# Patient Record
Sex: Female | Born: 1965 | Race: White | Hispanic: No | Marital: Married | Smoking: Never smoker
Health system: Southern US, Community
[De-identification: ages and names within clinical notes are randomized; demographics above are authoritative.]

## PROBLEM LIST (undated history)

## (undated) DIAGNOSIS — G629 Polyneuropathy, unspecified: Secondary | ICD-10-CM

## (undated) DIAGNOSIS — F319 Bipolar disorder, unspecified: Secondary | ICD-10-CM

## (undated) DIAGNOSIS — F329 Major depressive disorder, single episode, unspecified: Secondary | ICD-10-CM

## (undated) DIAGNOSIS — K311 Adult hypertrophic pyloric stenosis: Secondary | ICD-10-CM

## (undated) DIAGNOSIS — K9189 Other postprocedural complications and disorders of digestive system: Secondary | ICD-10-CM

## (undated) DIAGNOSIS — E43 Unspecified severe protein-calorie malnutrition: Secondary | ICD-10-CM

## (undated) DIAGNOSIS — R1110 Vomiting, unspecified: Secondary | ICD-10-CM

## (undated) DIAGNOSIS — E538 Deficiency of other specified B group vitamins: Secondary | ICD-10-CM

## (undated) DIAGNOSIS — E559 Vitamin D deficiency, unspecified: Secondary | ICD-10-CM

## (undated) DIAGNOSIS — R7303 Prediabetes: Secondary | ICD-10-CM

## (undated) DIAGNOSIS — Z8711 Personal history of peptic ulcer disease: Secondary | ICD-10-CM

## (undated) DIAGNOSIS — K315 Obstruction of duodenum: Secondary | ICD-10-CM

## (undated) DIAGNOSIS — M545 Low back pain, unspecified: Secondary | ICD-10-CM

## (undated) DIAGNOSIS — D75839 Thrombocytosis, unspecified: Secondary | ICD-10-CM

## (undated) DIAGNOSIS — G8929 Other chronic pain: Secondary | ICD-10-CM

## (undated) DIAGNOSIS — K257 Chronic gastric ulcer without hemorrhage or perforation: Secondary | ICD-10-CM

## (undated) DIAGNOSIS — K264 Chronic or unspecified duodenal ulcer with hemorrhage: Secondary | ICD-10-CM

## (undated) DIAGNOSIS — F4310 Post-traumatic stress disorder, unspecified: Secondary | ICD-10-CM

## (undated) DIAGNOSIS — E039 Hypothyroidism, unspecified: Secondary | ICD-10-CM

## (undated) DIAGNOSIS — F32A Depression, unspecified: Secondary | ICD-10-CM

## (undated) DIAGNOSIS — G43909 Migraine, unspecified, not intractable, without status migrainosus: Secondary | ICD-10-CM

## (undated) DIAGNOSIS — K432 Incisional hernia without obstruction or gangrene: Secondary | ICD-10-CM

## (undated) DIAGNOSIS — K219 Gastro-esophageal reflux disease without esophagitis: Secondary | ICD-10-CM

## (undated) DIAGNOSIS — Z8719 Personal history of other diseases of the digestive system: Secondary | ICD-10-CM

## (undated) DIAGNOSIS — N200 Calculus of kidney: Secondary | ICD-10-CM

## (undated) DIAGNOSIS — D509 Iron deficiency anemia, unspecified: Secondary | ICD-10-CM

## (undated) DIAGNOSIS — G894 Chronic pain syndrome: Secondary | ICD-10-CM

## (undated) HISTORY — DX: Migraine, unspecified, not intractable, without status migrainosus: G43.909

## (undated) HISTORY — DX: Chronic or unspecified duodenal ulcer with hemorrhage: K26.4

## (undated) HISTORY — DX: Polyneuropathy, unspecified: G62.9

## (undated) HISTORY — DX: Adult hypertrophic pyloric stenosis: K31.1

## (undated) HISTORY — DX: Obstruction of duodenum: K31.5

## (undated) HISTORY — DX: Depression, unspecified: F32.A

## (undated) HISTORY — PX: CHOLECYSTECTOMY: SHX55

## (undated) HISTORY — DX: Hypothyroidism, unspecified: E03.9

## (undated) HISTORY — DX: Chronic gastric ulcer without hemorrhage or perforation: K25.7

## (undated) HISTORY — PX: OTHER SURGICAL HISTORY: SHX169

## (undated) HISTORY — DX: Incisional hernia without obstruction or gangrene: K43.2

## (undated) HISTORY — DX: Vomiting, unspecified: R11.10

## (undated) HISTORY — DX: Major depressive disorder, single episode, unspecified: F32.9

## (undated) HISTORY — DX: Gastro-esophageal reflux disease without esophagitis: K21.9

## (undated) HISTORY — DX: Calculus of kidney: N20.0

## (undated) HISTORY — PX: TOOTH EXTRACTION: SUR596

---
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
## 2002-03-08 HISTORY — PX: ABDOMINAL HYSTERECTOMY: SHX81

## 2004-06-02 ENCOUNTER — Inpatient Hospital Stay: Payer: Self-pay | Admitting: Internal Medicine

## 2004-06-25 ENCOUNTER — Ambulatory Visit: Payer: Self-pay | Admitting: Urology

## 2005-09-23 ENCOUNTER — Ambulatory Visit: Payer: Self-pay | Admitting: Gastroenterology

## 2005-11-30 ENCOUNTER — Ambulatory Visit: Payer: Self-pay | Admitting: Gastroenterology

## 2006-01-17 ENCOUNTER — Ambulatory Visit: Payer: Self-pay | Admitting: Internal Medicine

## 2010-11-27 ENCOUNTER — Emergency Department: Payer: Self-pay | Admitting: Emergency Medicine

## 2013-03-22 ENCOUNTER — Encounter: Payer: Self-pay | Admitting: Family Medicine

## 2013-03-22 ENCOUNTER — Ambulatory Visit (INDEPENDENT_AMBULATORY_CARE_PROVIDER_SITE_OTHER): Payer: BC Managed Care – PPO | Admitting: Family Medicine

## 2013-03-22 ENCOUNTER — Other Ambulatory Visit (HOSPITAL_COMMUNITY)
Admission: RE | Admit: 2013-03-22 | Discharge: 2013-03-22 | Disposition: A | Discharge status: Home / Self Care | Payer: BC Managed Care – PPO | Source: Ambulatory Visit | Attending: Family Medicine | Admitting: Family Medicine

## 2013-03-22 VITALS — BP 104/68 | HR 84 | Temp 97.7°F | Ht 59.5 in | Wt 115.0 lb

## 2013-03-22 DIAGNOSIS — K264 Chronic or unspecified duodenal ulcer with hemorrhage: Secondary | ICD-10-CM | POA: Insufficient documentation

## 2013-03-22 DIAGNOSIS — G43909 Migraine, unspecified, not intractable, without status migrainosus: Secondary | ICD-10-CM | POA: Insufficient documentation

## 2013-03-22 DIAGNOSIS — E039 Hypothyroidism, unspecified: Secondary | ICD-10-CM

## 2013-03-22 DIAGNOSIS — F32A Depression, unspecified: Secondary | ICD-10-CM | POA: Insufficient documentation

## 2013-03-22 DIAGNOSIS — Z01419 Encounter for gynecological examination (general) (routine) without abnormal findings: Secondary | ICD-10-CM | POA: Insufficient documentation

## 2013-03-22 DIAGNOSIS — Z1151 Encounter for screening for human papillomavirus (HPV): Secondary | ICD-10-CM | POA: Insufficient documentation

## 2013-03-22 DIAGNOSIS — F3289 Other specified depressive episodes: Secondary | ICD-10-CM

## 2013-03-22 DIAGNOSIS — K219 Gastro-esophageal reflux disease without esophagitis: Secondary | ICD-10-CM | POA: Insufficient documentation

## 2013-03-22 DIAGNOSIS — Z136 Encounter for screening for cardiovascular disorders: Secondary | ICD-10-CM

## 2013-03-22 DIAGNOSIS — J069 Acute upper respiratory infection, unspecified: Secondary | ICD-10-CM

## 2013-03-22 DIAGNOSIS — F329 Major depressive disorder, single episode, unspecified: Secondary | ICD-10-CM | POA: Insufficient documentation

## 2013-03-22 DIAGNOSIS — Z1231 Encounter for screening mammogram for malignant neoplasm of breast: Secondary | ICD-10-CM

## 2013-03-22 DIAGNOSIS — R634 Abnormal weight loss: Secondary | ICD-10-CM

## 2013-03-22 LAB — TSH: TSH: 1.02 u[IU]/mL (ref 0.35–5.50)

## 2013-03-22 LAB — CBC WITH DIFFERENTIAL/PLATELET
Basophils Absolute: 0 10*3/uL (ref 0.0–0.1)
Basophils Relative: 0.3 % (ref 0.0–3.0)
Eosinophils Absolute: 0.1 10*3/uL (ref 0.0–0.7)
Eosinophils Relative: 1.3 % (ref 0.0–5.0)
HCT: 40.4 % (ref 36.0–46.0)
Hemoglobin: 13.5 g/dL (ref 12.0–15.0)
Lymphocytes Relative: 21.5 % (ref 12.0–46.0)
Lymphs Abs: 1.5 10*3/uL (ref 0.7–4.0)
MCHC: 33.4 g/dL (ref 30.0–36.0)
MCV: 88.7 fl (ref 78.0–100.0)
Monocytes Absolute: 0.4 10*3/uL (ref 0.1–1.0)
Monocytes Relative: 6.2 % (ref 3.0–12.0)
Neutro Abs: 5 10*3/uL (ref 1.4–7.7)
Neutrophils Relative %: 70.7 % (ref 43.0–77.0)
Platelets: 241 10*3/uL (ref 150.0–400.0)
RBC: 4.56 Mil/uL (ref 3.87–5.11)
RDW: 13.1 % (ref 11.5–14.6)
WBC: 7 10*3/uL (ref 4.5–10.5)

## 2013-03-22 LAB — COMPREHENSIVE METABOLIC PANEL
ALK PHOS: 67 U/L (ref 39–117)
ALT: 15 U/L (ref 0–35)
AST: 28 U/L (ref 0–37)
Albumin: 4 g/dL (ref 3.5–5.2)
BUN: 16 mg/dL (ref 6–23)
CALCIUM: 9.1 mg/dL (ref 8.4–10.5)
CHLORIDE: 102 meq/L (ref 96–112)
CO2: 33 mEq/L — ABNORMAL HIGH (ref 19–32)
Creatinine, Ser: 0.8 mg/dL (ref 0.4–1.2)
GFR: 77.82 mL/min (ref >60.00)
Glucose, Bld: 93 mg/dL (ref 70–99)
Potassium: 4.3 mEq/L (ref 3.5–5.1)
Sodium: 140 mEq/L (ref 135–145)
Total Bilirubin: 0.3 mg/dL (ref 0.3–1.2)
Total Protein: 7 g/dL (ref 6.0–8.3)

## 2013-03-22 LAB — LIPID PANEL
CHOL/HDL RATIO: 3
Cholesterol: 198 mg/dL (ref 0–200)
HDL: 71.9 mg/dL (ref >39.00)
LDL CALC: 107 mg/dL — AB (ref 0–99)
TRIGLYCERIDES: 96 mg/dL (ref 0.0–149.0)
VLDL: 19.2 mg/dL (ref 0.0–40.0)

## 2013-03-22 LAB — T4, FREE: FREE T4: 0.82 ng/dL (ref 0.60–1.60)

## 2013-03-22 MED ORDER — CITALOPRAM HYDROBROMIDE 40 MG PO TABS: 40.0000 mg | ORAL_TABLET | Freq: Every day | ORAL | Status: DC

## 2013-03-22 MED ORDER — VENLAFAXINE HCL 75 MG PO TABS: 75.0000 mg | ORAL_TABLET | Freq: Two times a day (BID) | ORAL | Status: DC

## 2013-03-22 NOTE — Assessment & Plan Note (Signed)
Prn tramadol.

## 2013-03-22 NOTE — Assessment & Plan Note (Signed)
Check labs 

## 2013-03-22 NOTE — Assessment & Plan Note (Signed)
Symptoms started this morning.  Advised supportive care.  She will call office if symptoms persist beyond expected viral illness.

## 2013-03-22 NOTE — Patient Instructions (Addendum)
We are increasing your Effexor to 75 mg twice daily.  Please call [facility] to set up your mammogram.  We will call you with your lab results.  Please call me in a few weeks with an update of your symptoms.

## 2013-03-22 NOTE — Assessment & Plan Note (Signed)
Deteriorated. Increase effexor to 75 mg twice daily.  Discussed risk of serotonin syndrome. She will update me with symptoms.

## 2013-03-22 NOTE — Assessment & Plan Note (Signed)
?  thyroid not regulated.  Depression likely playing a roll as well. Will check labs today.
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
## 2013-03-22 NOTE — Progress Notes (Signed)
Pre-visit discussion using our clinic review tool. No additional management support is needed unless otherwise documented below in the visit note.  

## 2013-03-22 NOTE — Assessment & Plan Note (Signed)
Reviewed preventive care protocols, scheduled due services, and updated immunizations Discussed nutrition, exercise, diet, and healthy lifestyle.  

## 2013-03-23 ENCOUNTER — Telehealth: Payer: Self-pay

## 2013-03-23 NOTE — Telephone Encounter (Signed)
[name] at [facility] left v/m to verify prescription for effexor. Tanya Nguyen had rx transferred from [place] for effexor immediate release; [name] said pt used to take effexor XR. [name] wants to verify if pt should be taking effexor XR or effexor immediate release.Please advise.

## 2013-03-26 ENCOUNTER — Encounter: Payer: Self-pay | Admitting: *Deleted

## 2013-03-26 NOTE — Telephone Encounter (Signed)
See prev note

## 2013-03-26 NOTE — Telephone Encounter (Signed)
Her chart says immed release... Also she is Dr Hulen Shouts pt -may want to check with her

## 2013-03-26 NOTE — Telephone Encounter (Signed)
Chart says immediate release but I would call pt to clarify with her.

## 2013-03-27 NOTE — Telephone Encounter (Signed)
Lm on pharmacy's vm. When entering medications, pt was unaware of what type effexor she was taking. We entered it based on what she thought it was. During her appt, i contacted [name] to confirm what other medications pt was taking because she was unable to remember all of them. The original Rx that [name] has on file, which is effexor XR, is what the pt should be taking and should be refilled as such. I informed them if they had any additional questions, they could contact me directly for verification

## 2013-03-29 ENCOUNTER — Encounter: Payer: Self-pay | Admitting: Family Medicine

## 2013-03-29 ENCOUNTER — Ambulatory Visit (INDEPENDENT_AMBULATORY_CARE_PROVIDER_SITE_OTHER): Payer: BC Managed Care – PPO | Admitting: Family Medicine

## 2013-03-29 VITALS — BP 102/60 | HR 86 | Temp 97.5°F | Ht 59.5 in | Wt 117.0 lb

## 2013-03-29 DIAGNOSIS — R42 Dizziness and giddiness: Secondary | ICD-10-CM | POA: Insufficient documentation

## 2013-03-29 DIAGNOSIS — J069 Acute upper respiratory infection, unspecified: Secondary | ICD-10-CM

## 2013-03-29 NOTE — Assessment & Plan Note (Signed)
Viral and improving.  No signs of worsening infection. Exam benign and reassuring.  No further tx other than to d/c cheratussin or only take at night prn.  The patient indicates understanding of these issues and agrees with the plan.

## 2013-03-29 NOTE — Progress Notes (Signed)
Pre-visit discussion using our clinic review tool. No additional management support is needed unless otherwise documented below in the visit note.  

## 2013-03-29 NOTE — Assessment & Plan Note (Signed)
Likely due to sedation from cheratussin. Advised to d/c or to only take at night as needed. The patient indicates understanding of these issues and agrees with the plan.

## 2013-03-29 NOTE — Patient Instructions (Signed)
Please only take Cheratussin at night time. Your lungs sound very good today. You don't need an antibiotic at this point.   If your symptoms worsen, please let me know.

## 2013-03-29 NOTE — Progress Notes (Signed)
   Subjective:    Patient ID: Tanya Nguyen, female    DOB: March 23, 1965, 48 y.o.   MRN: [id number]  HPI  48 yo here for follow up URI.  Started to develop runny nose, cough, sore throat last week. Went to UC on Sunday.  Diagnosed with viral infection and placed on flonase and cheratussin three times daily.  Cough is improving but feels very fatigued and sleepy.  No fevers or chills.  Patient Active Problem List   Diagnosis Date Noted  . Dizziness and giddiness 03/29/2013  . Acute upper respiratory infections of unspecified site 03/22/2013  . Loss of weight 03/22/2013  . GERD (gastroesophageal reflux disease)   . Migraine headache   . Hypothyroidism   . Depression   . Bleeding duodenal ulcer    Past Medical History  Diagnosis Date  . GERD (gastroesophageal reflux disease)   . Migraine headache   . Hypothyroidism   . Depression   . Bleeding duodenal ulcer   . Kidney stones    Past Surgical History  Procedure Laterality Date  . Abdominal hysterectomy  2004    partial  . Cholecystectomy    . Tooth extraction     History  Substance Use Topics  . Smoking status: Never Smoker   . Smokeless tobacco: Never Used  . Alcohol Use: Yes   Family History  Problem Relation Age of Onset  . Cancer Mother   . Cancer Father   . Alcohol abuse Sister   . Alcohol abuse Maternal Grandmother   . Stroke Maternal Grandmother   . Stroke Paternal Grandmother    Allergies  Allergen Reactions  . Sulfa Antibiotics Nausea And Vomiting   Current Outpatient Prescriptions on File Prior to Visit  Medication Sig Dispense Refill  . cetirizine (ZYRTEC) 10 MG tablet Take 10 mg by mouth daily.      . citalopram (CELEXA) 40 MG tablet Take 1 tablet (40 mg total) by mouth daily.  30 tablet  3  . levothyroxine (SYNTHROID, LEVOTHROID) 75 MCG tablet Take 75 mcg by mouth daily before breakfast.      . omeprazole (PRILOSEC) 40 MG capsule Take 40 mg by mouth daily.      . traMADol (ULTRAM) 50 MG tablet  Take 50 mg by mouth every 6 (six) hours as needed (as needed for headaches).      . traZODone (DESYREL) 50 MG tablet Take 50 mg by mouth at bedtime.      Marland Kitchen venlafaxine (EFFEXOR) 75 MG tablet Take 1 tablet (75 mg total) by mouth 2 (two) times daily.  60 tablet  3   No current facility-administered medications on file prior to visit.   The PMH, PSH, Social History, Family History, Medications, and allergies have been reviewed in [facility], and have been updated if relevant.   Review of Systems    See HPI No wheezing No SOB Objective:   Physical Exam BP 102/60  Pulse 86  Temp(Src) 97.5 F (36.4 C) (Oral)  Ht 4' 11.5" (1.511 m)  Wt 117 lb (53.071 kg)  BMI 23.24 kg/m2  SpO2 99%  Gen:  Alert, pleasant, NAD HEENT:  +pharyngeal erythema, no exudate Resp:  CTA bilaterally CVS:  RRR Ext:  No edema    Assessment & Plan:

## 2013-04-12 ENCOUNTER — Other Ambulatory Visit: Payer: Self-pay | Admitting: *Deleted

## 2013-04-12 MED ORDER — TRAMADOL HCL 50 MG PO TABS: 50.0000 mg | ORAL_TABLET | Freq: Four times a day (QID) | ORAL | Status: DC | PRN

## 2013-04-12 NOTE — Telephone Encounter (Signed)
Pt requesting medication refill. Last ov 03/29/13 with f/u appt 06/20/13. pls advise

## 2013-04-12 NOTE — Telephone Encounter (Signed)
Lm on pts vm informing her Rx has been sent to requested pharmacy 

## 2013-05-02 ENCOUNTER — Telehealth: Payer: Self-pay

## 2013-05-02 NOTE — Telephone Encounter (Signed)
Pt wanted to let office know has changed pharmacy to [facility]. Advised pt when need refill have pharmacy contact office. Pt voiced understanding.

## 2013-05-04 ENCOUNTER — Other Ambulatory Visit: Payer: Self-pay

## 2013-05-04 MED ORDER — LEVOTHYROXINE SODIUM 75 MCG PO TABS: 75.0000 ug | ORAL_TABLET | Freq: Every day | ORAL | Status: DC

## 2013-05-04 NOTE — Telephone Encounter (Signed)
Pt left v/m requesting refill levothyroxine to Tanya Nguyen; cannot see if Dr Deborra Medina has filled before.Please advise. Pt is out of med.

## 2013-05-18 ENCOUNTER — Other Ambulatory Visit: Payer: Self-pay | Admitting: Family Medicine

## 2013-05-18 NOTE — Telephone Encounter (Signed)
Pt requesting medication refill. Last ov 03/2013 with future appt sched for 06/2013. pls advise

## 2013-05-21 NOTE — Telephone Encounter (Signed)
Ok to refill one time only but if she needed to take this regularly, I need to see her to discuss her headaches.

## 2013-05-21 NOTE — Telephone Encounter (Signed)
Spoke to pt and informed her Rx has been called in to requested pharmacy. F/u appt sched for 05/30/13.

## 2013-05-23 ENCOUNTER — Ambulatory Visit: Payer: Self-pay | Admitting: Family Medicine

## 2013-05-24 ENCOUNTER — Encounter: Payer: Self-pay | Admitting: Family Medicine

## 2013-05-30 ENCOUNTER — Ambulatory Visit: Payer: BC Managed Care – PPO | Admitting: Family Medicine

## 2013-06-06 ENCOUNTER — Ambulatory Visit: Payer: Self-pay | Admitting: Family Medicine

## 2013-06-06 ENCOUNTER — Encounter: Payer: Self-pay | Admitting: Family Medicine

## 2013-06-20 ENCOUNTER — Ambulatory Visit: Payer: BC Managed Care – PPO | Admitting: Family Medicine

## 2013-07-06 ENCOUNTER — Other Ambulatory Visit: Payer: Self-pay | Admitting: Family Medicine

## 2013-07-06 NOTE — Telephone Encounter (Signed)
Last filled 05/18/13

## 2013-07-07 MED ORDER — TRAMADOL HCL 50 MG PO TABS: ORAL_TABLET | ORAL | Status: DC

## 2013-07-09 ENCOUNTER — Other Ambulatory Visit: Payer: Self-pay

## 2013-07-09 MED ORDER — TRAZODONE HCL 50 MG PO TABS: 50.0000 mg | ORAL_TABLET | Freq: Every day | ORAL | Status: DC

## 2013-07-09 NOTE — Telephone Encounter (Signed)
Pt said on 07/06/13 pt requested refill on trazodone but tramadol was sent to pharmacy. Pt has picked up the tramadol and now request refill on Trazodone to [facility]. Pt request cb when refilled.

## 2013-07-09 NOTE — Telephone Encounter (Signed)
Left v/m on pts phone that refill had been sent to [facility].

## 2013-07-09 NOTE — Telephone Encounter (Signed)
Called to Tanya Nguyen. 

## 2013-08-14 ENCOUNTER — Other Ambulatory Visit: Payer: Self-pay | Admitting: Family Medicine

## 2013-08-14 NOTE — Telephone Encounter (Signed)
Pt left v/m requesting refill tramadol and trazodone to [facility]. Pt is out of both meds. Pt has appt scheduled 08/30/13.Please advise.Marland Kitchen

## 2013-08-14 NOTE — Telephone Encounter (Signed)
Pt requesting medication refill. Last f/u appt 03/2013 with 08/2013 appt sched. pls advise

## 2013-08-14 NOTE — Telephone Encounter (Signed)
Spoke to pt and informed her Rx has been called in to requested pharmacy 

## 2013-08-26 ENCOUNTER — Emergency Department: Payer: Self-pay | Admitting: Emergency Medicine

## 2013-08-26 LAB — URINALYSIS, COMPLETE
BACTERIA: NONE SEEN
BILIRUBIN, UR: NEGATIVE
BLOOD: NEGATIVE
Glucose,UR: NEGATIVE mg/dL (ref 0–75)
Ketone: NEGATIVE
Leukocyte Esterase: NEGATIVE
NITRITE: NEGATIVE
Ph: 7 (ref 4.5–8.0)
Protein: NEGATIVE
Specific Gravity: 1.014 (ref 1.003–1.030)

## 2013-08-27 ENCOUNTER — Telehealth: Payer: Self-pay | Admitting: Family Medicine

## 2013-08-27 NOTE — Telephone Encounter (Signed)
Patient Information:  [name] Name: Majesty  Phone: [phone]  Patient: Tanya Nguyen, Tanya Nguyen  Gender: Female  DOB: [birth year]  Age: 48 Years  PCP: Arnette Norris [facility])  Pregnant: No  Office Follow Up:  Does the office need to follow up with this patient?: Yes  Instructions For The Office: Ongoing new migraines "8"/10, has been found on floor by husband multiple times and she states the Dr. is unaware of this. Please call to schedule appointment. RN/CAN advised ER, she refused due to cost.  RN Note:  Afebrile. Onset of Migraine headaches and is not managed with Hospital  visit yesterday, 08/26/2013. Head is throbbing, husband has found on the floor a few times she has crawled there. She states this is the worst headache, "8"/10 and has not taken the medication yet the Dr. advised. RN/CAN gave ER advice based on triage questions, she refused due to cost and Shannon at office aware and states no appointments possible and ok to use ER. Jeanae is going to try medication first. She needs an appointment since Dr. is not aware of her ongoing, new migraines.   Symptoms  Reason For Call & Symptoms: Migraine - was seen in the Hospital who gave Ketorolac 10mg   Reviewed Health History In EMR: Yes  Reviewed Medications In EMR: Yes  Reviewed Allergies In EMR: Yes  Reviewed Surgeries / Procedures: Yes  Date of Onset of Symptoms: 08/07/2013  Treatments Tried: medication trazadone  Treatments Tried Worked: No OB / GYN:  LMP: Unknown  Guideline(s) Used:  Headache  Disposition Per Guideline:   Go to ED Now (or to Office with PCP Approval)  Reason For Disposition Reached:   Severe headache, states "worst headache" of life  Advice Given:  N/A  RN Overrode Recommendation:  Follow Up With Office Later  RN/CAN called the office, "Larene Beach" states she cannot be worked in the office is 100%  and aware [facility] refused the ER disposition due to cost.  She will have her husband drive to pharmacy and  pick up medication that was advised by hospital and see if it is effective. She is aware not to be alone due to fall precautions and states her Mom is with her.

## 2013-08-27 NOTE — Telephone Encounter (Signed)
Spoke to pt and advised per Dr Aron. Pt verbally expressed understanding.  

## 2013-08-27 NOTE — Telephone Encounter (Signed)
Yes she absolutely needs to go to ER or at least an urgent care where they can transfer her to ER if necessary.

## 2013-08-30 ENCOUNTER — Ambulatory Visit: Payer: BC Managed Care – PPO | Admitting: Family Medicine

## 2013-08-30 ENCOUNTER — Encounter: Payer: Self-pay | Admitting: Family Medicine

## 2013-08-30 ENCOUNTER — Ambulatory Visit (INDEPENDENT_AMBULATORY_CARE_PROVIDER_SITE_OTHER): Payer: BC Managed Care – PPO | Admitting: Family Medicine

## 2013-08-30 VITALS — BP 104/68 | HR 99 | Temp 98.0°F | Wt 121.0 lb

## 2013-08-30 DIAGNOSIS — G4700 Insomnia, unspecified: Secondary | ICD-10-CM | POA: Insufficient documentation

## 2013-08-30 DIAGNOSIS — N390 Urinary tract infection, site not specified: Secondary | ICD-10-CM | POA: Insufficient documentation

## 2013-08-30 DIAGNOSIS — N3001 Acute cystitis with hematuria: Secondary | ICD-10-CM

## 2013-08-30 DIAGNOSIS — N3000 Acute cystitis without hematuria: Secondary | ICD-10-CM

## 2013-08-30 DIAGNOSIS — R300 Dysuria: Secondary | ICD-10-CM

## 2013-08-30 LAB — POCT URINALYSIS DIPSTICK
Bilirubin, UA: NEGATIVE
GLUCOSE UA: NEGATIVE
Ketones, UA: NEGATIVE
Nitrite, UA: NEGATIVE
PROTEIN UA: NEGATIVE
SPEC GRAV UA: 1.01
Urobilinogen, UA: 0.2
pH, UA: 7

## 2013-08-30 MED ORDER — CITALOPRAM HYDROBROMIDE 40 MG PO TABS: 40.0000 mg | ORAL_TABLET | Freq: Every day | ORAL | Status: DC

## 2013-08-30 MED ORDER — ZOLPIDEM TARTRATE 5 MG PO TABS: 5.0000 mg | ORAL_TABLET | Freq: Every evening | ORAL | Status: DC | PRN

## 2013-08-30 MED ORDER — VENLAFAXINE HCL 75 MG PO TABS: 75.0000 mg | ORAL_TABLET | Freq: Two times a day (BID) | ORAL | Status: DC

## 2013-08-30 MED ORDER — CIPROFLOXACIN HCL 500 MG PO TABS: 500.0000 mg | ORAL_TABLET | Freq: Two times a day (BID) | ORAL | Status: DC
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
## 2013-08-30 NOTE — Assessment & Plan Note (Signed)
UA pos for LE and RBC. Send urine for cx. Treat with cipro 500 mg twice daily x 3 days.

## 2013-08-30 NOTE — Progress Notes (Signed)
Pre visit review using our clinic review tool, if applicable. No additional management support is needed unless otherwise documented below in the visit note. 

## 2013-08-30 NOTE — Assessment & Plan Note (Signed)
>  25 min spent with patient, at least half of which was spent on counseling insomnia.  The problem of recurrent insomnia is discussed. Avoidance of caffeine sources is strongly encouraged. Sleep hygiene issues are reviewed. The use of sedative hypnotics for temporary relief is appropriate; we discussed the addictive nature of these drugs, and a one-time only prescription for prn use of a hypnotic is given, to use no more than 3 times per week for 2-3 weeks.   D/c trazadone (and tramadol).

## 2013-08-30 NOTE — Patient Instructions (Addendum)
Please stop taking Tramadol and Trazodone. Take cipro as directed- 1 tablet twice daily for 3 days.  Ambien as needed (not every night) for sleep.  Try Tylenol PM or Benadryl for sleep first.  Try zyrtec, claritin, or allegra for your sinuses.

## 2013-08-31 LAB — URINE CULTURE
Colony Count: NO GROWTH
ORGANISM ID, BACTERIA: NO GROWTH

## 2013-09-10 ENCOUNTER — Other Ambulatory Visit: Payer: Self-pay

## 2013-09-10 MED ORDER — TRAZODONE HCL 50 MG PO TABS: ORAL_TABLET | ORAL | Status: DC

## 2013-09-10 NOTE — Addendum Note (Signed)
Addended by: Modena Nunnery on: 09/10/2013 04:27 PM   Modules accepted: Orders

## 2013-09-10 NOTE — Telephone Encounter (Signed)
Spoke to pt and informed her Rx has been sent to requested pharmacy 

## 2013-09-10 NOTE — Telephone Encounter (Signed)
Pt left v/m requesting refill trazodone to [facility];  Pt does not think trazodone was causing h/as; pt thinks allergies and sinus problem caused h/a. pt is not resting at night;pt has problems getting to sleep, pt goes to sleep around 3-4AM pt said [place] does not help pt sleep as well as trazodone. Pt request cb when trazodone called to pharmacy.

## 2013-10-12 ENCOUNTER — Other Ambulatory Visit: Payer: Self-pay | Admitting: Family Medicine

## 2013-11-09 ENCOUNTER — Other Ambulatory Visit: Payer: Self-pay

## 2013-11-09 MED ORDER — TRAZODONE HCL 50 MG PO TABS
ORAL_TABLET | ORAL | Status: DC
Start: 2013-11-09 — End: 2013-12-10

## 2013-11-09 NOTE — Telephone Encounter (Signed)
Pt left v/m requesting refill trazodone to [facility]; pt will be out of med on Mon 11/12/13.Please advise.pt request cb.

## 2013-11-09 NOTE — Telephone Encounter (Signed)
Rx called in to requested pharmacy 

## 2013-11-13 ENCOUNTER — Other Ambulatory Visit: Payer: Self-pay | Admitting: Family Medicine

## 2013-11-13 MED ORDER — OMEPRAZOLE 40 MG PO CPDR: 40.0000 mg | DELAYED_RELEASE_CAPSULE | Freq: Every day | ORAL | Status: DC

## 2013-12-07 ENCOUNTER — Other Ambulatory Visit: Payer: Self-pay

## 2013-12-07 MED ORDER — LEVOTHYROXINE SODIUM 75 MCG PO TABS: 75.0000 ug | ORAL_TABLET | Freq: Every day | ORAL | Status: DC

## 2013-12-07 NOTE — Telephone Encounter (Signed)
Pt request refill levothyroxine to [facility] [address]. Pt already has appt to see Dr Deborra Medina next week due to feeling tired. Advised pt refill done.

## 2013-12-10 ENCOUNTER — Other Ambulatory Visit: Payer: Self-pay | Admitting: Family Medicine

## 2013-12-13 ENCOUNTER — Ambulatory Visit (INDEPENDENT_AMBULATORY_CARE_PROVIDER_SITE_OTHER): Payer: BC Managed Care – PPO | Admitting: Family Medicine

## 2013-12-13 ENCOUNTER — Encounter: Payer: Self-pay | Admitting: Family Medicine

## 2013-12-13 VITALS — BP 130/78 | HR 70 | Temp 97.7°F | Wt 118.0 lb

## 2013-12-13 DIAGNOSIS — R5383 Other fatigue: Secondary | ICD-10-CM

## 2013-12-13 DIAGNOSIS — Z23 Encounter for immunization: Secondary | ICD-10-CM

## 2013-12-13 DIAGNOSIS — R531 Weakness: Secondary | ICD-10-CM | POA: Insufficient documentation

## 2013-12-13 LAB — CBC WITH DIFFERENTIAL/PLATELET
BASOS ABS: 0 10*3/uL (ref 0.0–0.1)
BASOS PCT: 0.5 % (ref 0.0–3.0)
EOS ABS: 0.1 10*3/uL (ref 0.0–0.7)
Eosinophils Relative: 2 % (ref 0.0–5.0)
HCT: 43.6 % (ref 36.0–46.0)
Hemoglobin: 14.2 g/dL (ref 12.0–15.0)
LYMPHS PCT: 31.9 % (ref 12.0–46.0)
Lymphs Abs: 1.4 10*3/uL (ref 0.7–4.0)
MCHC: 32.5 g/dL (ref 30.0–36.0)
MCV: 91.8 fl (ref 78.0–100.0)
MONOS PCT: 6.3 % (ref 3.0–12.0)
Monocytes Absolute: 0.3 10*3/uL (ref 0.1–1.0)
NEUTROS PCT: 59.3 % (ref 43.0–77.0)
Neutro Abs: 2.5 10*3/uL (ref 1.4–7.7)
Platelets: 270 10*3/uL (ref 150.0–400.0)
RBC: 4.75 Mil/uL (ref 3.87–5.11)
RDW: 13.1 % (ref 11.5–15.5)
WBC: 4.3 10*3/uL (ref 4.0–10.5)

## 2013-12-13 LAB — TSH: TSH: 0.25 u[IU]/mL — AB (ref 0.35–4.50)

## 2013-12-13 LAB — VITAMIN D 25 HYDROXY (VIT D DEFICIENCY, FRACTURES): VITD: 36.1 ng/mL (ref 30.00–100.00)

## 2013-12-13 LAB — T4, FREE: Free T4: 1.17 ng/dL (ref 0.60–1.60)

## 2013-12-13 LAB — VITAMIN B12: Vitamin B-12: 270 pg/mL (ref 211–911)

## 2013-12-13 MED ORDER — LEVOTHYROXINE SODIUM 50 MCG PO TABS: 50.0000 ug | ORAL_TABLET | Freq: Every day | ORAL | Status: DC

## 2013-12-13 NOTE — Patient Instructions (Signed)
Great to see you. I will call you with your lab results.   

## 2013-12-13 NOTE — Assessment & Plan Note (Signed)
New- with h/o depression and hypothyroidism. >25 minutes spent in face to face time with patient, >50% spent in counselling or coordination of care. She wants to adjust some her medications but advised checking lab work first. The patient indicates understanding of these issues and agrees with the plan. Orders Placed This Encounter  Procedures  . Flu Vaccine QUAD 36+ mos PF IM (Fluarix Quad PF)  . TSH  . T4, Free  . CBC with Differential  . Vitamin B12  . Vitamin D, 25-hydroxy

## 2013-12-13 NOTE — Progress Notes (Signed)
Pre visit review using our clinic review tool, if applicable. No additional management support is needed unless otherwise documented below in the visit note. 

## 2013-12-13 NOTE — Addendum Note (Signed)
Addended by: Lucille Passy on: 12/13/2013 06:04 PM   Modules accepted: Orders

## 2013-12-13 NOTE — Progress Notes (Signed)
Subjective:    Patient ID: Tanya Nguyen, female    DOB: 08-28-1965, 48 y.o.   MRN: [id number]  HPI  Here for progressive fatigue.  Ongoing for months. Sleeps ok with trazodone. Does not feel more depressed unless she is at home for long periods of time because she worries about her mom.  Takes synthroid 75 mcg daily.  Lab Results  Component Value Date   TSH 1.02 03/22/2013   Has had more constipation. Denies cold intolerance.  Lab Results  Component Value Date   WBC 7.0 03/22/2013   HGB 13.5 03/22/2013   HCT 40.4 03/22/2013   MCV 88.7 03/22/2013   PLT 241.0 03/22/2013   Current Outpatient Prescriptions on File Prior to Visit  Medication Sig Dispense Refill  . cetirizine (ZYRTEC) 10 MG tablet Take 10 mg by mouth daily.      . citalopram (CELEXA) 40 MG tablet Take 1 tablet (40 mg total) by mouth daily.  30 tablet  3  . flavoxATE (URISPAS) 100 MG tablet Take 100 mg by mouth 3 (three) times daily as needed for bladder spasms.      Marland Kitchen ketorolac (TORADOL) 10 MG tablet Take 10 mg by mouth every 6 (six) hours as needed.      Marland Kitchen levothyroxine (SYNTHROID, LEVOTHROID) 75 MCG tablet Take 1 tablet (75 mcg total) by mouth daily before breakfast.  30 tablet  0  . omeprazole (PRILOSEC) 40 MG capsule Take 1 capsule (40 mg total) by mouth daily.  30 capsule  5  . traZODone (DESYREL) 50 MG tablet TAKE ONE TABLET AT BEDTIME  30 tablet  1  . venlafaxine (EFFEXOR) 75 MG tablet Take 1 tablet (75 mg total) by mouth 2 (two) times daily.  60 tablet  3   No current facility-administered medications on file prior to visit.    Allergies  Allergen Reactions  . Sulfa Antibiotics Nausea And Vomiting    Past Medical History  Diagnosis Date  . GERD (gastroesophageal reflux disease)   . Migraine headache   . Hypothyroidism   . Depression   . Bleeding duodenal ulcer   . Kidney stones     Past Surgical History  Procedure Laterality Date  . Abdominal hysterectomy  2004    partial  . Cholecystectomy     . Tooth extraction      Family History  Problem Relation Age of Onset  . Cancer Mother   . Cancer Father   . Alcohol abuse Sister   . Alcohol abuse Maternal Grandmother   . Stroke Maternal Grandmother   . Stroke Paternal Grandmother     History   Social History  . Marital Status: Married    Spouse Name: N/A    Number of Children: N/A  . Years of Education: N/A   Occupational History  . Not on file.   Social History Main Topics  . Smoking status: Never Smoker   . Smokeless tobacco: Never Used  . Alcohol Use: Yes  . Drug Use: No  . Sexual Activity: Yes   Other Topics Concern  . Not on file   Social History Narrative  . No narrative on file   The PMH, PSH, Social History, Family History, Medications, and allergies have been reviewed in [facility], and have been updated if relevant.   Review of Systems  Constitutional: Positive for activity change and fatigue. Negative for fever, chills, diaphoresis and appetite change.  HENT: Negative.   Eyes: Negative.   Respiratory: Negative.   Cardiovascular: Negative.  Gastrointestinal: Negative.   Endocrine: Negative.   Genitourinary: Negative.   Psychiatric/Behavioral: The patient is nervous/anxious.   All other systems reviewed and are negative.      Objective:   Physical Exam  Nursing note and vitals reviewed. Constitutional: She is oriented to person, place, and time. She appears well-developed and well-nourished. No distress.  HENT:  Head: Normocephalic and atraumatic.  Eyes: Pupils are equal, round, and reactive to light.  Neck: Normal range of motion. Neck supple. No thyromegaly present.  Cardiovascular: Normal rate, regular rhythm and normal heart sounds.   Pulmonary/Chest: Effort normal and breath sounds normal.  Abdominal: Soft. Bowel sounds are normal.  Neurological: She is alert and oriented to person, place, and time.  Skin: Skin is warm and dry.  Psychiatric: She has a normal mood and affect. Her behavior  is normal. Judgment and thought content normal.   BP 130/78  Pulse 70  Temp(Src) 97.7 F (36.5 C) (Oral)  Wt 118 lb (53.524 kg)  SpO2 98%       Assessment & Plan:

## 2014-01-03 ENCOUNTER — Encounter: Payer: Self-pay | Admitting: Family Medicine

## 2014-01-03 ENCOUNTER — Ambulatory Visit (INDEPENDENT_AMBULATORY_CARE_PROVIDER_SITE_OTHER): Payer: BC Managed Care – PPO | Admitting: Family Medicine

## 2014-01-03 VITALS — Ht 59.5 in | Wt 115.5 lb

## 2014-01-03 DIAGNOSIS — R1030 Lower abdominal pain, unspecified: Secondary | ICD-10-CM

## 2014-01-03 DIAGNOSIS — R300 Dysuria: Secondary | ICD-10-CM

## 2014-01-03 DIAGNOSIS — R319 Hematuria, unspecified: Secondary | ICD-10-CM

## 2014-01-03 DIAGNOSIS — M549 Dorsalgia, unspecified: Secondary | ICD-10-CM

## 2014-01-03 LAB — POCT URINALYSIS DIPSTICK
BILIRUBIN UA: NEGATIVE
Glucose, UA: NEGATIVE
Leukocytes, UA: NEGATIVE
Nitrite, UA: NEGATIVE
PH UA: 5.5
Urobilinogen, UA: 0.2

## 2014-01-03 LAB — POCT UA - MICROSCOPIC ONLY
CASTS, UR, LPF, POC: NEGATIVE
Crystals, Ur, HPF, POC: NEGATIVE
Yeast, UA: NEGATIVE

## 2014-01-03 MED ORDER — HYDROCODONE-ACETAMINOPHEN 5-325 MG PO TABS: 1.0000 | ORAL_TABLET | Freq: Four times a day (QID) | ORAL | Status: DC | PRN

## 2014-01-03 MED ORDER — TAMSULOSIN HCL 0.4 MG PO CAPS: 0.4000 mg | ORAL_CAPSULE | Freq: Every day | ORAL | Status: DC

## 2014-01-03 MED ORDER — CIPROFLOXACIN HCL 500 MG PO TABS: 500.0000 mg | ORAL_TABLET | Freq: Two times a day (BID) | ORAL | Status: DC

## 2014-01-03 NOTE — Progress Notes (Signed)
Dr. Frederico Hamman T. Nathalya Wolanski, MD, [facility] [place], [address] Phone: [phone] Fax: [phone]  01/03/2014  Patient: Tanya Nguyen, MRN: [id number], DOB: [birth year], 48 y.o.  Primary Physician:  Arnette Norris, MD  Chief Complaint: Abdominal Pain and Back Pain  Subjective:   Tanya Nguyen is a 47 y.o. very pleasant female patient who presents with the following:  The patient presents with multiple  Problems today. She has abdominal pain, dysuria,  Some pain in her back.  She also has urgency. Abdominal pain, very uncomfortable. Some pain in the lower back. Some pain in the bladder region, where it is hurting more.   S/p hysterectomy. S/p cholecystectomy.   Has not eaten since yesterday at 5 PM. BM, this morning. She is able to drink. Having some urination, also some urgency. Dribble. Smell bad in urine.   Probable stone - urine micro-with blood.  Past Medical History, Surgical History, Social History, Family History, Problem List, Medications, and Allergies have been reviewed and updated if relevant.   GEN: as above GI: as above Pulm: No SOB Interactive and getting along well at home.  Otherwise, ROS is as per the HPI.  Objective:   Ht 4' 11.5" (1.511 m)  Wt 115 lb 8 oz (52.39 kg)  BMI 22.95 kg/m2  GEN: WDWN, NAD, Non-toxic, A & O x 3 HEENT: Atraumatic, Normocephalic. Neck supple. No masses, No LAD. Ears and Nose: No external deformity. CV: RRR, No M/G/R. No JVD. No thrill. No extra heart sounds. PULM: CTA B, no wheezes, crackles, rhonchi. No retractions. No resp. distress. No accessory muscle use. ABD: S, mild-mod hypogastric abdominal pain. No CVAT., ND, + BS, No rebound, No HSM  EXTR: No c/c/e NEURO Normal gait.  PSYCH: Normally interactive. Conversant. Not depressed or anxious appearing.  Calm demeanor.   Laboratory and Imaging Data: Results for orders placed in visit on 01/03/14  POCT UA -  MICROSCOPIC ONLY      Result Value Ref Range   WBC, Ur, HPF, POC 0-3     RBC, urine, microscopic 10-20     Bacteria, U Microscopic trace     Mucus, UA 1+     Epithelial cells, urine per micros 0-3     Crystals, Ur, HPF, POC negative     Casts, Ur, LPF, POC negative     Yeast, UA negative    POCT URINALYSIS DIPSTICK      Result Value Ref Range   Color, UA yellow     Clarity, UA clear     Glucose, UA negative     Bilirubin, UA negative     Ketones, UA 2+     Spec Grav, UA >=1.030     Blood, UA large     pH, UA 5.5     Protein, UA trace     Urobilinogen, UA 0.2     Nitrite, UA negative     Leukocytes, UA Negative       Assessment and Plan:   Lower abdominal pain - Plan: CBC with Differential, Hepatic function panel, Lipase  Back pain, unspecified location - Plan: POCT UA - Microscopic Only, POCT urinalysis dipstick  Hematuria - Plan: Urine culture  Dysuria - Plan: CBC with Differential, Hepatic function panel, Lipase  Most likely, nephrolithiasis.  Treat as such.  Significant dysuria and frequency.  Also place the patient on antibiot.  Pain medication and Flomax. With abdominal pain, also check additional labs.  Strict  precautions reviewed.  Follow-up: No Follow-up on file.  New Prescriptions   CIPROFLOXACIN (CIPRO) 500 MG TABLET    Take 1 tablet (500 mg total) by mouth 2 (two) times daily.   HYDROCODONE-ACETAMINOPHEN (NORCO/VICODIN) 5-325 MG PER TABLET    Take 1 tablet by mouth every 6 (six) hours as needed for moderate pain.   TAMSULOSIN (FLOMAX) 0.4 MG CAPS CAPSULE    Take 1 capsule (0.4 mg total) by mouth daily.   Orders Placed This Encounter  Procedures  . Urine culture  . CBC with Differential  . Hepatic function panel  . Lipase  . POCT UA - Microscopic Only  . POCT urinalysis dipstick    Signed,  Frederico Hamman T. Kaitlyne Friedhoff, MD   Patient's Medications  New Prescriptions   CIPROFLOXACIN (CIPRO) 500 MG TABLET    Take 1 tablet (500 mg total) by mouth 2 (two)  times daily.   HYDROCODONE-ACETAMINOPHEN (NORCO/VICODIN) 5-325 MG PER TABLET    Take 1 tablet by mouth every 6 (six) hours as needed for moderate pain.   TAMSULOSIN (FLOMAX) 0.4 MG CAPS CAPSULE    Take 1 capsule (0.4 mg total) by mouth daily.  Previous Medications   CETIRIZINE (ZYRTEC) 10 MG TABLET    Take 10 mg by mouth daily.   CITALOPRAM (CELEXA) 40 MG TABLET    Take 1 tablet (40 mg total) by mouth daily.   LEVOTHYROXINE (SYNTHROID, LEVOTHROID) 50 MCG TABLET    Take 1 tablet (50 mcg total) by mouth daily before breakfast.   OMEPRAZOLE (PRILOSEC) 40 MG CAPSULE    Take 1 capsule (40 mg total) by mouth daily.   TRAZODONE (DESYREL) 50 MG TABLET    TAKE ONE TABLET AT BEDTIME   VENLAFAXINE (EFFEXOR) 75 MG TABLET    Take 1 tablet (75 mg total) by mouth 2 (two) times daily.  Modified Medications   No medications on file  Discontinued Medications   FLAVOXATE (URISPAS) 100 MG TABLET    Take 100 mg by mouth 3 (three) times daily as needed for bladder spasms.   KETOROLAC (TORADOL) 10 MG TABLET    Take 10 mg by mouth every 6 (six) hours as needed.

## 2014-01-03 NOTE — Progress Notes (Signed)
Pre visit review using our clinic review tool, if applicable. No additional management support is needed unless otherwise documented below in the visit note. 

## 2014-01-04 LAB — HEPATIC FUNCTION PANEL
ALBUMIN: 3.9 g/dL (ref 3.5–5.2)
ALT: 14 U/L (ref 0–35)
AST: 21 U/L (ref 0–37)
Alkaline Phosphatase: 54 U/L (ref 39–117)
Bilirubin, Direct: 0.1 mg/dL (ref 0.0–0.3)
TOTAL PROTEIN: 7.5 g/dL (ref 6.0–8.3)
Total Bilirubin: 0.8 mg/dL (ref 0.2–1.2)

## 2014-01-04 LAB — CBC WITH DIFFERENTIAL/PLATELET
BASOS PCT: 0.4 % (ref 0.0–3.0)
Basophils Absolute: 0 10*3/uL (ref 0.0–0.1)
EOS PCT: 0.1 % (ref 0.0–5.0)
Eosinophils Absolute: 0 10*3/uL (ref 0.0–0.7)
HEMATOCRIT: 43.3 % (ref 36.0–46.0)
HEMOGLOBIN: 14.3 g/dL (ref 12.0–15.0)
Lymphocytes Relative: 12.5 % (ref 12.0–46.0)
Lymphs Abs: 1.7 10*3/uL (ref 0.7–4.0)
MCHC: 32.9 g/dL (ref 30.0–36.0)
MCV: 90.3 fl (ref 78.0–100.0)
MONO ABS: 0.9 10*3/uL (ref 0.1–1.0)
MONOS PCT: 6.3 % (ref 3.0–12.0)
NEUTROS ABS: 11.1 10*3/uL — AB (ref 1.4–7.7)
Neutrophils Relative %: 80.7 % — ABNORMAL HIGH (ref 43.0–77.0)
PLATELETS: 261 10*3/uL (ref 150.0–400.0)
RBC: 4.8 Mil/uL (ref 3.87–5.11)
RDW: 12.6 % (ref 11.5–15.5)
WBC: 13.7 10*3/uL — AB (ref 4.0–10.5)

## 2014-01-04 LAB — LIPASE: LIPASE: 24 U/L (ref 11.0–59.0)

## 2014-01-05 LAB — URINE CULTURE: Colony Count: 6000

## 2014-01-10 ENCOUNTER — Telehealth: Payer: Self-pay

## 2014-01-10 ENCOUNTER — Other Ambulatory Visit (INDEPENDENT_AMBULATORY_CARE_PROVIDER_SITE_OTHER): Payer: BC Managed Care – PPO

## 2014-01-10 DIAGNOSIS — R5383 Other fatigue: Secondary | ICD-10-CM

## 2014-01-10 NOTE — Telephone Encounter (Signed)
Pt request thyroid lab but 12/13/13 result note Dr Deborra Medina decreased thyroid med and wanted pt to have labs in 4 weeks. Pt will come today for lab test 01/10/14 and request cb on 01/11/14 for lab result and thyroid med refill. Pt only has one more thyroid med left.

## 2014-01-10 NOTE — Telephone Encounter (Signed)
Noted  

## 2014-01-11 ENCOUNTER — Encounter: Payer: Self-pay | Admitting: *Deleted

## 2014-01-11 ENCOUNTER — Other Ambulatory Visit: Payer: BC Managed Care – PPO

## 2014-01-11 LAB — TSH: TSH: 1.41 u[IU]/mL (ref 0.35–4.50)

## 2014-01-11 LAB — T4, FREE: Free T4: 1.14 ng/dL (ref 0.60–1.60)

## 2014-02-13 ENCOUNTER — Other Ambulatory Visit: Payer: Self-pay | Admitting: Family Medicine

## 2014-02-19 ENCOUNTER — Ambulatory Visit (INDEPENDENT_AMBULATORY_CARE_PROVIDER_SITE_OTHER): Payer: BC Managed Care – PPO | Admitting: Family Medicine

## 2014-02-19 ENCOUNTER — Encounter: Payer: Self-pay | Admitting: Family Medicine

## 2014-02-19 VITALS — BP 120/70 | HR 94 | Temp 97.9°F | Wt 114.5 lb

## 2014-02-19 DIAGNOSIS — F329 Major depressive disorder, single episode, unspecified: Secondary | ICD-10-CM

## 2014-02-19 DIAGNOSIS — R34 Anuria and oliguria: Secondary | ICD-10-CM | POA: Insufficient documentation

## 2014-02-19 DIAGNOSIS — R300 Dysuria: Secondary | ICD-10-CM

## 2014-02-19 DIAGNOSIS — F32A Depression, unspecified: Secondary | ICD-10-CM

## 2014-02-19 LAB — POCT URINALYSIS DIPSTICK
Bilirubin, UA: NEGATIVE
Glucose, UA: NEGATIVE
KETONES UA: NEGATIVE
Leukocytes, UA: NEGATIVE
Nitrite, UA: NEGATIVE
PH UA: 5.5
Protein, UA: POSITIVE
RBC UA: NEGATIVE
UROBILINOGEN UA: 2

## 2014-02-19 NOTE — Patient Instructions (Signed)
Good to see you. Hang in there.  Please drink more water. If symptoms do not improve, please come back to see me.  Consider hospice for your mother and update me.

## 2014-02-19 NOTE — Assessment & Plan Note (Signed)
Deteriorated- situational. >25 minutes spent in face to face time with patient, >50% spent in counselling or coordination of care Since I also care for her mother, we discussed hospice care as an alternative for her.  She understandably had many questions which we addressed. Declined rx change which is appropriate since situational.

## 2014-02-19 NOTE — Assessment & Plan Note (Signed)
New- UA neg except for protein. Likely due to decreased water intake with increased stressors at home. Push fluids. Call or return to clinic prn if these symptoms worsen or fail to improve as anticipated. The patient indicates understanding of these issues and agrees with the plan.

## 2014-02-19 NOTE — Progress Notes (Signed)
Subjective:   Patient ID: Tanya Nguyen, female    DOB: 22-May-1965, 48 y.o.   MRN: [id number]  Tanya Nguyen is a pleasant 48 y.o. year old female who presents to clinic today with decreased urinary frequency  on 02/19/2014  HPI:  ?UTI- decreased urinary frequency over past couple of days. No dysuria, hematuria, nausea, vomiting or back pain.  No fevers.  Has been under more stress lately- mom's health is really deteriorating. She can no longer care for her on her own- home health is helping with the burden a little bit but her condition is getting worse. She looked into SNF/AL- would cost 40,000- 70,000 per year.  Pam has been understandably more anxious and tearful.  She is taking Celexa 40 mg daily as directed. Denies any SI or HI.  Current Outpatient Prescriptions on File Prior to Visit  Medication Sig Dispense Refill  . cetirizine (ZYRTEC) 10 MG tablet Take 10 mg by mouth daily.    . citalopram (CELEXA) 40 MG tablet Take 1 tablet (40 mg total) by mouth daily. 30 tablet 3  . HYDROcodone-acetaminophen (NORCO/VICODIN) 5-325 MG per tablet Take 1 tablet by mouth every 6 (six) hours as needed for moderate pain. 30 tablet 0  . levothyroxine (SYNTHROID, LEVOTHROID) 50 MCG tablet TAKE ONE TABLET EVERY MORNING BEFORE BREAKFAST 30 tablet 4  . omeprazole (PRILOSEC) 40 MG capsule Take 1 capsule (40 mg total) by mouth daily. 30 capsule 5  . tamsulosin (FLOMAX) 0.4 MG CAPS capsule Take 1 capsule (0.4 mg total) by mouth daily. 30 capsule 3  . traZODone (DESYREL) 50 MG tablet TAKE ONE (1) TABLET AT BEDTIME 30 tablet 1  . venlafaxine (EFFEXOR) 75 MG tablet Take 1 tablet (75 mg total) by mouth 2 (two) times daily. 60 tablet 3   No current facility-administered medications on file prior to visit.    Allergies  Allergen Reactions  . Nsaids     Other reaction(s): OTHER  . Sulfa Antibiotics Nausea And Vomiting    Past Medical History  Diagnosis Date  . GERD (gastroesophageal reflux disease)     . Migraine headache   . Hypothyroidism   . Depression   . Bleeding duodenal ulcer   . Kidney stones     Past Surgical History  Procedure Laterality Date  . Abdominal hysterectomy  2004    partial  . Cholecystectomy    . Tooth extraction      Family History  Problem Relation Age of Onset  . Cancer Mother   . Cancer Father   . Alcohol abuse Sister   . Alcohol abuse Maternal Grandmother   . Stroke Maternal Grandmother   . Stroke Paternal Grandmother     History   Social History  . Marital Status: Married    Spouse Name: N/A    Number of Children: N/A  . Years of Education: N/A   Occupational History  . Not on file.   Social History Main Topics  . Smoking status: Never Smoker   . Smokeless tobacco: Never Used  . Alcohol Use: Yes  . Drug Use: No  . Sexual Activity: Yes   Other Topics Concern  . Not on file   Social History Narrative   The PMH, PSH, Social History, Family History, Medications, and allergies have been reviewed in [facility], and have been updated if relevant.    Review of Systems  Constitutional: Negative.   Cardiovascular: Negative.   Gastrointestinal: Negative.   Genitourinary: Positive for decreased urine volume. Negative for  dysuria, urgency, frequency, hematuria, flank pain, vaginal bleeding, vaginal discharge, enuresis, difficulty urinating, genital sores, vaginal pain, pelvic pain and dyspareunia.  Neurological: Negative.   Psychiatric/Behavioral: Positive for sleep disturbance. Negative for suicidal ideas and self-injury. The patient is nervous/anxious.   All other systems reviewed and are negative.      Objective:    BP 120/70 mmHg  Pulse 94  Temp(Src) 97.9 F (36.6 C) (Oral)  Wt 114 lb 8 oz (51.937 kg)  SpO2 98%   Physical Exam  Constitutional: She is oriented to person, place, and time. She appears well-developed and well-nourished. No distress.  HENT:  Head: Normocephalic.  Cardiovascular: Normal rate.   Pulmonary/Chest:  Effort normal.  Neurological: She is alert and oriented to person, place, and time.  Skin: Skin is warm and dry.  Psychiatric:  Tearful but appropriate. Good eye contact  Nursing note and vitals reviewed.         Assessment & Plan:   Dysuria - Plan: Urinalysis Dipstick, Urine culture  Decreased urine volume  Depression No Follow-up on file.

## 2014-02-20 ENCOUNTER — Telehealth: Payer: Self-pay

## 2014-02-20 MED ORDER — ALPRAZOLAM 0.25 MG PO TABS: 0.2500 mg | ORAL_TABLET | Freq: Two times a day (BID) | ORAL | Status: DC | PRN

## 2014-02-20 NOTE — Telephone Encounter (Signed)
Rx called in to requested pharmacy 

## 2014-02-20 NOTE — Telephone Encounter (Signed)
Pt requesting med for nerves; pt shaking and crying and not sleeping. Pt said was seen 02/19/14 and was going to try to cope with situation but pt is not able to cope; No SI/HI. Please advise. Pt said hospice is supposed to come today. Pt request cb.[name].

## 2014-02-20 NOTE — Telephone Encounter (Signed)
I know this is hard. She is already taking celexa (?and effexor- please verify with her as I typically try not to prescribe both)-  Will send in short course of low dose xanax prn anxiety/panic attacks/insomnia. This will make you sleepy and can be habit forming.  Please call in rx as entered.

## 2014-02-21 LAB — URINE CULTURE
COLONY COUNT: NO GROWTH
Organism ID, Bacteria: NO GROWTH

## 2014-03-22 ENCOUNTER — Other Ambulatory Visit: Payer: Self-pay | Admitting: Family Medicine

## 2014-04-02 ENCOUNTER — Ambulatory Visit (INDEPENDENT_AMBULATORY_CARE_PROVIDER_SITE_OTHER): Payer: 59 | Admitting: Family Medicine

## 2014-04-02 ENCOUNTER — Encounter: Payer: Self-pay | Admitting: Family Medicine

## 2014-04-02 VITALS — BP 128/70 | HR 82 | Temp 97.5°F | Wt 117.2 lb

## 2014-04-02 DIAGNOSIS — K5900 Constipation, unspecified: Secondary | ICD-10-CM | POA: Insufficient documentation

## 2014-04-02 DIAGNOSIS — E038 Other specified hypothyroidism: Secondary | ICD-10-CM

## 2014-04-02 DIAGNOSIS — R5383 Other fatigue: Secondary | ICD-10-CM

## 2014-04-02 HISTORY — DX: Constipation, unspecified: K59.00

## 2014-04-02 LAB — TSH: TSH: 22.69 u[IU]/mL — ABNORMAL HIGH (ref 0.35–4.50)

## 2014-04-02 LAB — T3, FREE: T3, Free: 3.2 pg/mL (ref 2.3–4.2)

## 2014-04-02 LAB — T4, FREE: Free T4: 0.7 ng/dL (ref 0.60–1.60)

## 2014-04-02 LAB — VITAMIN B12: Vitamin B-12: 1310 pg/mL — ABNORMAL HIGH (ref 211–911)

## 2014-04-02 MED ORDER — ALPRAZOLAM 0.25 MG PO TABS: 0.2500 mg | ORAL_TABLET | Freq: Two times a day (BID) | ORAL | Status: DC | PRN

## 2014-04-02 NOTE — Progress Notes (Signed)
Subjective:    Patient ID: Tanya Nguyen, female    DOB: 08-08-1965, 49 y.o.   MRN: [id number]  HPI  Here for progressive fatigue.  Ongoing for months. Sleeps ok with trazodone. Does not feel more depressed unless she is at home for long periods of time because she worries about her mom.  Takes synthroid 50 mcg daily.  Lab Results  Component Value Date   TSH 1.41 01/10/2014   Has had more constipation.  Stools softeners not helping.  When she does have BM it is "a hard ball." Denies cold intolerance.  Very tired.  Denies feeling CP or SOB.  Does have a h/o B12 deficiency- she is taking daily OTC B12. Lab Results  Component Value Date   [id number] 854 12/13/2013    Lab Results  Component Value Date   WBC 13.7* 01/03/2014   HGB 14.3 01/03/2014   HCT 43.3 01/03/2014   MCV 90.3 01/03/2014   PLT 261.0 01/03/2014   Current Outpatient Prescriptions on File Prior to Visit  Medication Sig Dispense Refill  . ALPRAZolam (XANAX) 0.25 MG tablet Take 1 tablet (0.25 mg total) by mouth 2 (two) times daily as needed for anxiety. 20 tablet 0  . cetirizine (ZYRTEC) 10 MG tablet Take 10 mg by mouth daily.    . citalopram (CELEXA) 40 MG tablet Take 1 tablet (40 mg total) by mouth daily. 30 tablet 3  . HYDROcodone-acetaminophen (NORCO/VICODIN) 5-325 MG per tablet Take 1 tablet by mouth every 6 (six) hours as needed for moderate pain. 30 tablet 0  . levothyroxine (SYNTHROID, LEVOTHROID) 50 MCG tablet TAKE ONE TABLET EVERY MORNING BEFORE BREAKFAST 30 tablet 4  . omeprazole (PRILOSEC) 40 MG capsule Take 1 capsule (40 mg total) by mouth daily. 30 capsule 5  . traZODone (DESYREL) 50 MG tablet TAKE ONE TABLET AT BEDTIME 30 tablet 1  . venlafaxine (EFFEXOR) 75 MG tablet Take 1 tablet (75 mg total) by mouth 2 (two) times daily. 60 tablet 3  . tamsulosin (FLOMAX) 0.4 MG CAPS capsule Take 1 capsule (0.4 mg total) by mouth daily. (Patient not taking: Reported on 04/02/2014) 30 capsule 3   No current  facility-administered medications on file prior to visit.    Allergies  Allergen Reactions  . Nsaids     Other reaction(s): OTHER  . Sulfa Antibiotics Nausea And Vomiting    Past Medical History  Diagnosis Date  . GERD (gastroesophageal reflux disease)   . Migraine headache   . Hypothyroidism   . Depression   . Bleeding duodenal ulcer   . Kidney stones     Past Surgical History  Procedure Laterality Date  . Abdominal hysterectomy  2004    partial  . Cholecystectomy    . Tooth extraction      Family History  Problem Relation Age of Onset  . Cancer Mother   . Cancer Father   . Alcohol abuse Sister   . Alcohol abuse Maternal Grandmother   . Stroke Maternal Grandmother   . Stroke Paternal Grandmother     History   Social History  . Marital Status: Married    Spouse Name: N/A    Number of Children: N/A  . Years of Education: N/A   Occupational History  . Not on file.   Social History Main Topics  . Smoking status: Never Smoker   . Smokeless tobacco: Never Used  . Alcohol Use: 0.0 oz/week    0 Not specified per week  . Drug Use: No  .  Sexual Activity: Yes   Other Topics Concern  . Not on file   Social History Narrative   The PMH, PSH, Social History, Family History, Medications, and allergies have been reviewed in [facility], and have been updated if relevant.   Review of Systems  Constitutional: Positive for activity change and fatigue. Negative for chills, diaphoresis and appetite change.  HENT: Negative.   Eyes: Negative.   Respiratory: Negative.   Cardiovascular: Negative.   Endocrine: Negative.   Genitourinary: Negative.   Psychiatric/Behavioral: The patient is nervous/anxious.   All other systems reviewed and are negative.      Objective:   Physical Exam  Constitutional: She is oriented to person, place, and time. She appears well-developed and well-nourished. No distress.  HENT:  Head: Normocephalic and atraumatic.  Eyes: Pupils are equal,  round, and reactive to light.  Neck: Normal range of motion. Neck supple. No thyromegaly present.  Cardiovascular: Normal rate, regular rhythm and normal heart sounds.   Pulmonary/Chest: Effort normal and breath sounds normal.  Abdominal: Soft. Bowel sounds are normal.  Neurological: She is alert and oriented to person, place, and time.  Skin: Skin is warm and dry.  Psychiatric: She has a normal mood and affect. Her behavior is normal. Judgment and thought content normal.  Nursing note and vitals reviewed.  BP 128/70 mmHg  Pulse 105  Temp(Src) 97.5 F (36.4 C) (Oral)  Wt 117 lb 4 oz (53.184 kg)  Wt Readings from Last 3 Encounters:  04/02/14 117 lb 4 oz (53.184 kg)  02/19/14 114 lb 8 oz (51.937 kg)  01/03/14 115 lb 8 oz (52.39 kg)         Assessment & Plan:

## 2014-04-02 NOTE — Assessment & Plan Note (Signed)
Deteriorated. Likely related to her thyroid. Will advise miralax 17 gram daily-  Call or return to clinic prn if these symptoms worsen or fail to improve as anticipated. The patient indicates understanding of these issues and agrees with the plan.

## 2014-04-02 NOTE — Progress Notes (Signed)
Pre visit review using our clinic review tool, if applicable. No additional management support is needed unless otherwise documented below in the visit note. 

## 2014-04-02 NOTE — Patient Instructions (Signed)
Please start Miralax over the counter- 17 grams daily. We are checking blood work and will call you.

## 2014-04-02 NOTE — Assessment & Plan Note (Signed)
Deteriorated- ? Thyroid over or under replacement. Clinically euthyroid. Will check labs today.  Orders Placed This Encounter  Procedures  . TSH  . T4, Free  . T3, Free  . Vitamin B12  . CBC with Differential/Platelet

## 2014-04-03 ENCOUNTER — Other Ambulatory Visit: Payer: Self-pay | Admitting: Family Medicine

## 2014-04-03 MED ORDER — LEVOTHYROXINE SODIUM 75 MCG PO TABS: 75.0000 ug | ORAL_TABLET | Freq: Every day | ORAL | Status: DC

## 2014-04-05 ENCOUNTER — Other Ambulatory Visit: Payer: Self-pay | Admitting: Family Medicine

## 2014-04-08 ENCOUNTER — Emergency Department: Payer: Self-pay | Admitting: Emergency Medicine

## 2014-04-08 ENCOUNTER — Telehealth: Payer: Self-pay | Admitting: Family Medicine

## 2014-04-08 LAB — URINALYSIS, COMPLETE
BACTERIA: NONE SEEN
Bilirubin,UR: NEGATIVE
Blood: NEGATIVE
Glucose,UR: NEGATIVE mg/dL (ref 0–75)
Ketone: NEGATIVE
Leukocyte Esterase: NEGATIVE
Nitrite: NEGATIVE
Ph: 5 (ref 4.5–8.0)
Protein: NEGATIVE
RBC,UR: NONE SEEN /HPF (ref 0–5)
Specific Gravity: 1.013 (ref 1.003–1.030)
WBC UR: <1 /HPF (ref 0–5)

## 2014-04-08 LAB — COMPREHENSIVE METABOLIC PANEL
ANION GAP: 4 — AB (ref 7–16)
AST: 28 U/L (ref 15–37)
Albumin: 4 g/dL (ref 3.4–5.0)
Alkaline Phosphatase: 69 U/L (ref 46–116)
BUN: 19 mg/dL — AB (ref 7–18)
Bilirubin,Total: 0.2 mg/dL (ref 0.2–1.0)
CALCIUM: 9.2 mg/dL (ref 8.5–10.1)
CHLORIDE: 103 mmol/L (ref 98–107)
Co2: 33 mmol/L — ABNORMAL HIGH (ref 21–32)
Creatinine: 0.98 mg/dL (ref 0.60–1.30)
EGFR (African American): >60
EGFR (Non-African Amer.): >60
GLUCOSE: 92 mg/dL (ref 65–99)
Osmolality: 281 (ref 275–301)
Potassium: 3.8 mmol/L (ref 3.5–5.1)
SGPT (ALT): 16 U/L (ref 14–63)
Sodium: 140 mmol/L (ref 136–145)
Total Protein: 7.1 g/dL (ref 6.4–8.2)

## 2014-04-08 LAB — CBC WITH DIFFERENTIAL/PLATELET
Basophil #: 0 10*3/uL (ref 0.0–0.1)
Basophil %: 0.5 %
EOS ABS: 0.1 10*3/uL (ref 0.0–0.7)
EOS PCT: 1.6 %
HCT: 40.9 % (ref 35.0–47.0)
HGB: 13.6 g/dL (ref 12.0–16.0)
LYMPHS PCT: 25.3 %
Lymphocyte #: 2 10*3/uL (ref 1.0–3.6)
MCH: 30.1 pg (ref 26.0–34.0)
MCHC: 33.2 g/dL (ref 32.0–36.0)
MCV: 91 fL (ref 80–100)
Monocyte #: 0.4 x10 3/mm (ref 0.2–0.9)
Monocyte %: 5.2 %
NEUTROS PCT: 67.4 %
Neutrophil #: 5.3 10*3/uL (ref 1.4–6.5)
PLATELETS: 233 10*3/uL (ref 150–440)
RBC: 4.51 10*6/uL (ref 3.80–5.20)
RDW: 13.6 % (ref 11.5–14.5)
WBC: 7.9 10*3/uL (ref 3.6–11.0)

## 2014-04-08 LAB — LIPASE, BLOOD: Lipase: 167 U/L (ref 73–393)

## 2014-04-08 NOTE — Telephone Encounter (Signed)
Patient Name: Tanya Nguyen  DOB: May 17, 1965    Initial Comment [name] states having trouble having bowel movements, stool very hard, taking stool softeners. Stomach swollem and hard   Nurse Assessment  Nurse: Wynetta Emery, RN, Baker Janus Date/Time Eilene Ghazi Time): 04/08/2014 4:44:01 PM  Confirm and document reason for call. If symptomatic, describe symptoms. ---Carely has been having trouble having bowel movements (trouble all her life) on Citracil ( hard on her stomach burns it) stool softners taken enema given yesterday no results but had rectal bleeding Abd swollen not able to eat  Has the patient traveled out of the country within the last 30 days? ---No  Does the patient require triage? ---Yes  Related visit to physician within the last 2 weeks? ---No  Does the PT have any chronic conditions? (i.e. diabetes, asthma, etc.) ---Yes  List chronic conditions. ---Chronic constipation thyroid disease  Did the patient indicate they were pregnant? ---No     Guidelines    Guideline Title Affirmed Question Affirmed Notes  Constipation [1] Constant abdominal pain AND [2] present > 2 hours    Final Disposition User   See Physician within 4 Hours (or PCP triage) Wynetta Emery, RN, Baker Janus

## 2014-04-09 ENCOUNTER — Encounter: Payer: Self-pay | Admitting: Family Medicine

## 2014-04-09 ENCOUNTER — Telehealth: Payer: Self-pay

## 2014-04-09 DIAGNOSIS — K5900 Constipation, unspecified: Secondary | ICD-10-CM

## 2014-04-09 NOTE — Telephone Encounter (Signed)
PLEASE NOTE: All timestamps contained within this report are represented as [place] Standard Time. CONFIDENTIALTY NOTICE: This fax transmission is intended only for the addressee. It contains information that is legally privileged, confidential or otherwise protected from use or disclosure. If you are not the intended recipient, you are strictly prohibited from reviewing, disclosing, copying using or disseminating any of this information or taking any action in reliance on or regarding this information. If you have received this fax in error, please notify us immediately by telephone so that we can arrange for its return to [place]. Phone: [phone], Toll-Free: [phone], Fax: [phone] Page: 1 of 2 Call Id: [id number] [place] Patient Name: Tanya Nguyen Gender: Female DOB: 09/08/1965 Age: 49 Y 17 M 12 D Return Phone Number: [phone] (Primary) Address: City/State/Zip: [place] [facility] [place] [facility] - Client Client Site [facility] - Day Physician Arnette Norris Contact Type Call Call Type Triage / Clinical Relationship To Patient Self Appointment Disposition EMR Appointment Attempted - Not Scheduled Return Phone Number [phone] (Primary) Chief Complaint Constipation Initial Comment [name] states having trouble having bowel movements, stool very hard, taking stool softeners. Stomach swollem and hard PreDisposition Go to Urgent Care/Walk-In Clinic Info pasted into Epic Yes Nurse Assessment Nurse: Wynetta Emery, RN, Baker Janus Date/Time Eilene Ghazi Time): 04/08/2014 4:44:01 PM Confirm and document reason for call. If symptomatic, describe symptoms. ---Tanya Nguyen has been having trouble having bowel movements (trouble all her life) on Citracil ( hard on her stomach burns it) stool softners taken enema given yesterday no results but had rectal bleeding Abd swollen not  able to eat Has the patient traveled out of the country within the last 30 days? ---No Does the patient require triage? ---Yes Related visit to physician within the last 2 weeks? ---No Does the PT have any chronic conditions? (i.e. diabetes, asthma, etc.) ---Yes List chronic conditions. ---Chronic constipation thyroid disease Did the patient indicate they were pregnant? ---No Guidelines Guideline Title Affirmed Question Affirmed Notes Nurse Date/Time (Eastern Time) Constipation [1] Constant abdominal pain AND [2] present > 2 hours Tanya Nguyen 04/08/2014 4:47:36 PM Disp. Time Eilene Ghazi Time) Disposition Final User 04/08/2014 4:48:45 PM See Physician within 4 Hours (or PCP triage) Yes Wynetta Emery, RN, Baker Janus PLEASE NOTE: All timestamps contained within this report are represented as [place] Standard Time. CONFIDENTIALTY NOTICE: This fax transmission is intended only for the addressee. It contains information that is legally privileged, confidential or otherwise protected from use or disclosure. If you are not the intended recipient, you are strictly prohibited from reviewing, disclosing, copying using or disseminating any of this information or taking any action in reliance on or regarding this information. If you have received this fax in error, please notify us immediately by telephone so that we can arrange for its return to [place]. Phone: [phone], Toll-Free: [phone], Fax: [phone] Page: 2 of 2 Call Id: [id number] [name] Understands: Yes Disagree/Comply: Comply Care Advice Given Per Guideline SEE PHYSICIAN WITHIN 4 HOURS (or PCP triage): NOTHING BY MOUTH: Do not eat or drink anything for now. CALL BACK IF: * You become worse. CARE ADVICE given per Constipation (Adult) guideline. After Care Instructions Given Call Event Type User Date / Time Description Referrals [facility] - ED

## 2014-04-09 NOTE — Telephone Encounter (Signed)
I placed the referral but I cannot say how quickly she can see GI.  Has she tried miralax?

## 2014-04-09 NOTE — Telephone Encounter (Signed)
Pt left v/m requesting cb about constipation. Left /vm requesting pt to cb.

## 2014-04-09 NOTE — Telephone Encounter (Signed)
Spoke to pt who states that she was advised to take Metamucil, a stool softener, and a "bottle of stuff to drink." She states that she is wanting referral to GI and is requesting that she been seen ASAP. Pt reports that she goes 2-3 months without having a BM. Pts last BM was 92mo ago, other than "balls" she has released today. She reports that she can "hardly make it" feeling the way she is now. Pt states that she has used a suppository and an enema and enema only yielded blood.

## 2014-04-09 NOTE — Telephone Encounter (Signed)
Notes reviewed.  What did they advise she take for constipation?  We can certainly refer her to GI as well along with rxs that will help with her constipation.

## 2014-04-09 NOTE — Telephone Encounter (Signed)
Pt left v/m requesting cb; spoke with pt;pt was seen at [facility] ED on 04/08/14; pt said on xray both sides of ribs and abdomen is layered with balls of stool. pts abdomen is distended and hurting and pt said there are times when pt goes for weeks without BM. Pt is not passing gas. Pt said she was told her constipation has been going on for years and some of the stool in her stomach has been there for a very long time. Pt wants to know if needs referral to stomach specialist or should pt f/u with Dr Deborra Medina. [facility] ED records requested by fax to go to Fax # [phone]. Pt said she is not sleeping due to stomach hurting and pt wants to know if trazodone could be increased. Pt last colonoscopy was 12-14 years ago. Tanya Nguyen. Pt request cb. Pt said if condition changes or worsens prior to cb pt will call [facility] or go to ED.

## 2014-04-10 NOTE — Telephone Encounter (Signed)
That's good.  Thanks for the update.

## 2014-04-10 NOTE — Telephone Encounter (Signed)
Spoke to pt and informed her of referral placement. She states that she is did take some miralax on yesterday and did have a BM after i spoke with her.

## 2014-04-11 ENCOUNTER — Other Ambulatory Visit: Payer: Self-pay | Admitting: Family Medicine

## 2014-04-15 ENCOUNTER — Ambulatory Visit: Payer: Self-pay | Admitting: Gastroenterology

## 2014-04-15 LAB — HM COLONOSCOPY: HM Colonoscopy: NORMAL

## 2014-04-24 ENCOUNTER — Ambulatory Visit (INDEPENDENT_AMBULATORY_CARE_PROVIDER_SITE_OTHER): Payer: 59 | Admitting: Internal Medicine

## 2014-04-24 ENCOUNTER — Encounter: Payer: Self-pay | Admitting: Internal Medicine

## 2014-04-24 ENCOUNTER — Other Ambulatory Visit: Payer: Self-pay | Admitting: Internal Medicine

## 2014-04-24 VITALS — BP 112/76 | HR 87 | Temp 98.1°F | Wt 115.0 lb

## 2014-04-24 DIAGNOSIS — Z0000 Encounter for general adult medical examination without abnormal findings: Secondary | ICD-10-CM

## 2014-04-24 DIAGNOSIS — F32A Depression, unspecified: Secondary | ICD-10-CM

## 2014-04-24 DIAGNOSIS — R519 Headache, unspecified: Secondary | ICD-10-CM

## 2014-04-24 DIAGNOSIS — F329 Major depressive disorder, single episode, unspecified: Secondary | ICD-10-CM

## 2014-04-24 DIAGNOSIS — R251 Tremor, unspecified: Secondary | ICD-10-CM

## 2014-04-24 DIAGNOSIS — F411 Generalized anxiety disorder: Secondary | ICD-10-CM

## 2014-04-24 DIAGNOSIS — R51 Headache: Secondary | ICD-10-CM

## 2014-04-24 MED ORDER — TRAMADOL HCL 50 MG PO TABS: 50.0000 mg | ORAL_TABLET | Freq: Three times a day (TID) | ORAL | Status: DC | PRN

## 2014-04-24 NOTE — Telephone Encounter (Signed)
Pt has appt with Webb Silversmith NP today at [facility].

## 2014-04-24 NOTE — Patient Instructions (Signed)
Delirium Tremens Delirium tremens is the worst form of alcohol withdrawal. It usually happens 2 to 4 days after the last drink, but it may occur up to 7 to 10 days after the last drink. It involves sudden and severe changes in your mental and nervous system. Many people keep drinking to get rid of the discomfort felt during delirium tremens.Symptoms may last up to 7 days.Delirium tremens is a serious condition and can be life-threatening. Most people need treatment in a hospital or a detox unit for intravenous (IV) fluids, medicines to help with symptoms, and close monitoring by a trained staff.  CAUSES  Delirium tremens can occur when you suddenly quit or greatly reduce the amount of alcohol you normally drink.  SYMPTOMS   Fast heart rate.  Elevated temperature.  High blood pressure.  Seizures.  Tremors.  Sweating.  Fast breathing.  Anxiety.  Lack of coordination.  Trouble thinking clearly.  Nausea.  Seeing, hearing, or feeling things that are not really there (hallucinations). DIAGNOSIS   The diagnosis is usually made from the history of alcohol consumption and the presence of symptoms.  Medical tests may be done to rule out other conditions that can act like alcohol withdrawal. TREATMENT   Medicines are usually prescribed to prevent withdrawal symptoms. Some people need a calming drug (sedative) for a week or more until they are done with their withdrawal.  Going to the hospital is necessary if you are seriously ill. You may be given:  Thiamine to prevent a serious brain disorder.  Multivitamins and folate.  IV fluids if you are dehydrated.  Minerals.  Sedatives if you are anxious, agitated, or cannot sleep. HOME CARE INSTRUCTIONS  After treatment, you must:  Follow all instructions from your caregiver very carefully.  Take all medicines as prescribed. If you cannot, call your caregiver right away.  Keep all appointments for further evaluation and  counseling.  Not use drugs, alcohol, or any other mind-altering or mood-altering drugs.  Not operate a motor vehicle or hazardous machinery until your caregiver says it is okay. SEEK IMMEDIATE MEDICAL CARE IF:   You have a seizure (convulsions).  You become very shaky or agitated.  You have severe nausea and vomiting.  You throw up blood. This may be bright red or look like black coffee grounds.  You have blood in your stool. This may be bright red, bad smelling, or appear black and tarry.  You become lightheaded or faint. If you have any of the above symptoms, do not drive. Have someone else drive you, or call your local emergency services (911 in [place] FOR MORE INFORMATION  Treatment centers are listed in telephone book under: Alcoholism and Addiction Treatment, Substance Abuse Treatment or Cocaine, Narcotics, and Alcoholics Anonymous. Most hospitals and clinics can refer you to a specialized care center.  The [place] government maintains a toll-free number for getting treatment referrals: (540)536-3423 or 813-671-2868 (TDD). They also maintain a website: http://findtreatment.SamedayNews.com.cy. Other websites for more information are: www.mentalhealth.SamedayNews.com.cy and VoiceTranslations.de.  In [place], treatment centers are listed in the telephone book under each [place]. Listings are available under: [employer] [employer] or similar titles. Document Released: 02/22/2005 Document Revised: 05/17/2011 Document Reviewed: 05/20/2010 [facility] Patient Information 2015 [place], [place]. This information is not intended to replace advice given to you by your health care provider. Make sure you discuss any questions you have with your health care provider.

## 2014-04-24 NOTE — Progress Notes (Signed)
Pre visit review using our clinic review tool, if applicable. No additional management support is needed unless otherwise documented below in the visit note. 

## 2014-04-24 NOTE — Telephone Encounter (Signed)
[facility] Patient Name: Tanya Nguyen DOB: 1966-01-17 Initial Comment [name] states c/o withdrawals from [facility] powders - is shaky and has a headache Nurse Assessment Nurse: Donalynn Furlong, RN, Myna Hidalgo Date/Time Eilene Ghazi Time): 04/24/2014 10:06:27 AM Confirm and document reason for call. If symptomatic, describe symptoms. ---[name] states c/o withdrawals from [facility] powders - is shaky and has a headache. "Feels like a migraine", have taken BC for many, many years Has the patient traveled out of the country within the last 30 days? ---No Does the patient require triage? ---Yes Related visit to physician within the last 2 weeks? ---No Does the PT have any chronic conditions? (i.e. diabetes, asthma, etc.) ---No Did the patient indicate they were pregnant? ---No Guidelines Guideline Title Affirmed Question Affirmed Notes Headache [1] SEVERE headache (e.g., excruciating) AND [2] "worst headache" of life Final Disposition User Go to ED Now (or PCP triage) Donalynn Furlong, RN, Myna Hidalgo Comments Although s/s described recommended ED outcome, pt revealed she had an established apt w Dr Jackie Plum today at 4 pm. Recommended she have someone (husband) drive her to the office. Also recommended no NSAIDS, as well as [employer], as pt states she found out she also has 3 ulcers. Recommended Tylenol only today for HA, until she sees her PCP for evaluation at 4 pm
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
## 2014-04-29 ENCOUNTER — Encounter: Payer: Self-pay | Admitting: Family Medicine

## 2014-05-01 ENCOUNTER — Ambulatory Visit: Payer: 59 | Admitting: Family Medicine

## 2014-05-01 ENCOUNTER — Other Ambulatory Visit: Payer: 59

## 2014-05-01 ENCOUNTER — Encounter: Payer: Self-pay | Admitting: Family Medicine

## 2014-05-09 ENCOUNTER — Other Ambulatory Visit (HOSPITAL_COMMUNITY)
Admission: RE | Admit: 2014-05-09 | Discharge: 2014-05-09 | Disposition: A | Discharge status: Home / Self Care | Payer: 59 | Source: Ambulatory Visit | Attending: Family Medicine | Admitting: Family Medicine

## 2014-05-09 ENCOUNTER — Ambulatory Visit (INDEPENDENT_AMBULATORY_CARE_PROVIDER_SITE_OTHER): Payer: 59 | Admitting: Family Medicine

## 2014-05-09 ENCOUNTER — Encounter: Payer: Self-pay | Admitting: Family Medicine

## 2014-05-09 VITALS — BP 136/80 | HR 84 | Temp 97.4°F | Ht 59.75 in | Wt 114.2 lb

## 2014-05-09 DIAGNOSIS — Z9114 Patient's other noncompliance with medication regimen: Secondary | ICD-10-CM

## 2014-05-09 DIAGNOSIS — Z91148 Patient's other noncompliance with medication regimen for other reason: Secondary | ICD-10-CM | POA: Insufficient documentation

## 2014-05-09 DIAGNOSIS — Z01419 Encounter for gynecological examination (general) (routine) without abnormal findings: Secondary | ICD-10-CM | POA: Insufficient documentation

## 2014-05-09 DIAGNOSIS — Z0000 Encounter for general adult medical examination without abnormal findings: Secondary | ICD-10-CM

## 2014-05-09 DIAGNOSIS — Z1151 Encounter for screening for human papillomavirus (HPV): Secondary | ICD-10-CM | POA: Diagnosis present

## 2014-05-09 DIAGNOSIS — K219 Gastro-esophageal reflux disease without esophagitis: Secondary | ICD-10-CM

## 2014-05-09 DIAGNOSIS — F329 Major depressive disorder, single episode, unspecified: Secondary | ICD-10-CM

## 2014-05-09 DIAGNOSIS — E038 Other specified hypothyroidism: Secondary | ICD-10-CM

## 2014-05-09 DIAGNOSIS — F32A Depression, unspecified: Secondary | ICD-10-CM

## 2014-05-09 LAB — LIPID PANEL
Cholesterol: 221 mg/dL — ABNORMAL HIGH (ref 0–200)
HDL: 72.6 mg/dL (ref >39.00)
LDL Cholesterol: 138 mg/dL — ABNORMAL HIGH (ref 0–99)
NonHDL: 148.4
Total CHOL/HDL Ratio: 3
Triglycerides: 53 mg/dL (ref 0.0–149.0)
VLDL: 10.6 mg/dL (ref 0.0–40.0)

## 2014-05-09 LAB — CBC WITH DIFFERENTIAL/PLATELET
Basophils Absolute: 0 10*3/uL (ref 0.0–0.1)
Basophils Relative: 0.7 % (ref 0.0–3.0)
EOS PCT: 2.1 % (ref 0.0–5.0)
Eosinophils Absolute: 0.1 10*3/uL (ref 0.0–0.7)
HCT: 41.2 % (ref 36.0–46.0)
Hemoglobin: 14 g/dL (ref 12.0–15.0)
LYMPHS PCT: 35.6 % (ref 12.0–46.0)
Lymphs Abs: 1.7 10*3/uL (ref 0.7–4.0)
MCHC: 33.9 g/dL (ref 30.0–36.0)
MCV: 87.9 fl (ref 78.0–100.0)
Monocytes Absolute: 0.4 10*3/uL (ref 0.1–1.0)
Monocytes Relative: 7.8 % (ref 3.0–12.0)
NEUTROS ABS: 2.6 10*3/uL (ref 1.4–7.7)
NEUTROS PCT: 53.8 % (ref 43.0–77.0)
Platelets: 245 10*3/uL (ref 150.0–400.0)
RBC: 4.68 Mil/uL (ref 3.87–5.11)
RDW: 13.3 % (ref 11.5–15.5)
WBC: 4.9 10*3/uL (ref 4.0–10.5)

## 2014-05-09 LAB — COMPREHENSIVE METABOLIC PANEL
ALT: 13 U/L (ref 0–35)
AST: 21 U/L (ref 0–37)
Albumin: 4.4 g/dL (ref 3.5–5.2)
Alkaline Phosphatase: 64 U/L (ref 39–117)
BUN: 18 mg/dL (ref 6–23)
CO2: 35 meq/L — AB (ref 19–32)
CREATININE: 0.87 mg/dL (ref 0.40–1.20)
Calcium: 9.4 mg/dL (ref 8.4–10.5)
Chloride: 100 mEq/L (ref 96–112)
GFR: 73.37 mL/min (ref >60.00)
GLUCOSE: 81 mg/dL (ref 70–99)
Potassium: 3.8 mEq/L (ref 3.5–5.1)
SODIUM: 137 meq/L (ref 135–145)
Total Bilirubin: 0.4 mg/dL (ref 0.2–1.2)
Total Protein: 7.3 g/dL (ref 6.0–8.3)

## 2014-05-09 LAB — T4, FREE: Free T4: 0.95 ng/dL (ref 0.60–1.60)

## 2014-05-09 LAB — TSH: TSH: 11.39 u[IU]/mL — AB (ref 0.35–4.50)

## 2014-05-09 MED ORDER — TRAZODONE HCL 50 MG PO TABS: 50.0000 mg | ORAL_TABLET | Freq: Every day | ORAL | Status: DC

## 2014-05-09 NOTE — Assessment & Plan Note (Signed)
Overdue for labs today.  May still be under corrected. Orders entered.

## 2014-05-09 NOTE — Assessment & Plan Note (Signed)
Has not restarted BC powders. Advised her to start taking Tylenol as needed for pain and Tramadol as needed for severe pain/headaches. The patient indicates understanding of these issues and agrees with the plan.

## 2014-05-09 NOTE — Progress Notes (Signed)
Pre visit review using our clinic review tool, if applicable. No additional management support is needed unless otherwise documented below in the visit note. 

## 2014-05-09 NOTE — Assessment & Plan Note (Signed)
Reviewed preventive care protocols, scheduled due services, and updated immunizations Discussed nutrition, exercise, diet, and healthy lifestyle.  Discussed USPSTF recommendations of cervical cancer screening.  She is aware that interval of 3 years is recommended but pt would prefer to have pap smear done today.  Strongly urged her to schedule mammogram follow up. The patient indicates understanding of these issues and agrees with the plan.

## 2014-05-09 NOTE — Assessment & Plan Note (Signed)
Controlled on current rx. Declined psychiatry referral.

## 2014-05-09 NOTE — Progress Notes (Signed)
Subjective:   Patient ID: Tanya Nguyen, female    DOB: [birth year], 49 y.o.   MRN: [id number]  Tanya Nguyen is a pleasant 49 y.o. year old female who presents to clinic today with Annual Exam and Drug Problem  on 05/09/2014  HPI:  G0.  No h/o abnormal pap smears.  She had a partial hysterectomy but still has a cervix. Last pap smear was done by me on 03/22/13.  Mammogram was 06/06/13- advised 6 month follow up but she cancelled her appointment- she felt she did not need it.  Mom had breast CA in her 13s.  Medication overuse- saw Webb Silversmith on 04/24/14 after she stopped taking BC powders and developed a headache.  Apparently she has been taking them every 3-4 hours whether she needs them or not but did not tell me this.  Had colonoscopy/endoscopy by Dr. Allen Norris on 04/15/14- reviewed today.  Colonoscopy unremarkable but had gastric ulcers on endoscopy and was advised to stop taking BC Powder immediately. She tells me today that several years ago she had an upper GIB due to [facility] use.  Depression- Her symptoms are improved on Effexor, Celexa, Xanax and Trazadone but she wants to make sure there is not something more than depression going on. She denies SI/HI.  She was concerned that she may have manic depression or another psychiatric illness and and asked Rollene Fare to screen her for this.  Regina referred her to psychiatry for evaluation but she did not want to go.  Hypothyroidism- TSH very high on 04/02/14 so we increased her synthroid to 75 mcg daily.  She was due for labs but did not come in when scheduled.  Lab Results  Component Value Date   TSH 22.69* 04/02/2014   Current Outpatient Prescriptions on File Prior to Visit  Medication Sig Dispense Refill  . ALPRAZolam (XANAX) 0.25 MG tablet Take 1 tablet (0.25 mg total) by mouth 2 (two) times daily as needed for anxiety. 20 tablet 0  . cetirizine (ZYRTEC) 10 MG tablet Take 10 mg by mouth daily.    . citalopram (CELEXA) 40 MG tablet Take 1 tablet  (40 mg total) by mouth daily. 30 tablet 3  . dexlansoprazole (DEXILANT) 60 MG capsule Take 60 mg by mouth daily.    Marland Kitchen levothyroxine (SYNTHROID, LEVOTHROID) 75 MCG tablet Take 1 tablet (75 mcg total) by mouth daily. 90 tablet 3  . omeprazole (PRILOSEC) 40 MG capsule Take 1 capsule (40 mg total) by mouth daily. 30 capsule 5  . tamsulosin (FLOMAX) 0.4 MG CAPS capsule Take 1 capsule (0.4 mg total) by mouth daily. 30 capsule 3  . traMADol (ULTRAM) 50 MG tablet Take 1 tablet (50 mg total) by mouth every 8 (eight) hours as needed. 30 tablet 0  . traZODone (DESYREL) 50 MG tablet TAKE ONE TABLET AT BEDTIME 30 tablet 1  . venlafaxine (EFFEXOR) 75 MG tablet Take 1 tablet (75 mg total) by mouth 2 (two) times daily. 60 tablet 3   No current facility-administered medications on file prior to visit.    Allergies  Allergen Reactions  . Nsaids     Other reaction(s): OTHER  . Sulfa Antibiotics Nausea And Vomiting    Past Medical History  Diagnosis Date  . GERD (gastroesophageal reflux disease)   . Migraine headache   . Hypothyroidism   . Depression   . Bleeding duodenal ulcer   . Kidney stones     Past Surgical History  Procedure Laterality Date  . Abdominal hysterectomy  2004  partial  . Cholecystectomy    . Tooth extraction      Family History  Problem Relation Age of Onset  . Cancer Mother   . Cancer Father   . Alcohol abuse Sister   . Alcohol abuse Maternal Grandmother   . Stroke Maternal Grandmother   . Stroke Paternal Grandmother     History   Social History  . Marital Status: Married    Spouse Name: N/A  . Number of Children: N/A  . Years of Education: N/A   Occupational History  . Not on file.   Social History Main Topics  . Smoking status: Never Smoker   . Smokeless tobacco: Never Used  . Alcohol Use: 0.0 oz/week    0 Standard drinks or equivalent per week  . Drug Use: No  . Sexual Activity: Yes   Other Topics Concern  . Not on file   Social History  Narrative   The PMH, PSH, Social History, Family History, Medications, and allergies have been reviewed in [facility], and have been updated if relevant.    Review of Systems  Constitutional: Positive for fatigue. Negative for fever and unexpected weight change.  HENT: Negative.   Eyes: Negative.   Respiratory: Negative.   Cardiovascular: Negative.   Gastrointestinal: Negative.   Endocrine: Negative.   Genitourinary: Negative.   Musculoskeletal: Positive for arthralgias.  Skin: Negative.   Allergic/Immunologic: Negative.   Neurological: Negative.   Hematological: Negative.   Psychiatric/Behavioral: Negative.        Objective:    BP 136/80 mmHg  Pulse 84  Temp(Src) 97.4 F (36.3 C) (Oral)  Ht 4' 11.75" (1.518 m)  Wt 114 lb 4 oz (51.823 kg)  BMI 22.49 kg/m2  SpO2 97%   Physical Exam   General:  Well-developed,well-nourished,in no acute distress; alert,appropriate and cooperative throughout examination Head:  normocephalic and atraumatic.   Eyes:  vision grossly intact, pupils equal, pupils round, and pupils reactive to light.   Ears:  R ear normal and L ear normal.   Nose:  no external deformity.   Mouth:  good dentition.   Neck:  No deformities, masses, or tenderness noted. Breasts:  No mass, nodules, thickening, tenderness, bulging, retraction, inflamation, nipple discharge or skin changes noted.   Lungs:  Normal respiratory effort, chest expands symmetrically. Lungs are clear to auscultation, no crackles or wheezes. Heart:  Normal rate and regular rhythm. S1 and S2 normal without gallop, murmur, click, rub or other extra sounds. Abdomen:  Bowel sounds positive,abdomen soft and non-tender without masses, organomegaly or hernias noted. Rectal:  no external abnormalities.   Genitalia:  Pelvic Exam:        External: normal female genitalia without lesions or masses        Vagina: normal without lesions or masses        Cervix: normal without lesions or masses          Uterus: absent        Pap smear: performed Msk:  No deformity or scoliosis noted of thoracic or lumbar spine.   Extremities:  No clubbing, cyanosis, edema, or deformity noted with normal full range of motion of all joints.   Neurologic:  alert & oriented X3 and gait normal.   Skin:  Intact without suspicious lesions or rashes Cervical Nodes:  No lymphadenopathy noted Axillary Nodes:  No palpable lymphadenopathy Psych:  Cognition and judgment appear intact. Alert and cooperative with normal attention span and concentration. No apparent delusions, illusions, hallucinations  Assessment & Plan:   Well woman exam  Overuse of medication  Gastroesophageal reflux disease without esophagitis  Depression  Other specified hypothyroidism No Follow-up on file.

## 2014-05-09 NOTE — Addendum Note (Signed)
Addended by: Modena Nunnery on: 05/09/2014 12:36 PM   Modules accepted: Orders

## 2014-05-09 NOTE — Patient Instructions (Signed)
Good to see you. Try adding Tylenol twice daily.  Please schedule your mammogram as soon as possible.

## 2014-05-10 ENCOUNTER — Other Ambulatory Visit: Payer: Self-pay | Admitting: Family Medicine

## 2014-05-10 LAB — CYTOLOGY - PAP

## 2014-05-10 MED ORDER — LEVOTHYROXINE SODIUM 100 MCG PO TABS: 100.0000 ug | ORAL_TABLET | Freq: Every day | ORAL | Status: DC

## 2014-05-10 NOTE — Telephone Encounter (Signed)
Rx called in to requested pharmacy 

## 2014-05-13 ENCOUNTER — Telehealth: Payer: Self-pay | Admitting: Family Medicine

## 2014-05-13 ENCOUNTER — Encounter: Payer: Self-pay | Admitting: *Deleted

## 2014-05-13 NOTE — Telephone Encounter (Signed)
Patient returned your call.

## 2014-05-13 NOTE — Telephone Encounter (Signed)
See lab results.  

## 2014-05-27 ENCOUNTER — Other Ambulatory Visit: Payer: Self-pay | Admitting: Family Medicine

## 2014-05-27 ENCOUNTER — Telehealth: Payer: Self-pay | Admitting: Family Medicine

## 2014-05-27 DIAGNOSIS — R928 Other abnormal and inconclusive findings on diagnostic imaging of breast: Secondary | ICD-10-CM | POA: Insufficient documentation

## 2014-05-27 NOTE — Telephone Encounter (Signed)
Order placed for bilateral diag mammogram since she is due for screening mammogram as well.

## 2014-05-27 NOTE — Telephone Encounter (Signed)
Pt stated it is time for her diagnostic  mammogram [facility] Needs order

## 2014-05-28 NOTE — Telephone Encounter (Signed)
Last f/u 05/2014-CPE 

## 2014-05-28 NOTE — Telephone Encounter (Signed)
Rx called in to requested pharmacy 

## 2014-05-29 ENCOUNTER — Telehealth: Payer: Self-pay | Admitting: Family Medicine

## 2014-05-29 NOTE — Telephone Encounter (Signed)
Yes agree with recommendation to go to ER.

## 2014-05-29 NOTE — Telephone Encounter (Signed)
Pt has appt with Dr Deborra Medina on 05/30/14 at 10:30 AM.

## 2014-05-29 NOTE — Telephone Encounter (Signed)
Spoke to patient, she has had the headache for 4 days. She did not want to have to wait in ER for a long period of time, She has a scheduled appointment with you tomorrow at 10:30am. Her headache has gotten a little better now, 5/10 now, she has taken Tylenol 500mg  (2pills every 4 hours)

## 2014-05-29 NOTE — Telephone Encounter (Signed)
Patient Name: Tanya Nguyen DOB: June 19, 1965 Initial Comment [name] states she is having headaches. Nurse Assessment Nurse: Vallery Sa, RN, Cathy Date/Time (Eastern Time): 05/29/2014 1:21:04 PM Confirm and document reason for call. If symptomatic, describe symptoms. ---[name] states she has had a headache for the past 5 days. No injury in the past 3 days. No fever. Has the patient traveled out of the country within the last 30 days? ---No Does the patient require triage? ---Yes Related visit to physician within the last 2 weeks? ---No Does the PT have any chronic conditions? (i.e. diabetes, asthma, etc.) ---Yes List chronic conditions. ---Thyroid problems, Ulcers Did the patient indicate they were pregnant? ---No Guidelines Guideline Title Affirmed Question Affirmed Notes Headache [1] SEVERE headache (e.g., excruciating) AND [2] "worst headache" of life Final Disposition User Go to ED Now (or PCP triage) Vallery Sa, RN, Cathy Comments [name] declined the Go to ER disposition. Reinforced the Go to ER disposition. She shares and I confirmed that she has a 10:30am appointment scheduled with Dr. Deborra Medina tomorrow. Encouraged [name] to call back as needed. Called the office backline and notified [name].

## 2014-05-29 NOTE — Telephone Encounter (Signed)
[facility] called she spoke with ms Bozza for headache Rated a 10 on 1-10 scale Advise pt to go to er. Pt declined Pt has appointment tomorrow  Juliann Pulse will sent notes

## 2014-05-30 ENCOUNTER — Ambulatory Visit (INDEPENDENT_AMBULATORY_CARE_PROVIDER_SITE_OTHER): Payer: 59 | Admitting: Family Medicine

## 2014-05-30 ENCOUNTER — Encounter: Payer: Self-pay | Admitting: Family Medicine

## 2014-05-30 VITALS — BP 126/88 | HR 96 | Temp 98.2°F | Wt 117.2 lb

## 2014-05-30 DIAGNOSIS — G43809 Other migraine, not intractable, without status migrainosus: Secondary | ICD-10-CM | POA: Diagnosis not present

## 2014-05-30 MED ORDER — SUMATRIPTAN SUCCINATE 25 MG PO TABS: 25.0000 mg | ORAL_TABLET | ORAL | Status: DC | PRN

## 2014-05-30 MED ORDER — KETOROLAC TROMETHAMINE 60 MG/2ML IM SOLN
60.0000 mg | Freq: Once | INTRAMUSCULAR | Status: AC
  Administered 2014-05-30: 60 mg via INTRAMUSCULAR

## 2014-05-30 MED ORDER — ONDANSETRON HCL 4 MG PO TABS: 4.0000 mg | ORAL_TABLET | Freq: Three times a day (TID) | ORAL | Status: DC | PRN

## 2014-05-30 NOTE — Progress Notes (Signed)
Subjective:   Patient ID: Tanya Nguyen, female    DOB: June 15, 1965, 49 y.o.   MRN: [id number]  Tanya Nguyen is a pleasant 49 y.o. year old female who presents to clinic today with Headache  on 05/30/2014  HPI: Headache- different than her other headaches that she has had for years- has had tension headaches and medication over use headaches- see previous notes- was overusing BC powders. No longer taking BC powders or other NAIDS- only tylenol and as needed Tramadol.  5 or 6 days ago- 10/10 frontal headache associated with nausea (no vomiting) and photophobia. Right now it is a 7/7.  Nothing seems to make it better. No other focal neurological symptoms.  Current Outpatient Prescriptions on File Prior to Visit  Medication Sig Dispense Refill  . ALPRAZolam (XANAX) 0.25 MG tablet Take 1 tablet (0.25 mg total) by mouth 2 (two) times daily as needed for anxiety. 20 tablet 0  . cetirizine (ZYRTEC) 10 MG tablet Take 10 mg by mouth daily.    . citalopram (CELEXA) 40 MG tablet Take 1 tablet (40 mg total) by mouth daily. 30 tablet 3  . dexlansoprazole (DEXILANT) 60 MG capsule Take 60 mg by mouth daily.    Marland Kitchen levothyroxine (SYNTHROID, LEVOTHROID) 100 MCG tablet Take 1 tablet (100 mcg total) by mouth daily. 90 tablet 3  . omeprazole (PRILOSEC) 40 MG capsule Take 1 capsule (40 mg total) by mouth daily. 30 capsule 5  . tamsulosin (FLOMAX) 0.4 MG CAPS capsule Take 1 capsule (0.4 mg total) by mouth daily. 30 capsule 3  . traMADol (ULTRAM) 50 MG tablet TAKE ONE TABLET EVERY EIGHT HOURS AS NEEDED 30 tablet 0  . traZODone (DESYREL) 50 MG tablet Take 1 tablet (50 mg total) by mouth at bedtime. 30 tablet 6  . venlafaxine (EFFEXOR) 75 MG tablet TAKE ONE TABLET TWICE DAILY 60 tablet 6   No current facility-administered medications on file prior to visit.    Allergies  Allergen Reactions  . Nsaids     Other reaction(s): OTHER  . Sulfa Antibiotics Nausea And Vomiting    Past Medical History  Diagnosis  Date  . GERD (gastroesophageal reflux disease)   . Migraine headache   . Hypothyroidism   . Depression   . Bleeding duodenal ulcer   . Kidney stones     Past Surgical History  Procedure Laterality Date  . Abdominal hysterectomy  2004    partial  . Cholecystectomy    . Tooth extraction      Family History  Problem Relation Age of Onset  . Cancer Mother   . Cancer Father   . Alcohol abuse Sister   . Alcohol abuse Maternal Grandmother   . Stroke Maternal Grandmother   . Stroke Paternal Grandmother     History   Social History  . Marital Status: Married    Spouse Name: N/A  . Number of Children: N/A  . Years of Education: N/A   Occupational History  . Not on file.   Social History Main Topics  . Smoking status: Never Smoker   . Smokeless tobacco: Never Used  . Alcohol Use: 0.0 oz/week    0 Standard drinks or equivalent per week  . Drug Use: No  . Sexual Activity: Yes   Other Topics Concern  . Not on file   Social History Narrative   The PMH, PSH, Social History, Family History, Medications, and allergies have been reviewed in [facility], and have been updated if relevant.   Review of  Systems  Constitutional: Positive for appetite change.  Eyes: Positive for photophobia. Negative for visual disturbance.  Gastrointestinal: Positive for nausea. Negative for vomiting.  Endocrine: Negative.   Musculoskeletal: Negative.   Skin: Negative.   Neurological: Positive for headaches. Negative for dizziness, tremors, seizures, syncope, facial asymmetry, speech difficulty, weakness, light-headedness and numbness.  Hematological: Negative.   All other systems reviewed and are negative.      Objective:    BP 126/88 mmHg  Pulse 96  Temp(Src) 98.2 F (36.8 C) (Oral)  Wt 117 lb 4 oz (53.184 kg)  SpO2 97%   Physical Exam  Constitutional: She is oriented to person, place, and time. She appears well-developed and well-nourished. No distress.  HENT:  Head: Normocephalic.    Eyes: Conjunctivae are normal.  + photophobia  Neck: Neck supple.  Cardiovascular: Normal rate.   Pulmonary/Chest: Effort normal.  Musculoskeletal: Normal range of motion.  Neurological: She is alert and oriented to person, place, and time. She displays normal reflexes. No cranial nerve deficit. She exhibits normal muscle tone. Coordination normal.  Skin: Skin is warm and dry.  Psychiatric: She has a normal mood and affect. Her behavior is normal. Judgment and thought content normal.  Nursing note and vitals reviewed.         Assessment & Plan:   Other type of nonintractable migraine No Follow-up on file.

## 2014-05-30 NOTE — Patient Instructions (Signed)
Good to see you. Please take Imitrex as needed for severe headache- the ones with sensitivity to light, nausea, etc. Please call us on Monday with an update. If your symptoms get worse over the weekend, go to the ER.  Zofran as needed for nausea.

## 2014-05-30 NOTE — Progress Notes (Signed)
Pre visit review using our clinic review tool, if applicable. No additional management support is needed unless otherwise documented below in the visit note. 

## 2014-05-30 NOTE — Assessment & Plan Note (Signed)
New- >25 minutes spent in face to face time with patient, >50% spent in counselling or coordination of care Discussed migraines- advised to keep a headache journal. IM toradol 60 mg given in office today. eRx sent for imitrex prn (discussed how to use this and avoid overuse), along with zofran prn nausea. Call or return to clinic prn if these symptoms worsen or fail to improve as anticipated. The patient indicates understanding of these issues and agrees with the plan.

## 2014-06-05 ENCOUNTER — Other Ambulatory Visit: Payer: Self-pay | Admitting: Family Medicine

## 2014-06-05 DIAGNOSIS — E038 Other specified hypothyroidism: Secondary | ICD-10-CM

## 2014-06-06 ENCOUNTER — Telehealth: Payer: Self-pay | Admitting: Family Medicine

## 2014-06-06 DIAGNOSIS — R928 Other abnormal and inconclusive findings on diagnostic imaging of breast: Secondary | ICD-10-CM

## 2014-06-06 NOTE — Telephone Encounter (Signed)
Order placed

## 2014-06-06 NOTE — Telephone Encounter (Signed)
[facility] needs order for Right Breast US. Please order. Thanks!

## 2014-06-10 ENCOUNTER — Other Ambulatory Visit: Payer: 59

## 2014-06-11 ENCOUNTER — Other Ambulatory Visit: Payer: Self-pay | Admitting: Family Medicine

## 2014-06-11 NOTE — Telephone Encounter (Signed)
Last office visit 05/30/2014.  Last refilled 05/30/2014 for #10 with no refills.  Ok to refill?

## 2014-06-12 ENCOUNTER — Other Ambulatory Visit (INDEPENDENT_AMBULATORY_CARE_PROVIDER_SITE_OTHER): Payer: 59

## 2014-06-12 DIAGNOSIS — E038 Other specified hypothyroidism: Secondary | ICD-10-CM

## 2014-06-12 LAB — TSH: TSH: 0.43 u[IU]/mL (ref 0.35–4.50)

## 2014-06-12 LAB — T4, FREE: Free T4: 0.93 ng/dL (ref 0.60–1.60)

## 2014-06-13 ENCOUNTER — Ambulatory Visit
Admit: 2014-06-13 | Disposition: A | Discharge status: Home / Self Care | Payer: Self-pay | Attending: Family Medicine | Admitting: Family Medicine

## 2014-06-13 ENCOUNTER — Encounter: Payer: Self-pay | Admitting: *Deleted

## 2014-06-14 ENCOUNTER — Encounter: Payer: Self-pay | Admitting: Family Medicine

## 2014-06-26 ENCOUNTER — Other Ambulatory Visit: Payer: Self-pay | Admitting: Family Medicine

## 2014-06-26 NOTE — Telephone Encounter (Signed)
Last f/u appt 05/2014-CPE 

## 2014-06-27 NOTE — Telephone Encounter (Signed)
Rx called in to requested pharmacy 

## 2014-07-02 ENCOUNTER — Other Ambulatory Visit: Payer: Self-pay | Admitting: Family Medicine

## 2014-07-16 ENCOUNTER — Other Ambulatory Visit: Payer: Self-pay | Admitting: Family Medicine

## 2014-07-29 ENCOUNTER — Other Ambulatory Visit: Payer: Self-pay | Admitting: Family Medicine

## 2014-07-29 NOTE — Telephone Encounter (Signed)
Rx called in to requested pharmacy 

## 2014-07-29 NOTE — Telephone Encounter (Signed)
Last f/u appt 05/2014-CPE 

## 2014-08-13 ENCOUNTER — Ambulatory Visit (INDEPENDENT_AMBULATORY_CARE_PROVIDER_SITE_OTHER): Payer: 59 | Admitting: Family Medicine

## 2014-08-13 ENCOUNTER — Encounter: Payer: Self-pay | Admitting: Family Medicine

## 2014-08-13 ENCOUNTER — Other Ambulatory Visit: Payer: Self-pay | Admitting: Family Medicine

## 2014-08-13 VITALS — BP 122/80 | HR 89 | Temp 97.5°F | Wt 120.0 lb

## 2014-08-13 DIAGNOSIS — G43809 Other migraine, not intractable, without status migrainosus: Secondary | ICD-10-CM

## 2014-08-13 MED ORDER — TOPIRAMATE 25 MG PO TABS: 25.0000 mg | ORAL_TABLET | Freq: Every day | ORAL | Status: DC

## 2014-08-13 MED ORDER — KETOROLAC TROMETHAMINE 60 MG/2ML IM SOLN
60.0000 mg | Freq: Once | INTRAMUSCULAR | Status: AC
  Administered 2014-08-13: 60 mg via INTRAMUSCULAR

## 2014-08-13 NOTE — Addendum Note (Signed)
Addended by: Modena Nunnery on: 08/13/2014 09:32 AM   Modules accepted: Orders

## 2014-08-13 NOTE — Progress Notes (Signed)
Pre visit review using our clinic review tool, if applicable. No additional management support is needed unless otherwise documented below in the visit note. 

## 2014-08-13 NOTE — Assessment & Plan Note (Signed)
Deteriorated and I suspect she is still having tension headaches as well. >25 minutes spent in face to face time with patient, >50% spent in counselling or coordination of care Encouraged her to keep a journal. eRx sent for Topamax 25 mg nightly for prophylaxis, continue imitrex as needed for abortive therapy- counseled on medication overuse/rebound headaches. IM toradol 60 mg given in office for acute headache. Follow up in 2 weeks. The patient indicates understanding of these issues and agrees with the plan.

## 2014-08-13 NOTE — Progress Notes (Signed)
Subjective:   Patient ID: Tanya Nguyen, female    DOB: [birth year], 49 y.o.   MRN: [id number]  Tanya Nguyen is a pleasant 48 y.o. year old female who presents to clinic today with Migraine  on 08/13/2014  HPI: Headaches-  Saw her on 05/30/14 for headaches.  Note reviewed- at that time, she was developing new types of headaches- has had tension headaches and medication over use headaches- see previous notes- was overusing BC powders. No longer taking BC powders or other NAIDS- only tylenol and as needed Tramadol.  Was starting to develop migraines-  10/10 frontal headache associated with nausea (no vomiting) and photophobia.  No other focal neurological symptoms.  eRx given for imitrex and advised keeping a HA journal.  She did not keep a HA journal but she feels incidence of migraines are worsening now that her mom's health is failing.  Imitrex does help but now taking 4 or 5 a month.  Still associated with nausea and photophobia.  Has a headache now.  Current Outpatient Prescriptions on File Prior to Visit  Medication Sig Dispense Refill  . ALPRAZolam (XANAX) 0.25 MG tablet Take 1 tablet (0.25 mg total) by mouth 2 (two) times daily as needed for anxiety. 20 tablet 0  . cetirizine (ZYRTEC) 10 MG tablet Take 10 mg by mouth daily.    . citalopram (CELEXA) 40 MG tablet Take 1 tablet (40 mg total) by mouth daily. 30 tablet 3  . dexlansoprazole (DEXILANT) 60 MG capsule Take 60 mg by mouth daily.    Marland Kitchen levothyroxine (SYNTHROID, LEVOTHROID) 100 MCG tablet TAKE ONE (1) TABLET EACH DAY 90 tablet 1  . omeprazole (PRILOSEC) 40 MG capsule Take 1 capsule (40 mg total) by mouth daily. 30 capsule 5  . ondansetron (ZOFRAN) 4 MG tablet TAKE ONE TABLET BY MOUTH EVERY 8 HOURS AS NEEDED FOR OR VOMITING 20 tablet 3  . SUMAtriptan (IMITREX) 25 MG tablet TAKE 1 TABLET BY MOUTH EVERY 2 HOURS AS NEEDED FOR MIGRAINE.  MAY REPEAT IN 2 HOURS IF HEADACHE PERSISTS OR RECURS 10 tablet 3  . tamsulosin (FLOMAX) 0.4 MG  CAPS capsule Take 1 capsule (0.4 mg total) by mouth daily. 30 capsule 3  . traMADol (ULTRAM) 50 MG tablet TAKE ONE TABLET BY MOUTH EVERY EIGHT HOURS AS NEEDED 30 tablet 0  . traZODone (DESYREL) 50 MG tablet Take 1 tablet (50 mg total) by mouth at bedtime. 30 tablet 6  . venlafaxine (EFFEXOR) 75 MG tablet TAKE ONE TABLET TWICE DAILY 60 tablet 6   No current facility-administered medications on file prior to visit.    Allergies  Allergen Reactions  . Nsaids     Other reaction(s): OTHER  . Sulfa Antibiotics Nausea And Vomiting    Past Medical History  Diagnosis Date  . GERD (gastroesophageal reflux disease)   . Migraine headache   . Hypothyroidism   . Depression   . Bleeding duodenal ulcer   . Kidney stones     Past Surgical History  Procedure Laterality Date  . Abdominal hysterectomy  2004    partial  . Cholecystectomy    . Tooth extraction      Family History  Problem Relation Age of Onset  . Cancer Mother   . Cancer Father   . Alcohol abuse Sister   . Alcohol abuse Maternal Grandmother   . Stroke Maternal Grandmother   . Stroke Paternal Grandmother     History   Social History  . Marital Status: Married  Spouse Name: N/A  . Number of Children: N/A  . Years of Education: N/A   Occupational History  . Not on file.   Social History Main Topics  . Smoking status: Never Smoker   . Smokeless tobacco: Never Used  . Alcohol Use: 0.0 oz/week    0 Standard drinks or equivalent per week  . Drug Use: No  . Sexual Activity: Yes   Other Topics Concern  . Not on file   Social History Narrative   The PMH, PSH, Social History, Family History, Medications, and allergies have been reviewed in [facility], and have been updated if relevant.   Review of Systems  Constitutional: Positive for appetite change.  Eyes: Positive for photophobia. Negative for visual disturbance.  Gastrointestinal: Positive for nausea. Negative for vomiting.  Endocrine: Negative.     Musculoskeletal: Negative.   Skin: Negative.   Neurological: Positive for headaches. Negative for dizziness, tremors, seizures, syncope, facial asymmetry, speech difficulty, weakness, light-headedness and numbness.  Hematological: Negative.   All other systems reviewed and are negative.      Objective:    BP 122/80 mmHg  Pulse 89  Temp(Src) 97.5 F (36.4 C) (Oral)  Wt 120 lb (54.432 kg)  SpO2 98%   Physical Exam  Constitutional: She is oriented to person, place, and time. She appears well-developed and well-nourished. No distress.  HENT:  Head: Normocephalic.  Eyes: Conjunctivae are normal.  + photophobia  Neck: Neck supple.  Cardiovascular: Normal rate.   Pulmonary/Chest: Effort normal.  Musculoskeletal: Normal range of motion.  Neurological: She is alert and oriented to person, place, and time. She displays normal reflexes. No cranial nerve deficit. She exhibits normal muscle tone. Coordination normal.  Skin: Skin is warm and dry.  Psychiatric: She has a normal mood and affect. Her behavior is normal. Judgment and thought content normal.  Nursing note and vitals reviewed.         Assessment & Plan:   Other type of nonintractable migraine No Follow-up on file.

## 2014-08-13 NOTE — Patient Instructions (Signed)
Good to see you. We are starting topamax 25 mg nightly. Make an appointment on your way out for 30 minutes in 2 weeks.

## 2014-08-22 ENCOUNTER — Other Ambulatory Visit: Payer: Self-pay | Admitting: Family Medicine

## 2014-08-23 NOTE — Telephone Encounter (Signed)
Rx called in to requested pharmacy 

## 2014-08-23 NOTE — Telephone Encounter (Signed)
Is pt needing ov to restart? Only received one Rx 03/2014

## 2014-08-26 ENCOUNTER — Ambulatory Visit (INDEPENDENT_AMBULATORY_CARE_PROVIDER_SITE_OTHER): Payer: 59 | Admitting: Family Medicine

## 2014-08-26 ENCOUNTER — Encounter: Payer: Self-pay | Admitting: Family Medicine

## 2014-08-26 VITALS — BP 124/74 | HR 99 | Temp 97.8°F | Wt 123.5 lb

## 2014-08-26 DIAGNOSIS — K5900 Constipation, unspecified: Secondary | ICD-10-CM

## 2014-08-26 DIAGNOSIS — R300 Dysuria: Secondary | ICD-10-CM | POA: Diagnosis not present

## 2014-08-26 DIAGNOSIS — G43809 Other migraine, not intractable, without status migrainosus: Secondary | ICD-10-CM

## 2014-08-26 LAB — POCT URINALYSIS DIPSTICK
BILIRUBIN UA: NEGATIVE
Blood, UA: NEGATIVE
Glucose, UA: NEGATIVE
KETONES UA: NEGATIVE
Leukocytes, UA: NEGATIVE
Nitrite, UA: NEGATIVE
PH UA: 6
Protein, UA: NEGATIVE
Spec Grav, UA: >=1.03
Urobilinogen, UA: 0.2

## 2014-08-26 MED ORDER — LINACLOTIDE 145 MCG PO CAPS: 145.0000 ug | ORAL_CAPSULE | Freq: Every day | ORAL | Status: DC

## 2014-08-26 NOTE — Progress Notes (Signed)
Subjective:   Patient ID: Tanya Nguyen, female    DOB: 1965/10/13, 49 y.o.   MRN: [id number]  Tanya Nguyen is a pleasant 49 y.o. year old female who presents to clinic today with Follow-up and Dysuria  on 08/26/2014  HPI: Headaches-  Saw her on 05/30/14 for headaches.  Note reviewed- at that time, she was developing new types of headaches- has had tension headaches and medication over use headaches- see previous notes- was overusing BC powders. No longer taking BC powders or other NAIDS- only tylenol and as needed Tramadol.  Was starting to develop migraines-  10/10 frontal headache associated with nausea (no vomiting) and photophobia.  No other focal neurological symptoms.  eRx given for imitrex and advised keeping a HA journal.  She did not keep a HA journal but she feels incidence of migraines are worsening now that her mom's health is failing.  Imitrex does help but now taking 4 or 5 a month.  Still associated with nausea and photophobia.  Has a headache now.  Started imitrex last month.  She feels this has helped tremendously.  Dysuria- woke up with morning with increased urinary frequency and dysuria.  No back pain.  No nausea, vomiting or fevers.  Constipation- feels "nothing is working"  Happens more when she is anxious.  Has tried dulcolax, miralax and fiber.  Feels bloated.  Current Outpatient Prescriptions on File Prior to Visit  Medication Sig Dispense Refill  . ALPRAZolam (XANAX) 0.25 MG tablet TAKE ONE TABLET TWICE DAILY AS NEEDED FOR ANXIETY. ONLY FOR SHORT TERM USE 60 tablet 0  . cetirizine (ZYRTEC) 10 MG tablet Take 10 mg by mouth daily.    . citalopram (CELEXA) 40 MG tablet TAKE ONE (1) TABLET EACH DAY 30 tablet 5  . dexlansoprazole (DEXILANT) 60 MG capsule Take 60 mg by mouth daily.    Marland Kitchen levothyroxine (SYNTHROID, LEVOTHROID) 100 MCG tablet TAKE ONE (1) TABLET EACH DAY 90 tablet 1  . omeprazole (PRILOSEC) 40 MG capsule Take 1 capsule (40 mg total) by mouth daily.  30 capsule 5  . ondansetron (ZOFRAN) 4 MG tablet TAKE ONE TABLET BY MOUTH EVERY 8 HOURS AS NEEDED FOR OR VOMITING 20 tablet 3  . SUMAtriptan (IMITREX) 25 MG tablet TAKE 1 TABLET BY MOUTH EVERY 2 HOURS AS NEEDED FOR MIGRAINE.  MAY REPEAT IN 2 HOURS IF HEADACHE PERSISTS OR RECURS 10 tablet 3  . tamsulosin (FLOMAX) 0.4 MG CAPS capsule Take 1 capsule (0.4 mg total) by mouth daily. 30 capsule 3  . topiramate (TOPAMAX) 25 MG tablet Take 1 tablet (25 mg total) by mouth at bedtime. 30 tablet 3  . traMADol (ULTRAM) 50 MG tablet TAKE ONE TABLET BY MOUTH EVERY EIGHT HOURS AS NEEDED 30 tablet 0  . traZODone (DESYREL) 50 MG tablet Take 1 tablet (50 mg total) by mouth at bedtime. 30 tablet 6  . venlafaxine (EFFEXOR) 75 MG tablet TAKE ONE TABLET TWICE DAILY 60 tablet 6   No current facility-administered medications on file prior to visit.    Allergies  Allergen Reactions  . Nsaids     Other reaction(s): OTHER  . Sulfa Antibiotics Nausea And Vomiting    Past Medical History  Diagnosis Date  . GERD (gastroesophageal reflux disease)   . Migraine headache   . Hypothyroidism   . Depression   . Bleeding duodenal ulcer   . Kidney stones     Past Surgical History  Procedure Laterality Date  . Abdominal hysterectomy  2004  partial  . Cholecystectomy    . Tooth extraction      Family History  Problem Relation Age of Onset  . Cancer Mother   . Cancer Father   . Alcohol abuse Sister   . Alcohol abuse Maternal Grandmother   . Stroke Maternal Grandmother   . Stroke Paternal Grandmother     History   Social History  . Marital Status: Married    Spouse Name: N/A  . Number of Children: N/A  . Years of Education: N/A   Occupational History  . Not on file.   Social History Main Topics  . Smoking status: Never Smoker   . Smokeless tobacco: Never Used  . Alcohol Use: 0.0 oz/week    0 Standard drinks or equivalent per week  . Drug Use: No  . Sexual Activity: Yes   Other Topics Concern    . Not on file   Social History Narrative   The PMH, PSH, Social History, Family History, Medications, and allergies have been reviewed in [facility], and have been updated if relevant.   Review of Systems  Constitutional: Positive for appetite change.  Eyes: Positive for photophobia. Negative for visual disturbance.  Gastrointestinal: Positive for constipation and abdominal distention. Negative for nausea, vomiting, abdominal pain and diarrhea.  Endocrine: Negative.   Musculoskeletal: Negative.   Skin: Negative.   Neurological: Positive for headaches. Negative for dizziness, tremors, seizures, syncope, facial asymmetry, speech difficulty, weakness, light-headedness and numbness.  Hematological: Negative.   All other systems reviewed and are negative.      Objective:    BP 124/74 mmHg  Pulse 99  Temp(Src) 97.8 F (36.6 C) (Oral)  Wt 123 lb 8 oz (56.019 kg)  SpO2 99%   Physical Exam  Constitutional: She is oriented to person, place, and time. She appears well-developed and well-nourished. No distress.  HENT:  Head: Normocephalic.  Eyes: Conjunctivae are normal.  + photophobia  Neck: Neck supple.  Cardiovascular: Normal rate.   Pulmonary/Chest: Effort normal.  Abdominal: Soft. Bowel sounds are normal. She exhibits no distension. There is no tenderness.  Musculoskeletal: Normal range of motion.  Neurological: She is alert and oriented to person, place, and time. She displays normal reflexes. No cranial nerve deficit. She exhibits normal muscle tone. Coordination normal.  Skin: Skin is warm and dry.  Psychiatric: She has a normal mood and affect. Her behavior is normal. Judgment and thought content normal.  Nursing note and vitals reviewed.         Assessment & Plan:   Dysuria - Plan: Urinalysis Dipstick  Other type of nonintractable migraine  Constipation, unspecified constipation type No Follow-up on file.

## 2014-08-26 NOTE — Progress Notes (Signed)
Pre visit review using our clinic review tool, if applicable. No additional management support is needed unless otherwise documented below in the visit note. 

## 2014-08-26 NOTE — Assessment & Plan Note (Signed)
Improved. Continue current rxs.

## 2014-08-26 NOTE — Assessment & Plan Note (Signed)
Deteriorated. Trial of Linzess- advised to push fluids. Call or return to clinic prn if these symptoms worsen or fail to improve as anticipated. The patient indicates understanding of these issues and agrees with the plan.

## 2014-08-26 NOTE — Assessment & Plan Note (Signed)
New- Neg UA. Reassurance provided. Frequency may be due to bladder irritants... Drink water, avoid alcohol, caffeine, soda, citris, tomato, spicy foods.

## 2014-08-29 ENCOUNTER — Telehealth: Payer: Self-pay | Admitting: Family Medicine

## 2014-08-29 MED ORDER — TOPIRAMATE 50 MG PO TABS: 50.0000 mg | ORAL_TABLET | Freq: Two times a day (BID) | ORAL | Status: DC

## 2014-08-29 NOTE — Telephone Encounter (Signed)
Let's increase dose of Topamax to 50 mg qhs- ok to take two of her 25 mg tablets until she runs out.  I will go ahead and send new rx to her [name].  Please call pt.

## 2014-08-29 NOTE — Telephone Encounter (Signed)
[facility] Patient Name: Tanya Nguyen DOB: January 13, 1966 Initial Comment [name] states having bad migraines, wants to know if she can increase her med. Nurse Assessment Nurse: Erlene Quan, RN, Manuela Schwartz Date/Time Eilene Ghazi Time): 08/29/2014 11:31:17 AM Confirm and document reason for call. If symptomatic, describe symptoms. ---[name] states having bad migraines, wants to know if she can increase her medication - states she saw the doctor on Tuesday and thought she was feeling better but she got up the next day and it was hurting again - states she is taking topiramate 25 mg at bedtime started June 7 - she has Imitrex also but she took her last one this morning - she has been taking those after she was in severe pain thinking it would stop the pain - she just wants to see what Dr Deborra Medina wants her to do she has already been in 2 times - she told her she may have to increase the bedtime medicine - caller also states the medicine she wanted her to take for constipation cost 350.00 and she can not afford that - Dr Deborra Medina was going to call her insurance company but not sure if she has or not - can she please get someone to just give her a call back about all this ? Has the patient traveled out of the country within the last 30 days? ---Not Applicable Does the patient require triage? ---Declined Triage Please document clinical information provided and list any resource used. ---Advised caller I will message the office and someone will give her a call back - telephone note will be made and sent over - advised caller if her symptoms worsen before she gets a call back then call us back

## 2014-08-29 NOTE — Telephone Encounter (Signed)
There is no cheaper alternative that I am aware of other than continuing with miralax.  We can certainly refer her to GI for further evaluation and treatment.

## 2014-08-29 NOTE — Telephone Encounter (Signed)
Pt is still having horrible headaches,, she says they are worse than a 10, medication not helping.  Pt request cb at cell number

## 2014-08-29 NOTE — Telephone Encounter (Signed)
Tanya Nguyen, please call pt to get more information. Thanks!

## 2014-08-29 NOTE — Telephone Encounter (Signed)
Pt also needs prior auth for "bathroom pills", pt can not afford them otherwise.

## 2014-08-29 NOTE — Telephone Encounter (Signed)
Spoke to pt who did not know the name of the medication in reference. She states that she received the Rx when she was in office 06/20. Only med prescribed was Linzess. Pt states that even with insurance medication is still  $300+. Pt is requesting alternate medication sent to pharmacy

## 2014-08-30 NOTE — Telephone Encounter (Signed)
Spoke to pt and advised per Dr Deborra Medina. Pt is questioning if she may have a prescription strength suppository

## 2014-08-30 NOTE — Telephone Encounter (Signed)
They are over the counter, like dulcolax suppositories.  Does she want a GI referral?

## 2014-08-30 NOTE — Telephone Encounter (Signed)
Lm on pts vm and advised of OTC suppositories. Pt is not wanting referral

## 2014-09-04 ENCOUNTER — Other Ambulatory Visit: Payer: Self-pay | Admitting: Family Medicine

## 2014-09-04 NOTE — Telephone Encounter (Signed)
Rx called in to requested pharmacy 

## 2014-09-04 NOTE — Telephone Encounter (Signed)
Last f/u appt 08/2014 

## 2014-09-04 NOTE — Telephone Encounter (Signed)
Pt called back and another doctor years ago gave pt Glycerin suppositories; these suppositories helped a lot.  Pt request cb.

## 2014-09-04 NOTE — Telephone Encounter (Signed)
Pt left v/m; pt requesting prescription suppository being sent to [facility]. The OTC meds do not help pt. Unable to reach pt by phone for name of a suppository. Pt in v/m requested cb.

## 2014-09-04 NOTE — Telephone Encounter (Signed)
Those are OTC as well.

## 2014-09-04 NOTE — Telephone Encounter (Signed)
Spoke to pt and advised per Dr Deborra Medina; pt verbally expressed understanding. Advised pt to ask pharmacist for help if unable to locate within pharmacy

## 2014-10-16 ENCOUNTER — Other Ambulatory Visit: Payer: Self-pay | Admitting: Family Medicine

## 2014-12-02 ENCOUNTER — Other Ambulatory Visit: Payer: Self-pay | Admitting: Family Medicine

## 2014-12-03 NOTE — Telephone Encounter (Signed)
Rx called in to requested pharmacy 

## 2014-12-03 NOTE — Telephone Encounter (Signed)
Last f/u appt 08/2014 

## 2014-12-05 ENCOUNTER — Ambulatory Visit (INDEPENDENT_AMBULATORY_CARE_PROVIDER_SITE_OTHER): Payer: 59

## 2014-12-05 DIAGNOSIS — Z23 Encounter for immunization: Secondary | ICD-10-CM

## 2015-01-20 ENCOUNTER — Encounter: Payer: Self-pay | Admitting: Family Medicine

## 2015-01-20 ENCOUNTER — Ambulatory Visit (INDEPENDENT_AMBULATORY_CARE_PROVIDER_SITE_OTHER): Payer: 59 | Admitting: Family Medicine

## 2015-01-20 VITALS — BP 116/66 | HR 93 | Temp 97.4°F | Wt 127.5 lb

## 2015-01-20 DIAGNOSIS — E038 Other specified hypothyroidism: Secondary | ICD-10-CM

## 2015-01-20 DIAGNOSIS — F32A Depression, unspecified: Secondary | ICD-10-CM

## 2015-01-20 DIAGNOSIS — R5383 Other fatigue: Secondary | ICD-10-CM | POA: Diagnosis not present

## 2015-01-20 DIAGNOSIS — F329 Major depressive disorder, single episode, unspecified: Secondary | ICD-10-CM

## 2015-01-20 LAB — H. PYLORI ANTIBODY, IGG: H Pylori IgG: NEGATIVE

## 2015-01-20 NOTE — Progress Notes (Signed)
Subjective:    Patient ID: Tanya Nguyen, female    DOB: December 15, 1965, 49 y.o.   MRN: [id number]  HPI  Chronic complaint for years.  Sleeps ok with trazodone. Does not feel more depressed unless she is at home for long periods of time because she worries about her mom. Past four months, increased fatigue, muscle aches.  Denies feeling depressed.  Takes synthroid 75 mcg daily.  Lab Results  Component Value Date   TSH 0.43 06/12/2014   Has had more constipation.  Taking Miralax. Denies cold intolerance.  Lab Results  Component Value Date   WBC 4.9 05/09/2014   HGB 14.0 05/09/2014   HCT 41.2 05/09/2014   MCV 87.9 05/09/2014   PLT 245.0 05/09/2014   Lab Results  Component Value Date   VITAMINB12 1310* 04/02/2014    Current Outpatient Prescriptions on File Prior to Visit  Medication Sig Dispense Refill  . ALPRAZolam (XANAX) 0.25 MG tablet TAKE ONE TABLET TWICE DAILY AS NEEDED FOR ANXIETY. ONLY FOR SHORT TERM USE 60 tablet 0  . cetirizine (ZYRTEC) 10 MG tablet Take 10 mg by mouth daily.    . citalopram (CELEXA) 40 MG tablet TAKE ONE (1) TABLET EACH DAY 30 tablet 5  . dexlansoprazole (DEXILANT) 60 MG capsule Take 60 mg by mouth daily.    Marland Kitchen levothyroxine (SYNTHROID, LEVOTHROID) 100 MCG tablet TAKE ONE (1) TABLET EACH DAY 90 tablet 1  . Linaclotide (LINZESS) 145 MCG CAPS capsule Take 1 capsule (145 mcg total) by mouth daily. 30 capsule 3  . omeprazole (PRILOSEC) 40 MG capsule TAKE ONE (1) CAPSULE EACH DAY 30 capsule 11  . ondansetron (ZOFRAN) 4 MG tablet TAKE ONE TABLET BY MOUTH EVERY 8 HOURS AS NEEDED FOR OR VOMITING 20 tablet 3  . SUMAtriptan (IMITREX) 25 MG tablet TAKE ONE TABLET BY MOUTH EVERY 2 HOURS AS NEEDED FOR MIGRAINE. MAY REPEAT IN 2 HOURS IF HEADACHE PERSISTS OR RECURS 10 tablet 5  . tamsulosin (FLOMAX) 0.4 MG CAPS capsule Take 1 capsule (0.4 mg total) by mouth daily. 30 capsule 3  . topiramate (TOPAMAX) 50 MG tablet Take 1 tablet (50 mg total) by mouth 2 (two) times  daily. 30 tablet 3  . traMADol (ULTRAM) 50 MG tablet TAKE ONE TABLET BY MOUTH EVERY 8 HOURS 30 tablet 0  . traZODone (DESYREL) 50 MG tablet Take 1 tablet (50 mg total) by mouth at bedtime. 30 tablet 6  . venlafaxine (EFFEXOR) 75 MG tablet TAKE ONE TABLET TWICE DAILY 60 tablet 6   No current facility-administered medications on file prior to visit.    Allergies  Allergen Reactions  . Nsaids     Other reaction(s): OTHER  . Sulfa Antibiotics Nausea And Vomiting    Past Medical History  Diagnosis Date  . GERD (gastroesophageal reflux disease)   . Migraine headache   . Hypothyroidism   . Depression   . Bleeding duodenal ulcer   . Kidney stones     Past Surgical History  Procedure Laterality Date  . Abdominal hysterectomy  2004    partial  . Cholecystectomy    . Tooth extraction      Family History  Problem Relation Age of Onset  . Cancer Mother   . Cancer Father   . Alcohol abuse Sister   . Alcohol abuse Maternal Grandmother   . Stroke Maternal Grandmother   . Stroke Paternal Grandmother     Social History   Social History  . Marital Status: Married    Spouse  Name: N/A  . Number of Children: N/A  . Years of Education: N/A   Occupational History  . Not on file.   Social History Main Topics  . Smoking status: Never Smoker   . Smokeless tobacco: Never Used  . Alcohol Use: 0.0 oz/week    0 Standard drinks or equivalent per week  . Drug Use: No  . Sexual Activity: Yes   Other Topics Concern  . Not on file   Social History Narrative   The PMH, PSH, Social History, Family History, Medications, and allergies have been reviewed in [facility], and have been updated if relevant.   Review of Systems  Constitutional: Positive for activity change and fatigue. Negative for fever, chills, diaphoresis and appetite change.  HENT: Negative.   Eyes: Negative.   Respiratory: Negative.   Cardiovascular: Negative.   Gastrointestinal: Negative.   Endocrine: Negative.     Genitourinary: Negative.   Musculoskeletal: Positive for myalgias and arthralgias.  Psychiatric/Behavioral: The patient is not nervous/anxious.   All other systems reviewed and are negative.      Objective:   Physical Exam  Constitutional: She is oriented to person, place, and time. She appears well-developed and well-nourished. No distress.  HENT:  Head: Normocephalic and atraumatic.  Eyes: Pupils are equal, round, and reactive to light.  Neck: Normal range of motion. Neck supple. No thyromegaly present.  Cardiovascular: Normal rate, regular rhythm and normal heart sounds.   Pulmonary/Chest: Effort normal and breath sounds normal.  Abdominal: Soft. Bowel sounds are normal.  Neurological: She is alert and oriented to person, place, and time.  Skin: Skin is warm and dry.  Psychiatric: She has a normal mood and affect. Her behavior is normal. Judgment and thought content normal.  Nursing note and vitals reviewed.  BP 116/66 mmHg  Pulse 93  Temp(Src) 97.4 F (36.3 C) (Oral)  Wt 127 lb 8 oz (57.834 kg)  SpO2 98%       Assessment & Plan:

## 2015-01-20 NOTE — Progress Notes (Signed)
Pre visit review using our clinic review tool, if applicable. No additional management support is needed unless otherwise documented below in the visit note. 

## 2015-01-20 NOTE — Assessment & Plan Note (Signed)
>  25 minutes spent in face to face time with patient, >50% spent in counselling or coordination of care Recently deteriorated. Start with lab work today-  Orders Placed This Encounter  Procedures  . H. pylori antibody, IgG  . Vitamin B12  . VITAMIN D 25 Hydroxy (Vit-D Deficiency, Fractures)  . TSH  . T4, free

## 2015-01-21 LAB — VITAMIN D 25 HYDROXY (VIT D DEFICIENCY, FRACTURES): VITD: 29.83 ng/mL — AB (ref 30.00–100.00)

## 2015-01-21 LAB — T4, FREE: FREE T4: 0.76 ng/dL (ref 0.60–1.60)

## 2015-01-21 LAB — TSH: TSH: 2.58 u[IU]/mL (ref 0.35–4.50)

## 2015-01-21 LAB — VITAMIN B12: Vitamin B-12: 1455 pg/mL — ABNORMAL HIGH (ref 211–911)

## 2015-01-22 ENCOUNTER — Telehealth: Payer: Self-pay | Admitting: Family Medicine

## 2015-01-22 MED ORDER — VITAMIN D (ERGOCALCIFEROL) 1.25 MG (50000 UNIT) PO CAPS: 50000.0000 [IU] | ORAL_CAPSULE | ORAL | Status: DC

## 2015-01-22 NOTE — Telephone Encounter (Signed)
Patient returned Waynetta's call. °

## 2015-01-22 NOTE — Addendum Note (Signed)
Addended by: Modena Nunnery on: 01/22/2015 04:18 PM   Modules accepted: Orders

## 2015-01-22 NOTE — Telephone Encounter (Signed)
See results note. 

## 2015-01-28 ENCOUNTER — Ambulatory Visit (INDEPENDENT_AMBULATORY_CARE_PROVIDER_SITE_OTHER): Payer: 59 | Admitting: Family Medicine

## 2015-01-28 ENCOUNTER — Encounter: Payer: Self-pay | Admitting: Family Medicine

## 2015-01-28 ENCOUNTER — Encounter (INDEPENDENT_AMBULATORY_CARE_PROVIDER_SITE_OTHER): Payer: Self-pay

## 2015-01-28 ENCOUNTER — Telehealth: Payer: Self-pay | Admitting: Family Medicine

## 2015-01-28 VITALS — BP 118/78 | HR 97 | Temp 98.3°F | Wt 126.0 lb

## 2015-01-28 DIAGNOSIS — F32A Depression, unspecified: Secondary | ICD-10-CM

## 2015-01-28 DIAGNOSIS — F329 Major depressive disorder, single episode, unspecified: Secondary | ICD-10-CM

## 2015-01-28 MED ORDER — VENLAFAXINE HCL 75 MG PO TABS: 75.0000 mg | ORAL_TABLET | Freq: Three times a day (TID) | ORAL | Status: DC

## 2015-01-28 NOTE — Patient Instructions (Signed)
Take venlafaxine 3 times a day, not just 2.  Update Dr. Deborra Medina next week.  Take care.  Glad to see you.  Thank you for your effort.  If you have any plan of hurting yourself, then please dial 911.

## 2015-01-28 NOTE — Telephone Encounter (Signed)
Pt has appt 01/28/15 at 2:30 with Dr Damita Dunnings. Pt has been taking her med and has felt for "a long time that she does not care if she lives or dies". Pt does not know why she keeps living. Pt said her mother is there with her and she does not want to go to ED and will wait and talk with Dr Damita Dunnings this afternoon. Pt did agree if her condition worsens prior to appt she will go to ED.
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
## 2015-01-28 NOTE — Telephone Encounter (Signed)
error 
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
## 2015-01-29 ENCOUNTER — Telehealth: Payer: Self-pay | Admitting: Family Medicine

## 2015-01-29 NOTE — Telephone Encounter (Signed)
See OV note.  

## 2015-01-29 NOTE — Assessment & Plan Note (Signed)
Clearly worse, needs to inc venlafaxine in meantime to TID.  D/w pt.  She agrees.  New rx given to patient.  Still okay for outpatient f/u given the contract for safety and no SI/HI currently.  She'll update [place].  Will route to PCP as FYI and for follow up.  She may end up needing psych referral if not clearly improved with this med change.  >25 minutes spent in face to face time with patient, >50% spent in counselling or coordination of care.

## 2015-01-29 NOTE — Telephone Encounter (Signed)
See OV note, please call her Monday and get update.  Thanks.

## 2015-02-05 ENCOUNTER — Telehealth: Payer: Self-pay | Admitting: Family Medicine

## 2015-02-05 NOTE — Telephone Encounter (Signed)
Noted, thanks.  Routed to PCP for input re: f/u and potential scheduling.  Thanks.

## 2015-02-05 NOTE — Telephone Encounter (Signed)
Lm on pts vm and advised to contact office to schedule f/u

## 2015-02-05 NOTE — Telephone Encounter (Signed)
Please call pt to schedule a follow up with me in the next few weeks.

## 2015-02-05 NOTE — Telephone Encounter (Signed)
Pt called to let you know that she is feeling better since you changed her meds She wanted to thank you for helping her last week.

## 2015-02-24 ENCOUNTER — Encounter: Payer: Self-pay | Admitting: Internal Medicine

## 2015-02-24 ENCOUNTER — Ambulatory Visit (INDEPENDENT_AMBULATORY_CARE_PROVIDER_SITE_OTHER): Payer: 59 | Admitting: Internal Medicine

## 2015-02-24 VITALS — BP 110/78 | HR 74 | Temp 97.7°F | Wt 126.0 lb

## 2015-02-24 DIAGNOSIS — R05 Cough: Secondary | ICD-10-CM

## 2015-02-24 DIAGNOSIS — R0989 Other specified symptoms and signs involving the circulatory and respiratory systems: Secondary | ICD-10-CM

## 2015-02-24 DIAGNOSIS — R059 Cough, unspecified: Secondary | ICD-10-CM

## 2015-02-24 DIAGNOSIS — J029 Acute pharyngitis, unspecified: Secondary | ICD-10-CM | POA: Diagnosis not present

## 2015-02-24 DIAGNOSIS — R509 Fever, unspecified: Secondary | ICD-10-CM

## 2015-02-24 MED ORDER — HYDROCODONE-HOMATROPINE 5-1.5 MG/5ML PO SYRP: 5.0000 mL | ORAL_SOLUTION | Freq: Three times a day (TID) | ORAL | Status: DC | PRN

## 2015-02-24 NOTE — Progress Notes (Signed)
Pre visit review using our clinic review tool, if applicable. No additional management support is needed unless otherwise documented below in the visit note. 

## 2015-02-24 NOTE — Progress Notes (Signed)
HPI  Pt presents to the clinic today with c/o sore throat, cough and chest congestion. This started 3-4 days ago. The cough is non productive. She has had some slight post nasal drip but denies runny nose or shortness of breath. She reports she has run fevers up to 100.3. She has taken Zyrtec and Mucinex with minimal relief. She does have a history of allergies but denies breathing problems. She has had sick contacts. She did get her flu shot.  Review of Systems      Past Medical History  Diagnosis Date  . GERD (gastroesophageal reflux disease)   . Migraine headache   . Hypothyroidism   . Depression   . Bleeding duodenal ulcer   . Kidney stones     Family History  Problem Relation Age of Onset  . Cancer Mother   . Cancer Father   . Alcohol abuse Sister   . Alcohol abuse Maternal Grandmother   . Stroke Maternal Grandmother   . Stroke Paternal Grandmother     Social History   Social History  . Marital Status: Married    Spouse Name: N/A  . Number of Children: N/A  . Years of Education: N/A   Occupational History  . Not on file.   Social History Main Topics  . Smoking status: Never Smoker   . Smokeless tobacco: Never Used  . Alcohol Use: 0.0 oz/week    0 Standard drinks or equivalent per week  . Drug Use: No  . Sexual Activity: Yes   Other Topics Concern  . Not on file   Social History Narrative    Allergies  Allergen Reactions  . Nsaids     Other reaction(s): OTHER  . Sulfa Antibiotics Nausea And Vomiting     Constitutional: Positive fatigue and fever. Denies headache,  abrupt weight changes.  HEENT:  Positive nasal congestion, sore throat. Denies eye redness, eye pain, pressure behind the eyes, facial pain, ear pain, ringing in the ears, wax buildup, runny nose or bloody nose. Respiratory: Positive cough. Denies difficulty breathing or shortness of breath.  Cardiovascular: Denies chest pain, chest tightness, palpitations or swelling in the hands or feet.    No other specific complaints in a complete review of systems (except as listed in HPI above).  Objective:   BP 110/78 mmHg  Pulse 74  Temp(Src) 97.7 F (36.5 C) (Oral)  Wt 126 lb (57.153 kg) Wt Readings from Last 3 Encounters:  02/24/15 126 lb (57.153 kg)  01/28/15 126 lb (57.153 kg)  01/20/15 127 lb 8 oz (57.834 kg)     General: Appears her stated age,  in NAD. HEENT: Head: normal shape and size, no sinus tenderness; Eyes: sclera white, no icterus, conjunctiva pink; Ears: Tm's gray and intact, normal light reflex; Nose: mucosa pink and moist, septum midline; Throat/Mouth: Teeth present, mucosa pink and moist, no exudate noted, no lesions or ulcerations noted.  Neck: Cervical lymphadenopathy noted bilaterally.  Cardiovascular: Normal rate and rhythm. S1,S2 noted.  Pulmonary/Chest: Normal effort and positive vesicular breath sounds. No respiratory distress. No wheezes, rales or ronchi noted.      Assessment & Plan:   Sore throat, cough, chest congestion and fever:  Viral illness Rapid Flu: negative Get some rest and drink plenty of water Do salt water gargles for the sore throat Rx for Hycodan cough syrup  RTC as needed or if symptoms persist.

## 2015-02-24 NOTE — Patient Instructions (Signed)

## 2015-02-26 ENCOUNTER — Emergency Department (HOSPITAL_COMMUNITY)
Admission: EM | Admit: 2015-02-26 | Discharge: 2015-02-26 | Disposition: A | Discharge status: Other Acute Inpatient Hospital | Payer: 59 | Attending: Emergency Medicine | Admitting: Emergency Medicine

## 2015-02-26 ENCOUNTER — Inpatient Hospital Stay (HOSPITAL_COMMUNITY)
Admission: AD | Admit: 2015-02-26 | Discharge: 2015-03-04 | DRG: 885 | Disposition: A | Discharge status: Home / Self Care | Payer: 59 | Source: Intra-hospital | Attending: Psychiatry | Admitting: Psychiatry

## 2015-02-26 ENCOUNTER — Telehealth: Payer: Self-pay | Admitting: Family Medicine

## 2015-02-26 ENCOUNTER — Ambulatory Visit (INDEPENDENT_AMBULATORY_CARE_PROVIDER_SITE_OTHER): Payer: 59 | Admitting: Family Medicine

## 2015-02-26 ENCOUNTER — Encounter (HOSPITAL_COMMUNITY): Payer: Self-pay | Admitting: *Deleted

## 2015-02-26 ENCOUNTER — Encounter (HOSPITAL_COMMUNITY): Payer: Self-pay

## 2015-02-26 VITALS — BP 108/62 | HR 96 | Temp 97.8°F | Wt 128.0 lb

## 2015-02-26 DIAGNOSIS — Z880 Allergy status to penicillin: Secondary | ICD-10-CM | POA: Insufficient documentation

## 2015-02-26 DIAGNOSIS — Z818 Family history of other mental and behavioral disorders: Secondary | ICD-10-CM

## 2015-02-26 DIAGNOSIS — Z87442 Personal history of urinary calculi: Secondary | ICD-10-CM | POA: Diagnosis not present

## 2015-02-26 DIAGNOSIS — F332 Major depressive disorder, recurrent severe without psychotic features: Secondary | ICD-10-CM | POA: Diagnosis not present

## 2015-02-26 DIAGNOSIS — Z79899 Other long term (current) drug therapy: Secondary | ICD-10-CM | POA: Insufficient documentation

## 2015-02-26 DIAGNOSIS — F319 Bipolar disorder, unspecified: Principal | ICD-10-CM | POA: Diagnosis present

## 2015-02-26 DIAGNOSIS — G43909 Migraine, unspecified, not intractable, without status migrainosus: Secondary | ICD-10-CM | POA: Insufficient documentation

## 2015-02-26 DIAGNOSIS — E039 Hypothyroidism, unspecified: Secondary | ICD-10-CM | POA: Insufficient documentation

## 2015-02-26 DIAGNOSIS — Z90710 Acquired absence of both cervix and uterus: Secondary | ICD-10-CM

## 2015-02-26 DIAGNOSIS — F1310 Sedative, hypnotic or anxiolytic abuse, uncomplicated: Secondary | ICD-10-CM | POA: Diagnosis not present

## 2015-02-26 DIAGNOSIS — F329 Major depressive disorder, single episode, unspecified: Secondary | ICD-10-CM | POA: Diagnosis not present

## 2015-02-26 DIAGNOSIS — K219 Gastro-esophageal reflux disease without esophagitis: Secondary | ICD-10-CM | POA: Diagnosis present

## 2015-02-26 DIAGNOSIS — F419 Anxiety disorder, unspecified: Secondary | ICD-10-CM | POA: Diagnosis present

## 2015-02-26 DIAGNOSIS — Z23 Encounter for immunization: Secondary | ICD-10-CM | POA: Diagnosis not present

## 2015-02-26 DIAGNOSIS — F1110 Opioid abuse, uncomplicated: Secondary | ICD-10-CM | POA: Insufficient documentation

## 2015-02-26 DIAGNOSIS — F32A Depression, unspecified: Secondary | ICD-10-CM

## 2015-02-26 DIAGNOSIS — F4310 Post-traumatic stress disorder, unspecified: Secondary | ICD-10-CM | POA: Diagnosis present

## 2015-02-26 DIAGNOSIS — F3130 Bipolar disorder, current episode depressed, mild or moderate severity, unspecified: Secondary | ICD-10-CM | POA: Diagnosis not present

## 2015-02-26 DIAGNOSIS — R45851 Suicidal ideations: Secondary | ICD-10-CM

## 2015-02-26 DIAGNOSIS — R51 Headache: Secondary | ICD-10-CM | POA: Diagnosis not present

## 2015-02-26 DIAGNOSIS — Z62810 Personal history of physical and sexual abuse in childhood: Secondary | ICD-10-CM | POA: Diagnosis present

## 2015-02-26 DIAGNOSIS — G4700 Insomnia, unspecified: Secondary | ICD-10-CM | POA: Diagnosis present

## 2015-02-26 LAB — COMPREHENSIVE METABOLIC PANEL
ALK PHOS: 57 U/L (ref 38–126)
ALT: 10 U/L — ABNORMAL LOW (ref 14–54)
ANION GAP: 9 (ref 5–15)
AST: 20 U/L (ref 15–41)
Albumin: 4.2 g/dL (ref 3.5–5.0)
BILIRUBIN TOTAL: 0.6 mg/dL (ref 0.3–1.2)
BUN: 20 mg/dL (ref 6–20)
CALCIUM: 8.8 mg/dL — AB (ref 8.9–10.3)
CO2: 23 mmol/L (ref 22–32)
Chloride: 107 mmol/L (ref 101–111)
Creatinine, Ser: 0.92 mg/dL (ref 0.44–1.00)
GFR calc non Af Amer: >60 mL/min (ref >60)
Glucose, Bld: 85 mg/dL (ref 65–99)
Potassium: 3.9 mmol/L (ref 3.5–5.1)
SODIUM: 139 mmol/L (ref 135–145)
TOTAL PROTEIN: 7.3 g/dL (ref 6.5–8.1)

## 2015-02-26 LAB — RAPID URINE DRUG SCREEN, HOSP PERFORMED
Amphetamines: NOT DETECTED
BARBITURATES: NOT DETECTED
Benzodiazepines: POSITIVE — AB
COCAINE: NOT DETECTED
OPIATES: POSITIVE — AB
TETRAHYDROCANNABINOL: NOT DETECTED

## 2015-02-26 LAB — CBC
HCT: 41.9 % (ref 36.0–46.0)
Hemoglobin: 13.7 g/dL (ref 12.0–15.0)
MCH: 29.8 pg (ref 26.0–34.0)
MCHC: 32.7 g/dL (ref 30.0–36.0)
MCV: 91.3 fL (ref 78.0–100.0)
Platelets: 323 10*3/uL (ref 150–400)
RBC: 4.59 MIL/uL (ref 3.87–5.11)
RDW: 13 % (ref 11.5–15.5)
WBC: 5.1 10*3/uL (ref 4.0–10.5)

## 2015-02-26 LAB — ETHANOL: Alcohol, Ethyl (B): <5 mg/dL (ref <5)

## 2015-02-26 LAB — SALICYLATE LEVEL

## 2015-02-26 LAB — ACETAMINOPHEN LEVEL

## 2015-02-26 MED ORDER — LINACLOTIDE 145 MCG PO CAPS
145.0000 ug | ORAL_CAPSULE | Freq: Every day | ORAL | Status: DC | PRN
  Filled 2015-02-26: qty 1

## 2015-02-26 MED ORDER — VITAMIN D (ERGOCALCIFEROL) 1.25 MG (50000 UNIT) PO CAPS
50000.0000 [IU] | ORAL_CAPSULE | ORAL | Status: DC
  Administered 2015-02-27: 50000 [IU] via ORAL
  Filled 2015-02-26 (×2): qty 1

## 2015-02-26 MED ORDER — PANTOPRAZOLE SODIUM 40 MG PO TBEC
40.0000 mg | DELAYED_RELEASE_TABLET | Freq: Every day | ORAL | Status: DC
  Administered 2015-02-26: 40 mg via ORAL
  Filled 2015-02-26: qty 1

## 2015-02-26 MED ORDER — VENLAFAXINE HCL 75 MG PO TABS
75.0000 mg | ORAL_TABLET | Freq: Three times a day (TID) | ORAL | Status: DC
  Filled 2015-02-26: qty 1

## 2015-02-26 MED ORDER — SUMATRIPTAN SUCCINATE 25 MG PO TABS
25.0000 mg | ORAL_TABLET | ORAL | Status: DC | PRN
  Filled 2015-02-26: qty 1

## 2015-02-26 MED ORDER — LORAZEPAM 1 MG PO TABS: 1.0000 mg | ORAL_TABLET | Freq: Three times a day (TID) | ORAL | Status: DC | PRN

## 2015-02-26 MED ORDER — CITALOPRAM HYDROBROMIDE 40 MG PO TABS
40.0000 mg | ORAL_TABLET | Freq: Every day | ORAL | Status: DC
  Filled 2015-02-26 (×3): qty 1

## 2015-02-26 MED ORDER — ALPRAZOLAM 0.25 MG PO TABS: 0.2500 mg | ORAL_TABLET | Freq: Two times a day (BID) | ORAL | Status: DC | PRN

## 2015-02-26 MED ORDER — SUMATRIPTAN SUCCINATE 25 MG PO TABS
25.0000 mg | ORAL_TABLET | ORAL | Status: DC | PRN
  Administered 2015-02-27 – 2015-03-02 (×4): 25 mg via ORAL
  Filled 2015-02-26 (×4): qty 1

## 2015-02-26 MED ORDER — CITALOPRAM HYDROBROMIDE 40 MG PO TABS
40.0000 mg | ORAL_TABLET | Freq: Every day | ORAL | Status: DC
  Filled 2015-02-26: qty 1

## 2015-02-26 MED ORDER — LORATADINE 10 MG PO TABS
10.0000 mg | ORAL_TABLET | Freq: Every day | ORAL | Status: DC
  Filled 2015-02-26: qty 1

## 2015-02-26 MED ORDER — ACETAMINOPHEN 325 MG PO TABS: 650.0000 mg | ORAL_TABLET | ORAL | Status: DC | PRN

## 2015-02-26 MED ORDER — LEVOTHYROXINE SODIUM 100 MCG PO TABS
100.0000 ug | ORAL_TABLET | Freq: Every day | ORAL | Status: DC
  Filled 2015-02-26 (×2): qty 1

## 2015-02-26 MED ORDER — LEVOTHYROXINE SODIUM 100 MCG PO TABS
100.0000 ug | ORAL_TABLET | Freq: Every day | ORAL | Status: DC
  Administered 2015-02-27 – 2015-03-04 (×6): 100 ug via ORAL
  Filled 2015-02-26 (×10): qty 1

## 2015-02-26 MED ORDER — TRAZODONE 25 MG HALF TABLET
25.0000 mg | ORAL_TABLET | Freq: Every day | ORAL | Status: DC
  Administered 2015-02-26 – 2015-02-27 (×2): 25 mg via ORAL
  Filled 2015-02-26 (×4): qty 1

## 2015-02-26 MED ORDER — VENLAFAXINE HCL 75 MG PO TABS
75.0000 mg | ORAL_TABLET | Freq: Three times a day (TID) | ORAL | Status: DC
  Filled 2015-02-26 (×7): qty 1

## 2015-02-26 MED ORDER — ALPRAZOLAM 0.25 MG PO TABS
0.2500 mg | ORAL_TABLET | Freq: Two times a day (BID) | ORAL | Status: DC | PRN
  Administered 2015-02-26: 0.25 mg via ORAL
  Filled 2015-02-26: qty 1

## 2015-02-26 MED ORDER — ONDANSETRON HCL 4 MG PO TABS: 4.0000 mg | ORAL_TABLET | Freq: Three times a day (TID) | ORAL | Status: DC | PRN

## 2015-02-26 MED ORDER — PANTOPRAZOLE SODIUM 40 MG PO TBEC
40.0000 mg | DELAYED_RELEASE_TABLET | Freq: Every day | ORAL | Status: DC
  Administered 2015-02-28 – 2015-03-04 (×5): 40 mg via ORAL
  Filled 2015-02-26 (×9): qty 1

## 2015-02-26 MED ORDER — TRAZODONE HCL 50 MG PO TABS: 25.0000 mg | ORAL_TABLET | Freq: Every day | ORAL | Status: DC

## 2015-02-26 MED ORDER — ALUM & MAG HYDROXIDE-SIMETH 200-200-20 MG/5ML PO SUSP: 30.0000 mL | ORAL | Status: DC | PRN

## 2015-02-26 MED ORDER — VITAMIN D (ERGOCALCIFEROL) 1.25 MG (50000 UNIT) PO CAPS: 50000.0000 [IU] | ORAL_CAPSULE | ORAL | Status: DC

## 2015-02-26 MED ORDER — LORATADINE 10 MG PO TABS
10.0000 mg | ORAL_TABLET | Freq: Every day | ORAL | Status: DC
  Administered 2015-02-27 – 2015-03-04 (×6): 10 mg via ORAL
  Filled 2015-02-26 (×9): qty 1

## 2015-02-26 NOTE — ED Notes (Signed)
Husband took all of pts belongings to the car

## 2015-02-26 NOTE — Progress Notes (Signed)
Pre visit review using our clinic review tool, if applicable. No additional management support is needed unless otherwise documented below in the visit note. 

## 2015-02-26 NOTE — BH Assessment (Signed)
Patient accepted to [facility] adult unit by Reginold Agent, NP. The attending provider at [facility] is Dr. Parke Poisson. Room assignment is 407-2. Nursing report # is [phone]. Patient has completed support paperwork. Patient is voluntary. She is ready for transfer to [facility] via [place].
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
## 2015-02-26 NOTE — Assessment & Plan Note (Signed)
Deteriorated. >25 minutes spent in face to face time with patient, >50% spent in counselling or coordination of care Finally agreed to go to behavorial health to seek out immediate treatment- husband will take her to [facility] ER now. The patient indicates understanding of these issues and agrees with the plan.

## 2015-02-26 NOTE — ED Notes (Addendum)
Pt was sent over by pcp. Pt reports SI thoughts x1 year. Keeps stating "I just don't want to be alive". When asked if pt has a SI plan, pt says "there are lots of plans. I could wreck my car, I could fall through the attic where the soft stuff is". Pt denies HI, AH/VH. Denies pain. When asked if pt could contract for safety pt said, "how would I be able to hurt myself, people are always watching."  Pt reports SI attempt 1.5 weeks ago, took pills and drank ETOH. Did not come to hospital.

## 2015-02-26 NOTE — ED Notes (Signed)
Patient discharged from [facility].  Patient transported via [place] transportation to [facility] [address] hall.  Patient did not have any personal belongings.  Husband took all belongings home with him.
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
## 2015-02-26 NOTE — ED Notes (Signed)
Called [place] transportation for transport to [facility] [address].

## 2015-02-26 NOTE — Progress Notes (Signed)
Subjective:   Patient ID: Tanya Nguyen, female    DOB: 14-Jun-1965, 49 y.o.   MRN: [id number]  Kimyra Pressnall is a pleasant 49 y.o. year old female who presents to clinic today with her husband Follow-up and Depression  on 02/26/2015  HPI:  Saw my partner, Dr. Damita Dunnings, on 01/28/15 for worsening depression. Note reviewed.  Increased venlafaxine to three times daily and advised she follow up with me.  Currently taking Celexa 40 mg daily, Effexor 75 mg three times daily and Trazodone at bedtime.  Also has as needed xanax.  Tearful, "doesnt want to live anymore," but would never actually kill herself because she does not want to leave her mom alone. She feels she has been caring for everyone else her entire life.  Not sleeping well. Eating ok.  Sister is schizophrenic and bipolar. She denies symptoms of mania. Wt Readings from Last 3 Encounters:  02/26/15 128 lb (58.06 kg)  02/24/15 126 lb (57.153 kg)  01/28/15 126 lb (57.153 kg)   Current Outpatient Prescriptions on File Prior to Visit  Medication Sig Dispense Refill  . ALPRAZolam (XANAX) 0.25 MG tablet TAKE ONE TABLET TWICE DAILY AS NEEDED FOR ANXIETY. ONLY FOR SHORT TERM USE 60 tablet 0  . cetirizine (ZYRTEC) 10 MG tablet Take 10 mg by mouth daily.    . citalopram (CELEXA) 40 MG tablet TAKE ONE (1) TABLET EACH DAY 30 tablet 5  . HYDROcodone-homatropine (HYCODAN) 5-1.5 MG/5ML syrup Take 5 mLs by mouth every 8 (eight) hours as needed for cough. 120 mL 0  . levothyroxine (SYNTHROID, LEVOTHROID) 100 MCG tablet TAKE ONE (1) TABLET EACH DAY 90 tablet 1  . Linaclotide (LINZESS) 145 MCG CAPS capsule Take 1 capsule (145 mcg total) by mouth daily. 30 capsule 3  . omeprazole (PRILOSEC) 40 MG capsule TAKE ONE (1) CAPSULE EACH DAY 30 capsule 11  . ondansetron (ZOFRAN) 4 MG tablet TAKE ONE TABLET BY MOUTH EVERY 8 HOURS AS NEEDED FOR OR VOMITING 20 tablet 3  . SUMAtriptan (IMITREX) 25 MG tablet TAKE ONE TABLET BY MOUTH EVERY 2 HOURS AS NEEDED FOR  MIGRAINE. MAY REPEAT IN 2 HOURS IF HEADACHE PERSISTS OR RECURS 10 tablet 5  . topiramate (TOPAMAX) 50 MG tablet Take 1 tablet (50 mg total) by mouth 2 (two) times daily. 30 tablet 3  . traMADol (ULTRAM) 50 MG tablet TAKE ONE TABLET BY MOUTH EVERY 8 HOURS 30 tablet 0  . traZODone (DESYREL) 50 MG tablet Take 1 tablet (50 mg total) by mouth at bedtime. 30 tablet 6  . venlafaxine (EFFEXOR) 75 MG tablet Take 1 tablet (75 mg total) by mouth 3 (three) times daily with meals. 90 tablet 0  . Vitamin D, Ergocalciferol, (DRISDOL) 50000 UNITS CAPS capsule Take 1 capsule (50,000 Units total) by mouth every 7 (seven) days. 6 capsule 0   No current facility-administered medications on file prior to visit.    Allergies  Allergen Reactions  . Nsaids     Other reaction(s): OTHER  . Sulfa Antibiotics Nausea And Vomiting    Past Medical History  Diagnosis Date  . GERD (gastroesophageal reflux disease)   . Migraine headache   . Hypothyroidism   . Depression   . Bleeding duodenal ulcer   . Kidney stones     Past Surgical History  Procedure Laterality Date  . Abdominal hysterectomy  2004    partial  . Cholecystectomy    . Tooth extraction      Family History  Problem Relation Age of  Onset  . Cancer Mother   . Cancer Father   . Alcohol abuse Sister   . Alcohol abuse Maternal Grandmother   . Stroke Maternal Grandmother   . Stroke Paternal Grandmother     Social History   Social History  . Marital Status: Married    Spouse Name: N/A  . Number of Children: N/A  . Years of Education: N/A   Occupational History  . Not on file.   Social History Main Topics  . Smoking status: Never Smoker   . Smokeless tobacco: Never Used  . Alcohol Use: 0.0 oz/week    0 Standard drinks or equivalent per week  . Drug Use: No  . Sexual Activity: Yes   Other Topics Concern  . Not on file   Social History Narrative   The PMH, PSH, Social History, Family History, Medications, and allergies have been  reviewed in [facility], and have been updated if relevant.   Review of Systems  Constitutional: Negative.   Psychiatric/Behavioral: Positive for suicidal ideas, sleep disturbance, dysphoric mood and decreased concentration. Negative for behavioral problems, confusion, self-injury and agitation. The patient is nervous/anxious.   All other systems reviewed and are negative.      Objective:    BP 108/62 mmHg  Pulse 96  Temp(Src) 97.8 F (36.6 C) (Oral)  Wt 128 lb (58.06 kg)  SpO2 98%   Physical Exam  Constitutional: She is oriented to person, place, and time. She appears well-developed and well-nourished. No distress.  HENT:  Head: Normocephalic.  Eyes: Conjunctivae are normal.  Cardiovascular: Normal rate.   Pulmonary/Chest: Effort normal.  Neurological: She is alert and oriented to person, place, and time. No cranial nerve deficit.  Skin: Skin is warm. She is not diaphoretic.  Psychiatric:  Tearful, depressed appearing  Nursing note and vitals reviewed.         Assessment & Plan:   Depression No Follow-up on file.

## 2015-02-26 NOTE — Progress Notes (Signed)
Adult Psychoeducational Group Note  Date:  02/26/2015 Time:  10:02 PM  Group Topic/Focus:  Wrap-Up Group:   The focus of this group is to help patients review their daily goal of treatment and discuss progress on daily workbooks.  Participation Level:  Active  Participation Quality:  Appropriate  Affect:  Appropriate  Cognitive:  Alert  Insight: Appropriate  Engagement in Group:  Engaged  Modes of Intervention:  Discussion  Additional Comments:  Pt is new to the unit. Her hope is to start feeling better.   Wynelle Fanny R 02/26/2015, 10:02 PM

## 2015-02-27 ENCOUNTER — Encounter (HOSPITAL_COMMUNITY): Payer: Self-pay | Admitting: Psychiatry

## 2015-02-27 DIAGNOSIS — R45851 Suicidal ideations: Secondary | ICD-10-CM

## 2015-02-27 DIAGNOSIS — F332 Major depressive disorder, recurrent severe without psychotic features: Secondary | ICD-10-CM

## 2015-02-27 DIAGNOSIS — F4310 Post-traumatic stress disorder, unspecified: Secondary | ICD-10-CM

## 2015-02-27 MED ORDER — CLONAZEPAM 0.5 MG PO TABS
0.5000 mg | ORAL_TABLET | Freq: Two times a day (BID) | ORAL | Status: DC | PRN
  Administered 2015-02-27 – 2015-03-03 (×5): 0.5 mg via ORAL
  Filled 2015-02-27 (×6): qty 1

## 2015-02-27 MED ORDER — LITHIUM CARBONATE 300 MG PO CAPS
300.0000 mg | ORAL_CAPSULE | Freq: Two times a day (BID) | ORAL | Status: DC
  Administered 2015-02-27 – 2015-03-04 (×10): 300 mg via ORAL
  Filled 2015-02-27 (×14): qty 1

## 2015-02-27 MED ORDER — SERTRALINE HCL 25 MG PO TABS
25.0000 mg | ORAL_TABLET | Freq: Every day | ORAL | Status: DC
  Administered 2015-02-27 – 2015-03-01 (×3): 25 mg via ORAL
  Filled 2015-02-27 (×4): qty 1

## 2015-02-27 NOTE — BHH Suicide Risk Assessment (Signed)
[facility] INPATIENT:  Family/Significant Other Suicide Prevention Education  Suicide Prevention Education:  Education Completed; Husband Paysleigh Frymier [phone],  (name of family member/significant other) has been identified by the patient as the family member/significant other with whom the patient will be residing, and identified as the person(s) who will aid the patient in the event of a mental health crisis (suicidal ideations/suicide attempt).  With written consent from the patient, the family member/significant other has been provided the following suicide prevention education, prior to the and/or following the discharge of the patient.  The suicide prevention education provided includes the following:  Suicide risk factors  Suicide prevention and interventions  National Suicide Hotline telephone number  [facility] assessment telephone number  [facility] Emergency Assistance [place] and/or Residential Mobile Crisis Unit telephone number  Request made of family/significant other to:  Remove weapons (e.g., guns, rifles, knives), all items previously/currently identified as safety concern.    Remove drugs/medications (over-the-counter, prescriptions, illicit drugs), all items previously/currently identified as a safety concern.  The family member/significant other verbalizes understanding of the suicide prevention education information provided.  The family member/significant other agrees to remove the items of safety concern listed above.  Laurey Salser, Casimiro Needle 02/27/2015, 4:33 PM

## 2015-02-27 NOTE — Progress Notes (Signed)
DAR Note: Tanya Nguyen has been up and visible on the unit.  She has attended groups.  Interacting with peers.  She is very reluctant on taking medications.  "I don't want to take the celexa and effexor.  I want to take something else."  Encouraged her to talk with the doctor today to let them know how she is feeling.  She denies SI/HI or A/V hallucinations.  She completed her self inventory and reports that her depression, hopelessness and anxiety are 10/10.  She states that her goal for today is "wanting to learn how to love myself" and she will accomplish this goal by "going to groups and talking with staff."  She denies any pain or discomfort and she appears to be in no physical distress.  Encouraged participation in group and unit activities.  Q 15 minute checks maintained for safety.  We will continue to monitor the progress towards her goals.

## 2015-02-27 NOTE — BHH Group Notes (Signed)
[facility] Group Notes:  (Nursing/MHT/Case Management/Adjunct)  Date:  02/27/2015  Time:  11:27 AM  Type of Therapy:  Nurse Education  Participation Level:  Minimal  Participation Quality:  Attentive  Affect:  Blunted  Cognitive:  Appropriate  Insight:  Lacking  Engagement in Group:  Lacking  Modes of Intervention:  Discussion and Education  Summary of Progress/Problems:  Group topic today is Lifestyle and Leisure changes. Talked about coping skills. [place] was attentive but quiet during group.  She didn't interact.  Barbette Or Deana Krock 02/27/2015, 11:27 AM

## 2015-02-27 NOTE — Progress Notes (Signed)
Pt participated in a game for wrap-up group.

## 2015-02-27 NOTE — Progress Notes (Signed)
Patient ID: Tanya Nguyen, female   DOB: 1965-08-22, 49 y.o.   MRN: [id number]  Admission Note:  49 yr female who presents [facility] in no acute distress for the treatment of SI and Depression. Pt appears flat and depressed. Pt was calm and cooperative with admission process. Pt presents with passive SI and contracts for safety upon admission. Pt denies AVH . Pt endorsing increasing depression x 1 yr. " I don't think I need to be on this earth, don't thing depression medicine working, do you think I'm bi-polar". Pt stated she has been taking care of her mother x 5 yrs. Pt believes that her medications are not working and she doesn't want to take the Effexor . Pt stated she left [facility] inpatient  x 1 yr.    A: Skin was assessed by prior shift. PT searched by prior shift and no contraband reported   R:Pt had no additional questions or concerns.

## 2015-02-27 NOTE — BHH Group Notes (Signed)
[facility] LCSW Group Therapy 02/27/2015 1:15pm  Type of Therapy: Group Therapy- Balance in Life  Participation Level: Minimal  Description of the Group:  The topic for group was balance in life. Today's group focused on defining balance in one's own words, identifying things that can knock one off balance, and exploring healthy ways to maintain balance in life. Group members were asked to provide an example of a time when they felt off balance, describe how they handled that situation,and process healthier ways to regain balance in the future. Group members were asked to share the most important tool for maintaining balance that they learned while at [facility] and how they plan to apply this method after discharge.  Summary of Patient Progress Pt did not participate in group discussion; observed to be withdrawn but attentive.   Therapeutic Modalities:   Cognitive Behavioral Therapy Solution-Focused Therapy Assertiveness Training   Peri Maris, Latanya Presser 02/27/2015 6:11 PM\

## 2015-02-27 NOTE — BHH Suicide Risk Assessment (Signed)
[facility] Admission Suicide Risk Assessment   Nursing information obtained from:    Demographic factors:    Current Mental Status:    Loss Factors:    Historical Factors:    Risk Reduction Factors:    Total Time spent with patient: 30 minutes Principal Problem: MDD (major depressive disorder), recurrent episode, severe ([place]) Diagnosis:   Patient Active Problem List   Diagnosis Date Noted  . PTSD (post-traumatic stress disorder) [F43.10] 02/27/2015  . MDD (major depressive disorder), recurrent episode, severe ([place]) [F33.2] 02/26/2015  . Abnormal mammogram [R92.8] 05/27/2014  . Overuse of medication [Z91.14] 05/09/2014  . Constipation [K59.00] 04/02/2014  . Fatigue [R53.83] 12/13/2013  . Insomnia [G47.00] 08/30/2013  . GERD (gastroesophageal reflux disease) [K21.9]   . Migraine headache [G43.909]   . Hypothyroidism [E03.9]   . Bleeding duodenal ulcer [K26.4]      Continued Clinical Symptoms:  Alcohol Use Disorder Identification Test Final Score (AUDIT): 1 The "Alcohol Use Disorders Identification Test", Guidelines for Use in Primary Care, Second Edition.  World Pharmacologist [facility]). Score between 0-7:  no or low risk or alcohol related problems. Score between 8-15:  moderate risk of alcohol related problems. Score between 16-19:  high risk of alcohol related problems. Score 20 or above:  warrants further diagnostic evaluation for alcohol dependence and treatment.   CLINICAL FACTORS:   Depression:   Anhedonia Hopelessness Impulsivity Severe Previous Psychiatric Diagnoses and Treatments   Musculoskeletal: Strength & Muscle Tone: within normal limits Gait & Station: normal Patient leans: N/A  Psychiatric Specialty Exam: Physical Exam  Review of Systems  Psychiatric/Behavioral: Positive for depression and suicidal ideas. The patient is nervous/anxious and has insomnia.   All other systems reviewed and are negative.   Blood pressure 100/77, pulse 102, temperature 97.8  F (36.6 C), temperature source Oral, resp. rate 18, height 5' (1.524 m), weight 56.7 kg (125 lb), SpO2 100 %.Body mass index is 24.41 kg/(m^2).  General Appearance: Fairly Groomed  Engineer, water::  Good  Speech:  Clear and Coherent  Volume:  Normal  Mood:  Anxious and Depressed  Affect:  Appropriate  Thought Process:  Logical  Orientation:  Full (Time, Place, and Person)  Thought Content:  Rumination  Suicidal Thoughts:  Yes.  without intent/plan  Homicidal Thoughts:  No  Memory:  Immediate;   Fair Recent;   Fair Remote;   Fair  Judgement:  Fair  Insight:  Fair  Psychomotor Activity:  Normal  Concentration:  Fair  Recall:  [employer] [facility] [place]  Language: Fair  Akathisia:  No  Handed:  Right  AIMS (if indicated):     Assets:  Desire for Improvement  Sleep:     Cognition: WNL  ADL's:  Intact     COGNITIVE FEATURES THAT CONTRIBUTE TO RISK:  Closed-mindedness, Polarized thinking and Thought constriction (tunnel vision)    SUICIDE RISK:   Moderate:  Frequent suicidal ideation with limited intensity, and duration, some specificity in terms of plans, no associated intent, good self-control, limited dysphoria/symptomatology, some risk factors present, and identifiable protective factors, including available and accessible social support.  PLAN OF CARE: Patient will benefit from inpatient treatment and stabilization.  Estimated length of stay is 5-7 days.  Reviewed past medical records,treatment plan.  Will add Zoloft 25 mg po daily for affective sx. Will discontinue Celexa for lack of efficacy and she had been on it for 8 years. Will discontinue Effexor for side effects - pt reports increased crying spells and reports she does  not want to be on it anymore. Pt reports she did not take it since the past two days since she felt worse. Will add Lithium 300 mg po tonight and increase to 300 mg po bid tomorrow for augmenting the effect of Zoloft as well as for chronic  suicidality.. Will change Xanax to Klonopin 0.5 mg po bid prn for breakthrough anxiety sx. Will continue Trazodone 25 mg po qhs for sleep. Will continue to monitor vitals ,medication compliance and treatment side effects while patient is here.  Will monitor for medical issues as well as call consult as needed.  Reviewed labs cbc- wnl, cmp - wnl, uds- opioids and BZD- pt reports she is on xanax and pain medications prescribed by her PCP. CSW will start working on disposition. Patient to be referred for Trauma focussed therapy/CBT . Patient to participate in therapeutic milieu .       Medical Decision Making:  Review of Psycho-Social Stressors (1), Decision to obtain old records (1), Review and summation of old records (2), New Problem, with no additional work-up planned (3), Review or order medicine tests (1) and Review of New Medication or Change in Dosage (2)  I certify that inpatient services furnished can reasonably be expected to improve the patient's condition.   Graycie Halley MD 02/27/2015, 3:15 PM
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
## 2015-02-27 NOTE — Tx Team (Signed)
Initial Interdisciplinary Treatment Plan   PATIENT STRESSORS: Marital or family conflict Medication change or noncompliance   PATIENT STRENGTHS: Ability for insight General fund of knowledge Motivation for treatment/growth Supportive family/friends   PROBLEM LIST: Problem List/Patient Goals Date to be addressed Date deferred Reason deferred Estimated date of resolution  depression 12/22     Risk for suicide 12/22     "meds changed and learn ways to make Pam happy" 12/22                                          DISCHARGE CRITERIA:  Improved stabilization in mood, thinking, and/or behavior Verbal commitment to aftercare and medication compliance  PRELIMINARY DISCHARGE PLAN: Attend aftercare/continuing care group Outpatient therapy  PATIENT/FAMIILY INVOLVEMENT: This treatment plan has been presented to and reviewed with the patient, Tanya Nguyen.  The patient and family have been given the opportunity to ask questions and make suggestions.  Vonzella Nipple A 02/27/2015, 2:24 AM

## 2015-02-27 NOTE — Tx Team (Signed)
Interdisciplinary Treatment Plan Update (Adult) Date: 02/27/2015   Date: 02/27/2015 12:04 PM  Progress in Treatment:  Attending groups: Pt is new to milieu, continuing to assess  Participating in groups: Pt is new to milieu, continuing to assess  Taking medication as prescribed: Yes  Tolerating medication: Yes  Family/Significant othe contact made: No, CSW attempting to make contact with husband Patient understands diagnosis: Yes Discussing patient identified problems/goals with staff: Yes  Medical problems stabilized or resolved: Yes  Denies suicidal/homicidal ideation: No,Pt recently admitted with SI Patient has not harmed self or Others: Yes   New problem(s) identified: None identified at this time.   Discharge Plan or Barriers: Pt will return home and follow-up with her PCP; declines therapy referral.  Additional comments:  Patient and CSW reviewed pt's identified goals and treatment plan. Patient verbalized understanding and agreed to treatment plan. CSW reviewed [facility] "Discharge Process and Patient Involvement" Form. Pt verbalized understanding of information provided and signed form.   Reason for Continuation of Hospitalization:  Anxiety Depression Medication stabilization Suicidal ideation  Estimated length of stay: 3-5 days  Review of initial/current patient goals per problem list:   1.  Goal(s): Patient will participate in aftercare plan  Met:  Yes  Target date: 3-5 days from date of admission   As evidenced by: Patient will participate within aftercare plan AEB aftercare provider and housing plan at discharge being identified.   02/27/15: Pt will follow-up with her PCP; return home with husband.  2.  Goal (s): Patient will exhibit decreased depressive symptoms and suicidal ideations.  Met:  No  Target date: 3-5 days from date of admission   As evidenced by: Patient will utilize self rating of depression at 3 or below and demonstrate decreased signs of  depression or be deemed stable for discharge by MD. 02/27/15: Pt was admitted with symptoms of depression, rating 10/10. Pt continues to present with flat affect and depressive symptoms.  Pt will demonstrate decreased symptoms of depression and rate depression at 3/10 or lower prior to discharge.  3.  Goal(s): Patient will demonstrate decreased signs and symptoms of anxiety.  Met:  No  Target date: 3-5 days from date of admission   As evidenced by: Patient will utilize self rating of anxiety at 3 or below and demonstrated decreased signs of anxiety, or be deemed stable for discharge by MD 02/27/15: Pt was admitted with increased levels of anxiety and is currently rating those symptoms highly. Pt will demonstrated decreased symptoms of anxiety and rate it at 3/10 prior to d/c.  Attendees:  Patient:    Family:    Physician: Dr. Shea Evans  02/27/2015 12:04 PM  Nursing: Lars Pinks, RN Case manager  02/27/2015 12:04 PM  Clinical Social Worker Peri Maris, [place] 02/27/2015 12:04 PM  Other: Tilden Fossa, [place] 02/27/2015 12:04 PM  Clinical: Manuella Ghazi RN 02/27/2015 12:04 PM  Other: , RN Charge Nurse 02/27/2015 12:04 PM  Other:     Peri Maris, [place] Social Work [phone]
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
## 2015-02-28 MED ORDER — TRAZODONE HCL 50 MG PO TABS
50.0000 mg | ORAL_TABLET | Freq: Every day | ORAL | Status: DC
  Administered 2015-02-28: 50 mg via ORAL
  Filled 2015-02-28 (×3): qty 1

## 2015-02-28 NOTE — Progress Notes (Signed)
Adult Psychoeducational Group Note  Date:  02/28/2015 Time:  2015  Group Topic/Focus:  Wrap-Up Group:   The focus of this group is to help patients review their daily goal of treatment and discuss progress on daily workbooks.  Participation Level:  Active  Participation Quality:  Appropriate  Affect:  Appropriate  Cognitive:  Appropriate  Insight: Appropriate  Engagement in Group:  Engaged  Modes of Intervention:  Discussion  Additional Comments:  Pt was active during wrap up group. Pt stated that she was happy she was at [facility] to get help and that she was thankful for [place] helping her. Pt stated that she is shy and doesn't like to talk in groups. Pt stated that she is that care giver for her mother and being here is hard because her husband has to take care of her mother too.   Yemaya Barnier Chanel 02/28/2015, 11:50 PM

## 2015-02-28 NOTE — BHH Group Notes (Signed)
[facility] LCSW Aftercare Discharge Planning Group Note  02/28/2015 8:45 AM  Pt did not attend, declined invitation.   Peri Maris, [place] 02/28/2015 10:07 AM

## 2015-02-28 NOTE — Progress Notes (Signed)
[facility] MD Progress Note  02/28/2015 4:27 PM Fredie Gudmundson  MRN:  [id number] Subjective:  "I feel pretty good today. My medications are really helping. My sleep isn't great, but that's the only issue right now."  Objective: Pt seen and chart reviewed. Pt is alert/oriented x4, calm, cooperative, and appropriate to situation. Pt denies suicidal/homicidal ideation and psychosis and does not appear to be responding to internal stimuli. Pt reports that she has noticed improvement based on participating in group therapy thus far. Pt notes a decrease in both anxiety and depression. Good appetite, yet poor sleep.   Principal Problem: MDD (major depressive disorder), recurrent severe, without psychosis ([place]) Diagnosis:   Patient Active Problem List   Diagnosis Date Noted  . MDD (major depressive disorder), recurrent severe, without psychosis ([place]) [F33.2] 02/27/2015    Priority: High  . PTSD (post-traumatic stress disorder) [F43.10] 02/27/2015    Priority: Medium  . Abnormal mammogram [R92.8] 05/27/2014  . Overuse of medication [Z91.14] 05/09/2014  . Constipation [K59.00] 04/02/2014  . Fatigue [R53.83] 12/13/2013  . Insomnia [G47.00] 08/30/2013  . GERD (gastroesophageal reflux disease) [K21.9]   . Migraine headache [G43.909]   . Hypothyroidism [E03.9]   . Bleeding duodenal ulcer [K26.4]    Total Time spent with patient: 15 minutes  Past Psychiatric History: See H&P  Past Medical History:  Past Medical History  Diagnosis Date  . GERD (gastroesophageal reflux disease)   . Migraine headache   . Hypothyroidism   . Depression   . Bleeding duodenal ulcer   . Kidney stones     Past Surgical History  Procedure Laterality Date  . Abdominal hysterectomy  2004    partial  . Cholecystectomy    . Tooth extraction     Family History:  Family History  Problem Relation Age of Onset  . Cancer Mother   . Cancer Father   . Alcohol abuse Sister   . Bipolar disorder Sister   . Alcohol abuse  Maternal Grandmother   . Stroke Maternal Grandmother   . Stroke Paternal Grandmother    Family Psychiatric  History: See H&P Social History:  History  Alcohol Use  . 0.0 oz/week  . 0 Standard drinks or equivalent per week     History  Drug Use No    Social History   Social History  . Marital Status: Married    Spouse Name: N/A  . Number of Children: N/A  . Years of Education: N/A   Social History Main Topics  . Smoking status: Never Smoker   . Smokeless tobacco: Never Used  . Alcohol Use: 0.0 oz/week    0 Standard drinks or equivalent per week  . Drug Use: No  . Sexual Activity: Yes    Birth Control/ Protection: None   Other Topics Concern  . None   Social History Narrative   Additional Social History:                         Sleep: Poor  Appetite:  Good  Current Medications: Current Facility-Administered Medications  Medication Dose Route Frequency Provider Last Rate Last Dose  . clonazePAM (KLONOPIN) tablet 0.5 mg  0.5 mg Oral BID PRN Ursula Alert, MD   0.5 mg at 02/27/15 2155  . levothyroxine (SYNTHROID, LEVOTHROID) tablet 100 mcg  100 mcg Oral QAC breakfast Delfin Gant, NP   100 mcg at 02/28/15 [id number]  . Linaclotide (LINZESS) capsule 145 mcg  145 mcg Oral Daily PRN Wynona Meals  [place], NP      . lithium carbonate capsule 300 mg  300 mg Oral BID WC Ursula Alert, MD   300 mg at 02/28/15 0831  . loratadine (CLARITIN) tablet 10 mg  10 mg Oral Daily Delfin Gant, NP   10 mg at 02/28/15 0831  . pantoprazole (PROTONIX) EC tablet 40 mg  40 mg Oral Daily Delfin Gant, NP   40 mg at 02/28/15 [id number]  . sertraline (ZOLOFT) tablet 25 mg  25 mg Oral Daily Ursula Alert, MD   25 mg at 02/28/15 0831  . SUMAtriptan (IMITREX) tablet 25 mg  25 mg Oral Q2H PRN Delfin Gant, NP   25 mg at 02/28/15 0953  . traZODone (DESYREL) tablet 25 mg  25 mg Oral QHS Delfin Gant, NP   25 mg at 02/27/15 2105  . Vitamin D (Ergocalciferol) (DRISDOL)  capsule 50,000 Units  50,000 Units Oral Q7 days Delfin Gant, NP   50,000 Units at 02/27/15 1204    Lab Results: No results found for this or any previous visit (from the past 48 hour(s)).  Physical Findings: AIMS: Facial and Oral Movements Muscles of Facial Expression: None, normal Lips and Perioral Area: None, normal Jaw: None, normal Tongue: None, normal,Extremity Movements Upper (arms, wrists, hands, fingers): None, normal Lower (legs, knees, ankles, toes): None, normal, Trunk Movements Neck, shoulders, hips: None, normal, Overall Severity Severity of abnormal movements (highest score from questions above): None, normal Incapacitation due to abnormal movements: None, normal Patient's awareness of abnormal movements (rate only patient's report): No Awareness, Dental Status Current problems with teeth and/or dentures?: No Does patient usually wear dentures?: No  CIWA:    COWS:     Musculoskeletal: Strength & Muscle Tone: within normal limits Gait & Station: normal Patient leans: N/A  Psychiatric Specialty Exam: Review of Systems  Psychiatric/Behavioral: Positive for depression and substance abuse. Negative for suicidal ideas and hallucinations. The patient is nervous/anxious and has insomnia.   All other systems reviewed and are negative.   Blood pressure 125/74, pulse 91, temperature 97.6 F (36.4 C), temperature source Oral, resp. rate 16, height 5' (1.524 m), weight 56.7 kg (125 lb), SpO2 100 %.Body mass index is 24.41 kg/(m^2).  General Appearance: Casual and Fairly Groomed  Eye Contact::  Good  Speech:  Clear and Coherent and Normal Rate  Volume:  Normal  Mood:  Anxious and Depressed  Affect:  Appropriate, Congruent, Depressed and anxious  Thought Process:  Coherent, Goal Directed and Logical  Orientation:  Full (Time, Place, and Person)  Thought Content:  WDL  Suicidal Thoughts:  No  Homicidal Thoughts:  No  Memory:  Immediate;   Fair Recent;    Fair Remote;   Fair  Judgement:  Fair  Insight:  Fair  Psychomotor Activity:  Normal  Concentration:  Fair  Recall:  [employer] of Knowledge:Fair  Language: Fair  Akathisia:  No  Handed:    AIMS (if indicated):     Assets:  Communication Skills Desire for Improvement Resilience Social Support  ADL's:  Intact  Cognition: WNL  Sleep:  Number of Hours: 6   Treatment Plan Summary: Daily contact with patient to assess and evaluate symptoms and progress in treatment and Medication management  PLAN OF CARE:  -Patient will benefit from inpatient treatment and stabilization.  -Estimated length of stay is 5-7 days.  -Reviewed past medical records,treatment plan.  -Zoloft 25 mg po daily for affective sx. -Lithium 300 mg po tonight and  increase to 300 mg po bid today -Klonopin 0.5 mg po bid prn for breakthrough anxiety sx. -Increase Trazodone 50mg  po qhs for sleep. -Continue to monitor vitals ,medication compliance and treatment side effects while patient is here.  -Will monitor for medical issues as well as call consult as needed.  -CSW will start working on disposition. Patient to be referred for Trauma focussed therapy/CBT . Patient to participate in therapeutic milieu .   Benjamine Mola, FNP-BC 02/28/2015, 1:45PM

## 2015-02-28 NOTE — BHH Group Notes (Signed)
[facility] LCSW Group Therapy 02/28/2015 1:15pm  Type of Therapy: Group Therapy- Feelings Around Relapse and Recovery  Participation Level: Minimal  Participation Quality:  Attentive  Affect:  Flat; Withdrawn  Cognitive: Alert and Oriented   Insight:  Developing   Engagement in Therapy: Minimal   Modes of Intervention: Clarification, Confrontation, Discussion, Education, Exploration, Limit-setting, Orientation, Problem-solving, Rapport Building, Art therapist, Socialization and Support  Summary of Progress/Problems: The topic for today was feelings about relapse. The group discussed what relapse prevention is to them and identified triggers that they are on the path to relapse. Members also processed their feeling towards relapse and were able to relate to common experiences. Group also discussed coping skills that can be used for relapse prevention.  Pt did not participate in group discussion but appeared to be attentive to conversation.   Therapeutic Modalities:   Cognitive Behavioral Therapy Solution-Focused Therapy Assertiveness Training Relapse Prevention Therapy    Norman Clay [phone] 02/28/2015 2:29 PM

## 2015-02-28 NOTE — Progress Notes (Signed)
D: Patient observed on unit interacting with peers and staff. Patient is attending group. Patient educated on medication.  A: Encouragement and support offered. Medications administered as ordered. Q 15 minute checks maintained for safety.  R: Patient remains safe on unit. Monitoring continues.
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
## 2015-03-01 DIAGNOSIS — F3130 Bipolar disorder, current episode depressed, mild or moderate severity, unspecified: Secondary | ICD-10-CM | POA: Diagnosis present

## 2015-03-01 MED ORDER — SERTRALINE HCL 50 MG PO TABS
50.0000 mg | ORAL_TABLET | Freq: Every day | ORAL | Status: DC
  Administered 2015-03-02 – 2015-03-04 (×3): 50 mg via ORAL
  Filled 2015-03-01 (×6): qty 1

## 2015-03-01 MED ORDER — TRAZODONE HCL 100 MG PO TABS
100.0000 mg | ORAL_TABLET | Freq: Every day | ORAL | Status: DC
  Administered 2015-03-01: 100 mg via ORAL
  Filled 2015-03-01 (×2): qty 1

## 2015-03-01 NOTE — Progress Notes (Signed)
D: Pt is alert and oriented x4. Pt complained of mild depression of 3 on a 0-10 depression scale. Pt is isolative and withdrawn to room. She states, "I can't just stand all that noise and cursing that goes on in that dayroom; I leave at home with just my husband and my mom." Pt denies anxiety, SI/HI, pain and AVH. Pt is was very flat; however, remained calm and cooperative through the shift assessment. A: Medications offered as prescribed.  Support, encouragement, and safe environment provided.  15-minute safety checks continue. R: Pt was med compliant.  Pt attended group. Safety checks continue.

## 2015-03-01 NOTE — Progress Notes (Signed)
Nursing DAR Note:  Patient denies any SI/HI/AVH, is very adamant that "I need to get out of this place; when am i going to be discharged? Patient also c/o'g about "my nerves" and "i feel one of my migraines coming on". Nurse encouraging patient to share all of her concerns with the NP or Physician who will be talking to her today. Nurse administering patient's scheduled meds and also Imitrex for her headache and ensuring q15 minute checks are done continuously and documented. Patient stating "I don't want to see a Nurse Practioner, I want to see a Doctor"! Patient remains safe on Unit.
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
## 2015-03-01 NOTE — Progress Notes (Signed)
[facility] MD Progress Note  03/01/2015  Tanya Nguyen  MRN:  [id number] Subjective:  "I feel awful today. I have cried all day. I just don't want to disappoint my husband. Also, I feel like everyone is cursing too much."  Objective: Pt seen and chart reviewed. Pt is alert/oriented x4, calm, cooperative, and appropriate to situation. Pt denies suicidal/homicidal ideation and psychosis and does not appear to be responding to internal stimuli. Pt reports that she is upset about profanity on the unit. She expresses concerns about disappointing her husband and reports that she has been spending time in her room, not attending groups, and crying a lot. Pt also talks about many occasions when she was awake 4-5 days at a time, cleaning and organizing the house excessively, pointing to mania and shifting the diagnosis toward bipolar 1.   Principal Problem: Bipolar I disorder, most recent episode depressed ([place]) Diagnosis:   Patient Active Problem List   Diagnosis Date Noted  . Bipolar I disorder, most recent episode depressed ([place]) [F31.30] 03/01/2015    Priority: High  . PTSD (post-traumatic stress disorder) [F43.10] 02/27/2015    Priority: Medium  . Abnormal mammogram [R92.8] 05/27/2014  . Overuse of medication [Z91.14] 05/09/2014  . Constipation [K59.00] 04/02/2014  . Fatigue [R53.83] 12/13/2013  . Insomnia [G47.00] 08/30/2013  . GERD (gastroesophageal reflux disease) [K21.9]   . Migraine headache [G43.909]   . Hypothyroidism [E03.9]   . Bleeding duodenal ulcer [K26.4]    Total Time spent with patient: 15 minutes  Past Psychiatric History: See H&P  Past Medical History:  Past Medical History  Diagnosis Date  . GERD (gastroesophageal reflux disease)   . Migraine headache   . Hypothyroidism   . Depression   . Bleeding duodenal ulcer   . Kidney stones     Past Surgical History  Procedure Laterality Date  . Abdominal hysterectomy  2004    partial  . Cholecystectomy    . Tooth extraction      Family History:  Family History  Problem Relation Age of Onset  . Cancer Mother   . Cancer Father   . Alcohol abuse Sister   . Bipolar disorder Sister   . Alcohol abuse Maternal Grandmother   . Stroke Maternal Grandmother   . Stroke Paternal Grandmother    Family Psychiatric  History: See H&P Social History:  History  Alcohol Use  . 0.0 oz/week  . 0 Standard drinks or equivalent per week     History  Drug Use No    Social History   Social History  . Marital Status: Married    Spouse Name: N/A  . Number of Children: N/A  . Years of Education: N/A   Social History Main Topics  . Smoking status: Never Smoker   . Smokeless tobacco: Never Used  . Alcohol Use: 0.0 oz/week    0 Standard drinks or equivalent per week  . Drug Use: No  . Sexual Activity: Yes    Birth Control/ Protection: None   Other Topics Concern  . None   Social History Narrative   Additional Social History:                         Sleep: Poor  Appetite:  Good  Current Medications: Current Facility-Administered Medications  Medication Dose Route Frequency Provider Last Rate Last Dose  . clonazePAM (KLONOPIN) tablet 0.5 mg  0.5 mg Oral BID PRN Ursula Alert, MD   0.5 mg at 02/28/15 2127  .  levothyroxine (SYNTHROID, LEVOTHROID) tablet 100 mcg  100 mcg Oral QAC breakfast Delfin Gant, NP   100 mcg at 03/01/15 0640  . Linaclotide (LINZESS) capsule 145 mcg  145 mcg Oral Daily PRN Delfin Gant, NP      . lithium carbonate capsule 300 mg  300 mg Oral BID WC Ursula Alert, MD   300 mg at 03/01/15 0827  . loratadine (CLARITIN) tablet 10 mg  10 mg Oral Daily Delfin Gant, NP   10 mg at 03/01/15 0827  . pantoprazole (PROTONIX) EC tablet 40 mg  40 mg Oral Daily Delfin Gant, NP   40 mg at 03/01/15 [id number]  . sertraline (ZOLOFT) tablet 25 mg  25 mg Oral Daily Ursula Alert, MD   25 mg at 03/01/15 0827  . SUMAtriptan (IMITREX) tablet 25 mg  25 mg Oral Q2H PRN Delfin Gant, NP   25 mg at 03/01/15 [id number]  . traZODone (DESYREL) tablet 50 mg  50 mg Oral QHS Benjamine Mola, FNP   50 mg at 02/28/15 2123  . Vitamin D (Ergocalciferol) (DRISDOL) capsule 50,000 Units  50,000 Units Oral Q7 days Delfin Gant, NP   50,000 Units at 02/27/15 1204    Lab Results: No results found for this or any previous visit (from the past 48 hour(s)).  Physical Findings: AIMS: Facial and Oral Movements Muscles of Facial Expression: None, normal Lips and Perioral Area: None, normal Jaw: None, normal Tongue: None, normal,Extremity Movements Upper (arms, wrists, hands, fingers): None, normal Lower (legs, knees, ankles, toes): None, normal, Trunk Movements Neck, shoulders, hips: None, normal, Overall Severity Severity of abnormal movements (highest score from questions above): None, normal Incapacitation due to abnormal movements: None, normal Patient's awareness of abnormal movements (rate only patient's report): No Awareness, Dental Status Current problems with teeth and/or dentures?: No Does patient usually wear dentures?: No  CIWA:    COWS:     Musculoskeletal: Strength & Muscle Tone: within normal limits Gait & Station: normal Patient leans: N/A  Psychiatric Specialty Exam: Review of Systems  Psychiatric/Behavioral: Positive for depression and substance abuse. Negative for suicidal ideas and hallucinations. The patient is nervous/anxious and has insomnia.   All other systems reviewed and are negative.   Blood pressure 99/83, pulse 94, temperature 97.9 F (36.6 C), temperature source Oral, resp. rate 16, height 5' (1.524 m), weight 56.7 kg (125 lb), SpO2 100 %.Body mass index is 24.41 kg/(m^2).  General Appearance: Casual and Fairly Groomed  Eye Contact::  Good  Speech:  Clear and Coherent and Normal Rate  Volume:  Normal  Mood:  Anxious and Depressed with worsening crying spells  Affect:  Appropriate, Congruent, Depressed and anxious and tearful  Thought  Process:  Coherent, Goal Directed and Logical  Orientation:  Full (Time, Place, and Person)  Thought Content:  WDL  Suicidal Thoughts:  No  Homicidal Thoughts:  No  Memory:  Immediate;   Fair Recent;   Fair Remote;   Fair  Judgement:  Fair  Insight:  Fair  Psychomotor Activity:  Normal  Concentration:  Fair  Recall:  [employer] of Knowledge:Fair  Language: Fair  Akathisia:  No  Handed:    AIMS (if indicated):     Assets:  Communication Skills Desire for Improvement Resilience Social Support  ADL's:  Intact  Cognition: WNL  Sleep:  Number of Hours: 7   Treatment Plan Summary: Daily contact with patient to assess and evaluate symptoms and progress in treatment and  Medication management  PLAN OF CARE:  -Patient will benefit from inpatient treatment and stabilization.  -Estimated length of stay is 5-7 days.  -Reviewed past medical records,treatment plan.  -Increase Zoloft to 50mg  po daily for affective sx. -Lithium 300 mg po tonight and increase to 300 mg po bid today -Klonopin 0.5 mg po bid prn for breakthrough anxiety sx. -Increase Trazodone to 100mg  po qhs for sleep. -Continue to monitor vitals ,medication compliance and treatment side effects while patient is here.  -Will monitor for medical issues as well as call consult as needed.  -CSW will start working on disposition. Patient to be referred for Trauma focussed therapy/CBT . Patient to participate in therapeutic milieu .   Benjamine Mola, FNP-BC 03/01/2015, 11:57 AM

## 2015-03-01 NOTE — [facility] Group Notes (Signed)
[place] Group Notes: (Clinical Social Work)   03/01/2015      Type of Therapy:  Group Therapy   Participation Level:  Did Not Attend despite MHT prompting   Selmer Dominion, LCSW 03/01/2015, 12:34 PM

## 2015-03-02 MED ORDER — TRAZODONE HCL 150 MG PO TABS
150.0000 mg | ORAL_TABLET | Freq: Every day | ORAL | Status: DC
  Administered 2015-03-02 – 2015-03-03 (×2): 150 mg via ORAL
  Filled 2015-03-02 (×5): qty 1

## 2015-03-02 MED ORDER — ACETAMINOPHEN 325 MG PO TABS: 650.0000 mg | ORAL_TABLET | Freq: Four times a day (QID) | ORAL | Status: DC | PRN

## 2015-03-02 NOTE — Progress Notes (Signed)
[facility] MD Progress Note  03/02/2015  Tanya Nguyen  MRN:  [id number] Subjective:  "I feel pretty bad today. I feel like I have been crying all day."  Objective: Pt seen and chart reviewed. Pt is alert/oriented x4, calm, cooperative, and appropriate to situation. Pt denies suicidal/homicidal ideation and psychosis and does not appear to be responding to internal stimuli. Pt reports that she feels much worse today in terms of tearfulness and depression and that she slept terribly. Pt reports a severe headache in addition to crying and sleeping all day.   Principal Problem: Bipolar I disorder, most recent episode depressed ([place]) Diagnosis:   Patient Active Problem List   Diagnosis Date Noted  . Bipolar I disorder, most recent episode depressed ([place]) [F31.30] 03/01/2015    Priority: High  . PTSD (post-traumatic stress disorder) [F43.10] 02/27/2015    Priority: Medium  . Abnormal mammogram [R92.8] 05/27/2014  . Overuse of medication [Z91.14] 05/09/2014  . Constipation [K59.00] 04/02/2014  . Fatigue [R53.83] 12/13/2013  . Insomnia [G47.00] 08/30/2013  . GERD (gastroesophageal reflux disease) [K21.9]   . Migraine headache [G43.909]   . Hypothyroidism [E03.9]   . Bleeding duodenal ulcer [K26.4]    Total Time spent with patient: 15 minutes  Past Psychiatric History: See H&P  Past Medical History:  Past Medical History  Diagnosis Date  . GERD (gastroesophageal reflux disease)   . Migraine headache   . Hypothyroidism   . Depression   . Bleeding duodenal ulcer   . Kidney stones     Past Surgical History  Procedure Laterality Date  . Abdominal hysterectomy  2004    partial  . Cholecystectomy    . Tooth extraction     Family History:  Family History  Problem Relation Age of Onset  . Cancer Mother   . Cancer Father   . Alcohol abuse Sister   . Bipolar disorder Sister   . Alcohol abuse Maternal Grandmother   . Stroke Maternal Grandmother   . Stroke Paternal Grandmother    Family  Psychiatric  History: See H&P Social History:  History  Alcohol Use  . 0.0 oz/week  . 0 Standard drinks or equivalent per week     History  Drug Use No    Social History   Social History  . Marital Status: Married    Spouse Name: N/A  . Number of Children: N/A  . Years of Education: N/A   Social History Main Topics  . Smoking status: Never Smoker   . Smokeless tobacco: Never Used  . Alcohol Use: 0.0 oz/week    0 Standard drinks or equivalent per week  . Drug Use: No  . Sexual Activity: Yes    Birth Control/ Protection: None   Other Topics Concern  . None   Social History Narrative   Additional Social History:                         Sleep: Poor  Appetite:  Good  Current Medications: Current Facility-Administered Medications  Medication Dose Route Frequency Provider Last Rate Last Dose  . acetaminophen (TYLENOL) tablet 650 mg  650 mg Oral Q6H PRN Benjamine Mola, FNP      . clonazePAM (KLONOPIN) tablet 0.5 mg  0.5 mg Oral BID PRN Ursula Alert, MD   0.5 mg at 03/01/15 2301  . levothyroxine (SYNTHROID, LEVOTHROID) tablet 100 mcg  100 mcg Oral QAC breakfast Delfin Gant, NP   100 mcg at 03/02/15 0901  .  Linaclotide (LINZESS) capsule 145 mcg  145 mcg Oral Daily PRN Delfin Gant, NP      . lithium carbonate capsule 300 mg  300 mg Oral BID WC Saramma Eappen, MD   300 mg at 03/02/15 0900  . loratadine (CLARITIN) tablet 10 mg  10 mg Oral Daily Delfin Gant, NP   10 mg at 03/02/15 0901  . pantoprazole (PROTONIX) EC tablet 40 mg  40 mg Oral Daily Delfin Gant, NP   40 mg at 03/02/15 0901  . sertraline (ZOLOFT) tablet 50 mg  50 mg Oral Daily Benjamine Mola, FNP   50 mg at 03/02/15 0900  . SUMAtriptan (IMITREX) tablet 25 mg  25 mg Oral Q2H PRN Delfin Gant, NP   25 mg at 03/02/15 1443  . traZODone (DESYREL) tablet 150 mg  150 mg Oral QHS Benjamine Mola, FNP      . Vitamin D (Ergocalciferol) (DRISDOL) capsule 50,000 Units  50,000 Units  Oral Q7 days Delfin Gant, NP   50,000 Units at 02/27/15 1204    Lab Results: No results found for this or any previous visit (from the past 48 hour(s)).  Physical Findings: AIMS: Facial and Oral Movements Muscles of Facial Expression: None, normal Lips and Perioral Area: None, normal Jaw: None, normal Tongue: None, normal,Extremity Movements Upper (arms, wrists, hands, fingers): None, normal Lower (legs, knees, ankles, toes): None, normal, Trunk Movements Neck, shoulders, hips: None, normal, Overall Severity Severity of abnormal movements (highest score from questions above): None, normal Incapacitation due to abnormal movements: None, normal Patient's awareness of abnormal movements (rate only patient's report): No Awareness, Dental Status Current problems with teeth and/or dentures?: No Does patient usually wear dentures?: No  CIWA:    COWS:     Musculoskeletal: Strength & Muscle Tone: within normal limits Gait & Station: normal Patient leans: N/A  Psychiatric Specialty Exam: Review of Systems  Psychiatric/Behavioral: Positive for depression and substance abuse. Negative for suicidal ideas and hallucinations. The patient is nervous/anxious and has insomnia.   All other systems reviewed and are negative.   Blood pressure 104/72, pulse 94, temperature 97.4 F (36.3 C), temperature source Oral, resp. rate 18, height 5' (1.524 m), weight 56.7 kg (125 lb), SpO2 100 %.Body mass index is 24.41 kg/(m^2).  General Appearance: Casual and Fairly Groomed  Eye Contact::  Good  Speech:  Clear and Coherent and Normal Rate  Volume:  Normal  Mood:  Anxious and Depressed with worsening crying spells  Affect:  Appropriate, Congruent, Depressed and anxious and tearful  Thought Process:  Coherent, Goal Directed and Logical  Orientation:  Full (Time, Place, and Person)  Thought Content:  WDL  Suicidal Thoughts:  No  Homicidal Thoughts:  No  Memory:  Immediate;   Fair Recent;    Fair Remote;   Fair  Judgement:  Fair  Insight:  Fair  Psychomotor Activity:  Normal  Concentration:  Fair  Recall:  [employer] of Knowledge:Fair  Language: Fair  Akathisia:  No  Handed:    AIMS (if indicated):     Assets:  Communication Skills Desire for Improvement Resilience Social Support  ADL's:  Intact  Cognition: WNL  Sleep:  Number of Hours: 7   Treatment Plan Summary: Daily contact with patient to assess and evaluate symptoms and progress in treatment and Medication management  PLAN OF CARE:  -Patient will benefit from inpatient treatment and stabilization.  -Estimated length of stay is 5-7 days.  -Reviewed past medical  records,treatment plan.  -Continue Zoloft to 50mg  po daily for affective sx. -Lithium 300 mg po tonight and increase to 300 mg po bid today -Klonopin 0.5 mg po bid prn for breakthrough anxiety sx; pt has been very anxious/upset but has been using this once daily, may not realize it's twice daily; will advise nursing to encourage pt to use when she is extremely upset -Increase Trazodone to 150mg  po qhs for sleep. -Continue to monitor vitals ,medication compliance and treatment side effects while patient is here.  -Will monitor for medical issues as well as call consult as needed.  -CSW will start working on disposition. Patient to be referred for Trauma focussed therapy/CBT . Patient to participate in therapeutic milieu .   Benjamine Mola, FNP-BC 03/02/2015, 2:05 PM

## 2015-03-03 LAB — LITHIUM LEVEL: Lithium Lvl: 0.84 mmol/L (ref 0.60–1.20)

## 2015-03-03 NOTE — Plan of Care (Signed)
Problem: Alteration in mood Goal: LTG-Patient reports reduction in suicidal thoughts (Patient reports reduction in suicidal thoughts and is able to verbalize a safety plan for whenever patient is feeling suicidal)  Outcome: Progressing Pt denies SI at this time     

## 2015-03-03 NOTE — Progress Notes (Signed)
D: Pt denies SI/HI/AVH. Pt is pleasant and cooperative. Pt obsessed about going home. Pt did say she was doing a little better.   A: Pt was offered support and encouragement. Pt was given scheduled medications. Pt was encourage to attend groups. Q 15 minute checks were done for safety.   R:Pt attends groups and interacts well with peers and staff. Pt is taking medication. Pt has no complaints at this time .Pt receptive to treatment and safety maintained on unit.

## 2015-03-03 NOTE — Progress Notes (Signed)
Pt currently presents with a bright affect and jovial behavior. Per self inventory, pt rates depression at a 0, hopelessness 0 and anxiety 0. Pt's daily goal is to "I would love to go home, I am feeling so much better, I feel like a new person and I am happy." Pt reports good sleep, a good appetite, normal energy and  Good concentration. Pt's safety ensured with 15 minute and environmental checks. Pt currently denies SI/HI and A/V hallucinations. Pt verbally agrees to seek staff if SI/HI or A/VH occurs and to consult with staff before acting on these thoughts. Will continue POC.

## 2015-03-03 NOTE — Plan of Care (Signed)
Problem: Diagnosis: Increased Risk For Suicide Attempt Goal: LTG-Patient Will Show Positive Response to Medication LTG (by discharge) : Patient will show positive response to medication and will participate in the development of the discharge plan.  Outcome: Progressing Pt stated that her medications are working and feels ready to go home.

## 2015-03-03 NOTE — Progress Notes (Signed)
Recreation Therapy Notes  Date: 12.26.2016 Time: 9:30am  Location: [address] Day[address]   Group Topic: Stress Management  Goal Area(s) Addresses:  Patient will actively participate in stress management techniques presented during session.   Behavioral Response: Did not attend.   Laureen Ochs Vint Pola, LRT/CTRS         Zonnie Landen L 03/03/2015 12:10 PM

## 2015-03-03 NOTE — Progress Notes (Signed)
Adult Psychoeducational Group Note  Date:  03/03/2015 Time:  11:30 PM  Group Topic/Focus:  Wrap-Up Group:   The focus of this group is to help patients review their daily goal of treatment and discuss progress on daily workbooks.  Participation Level:  Active  Participation Quality:  Appropriate and Attentive  Affect:  Appropriate  Cognitive:  Alert, Appropriate and Oriented  Insight: Appropriate  Engagement in Group:  Engaged  Modes of Intervention:  Discussion and Education  Additional Comments:  Pt attended and participated in group.  Pt reported having a really good day and states that she is prepared for discharge tomorrow.   Milus Glazier 03/03/2015, 11:30 PM
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
## 2015-03-03 NOTE — BHH Group Notes (Signed)
   [facility] LCSW Aftercare Discharge Planning Group Note  03/03/2015  8:45 AM   Participation Quality: Alert, Appropriate and Oriented  Mood/Affect: Appropriate  Depression Rating: Reports low levels of depression  Anxiety Rating: Reports feeling anxious to return home  Thoughts of Suicide: Pt denies SI/HI  Will you contract for safety? Yes  Current AVH: Pt denies  Plan for Discharge/Comments: Pt attended discharge planning group and actively participated in group. CSW provided pt with today's workbook. Patient plans to return home with her family in [place] to follow up with outpatient services.  Transportation Means: Pt reports access to transportation  Supports: No supports mentioned at this time  Tilden Fossa, MSW, [place] Social Worker [employer] [phone]

## 2015-03-03 NOTE — Progress Notes (Signed)
Pt is in the dayroom smiling and interacting with peers. Introduced self to pt. Offered support and 15 minute checks. Pt denies any needs presently. Environment is secured and safety maintained on the unit.

## 2015-03-04 MED ORDER — LITHIUM CARBONATE 300 MG PO CAPS: 300.0000 mg | ORAL_CAPSULE | Freq: Two times a day (BID) | ORAL | Status: DC

## 2015-03-04 MED ORDER — LEVOTHYROXINE SODIUM 100 MCG PO TABS: 100.0000 ug | ORAL_TABLET | Freq: Every day | ORAL | Status: DC

## 2015-03-04 MED ORDER — CLONAZEPAM 0.5 MG PO TABS: 0.5000 mg | ORAL_TABLET | Freq: Two times a day (BID) | ORAL | Status: DC | PRN

## 2015-03-04 MED ORDER — TRAZODONE HCL 150 MG PO TABS: 150.0000 mg | ORAL_TABLET | Freq: Every day | ORAL | Status: DC

## 2015-03-04 MED ORDER — SERTRALINE HCL 50 MG PO TABS: 50.0000 mg | ORAL_TABLET | Freq: Every day | ORAL | Status: DC
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
## 2015-03-04 NOTE — Tx Team (Signed)
Interdisciplinary Treatment Plan Update (Adult) Date: 03/04/2015   Date: 03/04/2015 1:25 PM  Progress in Treatment:  Attending groups: Yes  Participating in groups: No  Taking medication as prescribed: Yes  Tolerating medication: Yes  Family/Significant othe contact made: Yes with husband Patient understands diagnosis: Yes Discussing patient identified problems/goals with staff: Yes  Medical problems stabilized or resolved: Yes  Denies suicidal/homicidal ideation: Yes Patient has not harmed self or Others: Yes   New problem(s) identified: None identified at this time.   Discharge Plan or Barriers: Pt will return home and follow-up with her PCP; declines therapy referral.  Additional comments:  Patient and CSW reviewed pt's identified goals and treatment plan. Patient verbalized understanding and agreed to treatment plan. CSW reviewed [facility] "Discharge Process and Patient Involvement" Form. Pt verbalized understanding of information provided and signed form.   Reason for Continuation of Hospitalization:  Anxiety Depression Medication stabilization Suicidal ideation  Estimated length of stay: 3-5 days  Review of initial/current patient goals per problem list:   1.  Goal(s): Patient will participate in aftercare plan  Met:  Yes  Target date: 3-5 days from date of admission   As evidenced by: Patient will participate within aftercare plan AEB aftercare provider and housing plan at discharge being identified.   02/27/15: Pt will follow-up with her PCP; return home with husband.  2.  Goal (s): Patient will exhibit decreased depressive symptoms and suicidal ideations.  Met:  Yes  Target date: 3-5 days from date of admission   As evidenced by: Patient will utilize self rating of depression at 3 or below and demonstrate decreased signs of depression or be deemed stable for discharge by MD. 02/27/15: Pt was admitted with symptoms of depression, rating 10/10. Pt continues to  present with flat affect and depressive symptoms.  Pt will demonstrate decreased symptoms of depression and rate depression at 3/10 or lower prior to discharge. 03/04/15: Pt rates depression at 2/10; denies SI  3.  Goal(s): Patient will demonstrate decreased signs and symptoms of anxiety.  Met:  Yes  Target date: 3-5 days from date of admission   As evidenced by: Patient will utilize self rating of anxiety at 3 or below and demonstrated decreased signs of anxiety, or be deemed stable for discharge by MD 02/27/15: Pt was admitted with increased levels of anxiety and is currently rating those symptoms highly. Pt will demonstrated decreased symptoms of anxiety and rate it at 3/10 prior to d/c. 03/04/15: Pt rates anxiety at 3/10  Attendees:  Patient:    Family:    Physician: Dr. Parke Poisson  03/04/2015 1:25 PM  Nursing: Lars Pinks, RN Case manager  03/04/2015 1:25 PM  Clinical Social Worker Peri Maris, [place] 03/04/2015 1:25 PM  Other: Erasmo Downer Drinkard, LCSWA 03/04/2015 1:25 PM  Clinical: Manuella Ghazi RN 03/04/2015 1:25 PM  Other: , RN Charge Nurse 03/04/2015 1:25 PM  Other:     Peri Maris, [place] Work [phone]

## 2015-03-04 NOTE — BHH Group Notes (Signed)
[place] Group Notes:  (Nursing/MHT/Case Management/Adjunct)  Date:  03/04/2015  Time:  12:47 PM  Type of Therapy:  Nurse Education  Participation Level:  Active  Participation Quality:  Appropriate and Attentive  Affect:  Appropriate  Cognitive:  Alert and Appropriate  Insight:  Appropriate and Good  Engagement in Group:  Engaged and Supportive  Modes of Intervention:  Discussion and Education  Summary of Progress/Problems:  Group topic was Recovery and what does recovery mean to each of [place].  Talked about taking care of ourselves to be able to help take care of other.  Maydelin shared that she didn't realize how much she missed taking time for herself.  "I spent so much time taking care of other that I didn't do anything for myself."  She is very excited about leaving today and was supportive of other members of the group.  [place] 03/04/2015, 12:47 PM

## 2015-03-04 NOTE — Progress Notes (Signed)
DAR Note: Tanya Nguyen has been up and visible on the unit.  Attending groups and interacting with peers.  She denies any SI/HI or A/V hallucinations.  She reports that she feels the best she has in a long time and she is ready to leave.  She completed her self inventory and reports that her depression, anxiety and hopelessness are 0/10.  She states that her goal for today is "to go home and use what I have learned here" and she will accomplish this goal by "learning to work on my goal everyday."  Pt. D/C from the unit accompanied by husband.  She was pleasant and cooperative. D/C follow up paperwork reviewed with pt and copy sent as well as prescriptions.  She has no belongings in a locker. Q 15 min checks maintained until discharge.

## 2015-03-04 NOTE — Progress Notes (Signed)
  [facility] Adult Case Management Discharge Plan :  Will you be returning to the same living situation after discharge:  Yes,  Pt returningh home At discharge, do you have transportation home?: Yes,  Pt husband to provide transportation Do you have the ability to pay for your medications: Yes,  Pt provided with prescriptions  Release of information consent forms completed and in the chart;  Patient's signature needed at discharge.  Patient to Follow up at: Follow-up Information    Follow up with Virgel Manifold On 03/19/2015.   Why:  Hospital follow up appointment with Dr. Deborra Medina on [place]. Jan. 11th at 11am. Call office if you need to reschedule.   Contact information:   [address] [place], [place] [phone] . Fax [phone]       Next level of care provider has access to [place] and Suicide Prevention discussed: Yes,  with husband; see SPE note for further detail  Have you used any form of tobacco in the last 30 days? (Cigarettes, Smokeless Tobacco, Cigars, and/or Pipes): No  Has patient been referred to the Quitline?: N/A patient is not a smoker  Patient has been referred for addiction treatment: N/A  Bo Mcclintock 03/04/2015, 1:27 PM

## 2015-03-04 NOTE — Psychosocial Assessment (Signed)
D: Pt denies SI/HI/AVH. Pt is pleasant and cooperative. Pt stated " she never felt happier". Pt appears very happy, and pleased about her stay. Pt plans to get mental health help when she goes home and hopes to get set up with a support group. Pt believes her medications are adjusted right at this time. Pt very ready to go home.   A: Pt was offered support and encouragement. Pt was given scheduled medications. Pt was encourage to attend groups. Q 15 minute checks were done for safety.   R:Pt attends groups and interacts well with peers and staff. Pt is taking medication. Pt has no complaints at this time .Pt receptive to treatment and safety maintained on unit.
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
## 2015-03-19 ENCOUNTER — Encounter: Payer: Self-pay | Admitting: Family Medicine

## 2015-03-19 ENCOUNTER — Ambulatory Visit (INDEPENDENT_AMBULATORY_CARE_PROVIDER_SITE_OTHER): Payer: BLUE CROSS/BLUE SHIELD | Admitting: Family Medicine

## 2015-03-19 VITALS — BP 132/82 | HR 88 | Temp 98.0°F | Wt 132.0 lb

## 2015-03-19 DIAGNOSIS — F3130 Bipolar disorder, current episode depressed, mild or moderate severity, unspecified: Secondary | ICD-10-CM

## 2015-03-19 DIAGNOSIS — F4310 Post-traumatic stress disorder, unspecified: Secondary | ICD-10-CM | POA: Diagnosis not present

## 2015-03-19 DIAGNOSIS — R45851 Suicidal ideations: Secondary | ICD-10-CM | POA: Diagnosis not present

## 2015-03-19 NOTE — Assessment & Plan Note (Signed)
Resolved

## 2015-03-19 NOTE — Progress Notes (Signed)
Subjective:   Patient ID: Tanya Nguyen, female    DOB: 11-28-1965, 50 y.o.   MRN: [id number]  Tanya Nguyen is a pleasant 50 y.o. year old female who presents to clinic today with Hospitalization Follow-up  on 03/19/2015  HPI:  Admitted to behavioral health on 02/26/15 after I sent her to ED for suicidal ideation from our office.  Notes reviewed.  Was evaluated and treated by psychiatry and per notes, responded well to zoloft 50 mg daily, Trazodone 150 mg daily and Lithium 300 mg daily. Improvement in her symptoms was noted and discharged home once it felt she was no longer suicidal and was stable for discharge. Diagnosed with PTSD, Bipolar I.  She feels "so much better."  Denies any current SI or HI.  No longer feels depressed.  Husband has been supportive. Recent Results (from the past 2160 hour(s))  H. pylori antibody, IgG     Status: None   Collection Time: 01/20/15 11:48 AM  Result Value Ref Range   H Pylori IgG Negative Negative  Vitamin B12     Status: Abnormal   Collection Time: 01/20/15 11:48 AM  Result Value Ref Range   Vitamin B-12 1455 (H) 211 - 911 pg/mL  VITAMIN D 25 Hydroxy (Vit-D Deficiency, Fractures)     Status: Abnormal   Collection Time: 01/20/15 11:48 AM  Result Value Ref Range   VITD 29.83 (L) 30.00 - 100.00 ng/mL  TSH     Status: None   Collection Time: 01/20/15 11:48 AM  Result Value Ref Range   TSH 2.58 0.35 - 4.50 uIU/mL  T4, free     Status: None   Collection Time: 01/20/15 11:48 AM  Result Value Ref Range   Free T4 0.76 0.60 - 1.60 ng/dL  Ethanol (ETOH)     Status: None   Collection Time: 02/26/15  1:18 PM  Result Value Ref Range   Alcohol, Ethyl (B) <5 <5 mg/dL    Comment:        LOWEST DETECTABLE LIMIT FOR SERUM ALCOHOL IS 5 mg/dL FOR MEDICAL PURPOSES ONLY   Salicylate level     Status: None   Collection Time: 02/26/15  1:18 PM  Result Value Ref Range   Salicylate Lvl <5.8 2.8 - 30.0 mg/dL  Acetaminophen level     Status: Abnormal   Collection Time: 02/26/15  1:18 PM  Result Value Ref Range   Acetaminophen (Tylenol), Serum <10 (L) 10 - 30 ug/mL    Comment:        THERAPEUTIC CONCENTRATIONS VARY SIGNIFICANTLY. A RANGE OF 10-30 ug/mL MAY BE AN EFFECTIVE CONCENTRATION FOR MANY PATIENTS. HOWEVER, SOME ARE BEST TREATED AT CONCENTRATIONS OUTSIDE THIS RANGE. ACETAMINOPHEN CONCENTRATIONS >150 ug/mL AT 4 HOURS AFTER INGESTION AND >50 ug/mL AT 12 HOURS AFTER INGESTION ARE OFTEN ASSOCIATED WITH TOXIC REACTIONS.   Comprehensive metabolic panel     Status: Abnormal   Collection Time: 02/26/15  1:27 PM  Result Value Ref Range   Sodium 139 135 - 145 mmol/L   Potassium 3.9 3.5 - 5.1 mmol/L   Chloride 107 101 - 111 mmol/L   CO2 23 22 - 32 mmol/L   Glucose, Bld 85 65 - 99 mg/dL   BUN 20 6 - 20 mg/dL   Creatinine, Ser 0.92 0.44 - 1.00 mg/dL   Calcium 8.8 (L) 8.9 - 10.3 mg/dL   Total Protein 7.3 6.5 - 8.1 g/dL   Albumin 4.2 3.5 - 5.0 g/dL   AST 20 15 - 41 U/L  ALT 10 (L) 14 - 54 U/L   Alkaline Phosphatase 57 38 - 126 U/L   Total Bilirubin 0.6 0.3 - 1.2 mg/dL   GFR calc non Af Amer >60 >60 mL/min   GFR calc Af Amer >60 >60 mL/min    Comment: (NOTE) The eGFR has been calculated using the CKD EPI equation. This calculation has not been validated in all clinical situations. eGFR's persistently <60 mL/min signify possible Chronic Kidney Disease.    Anion gap 9 5 - 15  CBC     Status: None   Collection Time: 02/26/15  1:27 PM  Result Value Ref Range   WBC 5.1 4.0 - 10.5 K/uL   RBC 4.59 3.87 - 5.11 MIL/uL   Hemoglobin 13.7 12.0 - 15.0 g/dL   HCT 41.9 36.0 - 46.0 %   MCV 91.3 78.0 - 100.0 fL   MCH 29.8 26.0 - 34.0 pg   MCHC 32.7 30.0 - 36.0 g/dL   RDW 13.0 11.5 - 15.5 %   Platelets 323 150 - 400 K/uL  Urine rapid drug screen (hosp performed) (Not at [facility])     Status: Abnormal   Collection Time: 02/26/15  1:30 PM  Result Value Ref Range   Opiates POSITIVE (A) NONE DETECTED   Cocaine NONE DETECTED NONE DETECTED    Benzodiazepines POSITIVE (A) NONE DETECTED   Amphetamines NONE DETECTED NONE DETECTED   Tetrahydrocannabinol NONE DETECTED NONE DETECTED   Barbiturates NONE DETECTED NONE DETECTED    Comment:        DRUG SCREEN FOR MEDICAL PURPOSES ONLY.  IF CONFIRMATION IS NEEDED FOR ANY PURPOSE, NOTIFY LAB WITHIN 5 DAYS.        LOWEST DETECTABLE LIMITS FOR URINE DRUG SCREEN Drug Class       Cutoff (ng/mL) Amphetamine      1000 Barbiturate      200 Benzodiazepine   177 Tricyclics       939 Opiates          300 Cocaine          300 THC              50   Lithium level     Status: None   Collection Time: 03/03/15  6:20 AM  Result Value Ref Range   Lithium Lvl 0.84 0.60 - 1.20 mmol/L    Comment: Performed at [facility]    Current Outpatient Prescriptions on File Prior to Visit  Medication Sig Dispense Refill  . cetirizine (ZYRTEC) 10 MG tablet Take 10 mg by mouth daily as needed for allergies.     . clonazePAM (KLONOPIN) 0.5 MG tablet Take 1 tablet (0.5 mg total) by mouth 2 (two) times daily as needed (anxiety). 10 tablet 0  . levothyroxine (SYNTHROID, LEVOTHROID) 100 MCG tablet Take 1 tablet (100 mcg total) by mouth daily before breakfast. 30 tablet 0  . Linaclotide (LINZESS) 145 MCG CAPS capsule Take 1 capsule (145 mcg total) by mouth daily. (Patient taking differently: Take 145 mcg by mouth daily as needed (constipation). ) 30 capsule 3  . lithium carbonate 300 MG capsule Take 1 capsule (300 mg total) by mouth 2 (two) times daily with a meal. 30 capsule 0  . omeprazole (PRILOSEC) 40 MG capsule TAKE ONE (1) CAPSULE EACH DAY (Patient taking differently: TAKE 40 mg by mouth daily if needed for acid reflux/heartburn) 30 capsule 11  . sertraline (ZOLOFT) 50 MG tablet Take 1 tablet (50 mg total) by mouth daily. 30 tablet  0  . SUMAtriptan (IMITREX) 25 MG tablet TAKE ONE TABLET BY MOUTH EVERY 2 HOURS AS NEEDED FOR MIGRAINE. MAY REPEAT IN 2 HOURS IF HEADACHE PERSISTS OR RECURS 10  tablet 5  . traZODone (DESYREL) 150 MG tablet Take 1 tablet (150 mg total) by mouth at bedtime. 30 tablet 0   No current facility-administered medications on file prior to visit.    Allergies  Allergen Reactions  . Penicillins Shortness Of Breath, Diarrhea and Nausea And Vomiting    Has patient had a PCN reaction causing immediate rash, facial/tongue/throat swelling, SOB or lightheadedness with hypotension: yes Has patient had a PCN reaction causing severe rash involving mucus membranes or skin necrosis: no Has patient had a PCN reaction that required hospitalization no Has patient had a PCN reaction occurring within the last 10 years: about 10 years If all of the above answers are "NO", then may proceed with Cephalosporin use.   . Nsaids     Pt states she is not allergic   . Sulfa Antibiotics Nausea And Vomiting    Past Medical History  Diagnosis Date  . GERD (gastroesophageal reflux disease)   . Migraine headache   . Hypothyroidism   . Depression   . Bleeding duodenal ulcer   . Kidney stones     Past Surgical History  Procedure Laterality Date  . Abdominal hysterectomy  2004    partial  . Cholecystectomy    . Tooth extraction      Family History  Problem Relation Age of Onset  . Cancer Mother   . Cancer Father   . Alcohol abuse Sister   . Bipolar disorder Sister   . Alcohol abuse Maternal Grandmother   . Stroke Maternal Grandmother   . Stroke Paternal Grandmother     Social History   Social History  . Marital Status: Married    Spouse Name: N/A  . Number of Children: N/A  . Years of Education: N/A   Occupational History  . Not on file.   Social History Main Topics  . Smoking status: Never Smoker   . Smokeless tobacco: Never Used  . Alcohol Use: 0.0 oz/week    0 Standard drinks or equivalent per week  . Drug Use: No  . Sexual Activity: Yes    Birth Control/ Protection: None   Other Topics Concern  . Not on file   Social History Narrative    The PMH, PSH, Social History, Family History, Medications, and allergies have been reviewed in [facility], and have been updated if relevant.   Review of Systems  Psychiatric/Behavioral: Negative for suicidal ideas, hallucinations, behavioral problems, confusion, sleep disturbance, self-injury, dysphoric mood, decreased concentration and agitation. The patient is not nervous/anxious and is not hyperactive.        Objective:    BP 132/82 mmHg  Pulse 88  Temp(Src) 98 F (36.7 C) (Oral)  Wt 132 lb (59.875 kg)  SpO2 98%   Physical Exam  Constitutional: She is oriented to person, place, and time. She appears well-developed and well-nourished. No distress.  HENT:  Head: Normocephalic.  Eyes: Conjunctivae are normal.  Cardiovascular: Normal rate.   Pulmonary/Chest: Effort normal.  Musculoskeletal: Normal range of motion. She exhibits no edema.  Neurological: She is alert and oriented to person, place, and time. No cranial nerve deficit.  Skin: Skin is warm and dry. She is not diaphoretic.  Psychiatric: She has a normal mood and affect. Her behavior is normal. Judgment and thought content normal.  Nursing note and  vitals reviewed.         Assessment & Plan:   Suicidal ideation  Bipolar I disorder, most recent episode depressed ([place])  PTSD (post-traumatic stress disorder) No Follow-up on file.

## 2015-03-19 NOTE — Patient Instructions (Signed)
It was great to see you. Please make an appointment with your therapist like we discussed.

## 2015-03-19 NOTE — Assessment & Plan Note (Signed)
Symptoms currently controlled on current rxs. She is in process of making an appointment with new therapist/psychiatrist as an outpatient.

## 2015-03-24 ENCOUNTER — Telehealth: Payer: Self-pay | Admitting: *Deleted

## 2015-03-24 DIAGNOSIS — F3130 Bipolar disorder, current episode depressed, mild or moderate severity, unspecified: Secondary | ICD-10-CM

## 2015-03-24 DIAGNOSIS — F4310 Post-traumatic stress disorder, unspecified: Secondary | ICD-10-CM

## 2015-03-24 NOTE — Telephone Encounter (Signed)
Needs to be refilled by her psychiatrist.

## 2015-03-24 NOTE — Telephone Encounter (Signed)
Pt has recent Dx of bipolar. pls advise

## 2015-03-25 MED ORDER — LITHIUM CARBONATE 300 MG PO CAPS: 300.0000 mg | ORAL_CAPSULE | Freq: Two times a day (BID) | ORAL | Status: DC

## 2015-03-25 NOTE — Addendum Note (Signed)
Addended by: Lucille Passy on: 03/25/2015 11:04 AM   Modules accepted: Orders

## 2015-03-25 NOTE — Telephone Encounter (Signed)
Ok to refill one time only and I will place referral to psychiatry.

## 2015-03-25 NOTE — Telephone Encounter (Signed)
Pt advised. [place] and also put on their WQ. Pt states she will call to get appt.

## 2015-03-25 NOTE — Addendum Note (Signed)
Addended by: Modena Nunnery on: 03/25/2015 11:42 AM   Modules accepted: Orders

## 2015-03-25 NOTE — Telephone Encounter (Signed)
Rx sent to pharmacy as requested.

## 2015-03-25 NOTE — Telephone Encounter (Signed)
Ok to refill until her appointment.  I have no issues with that at all.

## 2015-03-25 NOTE — Telephone Encounter (Signed)
Pt called about getting lithium refilled. Pt notified as instructed. Pt was seen on 03/19/15 and pt states she does not have psychiatrist; at the 03/19/15 pt said she was talking about going to therapy meetings but pt does not have psychiatrist and does not know who to go to. Pt request referral to lady psychiatrist in [place]. Pt request cb.

## 2015-03-25 NOTE — Telephone Encounter (Signed)
Spoke with [facility], re: referral.  They are currently scheduling into Mid to Late March for new patient's appointments.  They will get pt scheduled, however pt is very concerned that she will run out of her lithium before she can be seen. Best number to call pt is [phone]

## 2015-03-26 MED ORDER — LITHIUM CARBONATE 300 MG PO CAPS: 300.0000 mg | ORAL_CAPSULE | Freq: Two times a day (BID) | ORAL | Status: DC

## 2015-03-26 NOTE — Addendum Note (Signed)
Addended by: Modena Nunnery on: 03/26/2015 08:48 AM   Modules accepted: Orders

## 2015-03-26 NOTE — Telephone Encounter (Signed)
Additional Rx sent to pharmacy, for additional month, to last until pts OV

## 2015-03-28 ENCOUNTER — Telehealth: Payer: Self-pay | Admitting: Family Medicine

## 2015-03-28 MED ORDER — SERTRALINE HCL 50 MG PO TABS: 50.0000 mg | ORAL_TABLET | Freq: Every day | ORAL | Status: DC

## 2015-03-28 NOTE — Telephone Encounter (Signed)
Pt called wanting to talk to [place]

## 2015-03-28 NOTE — Telephone Encounter (Signed)
Pt called, seemed upset that she had to go to a psychologist and a psychiatrist.  States she doesn't like going says it makes her feel worse.  She only has 9 Zoloft left and says that they were given to her in the [facility] hospital.  Encouraged her to keep her appointments with Psychologist on 04/14/15, Dr. Nils Pyle and Dr. Claretta Fraise psychiatrist mid march.  She is going to call her insurance company to see how much they will cover on her behavioral health visits.  She will call back to speak to Vaughan Basta if she has any questions.    Pt wants to know if PCP will consider giving    sertraline (ZOLOFT) 50 MG tablet refill.

## 2015-03-28 NOTE — Telephone Encounter (Signed)
She was diagnosed with bipolar disorder recently and it is in her best interest to follow with psychiatry for a while.  Yes, we can refill her zoloft.

## 2015-03-28 NOTE — Addendum Note (Signed)
Addended by: Modena Nunnery on: 03/28/2015 01:21 PM   Modules accepted: Orders

## 2015-03-28 NOTE — Addendum Note (Signed)
Addended by: Modena Nunnery on: 03/28/2015 04:02 PM   Modules accepted: Orders

## 2015-03-31 NOTE — Telephone Encounter (Signed)
Noted! Thank you

## 2015-03-31 NOTE — Telephone Encounter (Signed)
Pt walked in, met with her re: costs of her Behavioral Health referrals and the costs of her medications.  Pt given Financial aid pkg and several phone numbers to call re: orange card and [employer] [place].  She will call back if she has any questions.   / lt

## 2015-04-08 ENCOUNTER — Other Ambulatory Visit: Payer: Self-pay

## 2015-04-08 MED ORDER — TRAZODONE HCL 150 MG PO TABS: 150.0000 mg | ORAL_TABLET | Freq: Every day | ORAL | Status: DC

## 2015-04-08 MED ORDER — LITHIUM CARBONATE 300 MG PO CAPS: 300.0000 mg | ORAL_CAPSULE | Freq: Two times a day (BID) | ORAL | Status: DC

## 2015-04-08 NOTE — Addendum Note (Signed)
Addended by: Modena Nunnery on: 04/08/2015 01:05 PM   Modules accepted: Orders

## 2015-04-08 NOTE — Telephone Encounter (Signed)
Pt request refill lithium(last refilled # 30 on 03/26/15; pt takes med bid) to [facility]. Call when refill done. Pt has counselor appt on 04/15/15 and has psychiatric appt end of march. Pt also request refill tranzodone(last refilled # 30 on 03/04/15 by Ricky Ala NP) to [facility]. Pt last seen 03/26/15.

## 2015-04-14 ENCOUNTER — Ambulatory Visit (INDEPENDENT_AMBULATORY_CARE_PROVIDER_SITE_OTHER): Payer: BLUE CROSS/BLUE SHIELD | Admitting: Licensed Clinical Social Worker

## 2015-04-14 ENCOUNTER — Telehealth: Payer: Self-pay | Admitting: Family Medicine

## 2015-04-14 DIAGNOSIS — F3162 Bipolar disorder, current episode mixed, moderate: Secondary | ICD-10-CM

## 2015-04-14 DIAGNOSIS — F4310 Post-traumatic stress disorder, unspecified: Secondary | ICD-10-CM

## 2015-04-14 DIAGNOSIS — F3130 Bipolar disorder, current episode depressed, mild or moderate severity, unspecified: Secondary | ICD-10-CM
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
## 2015-04-14 NOTE — Telephone Encounter (Signed)
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  She would like to go to someone else Can you make her another referral to see someone else  She prefers a female

## 2015-04-14 NOTE — Telephone Encounter (Signed)
Im sorry to hear that.  Referral placed. 

## 2015-04-15 ENCOUNTER — Telehealth: Payer: Self-pay | Admitting: Family Medicine

## 2015-04-15 NOTE — Telephone Encounter (Signed)
error 

## 2015-04-28 ENCOUNTER — Other Ambulatory Visit: Payer: Self-pay | Admitting: Family Medicine

## 2015-04-28 MED ORDER — LITHIUM CARBONATE 300 MG PO CAPS: 300.0000 mg | ORAL_CAPSULE | Freq: Two times a day (BID) | ORAL | Status: DC

## 2015-04-28 MED ORDER — SERTRALINE HCL 50 MG PO TABS: 50.0000 mg | ORAL_TABLET | Freq: Every day | ORAL | Status: DC

## 2015-04-28 NOTE — Telephone Encounter (Signed)
Ok to refill rxs as requested. 

## 2015-04-28 NOTE — Telephone Encounter (Signed)
Expand All Collapse All   Pt request refill lithium(last refilled # 30 on 04/08/15; pt takes med bid) to [facility]. Call when refill done. Pt is seeing counselor regularly and has psychiatric appt 05/14/2015. Pt also request refill Zoloft 50 mg (last refilled # 30 on 1/20/17b) [facility]. Pt last seen 03/26/15. / lt

## 2015-04-28 NOTE — Telephone Encounter (Signed)
Please advise how many refills

## 2015-05-16 ENCOUNTER — Telehealth: Payer: Self-pay

## 2015-05-16 NOTE — Telephone Encounter (Signed)
[facility] dispo was call Monday for appt - would recommend pt call psych. Will defer to PCP.

## 2015-05-16 NOTE — Telephone Encounter (Signed)
PLEASE NOTE: All timestamps contained within this report are represented as [place] Standard Time. CONFIDENTIALTY NOTICE: This fax transmission is intended only for the addressee. It contains information that is legally privileged, confidential or otherwise protected from use or disclosure. If you are not the intended recipient, you are strictly prohibited from reviewing, disclosing, copying using or disseminating any of this information or taking any action in reliance on or regarding this information. If you have received this fax in error, please notify us immediately by telephone so that we can arrange for its return to [place]. Phone: [phone], Toll-Free: [phone], Fax: [phone] Page: 1 of 2 Call Id: [id number] [place] Patient Name: Tanya Nguyen Gender: Female DOB: Mar 22, 1965 Age: 50 Y 41 M 18 D Return Phone Number: [phone] (Primary), [phone] (Secondary) Address: [address] City/State/Zip: [place] [address] Client [facility] - Client Client Site [facility] - Day Physician Arnette Norris Contact Type Call Who Is Calling Patient / Member / Family / Caregiver Call Type Triage / Clinical Relationship To Patient Self Return Phone Number [phone] (Primary) Chief Complaint Medication reaction Reason for Call Symptomatic / Request for [place] states she feels "movements in her body", like flutters. Also states she is having trouble going to sleep. Started about a week and a half ago. Also is on a medication called Litrium? 600mg /day and Zoloft. Also more saliva production. Appointment Disposition EMR Appointment Not Necessary Info pasted into Epic No PreDisposition Call Doctor Translation No Nurse Assessment Nurse: Denyse Amass, RN, Benjamine Mola Date/Time (Eastern Time): 05/16/2015 3:13:44  PM Confirm and document reason for call. If symptomatic, describe symptoms. You must click the next button to save text entered. ---Patient states she is taking Lithium and Zoloft. States she has a lot of "slobber" in her mouth. States she feels "flickering" inside her body. This has been going on for a week and a half. Denies pain, Denies difficulty breathing. States she is Bipolar. States these medications were prescribed by Dr. Coral Else at [facility] at [address] [place], [place] [address] [phone]. Patient states she saw Dr. Coral Else 2 days ago and she told him that she was having [place] in her mouth and flickering in her body. Has the patient traveled out of the country within the last 30 days? ---Not Applicable Does the patient have any new or worsening symptoms? ---Yes Will a triage be completed? ---Yes Related visit to physician within the last 2 weeks? ---No Does the PT have any chronic conditions? (i.e. diabetes, asthma, etc.) ---Yes List chronic conditions. ---Bipolar Is the patient pregnant or possibly pregnant? (Ask all females between the ages of 24-55) ---No Is this a behavioral health or substance abuse call? ---No PLEASE NOTE: All timestamps contained within this report are represented as [place] Standard Time. CONFIDENTIALTY NOTICE: This fax transmission is intended only for the addressee. It contains information that is legally privileged, confidential or otherwise protected from use or disclosure. If you are not the intended recipient, you are strictly prohibited from reviewing, disclosing, copying using or disseminating any of this information or taking any action in reliance on or regarding this information. If you have received this fax in error, please notify us immediately by telephone so that we can arrange for its return to [place]. Phone: [phone], Toll-Free: [phone], Fax: [phone] Page: 2 of 2 Call Id:  [id number] Guidelines Guideline Title Affirmed  Question Affirmed Notes Nurse Date/Time Eilene Ghazi Time) Anxiety and Panic Attack [1] Symptoms of anxiety or panic attack AND [2] is a chronic symptom (recurrent or ongoing AND present > 4 weeks) [place], RN, Benjamine Mola 05/16/2015 3:26:20 PM Disp. Time Eilene Ghazi Time) Disposition Final User 05/16/2015 3:28:47 PM See PCP within 2 Weeks Yes [place], RN, Lorin Glass Understands: Yes Disagree/Comply: Comply Care Advice Given Per Guideline SEE PCP WITHIN 2 WEEKS: You need an evaluation for this ongoing problem within the next 2 weeks. Call your doctor during regular office hours and make an appointment. REASSURANCE - ANXIETY: * Anxiety is a normal reaction to a stressful situation. * Anxiety and panic attacks can be treated successfully. * Support of family friends, clergy (etc.) is important. * Allow the patient to talk about their feelings. PANIC ATTACK TREATMENT: * Reassure the patient that panic attacks are not harmful. Acknowledge how scary this must feel. * Ask them what they have done in the past that has helped. * Speak calmly, encourage the patient to take slow deep breaths of air using diaphragmatic breathing (abdominal breathing). Take 1 breath every 5 seconds (12 breaths a minute). By focusing the patient on controlling his/her breathing, the patient is taking control of the panic attack. * Suggest a calming activity (e.g., going for a walk, listening to music, taking a warm bath/shower) HEALTHY LIFESTYLE BASICS: * Sleep - Try to get sufficient amount of sleep. Most people need 7-8 hours of sleep each night. * Regular exercise will improve your overall health, improve your mood, and is a simple method to reduce stress. * Eat a balanced healthy diet. * Drink adequate liquids - 6-8 glasses of water daily. AVOID CAFFEINE: Avoid caffeine-containing beverages. (Reason: it is a stimulant and can aggravate anxiety) Examples include coffee,  tea, colas. AVOID TRIGGERS OF ANXIETY: * Limit your alcohol consumption to no more than 2 drinks a day. After dinner drinking causes insomnia 3-4 hours after falling asleep. * For smokers - Stop or reduce your smoking (Reason: nicotine is a stimulant) * Avoid diet pills (Reason: they act as stimulants.) * Talk to your doctor before taking any herbal supplements (Reason: some have side effects) CALL BACK IF: * You feel like harming yourself * You become worse. Comments User: Lin Givens, RN Date/Time Eilene Ghazi Time): 05/16/2015 3:29:30 PM Patient will call on Monday to make an appointment. Referrals REFERRED TO PCP OFFICE

## 2015-05-16 NOTE — Telephone Encounter (Signed)
Dr Deborra Medina is out of office; sending not to Dr Deborra Medina and Dr Danise Mina.

## 2015-05-20 ENCOUNTER — Ambulatory Visit: Payer: Self-pay | Admitting: Family Medicine

## 2015-05-29 ENCOUNTER — Ambulatory Visit: Payer: BLUE CROSS/BLUE SHIELD | Admitting: Psychiatry

## 2015-09-01 ENCOUNTER — Other Ambulatory Visit: Payer: Self-pay | Admitting: *Deleted

## 2015-09-01 MED ORDER — LEVOTHYROXINE SODIUM 100 MCG PO TABS: 100.0000 ug | ORAL_TABLET | Freq: Every day | ORAL | Status: DC

## 2015-10-02 ENCOUNTER — Other Ambulatory Visit: Payer: Self-pay | Admitting: Family Medicine

## 2016-01-22 NOTE — Telephone Encounter (Signed)
Opened in error

## 2016-02-18 ENCOUNTER — Ambulatory Visit (INDEPENDENT_AMBULATORY_CARE_PROVIDER_SITE_OTHER): Payer: BLUE CROSS/BLUE SHIELD | Admitting: Family Medicine

## 2016-02-18 ENCOUNTER — Encounter: Payer: Self-pay | Admitting: Family Medicine

## 2016-02-18 VITALS — BP 118/70 | HR 71 | Temp 97.9°F | Wt 123.5 lb

## 2016-02-18 DIAGNOSIS — J069 Acute upper respiratory infection, unspecified: Secondary | ICD-10-CM

## 2016-02-18 DIAGNOSIS — Z23 Encounter for immunization: Secondary | ICD-10-CM

## 2016-02-18 MED ORDER — BENZONATATE 200 MG PO CAPS: 200.0000 mg | ORAL_CAPSULE | Freq: Two times a day (BID) | ORAL | 0 refills | Status: DC | PRN

## 2016-02-18 MED ORDER — AZITHROMYCIN 250 MG PO TABS: ORAL_TABLET | ORAL | 0 refills | Status: DC

## 2016-02-18 NOTE — Patient Instructions (Signed)
Great to see you.  Happy Holidays.  Take antibiotic as directed.  Drink lots of fluids.    Treat sympotmatically with Mucinex, nasal saline irrigation, and Tylenol/Ibuprofen.  You can use warm compresses.  Cough suppressant as needed.   Call if not improving as expected in 5-7 days.

## 2016-02-18 NOTE — Progress Notes (Signed)
SUBJECTIVE:  Tanya Nguyen is a 50 y.o. female who complains of coryza, congestion, sneezing, sore throat and bilateral sinus pain for 14 days. She denies a history of anorexia and chest pain and denies a history of asthma. Patient denies smoke cigarettes.   Current Outpatient Prescriptions on File Prior to Visit  Medication Sig Dispense Refill  . cetirizine (ZYRTEC) 10 MG tablet Take 10 mg by mouth daily as needed for allergies.     . clonazePAM (KLONOPIN) 0.5 MG tablet Take 1 tablet (0.5 mg total) by mouth 2 (two) times daily as needed (anxiety). 10 tablet 0  . levothyroxine (SYNTHROID, LEVOTHROID) 100 MCG tablet Take 1 tablet (100 mcg total) by mouth daily before breakfast. 30 tablet 3  . Linaclotide (LINZESS) 145 MCG CAPS capsule Take 1 capsule (145 mcg total) by mouth daily. (Patient taking differently: Take 145 mcg by mouth daily as needed (constipation). ) 30 capsule 3  . lithium carbonate 300 MG capsule Take 1 capsule (300 mg total) by mouth 2 (two) times daily with a meal. 30 capsule 0  . omeprazole (PRILOSEC) 40 MG capsule TAKE ONE (1) CAPSULE EACH DAY (Patient taking differently: TAKE 40 mg by mouth daily if needed for acid reflux/heartburn) 30 capsule 11  . sertraline (ZOLOFT) 50 MG tablet Take 1 tablet (50 mg total) by mouth daily. 30 tablet 3  . SUMAtriptan (IMITREX) 25 MG tablet TAKE ONE TABLET BY MOUTH EVERY 2 HOURS AS NEEDED FOR MIGRAINE. MAY REPEAT IN 2 HOURS IF HEADACHE PERISITS OR RECURS. 10 tablet 2  . traZODone (DESYREL) 150 MG tablet Take 1 tablet (150 mg total) by mouth at bedtime. 30 tablet 0   No current facility-administered medications on file prior to visit.     Allergies  Allergen Reactions  . Penicillins Shortness Of Breath, Diarrhea and Nausea And Vomiting    Has patient had a PCN reaction causing immediate rash, facial/tongue/throat swelling, SOB or lightheadedness with hypotension: yes Has patient had a PCN reaction causing severe rash involving mucus membranes  or skin necrosis: no Has patient had a PCN reaction that required hospitalization no Has patient had a PCN reaction occurring within the last 10 years: about 10 years If all of the above answers are "NO", then may proceed with Cephalosporin use.   . Nsaids     Pt states she is not allergic   . Sulfa Antibiotics Nausea And Vomiting    Past Medical History:  Diagnosis Date  . Bleeding duodenal ulcer   . Depression   . GERD (gastroesophageal reflux disease)   . Hypothyroidism   . Kidney stones   . Migraine headache     Past Surgical History:  Procedure Laterality Date  . ABDOMINAL HYSTERECTOMY  2004   partial  . CHOLECYSTECTOMY    . TOOTH EXTRACTION      Family History  Problem Relation Age of Onset  . Cancer Mother   . Cancer Father   . Alcohol abuse Sister   . Bipolar disorder Sister   . Alcohol abuse Maternal Grandmother   . Stroke Maternal Grandmother   . Stroke Paternal Grandmother     Social History   Social History  . Marital status: Married    Spouse name: N/A  . Number of children: N/A  . Years of education: N/A   Occupational History  . Not on file.   Social History Main Topics  . Smoking status: Never Smoker  . Smokeless tobacco: Never Used  . Alcohol use 0.0 oz/week  .  Drug use: No  . Sexual activity: Yes    Birth control/ protection: None   Other Topics Concern  . Not on file   Social History Narrative  . No narrative on file   The PMH, PSH, Social History, Family History, Medications, and allergies have been reviewed in [facility], and have been updated if relevant.  OBJECTIVE: BP 118/70   Pulse 71   Temp 97.9 F (36.6 C) (Oral)   Wt 123 lb 8 oz (56 kg)   SpO2 96%   BMI 24.12 kg/m   She appears well, vital signs are as noted. Ears normal.  Throat and pharynx normal.  Neck supple. No adenopathy in the neck. Nose is congested. Sinuses tender. The chest is clear, without wheezes or rales.  ASSESSMENT:  sinusitis  PLAN: Given duration  and progression of symptoms, will treat for bacterial sinusitis.  Symptomatic therapy suggested: push fluids, rest and return office visit prn if symptoms persist or worsen.  Call or return to clinic prn if these symptoms worsen or fail to improve as anticipated.

## 2016-02-18 NOTE — Progress Notes (Signed)
Pre visit review using our clinic review tool, if applicable. No additional management support is needed unless otherwise documented below in the visit note. 

## 2016-02-18 NOTE — Addendum Note (Signed)
Addended by: Modena Nunnery on: 02/18/2016 09:38 AM   Modules accepted: Orders

## 2016-02-19 ENCOUNTER — Telehealth: Payer: Self-pay | Admitting: Family Medicine

## 2016-02-19 NOTE — Telephone Encounter (Signed)
[facility] Patient Name: Tanya Nguyen DOB: 1965/05/30 Initial Comment [name] states she was seen yesterday, rx zpack for sinus infection. Feels worse today, throat burns and feels like it swells. Also weak and coughing some. Nurse Assessment Nurse: Markus Daft, RN, Sherre Poot Date/Time (Eastern Time): 02/19/2016 11:10:55 AM Confirm and document reason for call. If symptomatic, describe symptoms. ---[name] states she was seen yesterday, given Z pack for sinus infection. Feels worse today, sore throat burns, and feels like it swells. Also, weak and mild dry cough. Severe chest pain. Got flu shot yesterday. Does the patient have any new or worsening symptoms? ---Yes Will a triage be completed? ---Yes Related visit to physician within the last 2 weeks? ---Yes Does the PT have any chronic conditions? (i.e. diabetes, asthma, etc.) ---Yes List chronic conditions. ---Depression, Hypothyroid, Is the patient pregnant or possibly pregnant? (Ask all females between the ages of 36-55) ---No Is this a behavioral health or substance abuse call? ---No Guidelines Guideline Title Affirmed Question Affirmed Notes Sinus Infection on Antibiotic Follow-up Call Patient sounds very sick or weak to the triager Sinus Infection on Antibiotic Follow-up Call [1] Taking antibiotic < 72 hours (3 days) AND [2] sinus pain not improved (all triage questions negative) Final Disposition User [place], RN, Sherre Poot Comments Then caller states that she does not have severe chest pain now after taking some meds, and now rates it at 2/10, off/on at night time most of the time. Referrals GO TO FACILITY REFUSED Disagree/Comply: Comply Disagree/Comply: Comply

## 2016-02-19 NOTE — Telephone Encounter (Signed)
PLEASE NOTE: All timestamps contained within this report are represented as [place] Standard Time. CONFIDENTIALTY NOTICE: This fax transmission is intended only for the addressee. It contains information that is legally privileged, confidential or otherwise protected from use or disclosure. If you are not the intended recipient, you are strictly prohibited from reviewing, disclosing, copying using or disseminating any of this information or taking any action in reliance on or regarding this information. If you have received this fax in error, please notify us immediately by telephone so that we can arrange for its return to [place]. Phone: [phone], Toll-Free: [phone], Fax: [phone] Page: 1 of 2 Call Id: [id number] [place] Patient Name: Tanya Nguyen Gender: Female DOB: [birth year] Age: 50 Y 48 M 24 D Return Phone Number: [phone] (Primary), [phone] (Secondary) Address: [address] City/State/Zip: [place] [address] Client [facility] - Client Client Site [facility] - Day Physician Arnette Norris - MD Contact Type Call Who Is Calling Patient / Member / Family / Caregiver Call Type Triage / Clinical Relationship To Patient Self Return Phone Number [phone] (Primary) Chief Complaint Weakness, Generalized Reason for Call Symptomatic / Request for [place] states she was seen yesterday, rx zpack for sinus infection. Feels worse today, throat burns and feels like it swells. Also weak and coughing some. Appointment Disposition EMR Appointment Not Necessary Info pasted into Epic Yes PreDisposition Did not know what to do Translation No Nurse Assessment Nurse: Markus Daft, RN, Sherre Poot Date/Time (Eastern Time): 02/19/2016 11:10:55 AM Confirm and document reason for call. If symptomatic, describe  symptoms. ---[name] states she was seen yesterday, given Z pack for sinus infection. Feels worse today, sore throat burns, and feels like it swells. Also, weak and mild dry cough. Severe chest pain. Got flu shot yesterday. Does the patient have any new or worsening symptoms? ---Yes Will a triage be completed? ---Yes Related visit to physician within the last 2 weeks? ---Yes Does the PT have any chronic conditions? (i.e. diabetes, asthma, etc.) ---Yes List chronic conditions. ---Depression, Hypothyroid, Is the patient pregnant or possibly pregnant? (Ask all females between the ages of 85-55) ---No Is this a behavioral health or substance abuse call? ---No Guidelines Guideline Title Affirmed Question Affirmed Notes Nurse Date/Time (Eastern Time) Sinus Infection on Antibiotic Follow-up Call Patient sounds very sick or weak to the triager Markus Daft, RN, [facility] 02/19/2016 11:15:46 AM Sinus Infection on Antibiotic Follow-up Call [1] Taking antibiotic < 72 hours (3 days) AND [2] Vivianne Master, Windy 02/19/2016 11:18:59 AM PLEASE NOTE: All timestamps contained within this report are represented as [place] Standard Time. CONFIDENTIALTY NOTICE: This fax transmission is intended only for the addressee. It contains information that is legally privileged, confidential or otherwise protected from use or disclosure. If you are not the intended recipient, you are strictly prohibited from reviewing, disclosing, copying using or disseminating any of this information or taking any action in reliance on or regarding this information. If you have received this fax in error, please notify us immediately by telephone so that we can arrange for its return to [place]. Phone: [phone], Toll-Free: [phone], Fax: [phone] Page: 2 of 2 Call Id: [id number] Guidelines Guideline Title Affirmed Question Affirmed Notes Nurse Date/Time Eilene Ghazi Time) sinus pain not improved (all triage  questions negative) Disp. Time Eilene Ghazi Time) Disposition Final User 02/19/2016 11:17:09 AM Go to ED Now (or PCP triage) Markus Daft, [place], [facility] 02/19/2016  11:20:53 Kennedy, RN, Sherre Poot [name] Understands: Yes Disagree/Comply: Personal assistant Understands: Yes Disagree/Comply: Comply Care Advice Given Per Guideline GO TO ED NOW (OR PCP TRIAGE): * IF NO PCP TRIAGE: You need to be seen. Go to the [facility] at [facility] within the next hour. Leave as soon as you can. DRIVING: Another adult should drive. BRING MEDICINES: * Please bring a list of your current medicines when you go to see the doctor. CARE ADVICE given per Sinus Infection on Antibiotic Follow-Up Call (Adult) guideline. HOME CARE: You should be able to treat this at home. REASSURANCE: * Most bacterial infections do not respond to the first dose of an antibiotic. * Often there is no improvement the first day. * People gradually get better over 2-3 days. ANTIBIOTIC: Continue taking the prescribed antibiotic. PAIN MEDICINES: * For pain relief, take acetaminophen, ibuprofen, or naproxen. * Before taking any medicine, read all the instructions on the package. FOR A STUFFY NOSE - USE NASAL WASHES: * Introduction: Saline (salt water) nasal irrigation (nasal wash) is an effective and simple home remedy for treating stuffy nose and sinus congestion. The nose can be irrigated by pouring, spraying, or squirting salt water into the nose and then letting it run back out. * How it Helps: The salt water rinses out excess mucus, washes out any irritants (dust, allergens) that might be present, and moistens the nasal cavity. * Methods: There are several ways to perform nasal irrigation. You can use a saline nasal spray bottle (available over-the-counter), a rubber ear syringe, a medical syringe without the needle, or a NETI POT. CALL BACK IF: * Difficulty breathing (and not relieved by cleaning out nose) * Fever lasts over 2 days on  antibiotics * Symptoms don't improve by day 4 on antibiotics * You become worse. CARE ADVICE given per Sinus Infection on Antibiotic Follow-Up Call (Adult) guideline. Comments User: Mayford Knife, RN Date/Time Eilene Ghazi Time): 02/19/2016 11:18:40 AM Then caller states that she does not have severe chest pain now after taking some meds, and now rates it at 2/10, off/on at night time most of the time. Referrals GO TO FACILITY REFUSED

## 2016-02-23 ENCOUNTER — Encounter: Payer: Self-pay | Admitting: Family Medicine

## 2016-02-23 ENCOUNTER — Ambulatory Visit (INDEPENDENT_AMBULATORY_CARE_PROVIDER_SITE_OTHER): Payer: BLUE CROSS/BLUE SHIELD | Admitting: Family Medicine

## 2016-02-23 DIAGNOSIS — R05 Cough: Secondary | ICD-10-CM

## 2016-02-23 DIAGNOSIS — R053 Chronic cough: Secondary | ICD-10-CM

## 2016-02-23 MED ORDER — HYDROCOD POLST-CPM POLST ER 10-8 MG/5ML PO SUER: 5.0000 mL | Freq: Every evening | ORAL | 0 refills | Status: DC | PRN

## 2016-02-23 NOTE — Assessment & Plan Note (Signed)
Lung exam reassuring.  Reassured pt.  Given rx for tussionex to take at bedtime for severe cough. Sedation precautions discussed.

## 2016-02-23 NOTE — Progress Notes (Signed)
Subjective:   Patient ID: Tanya Nguyen, female    DOB: 20-Oct-1965, 50 y.o.   MRN: [id number]  Abreana Nguyen is a pleasant 50 y.o. year old female who presents to clinic today with Follow-up (cough and congestion)  on 02/23/2016  HPI:  I saw her on 02/18/16 for URI symptoms.  Note reviewed.  Given duration and progression of symptoms, treated her with a zpack and tessalon perles.  She is PCN allergic.  Overall feeling better but still weak and cough is keeping her up at night.   Took the last dose of Zpack yesterday.  No fevers. Current Outpatient Prescriptions on File Prior to Visit  Medication Sig Dispense Refill  . cetirizine (ZYRTEC) 10 MG tablet Take 10 mg by mouth daily as needed for allergies.     . clonazePAM (KLONOPIN) 0.5 MG tablet Take 1 tablet (0.5 mg total) by mouth 2 (two) times daily as needed (anxiety). 10 tablet 0  . levothyroxine (SYNTHROID, LEVOTHROID) 100 MCG tablet Take 1 tablet (100 mcg total) by mouth daily before breakfast. 30 tablet 3  . Linaclotide (LINZESS) 145 MCG CAPS capsule Take 1 capsule (145 mcg total) by mouth daily. (Patient taking differently: Take 145 mcg by mouth daily as needed (constipation). ) 30 capsule 3  . lithium carbonate 300 MG capsule Take 1 capsule (300 mg total) by mouth 2 (two) times daily with a meal. 30 capsule 0  . omeprazole (PRILOSEC) 40 MG capsule TAKE ONE (1) CAPSULE EACH DAY (Patient taking differently: TAKE 40 mg by mouth daily if needed for acid reflux/heartburn) 30 capsule 11  . sertraline (ZOLOFT) 50 MG tablet Take 1 tablet (50 mg total) by mouth daily. 30 tablet 3  . SUMAtriptan (IMITREX) 25 MG tablet TAKE ONE TABLET BY MOUTH EVERY 2 HOURS AS NEEDED FOR MIGRAINE. MAY REPEAT IN 2 HOURS IF HEADACHE PERISITS OR RECURS. 10 tablet 2  . traZODone (DESYREL) 150 MG tablet Take 1 tablet (150 mg total) by mouth at bedtime. 30 tablet 0   No current facility-administered medications on file prior to visit.     Allergies  Allergen  Reactions  . Penicillins Shortness Of Breath, Diarrhea and Nausea And Vomiting    Has patient had a PCN reaction causing immediate rash, facial/tongue/throat swelling, SOB or lightheadedness with hypotension: yes Has patient had a PCN reaction causing severe rash involving mucus membranes or skin necrosis: no Has patient had a PCN reaction that required hospitalization no Has patient had a PCN reaction occurring within the last 10 years: about 10 years If all of the above answers are "NO", then may proceed with Cephalosporin use.   . Nsaids     Pt states she is not allergic   . Sulfa Antibiotics Nausea And Vomiting    Past Medical History:  Diagnosis Date  . Bleeding duodenal ulcer   . Depression   . GERD (gastroesophageal reflux disease)   . Hypothyroidism   . Kidney stones   . Migraine headache     Past Surgical History:  Procedure Laterality Date  . ABDOMINAL HYSTERECTOMY  2004   partial  . CHOLECYSTECTOMY    . TOOTH EXTRACTION      Family History  Problem Relation Age of Onset  . Cancer Mother   . Cancer Father   . Alcohol abuse Sister   . Bipolar disorder Sister   . Alcohol abuse Maternal Grandmother   . Stroke Maternal Grandmother   . Stroke Paternal Grandmother     Social History  Social History  . Marital status: Married    Spouse name: N/A  . Number of children: N/A  . Years of education: N/A   Occupational History  . Not on file.   Social History Main Topics  . Smoking status: Never Smoker  . Smokeless tobacco: Never Used  . Alcohol use 0.0 oz/week  . Drug use: No  . Sexual activity: Yes    Birth control/ protection: None   Other Topics Concern  . Not on file   Social History Narrative  . No narrative on file   The PMH, PSH, Social History, Family History, Medications, and allergies have been reviewed in [facility], and have been updated if relevant.   Review of Systems  Constitutional: Positive for fatigue. Negative for fever.  HENT:  Positive for congestion.   Respiratory: Positive for cough. Negative for chest tightness.   Cardiovascular: Negative.   Musculoskeletal: Negative.   Neurological: Negative.   Psychiatric/Behavioral: Negative.   All other systems reviewed and are negative.      Objective:    BP 108/60   Pulse 86   Temp 97.7 F (36.5 C) (Oral)   Wt 124 lb 8 oz (56.5 kg)   SpO2 99%   BMI 24.31 kg/m    Physical Exam  Constitutional: She is oriented to person, place, and time. She appears well-developed and well-nourished. No distress.  HENT:  Head: Normocephalic and atraumatic.  Eyes: Conjunctivae are normal.  Cardiovascular: Normal rate and regular rhythm.   Pulmonary/Chest: Effort normal and breath sounds normal. No respiratory distress. She has no wheezes. She has no rales.  Musculoskeletal: Normal range of motion.  Neurological: She is alert and oriented to person, place, and time. No cranial nerve deficit.  Skin: Skin is warm and dry. She is not diaphoretic.  Psychiatric: She has a normal mood and affect. Her behavior is normal. Judgment and thought content normal.  Nursing note and vitals reviewed.         Assessment & Plan:   Persistent cough No Follow-up on file.

## 2016-03-31 ENCOUNTER — Telehealth: Payer: Self-pay | Admitting: Family Medicine

## 2016-03-31 DIAGNOSIS — R928 Other abnormal and inconclusive findings on diagnostic imaging of breast: Secondary | ICD-10-CM

## 2016-03-31 NOTE — Telephone Encounter (Signed)
Pr needs order for diagnostic mammogram.  Pt did not go back 6 months after her original mammogram.   Pt goes to [facility]  Pt prefers lunchtime or a little before.  cb number for pt [phone]

## 2016-03-31 NOTE — Telephone Encounter (Signed)
Orders entered

## 2016-04-01 NOTE — Telephone Encounter (Signed)
Patient said she spoke to her insurance and they cover 70% of a diagnostic mammogram.  Patient said she's not working and she can't afford to have it done. Her insurance will only pay 100% for a screening mammogram.

## 2016-04-01 NOTE — Telephone Encounter (Signed)
Tanya Nguyen, please see below and advise on what I need to do at this point. Thanks!

## 2016-04-02 NOTE — Telephone Encounter (Signed)
See Tanya Nguyen's note.  I think this is what I ordered, right?

## 2016-04-02 NOTE — Telephone Encounter (Signed)
[facility] Radiologist needs a Bilateral diagnostic tomo mammogram with Left breast US and Right breast US order put in epic. Please route this to Orion Crook as I no longer schedule Mammograms. Patient never went back for the [place] so she is now overdue for her yearly and they will need to do it all  as Diagnostic.

## 2016-04-13 NOTE — Telephone Encounter (Signed)
Please order ultra sound left and right  img [id number] and img [phone] thanks

## 2016-04-16 NOTE — Telephone Encounter (Signed)
Dr Deborra Medina I spoke with to pt to schedule her diag mammogram and ultra sounds.  Pt stated she could not afford to go her insurance will only pay for screen.  Pt stated she is not working and taking care of her mom.  She stated she has insurance through her husband.  Pt refused to let me make appointment due to financial situation. I gave her [facility] financial assistance number [phone].  I also called [facility] to see if they have programs to help pt with mamograms. Lenna Sciara stated they have programs and gave me [phone].  I called Thanna back and gave her this number and told her to speak to sheena or christy.  She may have to leave a message and they would call her back.  I told Kerina I would call back next week to see if she is ready to schedule appointment.  I also stress how important it was for her to get her mammograms

## 2016-04-16 NOTE — Addendum Note (Signed)
Addended by: Lucille Passy on: 04/16/2016 11:08 AM   Modules accepted: Orders

## 2016-04-16 NOTE — Telephone Encounter (Signed)
Thank you so much, [name], for taking such great care of our patients as always.

## 2016-04-19 NOTE — Telephone Encounter (Signed)
Spoke with pt she stated she has not called the number I gave her on Friday [phone].  Pt stated she could not afford to have this done.  Again I explained to pt if she will call the number [phone] they have programs to help people that cannot afford to get there mammogram done.  I stress she needed to call to see if she qualifies for these programs.

## 2016-04-22 NOTE — Telephone Encounter (Signed)
Dr Deborra Medina I was getting ready to call pt to see if she called to get help paying for her mammogram.  It looks like she made her an appointment 05/12/16 with [facility]

## 2016-05-06 ENCOUNTER — Ambulatory Visit: Payer: Self-pay | Admitting: Family Medicine

## 2016-05-10 ENCOUNTER — Ambulatory Visit (INDEPENDENT_AMBULATORY_CARE_PROVIDER_SITE_OTHER): Payer: BLUE CROSS/BLUE SHIELD | Admitting: Family Medicine

## 2016-05-10 ENCOUNTER — Encounter (INDEPENDENT_AMBULATORY_CARE_PROVIDER_SITE_OTHER): Payer: Self-pay

## 2016-05-10 ENCOUNTER — Encounter: Payer: Self-pay | Admitting: Family Medicine

## 2016-05-10 VITALS — BP 108/62 | HR 92 | Wt 130.0 lb

## 2016-05-10 DIAGNOSIS — S39012A Strain of muscle, fascia and tendon of lower back, initial encounter: Secondary | ICD-10-CM

## 2016-05-10 MED ORDER — CYCLOBENZAPRINE HCL 5 MG PO TABS: 5.0000 mg | ORAL_TABLET | Freq: Three times a day (TID) | ORAL | 1 refills | Status: DC | PRN

## 2016-05-10 NOTE — Patient Instructions (Signed)
Great to see you.  Take flexeril 5 mg up to 3 times daily for muscle spasms- this can make you sleepy.  Try a heating pad or massage.

## 2016-05-10 NOTE — Progress Notes (Signed)
Pre visit review using our clinic review tool, if applicable. No additional management support is needed unless otherwise documented below in the visit note. 

## 2016-05-10 NOTE — Progress Notes (Signed)
SUBJECTIVE:  Tanya Nguyen is a 51 y.o. female who complains of an injury causing low back pain 1 week(s) ago. The pain is positional with bending or lifting, without radiation down the legs. Mechanism of injury: lifting her mom from the commode. Symptoms have been constant since that time. Prior history of back problems: previous herniated disc. There is no numbness in the legs.  Current Outpatient Prescriptions on File Prior to Visit  Medication Sig Dispense Refill  . cetirizine (ZYRTEC) 10 MG tablet Take 10 mg by mouth daily as needed for allergies.     . clonazePAM (KLONOPIN) 0.5 MG tablet Take 1 tablet (0.5 mg total) by mouth 2 (two) times daily as needed (anxiety). 10 tablet 0  . levothyroxine (SYNTHROID, LEVOTHROID) 100 MCG tablet Take 1 tablet (100 mcg total) by mouth daily before breakfast. 30 tablet 3  . Linaclotide (LINZESS) 145 MCG CAPS capsule Take 1 capsule (145 mcg total) by mouth daily. (Patient taking differently: Take 145 mcg by mouth daily as needed (constipation). ) 30 capsule 3  . lithium carbonate 300 MG capsule Take 1 capsule (300 mg total) by mouth 2 (two) times daily with a meal. 30 capsule 0  . omeprazole (PRILOSEC) 40 MG capsule TAKE ONE (1) CAPSULE EACH DAY (Patient taking differently: TAKE 40 mg by mouth daily if needed for acid reflux/heartburn) 30 capsule 11  . sertraline (ZOLOFT) 50 MG tablet Take 1 tablet (50 mg total) by mouth daily. 30 tablet 3  . SUMAtriptan (IMITREX) 25 MG tablet TAKE ONE TABLET BY MOUTH EVERY 2 HOURS AS NEEDED FOR MIGRAINE. MAY REPEAT IN 2 HOURS IF HEADACHE PERISITS OR RECURS. 10 tablet 2  . traZODone (DESYREL) 150 MG tablet Take 1 tablet (150 mg total) by mouth at bedtime. 30 tablet 0   No current facility-administered medications on file prior to visit.     Allergies  Allergen Reactions  . Penicillins Shortness Of Breath, Diarrhea and Nausea And Vomiting    Has patient had a PCN reaction causing immediate rash, facial/tongue/throat  swelling, SOB or lightheadedness with hypotension: yes Has patient had a PCN reaction causing severe rash involving mucus membranes or skin necrosis: no Has patient had a PCN reaction that required hospitalization no Has patient had a PCN reaction occurring within the last 10 years: about 10 years If all of the above answers are "NO", then may proceed with Cephalosporin use.   . Nsaids     Pt states she is not allergic   . Sulfa Antibiotics Nausea And Vomiting    Past Medical History:  Diagnosis Date  . Bleeding duodenal ulcer   . Depression   . GERD (gastroesophageal reflux disease)   . Hypothyroidism   . Kidney stones   . Migraine headache     Past Surgical History:  Procedure Laterality Date  . ABDOMINAL HYSTERECTOMY  2004   partial  . CHOLECYSTECTOMY    . TOOTH EXTRACTION      Family History  Problem Relation Age of Onset  . Cancer Mother   . Cancer Father   . Alcohol abuse Sister   . Bipolar disorder Sister   . Alcohol abuse Maternal Grandmother   . Stroke Maternal Grandmother   . Stroke Paternal Grandmother     Social History   Social History  . Marital status: Married    Spouse name: N/A  . Number of children: N/A  . Years of education: N/A   Occupational History  . Not on file.   Social History  Main Topics  . Smoking status: Never Smoker  . Smokeless tobacco: Never Used  . Alcohol use 0.0 oz/week  . Drug use: No  . Sexual activity: Yes    Birth control/ protection: None   Other Topics Concern  . Not on file   Social History Narrative  . No narrative on file   The PMH, PSH, Social History, Family History, Medications, and allergies have been reviewed in [facility], and have been updated if relevant.  OBJECTIVE: BP 108/62 (BP Location: Left Arm, Patient Position: Sitting, Cuff Size: Normal)   Pulse 92   Wt 130 lb (59 kg)   SpO2 98%   BMI 25.39 kg/m   Vital signs as noted above. Patient appears to be in mild to moderate pain, antalgic gait  noted. Lumbosacral spine area reveals no local tenderness or mass. Painful and reduced LS ROM noted. Straight leg raise is negative at 90 degrees on bilateral. DTR's, motor strength and sensation normal, including heel and toe gait.  Peripheral pulses are palpable. Lumbar spine X-Ray: not indicated.   ASSESSMENT:  lumbar strain  PLAN: For acute pain, rest, intermittent application of cold packs (later, may switch to heat, but do not sleep on heating pad), analgesics and muscle relaxants are recommended. Discussed longer term treatment plan of prn NSAID's and discussed a home back care exercise program with flexion exercise routine. Proper lifting with avoidance of heavy lifting discussed. Consider Physical Therapy and XRay studies if not improving. Call or return to clinic prn if these symptoms worsen or fail to improve as anticipated.

## 2016-05-12 ENCOUNTER — Ambulatory Visit
Admission: RE | Admit: 2016-05-12 | Discharge: 2016-05-12 | Disposition: A | Discharge status: Home / Self Care | Payer: BLUE CROSS/BLUE SHIELD | Source: Ambulatory Visit | Attending: Family Medicine | Admitting: Family Medicine

## 2016-05-12 DIAGNOSIS — R928 Other abnormal and inconclusive findings on diagnostic imaging of breast: Secondary | ICD-10-CM

## 2016-07-15 ENCOUNTER — Other Ambulatory Visit: Payer: Self-pay | Admitting: Family Medicine

## 2016-08-09 ENCOUNTER — Other Ambulatory Visit: Payer: Self-pay | Admitting: Family Medicine

## 2016-08-09 NOTE — Telephone Encounter (Signed)
Last refill 07/15/16 #10, last OV 05/10/16

## 2016-09-16 ENCOUNTER — Other Ambulatory Visit: Payer: Self-pay | Admitting: Family Medicine

## 2016-09-16 DIAGNOSIS — Z791 Long term (current) use of non-steroidal anti-inflammatories (NSAID): Secondary | ICD-10-CM

## 2016-09-16 DIAGNOSIS — Z0000 Encounter for general adult medical examination without abnormal findings: Secondary | ICD-10-CM | POA: Insufficient documentation

## 2016-09-16 DIAGNOSIS — Z01419 Encounter for gynecological examination (general) (routine) without abnormal findings: Secondary | ICD-10-CM

## 2016-09-16 HISTORY — DX: Long term (current) use of non-steroidal anti-inflammatories (nsaid): Z79.1

## 2016-09-20 ENCOUNTER — Ambulatory Visit (INDEPENDENT_AMBULATORY_CARE_PROVIDER_SITE_OTHER): Payer: BLUE CROSS/BLUE SHIELD | Admitting: Podiatry

## 2016-09-20 ENCOUNTER — Encounter: Payer: Self-pay | Admitting: Podiatry

## 2016-09-20 ENCOUNTER — Ambulatory Visit (INDEPENDENT_AMBULATORY_CARE_PROVIDER_SITE_OTHER): Payer: BLUE CROSS/BLUE SHIELD

## 2016-09-20 VITALS — BP 110/68 | HR 70 | Resp 16

## 2016-09-20 DIAGNOSIS — M79672 Pain in left foot: Secondary | ICD-10-CM

## 2016-09-20 DIAGNOSIS — M722 Plantar fascial fibromatosis: Secondary | ICD-10-CM | POA: Diagnosis not present

## 2016-09-20 DIAGNOSIS — M79671 Pain in right foot: Secondary | ICD-10-CM | POA: Diagnosis not present

## 2016-09-20 MED ORDER — MELOXICAM 15 MG PO TABS: 15.0000 mg | ORAL_TABLET | Freq: Every day | ORAL | 3 refills | Status: DC

## 2016-09-20 MED ORDER — METHYLPREDNISOLONE 4 MG PO TBPK: ORAL_TABLET | ORAL | 0 refills | Status: DC

## 2016-09-20 NOTE — Patient Instructions (Signed)

## 2016-09-20 NOTE — Progress Notes (Signed)
   Subjective:    Patient ID: Tanya Nguyen, female    DOB: Nov 12, 1965, 51 y.o.   MRN: [id number]  HPI: She presents today with a chief complaint of pain to the plantar bilateral heels. States that she's been having throbbing pain for the past several months mornings are particularly bad in the feet to rule read on the bottoms. She states that she has seen Dr. Caryl Comes years ago and was treated for following arches with orthotics that she can no longer wear stating that they're too uncomfortable. She states that she's also had injections.    Review of Systems  All other systems reviewed and are negative.      Objective:   Physical Exam: Vital signs are stable alert and oriented 3. Pulses are palpable. Neurologic sensorium is intact. Due to reflexes are intact. Muscle strength was 5 over 5 dorsiflexion plantar flexion and inversion everters all into the musculature is intact. Orthopedic evaluation was resulted was distal to the ankle full range of motion with crepitation. She has pain on palpation medial calcaneal tubercle of the bilateral heel. No open lesions or wounds are noted. Radiographs taken today demonstrate normal osseous architecture for this age of patient she does have soft tissue increase in density is plantar fascia insertion site on bilateral heels. This is consistent with plantar fasciitis.        Assessment & Plan:  Plantar fasciitis bilateral.  Plan: Discussed etiology pathology concerned over surgical therapies. At this point I injected her bilateral heels today start her on a Medrol Dosepak followed by meloxicam. Discussed appropriate shoe gear stretching exercises ice therapy she modifications dispensed a plantar fascia brace and the night splint. Follow up with her in 1 month.

## 2016-09-22 ENCOUNTER — Other Ambulatory Visit: Payer: BLUE CROSS/BLUE SHIELD

## 2016-09-24 ENCOUNTER — Other Ambulatory Visit (INDEPENDENT_AMBULATORY_CARE_PROVIDER_SITE_OTHER): Payer: BLUE CROSS/BLUE SHIELD

## 2016-09-24 DIAGNOSIS — Z01419 Encounter for gynecological examination (general) (routine) without abnormal findings: Secondary | ICD-10-CM

## 2016-09-24 LAB — LIPID PANEL
CHOLESTEROL: 216 mg/dL — AB (ref 0–200)
HDL: 73.4 mg/dL (ref >39.00)
LDL Cholesterol: 132 mg/dL — ABNORMAL HIGH (ref 0–99)
NonHDL: 142.81
TRIGLYCERIDES: 53 mg/dL (ref 0.0–149.0)
Total CHOL/HDL Ratio: 3
VLDL: 10.6 mg/dL (ref 0.0–40.0)

## 2016-09-24 LAB — COMPREHENSIVE METABOLIC PANEL WITH GFR
ALT: 7 U/L (ref 0–35)
AST: 17 U/L (ref 0–37)
Albumin: 4.4 g/dL (ref 3.5–5.2)
Alkaline Phosphatase: 58 U/L (ref 39–117)
BUN: 15 mg/dL (ref 6–23)
CO2: 29 meq/L (ref 19–32)
Calcium: 9.5 mg/dL (ref 8.4–10.5)
Chloride: 102 meq/L (ref 96–112)
Creatinine, Ser: 0.98 mg/dL (ref 0.40–1.20)
GFR: 63.34 mL/min (ref >60.00)
Glucose, Bld: 93 mg/dL (ref 70–99)
Potassium: 4.1 meq/L (ref 3.5–5.1)
Sodium: 138 meq/L (ref 135–145)
Total Bilirubin: 0.2 mg/dL (ref 0.2–1.2)
Total Protein: 7 g/dL (ref 6.0–8.3)

## 2016-09-24 LAB — CBC WITH DIFFERENTIAL/PLATELET
Basophils Absolute: 0 K/uL (ref 0.0–0.1)
Basophils Relative: 0.6 % (ref 0.0–3.0)
Eosinophils Absolute: 0.1 K/uL (ref 0.0–0.7)
Eosinophils Relative: 1.9 % (ref 0.0–5.0)
HCT: 40 % (ref 36.0–46.0)
Hemoglobin: 13 g/dL (ref 12.0–15.0)
Lymphocytes Relative: 17.6 % (ref 12.0–46.0)
Lymphs Abs: 1.3 K/uL (ref 0.7–4.0)
MCHC: 32.6 g/dL (ref 30.0–36.0)
MCV: 90.9 fl (ref 78.0–100.0)
Monocytes Absolute: 0.4 K/uL (ref 0.1–1.0)
Monocytes Relative: 4.9 % (ref 3.0–12.0)
Neutro Abs: 5.6 K/uL (ref 1.4–7.7)
Neutrophils Relative %: 75 % (ref 43.0–77.0)
Platelets: 304 K/uL (ref 150.0–400.0)
RBC: 4.4 Mil/uL (ref 3.87–5.11)
RDW: 13.9 % (ref 11.5–15.5)
WBC: 7.4 K/uL (ref 4.0–10.5)

## 2016-09-24 LAB — TSH: TSH: 42.09 u[IU]/mL — ABNORMAL HIGH (ref 0.35–4.50)

## 2016-09-27 ENCOUNTER — Other Ambulatory Visit (HOSPITAL_COMMUNITY)
Admission: RE | Admit: 2016-09-27 | Discharge: 2016-09-27 | Disposition: A | Discharge status: Home / Self Care | Payer: BLUE CROSS/BLUE SHIELD | Source: Ambulatory Visit | Attending: Family Medicine | Admitting: Family Medicine

## 2016-09-27 ENCOUNTER — Encounter: Payer: Self-pay | Admitting: Family Medicine

## 2016-09-27 ENCOUNTER — Ambulatory Visit (INDEPENDENT_AMBULATORY_CARE_PROVIDER_SITE_OTHER): Payer: BLUE CROSS/BLUE SHIELD | Admitting: Family Medicine

## 2016-09-27 VITALS — BP 100/80 | HR 78 | Ht 60.0 in | Wt 128.0 lb

## 2016-09-27 DIAGNOSIS — E038 Other specified hypothyroidism: Secondary | ICD-10-CM | POA: Diagnosis not present

## 2016-09-27 DIAGNOSIS — F3130 Bipolar disorder, current episode depressed, mild or moderate severity, unspecified: Secondary | ICD-10-CM | POA: Diagnosis not present

## 2016-09-27 DIAGNOSIS — Z124 Encounter for screening for malignant neoplasm of cervix: Secondary | ICD-10-CM | POA: Diagnosis not present

## 2016-09-27 DIAGNOSIS — Z01419 Encounter for gynecological examination (general) (routine) without abnormal findings: Secondary | ICD-10-CM | POA: Diagnosis not present

## 2016-09-27 NOTE — Assessment & Plan Note (Signed)
Reviewed preventive care protocols, scheduled due services, and updated immunizations Discussed nutrition, exercise, diet, and healthy lifestyle.  Pap smear done today since pt was not sure if she still had a cervix.

## 2016-09-27 NOTE — Assessment & Plan Note (Signed)
Deteriorated.  Has not been taking rx. Explained importance of taking it and how to take it properly. Will recheck labs in 4 weeks.

## 2016-09-27 NOTE — Assessment & Plan Note (Signed)
Well controlled on current rx. No changes made today. 

## 2016-09-27 NOTE — Progress Notes (Addendum)
Subjective:   Patient ID: Tanya Nguyen, female    DOB: 04/30/1965, 51 y.o.   MRN: [id number]  Tanya Nguyen is a pleasant 51 y.o. year old female who presents to clinic today with Annual Exam  on 09/27/2016  HPI: Remote h/o hysterectomy Mammogram 05/12/16  Colonoscopy 04/15/14  Hypothyroidism-had been euthyroid on synthroid 100 mcg daily but thyroid function is off this month. Admits to not taking synthroid in a while - "i just keep forgetting."  She has been very fatigued.  Lab Results  Component Value Date   TSH 42.09 (H) 09/24/2016    Depression/anxiety,  history of bipoloar disorder- feels improved controlled with current rxs- taking zoloft 50 mg daily, lithium and as needed klonipin. Current Outpatient Prescriptions on File Prior to Visit  Medication Sig Dispense Refill  . cetirizine (ZYRTEC) 10 MG tablet Take 10 mg by mouth daily as needed for allergies.     . clonazePAM (KLONOPIN) 0.5 MG tablet Take 1 tablet (0.5 mg total) by mouth 2 (two) times daily as needed (anxiety). 10 tablet 0  . cyclobenzaprine (FLEXERIL) 5 MG tablet Take 1 tablet (5 mg total) by mouth 3 (three) times daily as needed for muscle spasms. 30 tablet 1  . levothyroxine (SYNTHROID, LEVOTHROID) 100 MCG tablet Take 1 tablet (100 mcg total) by mouth daily before breakfast. NEEDS OFFICE VISIT 30 tablet 0  . Linaclotide (LINZESS) 145 MCG CAPS capsule Take 1 capsule (145 mcg total) by mouth daily. (Patient taking differently: Take 145 mcg by mouth daily as needed (constipation). ) 30 capsule 3  . lithium carbonate 300 MG capsule Take 1 capsule (300 mg total) by mouth 2 (two) times daily with a meal. 30 capsule 0  . meloxicam (MOBIC) 15 MG tablet Take 1 tablet (15 mg total) by mouth daily. 30 tablet 3  . methylPREDNISolone (MEDROL DOSEPAK) 4 MG TBPK tablet 6 day dose pack - take as directed 21 tablet 0  . omeprazole (PRILOSEC) 40 MG capsule TAKE ONE (1) CAPSULE EACH DAY (Patient taking differently: TAKE 40 mg by mouth  daily if needed for acid reflux/heartburn) 30 capsule 11  . sertraline (ZOLOFT) 50 MG tablet Take 1 tablet (50 mg total) by mouth daily. 30 tablet 3  . SUMAtriptan (IMITREX) 25 MG tablet TAKE 1 TABLET EVERY 2 HOURS AS NEEDED FOR MIGRAINE, MAY REPEAT IN 2 HOURS IF HEADACHE PERSISTS OR RECURS 10 tablet 0  . traZODone (DESYREL) 150 MG tablet Take 1 tablet (150 mg total) by mouth at bedtime. 30 tablet 0   No current facility-administered medications on file prior to visit.     Allergies  Allergen Reactions  . Penicillins Shortness Of Breath, Diarrhea and Nausea And Vomiting    Has patient had a PCN reaction causing immediate rash, facial/tongue/throat swelling, SOB or lightheadedness with hypotension: yes Has patient had a PCN reaction causing severe rash involving mucus membranes or skin necrosis: no Has patient had a PCN reaction that required hospitalization no Has patient had a PCN reaction occurring within the last 10 years: about 10 years If all of the above answers are "NO", then may proceed with Cephalosporin use.   . Nsaids     Pt states she is not allergic   . Sulfa Antibiotics Nausea And Vomiting    Past Medical History:  Diagnosis Date  . Bleeding duodenal ulcer   . Depression   . GERD (gastroesophageal reflux disease)   . Hypothyroidism   . Kidney stones   . Migraine headache  Past Surgical History:  Procedure Laterality Date  . ABDOMINAL HYSTERECTOMY  2004   partial  . CHOLECYSTECTOMY    . TOOTH EXTRACTION      Family History  Problem Relation Age of Onset  . Cancer Mother   . Breast cancer Mother 73  . Cancer Father   . Alcohol abuse Sister   . Bipolar disorder Sister   . Alcohol abuse Maternal Grandmother   . Stroke Maternal Grandmother   . Stroke Paternal Grandmother     Social History   Social History  . Marital status: Married    Spouse name: N/A  . Number of children: N/A  . Years of education: N/A   Occupational History  . Not on file.     Social History Main Topics  . Smoking status: Never Smoker  . Smokeless tobacco: Never Used  . Alcohol use 0.0 oz/week  . Drug use: No  . Sexual activity: Yes    Birth control/ protection: None   Other Topics Concern  . Not on file   Social History Narrative  . No narrative on file   The PMH, PSH, Social History, Family History, Medications, and allergies have been reviewed in [facility], and have been updated if relevant.   Review of Systems  Constitutional: Positive for fatigue.  HENT: Negative.   Eyes: Negative.   Respiratory: Negative.   Cardiovascular: Negative.   Gastrointestinal: Negative.   Endocrine: Negative.   Genitourinary: Negative.   Musculoskeletal: Negative.   Skin: Negative.   Allergic/Immunologic: Negative.   Neurological: Negative.   Hematological: Negative.   Psychiatric/Behavioral: Negative.   All other systems reviewed and are negative.      Objective:    BP 100/80   Pulse 78   Ht 5' (1.524 m)   Wt 128 lb (58.1 kg)   SpO2 98%   BMI 25.00 kg/m    Physical Exam  Wt Readings from Last 3 Encounters:  09/27/16 128 lb (58.1 kg)  05/10/16 130 lb (59 kg)  02/23/16 124 lb 8 oz (56.5 kg)    General:  Well-developed,well-nourished,in no acute distress; alert,appropriate and cooperative throughout examination Head:  normocephalic and atraumatic.   Eyes:  vision grossly intact, PERRL Ears:  R ear normal and L ear normal externally, TMs clear bilaterally Nose:  no external deformity.   Mouth:  good dentition.   Neck:  No deformities, masses, or tenderness noted. Breasts:  No mass, nodules, thickening, tenderness, bulging, retraction, inflamation, nipple discharge or skin changes noted.   Lungs:  Normal respiratory effort, chest expands symmetrically. Lungs are clear to auscultation, no crackles or wheezes. Heart:  Normal rate and regular rhythm. S1 and S2 normal without gallop, murmur, click, rub or other extra sounds. Abdomen:  Bowel sounds  positive,abdomen soft and non-tender without masses, organomegaly or hernias noted. Rectal:  no external abnormalities.   Genitalia:  Pelvic Exam:        External: normal female genitalia without lesions or masses        Vagina: normal without lesions or masses        Cervix: normal without lesions or masses        Adnexa: normal bimanual exam without masses or fullness        Uterus: normal by palpation        Pap smear: performed Msk:  No deformity or scoliosis noted of thoracic or lumbar spine.   Extremities:  No clubbing, cyanosis, edema, or deformity noted with normal full range of motion  of all joints.   Neurologic:  alert & oriented X3 and gait normal.   Skin:  Intact without suspicious lesions or rashes Cervical Nodes:  No lymphadenopathy noted Axillary Nodes:  No palpable lymphadenopathy Psych:  Cognition and judgment appear intact. Alert and cooperative with normal attention span and concentration. No apparent delusions, illusions, hallucinations     Assessment & Plan:   Well woman exam  Bipolar I disorder, most recent episode depressed ([place])  Other specified hypothyroidism - Plan: TSH, T4, Free No Follow-up on file.

## 2016-09-27 NOTE — Patient Instructions (Signed)
Great to see you. Please restart taking your synthroid every morning 30 minutes before food or other medications.  Please return in 4 weeks for labs.

## 2016-09-27 NOTE — Addendum Note (Signed)
Addended by: Mady Haagensen on: 09/27/2016 11:10 AM   Modules accepted: Orders

## 2016-09-29 ENCOUNTER — Other Ambulatory Visit: Payer: Self-pay | Admitting: Family Medicine

## 2016-09-29 LAB — CYTOLOGY - PAP
Diagnosis: NEGATIVE
HPV: NOT DETECTED

## 2016-10-21 ENCOUNTER — Encounter: Payer: Self-pay | Admitting: Emergency Medicine

## 2016-10-21 ENCOUNTER — Emergency Department
Admission: EM | Admit: 2016-10-21 | Discharge: 2016-10-21 | Disposition: A | Discharge status: Home / Self Care | Payer: BLUE CROSS/BLUE SHIELD | Attending: Emergency Medicine | Admitting: Emergency Medicine

## 2016-10-21 ENCOUNTER — Telehealth: Payer: Self-pay | Admitting: Family Medicine

## 2016-10-21 DIAGNOSIS — R1010 Upper abdominal pain, unspecified: Secondary | ICD-10-CM | POA: Diagnosis present

## 2016-10-21 DIAGNOSIS — E039 Hypothyroidism, unspecified: Secondary | ICD-10-CM | POA: Diagnosis not present

## 2016-10-21 DIAGNOSIS — R1013 Epigastric pain: Secondary | ICD-10-CM | POA: Diagnosis not present

## 2016-10-21 DIAGNOSIS — K2970 Gastritis, unspecified, without bleeding: Secondary | ICD-10-CM | POA: Diagnosis not present

## 2016-10-21 LAB — URINALYSIS, COMPLETE (UACMP) WITH MICROSCOPIC
BACTERIA UA: NONE SEEN
Bilirubin Urine: NEGATIVE
GLUCOSE, UA: NEGATIVE mg/dL
HGB URINE DIPSTICK: NEGATIVE
Ketones, ur: NEGATIVE mg/dL
LEUKOCYTES UA: NEGATIVE
NITRITE: NEGATIVE
PROTEIN: NEGATIVE mg/dL
SPECIFIC GRAVITY, URINE: 1.011 (ref 1.005–1.030)
pH: 5 (ref 5.0–8.0)

## 2016-10-21 LAB — COMPREHENSIVE METABOLIC PANEL
ALBUMIN: 3.9 g/dL (ref 3.5–5.0)
ALT: 9 U/L — ABNORMAL LOW (ref 14–54)
ANION GAP: 5 (ref 5–15)
AST: 20 U/L (ref 15–41)
Alkaline Phosphatase: 49 U/L (ref 38–126)
BILIRUBIN TOTAL: 0.4 mg/dL (ref 0.3–1.2)
BUN: 11 mg/dL (ref 6–20)
CO2: 27 mmol/L (ref 22–32)
Calcium: 9.1 mg/dL (ref 8.9–10.3)
Chloride: 108 mmol/L (ref 101–111)
Creatinine, Ser: 0.82 mg/dL (ref 0.44–1.00)
Glucose, Bld: 108 mg/dL — ABNORMAL HIGH (ref 65–99)
POTASSIUM: 4.1 mmol/L (ref 3.5–5.1)
Sodium: 140 mmol/L (ref 135–145)
TOTAL PROTEIN: 6.7 g/dL (ref 6.5–8.1)

## 2016-10-21 LAB — CBC
HEMATOCRIT: 39.7 % (ref 35.0–47.0)
Hemoglobin: 13.4 g/dL (ref 12.0–16.0)
MCH: 30.3 pg (ref 26.0–34.0)
MCHC: 33.7 g/dL (ref 32.0–36.0)
MCV: 89.9 fL (ref 80.0–100.0)
Platelets: 238 10*3/uL (ref 150–440)
RBC: 4.42 MIL/uL (ref 3.80–5.20)
RDW: 14.1 % (ref 11.5–14.5)
WBC: 5.8 10*3/uL (ref 3.6–11.0)

## 2016-10-21 LAB — LIPASE, BLOOD: Lipase: 29 U/L (ref 11–51)

## 2016-10-21 MED ORDER — ALUM & MAG HYDROXIDE-SIMETH 200-200-20 MG/5ML PO SUSP: 15.0000 mL | Freq: Four times a day (QID) | ORAL | 0 refills | Status: DC | PRN

## 2016-10-21 MED ORDER — GI COCKTAIL ~~LOC~~
30.0000 mL | Freq: Once | ORAL | Status: AC
  Administered 2016-10-21: 30 mL via ORAL
  Filled 2016-10-21: qty 30

## 2016-10-21 MED ORDER — ONDANSETRON 4 MG PO TBDP
4.0000 mg | ORAL_TABLET | Freq: Once | ORAL | Status: AC
  Administered 2016-10-21: 4 mg via ORAL
  Filled 2016-10-21: qty 1

## 2016-10-21 MED ORDER — PANTOPRAZOLE SODIUM 40 MG PO TBEC: 40.0000 mg | DELAYED_RELEASE_TABLET | Freq: Every day | ORAL | 1 refills | Status: DC

## 2016-10-21 NOTE — ED Notes (Signed)
First Nurse: pt sent over by pcp for further eval of abd pain.

## 2016-10-21 NOTE — Telephone Encounter (Signed)
[facility] Patient Name: LAKESHIA DOHNER DOB: Oct 21, 1965 Initial Comment [name] states she is having pain right under the breast, gets hard like a rock and hurts real bad on the inside, said she is miserable. She vomits a lot Nurse Assessment Nurse: Markus Daft, RN, Sherre Poot Date/Time (Eastern Time): 10/21/2016 9:23:32 AM Confirm and document reason for call. If symptomatic, describe symptoms. ---[name] states she is having severe pain right under both breast/ upper abdominal pain. Stomach and under breast gets hard like a rock and hurts real bad on the inside. She vomits a lot. Once last night. Twice the night before. Brownish color to it. - She's had these s/s for 6-7 months. She eats healthy. Does the patient have any new or worsening symptoms? ---Yes Will a triage be completed? ---Yes Related visit to physician within the last 2 weeks? ---No Does the PT have any chronic conditions? (i.e. diabetes, asthma, etc.) ---Yes List chronic conditions. ---BC (ASA) addict - h/o GI bleed in the past, bleeding ulcers Is the patient pregnant or possibly pregnant? (Ask all females between the ages of 2-55) ---No Is this a behavioral health or substance abuse call? ---No Guidelines Guideline Title Affirmed Question Affirmed Notes Chest Pain [1] Chest pain lasts > 5 minutes AND [2] described as crushing, pressure-like, or heavy Final Disposition User Call EMS 911 Now [place], RN, Sherre Poot Comments Like a pressure.  She understands the advice, but plans to have her husband take her to ED d/t cost of ambulance is too much. Disagree/Comply: Disagree Disagree/Comply Reason: Disagree with instructions

## 2016-10-21 NOTE — ED Triage Notes (Signed)
Pt reports epigastric pain and vomiting for six months. Pt reports last night pain was severe. Pt ambulatory to triage, no apparent distress noted.

## 2016-10-21 NOTE — ED Notes (Signed)
AAOx3.  Skin warm and dry.  NAD 

## 2016-10-21 NOTE — ED Provider Notes (Signed)
[facility] Emergency Department Provider Note  Time seen: 11:06 AM  I have reviewed the triage vital signs and the nursing notes.   HISTORY  Chief Complaint Abdominal Pain and Emesis    HPI Tanya Nguyen is a 51 y.o. female with a past medical history of gastric ulcers, gastric reflux, presents to the emergency department for upper abdominal discomfort. According to the patient for the past 6 months she has been experiencing near daily up her abdominal discomfort. States the discomfort is much worse if she eats spicy foods such as [place] food. States she avoids eating fatty foods. States her gallbladder was removed approximately 25 years ago. Denies any fever, states nausea with occasional vomiting especially after she eats by [employer]. Denies diarrhea. Denies any chest pain or shortness breath. Denies any dysuria or hematuria.  Past Medical History:  Diagnosis Date  . Bleeding duodenal ulcer   . Depression   . GERD (gastroesophageal reflux disease)   . Hypothyroidism   . Kidney stones   . Migraine headache     Patient Active Problem List   Diagnosis Date Noted  . Well woman exam 09/16/2016  . Persistent cough 02/23/2016  . Bipolar I disorder, most recent episode depressed ([place]) 03/01/2015  . PTSD (post-traumatic stress disorder) 02/27/2015  . Overuse of medication 05/09/2014  . Constipation 04/02/2014  . Fatigue 12/13/2013  . Insomnia 08/30/2013  . GERD (gastroesophageal reflux disease)   . Migraine headache   . Hypothyroidism   . Bleeding duodenal ulcer     Past Surgical History:  Procedure Laterality Date  . ABDOMINAL HYSTERECTOMY  2004   partial  . CHOLECYSTECTOMY    . TOOTH EXTRACTION        Allergies  Allergen Reactions  . Penicillins Shortness Of Breath, Diarrhea and Nausea And Vomiting    Has patient had a PCN reaction causing immediate rash, facial/tongue/throat swelling, SOB or lightheadedness with hypotension: yes Has patient  had a PCN reaction causing severe rash involving mucus membranes or skin necrosis: no Has patient had a PCN reaction that required hospitalization no Has patient had a PCN reaction occurring within the last 10 years: about 10 years If all of the above answers are "NO", then may proceed with Cephalosporin use.   . Nsaids     Pt states she is not allergic   . Sulfa Antibiotics Nausea And Vomiting    Family History  Problem Relation Age of Onset  . Cancer Mother   . Breast cancer Mother 14  . Cancer Father   . Alcohol abuse Sister   . Bipolar disorder Sister   . Alcohol abuse Maternal Grandmother   . Stroke Maternal Grandmother   . Stroke Paternal Grandmother     Social History Social History  Substance Use Topics  . Smoking status: Never Smoker  . Smokeless tobacco: Never Used  . Alcohol use 0.0 oz/week    Review of Systems Constitutional: Negative for fever. Cardiovascular: Negative for chest pain. Respiratory: Negative for shortness of breath. Gastrointestinal: Positive for epigastric discomfort. Positive for nausea with occasional vomiting. Negative for diarrhea. Genitourinary: Negative for dysuria. Musculoskeletal: Negative for back pain. Neurological: Negative for headache All other ROS negative  ____________________________________________   PHYSICAL EXAM:  VITAL SIGNS: ED Triage Vitals  Enc Vitals Group     BP 10/21/16 1035 131/79     Pulse Rate 10/21/16 1035 74     Resp 10/21/16 1035 18     Temp 10/21/16 1035 98.3 F (36.8 C)  Temp Source 10/21/16 1035 Oral     SpO2 10/21/16 1035 100 %     Weight 10/21/16 1035 126 lb (57.2 kg)     Height 10/21/16 1035 5' (1.524 m)     Head Circumference --      Peak Flow --      Pain Score 10/21/16 1034 5     Pain Loc --      Pain Edu? --      Excl. in [place]? --     Constitutional: Alert and oriented. Well appearing and in no distress. Eyes: Normal exam ENT   Head: Normocephalic and atraumatic.Marland Kitchen    Mouth/Throat: Mucous membranes are moist. Cardiovascular: Normal rate, regular rhythm. No murmur Respiratory: Normal respiratory effort without tachypnea nor retractions. Breath sounds are clear  Gastrointestinal: Soft and nontender. No distention.   Musculoskeletal: Nontender with normal range of motion in all extremities.  Neurologic:  Normal speech and language. No gross focal neurologic deficits  Skin:  Skin is warm, dry and intact.  Psychiatric: Mood and affect are normal.   ____________________________________________  EKG reviewed and interpreted by myself shows normal sinus rhythm at 76 bpm, narrow QRS, normal axis, normal intervals, no concerning ST changes.  INITIAL IMPRESSION / ASSESSMENT AND PLAN / ED COURSE  Pertinent labs & imaging results that were available during my care of the patient were reviewed by me and considered in my medical decision making (see chart for details).  Patient presents to the emergency department for upper abdominal discomfort 6 months, worse with a [employer]. Patient has a history of gastric ulcers to which this feels very similar. States it is been multiple years since her last endoscopy. Patient takes a PPI at home although states she often forgets to take his medication. Patient has a completely nontender exam currently. We'll check labs including LFTs and lipase. We will dose a GI cocktail and monitor for symptom improvement.  Patient's labs are largely within normal limits. LFTs are normal. Lipase is normal. Highly suspect gastritis/gastric ulcer disease. Patient's last documented heart rate is 40 however patient has maintained a heart rate in the 50s to 60s throughout her evaluation. Has no chest pain or shortness of breath. We will discharge the patient home with Protonix 40 mg daily for the next 2 months. I also discussed using liquid Maalox 3 times daily with meals for the next 7 days. Patient will follow-up with Dr. Vira Agar for further  workup.  ____________________________________________   FINAL CLINICAL IMPRESSION(S) / ED DIAGNOSES  Epigastric pain    Harvest Dark, MD 10/21/16 1315

## 2016-10-25 ENCOUNTER — Ambulatory Visit: Payer: BLUE CROSS/BLUE SHIELD | Admitting: Podiatry

## 2016-10-27 ENCOUNTER — Other Ambulatory Visit (INDEPENDENT_AMBULATORY_CARE_PROVIDER_SITE_OTHER): Payer: BLUE CROSS/BLUE SHIELD

## 2016-10-27 DIAGNOSIS — E038 Other specified hypothyroidism: Secondary | ICD-10-CM | POA: Diagnosis not present

## 2016-10-27 LAB — TSH: TSH: 1.43 u[IU]/mL (ref 0.35–4.50)

## 2016-10-27 LAB — T4, FREE: FREE T4: 1.02 ng/dL (ref 0.60–1.60)

## 2016-10-28 ENCOUNTER — Other Ambulatory Visit: Payer: BLUE CROSS/BLUE SHIELD

## 2016-11-03 ENCOUNTER — Ambulatory Visit (INDEPENDENT_AMBULATORY_CARE_PROVIDER_SITE_OTHER): Payer: BLUE CROSS/BLUE SHIELD | Admitting: Podiatry

## 2016-11-03 ENCOUNTER — Encounter: Payer: Self-pay | Admitting: Podiatry

## 2016-11-03 DIAGNOSIS — M722 Plantar fascial fibromatosis: Secondary | ICD-10-CM | POA: Diagnosis not present

## 2016-11-03 NOTE — Progress Notes (Signed)
She presents today for follow-up of her plantar fasciitis. She states that the injection seemed to help a little bit but they still really her in the burn. She states that they seem to hurt worse at night and return read on the bottoms.  Objective: Vital signs are stable she is alert and oriented 3 still has significant pain on palpation medial calcaneal tubercles bilateral. Radiographs taken were reviewed from last visit. Pulses are palpable no change in neurologic sensation.  Assessment: Plantar fasciitis and I do believe that she has a component of peripheral neuropathy as well.  Plan: At this point I reinjected her bilateral heels and she will be scheduled to follow-up with read for orthotics. She would like something flexible and less hard and the graphite ones that she had from a different clinic. Once the plantar fasciitis is resolved we will then consider treating neuropathy.

## 2016-11-10 ENCOUNTER — Encounter: Payer: Self-pay | Admitting: Family Medicine

## 2016-11-10 ENCOUNTER — Ambulatory Visit (INDEPENDENT_AMBULATORY_CARE_PROVIDER_SITE_OTHER): Payer: BLUE CROSS/BLUE SHIELD | Admitting: Family Medicine

## 2016-11-10 DIAGNOSIS — M722 Plantar fascial fibromatosis: Secondary | ICD-10-CM | POA: Diagnosis not present

## 2016-11-10 MED ORDER — LEVOTHYROXINE SODIUM 100 MCG PO TABS: ORAL_TABLET | ORAL | 11 refills | Status: DC

## 2016-11-10 NOTE — Patient Instructions (Signed)
Great to see you.  Please make an appointment with Dr. Lorelei Pont on your way out.

## 2016-11-10 NOTE — Assessment & Plan Note (Signed)
Persistent issue- does not want to see podiatry for this anymore. >15 minutes spent in face to face time with patient, >50% spent in counselling or coordination of care- showed Pam exercises/stretches from sports med advisor.  Aslo advised her to come here to see Dr. Lorelei Pont. The patient indicates understanding of these issues and agrees with the plan.

## 2016-11-10 NOTE — Progress Notes (Signed)
Subjective:   Patient ID: Tanya Nguyen, female    DOB: 1965-10-16, 51 y.o.   MRN: [id number]  Tanya Nguyen is a pleasant 51 y.o. year old female who presents to clinic today with Foot Problem  on 11/10/2016  HPI:  Has been seeing Dr. Milinda Pointer for plantar fascitis.  Last seen on 11/03/16.  Note reviewed.  Received bilateral heel injections and advised to follow up for orthotics.  Here today because she feels the injections only help for 24 hours or less and she cannot afford the orthotics he wants her to have.  Also expensive to see the podiatrist- has a higher copay for each visit.  Current Outpatient Prescriptions on File Prior to Visit  Medication Sig Dispense Refill  . alum & mag hydroxide-simeth (MAALOX REGULAR STRENGTH) 200-200-20 MG/5ML suspension Take 15 mLs by mouth every 6 (six) hours as needed for indigestion or heartburn. 355 mL 0  . cetirizine (ZYRTEC) 10 MG tablet Take 10 mg by mouth daily as needed for allergies.     . clonazePAM (KLONOPIN) 0.5 MG tablet Take 1 tablet (0.5 mg total) by mouth 2 (two) times daily as needed (anxiety). 10 tablet 0  . cyclobenzaprine (FLEXERIL) 5 MG tablet Take 1 tablet (5 mg total) by mouth 3 (three) times daily as needed for muscle spasms. 30 tablet 1  . Linaclotide (LINZESS) 145 MCG CAPS capsule Take 1 capsule (145 mcg total) by mouth daily. (Patient taking differently: Take 145 mcg by mouth daily as needed (constipation). ) 30 capsule 3  . lithium carbonate 300 MG capsule Take 1 capsule (300 mg total) by mouth 2 (two) times daily with a meal. 30 capsule 0  . meloxicam (MOBIC) 15 MG tablet Take 1 tablet (15 mg total) by mouth daily. 30 tablet 3  . omeprazole (PRILOSEC) 40 MG capsule TAKE ONE (1) CAPSULE EACH DAY (Patient taking differently: TAKE 40 mg by mouth daily if needed for acid reflux/heartburn) 30 capsule 11  . pantoprazole (PROTONIX) 40 MG tablet Take 1 tablet (40 mg total) by mouth daily. 30 tablet 1  . sertraline (ZOLOFT) 50 MG  tablet Take 1 tablet (50 mg total) by mouth daily. 30 tablet 3  . SUMAtriptan (IMITREX) 25 MG tablet TAKE 1 TABLET EVERY 2 HOURS AS NEEDED FOR MIGRAINE, MAY REPEAT IN 2 HOURS IF HEADACHE PERSISTS OR RECURS 10 tablet 0  . traZODone (DESYREL) 150 MG tablet Take 1 tablet (150 mg total) by mouth at bedtime. 30 tablet 0   No current facility-administered medications on file prior to visit.     Allergies  Allergen Reactions  . Penicillins Shortness Of Breath, Diarrhea and Nausea And Vomiting    Has patient had a PCN reaction causing immediate rash, facial/tongue/throat swelling, SOB or lightheadedness with hypotension: yes Has patient had a PCN reaction causing severe rash involving mucus membranes or skin necrosis: no Has patient had a PCN reaction that required hospitalization no Has patient had a PCN reaction occurring within the last 10 years: about 10 years If all of the above answers are "NO", then may proceed with Cephalosporin use.   . Nsaids     Pt states she is not allergic   . Sulfa Antibiotics Nausea And Vomiting    Past Medical History:  Diagnosis Date  . Bleeding duodenal ulcer   . Depression   . GERD (gastroesophageal reflux disease)   . Hypothyroidism   . Kidney stones   . Migraine headache     Past Surgical History:  Procedure  Laterality Date  . ABDOMINAL HYSTERECTOMY  2004   partial  . CHOLECYSTECTOMY    . TOOTH EXTRACTION      Family History  Problem Relation Age of Onset  . Cancer Mother   . Breast cancer Mother 41  . Cancer Father   . Alcohol abuse Sister   . Bipolar disorder Sister   . Alcohol abuse Maternal Grandmother   . Stroke Maternal Grandmother   . Stroke Paternal Grandmother     Social History   Social History  . Marital status: Married    Spouse name: N/A  . Number of children: N/A  . Years of education: N/A   Occupational History  . Not on file.   Social History Main Topics  . Smoking status: Never Smoker  . Smokeless tobacco:  Never Used  . Alcohol use 0.0 oz/week  . Drug use: No  . Sexual activity: Yes    Birth control/ protection: None   Other Topics Concern  . Not on file   Social History Narrative  . No narrative on file   The PMH, PSH, Social History, Family History, Medications, and allergies have been reviewed in [facility], and have been updated if relevant.   Review of Systems  Musculoskeletal:       +foot pain  Skin: Negative.   Neurological: Negative.   All other systems reviewed and are negative.      Objective:    BP 92/60   Pulse 91   Resp 16   Ht 5' (1.524 m)   Wt 122 lb 6.4 oz (55.5 kg)   SpO2 98%   BMI 23.90 kg/m   Wt Readings from Last 3 Encounters:  11/10/16 122 lb 6.4 oz (55.5 kg)  10/21/16 126 lb (57.2 kg)  09/27/16 128 lb (58.1 kg)     Physical Exam  Constitutional: She is oriented to person, place, and time. She appears well-developed. No distress.  HENT:  Head: Normocephalic and atraumatic.  Eyes: Conjunctivae are normal.  Cardiovascular: Normal rate.   Pulmonary/Chest: Effort normal.  Musculoskeletal: Normal range of motion.  Neurological: She is alert and oriented to person, place, and time. No cranial nerve deficit.  Skin: She is not diaphoretic.  Psychiatric: She has a normal mood and affect. Her behavior is normal. Judgment and thought content normal.  Nursing note reviewed.         Assessment & Plan:   Plantar fascial fibromatosis of both feet No Follow-up on file.

## 2016-11-17 ENCOUNTER — Other Ambulatory Visit: Payer: BLUE CROSS/BLUE SHIELD | Admitting: Orthotics

## 2016-11-17 ENCOUNTER — Ambulatory Visit: Payer: BLUE CROSS/BLUE SHIELD | Admitting: Podiatry

## 2016-11-30 ENCOUNTER — Ambulatory Visit (INDEPENDENT_AMBULATORY_CARE_PROVIDER_SITE_OTHER): Payer: BLUE CROSS/BLUE SHIELD | Admitting: Family Medicine

## 2016-11-30 ENCOUNTER — Encounter: Payer: Self-pay | Admitting: Family Medicine

## 2016-11-30 VITALS — BP 118/64 | HR 95 | Temp 97.6°F | Resp 16 | Wt 123.1 lb

## 2016-11-30 DIAGNOSIS — G43B0 Ophthalmoplegic migraine, not intractable: Secondary | ICD-10-CM

## 2016-11-30 MED ORDER — KETOROLAC TROMETHAMINE 60 MG/2ML IM SOLN
60.0000 mg | Freq: Once | INTRAMUSCULAR | Status: AC
  Administered 2016-11-30: 60 mg via INTRAMUSCULAR

## 2016-11-30 NOTE — Progress Notes (Signed)
Subjective:   Patient ID: Tanya Nguyen, female    DOB: May 07, 1965, 51 y.o.   MRN: [id number]  Tanya Nguyen is a pleasant 51 y.o. year old female who presents to clinic today with Headache  on 11/30/2016  HPI:  Imitrex has been working for her migraines but has one that started a few days ago that she "just can't break."  Associated with nausea, vomiting and photophobia.  No other focal neurological deficits.  Current Outpatient Prescriptions on File Prior to Visit  Medication Sig Dispense Refill  . alum & mag hydroxide-simeth (MAALOX REGULAR STRENGTH) 200-200-20 MG/5ML suspension Take 15 mLs by mouth every 6 (six) hours as needed for indigestion or heartburn. 355 mL 0  . cetirizine (ZYRTEC) 10 MG tablet Take 10 mg by mouth daily as needed for allergies.     . clonazePAM (KLONOPIN) 0.5 MG tablet Take 1 tablet (0.5 mg total) by mouth 2 (two) times daily as needed (anxiety). 10 tablet 0  . cyclobenzaprine (FLEXERIL) 5 MG tablet Take 1 tablet (5 mg total) by mouth 3 (three) times daily as needed for muscle spasms. 30 tablet 1  . levothyroxine (SYNTHROID, LEVOTHROID) 100 MCG tablet TAKE ONE TABLET DAILY BEFORE BREAKFAST. 30 tablet 11  . Linaclotide (LINZESS) 145 MCG CAPS capsule Take 1 capsule (145 mcg total) by mouth daily. (Patient taking differently: Take 145 mcg by mouth daily as needed (constipation). ) 30 capsule 3  . lithium carbonate 300 MG capsule Take 1 capsule (300 mg total) by mouth 2 (two) times daily with a meal. 30 capsule 0  . meloxicam (MOBIC) 15 MG tablet Take 1 tablet (15 mg total) by mouth daily. 30 tablet 3  . omeprazole (PRILOSEC) 40 MG capsule TAKE ONE (1) CAPSULE EACH DAY (Patient taking differently: TAKE 40 mg by mouth daily if needed for acid reflux/heartburn) 30 capsule 11  . pantoprazole (PROTONIX) 40 MG tablet Take 1 tablet (40 mg total) by mouth daily. 30 tablet 1  . sertraline (ZOLOFT) 50 MG tablet Take 1 tablet (50 mg total) by mouth daily. 30 tablet 3  .  SUMAtriptan (IMITREX) 25 MG tablet TAKE 1 TABLET EVERY 2 HOURS AS NEEDED FOR MIGRAINE, MAY REPEAT IN 2 HOURS IF HEADACHE PERSISTS OR RECURS 10 tablet 0  . traZODone (DESYREL) 150 MG tablet Take 1 tablet (150 mg total) by mouth at bedtime. 30 tablet 0   No current facility-administered medications on file prior to visit.     Allergies  Allergen Reactions  . Penicillins Shortness Of Breath, Diarrhea and Nausea And Vomiting    Has patient had a PCN reaction causing immediate rash, facial/tongue/throat swelling, SOB or lightheadedness with hypotension: yes Has patient had a PCN reaction causing severe rash involving mucus membranes or skin necrosis: no Has patient had a PCN reaction that required hospitalization no Has patient had a PCN reaction occurring within the last 10 years: about 10 years If all of the above answers are "NO", then may proceed with Cephalosporin use.   . Nsaids     Pt states she is not allergic   . Sulfa Antibiotics Nausea And Vomiting    Past Medical History:  Diagnosis Date  . Bleeding duodenal ulcer   . Depression   . GERD (gastroesophageal reflux disease)   . Hypothyroidism   . Kidney stones   . Migraine headache     Past Surgical History:  Procedure Laterality Date  . ABDOMINAL HYSTERECTOMY  2004   partial  . CHOLECYSTECTOMY    .  TOOTH EXTRACTION      Family History  Problem Relation Age of Onset  . Cancer Mother   . Breast cancer Mother 56  . Cancer Father   . Alcohol abuse Sister   . Bipolar disorder Sister   . Alcohol abuse Maternal Grandmother   . Stroke Maternal Grandmother   . Stroke Paternal Grandmother     Social History   Social History  . Marital status: Married    Spouse name: N/A  . Number of children: N/A  . Years of education: N/A   Occupational History  . Not on file.   Social History Main Topics  . Smoking status: Never Smoker  . Smokeless tobacco: Never Used  . Alcohol use 0.0 oz/week  . Drug use: No  . Sexual  activity: Yes    Birth control/ protection: None   Other Topics Concern  . Not on file   Social History Narrative  . No narrative on file   The PMH, PSH, Social History, Family History, Medications, and allergies have been reviewed in [facility], and have been updated if relevant.   Review of Systems  Eyes: Positive for photophobia.  Gastrointestinal: Positive for nausea and vomiting.  Neurological: Positive for headaches. Negative for dizziness, tremors, seizures, syncope, facial asymmetry, speech difficulty, weakness, light-headedness and numbness.  All other systems reviewed and are negative.      Objective:    BP 118/64 (BP Location: Left Arm, Patient Position: Sitting, Cuff Size: Normal)   Pulse 95   Temp 97.6 F (36.4 C) (Oral)   Resp 16   Wt 123 lb 2 oz (55.8 kg)   SpO2 98%   BMI 24.05 kg/m    Physical Exam   General:  Well-developed,well-nourished,in no acute distress; alert,appropriate and cooperative throughout examination Head:  normocephalic and atraumatic.   Eyes:  vision grossly intact, PERRL Msk:  No deformity or scoliosis noted of thoracic or lumbar spine.   Extremities:  No clubbing, cyanosis, edema, or deformity noted with normal full range of motion of all joints.   Neurologic:  alert & oriented X3 and gait normal.   Skin:  Intact without suspicious lesions or rashes Psych:  Cognition and judgment appear intact. Alert and cooperative with normal attention span and concentration. No apparent delusions, illusions, hallucinations       Assessment & Plan:   Ophthalmoplegic migraine, not intractable No Follow-up on file.

## 2016-11-30 NOTE — Assessment & Plan Note (Signed)
Intractable. IM toradol given in office today. Continue as needed imitrex. The patient indicates understanding of these issues and agrees with the plan.

## 2016-11-30 NOTE — Patient Instructions (Signed)
Great to see you. Thank you so much for my gifts. I love you and your mom so much and will miss you.

## 2016-11-30 NOTE — Addendum Note (Signed)
Addended by: Johna Sheriff on: 11/30/2016 12:17 PM   Modules accepted: Orders

## 2016-12-01 ENCOUNTER — Telehealth: Payer: Self-pay

## 2016-12-01 ENCOUNTER — Other Ambulatory Visit: Payer: Self-pay | Admitting: Family Medicine

## 2016-12-01 NOTE — Telephone Encounter (Signed)
Yes okay to try this.

## 2016-12-01 NOTE — Telephone Encounter (Signed)
All sleep aids have the potential to make you groggy the next day and the others are habit forming.  Has she tried OTC melatonin or benadryl?

## 2016-12-01 NOTE — Telephone Encounter (Signed)
Last refill 08/09/16  Last OV 11/30/16 Ok to refill?

## 2016-12-01 NOTE — Telephone Encounter (Signed)
Pt left v/m; pt seen 11/30/16. Pt thinks the shot she got on 11/30/16 might be causing diarrhea. Pt wants to know if there is a different sleep aid pt could take other than the trazodone which makes pt feel "druggy the next day."[facility] [address]. Pt request cb.

## 2016-12-01 NOTE — Telephone Encounter (Signed)
Pt called back and left v/m; pt is going to try taking Trazodone 150 mg tab and cutting pill in half because the Trazodone does help pt sleep and maybe cutting in half will still help pt sleep and not make pt groggy. Pt wants to make sure OK with Dr Deborra Medina. Pt request cb.

## 2016-12-02 NOTE — Telephone Encounter (Signed)
Pt was notified.  

## 2016-12-08 ENCOUNTER — Ambulatory Visit: Payer: Self-pay | Admitting: Family Medicine

## 2016-12-24 ENCOUNTER — Encounter: Payer: Self-pay | Admitting: Primary Care

## 2016-12-24 ENCOUNTER — Ambulatory Visit (INDEPENDENT_AMBULATORY_CARE_PROVIDER_SITE_OTHER): Payer: BLUE CROSS/BLUE SHIELD | Admitting: Primary Care

## 2016-12-24 VITALS — BP 118/64 | HR 78 | Temp 98.3°F

## 2016-12-24 DIAGNOSIS — G4700 Insomnia, unspecified: Secondary | ICD-10-CM

## 2016-12-24 DIAGNOSIS — F4310 Post-traumatic stress disorder, unspecified: Secondary | ICD-10-CM | POA: Diagnosis not present

## 2016-12-24 DIAGNOSIS — M722 Plantar fascial fibromatosis: Secondary | ICD-10-CM | POA: Diagnosis not present

## 2016-12-24 DIAGNOSIS — K219 Gastro-esophageal reflux disease without esophagitis: Secondary | ICD-10-CM | POA: Diagnosis not present

## 2016-12-24 DIAGNOSIS — Z23 Encounter for immunization: Secondary | ICD-10-CM | POA: Diagnosis not present

## 2016-12-24 DIAGNOSIS — R8290 Unspecified abnormal findings in urine: Secondary | ICD-10-CM

## 2016-12-24 DIAGNOSIS — G43B0 Ophthalmoplegic migraine, not intractable: Secondary | ICD-10-CM

## 2016-12-24 DIAGNOSIS — K5900 Constipation, unspecified: Secondary | ICD-10-CM | POA: Diagnosis not present

## 2016-12-24 DIAGNOSIS — F3130 Bipolar disorder, current episode depressed, mild or moderate severity, unspecified: Secondary | ICD-10-CM | POA: Diagnosis not present

## 2016-12-24 DIAGNOSIS — E038 Other specified hypothyroidism: Secondary | ICD-10-CM

## 2016-12-24 LAB — POC URINALSYSI DIPSTICK (AUTOMATED)
BILIRUBIN UA: NEGATIVE
Blood, UA: NEGATIVE
Glucose, UA: NEGATIVE
KETONES UA: NEGATIVE
LEUKOCYTES UA: NEGATIVE
Nitrite, UA: NEGATIVE
PH UA: 6 (ref 5.0–8.0)
Protein, UA: NEGATIVE
Spec Grav, UA: 1.025 (ref 1.010–1.025)
Urobilinogen, UA: 0.2 E.U./dL

## 2016-12-24 MED ORDER — KETOROLAC TROMETHAMINE 60 MG/2ML IM SOLN
60.0000 mg | Freq: Once | INTRAMUSCULAR | Status: AC
  Administered 2016-12-24: 60 mg via INTRAMUSCULAR

## 2016-12-24 NOTE — Assessment & Plan Note (Signed)
Improved. She is considering seeing her podiatrist for insoles.

## 2016-12-24 NOTE — Assessment & Plan Note (Signed)
Doing well on pantoprazole 40 mg, continue same.

## 2016-12-24 NOTE — Progress Notes (Signed)
Subjective:    Patient ID: Tanya Nguyen, female    DOB: 10/24/1965, 51 y.o.   MRN: [id number]  HPI  Tanya Nguyen is a 51 year old female who presents today to transfer care from Dr. Deborra Medina.  1) Bipolar Disorder: Diagnosed one year ago. Currently managed on clonazepam 0.5 mg, lithium 300 mg, sertraline 50 mg, and trazodone 150 mg. Currently following at [facility] for psychiatry and therapy treatment. Her last appointment was in early October.  2) GERD: Currently managed on pantoprazole 40 mg. She will experience symptoms of esophageal burning, epigastric pain, and gastric ulcers. She feels well managed on her current regimen.   3) Migraines: Diagnosed years ago. Currently managed on Sumatriptan 25 mg PRN (takes 1-3 times monthly on average). She does take BC tablets and Excedrin Migraine PRN for headaches. She endorses a headache that began yesterday and is located to the right frontal and parietal lobe. She took one BC powder today without much improvement. She think the headache is turning into a migraine. She is under a lot of stress as she's caring for her elderly mother.  4) Chronic Constipation: Currently managed on Linzess 145 mg for which she takes once to twice weekly on average. She feels well managed on her current dose.   5) Hypothyroidism: Currently managed on levothyroxine 100 mcg. Her last TSH was 1.43 in August 2018 which was a decrease from 20 in July 2018. She's feeling better overall and endorses compliance to her levothyroxine.  6) Chronic Back Pain: Intermittent. Uses cyclobenzaprine sparingly for intermittent muscle spasms with improvement.   Review of Systems  Respiratory: Negative for shortness of breath.   Cardiovascular: Negative for chest pain.  Gastrointestinal:       GERD Chronic constipation  Musculoskeletal:       Intermittent chronic back pain, no recent pain  Neurological: Positive for headaches.  Psychiatric/Behavioral:       See HPI       Past  Medical History:  Diagnosis Date  . Bleeding duodenal ulcer   . Depression   . GERD (gastroesophageal reflux disease)   . Hypothyroidism   . Kidney stones   . Migraine headache      Social History   Social History  . Marital status: Married    Spouse name: N/A  . Number of children: N/A  . Years of education: N/A   Occupational History  . Not on file.   Social History Main Topics  . Smoking status: Never Smoker  . Smokeless tobacco: Never Used  . Alcohol use 0.0 oz/week  . Drug use: No  . Sexual activity: Yes    Birth control/ protection: None   Other Topics Concern  . Not on file   Social History Narrative  . No narrative on file    Past Surgical History:  Procedure Laterality Date  . ABDOMINAL HYSTERECTOMY  2004   partial  . CHOLECYSTECTOMY    . TOOTH EXTRACTION      Family History  Problem Relation Age of Onset  . Cancer Mother   . Breast cancer Mother 55  . Cancer Father   . Alcohol abuse Sister   . Bipolar disorder Sister   . Alcohol abuse Maternal Grandmother   . Stroke Maternal Grandmother   . Stroke Paternal Grandmother     Allergies  Allergen Reactions  . Penicillins Shortness Of Breath, Diarrhea and Nausea And Vomiting    Has patient had a PCN reaction causing immediate rash, facial/tongue/throat swelling, SOB  or lightheadedness with hypotension: yes Has patient had a PCN reaction causing severe rash involving mucus membranes or skin necrosis: no Has patient had a PCN reaction that required hospitalization no Has patient had a PCN reaction occurring within the last 10 years: about 10 years If all of the above answers are "NO", then may proceed with Cephalosporin use.   . Nsaids     Pt states she is not allergic   . Sulfa Antibiotics Nausea And Vomiting    Current Outpatient Prescriptions on File Prior to Visit  Medication Sig Dispense Refill  . alum & mag hydroxide-simeth (MAALOX REGULAR STRENGTH) 200-200-20 MG/5ML suspension Take 15  mLs by mouth every 6 (six) hours as needed for indigestion or heartburn. 355 mL 0  . cetirizine (ZYRTEC) 10 MG tablet Take 10 mg by mouth daily as needed for allergies.     . clonazePAM (KLONOPIN) 0.5 MG tablet Take 1 tablet (0.5 mg total) by mouth 2 (two) times daily as needed (anxiety). 10 tablet 0  . cyclobenzaprine (FLEXERIL) 5 MG tablet Take 1 tablet (5 mg total) by mouth 3 (three) times daily as needed for muscle spasms. 30 tablet 1  . levothyroxine (SYNTHROID, LEVOTHROID) 100 MCG tablet TAKE ONE TABLET DAILY BEFORE BREAKFAST. 30 tablet 11  . lithium carbonate 300 MG capsule Take 1 capsule (300 mg total) by mouth 2 (two) times daily with a meal. 30 capsule 0  . pantoprazole (PROTONIX) 40 MG tablet Take 1 tablet (40 mg total) by mouth daily. 30 tablet 1  . sertraline (ZOLOFT) 50 MG tablet Take 1 tablet (50 mg total) by mouth daily. 30 tablet 3  . SUMAtriptan (IMITREX) 25 MG tablet TAKE 1 TABLET EVERY 2 HOURS AS NEEDED FOR MIGRAINE, MAY REPEAT IN 2 HOURS IF HEADACHE PERSISTS OR RECURS 10 tablet 3  . traZODone (DESYREL) 150 MG tablet Take 1 tablet (150 mg total) by mouth at bedtime. 30 tablet 0  . Linaclotide (LINZESS) 145 MCG CAPS capsule Take 1 capsule (145 mcg total) by mouth daily. (Patient not taking: Reported on 12/24/2016) 30 capsule 3   No current facility-administered medications on file prior to visit.     BP 118/64   Pulse 78   Temp 98.3 F (36.8 C) (Oral)   SpO2 98%    Objective:   Physical Exam  Constitutional: She appears well-nourished.  Neck: Neck supple.  Cardiovascular: Normal rate and regular rhythm.   Pulmonary/Chest: Effort normal and breath sounds normal.  Skin: Skin is warm and dry.  Psychiatric: She has a normal mood and affect.          Assessment & Plan:

## 2016-12-24 NOTE — Assessment & Plan Note (Signed)
Managed on Linzess PRN, takes once to twice weekly on average. Feels well managed, continue same.

## 2016-12-24 NOTE — Patient Instructions (Signed)
Continue following with the psychiatrist.   Follow up in July 2019 for your annual physical exam.  It was a pleasure meeting you!

## 2016-12-24 NOTE — Assessment & Plan Note (Signed)
Using Imitrex 1-3 times monthly on average.  Migraine present today. IM toradol provided. Discussed stress reduction techniques, avoid BC powder given history of gastric ulcers. Continue PPI.

## 2016-12-24 NOTE — Assessment & Plan Note (Signed)
Following with psychiatry, feels well managed. 

## 2016-12-24 NOTE — Assessment & Plan Note (Signed)
Recent repeat TSH stable. Continue levothyroxine 100 mcg.

## 2016-12-24 NOTE — Assessment & Plan Note (Signed)
Stable on current regimen, following with psychiatry and therapy. Continue current regimen.

## 2016-12-24 NOTE — Assessment & Plan Note (Signed)
Doing well on trazodone, following with psychiatry.

## 2016-12-28 ENCOUNTER — Ambulatory Visit (INDEPENDENT_AMBULATORY_CARE_PROVIDER_SITE_OTHER): Payer: BLUE CROSS/BLUE SHIELD | Admitting: Primary Care

## 2016-12-28 ENCOUNTER — Encounter: Payer: Self-pay | Admitting: Primary Care

## 2016-12-28 DIAGNOSIS — R8290 Unspecified abnormal findings in urine: Secondary | ICD-10-CM

## 2016-12-28 LAB — POC URINALSYSI DIPSTICK (AUTOMATED)
Bilirubin, UA: NEGATIVE
Glucose, UA: NEGATIVE
KETONES UA: NEGATIVE
Leukocytes, UA: NEGATIVE
Nitrite, UA: NEGATIVE
PH UA: 6 (ref 5.0–8.0)
PROTEIN UA: NEGATIVE
RBC UA: NEGATIVE
Urobilinogen, UA: NEGATIVE E.U./dL — AB

## 2016-12-28 NOTE — Progress Notes (Signed)
Subjective:    Patient ID: Tanya Nguyen, female    DOB: [birth year], 51 y.o.   MRN: [id number]  HPI  [name] is a 51 year old female who presents today with a chief complaint of foul smelling urine. She denies hematuria, dysuria, pelvic pain, vaginal itching, vaginal discharge. Her foul smelling urine has been constant for the past one year. She wears cotton panties. She uses mild unscented Dove body wash. She drinks little water daily.  She was in our office one week ago with a complaint of foul smelling urine. UA was negative for leuks, nitrites, blood, glucose.   Review of Systems  Constitutional: Negative for fever.  Gastrointestinal: Negative for abdominal pain and nausea.  Genitourinary: Negative for dysuria, frequency, pelvic pain and vaginal discharge.       Past Medical History:  Diagnosis Date  . Bleeding duodenal ulcer   . Depression   . GERD (gastroesophageal reflux disease)   . Hypothyroidism   . Kidney stones   . Migraine headache      Social History   Social History  . Marital status: Married    Spouse name: N/A  . Number of children: N/A  . Years of education: N/A   Occupational History  . Not on file.   Social History Main Topics  . Smoking status: Never Smoker  . Smokeless tobacco: Never Used  . Alcohol use 0.0 oz/week  . Drug use: No  . Sexual activity: Yes    Birth control/ protection: None   Other Topics Concern  . Not on file   Social History Narrative  . No narrative on file    Past Surgical History:  Procedure Laterality Date  . ABDOMINAL HYSTERECTOMY  2004   partial  . CHOLECYSTECTOMY    . TOOTH EXTRACTION      Family History  Problem Relation Age of Onset  . Cancer Mother   . Breast cancer Mother 88  . Cancer Father   . Alcohol abuse Sister   . Bipolar disorder Sister   . Alcohol abuse Maternal Grandmother   . Stroke Maternal Grandmother   . Stroke Paternal Grandmother     Allergies  Allergen Reactions  .  Penicillins Shortness Of Breath, Diarrhea and Nausea And Vomiting    Has patient had a PCN reaction causing immediate rash, facial/tongue/throat swelling, SOB or lightheadedness with hypotension: yes Has patient had a PCN reaction causing severe rash involving mucus membranes or skin necrosis: no Has patient had a PCN reaction that required hospitalization no Has patient had a PCN reaction occurring within the last 10 years: about 10 years If all of the above answers are "NO", then may proceed with Cephalosporin use.   . Nsaids     Pt states she is not allergic   . Sulfa Antibiotics Nausea And Vomiting    Current Outpatient Prescriptions on File Prior to Visit  Medication Sig Dispense Refill  . alum & mag hydroxide-simeth (MAALOX REGULAR STRENGTH) 200-200-20 MG/5ML suspension Take 15 mLs by mouth every 6 (six) hours as needed for indigestion or heartburn. 355 mL 0  . cetirizine (ZYRTEC) 10 MG tablet Take 10 mg by mouth daily as needed for allergies.     . clonazePAM (KLONOPIN) 0.5 MG tablet Take 1 tablet (0.5 mg total) by mouth 2 (two) times daily as needed (anxiety). 10 tablet 0  . cyclobenzaprine (FLEXERIL) 5 MG tablet Take 1 tablet (5 mg total) by mouth 3 (three) times daily as needed for muscle spasms.  30 tablet 1  . levothyroxine (SYNTHROID, LEVOTHROID) 100 MCG tablet TAKE ONE TABLET DAILY BEFORE BREAKFAST. 30 tablet 11  . lithium carbonate 300 MG capsule Take 1 capsule (300 mg total) by mouth 2 (two) times daily with a meal. 30 capsule 0  . pantoprazole (PROTONIX) 40 MG tablet Take 1 tablet (40 mg total) by mouth daily. 30 tablet 1  . sertraline (ZOLOFT) 50 MG tablet Take 1 tablet (50 mg total) by mouth daily. 30 tablet 3  . SUMAtriptan (IMITREX) 25 MG tablet TAKE 1 TABLET EVERY 2 HOURS AS NEEDED FOR MIGRAINE, MAY REPEAT IN 2 HOURS IF HEADACHE PERSISTS OR RECURS 10 tablet 3  . traZODone (DESYREL) 150 MG tablet Take 1 tablet (150 mg total) by mouth at bedtime. 30 tablet 0  . Linaclotide  (LINZESS) 145 MCG CAPS capsule Take 1 capsule (145 mcg total) by mouth daily. (Patient not taking: Reported on 12/24/2016) 30 capsule 3   No current facility-administered medications on file prior to visit.     BP 116/64   Pulse 68   Temp 98.4 F (36.9 C) (Oral)   Ht 5' (1.524 m)   Wt 126 lb (57.2 kg)   SpO2 98%   BMI 24.61 kg/m    Objective:   Physical Exam  Constitutional: She appears well-nourished.  Neck: Neck supple.  Cardiovascular: Normal rate and regular rhythm.   Pulmonary/Chest: Effort normal and breath sounds normal.  Abdominal: Soft. Bowel sounds are normal. There is no tenderness. There is no CVA tenderness.  Skin: Skin is warm and dry.          Assessment & Plan:

## 2016-12-28 NOTE — Patient Instructions (Signed)
Ensure you are consuming 64 ounces of water daily.  Avoid tight underwear, panty hose, tights.  Please notify us if you develop any vaginal symptoms, burning with urination, blood in your urine.  It was a pleasure to see you today!

## 2016-12-28 NOTE — Assessment & Plan Note (Signed)
Present for the past 1 year. UA today unremarkable. No vaginal symptoms.  Will have her increase water, continue to mild soaps, limit tight underwear.  Consider GYN vs urology evaluation.

## 2016-12-28 NOTE — Addendum Note (Signed)
Addended by: Jacqualin Combes on: 12/28/2016 01:04 PM   Modules accepted: Orders

## 2017-02-24 ENCOUNTER — Ambulatory Visit: Payer: Self-pay | Admitting: Internal Medicine

## 2017-02-24 ENCOUNTER — Ambulatory Visit: Payer: Self-pay | Admitting: *Deleted

## 2017-02-24 NOTE — Telephone Encounter (Signed)
Valli Glance RN also noted; Patient going to UC for headache/stomach concerns

## 2017-02-24 NOTE — Telephone Encounter (Signed)
Patient has had a migraine since last night. She tried to take her headache medication- but she vomited and she doesn't think that it stayed down long enough.  Reason for Disposition . [1] SEVERE headache (e.g., excruciating) AND [2] not improved after 2 hours of pain medicine . [1] MILD pain (e.g., does not interfere with normal activities) AND [2] comes and goes (cramps) AND [3] present > 72 hours  Answer Assessment - Initial Assessment Questions 1. LOCATION: "Where does it hurt?"      Right side of head 2. ONSET: "When did the headache start?" (Minutes, hours or days)      Last night 3. PATTERN: "Does the pain come and go, or has it been constant since it started?"     constant 4. SEVERITY: "How bad is the pain?" and "What does it keep you from doing?"  (e.g., Scale 1-10; mild, moderate, or severe)   - MILD (1-3): doesn't interfere with normal activities    - MODERATE (4-7): interferes with normal activities or awakens from sleep    - SEVERE (8-10): excruciating pain, unable to do any normal activities        Severe- patient took headache pill and it didn't help 5. RECURRENT SYMPTOM: "Have you ever had headaches before?" If so, ask: "When was the last time?" and "What happened that time?"      Hx of migraine-  At least 1 month- patient got injection 6. CAUSE: "What do you think is causing the headache?"     Migraine history  7. MIGRAINE: "Have you been diagnosed with migraine headaches?" If so, ask: "Is this headache similar?"      Yes- yes 8. HEAD INJURY: "Has there been any recent injury to the head?"      no 9. OTHER SYMPTOMS: "Do you have any other symptoms?" (fever, stiff neck, eye pain, sore throat, cold symptoms)     Weakness with headache 10. PREGNANCY: "Is there any chance you are pregnant?" "When was your last menstrual period?"       n/a  Answer Assessment - Initial Assessment Questions 1. LOCATION: "Where does it hurt?"      Pain is right under the breast in the  middle 2. RADIATION: "Does the pain shoot anywhere else?" (e.g., chest, back)     Last night she had pain in her back- but she was throwing up 3. ONSET: "When did the pain begin?" (e.g., minutes, hours or days ago)      Last night after she ate 4. SUDDEN: "Gradual or sudden onset?"     Gradual- that got worse 5. PATTERN "Does the pain come and go, or is it constant?"    - If constant: "Is it getting better, staying the same, or worsening?"      (Note: Constant means the pain never goes away completely; most serious pain is constant and it progresses)     - If intermittent: "How long does it last?" "Do you have pain now?"     (Note: Intermittent means the pain goes away completely between bouts)     Some better- patient has been taking her acid reflux medication  6. SEVERITY: "How bad is the pain?"  (e.g., Scale 1-10; mild, moderate, or severe)    - MILD (1-3): doesn't interfere with normal activities, abdomen soft and not tender to touch     - MODERATE (4-7): interferes with normal activities or awakens from sleep, tender to touch     - SEVERE (8-10): excruciating pain,  doubled over, unable to do any normal activities       5 7. RECURRENT SYMPTOM: "Have you ever had this type of abdominal pain before?" If so, ask: "When was the last time?" and "What happened that time?"      Yes- patient has had the ulcer 8. AGGRAVATING FACTORS: "Does anything seem to cause this pain?" (e.g., foods, stress, alcohol)     Patient thinks she ate the wrong food 9. CARDIAC SYMPTOMS: "Do you have any of the following symptoms: chest pain, difficulty breathing, sweating, nausea?"     no 10. OTHER SYMPTOMS: "Do you have any other symptoms?" (e.g., fever, vomiting, diarrhea)       no 11. PREGNANCY: "Is there any chance you are pregnant?" "When was your last menstrual period?"       n/a    Patient states she cooked and ate the wrong food last night that caused her to have an upset stomach with pain and vomiting.  She is somewhat better today- she has been taking her reflux medication. She is concerned that she flared her chronic ulcers.  Protocols used: HEADACHE-A-AH, ABDOMINAL PAIN - UPPER-A-AH

## 2017-02-24 NOTE — Telephone Encounter (Signed)
Patient did have an appointment with [facility] this afternoon, for some reason this was canceled.   Please have her update me when she is seen by urgent care.  I am happy to see her Monday next week if need be.

## 2017-02-25 NOTE — Telephone Encounter (Signed)
Lm on pts requesting a call back 

## 2017-03-03 NOTE — Telephone Encounter (Signed)
Message left for patient to return my call on 03/01/2017 and 03/03/2017

## 2017-03-04 ENCOUNTER — Telehealth: Payer: Self-pay | Admitting: Primary Care

## 2017-03-04 NOTE — Telephone Encounter (Signed)
Reviewed pts chart but unable to determine the reason for call

## 2017-03-04 NOTE — Telephone Encounter (Signed)
Copied from [place]. Topic: Quick Communication - Office Called Patient >> Mar 04, 2017  8:26 AM Robina Ade, Helene Kelp D wrote: Reason for CRM: Patient called and was returning office call back.

## 2017-03-07 NOTE — Telephone Encounter (Signed)
Noted  

## 2017-03-07 NOTE — Telephone Encounter (Signed)
Spoken to patient and she stated that she is much better now. Because of work, patient was unable to return call sooner.

## 2017-03-07 NOTE — Telephone Encounter (Signed)
Message left for patient to return my call. There was a Nurse Triage encounter on 02/24/17 regarding the return call.

## 2017-03-15 ENCOUNTER — Telehealth: Payer: Self-pay | Admitting: *Deleted

## 2017-03-15 ENCOUNTER — Ambulatory Visit: Payer: Self-pay | Admitting: Primary Care

## 2017-03-15 NOTE — Telephone Encounter (Signed)
No need to reschedule.  No fee.

## 2017-03-15 NOTE — Telephone Encounter (Signed)
Copied from [place] [id number]. Topic: Quick Communication - Appointment Cancellation >> Mar 15, 2017  8:48 AM Tanya Nguyen, NT wrote: Patient called to cancel appointment scheduled for  9;45 TODAY Patient HAS NOT: rescheduled their appointment.  Route to department's PEC pool.  Do you want late cancellation fee charged?  Should appointment be rescheduled?

## 2017-03-29 ENCOUNTER — Ambulatory Visit: Payer: BLUE CROSS/BLUE SHIELD | Admitting: Primary Care

## 2017-03-29 ENCOUNTER — Other Ambulatory Visit: Payer: Self-pay | Admitting: Primary Care

## 2017-03-29 ENCOUNTER — Encounter: Payer: Self-pay | Admitting: Primary Care

## 2017-03-29 VITALS — BP 118/68 | HR 74 | Temp 98.2°F | Ht 60.0 in | Wt 122.8 lb

## 2017-03-29 DIAGNOSIS — R5383 Other fatigue: Secondary | ICD-10-CM

## 2017-03-29 DIAGNOSIS — E559 Vitamin D deficiency, unspecified: Secondary | ICD-10-CM

## 2017-03-29 DIAGNOSIS — E038 Other specified hypothyroidism: Secondary | ICD-10-CM

## 2017-03-29 LAB — CBC
HEMATOCRIT: 40.3 % (ref 36.0–46.0)
Hemoglobin: 13.2 g/dL (ref 12.0–15.0)
MCHC: 32.9 g/dL (ref 30.0–36.0)
MCV: 91.3 fl (ref 78.0–100.0)
Platelets: 282 10*3/uL (ref 150.0–400.0)
RBC: 4.41 Mil/uL (ref 3.87–5.11)
RDW: 13.8 % (ref 11.5–15.5)
WBC: 6 10*3/uL (ref 4.0–10.5)

## 2017-03-29 LAB — BASIC METABOLIC PANEL
BUN: 17 mg/dL (ref 6–23)
CHLORIDE: 104 meq/L (ref 96–112)
CO2: 29 mEq/L (ref 19–32)
CREATININE: 0.87 mg/dL (ref 0.40–1.20)
Calcium: 8.8 mg/dL (ref 8.4–10.5)
GFR: 72.53 mL/min (ref >60.00)
Glucose, Bld: 91 mg/dL (ref 70–99)
POTASSIUM: 3.9 meq/L (ref 3.5–5.1)
SODIUM: 141 meq/L (ref 135–145)

## 2017-03-29 LAB — TSH: TSH: 0.86 u[IU]/mL (ref 0.35–4.50)

## 2017-03-29 LAB — VITAMIN D 25 HYDROXY (VIT D DEFICIENCY, FRACTURES): VITD: 15.66 ng/mL — AB (ref 30.00–100.00)

## 2017-03-29 LAB — VITAMIN B12: VITAMIN B 12: 167 pg/mL — AB (ref 211–911)

## 2017-03-29 LAB — T4, FREE: FREE T4: 1.03 ng/dL (ref 0.60–1.60)

## 2017-03-29 MED ORDER — VITAMIN D (ERGOCALCIFEROL) 1.25 MG (50000 UNIT) PO CAPS: ORAL_CAPSULE | ORAL | 0 refills | Status: DC

## 2017-03-29 NOTE — Patient Instructions (Signed)
Stop by the lab prior to leaving today. I will notify you of your results once received.   Start exercising. You should be getting 150 minutes of moderate intensity exercise weekly.  Work on stress reduction levels as this will increase fatigue.  It was a pleasure to see you today!   Fatigue Fatigue is feeling tired all of the time, a lack of energy, or a lack of motivation. Occasional or mild fatigue is often a normal response to activity or life in general. However, long-lasting (chronic) or extreme fatigue may indicate an underlying medical condition. Follow these instructions at home: Watch your fatigue for any changes. The following actions may help to lessen any discomfort you are feeling:  Talk to your health care provider about how much sleep you need each night. Try to get the required amount every night.  Take medicines only as directed by your health care provider.  Eat a healthy and nutritious diet. Ask your health care provider if you need help changing your diet.  Drink enough fluid to keep your urine clear or pale yellow.  Practice ways of relaxing, such as yoga, meditation, massage therapy, or acupuncture.  Exercise regularly.  Change situations that cause you stress. Try to keep your work and personal routine reasonable.  Do not abuse illegal drugs.  Limit alcohol intake to no more than 1 drink per day for nonpregnant women and 2 drinks per day for men. One drink equals 12 ounces of beer, 5 ounces of wine, or 1 ounces of hard liquor.  Take a multivitamin, if directed by your health care provider.  Contact a health care provider if:  Your fatigue does not get better.  You have a fever.  You have unintentional weight loss or gain.  You have headaches.  You have difficulty: ? Falling asleep. ? Sleeping throughout the night.  You feel angry, guilty, anxious, or sad.  You are unable to have a bowel movement (constipation).  You skin is dry.  Your  legs or another part of your body is swollen. Get help right away if:  You feel confused.  Your vision is blurry.  You feel faint or pass out.  You have a severe headache.  You have severe abdominal, pelvic, or back pain.  You have chest pain, shortness of breath, or an irregular or fast heartbeat.  You are unable to urinate or you urinate less than normal.  You develop abnormal bleeding, such as bleeding from the rectum, vagina, nose, lungs, or nipples.  You vomit blood.  You have thoughts about harming yourself or committing suicide.  You are worried that you might harm someone else. This information is not intended to replace advice given to you by your health care provider. Make sure you discuss any questions you have with your health care provider. Document Released: 12/20/2006 Document Revised: 07/31/2015 Document Reviewed: 06/26/2013 Elsevier Interactive Patient Education  [employer].

## 2017-03-29 NOTE — Assessment & Plan Note (Signed)
Chronic, worse recently.  Could be secondary to ongoing stress, lack of physical activity, medications. Check TSH, CBC, B12, Vitamin D, BMP today. Exam unremarkable. Recommended regular exercise, reduction in stress levels. Will await lab results to ensure no metabolic cause.

## 2017-03-29 NOTE — Progress Notes (Signed)
Subjective:    Patient ID: Tanya Nguyen, female    DOB: 1965/11/19, 52 y.o.   MRN: [id number]  HPI  Tanya Nguyen is a 52 year old female with a history of hypothyroidism, bipolar I disorder, insomnia, PTSD, migraines who presents today with a chief complaint of fatigue.   She also feels "sluggish" and wants to sleep all the time. She is under constant stress with her mother as she's the main caregiver. She denies dizziness, chest pain, palpitations, syncope, dysuria, urinary frequency, fevers, cough, unexplained weight loss.  Her last TSH was checked in August 2018 with a level of 1.43, T4 with level of 1.02. She is requesting a recheck today.   Review of Systems  Constitutional: Positive for fatigue. Negative for chills and fever.  Cardiovascular: Negative for chest pain and palpitations.  Neurological: Negative for dizziness and headaches.  Psychiatric/Behavioral: The patient is nervous/anxious.        Past Medical History:  Diagnosis Date  . Bleeding duodenal ulcer   . Depression   . GERD (gastroesophageal reflux disease)   . Hypothyroidism   . Kidney stones   . Migraine headache      Social History   Socioeconomic History  . Marital status: Married    Spouse name: Not on file  . Number of children: Not on file  . Years of education: Not on file  . Highest education level: Not on file  Social Needs  . Financial resource strain: Not on file  . Food insecurity - worry: Not on file  . Food insecurity - inability: Not on file  . Transportation needs - medical: Not on file  . Transportation needs - non-medical: Not on file  Occupational History  . Not on file  Tobacco Use  . Smoking status: Never Smoker  . Smokeless tobacco: Never Used  Substance and Sexual Activity  . Alcohol use: Yes    Alcohol/week: 0.0 oz  . Drug use: No  . Sexual activity: Yes    Birth control/protection: None  Other Topics Concern  . Not on file  Social History Narrative  . Not on file     Past Surgical History:  Procedure Laterality Date  . ABDOMINAL HYSTERECTOMY  2004   partial  . CHOLECYSTECTOMY    . TOOTH EXTRACTION      Family History  Problem Relation Age of Onset  . Cancer Mother   . Breast cancer Mother 93  . Cancer Father   . Alcohol abuse Sister   . Bipolar disorder Sister   . Alcohol abuse Maternal Grandmother   . Stroke Maternal Grandmother   . Stroke Paternal Grandmother     Allergies  Allergen Reactions  . Penicillins Shortness Of Breath, Diarrhea and Nausea And Vomiting    Has patient had a PCN reaction causing immediate rash, facial/tongue/throat swelling, SOB or lightheadedness with hypotension: yes Has patient had a PCN reaction causing severe rash involving mucus membranes or skin necrosis: no Has patient had a PCN reaction that required hospitalization no Has patient had a PCN reaction occurring within the last 10 years: about 10 years If all of the above answers are "NO", then may proceed with Cephalosporin use.   . Nsaids     Pt states she is not allergic   . Sulfa Antibiotics Nausea And Vomiting    Current Outpatient Medications on File Prior to Visit  Medication Sig Dispense Refill  . alum & mag hydroxide-simeth (MAALOX REGULAR STRENGTH) 200-200-20 MG/5ML suspension Take 15 mLs  by mouth every 6 (six) hours as needed for indigestion or heartburn. 355 mL 0  . cetirizine (ZYRTEC) 10 MG tablet Take 10 mg by mouth daily as needed for allergies.     . clonazePAM (KLONOPIN) 0.5 MG tablet Take 1 tablet (0.5 mg total) by mouth 2 (two) times daily as needed (anxiety). 10 tablet 0  . cyclobenzaprine (FLEXERIL) 5 MG tablet Take 1 tablet (5 mg total) by mouth 3 (three) times daily as needed for muscle spasms. 30 tablet 1  . levothyroxine (SYNTHROID, LEVOTHROID) 100 MCG tablet TAKE ONE TABLET DAILY BEFORE BREAKFAST. 30 tablet 11  . Linaclotide (LINZESS) 145 MCG CAPS capsule Take 1 capsule (145 mcg total) by mouth daily. 30 capsule 3  . lithium  carbonate 300 MG capsule Take 1 capsule (300 mg total) by mouth 2 (two) times daily with a meal. 30 capsule 0  . pantoprazole (PROTONIX) 40 MG tablet Take 1 tablet (40 mg total) by mouth daily. 30 tablet 1  . sertraline (ZOLOFT) 50 MG tablet Take 1 tablet (50 mg total) by mouth daily. 30 tablet 3  . SUMAtriptan (IMITREX) 25 MG tablet TAKE 1 TABLET EVERY 2 HOURS AS NEEDED FOR MIGRAINE, MAY REPEAT IN 2 HOURS IF HEADACHE PERSISTS OR RECURS 10 tablet 3  . traZODone (DESYREL) 150 MG tablet Take 1 tablet (150 mg total) by mouth at bedtime. 30 tablet 0   No current facility-administered medications on file prior to visit.     BP 118/68   Pulse 74   Temp 98.2 F (36.8 C) (Oral)   Ht 5' (1.524 m)   Wt 122 lb 12.8 oz (55.7 kg)   SpO2 98%   BMI 23.98 kg/m    Objective:   Physical Exam  Constitutional: She appears well-nourished.  HENT:  Right Ear: Tympanic membrane and ear canal normal.  Left Ear: Tympanic membrane and ear canal normal.  Nose: Right sinus exhibits no maxillary sinus tenderness and no frontal sinus tenderness. Left sinus exhibits no maxillary sinus tenderness and no frontal sinus tenderness.  Mouth/Throat: Oropharynx is clear and moist.  Eyes: Conjunctivae are normal.  Neck: Neck supple. No thyromegaly present.  Cardiovascular: Normal rate and regular rhythm.  Pulmonary/Chest: Effort normal and breath sounds normal. She has no wheezes. She has no rales.  Lymphadenopathy:    She has no cervical adenopathy.  Skin: Skin is warm and dry.          Assessment & Plan:

## 2017-03-30 ENCOUNTER — Encounter: Payer: Self-pay | Admitting: *Deleted

## 2017-03-31 ENCOUNTER — Telehealth: Payer: Self-pay | Admitting: Primary Care

## 2017-03-31 DIAGNOSIS — E538 Deficiency of other specified B group vitamins: Secondary | ICD-10-CM

## 2017-03-31 DIAGNOSIS — F3130 Bipolar disorder, current episode depressed, mild or moderate severity, unspecified: Secondary | ICD-10-CM

## 2017-03-31 MED ORDER — "SYRINGE/NEEDLE (DISP) 25G X 1"" 3 ML MISC": 0 refills | Status: DC

## 2017-03-31 MED ORDER — CYANOCOBALAMIN 1000 MCG/ML IJ SOLN: INTRAMUSCULAR | 1 refills | Status: DC

## 2017-03-31 NOTE — Telephone Encounter (Signed)
Copied from [place] [id number]. Topic: Quick Communication - Rx Refill/Question >> Mar 31, 2017 10:37 AM Scherrie Gerlach wrote: Medication:  Vitamin D, Ergocalciferol, (DRISDOL) 50000 units CAPS capsule  Rx was sent to the wrong pharmacy. Pt only uses  [facility] - [place], [place] - [phone] (Phone) [phone] (Fax)  Please resend, thank you!

## 2017-03-31 NOTE — Telephone Encounter (Signed)
Message left for patient to return my call.  

## 2017-03-31 NOTE — Addendum Note (Signed)
Addended by: Pleas Koch on: 03/31/2017 12:25 PM   Modules accepted: Orders

## 2017-03-31 NOTE — Telephone Encounter (Signed)
Okay to phone in as prescribed on the chart. #3 vials, 1 refill. Please set her up for repeat B12 lab test in 3 months. Also please send in syringes.

## 2017-03-31 NOTE — Telephone Encounter (Signed)
Pt called and message left on voicemail to contact [facility] to retreive the medication that wast sent [place].

## 2017-03-31 NOTE — Addendum Note (Signed)
Addended by: Jacqualin Combes on: 03/31/2017 01:03 PM   Modules accepted: Orders

## 2017-03-31 NOTE — Telephone Encounter (Signed)
Pt returned call, was not able to get [place].

## 2017-03-31 NOTE — Telephone Encounter (Signed)
Copied from [place] [id number]. Topic: Quick Communication - Rx Refill/Question >> Mar 31, 2017 10:51 AM Scherrie Gerlach wrote: Medication:  B12 injections and the needles Pt would like a rx called into the pharmacy.  The pharmacist advised pt it would be less expensive for her to give them to herself (or her husband) So pt wuld need needles called in as well per her pharmacist Because pt states she cannot afford to come into the office to get these injections. Also would like to clarify the frequency of the b12 injections.

## 2017-04-01 NOTE — Telephone Encounter (Signed)
Returning call again, requesting call back [phone]

## 2017-04-01 NOTE — Addendum Note (Signed)
Addended by: Pleas Koch on: 04/01/2017 01:13 PM   Modules accepted: Orders

## 2017-04-01 NOTE — Telephone Encounter (Signed)
Spoken and notified patient of Rx for the b12 and to inject once a month. I have patient to stop to show her how to do the injection.   Also patient like to know if Anda Kraft can send a referral to psychiatry. Patient currently goes to [facility] but they are now out of network under of new insurance.

## 2017-04-01 NOTE — Telephone Encounter (Signed)
Noted and referral placed

## 2017-04-14 ENCOUNTER — Ambulatory Visit: Payer: Self-pay | Admitting: *Deleted

## 2017-04-14 NOTE — Telephone Encounter (Signed)
Patient is calling to report that she has a history of bleeding ulcer and last night she feels she ate the wrong thing and she had a major attack. She had midline upper gastric pain that lasted all night. She had to take a double dose of her medication last night and this morning and she has finally gotten her symptoms to settle down. Pain was accompanied with burping and sour taste in mouth. She did have radiating pain to her back- but no other cardiac symptoms. She has an appointment tomorrow and she was encouraged to eat a small snack this morning to see how she responds and call back if she has pain. She reports no blood in stool and no vomiting. Reason for Disposition . [1] Abdominal pain is intermittent AND [2] shoots into chest, with sour taste in mouth  Answer Assessment - Initial Assessment Questions 1. LOCATION: "Where does it hurt?"      Pain is midline under breast and radiates around to back. Patient took her reflux medication and it did not touch the pain- finally eased off this morning. 2. RADIATION: "Does the pain shoot anywhere else?" (e.g., chest, back)     Radiates to back 3. ONSET: "When did the pain begin?" (e.g., minutes, hours or days ago)      Yesterday- 3pm 4. SUDDEN: "Gradual or sudden onset?"     Sudden onset 5. PATTERN "Does the pain come and go, or is it constant?"    - If constant: "Is it getting better, staying the same, or worsening?"      (Note: Constant means the pain never goes away completely; most serious pain is constant and it progresses)     - If intermittent: "How long does it last?" "Do you have pain now?"     (Note: Intermittent means the pain goes away completely between bouts)     Pain is gone now- patient has not eaten because she is afraid she will have more pain  6. SEVERITY: "How bad is the pain?"  (e.g., Scale 1-10; mild, moderate, or severe)    - MILD (1-3): doesn't interfere with normal activities, abdomen soft and not tender to touch     -  MODERATE (4-7): interferes with normal activities or awakens from sleep, tender to touch     - SEVERE (8-10): excruciating pain, doubled over, unable to do any normal activities       Pain was severe last night- gone now- patient took double dose of medication 7. RECURRENT SYMPTOM: "Have you ever had this type of abdominal pain before?" If so, ask: "When was the last time?" and "What happened that time?"      Yes- hx of bleeding ulcer 8. AGGRAVATING FACTORS: "Does anything seem to cause this pain?" (e.g., foods, stress, alcohol)     Salmon patty - food- fried 9. CARDIAC SYMPTOMS: "Do you have any of the following symptoms: chest pain, difficulty breathing, sweating, nausea?"     Patient feels tender pain middle breastbone- severe heartburn radiating last night 10. OTHER SYMPTOMS: "Do you have any other symptoms?" (e.g., fever, vomiting, diarrhea)       Diarrhea on Tuesday , feels hot at times 11. PREGNANCY: "Is there any chance you are pregnant?" "When was your last menstrual period?"       n/a  Protocols used: ABDOMINAL PAIN - UPPER-A-AH

## 2017-04-14 NOTE — Telephone Encounter (Signed)
Noted and agree with Ms. Sammuel Hines recommendations.

## 2017-04-15 ENCOUNTER — Ambulatory Visit: Payer: BLUE CROSS/BLUE SHIELD | Admitting: Primary Care

## 2017-04-15 ENCOUNTER — Encounter: Payer: Self-pay | Admitting: Primary Care

## 2017-04-15 DIAGNOSIS — K219 Gastro-esophageal reflux disease without esophagitis: Secondary | ICD-10-CM

## 2017-04-15 MED ORDER — RANITIDINE HCL 150 MG PO TABS: ORAL_TABLET | ORAL | 0 refills | Status: DC

## 2017-04-15 MED ORDER — PANTOPRAZOLE SODIUM 40 MG PO TBEC: DELAYED_RELEASE_TABLET | ORAL | 1 refills | Status: DC

## 2017-04-15 NOTE — Patient Instructions (Signed)
Take your pantoprazole 40 mg everyday in the morning.  Take the ranitidine (Zantac) 150 mg in the afternoon/evening as needed for heartburn.  Please call me if your heartburn persists.   Stop by the front desk and speak with either Marior or Ebony Hail regarding your referral to psychiatry.  It was a pleasure to see you today!   Food Choices for Gastroesophageal Reflux Disease, Adult When you have gastroesophageal reflux disease (GERD), the foods you eat and your eating habits are very important. Choosing the right foods can help ease your discomfort. What guidelines do I need to follow?  Choose fruits, vegetables, whole grains, and low-fat dairy products.  Choose low-fat meat, fish, and poultry.  Limit fats such as oils, salad dressings, butter, nuts, and avocado.  Keep a food diary. This helps you identify foods that cause symptoms.  Avoid foods that cause symptoms. These may be different for everyone.  Eat small meals often instead of 3 large meals a day.  Eat your meals slowly, in a place where you are relaxed.  Limit fried foods.  Cook foods using methods other than frying.  Avoid drinking alcohol.  Avoid drinking large amounts of liquids with your meals.  Avoid bending over or lying down until 2-3 hours after eating. What foods are not recommended? These are some foods and drinks that may make your symptoms worse: Vegetables Tomatoes. Tomato juice. Tomato and spaghetti sauce. Chili peppers. Onion and garlic. Horseradish. Fruits Oranges, grapefruit, and lemon (fruit and juice). Meats High-fat meats, fish, and poultry. This includes hot dogs, ribs, ham, sausage, salami, and bacon. Dairy Whole milk and chocolate milk. Sour cream. Cream. Butter. Ice cream. Cream cheese. Drinks Coffee and tea. Bubbly (carbonated) drinks or energy drinks. Condiments Hot sauce. Barbecue sauce. Sweets/Desserts Chocolate and cocoa. Donuts. Peppermint and spearmint. Fats and  Oils High-fat foods. This includes [place] fries and potato chips. Other Vinegar. Strong spices. This includes black pepper, white pepper, red pepper, cayenne, curry powder, cloves, ginger, and chili powder. The items listed above may not be a complete list of foods and drinks to avoid. Contact your dietitian for more information. This information is not intended to replace advice given to you by your health care provider. Make sure you discuss any questions you have with your health care provider. Document Released: 08/24/2011 Document Revised: 07/31/2015 Document Reviewed: 12/27/2012 Elsevier Interactive Patient Education  2017 [employer].

## 2017-04-15 NOTE — Progress Notes (Signed)
Subjective:    Patient ID: Tanya Nguyen, female    DOB: 03/30/1965, 52 y.o.   MRN: [id number]  HPI  Tanya Nguyen is a 52 year old female with a history of anxiety, bleeding gastric ulcer who presents today with a chief complaint of abdominal pain.  She called yesterday with reports of mid epigastric pain that began after she "ate the wrong thing". Pain improved with several doses of her medication. She also endorsed belching with a sour taste in the mouth.   She presents today reporting that she ate a fried salmon patty, squash casserole, broccoli casserole, and tea. She's been taking 80 mg of her pantoprazole for the last two days with improvement. She doesn't take this daily but endorses daily symptoms of esophageal burning. Overall her pain has improved. She denies rectal bleeding, bloody emesis, fevers.   Review of Systems  Gastrointestinal: Positive for abdominal pain and nausea. Negative for constipation, diarrhea and vomiting.       GERD       Past Medical History:  Diagnosis Date  . Bleeding duodenal ulcer   . Depression   . GERD (gastroesophageal reflux disease)   . Hypothyroidism   . Kidney stones   . Migraine headache      Social History   Socioeconomic History  . Marital status: Married    Spouse name: Not on file  . Number of children: Not on file  . Years of education: Not on file  . Highest education level: Not on file  Social Needs  . Financial resource strain: Not on file  . Food insecurity - worry: Not on file  . Food insecurity - inability: Not on file  . Transportation needs - medical: Not on file  . Transportation needs - non-medical: Not on file  Occupational History  . Not on file  Tobacco Use  . Smoking status: Never Smoker  . Smokeless tobacco: Never Used  Substance and Sexual Activity  . Alcohol use: Yes    Alcohol/week: 0.0 oz  . Drug use: No  . Sexual activity: Yes    Birth control/protection: None  Other Topics Concern  . Not on file    Social History Narrative  . Not on file    Past Surgical History:  Procedure Laterality Date  . ABDOMINAL HYSTERECTOMY  2004   partial  . CHOLECYSTECTOMY    . TOOTH EXTRACTION      Family History  Problem Relation Age of Onset  . Cancer Mother   . Breast cancer Mother 43  . Cancer Father   . Alcohol abuse Sister   . Bipolar disorder Sister   . Alcohol abuse Maternal Grandmother   . Stroke Maternal Grandmother   . Stroke Paternal Grandmother     Allergies  Allergen Reactions  . Penicillins Shortness Of Breath, Diarrhea and Nausea And Vomiting    Has patient had a PCN reaction causing immediate rash, facial/tongue/throat swelling, SOB or lightheadedness with hypotension: yes Has patient had a PCN reaction causing severe rash involving mucus membranes or skin necrosis: no Has patient had a PCN reaction that required hospitalization no Has patient had a PCN reaction occurring within the last 10 years: about 10 years If all of the above answers are "NO", then may proceed with Cephalosporin use.   . Nsaids     Pt states she is not allergic   . Sulfa Antibiotics Nausea And Vomiting    Current Outpatient Medications on File Prior to Visit  Medication Sig  Dispense Refill  . alum & mag hydroxide-simeth (MAALOX REGULAR STRENGTH) 200-200-20 MG/5ML suspension Take 15 mLs by mouth every 6 (six) hours as needed for indigestion or heartburn. 355 mL 0  . cetirizine (ZYRTEC) 10 MG tablet Take 10 mg by mouth daily as needed for allergies.     . clonazePAM (KLONOPIN) 0.5 MG tablet Take 1 tablet (0.5 mg total) by mouth 2 (two) times daily as needed (anxiety). 10 tablet 0  . cyanocobalamin (,VITAMIN B-12,) 1000 MCG/ML injection Inject into the skin once monthly for 6 months for low vitamin B 12. 3 mL 1  . cyclobenzaprine (FLEXERIL) 5 MG tablet Take 1 tablet (5 mg total) by mouth 3 (three) times daily as needed for muscle spasms. 30 tablet 1  . levothyroxine (SYNTHROID, LEVOTHROID) 100 MCG  tablet TAKE ONE TABLET DAILY BEFORE BREAKFAST. 30 tablet 11  . Linaclotide (LINZESS) 145 MCG CAPS capsule Take 1 capsule (145 mcg total) by mouth daily. 30 capsule 3  . lithium carbonate 300 MG capsule Take 1 capsule (300 mg total) by mouth 2 (two) times daily with a meal. 30 capsule 0  . sertraline (ZOLOFT) 50 MG tablet Take 1 tablet (50 mg total) by mouth daily. 30 tablet 3  . SUMAtriptan (IMITREX) 25 MG tablet TAKE 1 TABLET EVERY 2 HOURS AS NEEDED FOR MIGRAINE, MAY REPEAT IN 2 HOURS IF HEADACHE PERSISTS OR RECURS 10 tablet 3  . SYRINGE-NEEDLE, DISP, 3 ML (B-D 3CC LUER-LOK SYR 25GX1") 25G X 1" 3 ML MISC Use as instructed to injection B12 every 30 days 10 each 0  . traZODone (DESYREL) 150 MG tablet Take 1 tablet (150 mg total) by mouth at bedtime. 30 tablet 0  . Vitamin D, Ergocalciferol, (DRISDOL) 50000 units CAPS capsule Take 1 capsule by mouth once weekly for 12 weeks. 12 capsule 0   No current facility-administered medications on file prior to visit.     BP 116/68   Pulse 78   Temp 98.1 F (36.7 C) (Oral)   Ht 5' (1.524 m)   Wt 120 lb 12.8 oz (54.8 kg)   SpO2 98%   BMI 23.59 kg/m    Objective:   Physical Exam  Cardiovascular: Normal rate and regular rhythm.  Pulmonary/Chest: Effort normal and breath sounds normal.  Abdominal: Soft. Bowel sounds are normal. There is no tenderness.  Skin: Skin is warm and dry.          Assessment & Plan:

## 2017-04-15 NOTE — Assessment & Plan Note (Addendum)
Suspect recent episode related to GERD. Restart  pantoprazole 40 mg every morning, add in Zantac 150 mg HS PRN. She will update if symptoms persist. Reduce pantoprazole to 20 mg in 3-6 months.

## 2017-04-18 ENCOUNTER — Telehealth: Payer: Self-pay | Admitting: Internal Medicine

## 2017-04-18 NOTE — Telephone Encounter (Signed)
Copied from [place]. Topic: Quick Communication - See Telephone Encounter >> Apr 18, 2017 10:02 AM Burnis Medin, NT wrote: CRM for notification. See Telephone encounter for: Patient called and wanted to see if the nurse or doctor could give her a call back regarding her lithium carbonate 300 MG capsule, sertraline (ZOLOFT) 50 MG tablet and traZODone (DESYREL) 150 MG tablet.  04/18/17.

## 2017-04-18 NOTE — Telephone Encounter (Signed)
Please have patient notify psychiatry of her inability to afford follow-up visits and that primary care cannot manage lithium.  Please have her also look into [facility] or [facility] as both of these places are more affordable for people who cannot afford regular psychiatry follow-up.  I am happy to refill Zoloft and trazodone, but she should look into this other places in order to maintain her lithium.  She does not need a referral for our [facility] or [facility], she can just call and schedule an appointment or walk-in at any time.

## 2017-04-18 NOTE — Telephone Encounter (Signed)
Spoken to patient. She stated that she did schedule appointment with psychiatry and therapy but she won't be able to keep this up. They wanted her to follow up every month and she could not afford it. Patient stated with her deductible and co-pay are to too much and she would have not enough to pay out of pocket even if they are in network. Patient stated that she only work part time.  Patient wanted to know if Tanya Nguyen is willing to prescribed her Zoloft and Trazodone for her. Patient stated that she may have to stop taking the lithium carbonate because she won't be able to afford seeing psychiatry.  Please advise.

## 2017-04-19 NOTE — Telephone Encounter (Signed)
Spoken and notified patient of Kate's comments. Patient verbalized understanding. 

## 2017-05-16 ENCOUNTER — Ambulatory Visit: Payer: BLUE CROSS/BLUE SHIELD | Admitting: Primary Care

## 2017-05-16 ENCOUNTER — Encounter: Payer: Self-pay | Admitting: Primary Care

## 2017-05-16 DIAGNOSIS — G43701 Chronic migraine without aura, not intractable, with status migrainosus: Secondary | ICD-10-CM

## 2017-05-16 DIAGNOSIS — G43B0 Ophthalmoplegic migraine, not intractable: Secondary | ICD-10-CM

## 2017-05-16 MED ORDER — KETOROLAC TROMETHAMINE 60 MG/2ML IM SOLN
60.0000 mg | Freq: Once | INTRAMUSCULAR | Status: AC
  Administered 2017-05-16: 60 mg via INTRAMUSCULAR

## 2017-05-16 NOTE — Assessment & Plan Note (Signed)
Located to right parietal and temporal region x 3 days. Some improvement with sumatriptan, no resolve. IM Toradol 60 mg provided in the office today. She will update if no improvement.

## 2017-05-16 NOTE — Patient Instructions (Addendum)
Your symptoms are representative of a viral illness which will resolve on its own over time. Our goal is to treat your symptoms in order to aid your body in the healing process and to make you more comfortable.   You can try Delsym or Robitussin for cough.   Drink plenty of fluids and get rest.   Please notify me if you develop persistent fevers of 101, start coughing up green mucous, or feel worse after 1 week of onset of symptoms.   Increase consumption of water intake and rest.  It was a pleasure to see you today!

## 2017-05-16 NOTE — Progress Notes (Signed)
Subjective:    Patient ID: Tanya Nguyen, female    DOB: May 28, 1965, 52 y.o.   MRN: [id number]  HPI  Tanya Nguyen is a 52 year old female who presents today with a chief complaint of fatigue. She also reports body aches, headaches, nasal congestion, cough, sore throat, dry cough.   Her symptoms began three days ago. She doesn't think she's noticed fevers, doesn't think she's been exposed to influenza. She's been taking cough drops and with some improvement. She had her flu shot this season.   Her migraine has been present for the last three days and is located to the right parietal and temporal region of her head. She's been taking her sumatriptan with some improvement but no resolve. She endorses photophobia and phonophobia.   Review of Systems  Constitutional: Positive for fatigue. Negative for fever.  HENT: Positive for congestion and sore throat. Negative for ear pain.   Eyes: Positive for photophobia.  Respiratory: Positive for cough.   Cardiovascular: Negative for chest pain.  Neurological: Positive for headaches.       Past Medical History:  Diagnosis Date  . Bleeding duodenal ulcer   . Depression   . GERD (gastroesophageal reflux disease)   . Hypothyroidism   . Kidney stones   . Migraine headache      Social History   Socioeconomic History  . Marital status: Married    Spouse name: Not on file  . Number of children: Not on file  . Years of education: Not on file  . Highest education level: Not on file  Social Needs  . Financial resource strain: Not on file  . Food insecurity - worry: Not on file  . Food insecurity - inability: Not on file  . Transportation needs - medical: Not on file  . Transportation needs - non-medical: Not on file  Occupational History  . Not on file  Tobacco Use  . Smoking status: Never Smoker  . Smokeless tobacco: Never Used  Substance and Sexual Activity  . Alcohol use: Yes    Alcohol/week: 0.0 oz  . Drug use: No  . Sexual activity:  Yes    Birth control/protection: None  Other Topics Concern  . Not on file  Social History Narrative  . Not on file    Past Surgical History:  Procedure Laterality Date  . ABDOMINAL HYSTERECTOMY  2004   partial  . CHOLECYSTECTOMY    . TOOTH EXTRACTION      Family History  Problem Relation Age of Onset  . Cancer Mother   . Breast cancer Mother 78  . Cancer Father   . Alcohol abuse Sister   . Bipolar disorder Sister   . Alcohol abuse Maternal Grandmother   . Stroke Maternal Grandmother   . Stroke Paternal Grandmother     Allergies  Allergen Reactions  . Penicillins Shortness Of Breath, Diarrhea and Nausea And Vomiting    Has patient had a PCN reaction causing immediate rash, facial/tongue/throat swelling, SOB or lightheadedness with hypotension: yes Has patient had a PCN reaction causing severe rash involving mucus membranes or skin necrosis: no Has patient had a PCN reaction that required hospitalization no Has patient had a PCN reaction occurring within the last 10 years: about 10 years If all of the above answers are "NO", then may proceed with Cephalosporin use.   . Nsaids     Pt states she is not allergic  Other reaction(s): OTHER  . Sulfa Antibiotics Nausea And Vomiting  Other reaction(s): VOMITING    Current Outpatient Medications on File Prior to Visit  Medication Sig Dispense Refill  . alum & mag hydroxide-simeth (MAALOX REGULAR STRENGTH) 200-200-20 MG/5ML suspension Take 15 mLs by mouth every 6 (six) hours as needed for indigestion or heartburn. 355 mL 0  . cetirizine (ZYRTEC) 10 MG tablet Take 10 mg by mouth daily as needed for allergies.     . cyanocobalamin (,VITAMIN B-12,) 1000 MCG/ML injection Inject into the skin once monthly for 6 months for low vitamin B 12. 3 mL 1  . cyclobenzaprine (FLEXERIL) 5 MG tablet Take 1 tablet (5 mg total) by mouth 3 (three) times daily as needed for muscle spasms. 30 tablet 1  . levothyroxine (SYNTHROID, LEVOTHROID) 100  MCG tablet TAKE ONE TABLET DAILY BEFORE BREAKFAST. 30 tablet 11  . Linaclotide (LINZESS) 145 MCG CAPS capsule Take 1 capsule (145 mcg total) by mouth daily. 30 capsule 3  . pantoprazole (PROTONIX) 40 MG tablet Take 1 tablet by mouth every morning for heartburn. 90 tablet 1  . ranitidine (ZANTAC) 150 MG tablet Take 1 tablet by mouth at bedtime as needed for heartburn. 90 tablet 0  . sertraline (ZOLOFT) 50 MG tablet Take 1 tablet (50 mg total) by mouth daily. 30 tablet 3  . SUMAtriptan (IMITREX) 25 MG tablet TAKE 1 TABLET EVERY 2 HOURS AS NEEDED FOR MIGRAINE, MAY REPEAT IN 2 HOURS IF HEADACHE PERSISTS OR RECURS 10 tablet 3  . SYRINGE-NEEDLE, DISP, 3 ML (B-D 3CC LUER-LOK SYR 25GX1") 25G X 1" 3 ML MISC Use as instructed to injection B12 every 30 days 10 each 0  . traZODone (DESYREL) 150 MG tablet Take 1 tablet (150 mg total) by mouth at bedtime. 30 tablet 0  . Vitamin D, Ergocalciferol, (DRISDOL) 50000 units CAPS capsule Take 1 capsule by mouth once weekly for 12 weeks. 12 capsule 0  . lithium carbonate 300 MG capsule Take 1 capsule (300 mg total) by mouth 2 (two) times daily with a meal. (Patient not taking: Reported on 05/16/2017) 30 capsule 0   No current facility-administered medications on file prior to visit.     BP 120/70   Pulse 86   Temp 98.1 F (36.7 C) (Oral)   Ht 5' (1.524 m)   Wt 118 lb (53.5 kg)   SpO2 98%   BMI 23.05 kg/m     Objective:   Physical Exam  Constitutional: She appears well-nourished.  HENT:  Right Ear: Tympanic membrane and ear canal normal.  Left Ear: Tympanic membrane and ear canal normal.  Nose: Right sinus exhibits no maxillary sinus tenderness and no frontal sinus tenderness. Left sinus exhibits no maxillary sinus tenderness and no frontal sinus tenderness.  Mouth/Throat: Oropharynx is clear and moist.  Eyes: Conjunctivae are normal.  Neck: Neck supple.  Cardiovascular: Normal rate and regular rhythm.  Pulmonary/Chest: Effort normal and breath sounds  normal. She has no wheezes. She has no rales.  Lymphadenopathy:    She has no cervical adenopathy.  Skin: Skin is warm and dry.          Assessment & Plan:  Flu-Like Symptoms:  Present for the past 3 days. Exam today overall unremarkable, does appear uncomfortable from migraine. Rapid Flu: Negative. Suspect viral involvement and will treat with conservative measures. Discussed use of Delsym/Robitussin, Tylenol/Ibuprofen, fluids, rest. Toradol 60 mg IM provided today for migraine. She will update if no improvement.  Pleas Koch, NP

## 2017-06-06 ENCOUNTER — Ambulatory Visit: Payer: Self-pay | Admitting: Podiatry

## 2017-06-10 ENCOUNTER — Ambulatory Visit: Payer: BLUE CROSS/BLUE SHIELD | Admitting: Primary Care

## 2017-06-10 ENCOUNTER — Encounter: Payer: Self-pay | Admitting: Primary Care

## 2017-06-10 VITALS — BP 120/72 | HR 88 | Temp 98.2°F | Ht 60.0 in | Wt 121.8 lb

## 2017-06-10 DIAGNOSIS — G43809 Other migraine, not intractable, without status migrainosus: Secondary | ICD-10-CM | POA: Diagnosis not present

## 2017-06-10 DIAGNOSIS — R8290 Unspecified abnormal findings in urine: Secondary | ICD-10-CM | POA: Diagnosis not present

## 2017-06-10 LAB — POC URINALSYSI DIPSTICK (AUTOMATED)
Bilirubin, UA: NEGATIVE
Blood, UA: NEGATIVE
Glucose, UA: NEGATIVE
Ketones, UA: NEGATIVE
Leukocytes, UA: NEGATIVE
Nitrite, UA: NEGATIVE
PH UA: 5.5 (ref 5.0–8.0)
PROTEIN UA: NEGATIVE
UROBILINOGEN UA: 0.2 U/dL

## 2017-06-10 MED ORDER — KETOROLAC TROMETHAMINE 60 MG/2ML IM SOLN
60.0000 mg | Freq: Once | INTRAMUSCULAR | Status: AC
  Administered 2017-06-10: 60 mg via INTRAMUSCULAR

## 2017-06-10 MED ORDER — PROPRANOLOL HCL ER 80 MG PO CP24: ORAL_CAPSULE | ORAL | 0 refills | Status: DC

## 2017-06-10 NOTE — Progress Notes (Signed)
Subjective:    Patient ID: Tanya Nguyen, female    DOB: [birth year], 51 y.o.   MRN: [id number]  HPI  Tanya Nguyen is a 52 year old female who presents today with multiple complaints.  1) Vaginal Odor: Foul smell from the vagina that has been present for the past 1 month. She also reports clear vaginal discharge, dysuria that is intermittent. She denies hematuria, urinary frequency.  2) Migraine: History of migraines and is not currently managed on preventative treatment. She was last treated for a migraine in mid March 2019. Her migraine began this morning when waking and is located to the right parietal region. She describes her pain as throbbing which has been constant. She also reports photophobia and nausea. She's not taken anything today for her symptoms as she was driving to her appointment today.   Migraines occur three times monthly on average. She's never been on migraine preventative treatment in the past. She is under a lot of stress as she's the sole caregiver of her mother.   Review of Systems  Eyes: Positive for photophobia.  Gastrointestinal: Positive for nausea.  Neurological: Positive for headaches.       Past Medical History:  Diagnosis Date  . Bleeding duodenal ulcer   . Depression   . GERD (gastroesophageal reflux disease)   . Hypothyroidism   . Kidney stones   . Migraine headache      Social History   Socioeconomic History  . Marital status: Married    Spouse name: Not on file  . Number of children: Not on file  . Years of education: Not on file  . Highest education level: Not on file  Occupational History  . Not on file  Social Needs  . Financial resource strain: Not on file  . Food insecurity:    Worry: Not on file    Inability: Not on file  . Transportation needs:    Medical: Not on file    Non-medical: Not on file  Tobacco Use  . Smoking status: Never Smoker  . Smokeless tobacco: Never Used  Substance and Sexual Activity  . Alcohol use: Yes      Alcohol/week: 0.0 oz  . Drug use: No  . Sexual activity: Yes    Birth control/protection: None  Lifestyle  . Physical activity:    Days per week: Not on file    Minutes per session: Not on file  . Stress: Not on file  Relationships  . Social connections:    Talks on phone: Not on file    Gets together: Not on file    Attends religious service: Not on file    Active member of club or organization: Not on file    Attends meetings of clubs or organizations: Not on file    Relationship status: Not on file  . Intimate partner violence:    Fear of current or ex partner: Not on file    Emotionally abused: Not on file    Physically abused: Not on file    Forced sexual activity: Not on file  Other Topics Concern  . Not on file  Social History Narrative  . Not on file    Past Surgical History:  Procedure Laterality Date  . ABDOMINAL HYSTERECTOMY  2004   partial  . CHOLECYSTECTOMY    . TOOTH EXTRACTION      Family History  Problem Relation Age of Onset  . Cancer Mother   . Breast cancer Mother 88  . Cancer  Father   . Alcohol abuse Sister   . Bipolar disorder Sister   . Alcohol abuse Maternal Grandmother   . Stroke Maternal Grandmother   . Stroke Paternal Grandmother     Allergies  Allergen Reactions  . Penicillins Shortness Of Breath, Diarrhea and Nausea And Vomiting    Has patient had a PCN reaction causing immediate rash, facial/tongue/throat swelling, SOB or lightheadedness with hypotension: yes Has patient had a PCN reaction causing severe rash involving mucus membranes or skin necrosis: no Has patient had a PCN reaction that required hospitalization no Has patient had a PCN reaction occurring within the last 10 years: about 10 years If all of the above answers are "NO", then may proceed with Cephalosporin use.   . Nsaids     Pt states she is not allergic  Other reaction(s): OTHER  . Sulfa Antibiotics Nausea And Vomiting    Other reaction(s): VOMITING     Current Outpatient Medications on File Prior to Visit  Medication Sig Dispense Refill  . alum & mag hydroxide-simeth (MAALOX REGULAR STRENGTH) 200-200-20 MG/5ML suspension Take 15 mLs by mouth every 6 (six) hours as needed for indigestion or heartburn. 355 mL 0  . cetirizine (ZYRTEC) 10 MG tablet Take 10 mg by mouth daily as needed for allergies.     . cyanocobalamin (,VITAMIN B-12,) 1000 MCG/ML injection Inject into the skin once monthly for 6 months for low vitamin B 12. 3 mL 1  . cyclobenzaprine (FLEXERIL) 5 MG tablet Take 1 tablet (5 mg total) by mouth 3 (three) times daily as needed for muscle spasms. 30 tablet 1  . levothyroxine (SYNTHROID, LEVOTHROID) 100 MCG tablet TAKE ONE TABLET DAILY BEFORE BREAKFAST. 30 tablet 11  . Linaclotide (LINZESS) 145 MCG CAPS capsule Take 1 capsule (145 mcg total) by mouth daily. 30 capsule 3  . pantoprazole (PROTONIX) 40 MG tablet Take 1 tablet by mouth every morning for heartburn. 90 tablet 1  . ranitidine (ZANTAC) 150 MG tablet Take 1 tablet by mouth at bedtime as needed for heartburn. 90 tablet 0  . sertraline (ZOLOFT) 50 MG tablet Take 1 tablet (50 mg total) by mouth daily. 30 tablet 3  . SYRINGE-NEEDLE, DISP, 3 ML (B-D 3CC LUER-LOK SYR 25GX1") 25G X 1" 3 ML MISC Use as instructed to injection B12 every 30 days 10 each 0  . traZODone (DESYREL) 150 MG tablet Take 1 tablet (150 mg total) by mouth at bedtime. 30 tablet 0  . Vitamin D, Ergocalciferol, (DRISDOL) 50000 units CAPS capsule Take 1 capsule by mouth once weekly for 12 weeks. 12 capsule 0  . SUMAtriptan (IMITREX) 25 MG tablet TAKE 1 TABLET EVERY 2 HOURS AS NEEDED FOR MIGRAINE, MAY REPEAT IN 2 HOURS IF HEADACHE PERSISTS OR RECURS (Patient not taking: Reported on 06/10/2017) 10 tablet 3   No current facility-administered medications on file prior to visit.     BP 120/72   Pulse 88   Temp 98.2 F (36.8 C) (Oral)   Ht 5' (1.524 m)   Wt 121 lb 12 oz (55.2 kg)   SpO2 99%   BMI 23.78 kg/m     Objective:   Physical Exam  Constitutional: She is oriented to person, place, and time. She appears well-nourished.  Eyes: EOM are normal.  Cardiovascular: Normal rate.  Pulmonary/Chest: Effort normal.  Genitourinary: There is no lesion on the right labia. There is no lesion on the left labia. Cervix exhibits no motion tenderness and no discharge. No vaginal discharge found.  Neurological:  She is alert and oriented to person, place, and time. No cranial nerve deficit.          Assessment & Plan:  Vaginal Odor:  Present for the past one month. Exam today unremarkable. UA today negative for infection. Wet prep completed and set off for testing. Await results.  Pleas Koch, NP

## 2017-06-10 NOTE — Patient Instructions (Signed)
Start propranolol 80 mg for migraine prevention. Take 1 capsule by mouth once every morning.  We will be in touch once we receive your vaginal specimen.   Schedule a 30 minute office visit in 2 weeks for re-evaluation of your migraines.   It was a pleasure to see you today!

## 2017-06-10 NOTE — Assessment & Plan Note (Signed)
Occurring 3-4 times monthly which is very frequent. Treat today with IM Toradol 60 mg. Discussed recurrent migraines and she agrees to migraine preventative treatment.   Rx for propranolol sent to pharmacy. HR and BP can handle low dose. Discussed to monitor BP and HR if possible.  Follow up in 2 weeks for re-evaluation of migraines, BP and HR.

## 2017-06-11 LAB — WET PREP BY MOLECULAR PROBE
CANDIDA SPECIES: NOT DETECTED
Gardnerella vaginalis: NOT DETECTED
MICRO NUMBER:: 90423178
SPECIMEN QUALITY: ADEQUATE
Trichomonas vaginosis: NOT DETECTED

## 2017-06-14 ENCOUNTER — Telehealth: Payer: Self-pay | Admitting: Primary Care

## 2017-06-14 NOTE — Telephone Encounter (Signed)
Pt given results and documented in result note 

## 2017-06-14 NOTE — Telephone Encounter (Signed)
Copied from [id number]. Topic: Quick Communication - Lab Results >> Jun 14, 2017 10:22 AM Jacqualin Combes, CMA wrote: Called patient and left message regarding lab results. When patient returns call, triage nurse may disclose results.  [phone]

## 2017-06-20 ENCOUNTER — Telehealth: Payer: Self-pay | Admitting: Primary Care

## 2017-06-20 ENCOUNTER — Other Ambulatory Visit: Payer: Self-pay | Admitting: Primary Care

## 2017-06-20 DIAGNOSIS — G4709 Other insomnia: Secondary | ICD-10-CM

## 2017-06-20 DIAGNOSIS — F3130 Bipolar disorder, current episode depressed, mild or moderate severity, unspecified: Secondary | ICD-10-CM

## 2017-06-20 NOTE — Telephone Encounter (Signed)
Copied from [place] [id number]. Topic: Quick Communication - Rx Refill/Question >> Jun 20, 2017  1:07 PM Cleaster Corin, [place] wrote: Medication: sertraline (ZOLOFT) 50 MG tablet [[id number]]  Has the patient contacted their pharmacy? yes (Agent: If no, request that the patient contact the pharmacy for the refill.) Preferred Pharmacy (with phone number or street name): [place], [place] - [address] [place] [address] Phone: [phone] Fax: [phone]   Agent: Please be advised that RX refills may take up to 3 business days. We ask that you follow-up with your pharmacy.

## 2017-06-20 NOTE — Telephone Encounter (Signed)
Pt requesting refill of Sertraline(Zoloft) 50mg  tablet. Medication previously filled by another provider.   LOV addressing this medication was on 12/24/16  PCP: Tera Helper  [facility]     [place]

## 2017-06-21 ENCOUNTER — Other Ambulatory Visit: Payer: Self-pay | Admitting: Primary Care

## 2017-06-21 DIAGNOSIS — Z1231 Encounter for screening mammogram for malignant neoplasm of breast: Secondary | ICD-10-CM

## 2017-06-21 MED ORDER — TRAZODONE HCL 150 MG PO TABS: 150.0000 mg | ORAL_TABLET | Freq: Every day | ORAL | 2 refills | Status: DC

## 2017-06-21 NOTE — Telephone Encounter (Signed)
Noted, refills sent to pharmacy. 

## 2017-06-21 NOTE — Telephone Encounter (Signed)
See refill request from 06/20/17; now pt requesting refill trazodone. Last refilled # 30 on 04/08/15 and pt last seen for med mgt on 04/15/17.Please advise.

## 2017-06-21 NOTE — Telephone Encounter (Signed)
Noted, refill sent to pharmacy. 

## 2017-06-21 NOTE — Telephone Encounter (Signed)
Pt checking on status of refill and also requesting refill on traZODone (DESYREL) 150 MG tablet. Pt is out of both medications .

## 2017-06-22 ENCOUNTER — Ambulatory Visit: Payer: BLUE CROSS/BLUE SHIELD | Admitting: Primary Care

## 2017-06-22 ENCOUNTER — Encounter: Payer: Self-pay | Admitting: Primary Care

## 2017-06-22 VITALS — BP 116/66 | HR 65 | Temp 97.8°F | Ht 60.0 in | Wt 121.0 lb

## 2017-06-22 DIAGNOSIS — M722 Plantar fascial fibromatosis: Secondary | ICD-10-CM | POA: Diagnosis not present

## 2017-06-22 DIAGNOSIS — G43701 Chronic migraine without aura, not intractable, with status migrainosus: Secondary | ICD-10-CM

## 2017-06-22 MED ORDER — MELOXICAM 15 MG PO TABS: 15.0000 mg | ORAL_TABLET | Freq: Every day | ORAL | 0 refills | Status: DC

## 2017-06-22 NOTE — Progress Notes (Signed)
Subjective:    Patient ID: Tanya Nguyen, female    DOB: 05-02-1965, 52 y.o.   MRN: [id number]  HPI Tanya Nguyen is a 52 y.o. female who presents today for a follow-up on her headaches. She was recently started on propanolol for migraine prevention. No headaches since starting medicine. Denies any N/V, lightheadness, dizziness, or chest pain.   Swelling is both hands with numbness and tingling that is all the time. Started about 2 weeks ago when starting propanolol. Swelling does not go away throughout night and sometimes have aching throughout day. Has not taken anything for symptom relief  She also is complaining of Bilateral foot pain that is constant burning feeling. She works at [employer] that   Review of Systems  Constitutional: Negative for activity change, chills and fever.  Respiratory: Negative for chest tightness and shortness of breath.   Cardiovascular: Negative for palpitations and leg swelling.  Musculoskeletal: Negative for arthralgias, gait problem and myalgias.      Past Medical History:  Diagnosis Date  . Bleeding duodenal ulcer   . Depression   . GERD (gastroesophageal reflux disease)   . Hypothyroidism   . Kidney stones   . Migraine headache    Past Surgical History:  Procedure Laterality Date  . ABDOMINAL HYSTERECTOMY  2004   partial  . CHOLECYSTECTOMY    . TOOTH EXTRACTION     Social History   Socioeconomic History  . Marital status: Married    Spouse name: Not on file  . Number of children: Not on file  . Years of education: Not on file  . Highest education level: Not on file  Occupational History  . Not on file  Social Needs  . Financial resource strain: Not on file  . Food insecurity:    Worry: Not on file    Inability: Not on file  . Transportation needs:    Medical: Not on file    Non-medical: Not on file  Tobacco Use  . Smoking status: Never Smoker  . Smokeless tobacco: Never Used  Substance and Sexual Activity  . Alcohol use: Yes      Alcohol/week: 0.0 oz  . Drug use: No  . Sexual activity: Yes    Birth control/protection: None  Lifestyle  . Physical activity:    Days per week: Not on file    Minutes per session: Not on file  . Stress: Not on file  Relationships  . Social connections:    Talks on phone: Not on file    Gets together: Not on file    Attends religious service: Not on file    Active member of club or organization: Not on file    Attends meetings of clubs or organizations: Not on file    Relationship status: Not on file  . Intimate partner violence:    Fear of current or ex partner: Not on file    Emotionally abused: Not on file    Physically abused: Not on file    Forced sexual activity: Not on file  Other Topics Concern  . Not on file  Social History Narrative  . Not on file   Family History  Problem Relation Age of Onset  . Cancer Mother   . Breast cancer Mother 43  . Cancer Father   . Alcohol abuse Sister   . Bipolar disorder Sister   . Alcohol abuse Maternal Grandmother   . Stroke Maternal Grandmother   . Stroke Paternal Grandmother    Current  Outpatient Medications on File Prior to Visit  Medication Sig Dispense Refill  . alum & mag hydroxide-simeth (MAALOX REGULAR STRENGTH) 200-200-20 MG/5ML suspension Take 15 mLs by mouth every 6 (six) hours as needed for indigestion or heartburn. 355 mL 0  . cetirizine (ZYRTEC) 10 MG tablet Take 10 mg by mouth daily as needed for allergies.     . cyanocobalamin (,VITAMIN B-12,) 1000 MCG/ML injection Inject into the skin once monthly for 6 months for low vitamin B 12. 3 mL 1  . levothyroxine (SYNTHROID, LEVOTHROID) 100 MCG tablet TAKE ONE TABLET DAILY BEFORE BREAKFAST. 30 tablet 11  . Linaclotide (LINZESS) 145 MCG CAPS capsule Take 1 capsule (145 mcg total) by mouth daily. 30 capsule 3  . pantoprazole (PROTONIX) 40 MG tablet Take 1 tablet by mouth every morning for heartburn. 90 tablet 1  . propranolol ER (INDERAL LA) 80 MG 24 hr capsule Take 1  capsule by mouth once daily for migraine prevention. 90 capsule 0  . ranitidine (ZANTAC) 150 MG tablet Take 1 tablet by mouth at bedtime as needed for heartburn. 90 tablet 0  . sertraline (ZOLOFT) 50 MG tablet TAKE 1 TABLET BY MOUTH ONCE A DAY 90 tablet 2  . SUMAtriptan (IMITREX) 25 MG tablet TAKE 1 TABLET EVERY 2 HOURS AS NEEDED FOR MIGRAINE, MAY REPEAT IN 2 HOURS IF HEADACHE PERSISTS OR RECURS 10 tablet 3  . SYRINGE-NEEDLE, DISP, 3 ML (B-D 3CC LUER-LOK SYR 25GX1") 25G X 1" 3 ML MISC Use as instructed to injection B12 every 30 days 10 each 0  . traZODone (DESYREL) 150 MG tablet Take 1 tablet (150 mg total) by mouth at bedtime. 90 tablet 2  . Vitamin D, Ergocalciferol, (DRISDOL) 50000 units CAPS capsule Take 1 capsule by mouth once weekly for 12 weeks. 12 capsule 0  . cyclobenzaprine (FLEXERIL) 5 MG tablet Take 1 tablet (5 mg total) by mouth 3 (three) times daily as needed for muscle spasms. (Patient not taking: Reported on 06/22/2017) 30 tablet 1   No current facility-administered medications on file prior to visit.     Objective:   Physical Exam  Constitutional: She appears well-nourished. No distress.  Cardiovascular: Normal rate, regular rhythm, intact distal pulses and normal pulses. Exam reveals no gallop and no friction rub.  No murmur heard. Negative Modified Allen's test  Pulmonary/Chest: Effort normal and breath sounds normal. No respiratory distress.  Musculoskeletal:  Negative for Tinel and Phalen  Neurological: She is alert. She has normal strength. No sensory deficit.   BP 116/66   Pulse 65   Temp 97.8 F (36.6 C) (Oral)   Ht 5' (1.524 m)   Wt 121 lb (54.9 kg)   SpO2 99%   BMI 23.63 kg/m      Assessment & Plan:  1. Chronic migraine without aura with status migrainosus, not intractable - Continue to take propanolol and if symptoms do not subside in next 2 weeks then will switch  2. Plantar fascial fibromatosis of both feet - meloxicam (MOBIC) 15 MG tablet; Take 1  tablet (15 mg total) by mouth daily. As needed for pain  Dispense: 30 tablet; Refill: 0  Denita Lung, RN, Adult-Geriatric Nurse Practitioner Student

## 2017-06-22 NOTE — Progress Notes (Signed)
Subjective:    Patient ID: Tanya Nguyen, female    DOB: 10-Jun-1965, 52 y.o.   MRN: [id number]  HPI  Mr. Chesterfield is a 52 year old female who presents today for follow up of headaches, blood pressure and a chief complaint of foot pain.  1) Migraines/Recurrent Headaches: Recurrent over the last several months with numerous office visits for acute treatment. During her last visit she was initiated on propranolol ER 80 mg.   BP Readings from Last 3 Encounters:  06/22/17 116/66  06/10/17 120/72  05/16/17 120/70   Since her last visit she's not had any migraines or headaches but she has noticed hand swelling and tingling. She noticed the swelling to her hands as she cannot put on her rings. The tingling is intermittent. She had neither of these symptoms prior to propranolol. She denies repetitive typing.  2) Foot Pain: Located to bilateral plantar feet that have been present for years. She's taken Ibuprofen, completed foot exercises, had professional orthotics made, evaluated by podiatry and underwent injections without improvement. She's never taken other medications.    Review of Systems  Cardiovascular:       Hand swelling  Musculoskeletal:       Bilateral plantar foot pain  Skin: Negative for color change.  Neurological:       Paraesthesias of hands        Past Medical History:  Diagnosis Date  . Bleeding duodenal ulcer   . Depression   . GERD (gastroesophageal reflux disease)   . Hypothyroidism   . Kidney stones   . Migraine headache      Social History   Socioeconomic History  . Marital status: Married    Spouse name: Not on file  . Number of children: Not on file  . Years of education: Not on file  . Highest education level: Not on file  Occupational History  . Not on file  Social Needs  . Financial resource strain: Not on file  . Food insecurity:    Worry: Not on file    Inability: Not on file  . Transportation needs:    Medical: Not on file    Non-medical:  Not on file  Tobacco Use  . Smoking status: Never Smoker  . Smokeless tobacco: Never Used  Substance and Sexual Activity  . Alcohol use: Yes    Alcohol/week: 0.0 oz  . Drug use: No  . Sexual activity: Yes    Birth control/protection: None  Lifestyle  . Physical activity:    Days per week: Not on file    Minutes per session: Not on file  . Stress: Not on file  Relationships  . Social connections:    Talks on phone: Not on file    Gets together: Not on file    Attends religious service: Not on file    Active member of club or organization: Not on file    Attends meetings of clubs or organizations: Not on file    Relationship status: Not on file  . Intimate partner violence:    Fear of current or ex partner: Not on file    Emotionally abused: Not on file    Physically abused: Not on file    Forced sexual activity: Not on file  Other Topics Concern  . Not on file  Social History Narrative  . Not on file    Past Surgical History:  Procedure Laterality Date  . ABDOMINAL HYSTERECTOMY  2004   partial  . CHOLECYSTECTOMY    .  TOOTH EXTRACTION      Family History  Problem Relation Age of Onset  . Cancer Mother   . Breast cancer Mother 82  . Cancer Father   . Alcohol abuse Sister   . Bipolar disorder Sister   . Alcohol abuse Maternal Grandmother   . Stroke Maternal Grandmother   . Stroke Paternal Grandmother     Allergies  Allergen Reactions  . Penicillins Shortness Of Breath, Diarrhea and Nausea And Vomiting    Has patient had a PCN reaction causing immediate rash, facial/tongue/throat swelling, SOB or lightheadedness with hypotension: yes Has patient had a PCN reaction causing severe rash involving mucus membranes or skin necrosis: no Has patient had a PCN reaction that required hospitalization no Has patient had a PCN reaction occurring within the last 10 years: about 10 years If all of the above answers are "NO", then may proceed with Cephalosporin use.   .  Sulfa Antibiotics Nausea And Vomiting    Other reaction(s): VOMITING    Current Outpatient Medications on File Prior to Visit  Medication Sig Dispense Refill  . alum & mag hydroxide-simeth (MAALOX REGULAR STRENGTH) 200-200-20 MG/5ML suspension Take 15 mLs by mouth every 6 (six) hours as needed for indigestion or heartburn. 355 mL 0  . cetirizine (ZYRTEC) 10 MG tablet Take 10 mg by mouth daily as needed for allergies.     . cyanocobalamin (,VITAMIN B-12,) 1000 MCG/ML injection Inject into the skin once monthly for 6 months for low vitamin B 12. 3 mL 1  . levothyroxine (SYNTHROID, LEVOTHROID) 100 MCG tablet TAKE ONE TABLET DAILY BEFORE BREAKFAST. 30 tablet 11  . Linaclotide (LINZESS) 145 MCG CAPS capsule Take 1 capsule (145 mcg total) by mouth daily. 30 capsule 3  . pantoprazole (PROTONIX) 40 MG tablet Take 1 tablet by mouth every morning for heartburn. 90 tablet 1  . propranolol ER (INDERAL LA) 80 MG 24 hr capsule Take 1 capsule by mouth once daily for migraine prevention. 90 capsule 0  . ranitidine (ZANTAC) 150 MG tablet Take 1 tablet by mouth at bedtime as needed for heartburn. 90 tablet 0  . sertraline (ZOLOFT) 50 MG tablet TAKE 1 TABLET BY MOUTH ONCE A DAY 90 tablet 2  . SUMAtriptan (IMITREX) 25 MG tablet TAKE 1 TABLET EVERY 2 HOURS AS NEEDED FOR MIGRAINE, MAY REPEAT IN 2 HOURS IF HEADACHE PERSISTS OR RECURS 10 tablet 3  . SYRINGE-NEEDLE, DISP, 3 ML (B-D 3CC LUER-LOK SYR 25GX1") 25G X 1" 3 ML MISC Use as instructed to injection B12 every 30 days 10 each 0  . traZODone (DESYREL) 150 MG tablet Take 1 tablet (150 mg total) by mouth at bedtime. 90 tablet 2  . Vitamin D, Ergocalciferol, (DRISDOL) 50000 units CAPS capsule Take 1 capsule by mouth once weekly for 12 weeks. 12 capsule 0  . cyclobenzaprine (FLEXERIL) 5 MG tablet Take 1 tablet (5 mg total) by mouth 3 (three) times daily as needed for muscle spasms. (Patient not taking: Reported on 06/22/2017) 30 tablet 1   No current  facility-administered medications on file prior to visit.     BP 116/66   Pulse 65   Temp 97.8 F (36.6 C) (Oral)   Ht 5' (1.524 m)   Wt 121 lb (54.9 kg)   SpO2 99%   BMI 23.63 kg/m    Objective:   Physical Exam  Constitutional: She appears well-nourished.  Neck: Neck supple.  Cardiovascular: Normal rate and regular rhythm.  Pulses:      Radial pulses  are 2+ on the right side, and 2+ on the left side.  Pulmonary/Chest: Effort normal and breath sounds normal.  Neurological:  Negative Tinel and Phalen's sign.  Skin: Skin is warm and dry.          Assessment & Plan:

## 2017-06-22 NOTE — Assessment & Plan Note (Signed)
No migraines or headaches since last visit. Suspect hand swelling and tingling to be secondary to side effects of propranolol.   Discussed options of continuing propranolol vs trying another medication, she'd like to stick with propranolol for another several weeks to see if side effects dissipate. This is very reasonable, she will update in a few weeks.

## 2017-06-22 NOTE — Patient Instructions (Addendum)
Lets continue the Propanolol for migraine and see of the numbness and tingling subsides. Please call me in 2 weeks if these symptoms persist.  We have sent in a prescription for Meloxicam (Mobic) for your foot pain. Take 1 tablet daily as needed for pain. Do not take any Ibuprofen, naproxen, Aleve, Motrin, Advil, Aleve with this medication.   It has been a pleasure seeing you today. Denita Lung, RN, Adult-Geriatric Nurse Practitioner Student and Allie Bossier, AGNP  Meloxicam tablets What is this medicine? MELOXICAM (mel OX i cam) is a non-steroidal anti-inflammatory drug (NSAID). It is used to reduce swelling and to treat pain. It may be used for osteoarthritis, rheumatoid arthritis, or juvenile rheumatoid arthritis. This medicine may be used for other purposes; ask your health care provider or pharmacist if you have questions. COMMON BRAND NAME(S): Mobic What should I tell my health care provider before I take this medicine? They need to know if you have any of these conditions: -bleeding disorders -cigarette smoker -coronary artery bypass graft (CABG) surgery within the past 2 weeks -drink more than 3 alcohol-containing drinks per day -heart disease -high blood pressure -history of stomach bleeding -kidney disease -liver disease -lung or breathing disease, like asthma -stomach or intestine problems -an unusual or allergic reaction to meloxicam, aspirin, other NSAIDs, other medicines, foods, dyes, or preservatives -pregnant or trying to get pregnant -breast-feeding How should I use this medicine? Take this medicine by mouth with a full glass of water. Follow the directions on the prescription label. You can take it with or without food. If it upsets your stomach, take it with food. Take your medicine at regular intervals. Do not take it more often than directed. Do not stop taking except on your doctor's advice. A special MedGuide will be given to you by the pharmacist with each  prescription and refill. Be sure to read this information carefully each time. Talk to your pediatrician regarding the use of this medicine in children. While this drug may be prescribed for selected conditions, precautions do apply. Patients over 60 years old may have a stronger reaction and need a smaller dose. Overdosage: If you think you have taken too much of this medicine contact a poison control center or emergency room at once. NOTE: This medicine is only for you. Do not share this medicine with others. What if I miss a dose? If you miss a dose, take it as soon as you can. If it is almost time for your next dose, take only that dose. Do not take double or extra doses. What may interact with this medicine? Do not take this medicine with any of the following medications: -cidofovir -ketorolac This medicine may also interact with the following medications: -aspirin and aspirin-like medicines -certain medicines for blood pressure, heart disease, irregular heart beat -certain medicines for depression, anxiety, or psychotic disturbances -certain medicines that treat or prevent blood clots like warfarin, enoxaparin, dalteparin, apixaban, dabigatran, rivaroxaban -cyclosporine -digoxin -diuretics -methotrexate -other NSAIDs, medicines for pain and inflammation, like ibuprofen and naproxen -pemetrexed This list may not describe all possible interactions. Give your health care provider a list of all the medicines, herbs, non-prescription drugs, or dietary supplements you use. Also tell them if you smoke, drink alcohol, or use illegal drugs. Some items may interact with your medicine. What should I watch for while using this medicine? Tell your doctor or healthcare professional if your symptoms do not start to get better or if they get worse. Do not take other  medicines that contain aspirin, ibuprofen, or naproxen with this medicine. Side effects such as stomach upset, nausea, or ulcers may be  more likely to occur. Many medicines available without a prescription should not be taken with this medicine. This medicine can cause ulcers and bleeding in the stomach and intestines at any time during treatment. This can happen with no warning and may cause death. There is increased risk with taking this medicine for a long time. Smoking, drinking alcohol, older age, and poor health can also increase risks. Call your doctor right away if you have stomach pain or blood in your vomit or stool. This medicine does not prevent heart attack or stroke. In fact, this medicine may increase the chance of a heart attack or stroke. The chance may increase with longer use of this medicine and in people who have heart disease. If you take aspirin to prevent heart attack or stroke, talk with your doctor or health care professional. What side effects may I notice from receiving this medicine? Side effects that you should report to your doctor or health care professional as soon as possible: -allergic reactions like skin rash, itching or hives, swelling of the face, lips, or tongue -nausea, vomiting -signs and symptoms of a blood clot such as breathing problems; changes in vision; chest pain; severe, sudden headache; pain, swelling, warmth in the leg; trouble speaking; sudden numbness or weakness of the face, arm, or leg -signs and symptoms of bleeding such as bloody or black, tarry stools; red or dark-brown urine; spitting up blood or brown material that looks like coffee grounds; red spots on the skin; unusual bruising or bleeding from the eye, gums, or nose -signs and symptoms of liver injury like dark yellow or brown urine; general ill feeling or flu-like symptoms; light-colored stools; loss of appetite; nausea; right upper belly pain; unusually weak or tired; yellowing of the eyes or skin -signs and symptoms of stroke like changes in vision; confusion; trouble speaking or understanding; severe headaches; sudden  numbness or weakness of the face, arm, or leg; trouble walking; dizziness; loss of balance or coordination Side effects that usually do not require medical attention (report to your doctor or health care professional if they continue or are bothersome): -constipation -diarrhea -gas This list may not describe all possible side effects. Call your doctor for medical advice about side effects. You may report side effects to FDA at 1-800-FDA-1088. Where should I keep my medicine? Keep out of the reach of children. Store at room temperature between 15 and 30 degrees C (59 and 86 degrees F). Throw away any unused medicine after the expiration date. NOTE: This sheet is a summary. It may not cover all possible information. If you have questions about this medicine, talk to your doctor, pharmacist, or health care provider.  2018 Elsevier/Gold Standard (2015-03-26 19:28:16)

## 2017-06-22 NOTE — Assessment & Plan Note (Signed)
No improvement with orthotics, injections, other conservative treatment. Rx for Meloxicam sent to pharmacy, she will update.

## 2017-07-28 ENCOUNTER — Encounter

## 2017-07-28 ENCOUNTER — Encounter: Payer: Self-pay | Admitting: Primary Care

## 2017-07-28 ENCOUNTER — Ambulatory Visit: Payer: BLUE CROSS/BLUE SHIELD | Admitting: Primary Care

## 2017-07-28 VITALS — BP 120/68 | HR 86 | Temp 97.8°F | Ht 60.0 in | Wt 122.5 lb

## 2017-07-28 DIAGNOSIS — M545 Low back pain, unspecified: Secondary | ICD-10-CM | POA: Insufficient documentation

## 2017-07-28 DIAGNOSIS — M722 Plantar fascial fibromatosis: Secondary | ICD-10-CM

## 2017-07-28 DIAGNOSIS — E538 Deficiency of other specified B group vitamins: Secondary | ICD-10-CM

## 2017-07-28 DIAGNOSIS — E559 Vitamin D deficiency, unspecified: Secondary | ICD-10-CM | POA: Diagnosis not present

## 2017-07-28 DIAGNOSIS — G8929 Other chronic pain: Secondary | ICD-10-CM | POA: Diagnosis not present

## 2017-07-28 DIAGNOSIS — G43701 Chronic migraine without aura, not intractable, with status migrainosus: Secondary | ICD-10-CM | POA: Diagnosis not present

## 2017-07-28 LAB — VITAMIN D 25 HYDROXY (VIT D DEFICIENCY, FRACTURES): VITD: 34.39 ng/mL (ref 30.00–100.00)

## 2017-07-28 NOTE — Assessment & Plan Note (Signed)
Stopped propranolol two weeks ago, no migraine since early April 2019. Will continue to hold off propranolol given hand swelling.   Consider low dose Topamax if migraines return. She will update if hand swelling does not improve.

## 2017-07-28 NOTE — Progress Notes (Signed)
Subjective:    Patient ID: Tanya Nguyen, female    DOB: 04-10-1965, 52 y.o.   MRN: [id number]  HPI  Tanya Nguyen is a 52 year old female who presents today for follow up.  1) Migraines: Currently managed on Propranolol LA 80 mg and sumatriptan 25 mg. She was placed on Propranolol in early April 2019 for recurrent migraines. During her last visit one month ago she endorsed improvement in migraines and headaches but endorsed hand swelling. She decided to continue Propranolol given improvement in headaches.  Since her last visit her migraines are better, she's had no headaches since prior to her last visit. She stopped taking her propranolol two weeks ago due to continued hand swelling. She cannot get her rings on her fingers. She does work with her hands daily as she works at [employer].   2) Plantar Fasciitis: Long term history, once following with Podiatry and received injections and numerous other treatments without improvement. Last visit we initiated Meloxicam for her to use sparingly as needed and also referred to Podiatry for re-evaluation. She's noticed improvement with Meloxicam. She has an appointment with Podiatry next week.    3) Vitamin B 12 and D Deficiency: Currently managed on Vitamin B 12 injections which were initiated in January 2019. She's not had an injection since March 2019 as she forgot to pick it up from the pharmacy. She continues to feel fatigued and sluggish.   Vitamin D level of 15 in January 2019 so she was placed on 50,000 units once weekly for 12 weeks, she completed this regimen. She is not taking Vitamin D OTC.   4) Chronic Low Back Pain: Chronic for 15+ years. 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 She is currently taking cyclobenzaprine 5 mg sparingly and is typically cutting tablets in half. She is mostly taking Tylenol PRN.   Review of Systems  Constitutional: Positive for fatigue.  Respiratory: Negative for shortness of  breath.   Cardiovascular: Negative for chest pain.  Musculoskeletal:       Chronic lower back pain and plantar fasciitis   Neurological: Negative for headaches.       Past Medical History:  Diagnosis Date  . Bleeding duodenal ulcer   . Depression   . GERD (gastroesophageal reflux disease)   . Hypothyroidism   . Kidney stones   . Migraine headache      Social History   Socioeconomic History  . Marital status: Married    Spouse name: Not on file  . Number of children: Not on file  . Years of education: Not on file  . Highest education level: Not on file  Occupational History  . Not on file  Social Needs  . Financial resource strain: Not on file  . Food insecurity:    Worry: Not on file    Inability: Not on file  . Transportation needs:    Medical: Not on file    Non-medical: Not on file  Tobacco Use  . Smoking status: Never Smoker  . Smokeless tobacco: Never Used  Substance and Sexual Activity  . Alcohol use: Yes    Alcohol/week: 0.0 oz  . Drug use: No  . Sexual activity: Yes    Birth control/protection: None  Lifestyle  . Physical activity:    Days per week: Not on file    Minutes per session: Not on file  . Stress: Not on file  Relationships  . Social connections:    Talks  on phone: Not on file    Gets together: Not on file    Attends religious service: Not on file    Active member of club or organization: Not on file    Attends meetings of clubs or organizations: Not on file    Relationship status: Not on file  . Intimate partner violence:    Fear of current or ex partner: Not on file    Emotionally abused: Not on file    Physically abused: Not on file    Forced sexual activity: Not on file  Other Topics Concern  . Not on file  Social History Narrative  . Not on file    Past Surgical History:  Procedure Laterality Date  . ABDOMINAL HYSTERECTOMY  2004   partial  . CHOLECYSTECTOMY    . TOOTH EXTRACTION      Family History  Problem Relation  Age of Onset  . Cancer Mother   . Breast cancer Mother 39  . Cancer Father   . Alcohol abuse Sister   . Bipolar disorder Sister   . Alcohol abuse Maternal Grandmother   . Stroke Maternal Grandmother   . Stroke Paternal Grandmother     Allergies  Allergen Reactions  . Penicillins Shortness Of Breath, Diarrhea and Nausea And Vomiting    Has patient had a PCN reaction causing immediate rash, facial/tongue/throat swelling, SOB or lightheadedness with hypotension: yes Has patient had a PCN reaction causing severe rash involving mucus membranes or skin necrosis: no Has patient had a PCN reaction that required hospitalization no Has patient had a PCN reaction occurring within the last 10 years: about 10 years If all of the above answers are "NO", then may proceed with Cephalosporin use.   . Sulfa Antibiotics Nausea And Vomiting    Other reaction(s): VOMITING    Current Outpatient Medications on File Prior to Visit  Medication Sig Dispense Refill  . cetirizine (ZYRTEC) 10 MG tablet Take 10 mg by mouth daily as needed for allergies.     . cyanocobalamin (,VITAMIN B-12,) 1000 MCG/ML injection Inject into the skin once monthly for 6 months for low vitamin B 12. 3 mL 1  . cyclobenzaprine (FLEXERIL) 5 MG tablet Take 1 tablet (5 mg total) by mouth 3 (three) times daily as needed for muscle spasms. 30 tablet 1  . levothyroxine (SYNTHROID, LEVOTHROID) 100 MCG tablet TAKE ONE TABLET DAILY BEFORE BREAKFAST. 30 tablet 11  . meloxicam (MOBIC) 15 MG tablet Take 1 tablet (15 mg total) by mouth daily. As needed for pain 30 tablet 0  . pantoprazole (PROTONIX) 40 MG tablet Take 1 tablet by mouth every morning for heartburn. 90 tablet 1  . ranitidine (ZANTAC) 150 MG tablet Take 1 tablet by mouth at bedtime as needed for heartburn. 90 tablet 0  . sertraline (ZOLOFT) 50 MG tablet TAKE 1 TABLET BY MOUTH ONCE A DAY 90 tablet 2  . SUMAtriptan (IMITREX) 25 MG tablet TAKE 1 TABLET EVERY 2 HOURS AS NEEDED FOR  MIGRAINE, MAY REPEAT IN 2 HOURS IF HEADACHE PERSISTS OR RECURS 10 tablet 3  . SYRINGE-NEEDLE, DISP, 3 ML (B-D 3CC LUER-LOK SYR 25GX1") 25G X 1" 3 ML MISC Use as instructed to injection B12 every 30 days 10 each 0  . traZODone (DESYREL) 150 MG tablet Take 1 tablet (150 mg total) by mouth at bedtime. 90 tablet 2   No current facility-administered medications on file prior to visit.     BP 120/68   Pulse 86   Temp  97.8 F (36.6 C) (Oral)   Ht 5' (1.524 m)   Wt 122 lb 8 oz (55.6 kg)   SpO2 97%   BMI 23.92 kg/m    Objective:   Physical Exam  Constitutional: She appears well-nourished.  Cardiovascular: Normal rate and regular rhythm.  Respiratory: Effort normal and breath sounds normal.  Musculoskeletal:  Swelling noted to generalized fingers bilaterally  Skin: Skin is warm and dry.  Psychiatric: She has a normal mood and affect.           Assessment & Plan:

## 2017-07-28 NOTE — Assessment & Plan Note (Signed)
Doing well on PRN Flexeril, continue same. 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

## 2017-07-28 NOTE — Assessment & Plan Note (Signed)
No recent B 12 injection as she's forgotten. Discussed to inject monthly as directed. Will repeat B12 labs in 3 months.

## 2017-07-28 NOTE — Assessment & Plan Note (Signed)
Completed course of Vitamin D as directed. Repeat Vitamin D labs pending.

## 2017-07-28 NOTE — Patient Instructions (Addendum)
Do not take propranolol due to hand swelling, this should improve within the next two weeks.  Stop by the lab prior to leaving today. I will notify you of your results once received.   Make sure to inject B 12 monthly, every month as discussed.   Use the meloxicam sparingly for foot pain, especially given your history of stomach ulcers. Follow up with the foot doctor as scheduled.  Please schedule a physical with me in 3 months. You may also schedule a lab only appointment 3-4 days prior. We will discuss your lab results in detail during your physical.  It was a pleasure to see you today!

## 2017-07-28 NOTE — Assessment & Plan Note (Signed)
Improved on PRN meloxicam, continue same. She will see podiatry next week. Continue pantoprazole given history of gastric ulcers.

## 2017-08-02 ENCOUNTER — Encounter: Payer: Self-pay | Admitting: Podiatry

## 2017-08-02 ENCOUNTER — Ambulatory Visit: Payer: BLUE CROSS/BLUE SHIELD | Admitting: Podiatry

## 2017-08-02 DIAGNOSIS — G609 Hereditary and idiopathic neuropathy, unspecified: Secondary | ICD-10-CM

## 2017-08-02 DIAGNOSIS — M722 Plantar fascial fibromatosis: Secondary | ICD-10-CM | POA: Diagnosis not present

## 2017-08-02 MED ORDER — MELOXICAM 15 MG PO TABS: 15.0000 mg | ORAL_TABLET | Freq: Every day | ORAL | 1 refills | Status: AC

## 2017-08-02 MED ORDER — GABAPENTIN 100 MG PO CAPS: 100.0000 mg | ORAL_CAPSULE | Freq: Three times a day (TID) | ORAL | 3 refills | Status: DC

## 2017-08-03 NOTE — Progress Notes (Signed)
   Subjective: 52 year old female presenting today with a chief complaint of burning pain to the bilateral heels that began three months ago. She reports associated redness of the areas. She states she believes she is having a plantar fasciitis flare up. She also reports intermittent numbness and tingling in the feet and toes and is concerned for neuropathy. Standing and walking increase all of her symptoms. She has been stretching and taking her mother's gabapentin for treatment. Patient is here for further evaluation and treatment.   Past Medical History:  Diagnosis Date  . Bleeding duodenal ulcer   . Depression   . GERD (gastroesophageal reflux disease)   . Hypothyroidism   . Kidney stones   . Migraine headache      Objective: Physical Exam General: The patient is alert and oriented x3 in no acute distress.  Dermatology: Skin is warm, dry and supple bilateral lower extremities. Negative for open lesions or macerations bilateral.   Vascular: Dorsalis Pedis and Posterior Tibial pulses palpable bilateral.  Capillary fill time is immediate to all digits.  Neurological: Epicritic and protective threshold intact bilateral.   Musculoskeletal: Tenderness to palpation to the plantar aspect of the bilateral heels along the plantar fascia. All other joints range of motion within normal limits bilateral. Strength 5/5 in all groups bilateral.    Assessment: 1. plantar fasciitis bilateral feet 2. Idiopathic peripheral neuropathy bilateral   Plan of Care:  1. Patient evaluated.  2. Injection of 0.5cc Celestone soluspan injected into the bilateral heels.  3. Prescription for Meloxicam provided to patient.  4. Prescription for Gabapentin 100 mg three times daily provided to patient.  5. Plantar fascial band(s) dispensed for bilateral plantar fasciitis. 6. Instructed patient regarding therapies and modalities at home to alleviate symptoms.  7. Return to clinic in 4 weeks.    Works on her  feet all day at [employer].   Edrick Kins, DPM [facility]  Dr. Edrick Kins, DPM    [address], [place] [address]                Office [phone]  Fax [phone]

## 2017-08-04 ENCOUNTER — Telehealth: Payer: Self-pay | Admitting: Podiatry

## 2017-08-04 MED ORDER — NONFORMULARY OR COMPOUNDED ITEM: 2 refills | Status: DC

## 2017-08-04 NOTE — Telephone Encounter (Signed)
I spoke with patient, she had questions regarding her plantar fasciitis and neuropathy.  All questions were answered and she verbalized understanding.

## 2017-08-04 NOTE — Telephone Encounter (Signed)
I was seen on Tuesday by Dr. Amalia Hailey. I would like to speak to the nurse about some foot pain I'm having. Also, an appointment about the burning? My number is [phone]. Thank you. Bye bye.

## 2017-08-25 ENCOUNTER — Ambulatory Visit: Payer: BLUE CROSS/BLUE SHIELD | Admitting: Family Medicine

## 2017-08-26 ENCOUNTER — Ambulatory Visit: Payer: BLUE CROSS/BLUE SHIELD | Admitting: Family Medicine

## 2017-08-26 ENCOUNTER — Encounter: Payer: Self-pay | Admitting: *Deleted

## 2017-08-26 ENCOUNTER — Encounter: Payer: Self-pay | Admitting: Family Medicine

## 2017-08-26 VITALS — BP 112/78 | HR 74 | Temp 97.9°F | Ht 60.0 in | Wt 116.0 lb

## 2017-08-26 DIAGNOSIS — K219 Gastro-esophageal reflux disease without esophagitis: Secondary | ICD-10-CM | POA: Diagnosis not present

## 2017-08-26 DIAGNOSIS — K264 Chronic or unspecified duodenal ulcer with hemorrhage: Secondary | ICD-10-CM | POA: Diagnosis not present

## 2017-08-26 MED ORDER — OMEPRAZOLE 40 MG PO CPDR: 40.0000 mg | DELAYED_RELEASE_CAPSULE | ORAL | 1 refills | Status: DC

## 2017-08-26 MED ORDER — RANITIDINE HCL 150 MG PO TABS: ORAL_TABLET | ORAL | 1 refills | Status: DC

## 2017-08-26 NOTE — Progress Notes (Signed)
Dr. Frederico Hamman T. Sheniece Ruggles, MD, [facility] [place], [address] Phone: [phone] Fax: [phone]  08/26/2017  Patient: Tanya Nguyen, MRN: [id number], DOB: Jul 16, 1965, 52 y.o.  Primary Physician:  Pleas Koch, NP   Chief Complaint  Patient presents with  . Abdominal Pain  . Emesis   Subjective:   Tanya Nguyen is a 52 y.o. very pleasant female patient who presents with the following:  Patient presents with some ongoing abdominal pain and has a history of duodenal ulcer. She also has a history of GERD.   4 years ago had some ulcers and then transferred to [facility], in ICU for about a week and had some get 2 PRBC. Greasy foods and citric acid makes it worse and throws up a fair amount. Was on an omeprazole before.  Wanted to go back on Omeprazole.   Past Medical History, Surgical History, Social History, Family History, Problem List, Medications, and Allergies have been reviewed and updated if relevant.  Patient Active Problem List   Diagnosis Date Noted  . Vitamin D deficiency 07/28/2017  . Vitamin B 12 deficiency 07/28/2017  . Chronic low back pain 07/28/2017  . Foul smelling urine 12/28/2016  . Plantar fascial fibromatosis of both feet 11/10/2016  . Bipolar I disorder, most recent episode depressed ([place]) 03/01/2015  . PTSD (post-traumatic stress disorder) 02/27/2015  . Constipation 04/02/2014  . Fatigue 12/13/2013  . Insomnia 08/30/2013  . GERD (gastroesophageal reflux disease)   . Migraines   . Hypothyroidism   . Bleeding duodenal ulcer     Past Medical History:  Diagnosis Date  . Bleeding duodenal ulcer   . Depression   . GERD (gastroesophageal reflux disease)   . Hypothyroidism   . Kidney stones   . Migraine headache     Past Surgical History:  Procedure Laterality Date  . ABDOMINAL HYSTERECTOMY  2004   partial  . CHOLECYSTECTOMY    . TOOTH EXTRACTION      Social History   Socioeconomic  History  . Marital status: Married    Spouse name: Not on file  . Number of children: Not on file  . Years of education: Not on file  . Highest education level: Not on file  Occupational History  . Not on file  Social Needs  . Financial resource strain: Not on file  . Food insecurity:    Worry: Not on file    Inability: Not on file  . Transportation needs:    Medical: Not on file    Non-medical: Not on file  Tobacco Use  . Smoking status: Never Smoker  . Smokeless tobacco: Never Used  Substance and Sexual Activity  . Alcohol use: Yes    Alcohol/week: 0.0 oz  . Drug use: No  . Sexual activity: Yes    Birth control/protection: None  Lifestyle  . Physical activity:    Days per week: Not on file    Minutes per session: Not on file  . Stress: Not on file  Relationships  . Social connections:    Talks on phone: Not on file    Gets together: Not on file    Attends religious service: Not on file    Active member of club or organization: Not on file    Attends meetings of clubs or organizations: Not on file    Relationship status: Not on file  . Intimate partner violence:    Fear of current or ex partner:  Not on file    Emotionally abused: Not on file    Physically abused: Not on file    Forced sexual activity: Not on file  Other Topics Concern  . Not on file  Social History Narrative  . Not on file    Family History  Problem Relation Age of Onset  . Cancer Mother   . Breast cancer Mother 62  . Cancer Father   . Alcohol abuse Sister   . Bipolar disorder Sister   . Alcohol abuse Maternal Grandmother   . Stroke Maternal Grandmother   . Stroke Paternal Grandmother     Allergies  Allergen Reactions  . Penicillins Shortness Of Breath, Diarrhea and Nausea And Vomiting    Has patient had a PCN reaction causing immediate rash, facial/tongue/throat swelling, SOB or lightheadedness with hypotension: yes Has patient had a PCN reaction causing severe rash involving mucus  membranes or skin necrosis: no Has patient had a PCN reaction that required hospitalization no Has patient had a PCN reaction occurring within the last 10 years: about 10 years If all of the above answers are "NO", then may proceed with Cephalosporin use.   . Sulfa Antibiotics Nausea And Vomiting    Other reaction(s): VOMITING    Medication list reviewed and updated in full in [facility].   GEN: No acute illnesses, no fevers, chills. GI: No n/v/d, eating normally Pulm: No SOB Interactive and getting along well at home.  Otherwise, ROS is as per the HPI.  Objective:   Ht 5' (1.524 m)   Wt 116 lb (52.6 kg)   BMI 22.65 kg/m   GEN: WDWN, NAD, Non-toxic, A & O x 3 HEENT: Atraumatic, Normocephalic. Neck supple. No masses, No LAD. Ears and Nose: No external deformity. CV: RRR, No M/G/R. No JVD. No thrill. No extra heart sounds. PULM: CTA B, no wheezes, crackles, rhonchi. No retractions. No resp. distress. No accessory muscle use. ABD: S, NT, ND, + BS, No rebound, No HSM  EXTR: No c/c/e NEURO Normal gait.  PSYCH: Normally interactive. Conversant. Not depressed or anxious appearing.  Calm demeanor.   Laboratory and Imaging Data:  Assessment and Plan:   Gastroesophageal reflux disease without esophagitis - Plan: ranitidine (ZANTAC) 150 MG tablet  Bleeding duodenal ulcer   Patient's exam is completely benign.  She is at high risk for gastritis and potential ulcer.  I am going to change her to Prilosec 40 mg in the morning.  Also going to have her do Zantac 150 mg at nighttime for now.  She is going to let us know if her symptoms persist or worsen at all.  Follow-up: No follow-ups on file.  Meds ordered this encounter  Medications  . omeprazole (PRILOSEC) 40 MG capsule    Sig: Take 1 capsule (40 mg total) by mouth every morning.    Dispense:  90 capsule    Refill:  1  . ranitidine (ZANTAC) 150 MG tablet    Sig: Take 1 tablet by mouth at bedtime    Dispense:  90  tablet    Refill:  1   Signed,  Avarae Zwart T. Klara Stjames, MD   Allergies as of 08/26/2017      Reactions   Penicillins Shortness Of Breath, Diarrhea, Nausea And Vomiting   Has patient had a PCN reaction causing immediate rash, facial/tongue/throat swelling, SOB or lightheadedness with hypotension: yes Has patient had a PCN reaction causing severe rash involving mucus membranes or skin necrosis: no Has patient had a  PCN reaction that required hospitalization no Has patient had a PCN reaction occurring within the last 10 years: about 10 years If all of the above answers are "NO", then may proceed with Cephalosporin use.   Sulfa Antibiotics Nausea And Vomiting   Other reaction(s): VOMITING      Medication List        Accurate as of 08/26/17 11:59 PM. Always use your most recent med list.          cetirizine 10 MG tablet Commonly known as:  ZYRTEC Take 10 mg by mouth daily as needed for allergies.   cyanocobalamin 1000 MCG/ML injection Commonly known as:  (VITAMIN B-12) Inject into the skin once monthly for 6 months for low vitamin B 12.   cyclobenzaprine 5 MG tablet Commonly known as:  FLEXERIL Take 1 tablet (5 mg total) by mouth 3 (three) times daily as needed for muscle spasms.   gabapentin 100 MG capsule Commonly known as:  NEURONTIN Take 1 capsule (100 mg total) by mouth 3 (three) times daily.   levothyroxine 100 MCG tablet Commonly known as:  SYNTHROID, LEVOTHROID TAKE ONE TABLET DAILY BEFORE BREAKFAST.   meloxicam 15 MG tablet Commonly known as:  MOBIC Take 1 tablet (15 mg total) by mouth daily.   NONFORMULARY OR COMPOUNDED ITEM See pharmacy note   omeprazole 40 MG capsule Commonly known as:  PRILOSEC Take 1 capsule (40 mg total) by mouth every morning.   ranitidine 150 MG tablet Commonly known as:  ZANTAC Take 1 tablet by mouth at bedtime   sertraline 50 MG tablet Commonly known as:  ZOLOFT TAKE 1 TABLET BY MOUTH ONCE A DAY   SUMAtriptan 25 MG  tablet Commonly known as:  IMITREX TAKE 1 TABLET EVERY 2 HOURS AS NEEDED FOR MIGRAINE, MAY REPEAT IN 2 HOURS IF HEADACHE PERSISTS OR RECURS   SYRINGE-NEEDLE (DISP) 3 ML 25G X 1" 3 ML Misc Commonly known as:  B-D 3CC LUER-LOK SYR 25GX1" Use as instructed to injection B12 every 30 days   traZODone 150 MG tablet Commonly known as:  DESYREL Take 1 tablet (150 mg total) by mouth at bedtime.   Venlafaxine HCl 75 MG Tb24 Take by mouth.

## 2017-09-02 ENCOUNTER — Encounter: Payer: Self-pay | Admitting: Podiatry

## 2017-09-02 ENCOUNTER — Ambulatory Visit: Payer: BLUE CROSS/BLUE SHIELD | Admitting: Podiatry

## 2017-09-02 DIAGNOSIS — M722 Plantar fascial fibromatosis: Secondary | ICD-10-CM | POA: Diagnosis not present

## 2017-09-02 DIAGNOSIS — G609 Hereditary and idiopathic neuropathy, unspecified: Secondary | ICD-10-CM | POA: Diagnosis not present

## 2017-09-05 NOTE — Progress Notes (Signed)
   Subjective: 52 year old female presenting today for follow up evaluation of bilateral foot pain. She reports continue pain that has not improved since her previous visit. She reports associated redness of the plantar aspects of the feet. Taking gabapentin and meloxicam as well as the injections are helping to improve her symptoms. Patient is here for further evaluation and treatment.   Past Medical History:  Diagnosis Date  . Bleeding duodenal ulcer   . Depression   . GERD (gastroesophageal reflux disease)   . Hypothyroidism   . Kidney stones   . Migraine headache      Objective: Physical Exam General: The patient is alert and oriented x3 in no acute distress.  Dermatology: Skin is warm, dry and supple bilateral lower extremities. Negative for open lesions or macerations bilateral.   Vascular: Dorsalis Pedis and Posterior Tibial pulses palpable bilateral.  Capillary fill time is immediate to all digits.  Neurological: Epicritic and protective threshold intact bilateral.   Musculoskeletal: Tenderness to palpation to the plantar aspect of the bilateral heels along the plantar fascia. All other joints range of motion within normal limits bilateral. Strength 5/5 in all groups bilateral.    Assessment: 1. plantar fasciitis bilateral feet 2. Idiopathic peripheral neuropathy bilateral   Plan of Care:  1. Patient evaluated.  2. Injection of 0.5cc Celestone soluspan injected into the bilateral heels.  3. Continue taking Meloxicam.  4. Continue taking gabapentin.  5. Discontinue using plantar fascial braces due to irritation.  6. Continue wearing Brooks shoes. Do not go barefoot.  7. Return to clinic in 4 weeks.    Works on her feet all day at [employer].   Edrick Kins, DPM [facility]  Dr. Edrick Kins, DPM    [address], [place] [address]                Office [phone]  Fax [phone]

## 2017-09-20 ENCOUNTER — Encounter: Payer: Self-pay | Admitting: Internal Medicine

## 2017-09-20 ENCOUNTER — Ambulatory Visit: Payer: BLUE CROSS/BLUE SHIELD | Admitting: Internal Medicine

## 2017-09-20 ENCOUNTER — Telehealth: Payer: Self-pay | Admitting: Podiatry

## 2017-09-20 VITALS — BP 116/70 | HR 87 | Temp 97.9°F | Wt 113.0 lb

## 2017-09-20 DIAGNOSIS — N898 Other specified noninflammatory disorders of vagina: Secondary | ICD-10-CM | POA: Diagnosis not present

## 2017-09-20 DIAGNOSIS — G43009 Migraine without aura, not intractable, without status migrainosus: Secondary | ICD-10-CM

## 2017-09-20 MED ORDER — KETOROLAC TROMETHAMINE 30 MG/ML IJ SOLN
30.0000 mg | Freq: Once | INTRAMUSCULAR | Status: AC
  Administered 2017-09-20: 30 mg via INTRAMUSCULAR

## 2017-09-20 NOTE — Telephone Encounter (Signed)
I need to speak to the nurse about my feet and something else. Please call me back at [phone]. Bye bye.

## 2017-09-20 NOTE — Addendum Note (Signed)
Addended by: Lindalou Hose Y on: 09/20/2017 01:00 PM   Modules accepted: Orders

## 2017-09-20 NOTE — Patient Instructions (Signed)

## 2017-09-20 NOTE — Progress Notes (Signed)
Subjective:    Patient ID: Tanya Nguyen, female    DOB: 22-Sep-1965, 52 y.o.   MRN: [id number]  HPI  Pt presents to the clinic today with c/o vaginal concerns. She reports persistent discharge (cream/yellow discharge), itching. She denies odor or abnormal bleeding. She has had a hysterectomy.  She also reports she woke up today with a migraine. The migraine is located on the right side. She describes the pain as sharp, stabbing. She reports associate lightheaded, sensitivity to light. She denies visual changes, sensitivity to sound, nausea or vomiting. She takes Imitrex as needed but did not take it because it makes her drowsy.  Review of Systems      Past Medical History:  Diagnosis Date  . Bleeding duodenal ulcer   . Depression   . GERD (gastroesophageal reflux disease)   . Hypothyroidism   . Kidney stones   . Migraine headache     Current Outpatient Medications  Medication Sig Dispense Refill  . cetirizine (ZYRTEC) 10 MG tablet Take 10 mg by mouth daily as needed for allergies.     . cyanocobalamin (,VITAMIN B-12,) 1000 MCG/ML injection Inject into the skin once monthly for 6 months for low vitamin B 12. 3 mL 1  . cyclobenzaprine (FLEXERIL) 5 MG tablet Take 1 tablet (5 mg total) by mouth 3 (three) times daily as needed for muscle spasms. 30 tablet 1  . gabapentin (NEURONTIN) 100 MG capsule Take 1 capsule (100 mg total) by mouth 3 (three) times daily. 90 capsule 3  . levothyroxine (SYNTHROID, LEVOTHROID) 100 MCG tablet TAKE ONE TABLET DAILY BEFORE BREAKFAST. 30 tablet 11  . NONFORMULARY OR COMPOUNDED ITEM See pharmacy note 120 each 2  . omeprazole (PRILOSEC) 40 MG capsule Take 1 capsule (40 mg total) by mouth every morning. 90 capsule 1  . ranitidine (ZANTAC) 150 MG tablet Take 1 tablet by mouth at bedtime 90 tablet 1  . sertraline (ZOLOFT) 50 MG tablet TAKE 1 TABLET BY MOUTH ONCE A DAY 90 tablet 2  . SUMAtriptan (IMITREX) 25 MG tablet TAKE 1 TABLET EVERY 2 HOURS AS NEEDED FOR  MIGRAINE, MAY REPEAT IN 2 HOURS IF HEADACHE PERSISTS OR RECURS 10 tablet 3  . SYRINGE-NEEDLE, DISP, 3 ML (B-D 3CC LUER-LOK SYR 25GX1") 25G X 1" 3 ML MISC Use as instructed to injection B12 every 30 days 10 each 0  . traZODone (DESYREL) 150 MG tablet Take 1 tablet (150 mg total) by mouth at bedtime. 90 tablet 2  . Venlafaxine HCl 75 MG TB24 Take by mouth.     No current facility-administered medications for this visit.     Allergies  Allergen Reactions  . Penicillins Shortness Of Breath, Diarrhea and Nausea And Vomiting    Has patient had a PCN reaction causing immediate rash, facial/tongue/throat swelling, SOB or lightheadedness with hypotension: yes Has patient had a PCN reaction causing severe rash involving mucus membranes or skin necrosis: no Has patient had a PCN reaction that required hospitalization no Has patient had a PCN reaction occurring within the last 10 years: about 10 years If all of the above answers are "NO", then may proceed with Cephalosporin use.   . Sulfa Antibiotics Nausea And Vomiting    Other reaction(s): VOMITING    Family History  Problem Relation Age of Onset  . Cancer Mother   . Breast cancer Mother 48  . Cancer Father   . Alcohol abuse Sister   . Bipolar disorder Sister   . Alcohol abuse Maternal Grandmother   .  Stroke Maternal Grandmother   . Stroke Paternal Grandmother     Social History   Socioeconomic History  . Marital status: Married    Spouse name: Not on file  . Number of children: Not on file  . Years of education: Not on file  . Highest education level: Not on file  Occupational History  . Not on file  Social Needs  . Financial resource strain: Not on file  . Food insecurity:    Worry: Not on file    Inability: Not on file  . Transportation needs:    Medical: Not on file    Non-medical: Not on file  Tobacco Use  . Smoking status: Never Smoker  . Smokeless tobacco: Never Used  Substance and Sexual Activity  . Alcohol use:  Yes    Alcohol/week: 0.0 oz  . Drug use: No  . Sexual activity: Yes    Birth control/protection: None  Lifestyle  . Physical activity:    Days per week: Not on file    Minutes per session: Not on file  . Stress: Not on file  Relationships  . Social connections:    Talks on phone: Not on file    Gets together: Not on file    Attends religious service: Not on file    Active member of club or organization: Not on file    Attends meetings of clubs or organizations: Not on file    Relationship status: Not on file  . Intimate partner violence:    Fear of current or ex partner: Not on file    Emotionally abused: Not on file    Physically abused: Not on file    Forced sexual activity: Not on file  Other Topics Concern  . Not on file  Social History Narrative  . Not on file     Constitutional: Pt reports headache. Denies fever, malaise, fatigue, or abrupt weight changes.  Respiratory: Denies difficulty breathing, shortness of breath, cough or sputum production.   Cardiovascular: Denies chest pain, chest tightness, palpitations or swelling in the hands or feet.  Gastrointestinal: Denies abdominal pain, bloating, constipation, diarrhea or blood in the stool.  GU: Pt reports vaginal discharge, itching. Denies urgency, frequency, pain with urination, burning sensation, blood in urine, odor or discharge. Neurological: Denies dizziness, difficulty with memory, difficulty with speech or problems with balance and coordination.    No other specific complaints in a complete review of systems (except as listed in HPI above).  Objective:   Physical Exam   Pulse 87   Temp 97.9 F (36.6 C) (Oral)   Wt 113 lb (51.3 kg)   SpO2 98%   BMI 22.07 kg/m  Wt Readings from Last 3 Encounters:  09/20/17 113 lb (51.3 kg)  08/26/17 116 lb (52.6 kg)  07/28/17 122 lb 8 oz (55.6 kg)    General: Appears her stated age, well developed, well nourished in NAD. HEENT: Head: normal shape and size; Eyes:  PERRLA and EOMs intact, no nystagmus;  Abdomen: Soft and nontender. Normal bowel sounds. No distention or masses noted.  Pelvic: Self swabbed. Neurological: Alert and oriented.  Coordination normal.    BMET    Component Value Date/Time   NA 141 03/29/2017 1101   NA 140 04/08/2014 1913   K 3.9 03/29/2017 1101   K 3.8 04/08/2014 1913   CL 104 03/29/2017 1101   CL 103 04/08/2014 1913   CO2 29 03/29/2017 1101   CO2 33 (H) 04/08/2014 1913   GLUCOSE  91 03/29/2017 1101   GLUCOSE 92 04/08/2014 1913   BUN 17 03/29/2017 1101   BUN 19 (H) 04/08/2014 1913   CREATININE 0.87 03/29/2017 1101   CREATININE 0.98 04/08/2014 1913   CALCIUM 8.8 03/29/2017 1101   CALCIUM 9.2 04/08/2014 1913   GFRNONAA >60 10/21/2016 1040   GFRNONAA >60 04/08/2014 1913   GFRAA >60 10/21/2016 1040   GFRAA >60 04/08/2014 1913    Lipid Panel     Component Value Date/Time   CHOL 216 (H) 09/24/2016 1050   TRIG 53.0 09/24/2016 1050   HDL 73.40 09/24/2016 1050   CHOLHDL 3 09/24/2016 1050   VLDL 10.6 09/24/2016 1050   LDLCALC 132 (H) 09/24/2016 1050    CBC    Component Value Date/Time   WBC 6.0 03/29/2017 1101   RBC 4.41 03/29/2017 1101   HGB 13.2 03/29/2017 1101   HGB 13.6 04/08/2014 1913   HCT 40.3 03/29/2017 1101   HCT 40.9 04/08/2014 1913   PLT 282.0 03/29/2017 1101   PLT 233 04/08/2014 1913   MCV 91.3 03/29/2017 1101   MCV 91 04/08/2014 1913   MCH 30.3 10/21/2016 1040   MCHC 32.9 03/29/2017 1101   RDW 13.8 03/29/2017 1101   RDW 13.6 04/08/2014 1913   LYMPHSABS 1.3 09/24/2016 1050   LYMPHSABS 2.0 04/08/2014 1913   MONOABS 0.4 09/24/2016 1050   MONOABS 0.4 04/08/2014 1913   EOSABS 0.1 09/24/2016 1050   EOSABS 0.1 04/08/2014 1913   BASOSABS 0.0 09/24/2016 1050   BASOSABS 0.0 04/08/2014 1913    Hgb A1C No results found for: HGBA1C         Assessment & Plan:   Migraine:  30 mg Toradol IM today Take Imitrex if needed  Vaginal Discharge, Itching:  Will send off wet prep Avoid  douching, bubble baths  Return precautions discussed, will follow up after labs. Webb Silversmith, NP

## 2017-09-21 ENCOUNTER — Telehealth: Payer: Self-pay | Admitting: Podiatry

## 2017-09-21 ENCOUNTER — Telehealth: Payer: Self-pay | Admitting: *Deleted

## 2017-09-21 LAB — WET PREP BY MOLECULAR PROBE
CANDIDA SPECIES: NOT DETECTED
Gardnerella vaginalis: NOT DETECTED
MICRO NUMBER: 90841007
SPECIMEN QUALITY:: ADEQUATE
Trichomonas vaginosis: NOT DETECTED

## 2017-09-21 MED ORDER — GABAPENTIN 100 MG PO CAPS: 100.0000 mg | ORAL_CAPSULE | Freq: Three times a day (TID) | ORAL | 0 refills | Status: DC

## 2017-09-21 NOTE — Telephone Encounter (Signed)
This is Tanya Nguyen. I called yesterday morning and I don't think anyone called me back. I called back this morning around 8 am and talked to an automated machine. I need you to call me back, I'm having a lot of pain and numbness in my feet. I need to talk to y'all about it. Please call me back at [phone]. Okay. Bye bye.

## 2017-09-21 NOTE — Telephone Encounter (Addendum)
Pt states the Gabapentin 100mg  3 times a day makes her too groggy, the shots are not helping and she is having a lot of numbness.  I told pt I would need to speak with the doctor-on-call and get back with her.

## 2017-09-21 NOTE — Telephone Encounter (Signed)
Pt called to see if she could take a half of pill instead of whole. It makes her real sleepy

## 2017-09-21 NOTE — Telephone Encounter (Signed)
Dr. Paulla Dolly ordered Tanya Nguyen peripheral neuropathy cream and get an appt with Dr. Amalia Hailey. I informed pt and she states she had a prescription for the cream before, and her insurance will not cover and she can not afford, but she is using a OTC cream that is helping her nerve pain. I reviewed pt's clinicals and she was prescribed Meloxicam. I asked pt if she was taking the meloxicam and she states she thought the gabapentin replace the meloxicam. I instructed pt to take the gabapentin one at her bedtime and the meloxicam when she wakes and use the OTC cream through the day as pack directs. Pt states understanding and would like to call back to schedule her appt.

## 2017-09-22 LAB — POC URINALSYSI DIPSTICK (AUTOMATED)
BILIRUBIN UA: NEGATIVE
Glucose, UA: NEGATIVE
KETONES UA: NEGATIVE
LEUKOCYTES UA: NEGATIVE
Nitrite, UA: POSITIVE
PH UA: 6 (ref 5.0–8.0)
Protein, UA: POSITIVE — AB
Spec Grav, UA: >=1.03 — AB (ref 1.010–1.025)
Urobilinogen, UA: 0.2 E.U./dL

## 2017-09-22 NOTE — Addendum Note (Signed)
Addended by: Jacqualin Combes on: 09/22/2017 09:25 AM   Modules accepted: Orders

## 2017-09-25 ENCOUNTER — Other Ambulatory Visit: Payer: Self-pay

## 2017-09-25 ENCOUNTER — Emergency Department
Admission: EM | Admit: 2017-09-25 | Discharge: 2017-09-25 | Disposition: A | Discharge status: Home / Self Care | Payer: BLUE CROSS/BLUE SHIELD | Attending: Emergency Medicine | Admitting: Emergency Medicine

## 2017-09-25 DIAGNOSIS — Z79899 Other long term (current) drug therapy: Secondary | ICD-10-CM | POA: Insufficient documentation

## 2017-09-25 DIAGNOSIS — K625 Hemorrhage of anus and rectum: Secondary | ICD-10-CM | POA: Diagnosis present

## 2017-09-25 DIAGNOSIS — K922 Gastrointestinal hemorrhage, unspecified: Secondary | ICD-10-CM

## 2017-09-25 DIAGNOSIS — E039 Hypothyroidism, unspecified: Secondary | ICD-10-CM | POA: Insufficient documentation

## 2017-09-25 LAB — TYPE AND SCREEN
ABO/RH(D): O POS
ANTIBODY SCREEN: NEGATIVE

## 2017-09-25 LAB — COMPREHENSIVE METABOLIC PANEL
ALT: 12 U/L (ref 0–44)
AST: 26 U/L (ref 15–41)
Albumin: 4.1 g/dL (ref 3.5–5.0)
Alkaline Phosphatase: 48 U/L (ref 38–126)
Anion gap: 13 (ref 5–15)
BILIRUBIN TOTAL: 0.6 mg/dL (ref 0.3–1.2)
BUN: 31 mg/dL — ABNORMAL HIGH (ref 6–20)
CO2: 21 mmol/L — AB (ref 22–32)
CREATININE: 1.17 mg/dL — AB (ref 0.44–1.00)
Calcium: 9 mg/dL (ref 8.9–10.3)
Chloride: 105 mmol/L (ref 98–111)
GFR calc non Af Amer: 52 mL/min — ABNORMAL LOW (ref >60)
Glucose, Bld: 117 mg/dL — ABNORMAL HIGH (ref 70–99)
Potassium: 4.1 mmol/L (ref 3.5–5.1)
Sodium: 139 mmol/L (ref 135–145)
TOTAL PROTEIN: 6.9 g/dL (ref 6.5–8.1)

## 2017-09-25 LAB — CBC
HCT: 48.7 % — ABNORMAL HIGH (ref 35.0–47.0)
Hemoglobin: 16.4 g/dL — ABNORMAL HIGH (ref 12.0–16.0)
MCH: 30.3 pg (ref 26.0–34.0)
MCHC: 33.7 g/dL (ref 32.0–36.0)
MCV: 89.9 fL (ref 80.0–100.0)
PLATELETS: 383 10*3/uL (ref 150–440)
RBC: 5.42 MIL/uL — AB (ref 3.80–5.20)
RDW: 13.7 % (ref 11.5–14.5)
WBC: 12.1 10*3/uL — ABNORMAL HIGH (ref 3.6–11.0)

## 2017-09-25 LAB — CBC WITH DIFFERENTIAL/PLATELET
BASOS PCT: 0 %
Basophils Absolute: 0 10*3/uL (ref 0–0.1)
EOS ABS: 0 10*3/uL (ref 0–0.7)
Eosinophils Relative: 0 %
HEMATOCRIT: 44.2 % (ref 35.0–47.0)
Hemoglobin: 14.7 g/dL (ref 12.0–16.0)
Lymphocytes Relative: 11 %
Lymphs Abs: 1.3 10*3/uL (ref 1.0–3.6)
MCH: 30.3 pg (ref 26.0–34.0)
MCHC: 33.3 g/dL (ref 32.0–36.0)
MCV: 90.9 fL (ref 80.0–100.0)
MONO ABS: 0.6 10*3/uL (ref 0.2–0.9)
MONOS PCT: 5 %
Neutro Abs: 10.3 10*3/uL — ABNORMAL HIGH (ref 1.4–6.5)
Neutrophils Relative %: 84 %
Platelets: 295 10*3/uL (ref 150–440)
RBC: 4.86 MIL/uL (ref 3.80–5.20)
RDW: 13.7 % (ref 11.5–14.5)
WBC: 12.3 10*3/uL — ABNORMAL HIGH (ref 3.6–11.0)

## 2017-09-25 MED ORDER — METOCLOPRAMIDE HCL 5 MG/ML IJ SOLN
INTRAMUSCULAR | Status: AC
  Filled 2017-09-25: qty 2

## 2017-09-25 MED ORDER — FAMOTIDINE IN NACL 20-0.9 MG/50ML-% IV SOLN
20.0000 mg | Freq: Once | INTRAVENOUS | Status: AC
  Administered 2017-09-25: 20 mg via INTRAVENOUS

## 2017-09-25 MED ORDER — CIPROFLOXACIN HCL 500 MG PO TABS: 500.0000 mg | ORAL_TABLET | Freq: Two times a day (BID) | ORAL | 0 refills | Status: AC

## 2017-09-25 MED ORDER — DICYCLOMINE HCL 10 MG/ML IM SOLN
20.0000 mg | Freq: Once | INTRAMUSCULAR | Status: AC
  Administered 2017-09-25: 20 mg via INTRAMUSCULAR
  Filled 2017-09-25 (×2): qty 2

## 2017-09-25 MED ORDER — ONDANSETRON 4 MG PO TBDP
8.0000 mg | ORAL_TABLET | Freq: Once | ORAL | Status: AC
  Administered 2017-09-25: 8 mg via ORAL
  Filled 2017-09-25: qty 2

## 2017-09-25 MED ORDER — CIPROFLOXACIN HCL 500 MG PO TABS
500.0000 mg | ORAL_TABLET | Freq: Once | ORAL | Status: AC
  Administered 2017-09-25: 500 mg via ORAL
  Filled 2017-09-25: qty 1

## 2017-09-25 MED ORDER — METOCLOPRAMIDE HCL 5 MG/ML IJ SOLN
10.0000 mg | Freq: Once | INTRAMUSCULAR | Status: AC
  Administered 2017-09-25: 10 mg via INTRAVENOUS

## 2017-09-25 MED ORDER — DICYCLOMINE HCL 10 MG PO CAPS: 10.0000 mg | ORAL_CAPSULE | Freq: Three times a day (TID) | ORAL | 0 refills | Status: DC | PRN

## 2017-09-25 MED ORDER — FAMOTIDINE IN NACL 20-0.9 MG/50ML-% IV SOLN
INTRAVENOUS | Status: AC
  Filled 2017-09-25: qty 50

## 2017-09-25 MED ORDER — ONDANSETRON 8 MG PO TBDP
8.0000 mg | ORAL_TABLET | Freq: Three times a day (TID) | ORAL | 0 refills | Status: AC | PRN
Start: 2017-09-25 — End: 2017-09-30

## 2017-09-25 MED ORDER — SODIUM CHLORIDE 0.9 % IV BOLUS
1000.0000 mL | Freq: Once | INTRAVENOUS | Status: AC
  Administered 2017-09-25: 1000 mL via INTRAVENOUS

## 2017-09-25 NOTE — ED Notes (Signed)
Pharmacy emailed to send Bentyl

## 2017-09-25 NOTE — ED Notes (Signed)
Type and screen redrawn per lab request

## 2017-09-25 NOTE — ED Notes (Signed)
Pt requested pain medication - Dr Cherylann Banas notified  Dr Cherylann Banas also notified that on several occassions this nurse checked on the pt and she was asleep

## 2017-09-25 NOTE — Discharge Instructions (Addendum)
Take the antibiotic (Cipro) as prescribed starting tomorrow and finish the full 3-day course.  You can take the Bentyl as needed for pain or cramping, and the Zofran as needed for nausea.  Dr. Michele Mcalpine office will call you tomorrow to arrange for follow-up.  If you do not hear from them tomorrow you should call them.  The information has been provided here.  Is very important that you follow-up with the gastroenterologist.  Return to the ER for new, worsening, persistent bleeding, pain, vomiting or inability to take the medication, fevers, weakness or lightheadedness, or any other new or worsening symptoms that concern you.

## 2017-09-25 NOTE — ED Notes (Signed)
Spouse name and number Snigdha Howser [phone]

## 2017-09-25 NOTE — ED Triage Notes (Signed)
Pt to ER via POV c/o rectal and vaginal bleeding that started yesterday. Pt pale. Pt alert and oriented X4, active, cooperative, pt in NAD. RR even and unlabored.

## 2017-09-25 NOTE — ED Notes (Signed)
Pt reports that yesterday she was having rectal and vaginal bleeding from 2p until 10pm - she took imodium and it stopped the diarrhea - pt reports decreased energy today - she reports hx of ulcers - states that today she has had 2 episodes of diarrhea that are "blood" - pt reports upper abd pain - pt reports nausea and denies vomiting - pt reports that she is having difficulty breathing but at this time O2 sat 98% and respirations are even and unlabored

## 2017-09-25 NOTE — ED Provider Notes (Signed)
[facility] Emergency Department Provider Note ____________________________________________   First MD Initiated Contact with Patient 09/25/17 1515     (approximate)  I have reviewed the triage vital signs and the nursing notes.   HISTORY  Chief Complaint Rectal Bleeding    HPI Tanya Nguyen is a 52 y.o. female with PMH as noted below including history of duodenal ulcer who presents with blood in her stool since yesterday, described as both bright red blood and as some darker material.  The patient states that she started having diarrhea about 24 hours ago with multiple episodes over the course of the afternoon and evening yesterday.  She  reports that the stool was very watery and had some bright red blood especially after the first few episodes, and then some dark material also.  The patient also reports some clumps of blood that came from her vagina although she denies discharge or other acute vaginal symptoms.  She also denies urinary symptoms or any other abnormal bruising or bleeding.  She states that she did have to have a blood transfusion once before related to internal bleeding from her ulcer.  Patient reports some nausea and epigastric discomfort.  She denies fever or vomiting currently.   Past Medical History:  Diagnosis Date  . Bleeding duodenal ulcer   . Depression   . GERD (gastroesophageal reflux disease)   . Hypothyroidism   . Kidney stones   . Migraine headache     Patient Active Problem List   Diagnosis Date Noted  . Vitamin D deficiency 07/28/2017  . Vitamin B 12 deficiency 07/28/2017  . Chronic low back pain 07/28/2017  . Plantar fascial fibromatosis of both feet 11/10/2016  . Bipolar I disorder, most recent episode depressed ([place]) 03/01/2015  . PTSD (post-traumatic stress disorder) 02/27/2015  . Constipation 04/02/2014  . Fatigue 12/13/2013  . Insomnia 08/30/2013  . GERD (gastroesophageal reflux disease)   . Migraines   .  Hypothyroidism   . Bleeding duodenal ulcer     Past Surgical History:  Procedure Laterality Date  . ABDOMINAL HYSTERECTOMY  2004   partial  . CHOLECYSTECTOMY    . TOOTH EXTRACTION      Prior to Admission medications   Medication Sig Start Date End Date Taking? Authorizing Provider  cetirizine (ZYRTEC) 10 MG tablet Take 10 mg by mouth daily as needed for allergies.     [provider]  ciprofloxacin (CIPRO) 500 MG tablet Take 1 tablet (500 mg total) by mouth 2 (two) times daily for 3 days. 09/26/17 09/29/17  Arta Silence, MD  cyanocobalamin (,VITAMIN B-12,) 1000 MCG/ML injection Inject into the skin once monthly for 6 months for low vitamin B 12. 03/31/17   Pleas Koch, NP  cyclobenzaprine (FLEXERIL) 5 MG tablet Take 1 tablet (5 mg total) by mouth 3 (three) times daily as needed for muscle spasms. 05/10/16   Lucille Passy, MD  dicyclomine (BENTYL) 10 MG capsule Take 1 capsule (10 mg total) by mouth 3 (three) times daily as needed for up to 5 days for spasms (Pain). 09/25/17 09/30/17  Arta Silence, MD  gabapentin (NEURONTIN) 100 MG capsule Take 1 capsule (100 mg total) by mouth 3 (three) times daily. 09/21/17   Edrick Kins, DPM  levothyroxine (SYNTHROID, LEVOTHROID) 100 MCG tablet TAKE ONE TABLET DAILY BEFORE BREAKFAST. 11/10/16   Lucille Passy, MD  NONFORMULARY OR COMPOUNDED ITEM See pharmacy note 08/04/17   Edrick Kins, DPM  omeprazole (PRILOSEC) 40 MG capsule Take  1 capsule (40 mg total) by mouth every morning. 08/26/17   Copland, Frederico Hamman, MD  ondansetron (ZOFRAN ODT) 8 MG disintegrating tablet Take 1 tablet (8 mg total) by mouth every 8 (eight) hours as needed for up to 5 days for nausea or vomiting. 09/25/17 09/30/17  Arta Silence, MD  ranitidine (ZANTAC) 150 MG tablet Take 1 tablet by mouth at bedtime 08/26/17   Copland, Frederico Hamman, MD  sertraline (ZOLOFT) 50 MG tablet TAKE 1 TABLET BY MOUTH ONCE A DAY 06/21/17   Pleas Koch, NP  SUMAtriptan (IMITREX) 25  MG tablet TAKE 1 TABLET EVERY 2 HOURS AS NEEDED FOR MIGRAINE, MAY REPEAT IN 2 HOURS IF HEADACHE PERSISTS OR RECURS 12/01/16   Lucille Passy, MD  SYRINGE-NEEDLE, DISP, 3 ML (B-D 3CC LUER-LOK SYR 25GX1") 25G X 1" 3 ML MISC Use as instructed to injection B12 every 30 days 03/31/17   Pleas Koch, NP  traZODone (DESYREL) 150 MG tablet Take 1 tablet (150 mg total) by mouth at bedtime. 06/21/17   Pleas Koch, NP  Venlafaxine HCl 75 MG TB24 Take by mouth. 01/22/13   [provider]    Allergies Penicillins and Sulfa antibiotics  Family History  Problem Relation Age of Onset  . Cancer Mother   . Breast cancer Mother 39  . Cancer Father   . Alcohol abuse Sister   . Bipolar disorder Sister   . Alcohol abuse Maternal Grandmother   . Stroke Maternal Grandmother   . Stroke Paternal Grandmother     Social History Social History   Tobacco Use  . Smoking status: Never Smoker  . Smokeless tobacco: Never Used  Substance Use Topics  . Alcohol use: Yes    Alcohol/week: 0.0 oz  . Drug use: No    Review of Systems  Constitutional: No fever. Eyes: No redness. ENT: No sore throat. Cardiovascular: Denies chest pain. Respiratory: Denies shortness of breath. Gastrointestinal: Positive for nausea and diarrhea. Genitourinary: Negative for dysuria.  Musculoskeletal: Negative for back pain. Skin: Negative for rash. Neurological: Negative for headache.   ____________________________________________   PHYSICAL EXAM:  VITAL SIGNS: ED Triage Vitals [09/25/17 1413]  Enc Vitals Group     BP (!) 147/77     Pulse Rate (!) 129     Resp 20     Temp 97.7 F (36.5 C)     Temp Source Axillary     SpO2 99 %     Weight 113 lb (51.3 kg)     Height 5' (1.524 m)     Head Circumference      Peak Flow      Pain Score      Pain Loc      Pain Edu?      Excl. in [place]?     Constitutional: Alert and oriented.  Relatively well appearing and in no acute distress. Eyes: Conjunctivae  are normal.  No pallor. Head: Atraumatic. Nose: No congestion/rhinnorhea. Mouth/Throat: Mucous membranes are slightly dry.   Neck: Normal range of motion.  Cardiovascular: Normal rate, regular rhythm. Grossly normal heart sounds.  Good peripheral circulation. Respiratory: Normal respiratory effort.  No retractions. Lungs CTAB. Gastrointestinal: Soft and nontender.  Mild epigastric discomfort.  No distention.  On DRE, brown stool weakly guaiac positive. Genitourinary: No flank tenderness.  Normal external genitalia.  No vaginal bleeding or blood in the vaginal vault. Musculoskeletal: No lower extremity edema.  Extremities warm and well perfused.  Neurologic:  Normal speech and language. No gross focal neurologic  deficits are appreciated.  Skin:  Skin is warm and dry. No rash noted. Psychiatric: Mood and affect are normal. Speech and behavior are normal.  ____________________________________________   LABS (all labs ordered are listed, but only abnormal results are displayed)  Labs Reviewed  COMPREHENSIVE METABOLIC PANEL - Abnormal; Notable for the following components:      Result Value   CO2 21 (*)    Glucose, Bld 117 (*)    BUN 31 (*)    Creatinine, Ser 1.17 (*)    GFR calc non Af Amer 52 (*)    All other components within normal limits  CBC - Abnormal; Notable for the following components:   WBC 12.1 (*)    RBC 5.42 (*)    Hemoglobin 16.4 (*)    HCT 48.7 (*)    All other components within normal limits  CBC WITH DIFFERENTIAL/PLATELET - Abnormal; Notable for the following components:   WBC 12.3 (*)    Neutro Abs 10.3 (*)    All other components within normal limits  TYPE AND SCREEN  TYPE AND SCREEN  TYPE AND SCREEN   ____________________________________________  EKG  ED ECG REPORT I, Arta Silence, the attending physician, personally viewed and interpreted this ECG.  Date: 09/25/2017 EKG Time: 1439 Rate: 101 Rhythm: normal sinus rhythm QRS Axis:  normal Intervals: normal ST/T Wave abnormalities: normal Narrative Interpretation: no evidence of acute ischemia  ____________________________________________  RADIOLOGY    ____________________________________________   PROCEDURES  Procedure(s) performed: No  Procedures  Critical Care performed: No ____________________________________________   INITIAL IMPRESSION / ASSESSMENT AND PLAN / ED COURSE  Pertinent labs & imaging results that were available during my care of the patient were reviewed by me and considered in my medical decision making (see chart for details).  52 year old female with PMH as noted above including past history of duodenal ulcers presents with some bright red blood per rectum after a prolonged period of diarrhea yesterday, as well as some darker stool, epigastric discomfort, and nausea.  The patient also reports that she had some blood, from her vagina although this is no longer has stopped.  I reviewed the past medical records in Epic and confirmed the patient's past history.  She most recently had endoscopy here in 2007 which showed nonbleeding gastric ulcer.  On exam, the patient is relatively well-appearing.  She has borderline tachycardia but her other vital signs are normal.  The abdomen is soft with no focal tenderness.  Rectal exam shows no obvious source of bleeding in the stool is currently brown and weakly guaiac positive.  Vaginal exam is normal and the patient is status post hysterectomy.  Overall the presentation is most consistent with bleeding related to infectious diarrhea.  I suspect lower GI source given that the bleeding did not start until she was having several episodes of diarrhea.  However given the patient's history of PUD there is some concern for upper GI bleeding as well.  Initial labs are reassuring.  Will give fluids and symptomatic treatment and observe the patient for 4 hours.  I will repeat a CBC at that time.  If it is stable,  I will arrange for outpatient GI follow-up.  If her hemoglobin drops we will plan for admission.  ----------------------------------------- 9:08 PM on 09/25/2017 -----------------------------------------  The patient has remained stable in the emergency department.  Repeat hemoglobin dropped by about 1.5, however this is consistent with getting 1 L fluid and does not represent an actual drop.  Patient's vital signs  have stabilized.  She had one additional episode of diarrhea in the ED which had a small amount of bright red blood mixed in.  Overall, I suspect most likely infectious diarrhea which is causing the acute bleeding.  This appears to be primarily from a lower GI source.  Given the stable vital signs and stable CBC over the 4 hours, there is no indication for admission at this time.  I will treat the patient for infectious diarrhea.  I consulted Dr. Bonna Gains from GI primarily to arrange for close follow-up.  She agreed with the plan of care, and advised that the patient can follow-up with her office this week.  She took down the patient's information and I placed a referral in the chart.  I had an extensive discussion with the patient and her husband about the results of the work-up and the plan of care.  She feels well to go home.  She understands she will be getting an antibiotic and symptomatic treatment for pain and nausea.  She also understands the importance of following up with the gastroenterologist this week, and agrees to do so.  Return precautions given, and she expresses understanding.   ____________________________________________   FINAL CLINICAL IMPRESSION(S) / ED DIAGNOSES  Final diagnoses:  Lower GI bleed      NEW MEDICATIONS STARTED DURING THIS VISIT:  New Prescriptions   CIPROFLOXACIN (CIPRO) 500 MG TABLET    Take 1 tablet (500 mg total) by mouth 2 (two) times daily for 3 days.   DICYCLOMINE (BENTYL) 10 MG CAPSULE    Take 1 capsule (10 mg total) by mouth 3  (three) times daily as needed for up to 5 days for spasms (Pain).   ONDANSETRON (ZOFRAN ODT) 8 MG DISINTEGRATING TABLET    Take 1 tablet (8 mg total) by mouth every 8 (eight) hours as needed for up to 5 days for nausea or vomiting.     Note:  This document was prepared using Dragon voice recognition software and may include unintentional dictation errors.    Arta Silence, MD 09/25/17 2112

## 2017-09-25 NOTE — ED Notes (Signed)
Pt requesting pain medication - Dr Cherylann Banas is aware

## 2017-09-26 ENCOUNTER — Ambulatory Visit: Payer: BLUE CROSS/BLUE SHIELD | Admitting: Primary Care

## 2017-09-26 ENCOUNTER — Encounter: Payer: Self-pay | Admitting: Primary Care

## 2017-09-26 VITALS — BP 116/72 | HR 88 | Temp 98.2°F | Ht 60.0 in | Wt 110.2 lb

## 2017-09-26 DIAGNOSIS — G43701 Chronic migraine without aura, not intractable, with status migrainosus: Secondary | ICD-10-CM | POA: Diagnosis not present

## 2017-09-26 MED ORDER — TOPIRAMATE 25 MG PO TABS: 25.0000 mg | ORAL_TABLET | Freq: Every day | ORAL | 1 refills | Status: DC

## 2017-09-26 NOTE — Patient Instructions (Signed)
Start the topiramate 25 mg tablets for headache/migraine prevention. Take 1 tablet by mouth at bedtime.  Avoid use of the gabapentin frequently if possible.   Use the sumatriptan as needed for severe migraines.   Schedule a follow up visit in 4 weeks for migraine re-evaluation.   Please call me sooner if you have any problems with the new medication.  It was a pleasure to see you today!

## 2017-09-26 NOTE — Assessment & Plan Note (Signed)
Occurring twice monthly on average. Intolerant to propranolol.  Given numerous potentially sedating medications and the risk for interaction, treatment is difficult. Will trial low dose Topamax at 25 mg nightly. She will update in a few weeks. Continue sumatriptan PRN. Neuro exam today unremarkable.

## 2017-09-26 NOTE — Progress Notes (Signed)
Subjective:    Patient ID: Tanya Nguyen, female    DOB: [birth year], 52 y.o.   MRN: [id number]  HPI  Tanya Nguyen is a 52 year old female with a history of migraines who presents today to discuss migraines.   She is currently not on preventative treatment. She was on preventative treatment several months ago with Propranolol which helped to improve migraines and headaches but caused extremity swelling which was intolerable.   Her migraines/headaches are located to the right parietal/temporal region with movement to bilateral occipital and frontal lobes. Her migraines/headaches will last all day without improvement with OTC treatment. She will experience photophobia, phonophobia, nausea. She is taking Sumatriptan two times monthly on average. Headaches occur once weekly.   She was provided with a prescription for gabapentin by podiatry for which she takes as needed. She does have a migraine now which has improved with a dose of sumatriptan she took this morning.   Wt Readings from Last 3 Encounters:  09/26/17 110 lb 4 oz (50 kg)  09/25/17 113 lb (51.3 kg)  09/20/17 113 lb (51.3 kg)     Review of Systems  Eyes: Positive for photophobia.  Respiratory: Negative for shortness of breath.   Cardiovascular: Negative for chest pain.  Gastrointestinal: Negative for nausea.  Neurological: Positive for headaches. Negative for dizziness.       Past Medical History:  Diagnosis Date  . Bleeding duodenal ulcer   . Depression   . GERD (gastroesophageal reflux disease)   . Hypothyroidism   . Kidney stones   . Migraine headache      Social History   Socioeconomic History  . Marital status: Married    Spouse name: Not on file  . Number of children: Not on file  . Years of education: Not on file  . Highest education level: Not on file  Occupational History  . Not on file  Social Needs  . Financial resource strain: Not on file  . Food insecurity:    Worry: Not on file    Inability: Not  on file  . Transportation needs:    Medical: Not on file    Non-medical: Not on file  Tobacco Use  . Smoking status: Never Smoker  . Smokeless tobacco: Never Used  Substance and Sexual Activity  . Alcohol use: Yes    Alcohol/week: 0.0 oz  . Drug use: No  . Sexual activity: Yes    Birth control/protection: None  Lifestyle  . Physical activity:    Days per week: Not on file    Minutes per session: Not on file  . Stress: Not on file  Relationships  . Social connections:    Talks on phone: Not on file    Gets together: Not on file    Attends religious service: Not on file    Active member of club or organization: Not on file    Attends meetings of clubs or organizations: Not on file    Relationship status: Not on file  . Intimate partner violence:    Fear of current or ex partner: Not on file    Emotionally abused: Not on file    Physically abused: Not on file    Forced sexual activity: Not on file  Other Topics Concern  . Not on file  Social History Narrative  . Not on file    Past Surgical History:  Procedure Laterality Date  . ABDOMINAL HYSTERECTOMY  2004   partial  . CHOLECYSTECTOMY    .  TOOTH EXTRACTION      Family History  Problem Relation Age of Onset  . Cancer Mother   . Breast cancer Mother 15  . Cancer Father   . Alcohol abuse Sister   . Bipolar disorder Sister   . Alcohol abuse Maternal Grandmother   . Stroke Maternal Grandmother   . Stroke Paternal Grandmother     Allergies  Allergen Reactions  . Penicillins Shortness Of Breath, Diarrhea and Nausea And Vomiting    Has patient had a PCN reaction causing immediate rash, facial/tongue/throat swelling, SOB or lightheadedness with hypotension: yes Has patient had a PCN reaction causing severe rash involving mucus membranes or skin necrosis: no Has patient had a PCN reaction that required hospitalization no Has patient had a PCN reaction occurring within the last 10 years: about 10 years If all of the  above answers are "NO", then may proceed with Cephalosporin use.   . Sulfa Antibiotics Nausea And Vomiting    Other reaction(s): VOMITING    Current Outpatient Medications on File Prior to Visit  Medication Sig Dispense Refill  . cetirizine (ZYRTEC) 10 MG tablet Take 10 mg by mouth daily as needed for allergies.     . ciprofloxacin (CIPRO) 500 MG tablet Take 1 tablet (500 mg total) by mouth 2 (two) times daily for 3 days. 6 tablet 0  . cyanocobalamin (,VITAMIN B-12,) 1000 MCG/ML injection Inject into the skin once monthly for 6 months for low vitamin B 12. 3 mL 1  . gabapentin (NEURONTIN) 100 MG capsule Take 1 capsule (100 mg total) by mouth 3 (three) times daily. 90 capsule 0  . levothyroxine (SYNTHROID, LEVOTHROID) 100 MCG tablet TAKE ONE TABLET DAILY BEFORE BREAKFAST. 30 tablet 11  . NONFORMULARY OR COMPOUNDED ITEM See pharmacy note 120 each 2  . omeprazole (PRILOSEC) 40 MG capsule Take 1 capsule (40 mg total) by mouth every morning. 90 capsule 1  . ondansetron (ZOFRAN ODT) 8 MG disintegrating tablet Take 1 tablet (8 mg total) by mouth every 8 (eight) hours as needed for up to 5 days for nausea or vomiting. 10 tablet 0  . ranitidine (ZANTAC) 150 MG tablet Take 1 tablet by mouth at bedtime 90 tablet 1  . sertraline (ZOLOFT) 50 MG tablet TAKE 1 TABLET BY MOUTH ONCE A DAY 90 tablet 2  . SUMAtriptan (IMITREX) 25 MG tablet TAKE 1 TABLET EVERY 2 HOURS AS NEEDED FOR MIGRAINE, MAY REPEAT IN 2 HOURS IF HEADACHE PERSISTS OR RECURS 10 tablet 3  . SYRINGE-NEEDLE, DISP, 3 ML (B-D 3CC LUER-LOK SYR 25GX1") 25G X 1" 3 ML MISC Use as instructed to injection B12 every 30 days 10 each 0  . traZODone (DESYREL) 150 MG tablet Take 1 tablet (150 mg total) by mouth at bedtime. 90 tablet 2   No current facility-administered medications on file prior to visit.     BP 116/72   Pulse 88   Temp 98.2 F (36.8 C) (Oral)   Ht 5' (1.524 m)   Wt 110 lb 4 oz (50 kg)   SpO2 98%   BMI 21.53 kg/m    Objective:    Physical Exam  Constitutional: She is oriented to person, place, and time. She appears well-nourished.  Eyes: EOM are normal.  Neck: Neck supple.  Cardiovascular: Normal rate and regular rhythm.  Respiratory: Effort normal and breath sounds normal.  Neurological: She is alert and oriented to person, place, and time. No cranial nerve deficit.  Skin: Skin is warm and dry.  Psychiatric:  She has a normal mood and affect.           Assessment & Plan:

## 2017-10-07 ENCOUNTER — Ambulatory Visit: Payer: BLUE CROSS/BLUE SHIELD | Admitting: Podiatry

## 2017-10-11 ENCOUNTER — Ambulatory Visit: Payer: BLUE CROSS/BLUE SHIELD | Admitting: Gastroenterology

## 2017-10-11 ENCOUNTER — Telehealth: Payer: Self-pay | Admitting: Family Medicine

## 2017-10-11 DIAGNOSIS — G43701 Chronic migraine without aura, not intractable, with status migrainosus: Secondary | ICD-10-CM

## 2017-10-12 ENCOUNTER — Ambulatory Visit: Payer: BLUE CROSS/BLUE SHIELD | Admitting: Family Medicine

## 2017-10-12 ENCOUNTER — Encounter: Payer: Self-pay | Admitting: Family Medicine

## 2017-10-12 VITALS — BP 90/60 | HR 93 | Temp 97.5°F | Ht 60.0 in | Wt 111.5 lb

## 2017-10-12 DIAGNOSIS — S61012A Laceration without foreign body of left thumb without damage to nail, initial encounter: Secondary | ICD-10-CM | POA: Diagnosis not present

## 2017-10-12 NOTE — Telephone Encounter (Signed)
KC-Plz see refill req/thx dmf 

## 2017-10-12 NOTE — Progress Notes (Signed)
Dr. Frederico Hamman T. Yashas Camilli, MD, [facility] [place], [address] Phone: [phone] Fax: [phone]  10/12/2017  Patient: Tanya Nguyen, MRN: [id number], DOB: Jan 07, 1966, 52 y.o.  Primary Physician:  Pleas Koch, NP   Chief Complaint  Patient presents with  . Laceration    Left thumb-cut peeling potatoes on Monday   Subjective:   Tanya Nguyen is a 52 y.o. very pleasant female patient who presents with the following:  DOI: 10/10/2017  L thumb: Patient was cutting up some potatoes on Monday, and she cut the tip of her finger on the left at the first digit.  It bled quite profusely.  At that time she could not seek any medical attention so she stopped the bleeding, and she has been cleaning this routinely and using some Neosporin as well as some gauze bandages.  Past Medical History, Surgical History, Social History, Family History, Problem List, Medications, and Allergies have been reviewed and updated if relevant.  Patient Active Problem List   Diagnosis Date Noted  . Vitamin D deficiency 07/28/2017  . Vitamin B 12 deficiency 07/28/2017  . Chronic low back pain 07/28/2017  . Plantar fascial fibromatosis of both feet 11/10/2016  . Bipolar I disorder, most recent episode depressed ([place]) 03/01/2015  . PTSD (post-traumatic stress disorder) 02/27/2015  . Constipation 04/02/2014  . Fatigue 12/13/2013  . Insomnia 08/30/2013  . GERD (gastroesophageal reflux disease)   . Migraines   . Hypothyroidism   . Bleeding duodenal ulcer     Past Medical History:  Diagnosis Date  . Bleeding duodenal ulcer   . Depression   . GERD (gastroesophageal reflux disease)   . Hypothyroidism   . Kidney stones   . Migraine headache     Past Surgical History:  Procedure Laterality Date  . ABDOMINAL HYSTERECTOMY  2004   partial  . CHOLECYSTECTOMY    . TOOTH EXTRACTION      Social History   Socioeconomic History  . Marital  status: Married    Spouse name: Not on file  . Number of children: Not on file  . Years of education: Not on file  . Highest education level: Not on file  Occupational History  . Not on file  Social Needs  . Financial resource strain: Not on file  . Food insecurity:    Worry: Not on file    Inability: Not on file  . Transportation needs:    Medical: Not on file    Non-medical: Not on file  Tobacco Use  . Smoking status: Never Smoker  . Smokeless tobacco: Never Used  Substance and Sexual Activity  . Alcohol use: Yes    Alcohol/week: 0.0 oz  . Drug use: No  . Sexual activity: Yes    Birth control/protection: None  Lifestyle  . Physical activity:    Days per week: Not on file    Minutes per session: Not on file  . Stress: Not on file  Relationships  . Social connections:    Talks on phone: Not on file    Gets together: Not on file    Attends religious service: Not on file    Active member of club or organization: Not on file    Attends meetings of clubs or organizations: Not on file    Relationship status: Not on file  . Intimate partner violence:    Fear of current or ex partner: Not on file    Emotionally abused:  Not on file    Physically abused: Not on file    Forced sexual activity: Not on file  Other Topics Concern  . Not on file  Social History Narrative  . Not on file    Family History  Problem Relation Age of Onset  . Cancer Mother   . Breast cancer Mother 8  . Cancer Father   . Alcohol abuse Sister   . Bipolar disorder Sister   . Alcohol abuse Maternal Grandmother   . Stroke Maternal Grandmother   . Stroke Paternal Grandmother     Allergies  Allergen Reactions  . Penicillins Shortness Of Breath, Diarrhea and Nausea And Vomiting    Has patient had a PCN reaction causing immediate rash, facial/tongue/throat swelling, SOB or lightheadedness with hypotension: yes Has patient had a PCN reaction causing severe rash involving mucus membranes or skin  necrosis: no Has patient had a PCN reaction that required hospitalization no Has patient had a PCN reaction occurring within the last 10 years: about 10 years If all of the above answers are "NO", then may proceed with Cephalosporin use.   . Sulfa Antibiotics Nausea And Vomiting    Other reaction(s): VOMITING    Medication list reviewed and updated in full in [facility].   GEN: No acute illnesses, no fevers, chills. GI: No n/v/d, eating normally Pulm: No SOB Interactive and getting along well at home.  Otherwise, ROS is as per the HPI.  Objective:   BP 90/60   Pulse 93   Temp (!) 97.5 F (36.4 C) (Oral)   Ht 5' (1.524 m)   Wt 111 lb 8 oz (50.6 kg)   BMI 21.78 kg/m   GEN: WDWN, NAD, Non-toxic, A & O x 3 HEENT: Atraumatic, Normocephalic. Neck supple. No masses, No LAD. Ears and Nose: No external deformity. CV: RRR, No M/G/R. No JVD. No thrill. No extra heart sounds. PULM: CTA B, no wheezes, crackles, rhonchi. No retractions. No resp. distress. No accessory muscle use. EXTR: No c/c/e NEURO Normal gait.  PSYCH: Normally interactive. Conversant. Not depressed or anxious appearing.  Calm demeanor.   Left first digit does have an incision that is about 8 mm deep, and this is at the tip.  It is clean dry and intact.  No signs of infection, the patient is neurovascularly intact, she moves her digit without any difficulty.  Laboratory and Imaging Data:  Assessment and Plan:   Thumb laceration, left, initial encounter  She has been doing a very good job caring for her wound, and I would continue doing it in the exact manner.  Her tetanus is up-to-date, and there is no infection.  We bandaged her wound in the office and Tanya Nguyen showed her how to do so.  Follow-up: No follow-ups on file.   Signed,  Maud Deed. Rhylen Shaheen, MD   Allergies as of 10/12/2017      Reactions   Penicillins Shortness Of Breath, Diarrhea, Nausea And Vomiting   Has patient had a PCN reaction  causing immediate rash, facial/tongue/throat swelling, SOB or lightheadedness with hypotension: yes Has patient had a PCN reaction causing severe rash involving mucus membranes or skin necrosis: no Has patient had a PCN reaction that required hospitalization no Has patient had a PCN reaction occurring within the last 10 years: about 10 years If all of the above answers are "NO", then may proceed with Cephalosporin use.   Sulfa Antibiotics Nausea And Vomiting   Other reaction(s): VOMITING  Medication List        Accurate as of 10/12/17 11:59 PM. Always use your most recent med list.          cetirizine 10 MG tablet Commonly known as:  ZYRTEC Take 10 mg by mouth daily as needed for allergies.   cyanocobalamin 1000 MCG/ML injection Commonly known as:  (VITAMIN B-12) Inject into the skin once monthly for 6 months for low vitamin B 12.   gabapentin 100 MG capsule Commonly known as:  NEURONTIN Take 1 capsule (100 mg total) by mouth 3 (three) times daily.   levothyroxine 100 MCG tablet Commonly known as:  SYNTHROID, LEVOTHROID TAKE ONE TABLET DAILY BEFORE BREAKFAST.   NONFORMULARY OR COMPOUNDED ITEM See pharmacy note   omeprazole 40 MG capsule Commonly known as:  PRILOSEC Take 1 capsule (40 mg total) by mouth every morning.   ranitidine 150 MG tablet Commonly known as:  ZANTAC Take 1 tablet by mouth at bedtime   sertraline 50 MG tablet Commonly known as:  ZOLOFT TAKE 1 TABLET BY MOUTH ONCE A DAY   SUMAtriptan 25 MG tablet Commonly known as:  IMITREX TAKE 1 TABLET EVERY 2 HOURS AS NEEDED FOR MIGRAINE, MAY REPEAT IN 2 HOURS IF HEADACHE PERSISTS OR RECURS   SYRINGE-NEEDLE (DISP) 3 ML 25G X 1" 3 ML Misc Use as instructed to injection B12 every 30 days   topiramate 25 MG tablet Commonly known as:  TOPAMAX Take 1 tablet (25 mg total) by mouth at bedtime.   traZODone 150 MG tablet Commonly known as:  DESYREL Take 1 tablet (150 mg total) by mouth at bedtime.

## 2017-10-12 NOTE — Telephone Encounter (Signed)
Noted, refill sent to pharmacy. 

## 2017-10-13 MED ORDER — TRAMADOL HCL 50 MG PO TABS: 50.0000 mg | ORAL_TABLET | Freq: Four times a day (QID) | ORAL | 0 refills | Status: DC | PRN

## 2017-10-13 NOTE — Telephone Encounter (Signed)
Tanya Nguyen notified as instructed by telephone.  She would like to try the Tramadol.  Tramadol called into [name] as instructed by Dr. Lorelei Pont.

## 2017-10-13 NOTE — Telephone Encounter (Signed)
No pain creams exist for this type of injury  I would be comfortable giving her a small number of tramadol only. She can always take 2 over the counter tylenol tablets up to three times a day, too  Tramadol 50 mg, 1 po q 6 hours prn pain, #10, 0 refills

## 2017-10-13 NOTE — Telephone Encounter (Signed)
Copied from [place] [id number]. Topic: Inquiry >> Oct 13, 2017 12:05 PM Pricilla Handler wrote: Reason for CRM: Patient called stating that her left thumb injury is in a lot of pain. Patient has requested if Dr. Lorelei Pont or Belenda Cruise would prescribe to her a pain cream and/or medication to relive patient's pain.       Thank You!!!

## 2017-10-21 ENCOUNTER — Ambulatory Visit: Payer: Self-pay | Admitting: Primary Care

## 2017-10-27 ENCOUNTER — Ambulatory Visit: Payer: BLUE CROSS/BLUE SHIELD | Admitting: Gastroenterology

## 2017-10-27 ENCOUNTER — Encounter

## 2017-11-02 ENCOUNTER — Ambulatory Visit: Payer: BLUE CROSS/BLUE SHIELD | Admitting: Primary Care

## 2017-11-10 ENCOUNTER — Ambulatory Visit
Admission: RE | Admit: 2017-11-10 | Discharge: 2017-11-10 | Disposition: A | Discharge status: Home / Self Care | Payer: BLUE CROSS/BLUE SHIELD | Source: Ambulatory Visit | Attending: Primary Care | Admitting: Primary Care

## 2017-11-10 DIAGNOSIS — Z1231 Encounter for screening mammogram for malignant neoplasm of breast: Secondary | ICD-10-CM | POA: Insufficient documentation

## 2017-11-11 ENCOUNTER — Other Ambulatory Visit: Payer: Self-pay | Admitting: Primary Care

## 2017-11-11 DIAGNOSIS — G43701 Chronic migraine without aura, not intractable, with status migrainosus: Secondary | ICD-10-CM

## 2017-11-11 NOTE — Telephone Encounter (Signed)
This is likely an autofill as she's taking her Imitrex 1-2 times monthly on average.  Refill too soon.

## 2017-11-11 NOTE — Telephone Encounter (Signed)
Last prescribed on 10/12/2017 Last office visit on 10/12/2017 with Dr Lorelei Pont for acute

## 2017-11-16 ENCOUNTER — Other Ambulatory Visit: Payer: Self-pay | Admitting: Primary Care

## 2017-11-16 ENCOUNTER — Telehealth: Payer: Self-pay | Admitting: Primary Care

## 2017-11-16 DIAGNOSIS — G43701 Chronic migraine without aura, not intractable, with status migrainosus: Secondary | ICD-10-CM

## 2017-11-16 MED ORDER — SUMATRIPTAN SUCCINATE 25 MG PO TABS: ORAL_TABLET | ORAL | 0 refills | Status: DC

## 2017-11-16 NOTE — Telephone Encounter (Signed)
Per protocol BP on 10/12/17:  90/60.  BP on 09/26/17:  116/72. Refilled Imitrex

## 2017-11-16 NOTE — Telephone Encounter (Signed)
Copied from [place] [phone] [phone]. Topic: Quick Communication - Rx Refill/Question >> Nov 16, 2017 12:15 PM Cecelia Byars, NT wrote: Medication:SUMAtriptan (IMITREX) 25 MG tablet  Has the patient contacted their pharmacy? yes  (Agent: If no, request that the patient contact the pharmacy for the refill (Agent: If yes, when and what did the pharmacy advise?  Preferred Pharmacy (with phone number or street name [place], [place] - [address] [phone] (Phone) [phone] (Fax    Agent: Please be advised that RX refills may take up to 3 business days. We ask that you follow-up with your pharmacy.

## 2017-11-16 NOTE — Telephone Encounter (Signed)
Imitrex refill Last Refill:10/22/17  # 10 tabs  0 refills Last OV: 10/12/17 PCP: Gentry Fitz Pharmacy:[facility] [place]

## 2017-12-05 ENCOUNTER — Other Ambulatory Visit: Payer: Self-pay | Admitting: Primary Care

## 2017-12-05 DIAGNOSIS — G43701 Chronic migraine without aura, not intractable, with status migrainosus: Secondary | ICD-10-CM

## 2017-12-05 NOTE — Telephone Encounter (Signed)
How's she doing on topiramate (Topamax) daily for migraine prevention? Is it helping with headaches/migraines?

## 2017-12-05 NOTE — Telephone Encounter (Signed)
Last prescribed on 09/26/2017  Last office visit on 10/12/2017 with Dr Lorelei Pont for acute.

## 2017-12-06 NOTE — Telephone Encounter (Signed)
Noted, refill sent to pharmacy. 

## 2017-12-06 NOTE — Telephone Encounter (Signed)
Spoke to patient by telephone and was advised that the medication is working pretty good for her. Patient stated that she has a headache every now and then, but not like before.

## 2017-12-19 ENCOUNTER — Encounter: Payer: Self-pay | Admitting: Family Medicine

## 2017-12-19 ENCOUNTER — Other Ambulatory Visit: Payer: Self-pay | Admitting: Family Medicine

## 2017-12-19 ENCOUNTER — Ambulatory Visit: Payer: BLUE CROSS/BLUE SHIELD | Admitting: Family Medicine

## 2017-12-19 ENCOUNTER — Encounter (INDEPENDENT_AMBULATORY_CARE_PROVIDER_SITE_OTHER): Payer: Self-pay

## 2017-12-19 VITALS — BP 112/60 | HR 80 | Temp 97.9°F | Ht 60.0 in | Wt 104.5 lb

## 2017-12-19 DIAGNOSIS — E038 Other specified hypothyroidism: Secondary | ICD-10-CM | POA: Diagnosis not present

## 2017-12-19 DIAGNOSIS — K219 Gastro-esophageal reflux disease without esophagitis: Secondary | ICD-10-CM | POA: Diagnosis not present

## 2017-12-19 DIAGNOSIS — R1013 Epigastric pain: Secondary | ICD-10-CM

## 2017-12-19 LAB — CBC WITH DIFFERENTIAL/PLATELET
BASOS PCT: 0.7 % (ref 0.0–3.0)
Basophils Absolute: 0 10*3/uL (ref 0.0–0.1)
EOS PCT: 1.2 % (ref 0.0–5.0)
Eosinophils Absolute: 0.1 10*3/uL (ref 0.0–0.7)
HCT: 43.1 % (ref 36.0–46.0)
HEMOGLOBIN: 14.3 g/dL (ref 12.0–15.0)
Lymphocytes Relative: 28.3 % (ref 12.0–46.0)
Lymphs Abs: 1.3 10*3/uL (ref 0.7–4.0)
MCHC: 33.3 g/dL (ref 30.0–36.0)
MCV: 90.2 fl (ref 78.0–100.0)
MONOS PCT: 8.2 % (ref 3.0–12.0)
Monocytes Absolute: 0.4 10*3/uL (ref 0.1–1.0)
Neutro Abs: 2.9 10*3/uL (ref 1.4–7.7)
Neutrophils Relative %: 61.6 % (ref 43.0–77.0)
Platelets: 252 10*3/uL (ref 150.0–400.0)
RBC: 4.78 Mil/uL (ref 3.87–5.11)
RDW: 13.7 % (ref 11.5–15.5)
WBC: 4.7 10*3/uL (ref 4.0–10.5)

## 2017-12-19 LAB — COMPREHENSIVE METABOLIC PANEL
ALBUMIN: 4.4 g/dL (ref 3.5–5.2)
ALT: 5 U/L (ref 0–35)
AST: 11 U/L (ref 0–37)
Alkaline Phosphatase: 49 U/L (ref 39–117)
BUN: 18 mg/dL (ref 6–23)
CHLORIDE: 104 meq/L (ref 96–112)
CO2: 30 mEq/L (ref 19–32)
Calcium: 9.5 mg/dL (ref 8.4–10.5)
Creatinine, Ser: 1 mg/dL (ref 0.40–1.20)
GFR: 61.59 mL/min (ref >60.00)
Glucose, Bld: 102 mg/dL — ABNORMAL HIGH (ref 70–99)
POTASSIUM: 3.5 meq/L (ref 3.5–5.1)
SODIUM: 140 meq/L (ref 135–145)
Total Bilirubin: 0.3 mg/dL (ref 0.2–1.2)
Total Protein: 7 g/dL (ref 6.0–8.3)

## 2017-12-19 LAB — TSH: TSH: 2.51 u[IU]/mL (ref 0.35–4.50)

## 2017-12-19 LAB — LIPASE: Lipase: 13 U/L (ref 11.0–59.0)

## 2017-12-19 MED ORDER — SUCRALFATE 1 G PO TABS: 1.0000 g | ORAL_TABLET | Freq: Three times a day (TID) | ORAL | 1 refills | Status: DC

## 2017-12-19 MED ORDER — ONDANSETRON HCL 4 MG PO TABS: 4.0000 mg | ORAL_TABLET | Freq: Three times a day (TID) | ORAL | 0 refills | Status: DC | PRN

## 2017-12-19 NOTE — Patient Instructions (Addendum)
I think you have another ulcer. Labs today.  We will refer you to GI for further evaluation. Continue omeprazole and zantac twice daily.  Add carafate tablet 3 times daily as needed with meals.  zofran (ondansetron) nausea medicine refilled.  No more BC powders - just take tylenol as needed.  Bland diet until you start feeling better.   Peptic Ulcer A peptic ulcer is a sore in the lining of the esophagus (esophageal ulcer), the stomach (gastric ulcer), or the first part of the small intestine (duodenal ulcer). The ulcer causes gradual wearing away (erosion) into the deeper tissue. What are the causes? Normally, the lining of the stomach and the small intestine protects itself from the acid that digests food. The protective lining can be damaged by:  An infection caused by a germ (bacterium) called Helicobacter pylori or H. pylori.  Regular use of NSAIDs, such as ibuprofen or aspirin.  Rare tumors in the stomach, small intestine, or pancreas (Zollinger-Ellison syndrome).  What increases the risk? The following factors may make you more likely to develop this condition:  Smoking.  Having a family history of ulcer disease.  What are the signs or symptoms? Symptoms of this condition include:  Burning pain or gnawing in the area between the chest and the belly button. The pain may be worse on an empty stomach and at night.  Heartburn.  Nausea and vomiting.  Bloating.  If the ulcer results in bleeding, it can cause:  Black, tarry stools.  Vomiting of bright red blood.  Vomiting of material that looks like coffee grounds.  How is this diagnosed? This condition may be diagnosed based on:  Medical history and physical exam.  Various tests or procedures, such as: ? Blood tests, stool tests, or breath tests to check for the H. pylori bacterium. ? An X-ray exam (upper gastrointestinal series) of the esophagus, stomach, and small intestine. ? Upper endoscopy. The health care  provider examines the esophagus, stomach, and small intestine using a small flexible tube that has a video camera at the end. ? Biopsy. A tissue sample is removed to be examined under a microscope.  How is this treated? Treatment for this condition may include:  Eliminating the cause of the ulcer, such as smoking or the use of NSAIDs or alcohol.  Medicines to reduce the amount of acid in your digestive tract.  Antibiotic medicines, if the ulcer is caused by the H. pylori bacterium.  An upper endoscopy to treat a bleeding ulcer.  Surgery, if the bleeding is severe or if the ulcer created a hole somewhere in the digestive system.  Follow these instructions at home:  Avoid alcohol and caffeine.  Do not use any tobacco products, such as cigarettes, chewing tobacco, and e-cigarettes. If you need help quitting, ask your health care provider.  Take over-the-counter and prescription medicines only as told by your health care provider. Do not use over-the-counter medicines in place of prescription medicines unless your health care provider approves.  Keep all follow-up visits as told by your health care provider. This is important. Contact a health care provider if:  Your symptoms do not improve within 7 days of starting treatment.  You have ongoing indigestion or heartburn. Get help right away if:  You have sudden, sharp, or persistent pain in your abdomen.  You have bloody or dark black, tarry stools.  You vomit blood or material that looks like coffee grounds.  You become light-headed or you feel faint.  You become weak.  You become sweaty or clammy. This information is not intended to replace advice given to you by your health care provider. Make sure you discuss any questions you have with your health care provider. Document Released: 02/20/2000 Document Revised: 07/28/2015 Document Reviewed: 11/23/2014 Elsevier Interactive Patient Education  2018 [employer] for Peptic Ulcer Disease When you have peptic ulcer disease, the foods you eat and your eating habits are very important. Choosing the right foods can help ease the discomfort of peptic ulcer disease. What general guidelines do I need to follow?  Choose fruits, vegetables, whole grains, and low-fat meat, fish, and poultry.  Keep a food diary to identify foods that cause symptoms.  Avoid foods that cause irritation or pain. These may be different for different people.  Eat frequent small meals instead of three large meals each day. The pain may be worse when your stomach is empty.  Avoid eating close to bedtime. What foods are not recommended? The following are some foods and drinks that may worsen your symptoms:  Black, white, and red pepper.  Hot sauce.  Chili peppers.  Chili powder.  Chocolate and cocoa.  Alcohol.  Tea, coffee, and cola (regular and decaffeinated).  The items listed above may not be a complete list of foods and beverages to avoid. Contact your dietitian for more information. This information is not intended to replace advice given to you by your health care provider. Make sure you discuss any questions you have with your health care provider. Document Released: 05/17/2011 Document Revised: 07/31/2015 Document Reviewed: 12/27/2012 Elsevier Interactive Patient Education  2017 [employer].

## 2017-12-19 NOTE — Progress Notes (Signed)
BP 112/60 (BP Location: Left Arm, Patient Position: Sitting, Cuff Size: Normal)   Pulse 80   Temp 97.9 F (36.6 C) (Oral)   Ht 5' (1.524 m)   Wt 104 lb 8 oz (47.4 kg)   SpO2 98%   BMI 20.41 kg/m    CC: epigastric pain with vomiting Subjective:    Patient ID: Tanya Nguyen, female    DOB: 1966-02-24, 52 y.o.   MRN: [id number]  HPI: Tanya Nguyen is a 52 y.o. female presenting on 12/19/2017 for Abdominal Pain (C/o abd pain in epigastric area. Has had for 9 yrs due to ulcers. Has vomited yesterday 4x. Had diarreha on 12/17/17. States she has a lot of stress due to caring for her mother. )   1d h/o severe sharp epigastric pain with radiation down to bilateral lower quadrants and to back, associated with nausea/vomiting x4 yesterday. NBNB. Endorses 10/10 pain. Hesitant to go to ER due to cost. No fevers/chills, no jaundice, bowel changes, blood in stool or urine. Weight loss endorsed (30 lbs in last 4 months) - reviewing chart, she has had 15 lb weight loss in the past year.   Took 2 omeprazole and 2 zantac to treat pain. Also took zofran with benefit.  Avoids greasy foods, avoids fruits, avoids spicy foods.  No alcohol.  She does take BC powders intermittently.   Has not recently seen GI.  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 Mother currently in the hospital.  Just quit her job because she had to care for her mother.  High stress caring for mother so she doesn't go to nursing home.   H/o bleeding duodenal ulcer, GERD, chronic issue for the past 9 yrs.  EGD/colonoscopy 2016 reviewed (gastritis, duodenal ulcers).   Relevant past medical, surgical, family and social history reviewed and updated as indicated. Interim medical history since our last visit reviewed. Allergies and medications reviewed and updated. Outpatient Medications Prior to Visit  Medication Sig Dispense Refill  . cetirizine (ZYRTEC) 10 MG tablet Take 10 mg by mouth daily as needed for allergies.      . cyanocobalamin (,VITAMIN B-12,) 1000 MCG/ML injection Inject into the skin once monthly for 6 months for low vitamin B 12. 3 mL 1  . gabapentin (NEURONTIN) 100 MG capsule Take 1 capsule (100 mg total) by mouth 3 (three) times daily. 90 capsule 0  . levothyroxine (SYNTHROID, LEVOTHROID) 100 MCG tablet TAKE ONE TABLET DAILY BEFORE BREAKFAST. 30 tablet 11  . NONFORMULARY OR COMPOUNDED ITEM See pharmacy note 120 each 2  . omeprazole (PRILOSEC) 40 MG capsule Take 1 capsule (40 mg total) by mouth every morning. 90 capsule 1  . ranitidine (ZANTAC) 150 MG tablet Take 1 tablet by mouth at bedtime 90 tablet 1  . sertraline (ZOLOFT) 50 MG tablet TAKE 1 TABLET BY MOUTH ONCE A DAY 90 tablet 2  . SUMAtriptan (IMITREX) 25 MG tablet TAKE 1 TABLET EVERY 2 HOURS AS NEEDED FOR MIGRAINE, MAY REPEAT IN 2 HOURS IF HEADACHE PERSISTS OR RECURS 10 tablet 0  . SYRINGE-NEEDLE, DISP, 3 ML (B-D 3CC LUER-LOK SYR 25GX1") 25G X 1" 3 ML MISC Use as instructed to injection B12 every 30 days 10 each 0  . topiramate (TOPAMAX) 25 MG tablet Take 1 tablet by mouth at bedtime for migraine prevention. 90 tablet 1  . traMADol (ULTRAM) 50 MG tablet Take 1 tablet (50 mg total) by mouth every 6 (six) hours as needed for moderate pain. 10 tablet 0  . traZODone (  DESYREL) 150 MG tablet Take 1 tablet (150 mg total) by mouth at bedtime. 90 tablet 2   No facility-administered medications prior to visit.      Per HPI unless specifically indicated in ROS section below Review of Systems     Objective:    BP 112/60 (BP Location: Left Arm, Patient Position: Sitting, Cuff Size: Normal)   Pulse 80   Temp 97.9 F (36.6 C) (Oral)   Ht 5' (1.524 m)   Wt 104 lb 8 oz (47.4 kg)   SpO2 98%   BMI 20.41 kg/m   Wt Readings from Last 3 Encounters:  12/19/17 104 lb 8 oz (47.4 kg)  10/12/17 111 lb 8 oz (50.6 kg)  09/26/17 110 lb 4 oz (50 kg)    Physical Exam  Constitutional: She appears well-developed and well-nourished. No distress.  HENT:    Mouth/Throat: Oropharynx is clear and moist. No oropharyngeal exudate.  Cardiovascular: Normal rate, regular rhythm and normal heart sounds.  No murmur heard. Pulmonary/Chest: Effort normal and breath sounds normal. No respiratory distress. She has no wheezes. She has no rales.  Abdominal: Soft. Bowel sounds are normal. She exhibits no distension and no mass. There is no hepatosplenomegaly. There is tenderness (moderate) in the epigastric area. There is no rebound, no guarding, no CVA tenderness and negative Murphy's sign. No hernia.  Musculoskeletal: She exhibits no edema.  Psychiatric: She has a normal mood and affect.  Nursing note and vitals reviewed.  Results for orders placed or performed during the hospital encounter of 09/25/17  Comprehensive metabolic panel  Result Value Ref Range   Sodium 139 135 - 145 mmol/L   Potassium 4.1 3.5 - 5.1 mmol/L   Chloride 105 98 - 111 mmol/L   CO2 21 (L) 22 - 32 mmol/L   Glucose, Bld 117 (H) 70 - 99 mg/dL   BUN 31 (H) 6 - 20 mg/dL   Creatinine, Ser 1.17 (H) 0.44 - 1.00 mg/dL   Calcium 9.0 8.9 - 10.3 mg/dL   Total Protein 6.9 6.5 - 8.1 g/dL   Albumin 4.1 3.5 - 5.0 g/dL   AST 26 15 - 41 U/L   ALT 12 0 - 44 U/L   Alkaline Phosphatase 48 38 - 126 U/L   Total Bilirubin 0.6 0.3 - 1.2 mg/dL   GFR calc non Af Amer 52 (L) >60 mL/min   GFR calc Af Amer >60 >60 mL/min   Anion gap 13 5 - 15  CBC  Result Value Ref Range   WBC 12.1 (H) 3.6 - 11.0 K/uL   RBC 5.42 (H) 3.80 - 5.20 MIL/uL   Hemoglobin 16.4 (H) 12.0 - 16.0 g/dL   HCT 48.7 (H) 35.0 - 47.0 %   MCV 89.9 80.0 - 100.0 fL   MCH 30.3 26.0 - 34.0 pg   MCHC 33.7 32.0 - 36.0 g/dL   RDW 13.7 11.5 - 14.5 %   Platelets 383 150 - 440 K/uL  CBC with Differential  Result Value Ref Range   WBC 12.3 (H) 3.6 - 11.0 K/uL   RBC 4.86 3.80 - 5.20 MIL/uL   Hemoglobin 14.7 12.0 - 16.0 g/dL   HCT 44.2 35.0 - 47.0 %   MCV 90.9 80.0 - 100.0 fL   MCH 30.3 26.0 - 34.0 pg   MCHC 33.3 32.0 - 36.0 g/dL   RDW  13.7 11.5 - 14.5 %   Platelets 295 150 - 440 K/uL   Neutrophils Relative % 84 %   Neutro Abs  10.3 (H) 1.4 - 6.5 K/uL   Lymphocytes Relative 11 %   Lymphs Abs 1.3 1.0 - 3.6 K/uL   Monocytes Relative 5 %   Monocytes Absolute 0.6 0.2 - 0.9 K/uL   Eosinophils Relative 0 %   Eosinophils Absolute 0.0 0 - 0.7 K/uL   Basophils Relative 0 %   Basophils Absolute 0.0 0 - 0.1 K/uL  Type and screen Ordered by PROVIDER DEFAULT  Result Value Ref Range   ABO/RH(D) O POS    Antibody Screen NEG    Sample Expiration      09/28/2017 Performed at [facility], [address], [place], [place] [address]       Assessment & Plan:   Problem List Items Addressed This Visit    Hypothyroidism   Relevant Orders   TSH   GERD (gastroesophageal reflux disease)   Relevant Medications   sucralfate (CARAFATE) 1 g tablet   ondansetron (ZOFRAN) 4 MG tablet   Epigastric pain - Primary    Anticipate recurrent duodenal ulcer in h/o same. Treat with omeprazole and zantac BID, start carafate TID AC. Refilled ondansetron 4mg  PRN nausea. Discussed diet recommendations, handouts provided. Check labs including lipase to eval for pancreatitis. Given severity and history of recurrence, will refer to GI for further eval.  Last EGD 2016      Relevant Orders   Comprehensive metabolic panel   CBC with Differential/Platelet   Lipase   Ambulatory referral to Gastroenterology       Meds ordered this encounter  Medications  . sucralfate (CARAFATE) 1 g tablet    Sig: Take 1 tablet (1 g total) by mouth 3 (three) times daily before meals.    Dispense:  60 tablet    Refill:  1  . ondansetron (ZOFRAN) 4 MG tablet    Sig: Take 1 tablet (4 mg total) by mouth every 8 (eight) hours as needed for nausea or vomiting.    Dispense:  20 tablet    Refill:  0   Orders Placed This Encounter  Procedures  . Comprehensive metabolic panel  . CBC with Differential/Platelet  . Lipase  . TSH  . Ambulatory referral to  Gastroenterology    Referral Priority:   Routine    Referral Type:   Consultation    Referral Reason:   Specialty Services Required    Number of Visits Requested:   1    Follow up plan: Return if symptoms worsen or fail to improve.  Ria Bush, MD

## 2017-12-19 NOTE — Assessment & Plan Note (Signed)
Anticipate recurrent duodenal ulcer in h/o same. Treat with omeprazole and zantac BID, start carafate TID AC. Refilled ondansetron 4mg  PRN nausea. Discussed diet recommendations, handouts provided. Check labs including lipase to eval for pancreatitis. Given severity and history of recurrence, will refer to GI for further eval.  Last EGD 2016

## 2017-12-20 ENCOUNTER — Telehealth: Payer: Self-pay | Admitting: Primary Care

## 2017-12-20 NOTE — Telephone Encounter (Signed)
plz call - has she started carafate? I want her to take TID with meals and may take QHS if needed as well for the next week. Continue PPI and zantac BID.  This should help with her pain. If severe pain, probably needs to seek urgent evaluation.  Also see lab results note.

## 2017-12-20 NOTE — Telephone Encounter (Signed)
Spoke with pt relaying Dr. Synthia Innocent instructions and message. Pt verbalizes understanding and states she has started the carafate. Fyi to Dr. Darnell Level.

## 2017-12-20 NOTE — Telephone Encounter (Signed)
Copied from [place] [id number]. Topic: General - Other >> Dec 20, 2017 12:30 PM Keene Breath wrote: Reason for CRM: Patient called to request some pain medication because she stated that she is in a lot of pain and would like something called into the pharmacy.  Patient saw Dr. Danise Mina yesterday because Dr. Carlis Abbott was not available.  Please advise.  CB# [phone]

## 2017-12-21 ENCOUNTER — Ambulatory Visit: Payer: Self-pay | Admitting: *Deleted

## 2017-12-21 NOTE — Telephone Encounter (Signed)
Pt was seen on 12/19/17 and had TSH done. Do you want levothyroxine to stay at same dosage?Please advise.

## 2017-12-21 NOTE — Telephone Encounter (Signed)
Noted, refill sent to pharmacy. 

## 2017-12-21 NOTE — Telephone Encounter (Signed)
Left message for pt to return call to office to discuss symptoms of abdominal and back pain.

## 2017-12-21 NOTE — Telephone Encounter (Signed)
Copied from Rafael Hernandez [phone]. Topic: Quick Communication - Rx Refill/Question >> Dec 21, 2017  9:43 AM Oliver Pila B wrote: Medication: levothyroxine (SYNTHROID, LEVOTHROID) 100 MCG tablet [[id number]]    Has the patient contacted their pharmacy? Yes.   (Agent: If no, request that the patient contact the pharmacy for the refill.) (Agent: If yes, when and what did the pharmacy advise?)  Preferred Pharmacy (with phone number or street name): [name]   Agent: Please be advised that RX refills may take up to 3 business days. We ask that you follow-up with your pharmacy.

## 2017-12-22 NOTE — Telephone Encounter (Signed)
Message left for patient to return my call.  

## 2017-12-26 NOTE — Telephone Encounter (Signed)
Message left for patient to return my call.  

## 2018-01-09 ENCOUNTER — Other Ambulatory Visit: Payer: Self-pay

## 2018-01-09 ENCOUNTER — Encounter: Payer: Self-pay | Admitting: Gastroenterology

## 2018-01-09 ENCOUNTER — Encounter

## 2018-01-09 ENCOUNTER — Ambulatory Visit: Payer: BLUE CROSS/BLUE SHIELD | Admitting: Gastroenterology

## 2018-01-09 VITALS — BP 106/68 | HR 70 | Ht 60.0 in | Wt 104.0 lb

## 2018-01-09 DIAGNOSIS — R634 Abnormal weight loss: Secondary | ICD-10-CM | POA: Diagnosis not present

## 2018-01-09 DIAGNOSIS — G8929 Other chronic pain: Secondary | ICD-10-CM | POA: Diagnosis not present

## 2018-01-09 DIAGNOSIS — K5900 Constipation, unspecified: Secondary | ICD-10-CM | POA: Diagnosis not present

## 2018-01-09 DIAGNOSIS — R1013 Epigastric pain: Secondary | ICD-10-CM | POA: Diagnosis not present

## 2018-01-09 MED ORDER — NA SULFATE-K SULFATE-MG SULF 17.5-3.13-1.6 GM/177ML PO SOLN: 1.0000 | ORAL | 0 refills | Status: DC

## 2018-01-09 NOTE — Patient Instructions (Signed)
You scheduled for a CT abdomen/pelvis at [facility] on Thursday, Nov 14th at 8:00am. Please arrive at the medical mall registration desk at 7:45am. You cannot have anything to eat or drink 4 hours prior to scan. You must pick up contrast media today.   If you need to reschedule this appointment for any reason, please contact central scheduling at [phone].

## 2018-01-09 NOTE — Progress Notes (Signed)
Gastroenterology Consultation  Referring Provider:     Ria Bush, MD Primary Care Physician:  Pleas Koch, NP Primary Gastroenterologist:  Dr. Allen Norris     Reason for Consultation:     Epigastric pain        HPI:   Tanya Nguyen is a 52 y.o. y/o female referred for consultation & management of epigastric pain by Dr. Carlis Abbott, Leticia Penna, NP.  This patient comes in today after seeing me back in 2016 for nausea.  At that time the patient underwent an EGD and was found to have some reactive gastritis and duodenal ulcers.  The patient's H. pylori at that time was reported to be negative.  The patient recently started to have some abdominal pain and back pain and has been treated with a PPI, Zantac and Carafate was recently added.  The patient had labs sent off including lipase that were absolutely normal.  The patient had the abdominal pain start on October 13 and she was seen the next day by her PCP.  She reported at that time that she had vomited 4 times which was associated with abdominal pain.  She had reported at that time that she had lost 30 pounds in the first 4 months.  Now reports that she has lost another 10 pounds this month for a total of 40 pounds of weight loss.  She states that she has been eating well and is hungry but continues to lose weight.  There is no report of any black stools or bloody stools.  She does report that she has chronic constipation and only defecates once a week to once every 2 weeks.  Past Medical History:  Diagnosis Date  . Bleeding duodenal ulcer   . Depression   . GERD (gastroesophageal reflux disease)   . Hypothyroidism   . Kidney stones   . Migraine headache     Past Surgical History:  Procedure Laterality Date  . ABDOMINAL HYSTERECTOMY  2004   partial  . CHOLECYSTECTOMY    . TOOTH EXTRACTION      Prior to Admission medications   Medication Sig Start Date End Date Taking? Authorizing Provider  cetirizine (ZYRTEC) 10 MG tablet Take 10  mg by mouth daily as needed for allergies.     [provider]  cyanocobalamin (,VITAMIN B-12,) 1000 MCG/ML injection Inject into the skin once monthly for 6 months for low vitamin B 12. 03/31/17   Pleas Koch, NP  gabapentin (NEURONTIN) 100 MG capsule Take 1 capsule (100 mg total) by mouth 3 (three) times daily. 09/21/17   Edrick Kins, DPM  levothyroxine (SYNTHROID, LEVOTHROID) 100 MCG tablet Take 1 tablet by mouth every morning on an empty stomach with water. No food or other medications for 30 minutes. 12/21/17   Pleas Koch, NP  NONFORMULARY OR COMPOUNDED ITEM See pharmacy note 08/04/17   Edrick Kins, DPM  omeprazole (PRILOSEC) 40 MG capsule Take 1 capsule (40 mg total) by mouth every morning. 08/26/17   Copland, Frederico Hamman, MD  ondansetron (ZOFRAN) 4 MG tablet Take 1 tablet (4 mg total) by mouth every 8 (eight) hours as needed for nausea or vomiting. 12/19/17   Ria Bush, MD  ranitidine (ZANTAC) 150 MG tablet Take 1 tablet by mouth at bedtime 08/26/17   Copland, Frederico Hamman, MD  sertraline (ZOLOFT) 50 MG tablet TAKE 1 TABLET BY MOUTH ONCE A DAY 06/21/17   Pleas Koch, NP  sucralfate (CARAFATE) 1 g tablet Take 1 tablet (1  g total) by mouth 3 (three) times daily before meals. 12/19/17   Ria Bush, MD  SUMAtriptan (IMITREX) 25 MG tablet TAKE 1 TABLET EVERY 2 HOURS AS NEEDED FOR MIGRAINE, MAY REPEAT IN 2 HOURS IF HEADACHE PERSISTS OR RECURS 11/16/17   Pleas Koch, NP  SYRINGE-NEEDLE, DISP, 3 ML (B-D 3CC LUER-LOK SYR 25GX1") 25G X 1" 3 ML MISC Use as instructed to injection B12 every 30 days 03/31/17   Pleas Koch, NP  topiramate (TOPAMAX) 25 MG tablet Take 1 tablet by mouth at bedtime for migraine prevention. 12/06/17   Pleas Koch, NP  traMADol (ULTRAM) 50 MG tablet Take 1 tablet (50 mg total) by mouth every 6 (six) hours as needed for moderate pain. 10/13/17   Copland, Frederico Hamman, MD  traZODone (DESYREL) 150 MG tablet Take 1 tablet (150 mg total)  by mouth at bedtime. 06/21/17   Pleas Koch, NP    Family History  Problem Relation Age of Onset  . Cancer Mother   . Breast cancer Mother 95  . Cancer Father   . Alcohol abuse Sister   . Bipolar disorder Sister   . Alcohol abuse Maternal Grandmother   . Stroke Maternal Grandmother   . Stroke Paternal Grandmother      Social History   Tobacco Use  . Smoking status: Never Smoker  . Smokeless tobacco: Never Used  Substance Use Topics  . Alcohol use: Yes    Alcohol/week: 0.0 standard drinks  . Drug use: No    Allergies as of 01/09/2018 - Review Complete 12/19/2017  Allergen Reaction Noted  . Penicillins Shortness Of Breath, Diarrhea, and Nausea And Vomiting 02/26/2015  . Sulfa antibiotics Nausea And Vomiting 06/26/2012    Review of Systems:    All systems reviewed and negative except where noted in HPI.   Physical Exam:  There were no vitals taken for this visit. No LMP recorded. Patient has had a hysterectomy. General:   Alert,  Well-developed, well-nourished, pleasant and cooperative in NAD Head:  Normocephalic and atraumatic. Eyes:  Sclera clear, no icterus.   Conjunctiva pink. Ears:  Normal auditory acuity. Nose:  No deformity, discharge, or lesions. Mouth:  No deformity or lesions,oropharynx pink & moist. Neck:  Supple; no masses or thyromegaly. Lungs:  Respirations even and unlabored.  Clear throughout to auscultation.   No wheezes, crackles, or rhonchi. No acute distress. Heart:  Regular rate and rhythm; no murmurs, clicks, rubs, or gallops. Abdomen:  Normal bowel sounds.  No bruits.  Soft, non-tender and non-distended without masses, hepatosplenomegaly or hernias noted.  No guarding or rebound tenderness.  Negative Carnett sign.   Rectal:  Deferred.  Msk:  Symmetrical without gross deformities.  Good, equal movement & strength bilaterally. Pulses:  Normal pulses noted. Extremities:  No clubbing or edema.  No cyanosis. Neurologic:  Alert and oriented x3;   grossly normal neurologically. Skin:  Intact without significant lesions or rashes.  No jaundice. Lymph Nodes:  No significant cervical adenopathy. Psych:  Alert and cooperative. Normal mood and affect.  Imaging Studies: No results found.  Assessment and Plan:   Tanya Nguyen is a 52 y.o. y/o female who has chronic constipation and a history of peptic ulcer disease with abdominal pain throughout the abdominal area both in the epigastric area and on the flanks.  The patient's symptoms are not consistent with musculoskeletal pain.  The patient has had a significant weight loss of 40 pounds in the last 5 months.  The patient will be  set up for a EGD and colonoscopy.  The patient also had a CT scan of the abdomen back in 2014 at [facility] which showed dilation of the common bile duct.  The patient will have a repeat CT scan of the abdomen due to her abdominal pain and weight loss.  The patient has also been told to take MiraLAX every day to every other day so that she moves her bowels more frequently. The patient has been explained the plan and agrees with it.  [name], MD. [name]    Note: This dictation was prepared with Dragon dictation along with smaller phrase technology. Any transcriptional errors that result from this process are unintentional.

## 2018-01-10 ENCOUNTER — Encounter: Payer: Self-pay | Admitting: *Deleted

## 2018-01-10 ENCOUNTER — Other Ambulatory Visit: Payer: Self-pay

## 2018-01-10 DIAGNOSIS — G8929 Other chronic pain: Secondary | ICD-10-CM

## 2018-01-10 DIAGNOSIS — R634 Abnormal weight loss: Secondary | ICD-10-CM

## 2018-01-10 DIAGNOSIS — R1013 Epigastric pain: Secondary | ICD-10-CM

## 2018-01-11 ENCOUNTER — Telehealth: Payer: Self-pay | Admitting: Gastroenterology

## 2018-01-11 NOTE — Discharge Instructions (Signed)
General Anesthesia, Adult, Care After °These instructions provide you with information about caring for yourself after your procedure. Your health care provider may also give you more specific instructions. Your treatment has been planned according to current medical practices, but problems sometimes occur. Call your health care provider if you have any problems or questions after your procedure. °What can I expect after the procedure? °After the procedure, it is common to have: °· Vomiting. °· A sore throat. °· Mental slowness. ° °It is common to feel: °· Nauseous. °· Cold or shivery. °· Sleepy. °· Tired. °· Sore or achy, even in parts of your body where you did not have surgery. ° °Follow these instructions at home: °For at least 24 hours after the procedure: °· Do not: °? Participate in activities where you could fall or become injured. °? Drive. °? Use heavy machinery. °? Drink alcohol. °? Take sleeping pills or medicines that cause drowsiness. °? Make important decisions or sign legal documents. °? Take care of children on your own. °· Rest. °Eating and drinking °· If you vomit, drink water, juice, or soup when you can drink without vomiting. °· Drink enough fluid to keep your urine clear or pale yellow. °· Make sure you have little or no nausea before eating solid foods. °· Follow the diet recommended by your health care provider. °General instructions °· Have a responsible adult stay with you until you are awake and alert. °· Return to your normal activities as told by your health care provider. Ask your health care provider what activities are safe for you. °· Take over-the-counter and prescription medicines only as told by your health care provider. °· If you smoke, do not smoke without supervision. °· Keep all follow-up visits as told by your health care provider. This is important. °Contact a health care provider if: °· You continue to have nausea or vomiting at home, and medicines are not helpful. °· You  cannot drink fluids or start eating again. °· You cannot urinate after 8-12 hours. °· You develop a skin rash. °· You have fever. °· You have increasing redness at the site of your procedure. °Get help right away if: °· You have difficulty breathing. °· You have chest pain. °· You have unexpected bleeding. °· You feel that you are having a life-threatening or urgent problem. °This information is not intended to replace advice given to you by your health care provider. Make sure you discuss any questions you have with your health care provider. °Document Released: 05/31/2000 Document Revised: 07/28/2015 Document Reviewed: 02/06/2015 °Elsevier Interactive Patient Education © 2018 [employer] ° °

## 2018-01-11 NOTE — Telephone Encounter (Signed)
Patient called & states she is having procedures tomorrow & want to make Dr Allen Norris aware she doe not normally have a bowel movement but every 3-4 months. She was embarrassed to saw anything at the office visit.

## 2018-01-12 ENCOUNTER — Ambulatory Visit: Payer: BLUE CROSS/BLUE SHIELD | Admitting: Anesthesiology

## 2018-01-12 ENCOUNTER — Ambulatory Visit
Admission: RE | Admit: 2018-01-12 | Discharge: 2018-01-12 | Disposition: A | Discharge status: Home / Self Care | Payer: BLUE CROSS/BLUE SHIELD | Source: Ambulatory Visit | Attending: Gastroenterology | Admitting: Gastroenterology

## 2018-01-12 ENCOUNTER — Encounter
Admission: RE | Disposition: A | Discharge status: Home / Self Care | Payer: Self-pay | Source: Ambulatory Visit | Attending: Gastroenterology

## 2018-01-12 DIAGNOSIS — K219 Gastro-esophageal reflux disease without esophagitis: Secondary | ICD-10-CM | POA: Insufficient documentation

## 2018-01-12 DIAGNOSIS — K2950 Unspecified chronic gastritis without bleeding: Secondary | ICD-10-CM | POA: Insufficient documentation

## 2018-01-12 DIAGNOSIS — Z811 Family history of alcohol abuse and dependence: Secondary | ICD-10-CM | POA: Insufficient documentation

## 2018-01-12 DIAGNOSIS — K315 Obstruction of duodenum: Secondary | ICD-10-CM | POA: Diagnosis not present

## 2018-01-12 DIAGNOSIS — G43909 Migraine, unspecified, not intractable, without status migrainosus: Secondary | ICD-10-CM | POA: Diagnosis not present

## 2018-01-12 DIAGNOSIS — R634 Abnormal weight loss: Secondary | ICD-10-CM

## 2018-01-12 DIAGNOSIS — Z681 Body mass index (BMI) 19 or less, adult: Secondary | ICD-10-CM | POA: Diagnosis not present

## 2018-01-12 DIAGNOSIS — Z90710 Acquired absence of both cervix and uterus: Secondary | ICD-10-CM | POA: Diagnosis not present

## 2018-01-12 DIAGNOSIS — Z823 Family history of stroke: Secondary | ICD-10-CM | POA: Diagnosis not present

## 2018-01-12 DIAGNOSIS — Z882 Allergy status to sulfonamides status: Secondary | ICD-10-CM | POA: Insufficient documentation

## 2018-01-12 DIAGNOSIS — E039 Hypothyroidism, unspecified: Secondary | ICD-10-CM | POA: Insufficient documentation

## 2018-01-12 DIAGNOSIS — F319 Bipolar disorder, unspecified: Secondary | ICD-10-CM | POA: Diagnosis not present

## 2018-01-12 DIAGNOSIS — K259 Gastric ulcer, unspecified as acute or chronic, without hemorrhage or perforation: Secondary | ICD-10-CM | POA: Diagnosis not present

## 2018-01-12 DIAGNOSIS — Z9049 Acquired absence of other specified parts of digestive tract: Secondary | ICD-10-CM | POA: Insufficient documentation

## 2018-01-12 DIAGNOSIS — Z87442 Personal history of urinary calculi: Secondary | ICD-10-CM | POA: Diagnosis not present

## 2018-01-12 DIAGNOSIS — Z818 Family history of other mental and behavioral disorders: Secondary | ICD-10-CM | POA: Diagnosis not present

## 2018-01-12 DIAGNOSIS — R1084 Generalized abdominal pain: Secondary | ICD-10-CM | POA: Diagnosis not present

## 2018-01-12 DIAGNOSIS — Z808 Family history of malignant neoplasm of other organs or systems: Secondary | ICD-10-CM | POA: Diagnosis not present

## 2018-01-12 DIAGNOSIS — Z79899 Other long term (current) drug therapy: Secondary | ICD-10-CM | POA: Insufficient documentation

## 2018-01-12 DIAGNOSIS — K257 Chronic gastric ulcer without hemorrhage or perforation: Secondary | ICD-10-CM | POA: Diagnosis not present

## 2018-01-12 DIAGNOSIS — Z880 Allergy status to penicillin: Secondary | ICD-10-CM | POA: Insufficient documentation

## 2018-01-12 DIAGNOSIS — K5900 Constipation, unspecified: Secondary | ICD-10-CM | POA: Diagnosis not present

## 2018-01-12 DIAGNOSIS — Z803 Family history of malignant neoplasm of breast: Secondary | ICD-10-CM | POA: Diagnosis not present

## 2018-01-12 DIAGNOSIS — F419 Anxiety disorder, unspecified: Secondary | ICD-10-CM | POA: Diagnosis not present

## 2018-01-12 HISTORY — PX: ESOPHAGOGASTRODUODENOSCOPY (EGD) WITH PROPOFOL: SHX5813

## 2018-01-12 SURGERY — ESOPHAGOGASTRODUODENOSCOPY (EGD) WITH PROPOFOL
Anesthesia: General | Site: Throat

## 2018-01-12 MED ORDER — PROPOFOL 10 MG/ML IV BOLUS
INTRAVENOUS | Status: DC | PRN
  Administered 2018-01-12: 80 mg via INTRAVENOUS
  Administered 2018-01-12: 10 mg via INTRAVENOUS
  Administered 2018-01-12: 20 mg via INTRAVENOUS
  Administered 2018-01-12: 10 mg via INTRAVENOUS
  Administered 2018-01-12: 20 mg via INTRAVENOUS
  Administered 2018-01-12: 10 mg via INTRAVENOUS
  Administered 2018-01-12: 20 mg via INTRAVENOUS

## 2018-01-12 MED ORDER — LIDOCAINE HCL (CARDIAC) PF 100 MG/5ML IV SOSY
PREFILLED_SYRINGE | INTRAVENOUS | Status: DC | PRN
  Administered 2018-01-12: 40 mg via INTRAVENOUS

## 2018-01-12 MED ORDER — GLYCOPYRROLATE 0.2 MG/ML IJ SOLN
INTRAMUSCULAR | Status: DC | PRN
  Administered 2018-01-12: 0.2 mg via INTRAVENOUS

## 2018-01-12 MED ORDER — LACTATED RINGERS IV SOLN
INTRAVENOUS | Status: DC
  Administered 2018-01-12: 08:00:00 via INTRAVENOUS

## 2018-01-12 MED ORDER — DEXMEDETOMIDINE HCL 200 MCG/2ML IV SOLN
INTRAVENOUS | Status: DC | PRN
  Administered 2018-01-12: 8 ug via INTRAVENOUS

## 2018-01-12 SURGICAL SUPPLY — 7 items
BLOCK BITE 60FR ADLT L/F GRN (MISCELLANEOUS) ×3 IMPLANT
CANISTER SUCT 1200ML W/VALVE (MISCELLANEOUS) ×3 IMPLANT
FORCEPS BIOP RAD 4 LRG CAP 4 (CUTTING FORCEPS) ×3 IMPLANT
GOWN CVR UNV OPN BCK APRN NK (MISCELLANEOUS) ×4 IMPLANT
GOWN ISOL THUMB LOOP REG UNIV (MISCELLANEOUS) ×2
KIT ENDO PROCEDURE OLY (KITS) ×3 IMPLANT
WATER STERILE IRR 250ML POUR (IV SOLUTION) ×3 IMPLANT

## 2018-01-12 NOTE — Anesthesia Preprocedure Evaluation (Signed)
Anesthesia Evaluation  Patient identified by MRN, date of birth, ID band Patient awake    Reviewed: Allergy & Precautions, H&P , NPO status , Patient's Chart, lab work & pertinent test results  Airway Mallampati: II  TM Distance: >3 FB Neck ROM: full    Dental no notable dental hx.    Pulmonary    Pulmonary exam normal breath sounds clear to auscultation       Cardiovascular Normal cardiovascular exam Rhythm:regular Rate:Normal     Neuro/Psych PSYCHIATRIC DISORDERS Anxiety Depression Bipolar Disorder    GI/Hepatic PUD, GERD  ,  Endo/Other  Hypothyroidism   Renal/GU      Musculoskeletal   Abdominal   Peds  Hematology   Anesthesia Other Findings   Reproductive/Obstetrics                             Anesthesia Physical Anesthesia Plan  ASA: II  Anesthesia Plan: General   Post-op Pain Management:    Induction: Intravenous  PONV Risk Score and Plan: 3 and Propofol infusion and Treatment may vary due to age or medical condition  Airway Management Planned: Natural Airway  Additional Equipment:   Intra-op Plan:   Post-operative Plan:   Informed Consent: I have reviewed the patients History and Physical, chart, labs and discussed the procedure including the risks, benefits and alternatives for the proposed anesthesia with the patient or authorized representative who has indicated his/her understanding and acceptance.     Plan Discussed with: CRNA  Anesthesia Plan Comments:         Anesthesia Quick Evaluation

## 2018-01-12 NOTE — Anesthesia Procedure Notes (Signed)
Date/Time: 01/12/2018 8:22 AM Performed by: Janna Arch, CRNA Pre-anesthesia Checklist: Patient identified, Emergency Drugs available, Suction available, Timeout performed and Patient being monitored Patient Re-evaluated:Patient Re-evaluated prior to induction Oxygen Delivery Method: Nasal cannula Placement Confirmation: positive ETCO2

## 2018-01-12 NOTE — Op Note (Signed)
[facility] Gastroenterology Patient Name: Tanya Nguyen Procedure Date: 01/12/2018 8:20 AM MRN: [id number] Account #: [id number] Date of Birth: 03/04/1966 Admit Type: Outpatient Age: 52 Room: [facility] OR ROOM 01 Gender: Female Note Status: Finalized Procedure:            Upper GI endoscopy Indications:          Generalized abdominal pain, Weight loss Providers:            [name] MD, MD Referring MD:         Pleas Koch (Referring MD) Medicines:            Propofol per Anesthesia Complications:        No immediate complications. Procedure:            Pre-Anesthesia Assessment:                       - Prior to the procedure, a History and Physical was                        performed, and patient medications and allergies were                        reviewed. The patient's tolerance of previous                        anesthesia was also reviewed. The risks and benefits of                        the procedure and the sedation options and risks were                        discussed with the patient. All questions were                        answered, and informed consent was obtained. Prior                        Anticoagulants: The patient has taken no previous                        anticoagulant or antiplatelet agents. ASA Grade                        Assessment: II - A patient with mild systemic disease.                        After reviewing the risks and benefits, the patient was                        deemed in satisfactory condition to undergo the                        procedure.                       After obtaining informed consent, the endoscope was                        passed under direct vision. Throughout the procedure,  the patient's blood pressure, pulse, and oxygen                        saturations were monitored continuously. The was                        introduced through the mouth, and advanced to the               duodenal bulb. The upper GI endoscopy was accomplished                        without difficulty. The patient tolerated the procedure                        well. Findings:      The examined esophagus was normal.      A large amount of food (residue) was found in the entire examined       stomach.      One non-bleeding cratered gastric ulcer was found at the pylorus.       Biopsies were taken with a cold forceps for histology.      An acquired severe stenosis was found in the duodenal bulb and was       non-traversed. Impression:           - Normal esophagus.                       - A large amount of food (residue) in the stomach.                       - Non-bleeding gastric ulcer. Biopsied.                       - Acquired duodenal stenosis.                       .- Colonoscopy not done due to poor prep. Recommendation:       - Discharge patient to home.                       - Resume previous diet.                       - Continue present medications.                       - Await pathology results.                       - Do an upper GI series.                       - Perform CT scan (computed tomography) of the abdomen                        with contrast.                       - Perform CT scan (computed tomography) of the pelvis                        with contrast. Procedure Code(s):    --- Professional ---  63016, Esophagogastroduodenoscopy, flexible, transoral;                        with biopsy, single or multiple Diagnosis Code(s):    --- Professional ---                       R10.84, Generalized abdominal pain                       R63.4, Abnormal weight loss                       K31.5, Obstruction of duodenum                       K25.9, Gastric ulcer, unspecified as acute or chronic,                        without hemorrhage or perforation CPT copyright 2018 American Medical Association. All rights reserved. The codes documented in  this report are preliminary and upon coder review may  be revised to meet current compliance requirements. [name] MD, MD 01/12/2018 8:44:43 AM This report has been signed electronically. Number of Addenda: 0 Note Initiated On: 01/12/2018 8:20 AM Total Procedure Duration: 0 hours 1 minute 3 seconds       [facility]

## 2018-01-12 NOTE — OR Nursing (Signed)
Unable to perform colonoscopy due to colon full of stool.

## 2018-01-12 NOTE — Anesthesia Postprocedure Evaluation (Signed)
Anesthesia Post Note  Patient: Kerissa Coia  Procedure(s) Performed: ESOPHAGOGASTRODUODENOSCOPY (EGD) WITH BIOPSIES (N/A Throat)  Patient location during evaluation: PACU Anesthesia Type: General Level of consciousness: awake and alert and oriented Pain management: satisfactory to patient Vital Signs Assessment: post-procedure vital signs reviewed and stable Respiratory status: spontaneous breathing, nonlabored ventilation and respiratory function stable Cardiovascular status: blood pressure returned to baseline and stable Postop Assessment: Adequate PO intake and No signs of nausea or vomiting Anesthetic complications: no    Raliegh Ip

## 2018-01-12 NOTE — Transfer of Care (Signed)
Immediate Anesthesia Transfer of Care Note  Patient: Tanya Nguyen  Procedure(s) Performed: ESOPHAGOGASTRODUODENOSCOPY (EGD) WITH BIOPSIES (N/A Throat)  Patient Location: PACU  Anesthesia Type: General  Level of Consciousness: awake, alert  and patient cooperative  Airway and Oxygen Therapy: Patient Spontanous Breathing and Patient connected to supplemental oxygen  Post-op Assessment: Post-op Vital signs reviewed, Patient's Cardiovascular Status Stable, Respiratory Function Stable, Patent Airway and No signs of Nausea or vomiting  Post-op Vital Signs: Reviewed and stable  Complications: No apparent anesthesia complications

## 2018-01-12 NOTE — H&P (Signed)
[name], MD [facility] [address], [place] [address], [place] [address] Phone:[phone] Fax : [phone]  Primary Care Physician:  Pleas Koch, NP Primary Gastroenterologist:  Dr. Allen Norris  Pre-Procedure History & Physical: HPI:  Ashna Dorough is a 52 y.o. female is here for an endoscopy and colonoscopy.   Past Medical History:  Diagnosis Date  . Bleeding duodenal ulcer   . Depression   . GERD (gastroesophageal reflux disease)   . Hypothyroidism   . Kidney stones   . Migraine headache    only1-2x/month since starting topamax    Past Surgical History:  Procedure Laterality Date  . ABDOMINAL HYSTERECTOMY  2004   partial  . CHOLECYSTECTOMY    . TOOTH EXTRACTION      Prior to Admission medications   Medication Sig Start Date End Date Taking? Authorizing Provider  cetirizine (ZYRTEC) 10 MG tablet Take 10 mg by mouth daily as needed for allergies.    Yes [provider]  levothyroxine (SYNTHROID, LEVOTHROID) 100 MCG tablet Take 1 tablet by mouth every morning on an empty stomach with water. No food or other medications for 30 minutes. 12/21/17  Yes Pleas Koch, NP  Na Sulfate-K Sulfate-Mg Sulf (SUPREP BOWEL PREP KIT) 17.5-3.13-1.6 GM/177ML SOLN Take 1 kit by mouth as directed. 01/09/18  Yes [name], MD  omeprazole (PRILOSEC) 40 MG capsule Take 1 capsule (40 mg total) by mouth every morning. 08/26/17  Yes Copland, Frederico Hamman, MD  ranitidine (ZANTAC) 150 MG tablet Take 1 tablet by mouth at bedtime 08/26/17  Yes Copland, Frederico Hamman, MD  sertraline (ZOLOFT) 50 MG tablet TAKE 1 TABLET BY MOUTH ONCE A DAY 06/21/17  Yes Pleas Koch, NP  sucralfate (CARAFATE) 1 g tablet Take 1 tablet (1 g total) by mouth 3 (three) times daily before meals. 12/19/17  Yes Ria Bush, MD  SUMAtriptan (IMITREX) 25 MG tablet TAKE 1 TABLET EVERY 2 HOURS AS NEEDED FOR MIGRAINE, MAY REPEAT IN 2 HOURS IF HEADACHE PERSISTS OR RECURS 11/16/17  Yes Pleas Koch, NP  topiramate  (TOPAMAX) 25 MG tablet Take 1 tablet by mouth at bedtime for migraine prevention. 12/06/17  Yes Pleas Koch, NP  traMADol (ULTRAM) 50 MG tablet Take 1 tablet (50 mg total) by mouth every 6 (six) hours as needed for moderate pain. 10/13/17  Yes Copland, Frederico Hamman, MD  traZODone (DESYREL) 150 MG tablet Take 1 tablet (150 mg total) by mouth at bedtime. 06/21/17  Yes Pleas Koch, NP  cyanocobalamin (,VITAMIN B-12,) 1000 MCG/ML injection Inject into the skin once monthly for 6 months for low vitamin B 12. Patient not taking: Reported on 01/09/2018 03/31/17   Pleas Koch, NP  gabapentin (NEURONTIN) 100 MG capsule Take 1 capsule (100 mg total) by mouth 3 (three) times daily. Patient not taking: Reported on 01/10/2018 09/21/17   Edrick Kins, DPM  NONFORMULARY OR COMPOUNDED ITEM See pharmacy note Patient not taking: Reported on 01/10/2018 08/04/17   Edrick Kins, DPM  ondansetron (ZOFRAN) 4 MG tablet Take 1 tablet (4 mg total) by mouth every 8 (eight) hours as needed for nausea or vomiting. Patient not taking: Reported on 01/10/2018 12/19/17   Ria Bush, MD  SYRINGE-NEEDLE, DISP, 3 ML (B-D 3CC LUER-LOK SYR 25GX1") 25G X 1" 3 ML MISC Use as instructed to injection B12 every 30 days Patient not taking: Reported on 01/12/2018 03/31/17   Pleas Koch, NP    Allergies as of 01/10/2018 - Review Complete 01/10/2018  Allergen Reaction Noted  .  Penicillins Shortness Of Breath, Diarrhea, and Nausea And Vomiting 02/26/2015  . Sulfa antibiotics Nausea And Vomiting 06/26/2012    Family History  Problem Relation Age of Onset  . Cancer Mother   . Breast cancer Mother 73  . Cancer Father   . Alcohol abuse Sister   . Bipolar disorder Sister   . Alcohol abuse Maternal Grandmother   . Stroke Maternal Grandmother   . Stroke Paternal Grandmother     Social History   Socioeconomic History  . Marital status: Married    Spouse name: Not on file  . Number of children: Not on file  .  Years of education: Not on file  . Highest education level: Not on file  Occupational History  . Not on file  Social Needs  . Financial resource strain: Not on file  . Food insecurity:    Worry: Not on file    Inability: Not on file  . Transportation needs:    Medical: Not on file    Non-medical: Not on file  Tobacco Use  . Smoking status: Never Smoker  . Smokeless tobacco: Never Used  Substance and Sexual Activity  . Alcohol use: Not Currently    Alcohol/week: 0.0 standard drinks  . Drug use: No  . Sexual activity: Yes    Birth control/protection: None  Lifestyle  . Physical activity:    Days per week: Not on file    Minutes per session: Not on file  . Stress: Not on file  Relationships  . Social connections:    Talks on phone: Not on file    Gets together: Not on file    Attends religious service: Not on file    Active member of club or organization: Not on file    Attends meetings of clubs or organizations: Not on file    Relationship status: Not on file  . Intimate partner violence:    Fear of current or ex partner: Not on file    Emotionally abused: Not on file    Physically abused: Not on file    Forced sexual activity: Not on file  Other Topics Concern  . Not on file  Social History Narrative  . Not on file    Review of Systems: See HPI, otherwise negative ROS  Physical Exam: BP 95/63   Pulse 75   Temp (!) 97.5 F (36.4 C) (Temporal)   Resp 15   Ht 5' (1.524 m)   Wt 46.3 kg   SpO2 100%   BMI 19.92 kg/m  General:   Alert,  pleasant and cooperative in NAD Head:  Normocephalic and atraumatic. Neck:  Supple; no masses or thyromegaly. Lungs:  Clear throughout to auscultation.    Heart:  Regular rate and rhythm. Abdomen:  Soft, nontender and nondistended. Normal bowel sounds, without guarding, and without rebound.   Neurologic:  Alert and  oriented x4;  grossly normal neurologically.  Impression/Plan: Starlene Consuegra is here for an endoscopy and  colonoscopy to be performed for constipation, weight loss and abd. Pan.  Risks, benefits, limitations, and alternatives regarding  endoscopy and colonoscopy have been reviewed with the patient.  Questions have been answered.  All parties agreeable.   [name], MD  01/12/2018, 7:52 AM

## 2018-01-13 ENCOUNTER — Encounter: Payer: Self-pay | Admitting: Gastroenterology

## 2018-01-13 ENCOUNTER — Other Ambulatory Visit: Payer: Self-pay

## 2018-01-13 MED ORDER — PANTOPRAZOLE SODIUM 40 MG PO TBEC: 40.0000 mg | DELAYED_RELEASE_TABLET | Freq: Two times a day (BID) | ORAL | 11 refills | Status: DC

## 2018-01-17 LAB — SURGICAL PATHOLOGY

## 2018-01-18 ENCOUNTER — Telehealth: Payer: Self-pay | Admitting: Gastroenterology

## 2018-01-18 ENCOUNTER — Encounter: Payer: Self-pay | Admitting: Gastroenterology

## 2018-01-18 NOTE — Telephone Encounter (Signed)
Pt is calling to check if ct scan has been approved by her insurance please call pt

## 2018-01-19 ENCOUNTER — Encounter: Payer: Self-pay | Admitting: Emergency Medicine

## 2018-01-19 ENCOUNTER — Inpatient Hospital Stay
Admission: EM | Admit: 2018-01-19 | Discharge: 2018-01-30 | DRG: 326 | Disposition: A | Discharge status: Home Health | Payer: BLUE CROSS/BLUE SHIELD | Attending: Surgery | Admitting: Surgery

## 2018-01-19 ENCOUNTER — Emergency Department: Payer: Self-pay

## 2018-01-19 ENCOUNTER — Ambulatory Visit
Admission: RE | Admit: 2018-01-19 | Discharge: 2018-01-19 | Disposition: A | Discharge status: Home / Self Care | Payer: BLUE CROSS/BLUE SHIELD | Source: Ambulatory Visit | Attending: Gastroenterology | Admitting: Gastroenterology

## 2018-01-19 ENCOUNTER — Inpatient Hospital Stay: Payer: BLUE CROSS/BLUE SHIELD

## 2018-01-19 ENCOUNTER — Other Ambulatory Visit: Payer: Self-pay

## 2018-01-19 DIAGNOSIS — N200 Calculus of kidney: Secondary | ICD-10-CM

## 2018-01-19 DIAGNOSIS — Z6823 Body mass index (BMI) 23.0-23.9, adult: Secondary | ICD-10-CM

## 2018-01-19 DIAGNOSIS — F3130 Bipolar disorder, current episode depressed, mild or moderate severity, unspecified: Secondary | ICD-10-CM | POA: Diagnosis present

## 2018-01-19 DIAGNOSIS — E876 Hypokalemia: Secondary | ICD-10-CM | POA: Diagnosis present

## 2018-01-19 DIAGNOSIS — K274 Chronic or unspecified peptic ulcer, site unspecified, with hemorrhage: Secondary | ICD-10-CM | POA: Diagnosis not present

## 2018-01-19 DIAGNOSIS — K9189 Other postprocedural complications and disorders of digestive system: Secondary | ICD-10-CM

## 2018-01-19 DIAGNOSIS — Z880 Allergy status to penicillin: Secondary | ICD-10-CM

## 2018-01-19 DIAGNOSIS — F4310 Post-traumatic stress disorder, unspecified: Secondary | ICD-10-CM | POA: Diagnosis present

## 2018-01-19 DIAGNOSIS — K311 Adult hypertrophic pyloric stenosis: Principal | ICD-10-CM | POA: Diagnosis present

## 2018-01-19 DIAGNOSIS — Z803 Family history of malignant neoplasm of breast: Secondary | ICD-10-CM | POA: Diagnosis not present

## 2018-01-19 DIAGNOSIS — E43 Unspecified severe protein-calorie malnutrition: Secondary | ICD-10-CM

## 2018-01-19 DIAGNOSIS — I959 Hypotension, unspecified: Secondary | ICD-10-CM | POA: Diagnosis not present

## 2018-01-19 DIAGNOSIS — Z978 Presence of other specified devices: Secondary | ICD-10-CM

## 2018-01-19 DIAGNOSIS — D62 Acute posthemorrhagic anemia: Secondary | ICD-10-CM | POA: Diagnosis not present

## 2018-01-19 DIAGNOSIS — E039 Hypothyroidism, unspecified: Secondary | ICD-10-CM | POA: Diagnosis present

## 2018-01-19 DIAGNOSIS — G43909 Migraine, unspecified, not intractable, without status migrainosus: Secondary | ICD-10-CM | POA: Diagnosis present

## 2018-01-19 DIAGNOSIS — M5137 Other intervertebral disc degeneration, lumbosacral region: Secondary | ICD-10-CM

## 2018-01-19 DIAGNOSIS — K920 Hematemesis: Secondary | ICD-10-CM | POA: Diagnosis not present

## 2018-01-19 DIAGNOSIS — Z882 Allergy status to sulfonamides status: Secondary | ICD-10-CM

## 2018-01-19 DIAGNOSIS — Z823 Family history of stroke: Secondary | ICD-10-CM | POA: Diagnosis not present

## 2018-01-19 DIAGNOSIS — R634 Abnormal weight loss: Secondary | ICD-10-CM | POA: Insufficient documentation

## 2018-01-19 DIAGNOSIS — G8929 Other chronic pain: Secondary | ICD-10-CM | POA: Diagnosis present

## 2018-01-19 DIAGNOSIS — K922 Gastrointestinal hemorrhage, unspecified: Secondary | ICD-10-CM | POA: Diagnosis not present

## 2018-01-19 HISTORY — DX: Adult hypertrophic pyloric stenosis: K31.1

## 2018-01-19 LAB — URINALYSIS, COMPLETE (UACMP) WITH MICROSCOPIC
Bacteria, UA: NONE SEEN
Bilirubin Urine: NEGATIVE
GLUCOSE, UA: NEGATIVE mg/dL
Hgb urine dipstick: NEGATIVE
Ketones, ur: NEGATIVE mg/dL
Leukocytes, UA: NEGATIVE
Nitrite: NEGATIVE
PH: 8 (ref 5.0–8.0)
Protein, ur: NEGATIVE mg/dL
SPECIFIC GRAVITY, URINE: 1.026 (ref 1.005–1.030)
SQUAMOUS EPITHELIAL / LPF: NONE SEEN (ref 0–5)

## 2018-01-19 LAB — CBC WITH DIFFERENTIAL/PLATELET
Abs Immature Granulocytes: 0.01 10*3/uL (ref 0.00–0.07)
BASOS ABS: 0 10*3/uL (ref 0.0–0.1)
Basophils Relative: 1 %
EOS PCT: 1 %
Eosinophils Absolute: 0.1 10*3/uL (ref 0.0–0.5)
HCT: 39.1 % (ref 36.0–46.0)
Hemoglobin: 13.5 g/dL (ref 12.0–15.0)
IMMATURE GRANULOCYTES: 0 %
LYMPHS PCT: 28 %
Lymphs Abs: 1.2 10*3/uL (ref 0.7–4.0)
MCH: 30.2 pg (ref 26.0–34.0)
MCHC: 34.5 g/dL (ref 30.0–36.0)
MCV: 87.5 fL (ref 80.0–100.0)
Monocytes Absolute: 0.4 10*3/uL (ref 0.1–1.0)
Monocytes Relative: 9 %
NEUTROS ABS: 2.7 10*3/uL (ref 1.7–7.7)
NRBC: 0 % (ref 0.0–0.2)
Neutrophils Relative %: 61 %
Platelets: 239 10*3/uL (ref 150–400)
RBC: 4.47 MIL/uL (ref 3.87–5.11)
RDW: 12.4 % (ref 11.5–15.5)
WBC: 4.4 10*3/uL (ref 4.0–10.5)

## 2018-01-19 LAB — HEPATIC FUNCTION PANEL
ALBUMIN: 4.1 g/dL (ref 3.5–5.0)
ALT: 12 U/L (ref 0–44)
AST: 18 U/L (ref 15–41)
Alkaline Phosphatase: 52 U/L (ref 38–126)
Bilirubin, Direct: <0.1 mg/dL (ref 0.0–0.2)
TOTAL PROTEIN: 6.8 g/dL (ref 6.5–8.1)
Total Bilirubin: 0.4 mg/dL (ref 0.3–1.2)

## 2018-01-19 LAB — PROTIME-INR
INR: 1.06
PROTHROMBIN TIME: 13.7 s (ref 11.4–15.2)

## 2018-01-19 LAB — BASIC METABOLIC PANEL
Anion gap: 10 (ref 5–15)
BUN: 14 mg/dL (ref 6–20)
CHLORIDE: 103 mmol/L (ref 98–111)
CO2: 28 mmol/L (ref 22–32)
Calcium: 9 mg/dL (ref 8.9–10.3)
Creatinine, Ser: 0.78 mg/dL (ref 0.44–1.00)
GFR calc Af Amer: >60 mL/min (ref >60)
GFR calc non Af Amer: >60 mL/min (ref >60)
Glucose, Bld: 106 mg/dL — ABNORMAL HIGH (ref 70–99)
POTASSIUM: 3.1 mmol/L — AB (ref 3.5–5.1)
SODIUM: 141 mmol/L (ref 135–145)

## 2018-01-19 LAB — TYPE AND SCREEN
ABO/RH(D): O POS
ANTIBODY SCREEN: NEGATIVE

## 2018-01-19 LAB — LIPASE, BLOOD: LIPASE: 40 U/L (ref 11–51)

## 2018-01-19 MED ORDER — ONDANSETRON HCL 4 MG/2ML IJ SOLN
4.0000 mg | Freq: Four times a day (QID) | INTRAMUSCULAR | Status: DC | PRN
  Administered 2018-01-22 – 2018-01-30 (×12): 4 mg via INTRAVENOUS
  Filled 2018-01-19 (×12): qty 2

## 2018-01-19 MED ORDER — LEVOTHYROXINE SODIUM 100 MCG IV SOLR
50.0000 ug | Freq: Every day | INTRAVENOUS | Status: DC
  Administered 2018-01-19 – 2018-01-26 (×8): 50 ug via INTRAVENOUS
  Filled 2018-01-19 (×9): qty 5

## 2018-01-19 MED ORDER — ACETAMINOPHEN 325 MG PO TABS: 325.0000 mg | ORAL_TABLET | Freq: Once | ORAL | Status: DC

## 2018-01-19 MED ORDER — HYDROMORPHONE HCL 1 MG/ML IJ SOLN
0.5000 mg | INTRAMUSCULAR | Status: DC | PRN
  Administered 2018-01-19 – 2018-01-21 (×6): 0.5 mg via INTRAVENOUS
  Filled 2018-01-19 (×6): qty 0.5

## 2018-01-19 MED ORDER — ONDANSETRON HCL 4 MG/2ML IJ SOLN: 4.0000 mg | Freq: Four times a day (QID) | INTRAMUSCULAR | Status: DC | PRN

## 2018-01-19 MED ORDER — KETOROLAC TROMETHAMINE 30 MG/ML IJ SOLN: 30.0000 mg | Freq: Four times a day (QID) | INTRAMUSCULAR | Status: DC | PRN

## 2018-01-19 MED ORDER — SODIUM CHLORIDE 0.9% FLUSH
10.0000 mL | Freq: Two times a day (BID) | INTRAVENOUS | Status: DC
  Administered 2018-01-20 – 2018-01-28 (×12): 10 mL
  Administered 2018-01-28: 20 mL
  Administered 2018-01-29 – 2018-01-30 (×2): 10 mL

## 2018-01-19 MED ORDER — KCL-LACTATED RINGERS-D5W 20 MEQ/L IV SOLN
INTRAVENOUS | Status: DC
  Filled 2018-01-19 (×3): qty 1000

## 2018-01-19 MED ORDER — ENOXAPARIN SODIUM 40 MG/0.4ML ~~LOC~~ SOLN: 40.0000 mg | SUBCUTANEOUS | Status: DC

## 2018-01-19 MED ORDER — MORPHINE SULFATE (PF) 4 MG/ML IV SOLN
4.0000 mg | Freq: Once | INTRAVENOUS | Status: AC
  Administered 2018-01-19: 4 mg via INTRAVENOUS
  Filled 2018-01-19: qty 1

## 2018-01-19 MED ORDER — KETOROLAC TROMETHAMINE 30 MG/ML IJ SOLN: 30.0000 mg | Freq: Four times a day (QID) | INTRAMUSCULAR | Status: DC

## 2018-01-19 MED ORDER — ONDANSETRON 4 MG PO TBDP
4.0000 mg | ORAL_TABLET | Freq: Four times a day (QID) | ORAL | Status: DC | PRN
  Administered 2018-01-26 – 2018-01-30 (×4): 4 mg via ORAL
  Filled 2018-01-19 (×4): qty 1

## 2018-01-19 MED ORDER — ACETAMINOPHEN 160 MG/5ML PO SOLN
325.0000 mg | Freq: Once | ORAL | Status: DC
  Filled 2018-01-19: qty 20.3

## 2018-01-19 MED ORDER — PANTOPRAZOLE SODIUM 40 MG IV SOLR: 40.0000 mg | Freq: Every day | INTRAVENOUS | Status: DC

## 2018-01-19 MED ORDER — ENOXAPARIN SODIUM 40 MG/0.4ML ~~LOC~~ SOLN
40.0000 mg | SUBCUTANEOUS | Status: DC
  Administered 2018-01-19 – 2018-01-22 (×4): 40 mg via SUBCUTANEOUS
  Filled 2018-01-19 (×4): qty 0.4

## 2018-01-19 MED ORDER — FENTANYL CITRATE (PF) 100 MCG/2ML IJ SOLN
50.0000 ug | Freq: Once | INTRAMUSCULAR | Status: AC
  Administered 2018-01-19: 50 ug via INTRAVENOUS
  Filled 2018-01-19: qty 2

## 2018-01-19 MED ORDER — METOPROLOL TARTRATE 5 MG/5ML IV SOLN: 5.0000 mg | Freq: Four times a day (QID) | INTRAVENOUS | Status: DC | PRN

## 2018-01-19 MED ORDER — HYDRALAZINE HCL 20 MG/ML IJ SOLN: 10.0000 mg | INTRAMUSCULAR | Status: DC | PRN

## 2018-01-19 MED ORDER — SODIUM CHLORIDE 0.9 % IV BOLUS
500.0000 mL | Freq: Once | INTRAVENOUS | Status: AC
  Administered 2018-01-19: 500 mL via INTRAVENOUS

## 2018-01-19 MED ORDER — MORPHINE SULFATE (PF) 2 MG/ML IV SOLN
2.0000 mg | INTRAVENOUS | Status: DC | PRN
  Administered 2018-01-19: 2 mg via INTRAVENOUS
  Filled 2018-01-19: qty 1

## 2018-01-19 MED ORDER — PANTOPRAZOLE SODIUM 40 MG IV SOLR
40.0000 mg | Freq: Every day | INTRAVENOUS | Status: DC
  Administered 2018-01-19 – 2018-01-22 (×4): 40 mg via INTRAVENOUS
  Filled 2018-01-19 (×5): qty 40

## 2018-01-19 MED ORDER — SODIUM CHLORIDE 0.9% FLUSH
10.0000 mL | INTRAVENOUS | Status: DC | PRN
  Administered 2018-01-21: 10 mL
  Filled 2018-01-19: qty 40

## 2018-01-19 MED ORDER — LACTATED RINGERS IV SOLN
125.0000 mL/h | INTRAVENOUS | Status: DC
  Administered 2018-01-19 – 2018-01-20 (×3): 125 mL/h via INTRAVENOUS

## 2018-01-19 MED ORDER — KETOROLAC TROMETHAMINE 30 MG/ML IJ SOLN
30.0000 mg | Freq: Four times a day (QID) | INTRAMUSCULAR | Status: DC
  Administered 2018-01-19 – 2018-01-20 (×4): 30 mg via INTRAVENOUS
  Filled 2018-01-19 (×3): qty 1

## 2018-01-19 MED ORDER — IOHEXOL 300 MG/ML  SOLN
75.0000 mL | Freq: Once | INTRAMUSCULAR | Status: AC | PRN
  Administered 2018-01-19: 75 mL via INTRAVENOUS

## 2018-01-19 MED ORDER — ONDANSETRON 4 MG PO TBDP: 4.0000 mg | ORAL_TABLET | Freq: Four times a day (QID) | ORAL | Status: DC | PRN

## 2018-01-19 MED ORDER — MUPIROCIN 2 % EX OINT
1.0000 "application " | TOPICAL_OINTMENT | Freq: Two times a day (BID) | CUTANEOUS | Status: AC
  Administered 2018-01-19 – 2018-01-21 (×5): 1 via NASAL
  Filled 2018-01-19: qty 22

## 2018-01-19 NOTE — Progress Notes (Signed)
TPN will be started tomorrow per pharmacist. PICC line in placed.

## 2018-01-19 NOTE — H&P (Signed)
[facility] Admission Note  Tanya Nguyen [birth year]  [id number].    Requesting MD: Dr. Charlotte Crumb, MD Chief Complaint/Reason for Consult: Gastric Outlet Obstruction  HPI:  Tanya Nguyen is a 52 y.o. female who presents to the ED this afternoon for abdominal pain. She notes that she has had about a 12 year history of abdominal pain however this pain is acutely worse. She notes the pain is diffuse throughout her abdomen and cramping in nature. Nothing makes the pain better. Additionally, she reports early satiety, nausea, and emesis most recently this morning. Of note, she has been following with Dr Allen Norris who preformed endoscopy on 11/07. 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 The patient does endorse significant NSAID use. Surgical pathology was obtained which was not concerning for cancerous process. She denied any fever, chills, CP, SOB, urinary changes. She does note she has had irregular bowel movements her entire life and goes "once every few months." Work up in the ED was concerning for gastric outlet obstruction.   General surgery is consulted by emergency medicine physician Dr Charlotte Crumb, MD for evaluation and management of gastric outlet obstruction   ROS: Review of Systems  Constitutional: Positive for weight loss. Negative for chills and fever.  Respiratory: Negative for cough and shortness of breath.   Cardiovascular: Negative for chest pain and leg swelling.  Gastrointestinal: Positive for abdominal pain, constipation, nausea and vomiting. Negative for blood in stool, diarrhea and heartburn.  Genitourinary: Negative for dysuria and urgency.  All other systems reviewed and are negative.   Family History  Problem Relation Age of Onset  . Cancer Mother   . Breast cancer Mother 60  . Cancer Father   . Alcohol abuse Sister   . Bipolar disorder Sister   . Alcohol abuse Maternal  Grandmother   . Stroke Maternal Grandmother   . Stroke Paternal Grandmother     Past Medical History:  Diagnosis Date  . Bleeding duodenal ulcer   . Depression   . GERD (gastroesophageal reflux disease)   . Hypothyroidism   . Kidney stones   . Migraine headache    only1-2x/month since starting topamax    Past Surgical History:  Procedure Laterality Date  . ABDOMINAL HYSTERECTOMY  2004   partial  . CHOLECYSTECTOMY    . ESOPHAGOGASTRODUODENOSCOPY (EGD) WITH PROPOFOL N/A 01/12/2018   Procedure: ESOPHAGOGASTRODUODENOSCOPY (EGD) WITH BIOPSIES;  Surgeon: [name], MD;  Location: [facility];  Service: Endoscopy;  Laterality: N/A;  . TOOTH EXTRACTION      Social History:  reports that she has never smoked. She has never used smokeless tobacco. She reports that she drank alcohol. She reports that she does not use drugs.  Allergies:  Allergies  Allergen Reactions  . Penicillins Shortness Of Breath, Diarrhea and Nausea And Vomiting    Has patient had a PCN reaction causing immediate rash, facial/tongue/throat swelling, SOB or lightheadedness with hypotension: yes Has patient had a PCN reaction causing severe rash involving mucus membranes or skin necrosis: no Has patient had a PCN reaction that required hospitalization no Has patient had a PCN reaction occurring within the last 10 years: about 10 years If all of the above answers are "NO", then may proceed with Cephalosporin use.   . Sulfa Antibiotics Nausea And Vomiting    Other reaction(s): VOMITING     (Not in a hospital admission)  Blood pressure (!) 115/95, pulse 94, temperature 97.7 F (36.5 C),  temperature source Oral, resp. rate 18, height 4' 7"  (1.397 m), weight 46.3 kg, SpO2 100 %. Physical Exam: Physical Exam  Constitutional: She is oriented to person, place, and time. She appears well-developed and well-nourished. She appears distressed.  Patient is very tearful Patient states multiple times her pain  "is above a 10"  HENT:  Head: Normocephalic and atraumatic.  Eyes: Conjunctivae are normal. No scleral icterus.  Neck: Normal range of motion.  Cardiovascular: Normal rate and regular rhythm. Exam reveals no friction rub.  No murmur heard. Pulmonary/Chest: Effort normal. No respiratory distress. She has no wheezes.  Abdominal: Soft. There is generalized tenderness. There is no rigidity and no guarding.  Patient becomes tearful to light palpation of the abdomen however do not appreciate rigidity or rebound  Genitourinary:  Genitourinary Comments: Deferred  Musculoskeletal: She exhibits no edema.  Neurological: She is alert and oriented to person, place, and time.  Skin: Skin is warm and dry.    Results for orders placed or performed during the hospital encounter of 01/19/18 (from the past 48 hour(s))  Basic metabolic panel     Status: Abnormal   Collection Time: 01/19/18  2:24 PM  Result Value Ref Range   Sodium 141 135 - 145 mmol/L   Potassium 3.1 (L) 3.5 - 5.1 mmol/L   Chloride 103 98 - 111 mmol/L   CO2 28 22 - 32 mmol/L   Glucose, Bld 106 (H) 70 - 99 mg/dL   BUN 14 6 - 20 mg/dL   Creatinine, Ser 0.78 0.44 - 1.00 mg/dL   Calcium 9.0 8.9 - 10.3 mg/dL   GFR calc non Af Amer >60 >60 mL/min   GFR calc Af Amer >60 >60 mL/min    Comment: (NOTE) The eGFR has been calculated using the CKD EPI equation. This calculation has not been validated in all clinical situations. eGFR's persistently <60 mL/min signify possible Chronic Kidney Disease.    Anion gap 10 5 - 15    Comment: Performed at [facility], [address] [place], [place] [address]  CBC with Differential     Status: None   Collection Time: 01/19/18  2:24 PM  Result Value Ref Range   WBC 4.4 4.0 - 10.5 K/uL   RBC 4.47 3.87 - 5.11 MIL/uL   Hemoglobin 13.5 12.0 - 15.0 g/dL   HCT 39.1 36.0 - 46.0 %   MCV 87.5 80.0 - 100.0 fL   MCH 30.2 26.0 - 34.0 pg   MCHC 34.5 30.0 - 36.0 g/dL   RDW 12.4 11.5 - 15.5 %    Platelets 239 150 - 400 K/uL   nRBC 0.0 0.0 - 0.2 %   Neutrophils Relative % 61 %   Neutro Abs 2.7 1.7 - 7.7 K/uL   Lymphocytes Relative 28 %   Lymphs Abs 1.2 0.7 - 4.0 K/uL   Monocytes Relative 9 %   Monocytes Absolute 0.4 0.1 - 1.0 K/uL   Eosinophils Relative 1 %   Eosinophils Absolute 0.1 0.0 - 0.5 K/uL   Basophils Relative 1 %   Basophils Absolute 0.0 0.0 - 0.1 K/uL   Immature Granulocytes 0 %   Abs Immature Granulocytes 0.01 0.00 - 0.07 K/uL    Comment: Performed at [facility], [address] [place], [place] [address]  Hepatic function panel     Status: None   Collection Time: 01/19/18  2:24 PM  Result Value Ref Range   Total Protein 6.8 6.5 - 8.1 g/dL   Albumin  4.1 3.5 - 5.0 g/dL   AST 18 15 - 41 U/L   ALT 12 0 - 44 U/L   Alkaline Phosphatase 52 38 - 126 U/L   Total Bilirubin 0.4 0.3 - 1.2 mg/dL   Bilirubin, Direct <0.1 0.0 - 0.2 mg/dL   Indirect Bilirubin NOT CALCULATED 0.3 - 0.9 mg/dL    Comment: Performed at [facility], [address] [place], [place] [address]  Lipase, blood     Status: None   Collection Time: 01/19/18  2:24 PM  Result Value Ref Range   Lipase 40 11 - 51 U/L    Comment: Performed at [facility], [address] [place], [place] [address]  Protime-INR     Status: None   Collection Time: 01/19/18  2:24 PM  Result Value Ref Range   Prothrombin Time 13.7 11.4 - 15.2 seconds   INR 1.06     Comment: Performed at [facility], [address] [place], [place] [address]  Type and screen [place]     Status: None   Collection Time: 01/19/18  2:24 PM  Result Value Ref Range   ABO/RH(D) O POS    Antibody Screen NEG    Sample Expiration      01/22/2018 Performed at [facility], [address] [place], [place] [address]    Ct Abdomen Pelvis W Contrast  Result Date: 01/19/2018 CLINICAL DATA:  Unintended weight loss of 50 pounds in 6 months, constipation, some nausea and  vomiting EXAM: CT ABDOMEN AND PELVIS WITH CONTRAST TECHNIQUE: Multidetector CT imaging of the abdomen and pelvis was performed using the standard protocol following bolus administration of intravenous contrast. CONTRAST:  33m OMNIPAQUE IOHEXOL 300 MG/ML  SOLN COMPARISON:  KUB of 04/08/2014 FINDINGS: Lower chest: The lung bases are clear. The heart is within normal limits in size. No pericardial effusion is seen. Hepatobiliary: The liver enhances and there are 2 low-attenuation structures 1 medially in the right lobe and 1 anteriorly in the left lobe both of which are most typical of a benign process but cannot be well assessed on this CT. On delayed images, both of these lesions remain low attenuation probably representing small hepatic cysts of doubtful clinical significance. If further assessment is warranted, ultrasound would be helpful. No ductal dilatation is seen. The gallbladder has previously been resected with surgical clips noted. Pancreas: The pancreas is normal in size and the pancreatic duct is not dilated. Spleen: The spleen is unremarkable. Adrenals/Urinary Tract: The adrenal glands appear normal. The kidneys enhance and there do appear to be small bilateral renal calculi more numerous in the lower pole of the right kidney without obstruction. None of these calculi is larger than 4-5 mm in diameter. No hydronephrosis is seen. The ureters appear normal in caliber. The urinary bladder is not well distended but no abnormality is seen. Stomach/Bowel: The stomach is massively dilated with contrast and considerable food debris. Is there any suspicion of gastric outlet obstruction? No definite mass is seen. The small bowel is not dilated. There is a moderate amount of feces throughout the entire colon. The terminal ileum is difficult to adequately visualized but no edema or mass is seen. The appendix may extend posteriorly, but it is not optimally visualized. No inflammatory process is noted however within  the right lower quadrant. Vascular/Lymphatic: The abdominal aorta is normal in caliber. No adenopathy is seen. Reproductive: The uterus has previously been resected. No adnexal lesion is seen. There is a  small amount of fluid within the pelvis. Other: No abdominal wall hernia is noted. Musculoskeletal: The lumbar vertebrae are in normal alignment. There is degenerative disc disease at L5-S1 where there is some loss of disc space, sclerosis, spurring, and vacuum disc phenomenon present. The SI joints appear corticated. IMPRESSION: 1. Massive distention of the stomach with considerable retained fluid and food debris. Is there any clinical suspicion of gastric outlet obstruction? No definite mass is seen. 2. Bilateral renal calculi of no more than 4-5 mm in diameter. No present obstruction. 3. Degenerative disc disease at L5-S1. 4. Probable small cysts within the liver. Consider ultrasound if further assessment is warranted. Electronically Signed   By: Ivar Drape M.D.   On: 01/19/2018 10:15      Assessment/Plan  Gastric Outlet Obstruction  Beckett Hickmon is a 52 y.o. female with gastric outlet obstruction with significant gastric distension, likely bezoar, and pyloric edema, stenosis, and ulceration with mild hypokalemia and nausea/emesis which is complicated by pertinent co-morbidities including bipolar 1 disorder, depression, GERD, hypothyroidism, and a history bleeding duodenal ulcers.   - Admit to general surgery  - NPO, IVF  - NGT Decompression  - Pain control, Anti-emetics  - Discussed case with Dr. Hampton Abbot, patient will require exploratory laparotomy with diverting  Gastrojejunostomy given gastric outlet obstruction. Tentative plan is for tomorrow afternoon. Dr. Hampton Abbot will see the patient to discuss details and timing.   - Will consult for PICC placement as patient will likely require TPN following surgery  - Pre-op labs to include albumin and pre-albumin.   - Hypokalemia, replete and  monitor  - Mobilize as tolerates  - DVT Prophylaxis   -- Edison Simon, PA-C [facility] 01/19/2018, 3:43 PM [phone] M-F: 7am - 4pm

## 2018-01-19 NOTE — Telephone Encounter (Signed)
CT scan for this pt has been approved. Pt made aware.

## 2018-01-19 NOTE — Progress Notes (Signed)
PHARMACY - ADULT TOTAL PARENTERAL NUTRITION CONSULT NOTE   Pharmacy Consult for TPN Indication: Bowel Obstruction  Received consult for TPN late afternoon past cut off time. Will plan to start tomorrow after dietician has time to assess. PICC line has been placed.  Will follow up in the morning.  Paulina Fusi, PharmD, BCPS 01/19/2018 9:30 PM

## 2018-01-19 NOTE — Telephone Encounter (Signed)
-----   Message from [name], MD sent at 01/19/2018 11:31 AM EST ----- The patient appears to have gastric outlet obstruction and should probably had to the ER for admission and possible surgery.

## 2018-01-19 NOTE — Progress Notes (Signed)
Peripherally Inserted Central Catheter/Midline Placement  The IV Nurse has discussed with the patient and/or persons authorized to consent for the patient, the purpose of this procedure and the potential benefits and risks involved with this procedure.  The benefits include less needle sticks, lab draws from the catheter, and the patient may be discharged home with the catheter. Risks include, but not limited to, infection, bleeding, blood clot (thrombus formation), and puncture of an artery; nerve damage and irregular heartbeat and possibility to perform a PICC exchange if needed/ordered by physician.  Alternatives to this procedure were also discussed.  Bard Power PICC patient education guide, fact sheet on infection prevention and patient information card has been provided to patient /or left at bedside.    PICC/Midline Placement Documentation  PICC Double Lumen 64/33/29 PICC Right Basilic 32 cm 1 cm (Active)  Indication for Insertion or Continuance of Line Administration of hyperosmolar/irritating solutions (i.e. TPN, Vancomycin, etc.) 01/19/2018  7:20 PM  Exposed Catheter (cm) 1 cm 01/19/2018  7:20 PM  Site Assessment Clean;Dry;Intact 01/19/2018  7:20 PM  Lumen #1 Status Flushed;Saline locked;Blood return noted 01/19/2018  7:20 PM  Lumen #2 Status Flushed;Saline locked;Blood return noted 01/19/2018  7:20 PM  Dressing Type Transparent 01/19/2018  7:20 PM  Dressing Status Clean;Dry;Intact;Antimicrobial disc in place 01/19/2018  7:20 PM  Line Care Connections checked and tightened 01/19/2018  7:20 PM  Line Adjustment (NICU/IV Team Only) No 01/19/2018  7:20 PM  Dressing Intervention New dressing 01/19/2018  7:20 PM  Dressing Change Due 01/26/18 01/19/2018  7:20 PM       Rolena Infante 01/19/2018, 7:21 PM

## 2018-01-19 NOTE — Telephone Encounter (Signed)
Pt has been notified to go to the ER per Dr. Allen Norris.

## 2018-01-19 NOTE — ED Triage Notes (Signed)
Pt to ED from home states had CT scan this morning and results called in with bowel obstruction.  Pt states abd pain, nausea with vomiting x1 today.  Pt states lost approx 40lbs in 5 months, last ate last night.  Pt seen by Dr. Verl Blalock.

## 2018-01-19 NOTE — ED Provider Notes (Signed)
[facility] Emergency Department Provider Note  ____________________________________________   I have reviewed the triage vital signs and the nursing notes. Where available I have reviewed prior notes and, if possible and indicated, outside hospital notes.    HISTORY  Chief Complaint SBO    HPI Tanya Nguyen is a 52 y.o. female   who presents today complaining of chronic abdominal pain is lost 50 pounds over the last year.  Patient states that she has early satiety, and has occasional vomiting.  She states she did vomit x1 today.  No diarrhea.  She states that she has chronic epigastric abdominal pain.  I did discuss with Dr.Wohl her GI doctor.  He did recently, in the last week and a half do an endoscopy he was unable to do a colonoscopy.  The endoscopy showed a bezoar of food with very significant distention, there was a ulceration near the pyloric exit with inflammatory changes resulting in a acquired stricture, he was unable to further address this he did take a sample which showed on surgical path no obvious evidence of oncologic process although this is not been definitively ruled out he states.  I did talk to Dr. Verl Blalock about this.  Patient has, crampy pain that is significant, is a better nothing makes it worse no significant radiation no fevers, no dysuria, the patient also has admitted to her GI doctor that she has been significantly taking nonsteroidal medications on a daily basis in large quantities according to him.   Past Medical History:  Diagnosis Date  . Bleeding duodenal ulcer   . Depression   . GERD (gastroesophageal reflux disease)   . Hypothyroidism   . Kidney stones   . Migraine headache    only1-2x/month since starting topamax    Patient Active Problem List   Diagnosis Date Noted  . Abdominal pain, generalized   . Duodenal obstruction   . Chronic esophagogastric ulcer   . Epigastric pain 12/19/2017  . Vitamin D deficiency 07/28/2017  .  Vitamin B 12 deficiency 07/28/2017  . Chronic low back pain 07/28/2017  . Plantar fascial fibromatosis of both feet 11/10/2016  . Bipolar I disorder, most recent episode depressed ([place]) 03/01/2015  . PTSD (post-traumatic stress disorder) 02/27/2015  . Constipation 04/02/2014  . Fatigue 12/13/2013  . Insomnia 08/30/2013  . Loss of weight 03/22/2013  . GERD (gastroesophageal reflux disease)   . Migraines   . Hypothyroidism   . Bleeding duodenal ulcer     Past Surgical History:  Procedure Laterality Date  . ABDOMINAL HYSTERECTOMY  2004   partial  . CHOLECYSTECTOMY    . ESOPHAGOGASTRODUODENOSCOPY (EGD) WITH PROPOFOL N/A 01/12/2018   Procedure: ESOPHAGOGASTRODUODENOSCOPY (EGD) WITH BIOPSIES;  Surgeon: [name], MD;  Location: [facility];  Service: Endoscopy;  Laterality: N/A;  . TOOTH EXTRACTION      Prior to Admission medications   Medication Sig Start Date End Date Taking? Authorizing Provider  cetirizine (ZYRTEC) 10 MG tablet Take 10 mg by mouth daily as needed for allergies.     [provider]  cyanocobalamin (,VITAMIN B-12,) 1000 MCG/ML injection Inject into the skin once monthly for 6 months for low vitamin B 12. Patient not taking: Reported on 01/09/2018 03/31/17   Pleas Koch, NP  gabapentin (NEURONTIN) 100 MG capsule Take 1 capsule (100 mg total) by mouth 3 (three) times daily. Patient not taking: Reported on 01/10/2018 09/21/17   Edrick Kins, DPM  levothyroxine (SYNTHROID, LEVOTHROID) 100 MCG tablet Take 1 tablet  by mouth every morning on an empty stomach with water. No food or other medications for 30 minutes. 12/21/17   Pleas Koch, NP  Na Sulfate-K Sulfate-Mg Sulf (SUPREP BOWEL PREP KIT) 17.5-3.13-1.6 GM/177ML SOLN Take 1 kit by mouth as directed. 01/09/18   [name], MD  NONFORMULARY OR COMPOUNDED ITEM See pharmacy note Patient not taking: Reported on 01/10/2018 08/04/17   Edrick Kins, DPM  omeprazole (PRILOSEC) 40 MG capsule Take 1  capsule (40 mg total) by mouth every morning. 08/26/17   Copland, Frederico Hamman, MD  ondansetron (ZOFRAN) 4 MG tablet Take 1 tablet (4 mg total) by mouth every 8 (eight) hours as needed for nausea or vomiting. Patient not taking: Reported on 01/10/2018 12/19/17   Ria Bush, MD  pantoprazole (PROTONIX) 40 MG tablet Take 1 tablet (40 mg total) by mouth 2 (two) times daily. 01/13/18   [name], MD  ranitidine (ZANTAC) 150 MG tablet Take 1 tablet by mouth at bedtime 08/26/17   Copland, Frederico Hamman, MD  sertraline (ZOLOFT) 50 MG tablet TAKE 1 TABLET BY MOUTH ONCE A DAY 06/21/17   Pleas Koch, NP  sucralfate (CARAFATE) 1 g tablet Take 1 tablet (1 g total) by mouth 3 (three) times daily before meals. 12/19/17   Ria Bush, MD  SUMAtriptan (IMITREX) 25 MG tablet TAKE 1 TABLET EVERY 2 HOURS AS NEEDED FOR MIGRAINE, MAY REPEAT IN 2 HOURS IF HEADACHE PERSISTS OR RECURS 11/16/17   Pleas Koch, NP  SYRINGE-NEEDLE, DISP, 3 ML (B-D 3CC LUER-LOK SYR 25GX1") 25G X 1" 3 ML MISC Use as instructed to injection B12 every 30 days Patient not taking: Reported on 01/12/2018 03/31/17   Pleas Koch, NP  topiramate (TOPAMAX) 25 MG tablet Take 1 tablet by mouth at bedtime for migraine prevention. 12/06/17   Pleas Koch, NP  traMADol (ULTRAM) 50 MG tablet Take 1 tablet (50 mg total) by mouth every 6 (six) hours as needed for moderate pain. 10/13/17   Copland, Frederico Hamman, MD  traZODone (DESYREL) 150 MG tablet Take 1 tablet (150 mg total) by mouth at bedtime. 06/21/17   Pleas Koch, NP    Allergies Penicillins and Sulfa antibiotics  Family History  Problem Relation Age of Onset  . Cancer Mother   . Breast cancer Mother 42  . Cancer Father   . Alcohol abuse Sister   . Bipolar disorder Sister   . Alcohol abuse Maternal Grandmother   . Stroke Maternal Grandmother   . Stroke Paternal Grandmother     Social History Social History   Tobacco Use  . Smoking status: Never Smoker  . Smokeless  tobacco: Never Used  Substance Use Topics  . Alcohol use: Not Currently    Alcohol/week: 0.0 standard drinks  . Drug use: No    Review of Systems Constitutional: No fever/chills Eyes: No visual changes. ENT: No sore throat. No stiff neck no neck pain Cardiovascular: Denies chest pain. Respiratory: Denies shortness of breath. Gastrointestinal:   + vomiting.  No diarrhea.  No constipation. Genitourinary: Negative for dysuria. Musculoskeletal: Negative lower extremity swelling Skin: Negative for rash. Neurological: Negative for severe headaches, focal weakness or numbness.   ____________________________________________   PHYSICAL EXAM:  VITAL SIGNS: ED Triage Vitals  Enc Vitals Group     BP 01/19/18 1400 (!) 115/95     Pulse Rate 01/19/18 1400 94     Resp 01/19/18 1400 18     Temp 01/19/18 1400 97.7 F (36.5 C)     Temp  Source 01/19/18 1400 Oral     SpO2 01/19/18 1400 100 %     Weight 01/19/18 1400 102 lb (46.3 kg)     Height 01/19/18 1400 4' 7"  (1.397 m)     Head Circumference --      Peak Flow --      Pain Score 01/19/18 1408 10     Pain Loc --      Pain Edu? --      Excl. in [place]? --     Constitutional: Alert and oriented. Well appearing and in no acute distress. Eyes: Conjunctivae are normal Head: Atraumatic HEENT: No congestion/rhinnorhea. Mucous membranes are moist.  Oropharynx non-erythematous Neck:   Nontender with no meningismus, no masses, no stridor Cardiovascular: Normal rate, regular rhythm. Grossly normal heart sounds.  Good peripheral circulation. Respiratory: Normal respiratory effort.  No retractions. Lungs CTAB. Abdominal: Soft and positive gastric tenderness and fullness,.  Voluntary guarding no rebound not peritoneal Back:  There is no focal tenderness or step off.  there is no midline tenderness there are no lesions noted. there is no CVA tenderness Musculoskeletal: No lower extremity tenderness, no upper extremity tenderness. No joint effusions,  no DVT signs strong distal pulses no edema Neurologic:  Normal speech and language. No gross focal neurologic deficits are appreciated.  Skin:  Skin is warm, dry and intact. No rash noted. Psychiatric: Mood and affect are normal. Speech and behavior are normal.  ____________________________________________   LABS (all labs ordered are listed, but only abnormal results are displayed)  Labs Reviewed  BASIC METABOLIC PANEL  CBC WITH DIFFERENTIAL/PLATELET  HEPATIC FUNCTION PANEL  LIPASE, BLOOD  PROTIME-INR  URINALYSIS, COMPLETE (UACMP) WITH MICROSCOPIC  TYPE AND SCREEN    Pertinent labs  results that were available during my care of the patient were reviewed by me and considered in my medical decision making (see chart for details). ____________________________________________  EKG  I personally interpreted any EKGs ordered by me or triage  ____________________________________________  RADIOLOGY  Pertinent labs & imaging results that were available during my care of the patient were reviewed by me and considered in my medical decision making (see chart for details). If possible, patient and/or family made aware of any abnormal findings.  Ct Abdomen Pelvis W Contrast  Result Date: 01/19/2018 CLINICAL DATA:  Unintended weight loss of 50 pounds in 6 months, constipation, some nausea and vomiting EXAM: CT ABDOMEN AND PELVIS WITH CONTRAST TECHNIQUE: Multidetector CT imaging of the abdomen and pelvis was performed using the standard protocol following bolus administration of intravenous contrast. CONTRAST:  40m OMNIPAQUE IOHEXOL 300 MG/ML  SOLN COMPARISON:  KUB of 04/08/2014 FINDINGS: Lower chest: The lung bases are clear. The heart is within normal limits in size. No pericardial effusion is seen. Hepatobiliary: The liver enhances and there are 2 low-attenuation structures 1 medially in the right lobe and 1 anteriorly in the left lobe both of which are most typical of a benign process but  cannot be well assessed on this CT. On delayed images, both of these lesions remain low attenuation probably representing small hepatic cysts of doubtful clinical significance. If further assessment is warranted, ultrasound would be helpful. No ductal dilatation is seen. The gallbladder has previously been resected with surgical clips noted. Pancreas: The pancreas is normal in size and the pancreatic duct is not dilated. Spleen: The spleen is unremarkable. Adrenals/Urinary Tract: The adrenal glands appear normal. The kidneys enhance and there do appear to be small bilateral renal calculi more numerous in the  lower pole of the right kidney without obstruction. None of these calculi is larger than 4-5 mm in diameter. No hydronephrosis is seen. The ureters appear normal in caliber. The urinary bladder is not well distended but no abnormality is seen. Stomach/Bowel: The stomach is massively dilated with contrast and considerable food debris. Is there any suspicion of gastric outlet obstruction? No definite mass is seen. The small bowel is not dilated. There is a moderate amount of feces throughout the entire colon. The terminal ileum is difficult to adequately visualized but no edema or mass is seen. The appendix may extend posteriorly, but it is not optimally visualized. No inflammatory process is noted however within the right lower quadrant. Vascular/Lymphatic: The abdominal aorta is normal in caliber. No adenopathy is seen. Reproductive: The uterus has previously been resected. No adnexal lesion is seen. There is a small amount of fluid within the pelvis. Other: No abdominal wall hernia is noted. Musculoskeletal: The lumbar vertebrae are in normal alignment. There is degenerative disc disease at L5-S1 where there is some loss of disc space, sclerosis, spurring, and vacuum disc phenomenon present. The SI joints appear corticated. IMPRESSION: 1. Massive distention of the stomach with considerable retained fluid and  food debris. Is there any clinical suspicion of gastric outlet obstruction? No definite mass is seen. 2. Bilateral renal calculi of no more than 4-5 mm in diameter. No present obstruction. 3. Degenerative disc disease at L5-S1. 4. Probable small cysts within the liver. Consider ultrasound if further assessment is warranted. Electronically Signed   By: Ivar Drape M.D.   On: 01/19/2018 10:15   ____________________________________________    PROCEDURES  Procedure(s) performed: None  Procedures  Critical Care performed: None  ____________________________________________   INITIAL IMPRESSION / ASSESSMENT AND PLAN / ED COURSE  Pertinent labs & imaging results that were available during my care of the patient were reviewed by me and considered in my medical decision making (see chart for details).  Patient here with chronic weight loss chronic epigastric abdominal pain.  I did discuss with Dr. Verl Blalock, he is very skeptical that NG tube will accomplish anything he thinks she needs a surgical consult.  I have therefore called surgery, they are currently evaluating the patient.  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  We are giving her pain medications and initiating preop work-up.  Disposition and further care will be per surgery.    ____________________________________________   FINAL CLINICAL IMPRESSION(S) / ED DIAGNOSES  Final diagnoses:  None      This chart was dictated using voice recognition software.  Despite best efforts to proofread,  errors can occur which can change meaning.      Schuyler Amor, MD 01/19/18 1501

## 2018-01-19 NOTE — Anesthesia Preprocedure Evaluation (Addendum)
Anesthesia Evaluation  Patient identified by MRN, date of birth, ID band Patient awake    Reviewed: Allergy & Precautions, NPO status , Patient's Chart, lab work & pertinent test results, reviewed documented beta blocker date and time   Airway Mallampati: II  TM Distance: >3 FB     Dental  (+) Chipped   Pulmonary           Cardiovascular      Neuro/Psych  Headaches, PSYCHIATRIC DISORDERS Anxiety Depression Bipolar Disorder    GI/Hepatic PUD, GERD  Controlled,  Endo/Other  Hypothyroidism   Renal/GU Renal disease     Musculoskeletal   Abdominal   Peds  Hematology   Anesthesia Other Findings Past Medical History: No date: Bleeding duodenal ulcer No date: Depression No date: GERD (gastroesophageal reflux disease) No date: Hypothyroidism No date: Kidney stones No date: Migraine headache     Comment:  only1-2x/month since starting topamax EKG ok.   Reproductive/Obstetrics                            Anesthesia Physical Anesthesia Plan  ASA: II  Anesthesia Plan: General   Post-op Pain Management:    Induction: Intravenous  PONV Risk Score and Plan:   Airway Management Planned: Oral ETT  Additional Equipment:   Intra-op Plan:   Post-operative Plan:   Informed Consent: I have reviewed the patients History and Physical, chart, labs and discussed the procedure including the risks, benefits and alternatives for the proposed anesthesia with the patient or authorized representative who has indicated his/her understanding and acceptance.     Plan Discussed with: CRNA  Anesthesia Plan Comments:         Anesthesia Quick Evaluation

## 2018-01-20 ENCOUNTER — Encounter
Admission: EM | Disposition: A | Discharge status: Home Health | Payer: Self-pay | Source: Home / Self Care | Attending: Surgery

## 2018-01-20 ENCOUNTER — Inpatient Hospital Stay: Payer: BLUE CROSS/BLUE SHIELD | Admitting: Anesthesiology

## 2018-01-20 ENCOUNTER — Encounter: Payer: Self-pay | Admitting: Anesthesiology

## 2018-01-20 DIAGNOSIS — K311 Adult hypertrophic pyloric stenosis: Secondary | ICD-10-CM

## 2018-01-20 DIAGNOSIS — E43 Unspecified severe protein-calorie malnutrition: Secondary | ICD-10-CM

## 2018-01-20 HISTORY — PX: LAPAROTOMY: SHX154

## 2018-01-20 LAB — CBC
HEMATOCRIT: 29.8 % — AB (ref 36.0–46.0)
Hemoglobin: 9.8 g/dL — ABNORMAL LOW (ref 12.0–15.0)
MCH: 29.7 pg (ref 26.0–34.0)
MCHC: 32.9 g/dL (ref 30.0–36.0)
MCV: 90.3 fL (ref 80.0–100.0)
NRBC: 0 % (ref 0.0–0.2)
Platelets: 148 10*3/uL — ABNORMAL LOW (ref 150–400)
RBC: 3.3 MIL/uL — ABNORMAL LOW (ref 3.87–5.11)
RDW: 12.2 % (ref 11.5–15.5)
WBC: 6.8 10*3/uL (ref 4.0–10.5)

## 2018-01-20 LAB — BASIC METABOLIC PANEL
ANION GAP: 4 — AB (ref 5–15)
BUN: 12 mg/dL (ref 6–20)
CO2: 28 mmol/L (ref 22–32)
Calcium: 8.2 mg/dL — ABNORMAL LOW (ref 8.9–10.3)
Chloride: 106 mmol/L (ref 98–111)
Creatinine, Ser: 0.68 mg/dL (ref 0.44–1.00)
GFR calc non Af Amer: >60 mL/min (ref >60)
GLUCOSE: 113 mg/dL — AB (ref 70–99)
POTASSIUM: 3.6 mmol/L (ref 3.5–5.1)
Sodium: 138 mmol/L (ref 135–145)

## 2018-01-20 LAB — CBC WITH DIFFERENTIAL/PLATELET
ABS IMMATURE GRANULOCYTES: 0.01 10*3/uL (ref 0.00–0.07)
BASOS ABS: 0 10*3/uL (ref 0.0–0.1)
Basophils Relative: 0 %
Eosinophils Absolute: 0 10*3/uL (ref 0.0–0.5)
Eosinophils Relative: 0 %
HEMATOCRIT: 34 % — AB (ref 36.0–46.0)
Hemoglobin: 11.1 g/dL — ABNORMAL LOW (ref 12.0–15.0)
IMMATURE GRANULOCYTES: 0 %
LYMPHS ABS: 1.4 10*3/uL (ref 0.7–4.0)
Lymphocytes Relative: 30 %
MCH: 29.5 pg (ref 26.0–34.0)
MCHC: 32.6 g/dL (ref 30.0–36.0)
MCV: 90.4 fL (ref 80.0–100.0)
MONOS PCT: 7 %
Monocytes Absolute: 0.3 10*3/uL (ref 0.1–1.0)
NEUTROS ABS: 3 10*3/uL (ref 1.7–7.7)
NEUTROS PCT: 63 %
NRBC: 0 % (ref 0.0–0.2)
PLATELETS: 193 10*3/uL (ref 150–400)
RBC: 3.76 MIL/uL — ABNORMAL LOW (ref 3.87–5.11)
RDW: 12.5 % (ref 11.5–15.5)
WBC: 4.8 10*3/uL (ref 4.0–10.5)

## 2018-01-20 LAB — COMPREHENSIVE METABOLIC PANEL
ALBUMIN: 2.6 g/dL — AB (ref 3.5–5.0)
ALT: 9 U/L (ref 0–44)
AST: 16 U/L (ref 15–41)
Alkaline Phosphatase: 34 U/L — ABNORMAL LOW (ref 38–126)
Anion gap: 6 (ref 5–15)
BUN: 6 mg/dL (ref 6–20)
CHLORIDE: 106 mmol/L (ref 98–111)
CO2: 23 mmol/L (ref 22–32)
CREATININE: 0.38 mg/dL — AB (ref 0.44–1.00)
Calcium: 7.3 mg/dL — ABNORMAL LOW (ref 8.9–10.3)
GFR calc Af Amer: >60 mL/min (ref >60)
GFR calc non Af Amer: >60 mL/min (ref >60)
GLUCOSE: 123 mg/dL — AB (ref 70–99)
POTASSIUM: 3.4 mmol/L — AB (ref 3.5–5.1)
SODIUM: 135 mmol/L (ref 135–145)
Total Bilirubin: 0.3 mg/dL (ref 0.3–1.2)
Total Protein: 4.4 g/dL — ABNORMAL LOW (ref 6.5–8.1)

## 2018-01-20 LAB — GLUCOSE, CAPILLARY: Glucose-Capillary: 127 mg/dL — ABNORMAL HIGH (ref 70–99)

## 2018-01-20 LAB — PHOSPHORUS
PHOSPHORUS: 4.9 mg/dL — AB (ref 2.5–4.6)
PHOSPHORUS: 2.4 mg/dL — AB (ref 2.5–4.6)

## 2018-01-20 LAB — MRSA PCR SCREENING: MRSA BY PCR: NEGATIVE

## 2018-01-20 LAB — TRIGLYCERIDES: TRIGLYCERIDES: 35 mg/dL (ref <150)

## 2018-01-20 LAB — MAGNESIUM
Magnesium: 1.8 mg/dL (ref 1.7–2.4)
Magnesium: 1.2 mg/dL — ABNORMAL LOW (ref 1.7–2.4)

## 2018-01-20 SURGERY — LAPAROTOMY, EXPLORATORY
Anesthesia: General

## 2018-01-20 MED ORDER — SODIUM CHLORIDE 0.9 % IV SOLN
INTRAVENOUS | Status: DC | PRN
  Administered 2018-01-20: 30 mL

## 2018-01-20 MED ORDER — FAT EMULSION PLANT BASED 20 % IV EMUL
250.0000 mL | INTRAVENOUS | Status: AC
  Administered 2018-01-20: 250 mL via INTRAVENOUS
  Filled 2018-01-20: qty 250

## 2018-01-20 MED ORDER — PHENYLEPHRINE HCL 10 MG/ML IJ SOLN
INTRAMUSCULAR | Status: DC | PRN
  Administered 2018-01-20 (×2): 100 ug via INTRAVENOUS

## 2018-01-20 MED ORDER — DEXAMETHASONE SODIUM PHOSPHATE 10 MG/ML IJ SOLN
INTRAMUSCULAR | Status: DC | PRN
  Administered 2018-01-20: 10 mg via INTRAVENOUS

## 2018-01-20 MED ORDER — ONDANSETRON HCL 4 MG/2ML IJ SOLN
4.0000 mg | Freq: Once | INTRAMUSCULAR | Status: AC | PRN
  Administered 2018-01-20: 4 mg via INTRAVENOUS

## 2018-01-20 MED ORDER — SODIUM CHLORIDE (PF) 0.9 % IJ SOLN
INTRAMUSCULAR | Status: AC
  Filled 2018-01-20: qty 10

## 2018-01-20 MED ORDER — INSULIN ASPART 100 UNIT/ML ~~LOC~~ SOLN
0.0000 [IU] | Freq: Four times a day (QID) | SUBCUTANEOUS | Status: DC
  Administered 2018-01-20: 1 [IU] via SUBCUTANEOUS
  Administered 2018-01-21: 2 [IU] via SUBCUTANEOUS
  Administered 2018-01-21 – 2018-01-23 (×6): 1 [IU] via SUBCUTANEOUS
  Administered 2018-01-23: 2 [IU] via SUBCUTANEOUS
  Administered 2018-01-27: 1 [IU] via SUBCUTANEOUS
  Filled 2018-01-20 (×13): qty 1

## 2018-01-20 MED ORDER — KETOROLAC TROMETHAMINE 30 MG/ML IJ SOLN
15.0000 mg | Freq: Four times a day (QID) | INTRAMUSCULAR | Status: DC
  Administered 2018-01-20 – 2018-01-23 (×11): 15 mg via INTRAVENOUS
  Filled 2018-01-20 (×11): qty 1

## 2018-01-20 MED ORDER — ONDANSETRON HCL 4 MG/2ML IJ SOLN
INTRAMUSCULAR | Status: AC
  Filled 2018-01-20: qty 2

## 2018-01-20 MED ORDER — LIDOCAINE HCL (CARDIAC) PF 100 MG/5ML IV SOSY
PREFILLED_SYRINGE | INTRAVENOUS | Status: DC | PRN
  Administered 2018-01-20: 80 mg via INTRAVENOUS

## 2018-01-20 MED ORDER — ROCURONIUM BROMIDE 50 MG/5ML IV SOLN
INTRAVENOUS | Status: AC
  Filled 2018-01-20: qty 1

## 2018-01-20 MED ORDER — MIDAZOLAM HCL 2 MG/2ML IJ SOLN
INTRAMUSCULAR | Status: DC | PRN
  Administered 2018-01-20: 2 mg via INTRAVENOUS

## 2018-01-20 MED ORDER — LACTATED RINGERS IV SOLN
INTRAVENOUS | Status: DC
  Administered 2018-01-20 (×2): via INTRAVENOUS

## 2018-01-20 MED ORDER — FENTANYL CITRATE (PF) 100 MCG/2ML IJ SOLN
INTRAMUSCULAR | Status: AC
  Filled 2018-01-20: qty 2

## 2018-01-20 MED ORDER — PROPOFOL 10 MG/ML IV BOLUS
INTRAVENOUS | Status: DC | PRN
  Administered 2018-01-20: 40 mg via INTRAVENOUS
  Administered 2018-01-20: 100 mg via INTRAVENOUS

## 2018-01-20 MED ORDER — BUPIVACAINE LIPOSOME 1.3 % IJ SUSP
INTRAMUSCULAR | Status: AC
  Filled 2018-01-20: qty 20

## 2018-01-20 MED ORDER — LIDOCAINE HCL (PF) 2 % IJ SOLN
INTRAMUSCULAR | Status: AC
  Filled 2018-01-20: qty 10

## 2018-01-20 MED ORDER — EVICEL 2 ML EX KIT
PACK | CUTANEOUS | Status: DC | PRN
  Administered 2018-01-20: 1

## 2018-01-20 MED ORDER — TRACE MINERALS CR-CU-MN-SE-ZN 10-1000-500-60 MCG/ML IV SOLN
INTRAVENOUS | Status: AC
  Administered 2018-01-20: 19:00:00 via INTRAVENOUS
  Filled 2018-01-20: qty 960

## 2018-01-20 MED ORDER — ONDANSETRON HCL 4 MG/2ML IJ SOLN
INTRAMUSCULAR | Status: DC | PRN
  Administered 2018-01-20: 4 mg via INTRAVENOUS

## 2018-01-20 MED ORDER — ROCURONIUM BROMIDE 100 MG/10ML IV SOLN
INTRAVENOUS | Status: DC | PRN
  Administered 2018-01-20: 50 mg via INTRAVENOUS
  Administered 2018-01-20: 20 mg via INTRAVENOUS

## 2018-01-20 MED ORDER — MIDAZOLAM HCL 2 MG/2ML IJ SOLN
INTRAMUSCULAR | Status: AC
  Filled 2018-01-20: qty 2

## 2018-01-20 MED ORDER — SUGAMMADEX SODIUM 200 MG/2ML IV SOLN
INTRAVENOUS | Status: DC | PRN
  Administered 2018-01-20: 200 mg via INTRAVENOUS

## 2018-01-20 MED ORDER — DEXAMETHASONE SODIUM PHOSPHATE 10 MG/ML IJ SOLN
INTRAMUSCULAR | Status: AC
  Filled 2018-01-20: qty 1

## 2018-01-20 MED ORDER — ACETAMINOPHEN 10 MG/ML IV SOLN
INTRAVENOUS | Status: DC | PRN
  Administered 2018-01-20: 1000 mg via INTRAVENOUS

## 2018-01-20 MED ORDER — SUCCINYLCHOLINE CHLORIDE 20 MG/ML IJ SOLN
INTRAMUSCULAR | Status: DC | PRN
  Administered 2018-01-20: 80 mg via INTRAVENOUS

## 2018-01-20 MED ORDER — SUGAMMADEX SODIUM 200 MG/2ML IV SOLN
INTRAVENOUS | Status: AC
  Filled 2018-01-20: qty 2

## 2018-01-20 MED ORDER — FENTANYL CITRATE (PF) 100 MCG/2ML IJ SOLN
INTRAMUSCULAR | Status: DC | PRN
  Administered 2018-01-20 (×2): 50 ug via INTRAVENOUS

## 2018-01-20 MED ORDER — SUMATRIPTAN SUCCINATE 6 MG/0.5ML ~~LOC~~ SOLN
6.0000 mg | Freq: Once | SUBCUTANEOUS | Status: AC
  Administered 2018-01-20: 6 mg via SUBCUTANEOUS
  Filled 2018-01-20: qty 0.5

## 2018-01-20 MED ORDER — FENTANYL CITRATE (PF) 100 MCG/2ML IJ SOLN
INTRAMUSCULAR | Status: AC
  Administered 2018-01-20: 25 ug via INTRAVENOUS
  Filled 2018-01-20: qty 2

## 2018-01-20 MED ORDER — PROPOFOL 10 MG/ML IV BOLUS
INTRAVENOUS | Status: AC
  Filled 2018-01-20: qty 20

## 2018-01-20 MED ORDER — LACTATED RINGERS IV SOLN
INTRAVENOUS | Status: AC
  Administered 2018-01-20 – 2018-01-23 (×7): via INTRAVENOUS

## 2018-01-20 MED ORDER — BUPIVACAINE-EPINEPHRINE 0.25% -1:200000 IJ SOLN
INTRAMUSCULAR | Status: DC | PRN
  Administered 2018-01-20: 30 mL

## 2018-01-20 MED ORDER — CEFAZOLIN SODIUM-DEXTROSE 2-3 GM-%(50ML) IV SOLR
INTRAVENOUS | Status: DC | PRN
  Administered 2018-01-20: 2 g via INTRAVENOUS

## 2018-01-20 MED ORDER — DIPHENHYDRAMINE HCL 50 MG/ML IJ SOLN
25.0000 mg | Freq: Once | INTRAMUSCULAR | Status: AC
  Administered 2018-01-20: 25 mg via INTRAVENOUS
  Filled 2018-01-20: qty 1

## 2018-01-20 MED ORDER — FENTANYL CITRATE (PF) 100 MCG/2ML IJ SOLN
25.0000 ug | INTRAMUSCULAR | Status: DC | PRN
  Administered 2018-01-20 (×4): 25 ug via INTRAVENOUS

## 2018-01-20 MED ORDER — ACETAMINOPHEN 10 MG/ML IV SOLN
INTRAVENOUS | Status: AC
  Filled 2018-01-20: qty 100

## 2018-01-20 MED ORDER — BUPIVACAINE-EPINEPHRINE (PF) 0.25% -1:200000 IJ SOLN
INTRAMUSCULAR | Status: AC
  Filled 2018-01-20: qty 30

## 2018-01-20 SURGICAL SUPPLY — 41 items
CANISTER SUCT 1200ML W/VALVE (MISCELLANEOUS) ×2 IMPLANT
CHLORAPREP W/TINT 10.5 ML (MISCELLANEOUS) ×2 IMPLANT
COVER WAND RF STERILE (DRAPES) IMPLANT
DRAPE LAPAROTOMY 100X77 ABD (DRAPES) ×2 IMPLANT
DRSG OPSITE POSTOP 4X12 (GAUZE/BANDAGES/DRESSINGS) IMPLANT
DRSG OPSITE POSTOP 4X8 (GAUZE/BANDAGES/DRESSINGS) ×2 IMPLANT
DRSG TEGADERM 4X10 (GAUZE/BANDAGES/DRESSINGS) ×2 IMPLANT
ELECT CAUTERY BLADE 6.4 (BLADE) ×2 IMPLANT
ELECT REM PT RETURN 9FT ADLT (ELECTROSURGICAL) ×2
ELECTRODE REM PT RTRN 9FT ADLT (ELECTROSURGICAL) ×1 IMPLANT
GAUZE SPONGE 4X4 12PLY STRL (GAUZE/BANDAGES/DRESSINGS) IMPLANT
GLOVE BIO SURGEON STRL SZ7.5 (GLOVE) ×4 IMPLANT
GLOVE BIOGEL PI IND STRL 6.5 (GLOVE) ×3 IMPLANT
GLOVE BIOGEL PI INDICATOR 6.5 (GLOVE) ×3
GLOVE SURG SYN 7.0 (GLOVE) ×4 IMPLANT
GLOVE SURG SYN 7.5  E (GLOVE) ×2
GLOVE SURG SYN 7.5 E (GLOVE) ×2 IMPLANT
GOWN STRL REUS W/ TWL LRG LVL3 (GOWN DISPOSABLE) ×4 IMPLANT
GOWN STRL REUS W/TWL LRG LVL3 (GOWN DISPOSABLE) ×4
HANDLE SUCTION POOLE (INSTRUMENTS) ×1 IMPLANT
LABEL OR SOLS (LABEL) ×2 IMPLANT
LIGASURE IMPACT 36 18CM CVD LR (INSTRUMENTS) ×2 IMPLANT
NEEDLE HYPO 22GX1.5 SAFETY (NEEDLE) ×2 IMPLANT
NS IRRIG 1000ML POUR BTL (IV SOLUTION) ×2 IMPLANT
PACK BASIN MAJOR ARMC (MISCELLANEOUS) ×2 IMPLANT
PACK COLON CLEAN CLOSURE (MISCELLANEOUS) ×2 IMPLANT
RELOAD PROXIMATE 75MM BLUE (ENDOMECHANICALS) ×8 IMPLANT
SEPRAFILM MEMBRANE 5X6 (MISCELLANEOUS) ×2 IMPLANT
STAPLER GUN LINEAR PROX 60 (STAPLE) ×2 IMPLANT
STAPLER PROXIMATE 75MM BLUE (STAPLE) ×2 IMPLANT
STAPLER SKIN PROX 35W (STAPLE) ×2 IMPLANT
SUCTION POOLE HANDLE (INSTRUMENTS) ×2
SUT PDS AB 1 TP1 96 (SUTURE) ×4 IMPLANT
SUT PROLENE 0 CT 1 30 (SUTURE) IMPLANT
SUT SILK 2 0 (SUTURE) ×1
SUT SILK 2-0 18XBRD TIE 12 (SUTURE) ×1 IMPLANT
SUT SILK 3-0 (SUTURE) IMPLANT
SUT VIC AB 3-0 SH 27 (SUTURE) ×1
SUT VIC AB 3-0 SH 27X BRD (SUTURE) ×1 IMPLANT
SYR 10ML LL (SYRINGE) ×4 IMPLANT
TRAY FOLEY MTR SLVR 16FR STAT (SET/KITS/TRAYS/PACK) ×2 IMPLANT

## 2018-01-20 NOTE — Anesthesia Post-op Follow-up Note (Signed)
Anesthesia QCDR form completed.        

## 2018-01-20 NOTE — Anesthesia Procedure Notes (Signed)
Procedure Name: Intubation Date/Time: 01/20/2018 1:30 PM Performed by: Doreen Salvage, CRNA Pre-anesthesia Checklist: Patient identified, Emergency Drugs available, Suction available and Patient being monitored Patient Re-evaluated:Patient Re-evaluated prior to induction Oxygen Delivery Method: Circle system utilized Preoxygenation: Pre-oxygenation with 100% oxygen Induction Type: IV induction, Cricoid Pressure applied and Rapid sequence Ventilation: Mask ventilation without difficulty Laryngoscope Size: Mac, 3 and McGraph Grade View: Grade I Tube type: Oral Tube size: 7.0 mm Number of attempts: 1 Airway Equipment and Method: Stylet Placement Confirmation: ETT inserted through vocal cords under direct vision,  positive ETCO2 and breath sounds checked- equal and bilateral Secured at: 21 cm Tube secured with: Tape Dental Injury: Teeth and Oropharynx as per pre-operative assessment  Comments: NGT to suction prior to induction

## 2018-01-20 NOTE — Op Note (Signed)
  Procedure Date:  01/20/2018  Pre-operative Diagnosis:   Gastric outlet obstruction  Post-operative Diagnosis:  Gastric outlet obstruction  Procedure:  Exploratory Laparotomy, gastrojejunostomy, jejunojejunostomy  Surgeon:  Melvyn Neth, MD  Assistant:  Nestor Lewandowsky, MD.  His assistance was needed due to the complexity of the case, with creation of two anastomoses and potential for significant scarring due to a prior open cholecystectomy.  He assisted in the creation of both anastomosis and in closure of the abdomen.  Anesthesia:  General endotracheal  Estimated Blood Loss:  30 ml  Specimens:  gastrotomy  Complications:  None  Indications for Procedure:  This is a 52 y.o. female who presents with abdominal pain and gastric outlet obstruction, due to significant duodenal bulb stenosis.  The risks of bleeding, abscess or infection, injury to surrounding structures, and need for further procedures were all discussed with the patient and was willing to proceed.  Description of Procedure: The patient was correctly identified in the preoperative area and brought into the operating room.  The patient was placed supine with VTE prophylaxis in place.  Appropriate time-outs were performed.  Anesthesia was induced and the patient was intubated.  Foley catheter was placed.  Appropriate antibiotics were infused.  The abdomen was prepped and draped in a sterile fashion.  An upper midline incision was made and electrocautery was used to dissect down the subcutaneous tissue to the fascia.  The fascia was incised and extended superiorly and inferiorly.  The abdomen was explored revealing a distended stomach.  No palpable masses were found.  An anterior gastrotomy was created to evacuate the stomach contents.  This was closed with a blue load GIA stapler.  Then the greater curvature was mobilized using LigaSure over the omentum, preserving the gastroepiploic bundle.  The transverse colon was then  retracted cephalad and a window in the mesentery was created.  A loop of jejunum approximately 20-30 cm distal to the ligament of Treitz was passed through the mesenteric window without any tension.  The posterior stomach wall and the antimesenteric border of this loop were secured together using two 3-0 silk sutures.  Cautery was used to create a gastrotomy and enterotomy for the GIA stapler.  The anastomosis was created and the common channel was closed using the GIA stapler as well.  The anastomosis was widely patent.  Any bleeding from the staple line was controlled with gentle cautery.  Then a jejuno-jejunostomy was created between jejunum distal to the gastrojejunostomy and jejunum proximal to it, creating an omega loop.  The anastomosis was created in side to side fashion using the GIA stapler and the common channel closed with a TA stapler.  The anastomosis was widely patent as well.  Any bleeding from the staple line was controlled with cautery as well.  Then the mesenteric defect was closed incorporating a seromuscular bite to the jejunum to prevent any internal hernia.  There was mild oozing from the staple lines and this was then covered with fibrin glue.  There was good hemostasis at this point.  The abdominal cavity was then thoroughly irrigated. The fascia was closed using #1 PDS sutures.  The midline wound was irrigated and closed using staples.  The wound was dressed with Honeycomb dressing.  The patient was emerged from anesthesia and extubated and brought to the recovery room for further management.  The patient tolerated the procedure well and all counts were correct at the end of the case.   Melvyn Neth, MD

## 2018-01-20 NOTE — Transfer of Care (Signed)
Immediate Anesthesia Transfer of Care Note  Patient: Tanya Nguyen  Procedure(s) Performed: Procedure(s): EXPLORATORY LAPAROTOMY (N/A)  Patient Location: PACU  Anesthesia Type:General  Level of Consciousness: sedated  Airway & Oxygen Therapy: Patient Spontanous Breathing and Patient connected to face mask oxygen  Post-op Assessment: Report given to RN and Post -op Vital signs reviewed and stable  Post vital signs: Reviewed and stable  Last Vitals:  Vitals:   01/20/18 1221 01/20/18 1619  BP: 116/72 (!) 148/89  Pulse: 67 (!) 103  Resp: 18 15  Temp: 36.8 C (!) (P) 36.2 C  SpO2: 54% 270%    Complications: No apparent anesthesia complications

## 2018-01-20 NOTE — Consult Note (Signed)
PHARMACY - ADULT TOTAL PARENTERAL NUTRITION CONSULT NOTE   Pharmacy Consult for TPN management Indication: SBO  Patient Measurements: Height: 4\' 7"  (139.7 cm) Weight: 102 lb (46.3 kg) IBW/kg (Calculated) : 34 TPN AdjBW (KG): 37.1 Body mass index is 23.71 kg/m.  Assessment: 52 year old female who presented to the ED on 11/14 with chronic abdominal pain. She had a PICC line placed yesterday, 11/14 with TPN to be started today after Ex-lap and gastrojejunostomy scheduled for this afternoon with Dr. Hampton Abbot. She has an NGT to low intermittent suction. No output recorded in I&O's at this time. She reports her UBW as 140 lbs and states that she last weighed this in June 2019 with a 40 lb weight loss over the course of 5 months and states that her most recent weight at her PCP was 102 lbs. This is a 20.5 lb weight loss since 07/28/17. (16.7% weight loss in less than 6 months).  GI: PPI Endo: SSI, on Synthroid 16mcg daily (last TSH 10/14 wnl) Insulin requirements in the past 24 hours: none Lytes: K 3.6, magnesium 1.8, phosporus 4.9 Renal: SCr<1 Pulm: Cards:  Hepatobil: Neuro: ID: WBC wnl, afebrile MRSA PCR (-) No current antibiotics TPN Access: 01/19/18 TPN start date: 01/20/18 Nutritional Goals (per RD recommendation on 01/20/18): KCal: 1022 Protein: 72g Fluid: 1411ml  Goal TPN rate is 60 ml/hr (provides 90% of patient needs)  Current Nutrition:   Plan:  E 5/15 TPN at 64mL/hr + 20% lipids at 72ml/hr for 12 hours This TPN provides 48 g of protein, 144 g of dextrose, and 36 g of lipids which provides 681 kCals per day, meeting 60% of patient needs Electrolytes in TPN: E 5/15, no additional Add MVI, trace elements and IV thiamine 100mg  x 3days to TPN sensitive q6h SSI and adjust as needed LR IVMF at 85 ml/hr Monitor TPN labs, Mg, P and K daily for first 3 days F/U in am: if phosphorous and potassium are wnl will plan to advance rate of TPN  Dallie Piles,  PharmD 01/20/2018,10:50 AM

## 2018-01-20 NOTE — Progress Notes (Signed)
Initial Nutrition Assessment  DOCUMENTATION CODES:   Severe malnutrition in context of chronic illness  INTERVENTION:    - Initiate Clinimix 5/15 with electrolytes at 40 ml/hr (Goal rate 60 ml/hr once labs stable)   Regimen @ 60 ml/hr will provide 1022 kcal/day, 72 g/day protein, 1440 ml volume   - Add MVI daily   - Add trace elements daily   - Add IV thiamine 100 mg daily x 3 days   - Pt at high refeeding risk. Recommend check P, K, and Mg daily for 3 days after TPN iniatition.   - Daily weights   - Recommend adjust IVF rate per MD  NUTRITION DIAGNOSIS:   Severe Malnutrition related to chronic illness (gastric outlet obstruction, chronic abdominal pain) as evidenced by moderate fat depletion, severe muscle depletion, energy intake < or equal to 75% for > or equal to 1 month, percent weight loss (16.7% weight loss in less than 6 months).  GOAL:   Patient will meet greater than or equal to 90% of their needs  MONITOR:   I & O's, Skin, Weight trends, Labs, Diet advancement, Other (Comment)(TPN)  REASON FOR ASSESSMENT:   Consult New TPN/TNA  ASSESSMENT:   52 year old female who presented to the ED on 11/14 with chronic abdominal pain. PMH significant for gastric outlet obstruction and recent EGD showing significant food bezoar in the stomach with marked distension and pyloric stenosis, pyloric edema, and ulceration. PMH also significant for bipolar 1 disorder, depression, GERD, hypothyroidism, and a history of bleeding duodenal ulcers.  Pt had PICC line placed yesterday, 11/14. Plan is for TPN to be started today after surgery. Ex-lap and gastrojejunostomy scheduled for this afternoon with Dr. Hampton Abbot.  Pt with NGT to low intermittent suction. No output recorded in I&O's at this time.  Spoke with pt and husband at bedside. Pt reports intense abdominal pain and weakness at time of visit.  Pt states that she has had abdominal pain "for a long time" but that her symptoms  worsened 5 months ago. At this time, pt began losing weight. Pt reports her UBW as 140 lbs and states that she last weighed this in June 2019. Pt endorses a 40 lb weight loss over the course of 5 months and states that her most recent weight at her PCP was 102 lbs.  Per weight history in chart, pt with 20.5 lb weight loss since 07/28/17. This is a 16.7% weight loss in less than 6 months which is severe and significant for timeframe.  Pt states that she has had no appetite recently but has been "making myself eat because I don't want to get weak." Pt reports typically eating twice daily. In the morning, pt will have an egg sandwich "because I can usually keep it down." Pt normally does not eat again until the evening when she may have baked chicken and vegetables. Pt states that she does not snack throughout the day and drinks water and decaf coffee.  Pt shares that she experiences emesis 1-3 times weekly. Pt states that she does not vomit after every meal but does experience worsened pain and nausea after every time that she eats.  Pt shares that she is active as she has to take care of her mother. Pt also reports that she had to stop working 5 months ago due to pain.  Discussed TPN with pt and husband who expressed understanding.  Medications reviewed and include: levothyroxine, Protonix, lactated ringers at 125 ml/hr  Labs reviewed: phosphorus 4.9 (H)  UOP: 900 ml since admission  NUTRITION - FOCUSED PHYSICAL EXAM:    Most Recent Value  Orbital Region  Moderate depletion  Upper Arm Region  Moderate depletion  Thoracic and Lumbar Region  Moderate depletion  Buccal Region  Moderate depletion  Temple Region  Mild depletion  Clavicle Bone Region  Moderate depletion  Clavicle and Acromion Bone Region  Moderate depletion  Scapular Bone Region  Unable to assess  Dorsal Hand  Moderate depletion  Patellar Region  Severe depletion  Anterior Thigh Region  Severe depletion  Posterior Calf Region   Moderate depletion  Edema (RD Assessment)  None  Hair  Reviewed  Eyes  Reviewed  Mouth  Reviewed  Skin  Reviewed  Nails  Reviewed       Diet Order:   Diet Order            Diet NPO time specified  Diet effective now              EDUCATION NEEDS:   Education needs have been addressed  Skin:  Skin Assessment: Reviewed RN Assessment (closed incisions to rectum and throat)  Last BM:  11/6 (per pt)  Height:   Ht Readings from Last 1 Encounters:  01/19/18 4\' 7"  (1.397 m)    Weight:   Wt Readings from Last 1 Encounters:  01/19/18 46.3 kg    Ideal Body Weight:  34.09 kg  BMI:  Body mass index is 23.71 kg/m.  Estimated Nutritional Needs:   Kcal:  1400-1600 (30-35 kcal/kg)  Protein:  65-80 grams (1.4-1.7 g/kg)  Fluid:  1.4-1.6 L (1 ml/kcal)    Gaynell Face, MS, RD, LDN Inpatient Clinical Dietitian Pager: [phone] Weekend/After Hours: [phone]

## 2018-01-20 NOTE — Plan of Care (Signed)
Pt. Is tolerating ngt to low wall suction.  Scheduled for surgery this afternoon.  Will continue to monitor.

## 2018-01-20 NOTE — Progress Notes (Signed)
[facility] Progress Note  Day of Surgery  Subjective: No acute events overnight. She had her PICC line placed yesterday. She notes that her abdominal pain has improved some. No complaints of nausea or emesis. Has been NPO.   Objective: Vital signs in last 24 hours: Temp:  [97.7 F (36.5 C)-98.2 F (36.8 C)] 98.2 F (36.8 C) (11/15 0838) Pulse Rate:  [65-94] 70 (11/15 0838) Resp:  [14-20] 20 (11/15 0557) BP: (98-138)/(58-95) 99/67 (11/15 0838) SpO2:  [96 %-100 %] 98 % (11/15 0838) Weight:  [46.3 kg] 46.3 kg (11/14 1400) Last BM Date: 01/11/18  Intake/Output from previous day: 11/14 0701 - 11/15 0700 In: 849.9 [I.V.:349.9; IV Piggyback:500] Out: 500 [Urine:500] Intake/Output this shift: Total I/O In: -  Out: 400 [Urine:400]  PE: Gen:  Alert, NAD, pleasant HEENT: NGT in place Pulm:  Normal effort Abd: Soft, epigastric tenderness which is improved, non-distended Skin: warm and dry, no rashes  Psych: A&Ox3   Lab Results:  Recent Labs    01/19/18 1424 01/20/18 0440  WBC 4.4 4.8  HGB 13.5 11.1*  HCT 39.1 34.0*  PLT 239 193   BMET Recent Labs    01/19/18 1424 01/20/18 0440  NA 141 138  K 3.1* 3.6  CL 103 106  CO2 28 28  GLUCOSE 106* 113*  BUN 14 12  CREATININE 0.78 0.68  CALCIUM 9.0 8.2*   PT/INR Recent Labs    01/19/18 1424  LABPROT 13.7  INR 1.06   CMP     Component Value Date/Time   NA 138 01/20/2018 0440   NA 140 04/08/2014 1913   K 3.6 01/20/2018 0440   K 3.8 04/08/2014 1913   CL 106 01/20/2018 0440   CL 103 04/08/2014 1913   CO2 28 01/20/2018 0440   CO2 33 (H) 04/08/2014 1913   GLUCOSE 113 (H) 01/20/2018 0440   GLUCOSE 92 04/08/2014 1913   BUN 12 01/20/2018 0440   BUN 19 (H) 04/08/2014 1913   CREATININE 0.68 01/20/2018 0440   CREATININE 0.98 04/08/2014 1913   CALCIUM 8.2 (L) 01/20/2018 0440   CALCIUM 9.2 04/08/2014 1913   PROT 6.8 01/19/2018 1424   PROT 7.1 04/08/2014 1913   ALBUMIN 4.1 01/19/2018 1424   ALBUMIN  4.0 04/08/2014 1913   AST 18 01/19/2018 1424   AST 28 04/08/2014 1913   ALT 12 01/19/2018 1424   ALT 16 04/08/2014 1913   ALKPHOS 52 01/19/2018 1424   ALKPHOS 69 04/08/2014 1913   BILITOT 0.4 01/19/2018 1424   BILITOT 0.2 04/08/2014 1913   GFRNONAA >60 01/20/2018 0440   GFRNONAA >60 04/08/2014 1913   GFRAA >60 01/20/2018 0440   GFRAA >60 04/08/2014 1913   Lipase     Component Value Date/Time   LIPASE 40 01/19/2018 1424   LIPASE 167 04/08/2014 1913       Studies/Results: Dg Abd 1 View  Result Date: 01/19/2018 CLINICAL DATA:  Nasogastric tube placement EXAM: ABDOMEN - 1 VIEW COMPARISON:  CT abdomen 01/19/2018 FINDINGS: Distended stomach. The nasogastric tube tip is in the stomach body. Small amount of contrast medium is in the right colon. IMPRESSION: 1. The nasogastric tube tip and side port are in the stomach body. The stomach appears distended. Electronically Signed   By: Van Clines M.D.   On: 01/19/2018 20:45   Ct Abdomen Pelvis W Contrast  Result Date: 01/19/2018 CLINICAL DATA:  Unintended weight loss of 50 pounds in 6 months, constipation, some nausea and vomiting EXAM: CT ABDOMEN AND  PELVIS WITH CONTRAST TECHNIQUE: Multidetector CT imaging of the abdomen and pelvis was performed using the standard protocol following bolus administration of intravenous contrast. CONTRAST:  12mL OMNIPAQUE IOHEXOL 300 MG/ML  SOLN COMPARISON:  KUB of 04/08/2014 FINDINGS: Lower chest: The lung bases are clear. The heart is within normal limits in size. No pericardial effusion is seen. Hepatobiliary: The liver enhances and there are 2 low-attenuation structures 1 medially in the right lobe and 1 anteriorly in the left lobe both of which are most typical of a benign process but cannot be well assessed on this CT. On delayed images, both of these lesions remain low attenuation probably representing small hepatic cysts of doubtful clinical significance. If further assessment is warranted,  ultrasound would be helpful. No ductal dilatation is seen. The gallbladder has previously been resected with surgical clips noted. Pancreas: The pancreas is normal in size and the pancreatic duct is not dilated. Spleen: The spleen is unremarkable. Adrenals/Urinary Tract: The adrenal glands appear normal. The kidneys enhance and there do appear to be small bilateral renal calculi more numerous in the lower pole of the right kidney without obstruction. None of these calculi is larger than 4-5 mm in diameter. No hydronephrosis is seen. The ureters appear normal in caliber. The urinary bladder is not well distended but no abnormality is seen. Stomach/Bowel: The stomach is massively dilated with contrast and considerable food debris. Is there any suspicion of gastric outlet obstruction? No definite mass is seen. The small bowel is not dilated. There is a moderate amount of feces throughout the entire colon. The terminal ileum is difficult to adequately visualized but no edema or mass is seen. The appendix may extend posteriorly, but it is not optimally visualized. No inflammatory process is noted however within the right lower quadrant. Vascular/Lymphatic: The abdominal aorta is normal in caliber. No adenopathy is seen. Reproductive: The uterus has previously been resected. No adnexal lesion is seen. There is a small amount of fluid within the pelvis. Other: No abdominal wall hernia is noted. Musculoskeletal: The lumbar vertebrae are in normal alignment. There is degenerative disc disease at L5-S1 where there is some loss of disc space, sclerosis, spurring, and vacuum disc phenomenon present. The SI joints appear corticated. IMPRESSION: 1. Massive distention of the stomach with considerable retained fluid and food debris. Is there any clinical suspicion of gastric outlet obstruction? No definite mass is seen. 2. Bilateral renal calculi of no more than 4-5 mm in diameter. No present obstruction. 3. Degenerative disc  disease at L5-S1. 4. Probable small cysts within the liver. Consider ultrasound if further assessment is warranted. Electronically Signed   By: Ivar Drape M.D.   On: 01/19/2018 10:15   [place] Ekg Site Rite  Result Date: 01/19/2018 If Site Rite image not attached, placement could not be confirmed due to current cardiac rhythm.   Anti-infectives: Anti-infectives (From admission, onward)   None       Assessment/Plan  Gastric Outlet Obstruction  Tanya Nguyen is a 52 y.o. female with gastric outlet obstruction with significant gastric distension, likely bezoar, and pyloric edema, stenosis, and ulceration with mild hypokalemia and nausea/emesis which is complicated by pertinent co-morbidities including bipolar 1 disorder, depression, GERD, hypothyroidism, and a history bleeding duodenal ulcers.   - Plan for exploratory laparotomy and gastojejunostomy this afternoon with Dr. Hampton Abbot. All risks, benefits, and alternatives to above procedure(s) were discussed with the patient and all of her questions were answered to her expressed satisfaction, patient expresses she wishes to proceed,  and informed consent was obtained.   - NPO, IVF             - NGT Decompression             - Pain control, Anti-emetics  - PICC placed last night, will plan on starting TPN post-operatively today given significant malnutrition history  - Mobilize   - DVT Prophylaxis   -- Edison Simon , PA-C [facility] 01/20/2018, 8:44 AM [phone] M-F: 7am - 4pm

## 2018-01-20 NOTE — Plan of Care (Signed)
The patient has been taken down for surgery. IV fluids running. TPN to be started later today. Medication provided for migraine.  Problem: Education: Goal: Knowledge of General Education information will improve Description Including pain rating scale, medication(s)/side effects and non-pharmacologic comfort measures Outcome: Progressing   Problem: Clinical Measurements: Goal: Ability to maintain clinical measurements within normal limits will improve Outcome: Progressing Goal: Will remain free from infection Outcome: Progressing Goal: Diagnostic test results will improve Outcome: Progressing Goal: Respiratory complications will improve Outcome: Progressing Goal: Cardiovascular complication will be avoided Outcome: Progressing   Problem: Activity: Goal: Risk for activity intolerance will decrease Outcome: Progressing   Problem: Nutrition: Goal: Adequate nutrition will be maintained Outcome: Progressing   Problem: Coping: Goal: Level of anxiety will decrease Outcome: Progressing   Problem: Elimination: Goal: Will not experience complications related to bowel motility Outcome: Progressing Goal: Will not experience complications related to urinary retention Outcome: Progressing   Problem: Pain Managment: Goal: General experience of comfort will improve Outcome: Progressing   Problem: Safety: Goal: Ability to remain free from injury will improve Outcome: Progressing   Problem: Skin Integrity: Goal: Risk for impaired skin integrity will decrease Outcome: Progressing   Problem: Clinical Measurements: Goal: Ability to maintain clinical measurements within normal limits will improve Outcome: Progressing Goal: Postoperative complications will be avoided or minimized Outcome: Progressing   Problem: Skin Integrity: Goal: Demonstration of wound healing without infection will improve Outcome: Progressing

## 2018-01-21 LAB — GLUCOSE, CAPILLARY
GLUCOSE-CAPILLARY: 124 mg/dL — AB (ref 70–99)
GLUCOSE-CAPILLARY: 160 mg/dL — AB (ref 70–99)
Glucose-Capillary: 111 mg/dL — ABNORMAL HIGH (ref 70–99)
Glucose-Capillary: 104 mg/dL — ABNORMAL HIGH (ref 70–99)

## 2018-01-21 LAB — MAGNESIUM: MAGNESIUM: 1.7 mg/dL (ref 1.7–2.4)

## 2018-01-21 LAB — POTASSIUM: Potassium: 3.7 mmol/L (ref 3.5–5.1)

## 2018-01-21 LAB — HIV ANTIBODY (ROUTINE TESTING W REFLEX): HIV Screen 4th Generation wRfx: NONREACTIVE

## 2018-01-21 LAB — PREALBUMIN: PREALBUMIN: 14.2 mg/dL — AB (ref 18–38)

## 2018-01-21 LAB — PHOSPHORUS: PHOSPHORUS: 2.8 mg/dL (ref 2.5–4.6)

## 2018-01-21 MED ORDER — SUMATRIPTAN SUCCINATE 6 MG/0.5ML ~~LOC~~ SOLN
6.0000 mg | Freq: Once | SUBCUTANEOUS | Status: AC
  Administered 2018-01-21: 6 mg via SUBCUTANEOUS
  Filled 2018-01-21: qty 0.5

## 2018-01-21 MED ORDER — FAT EMULSION PLANT BASED 20 % IV EMUL
250.0000 mL | INTRAVENOUS | Status: AC
  Administered 2018-01-21: 250 mL via INTRAVENOUS
  Filled 2018-01-21: qty 250

## 2018-01-21 MED ORDER — ACETAMINOPHEN 10 MG/ML IV SOLN
1000.0000 mg | Freq: Four times a day (QID) | INTRAVENOUS | Status: AC
  Administered 2018-01-21 – 2018-01-22 (×4): 1000 mg via INTRAVENOUS
  Filled 2018-01-21 (×4): qty 100

## 2018-01-21 MED ORDER — TRACE MINERALS CR-CU-MN-SE-ZN 10-1000-500-60 MCG/ML IV SOLN
INTRAVENOUS | Status: AC
  Administered 2018-01-21: 18:00:00 via INTRAVENOUS
  Filled 2018-01-21: qty 1440

## 2018-01-21 MED ORDER — HYDROMORPHONE HCL 1 MG/ML IJ SOLN
0.5000 mg | INTRAMUSCULAR | Status: DC | PRN
  Administered 2018-01-21 – 2018-01-23 (×10): 1 mg via INTRAVENOUS
  Filled 2018-01-21 (×10): qty 1

## 2018-01-21 NOTE — Progress Notes (Addendum)
IV fluids have been increased to 152ml/hr, Per MD for low urine output.

## 2018-01-21 NOTE — Progress Notes (Signed)
NG tube output has been 760ml for my shift. TPN running at 74ml/hr and LR fluid is running at 50ml/hr.

## 2018-01-21 NOTE — Progress Notes (Signed)
[facility] Hospital Day(s): 2.   Post op day(s): 1 Day Post-Op.   Interval History: Patient seen and examined, no acute events overnight, though complains of peri-incisional pain, moderately controlled. Patient otherwise denies flatus, BM, N/V, fever/chills, CP, or SOB. She also has not yet ambulated.  Review of Systems:  Constitutional: denies fever, chills  Respiratory: denies any shortness of breath  Cardiovascular: denies chest pain or palpitations  Gastrointestinal: abdominal pain, N/V, and bowel function as per interval history Musculoskeletal: denies pain, decreased motor or sensation Integumentary: denies any other rashes or skin discolorations  Vital signs in last 24 hours: [min-max] current  Temp:  [97.1 F (36.2 C)-99.1 F (37.3 C)] 98.1 F (36.7 C) (11/16 0432) Pulse Rate:  [67-103] 89 (11/16 0432) Resp:  [10-18] 16 (11/16 0432) BP: (103-148)/(50-89) 114/65 (11/16 0432) SpO2:  [92 %-100 %] 97 % (11/16 0432) Weight:  [46 kg] 46 kg (11/16 0432)     Height: 4\' 7"  (139.7 cm) Weight: 46 kg BMI (Calculated): 23.57   Intake/Output this shift:  Total I/O In: 10 [I.V.:10] Out: -    Intake/Output last 2 shifts:  @IOLAST2SHIFTS @   Physical Exam:  Constitutional: alert, cooperative and no distress  Respiratory: breathing non-labored at rest  Cardiovascular: regular rate and sinus rhythm  Gastrointestinal: soft and non-distended with appropriate mild-/moderate- peri-incisional tenderness to palpation, no guarding or rebound tenderness, incision well-approximated without any peri-incisional erythema or drainage, bilious fluid from well-secured NG tube Extremities: FROM, no edema  Labs:  CBC Latest Ref Rng & Units 01/20/2018 01/20/2018 01/19/2018  WBC 4.0 - 10.5 K/uL 6.8 4.8 4.4  Hemoglobin 12.0 - 15.0 g/dL 9.8(L) 11.1(L) 13.5  Hematocrit 36.0 - 46.0 % 29.8(L) 34.0(L) 39.1  Platelets 150 - 400 K/uL 148(L) 193 239   CMP Latest Ref Rng & Units 01/21/2018  01/20/2018 01/20/2018  Glucose 70 - 99 mg/dL - 123(H) 113(H)  BUN 6 - 20 mg/dL - 6 12  Creatinine 0.44 - 1.00 mg/dL - 0.38(L) 0.68  Sodium 135 - 145 mmol/L - 135 138  Potassium 3.5 - 5.1 mmol/L 3.7 3.4(L) 3.6  Chloride 98 - 111 mmol/L - 106 106  CO2 22 - 32 mmol/L - 23 28  Calcium 8.9 - 10.3 mg/dL - 7.3(L) 8.2(L)  Total Protein 6.5 - 8.1 g/dL - 4.4(L) -  Total Bilirubin 0.3 - 1.2 mg/dL - 0.3 -  Alkaline Phos 38 - 126 U/L - 34(L) -  AST 15 - 41 U/L - 16 -  ALT 0 - 44 U/L - 9 -   Imaging studies: No new pertinent imaging studies  Assessment/Plan: 52 y.o. female with post-surgical pain 1 Day Post-Op s/p open gastro-jejunal bypass for chronic NSAIDs-associated duodenal obstruction, complicated by pertinent comorbidities including GERD with history of bleeding duodenal ulcer, hypothyroidism, migraine headaches, nephrolithiasis, and major depression disorder.   - discontinue Foley catheter  - continue NG tube for now with TPN ongoing  - anticipate NG tube over the weekend, possibly out Monday  - will add IV acetaminophen to pain medications and adjust dilaudid dose  - medical management of comorbidities, including PPI  - DVT prophylaxis, ambulation encouraged  All of the above findings and recommendations were discussed with the patient and patient's RN, and all of patient's questions were answered to her expressed satisfaction.  -- Marilynne Drivers Rosana Hoes, MD, [place]: [facility] - Partnering for exceptional care. Office: [phone]

## 2018-01-21 NOTE — Consult Note (Signed)
PHARMACY - ADULT TOTAL PARENTERAL NUTRITION CONSULT NOTE   Pharmacy Consult for TPN management Indication: SBO  Patient Measurements: Height: 4\' 7"  (139.7 cm) Weight: 101 lb 6.6 oz (46 kg) IBW/kg (Calculated) : 34 TPN AdjBW (KG): 37.1 Body mass index is 23.57 kg/m.  Assessment: 52 year old female who presented to the ED on 11/14 with chronic abdominal pain. She had a PICC line placed yesterday, 11/14 with TPN to be started today after Ex-lap and gastrojejunostomy scheduled for this afternoon with Dr. Hampton Abbot. She has an NGT to low intermittent suction. No output recorded in I&O's at this time. She reports her UBW as 140 lbs and states that she last weighed this in June 2019 with a 40 lb weight loss over the course of 5 months and states that her most recent weight at her PCP was 102 lbs. This is a 20.5 lb weight loss since 07/28/17. (16.7% weight loss in less than 6 months).  GI: PPI Endo: SSI, on Synthroid 48mcg daily (last TSH 10/14 wnl) Insulin requirements in the past 24 hours: none Lytes: K 3.6, magnesium 1.8, phosporus 4.9 Renal: SCr<1 Pulm: Cards:  Hepatobil: Neuro: ID: WBC wnl, afebrile MRSA PCR (-) No current antibiotics TPN Access: 01/19/18 TPN start date: 01/20/18 Nutritional Goals (per RD recommendation on 01/20/18): KCal: 1022 Protein: 72g Fluid: 1446ml  Goal TPN rate is 60 ml/hr (provides 90% of patient needs)  Current Nutrition:   Plan:  E 5/15 TPN at 2mL/hr + 20% lipids at 71ml/hr for 12 hours  This TPN provides 48 g of protein, 144 g of dextrose, and 36 g of lipids which provides 681 kCals per day, meeting 60% of patient needs Electrolytes in TPN: E 5/15, no additional Add MVI, trace elements and IV thiamine 100mg  x 3days to TPN sensitive q6h SSI and adjust as needed LR IVMF at 50 ml/hr Monitor TPN labs, Mg, P and K daily for first 3 days.   11/16:  Phosphorous and potassium are WNL. Advance rate of TPN to goal of 60 ml/hr.   Gurfateh Mcclain K,  RPH 01/21/2018,8:49 AM

## 2018-01-21 NOTE — Progress Notes (Addendum)
[place] Blood pressures hav been lower today then the last few days.  BP this AM was 93/97, rechecked BP 95/49, 94/57, Heart rate in the 80's. Urine output since the foley catheter was removed has only been 300cc. Do you want to increase IV fluid rate or monitor for now. Dr. Rosana Hoes has been notified and has increased IV fluids.

## 2018-01-21 NOTE — Progress Notes (Addendum)
Foley catheter has been removed per order.

## 2018-01-21 NOTE — Progress Notes (Signed)
[place] can she get Imitrex for migrane she had a dose yesterday and she takes [employer] daily. Cory Roughen [phone]

## 2018-01-21 NOTE — Plan of Care (Signed)
Pain has continued to be an issue for the patient since surgery. Dr. Rosana Hoes is aware of the pain and has increased PRN pain medication if needed. Imitrex given for migraine. Foley catheter removed. TOV due at round 1830. Monitoring NG tube output. TPN continued.   Problem: Education: Goal: Knowledge of General Education information will improve Description Including pain rating scale, medication(s)/side effects and non-pharmacologic comfort measures Outcome: Progressing   Problem: Clinical Measurements: Goal: Ability to maintain clinical measurements within normal limits will improve Outcome: Progressing Goal: Will remain free from infection Outcome: Progressing Goal: Diagnostic test results will improve Outcome: Progressing Goal: Respiratory complications will improve Outcome: Progressing Goal: Cardiovascular complication will be avoided Outcome: Progressing   Problem: Activity: Goal: Risk for activity intolerance will decrease Outcome: Progressing   Problem: Nutrition: Goal: Adequate nutrition will be maintained Outcome: Progressing   Problem: Coping: Goal: Level of anxiety will decrease Outcome: Progressing   Problem: Elimination: Goal: Will not experience complications related to bowel motility Outcome: Progressing Goal: Will not experience complications related to urinary retention Outcome: Progressing   Problem: Pain Managment: Goal: General experience of comfort will improve Outcome: Progressing   Problem: Safety: Goal: Ability to remain free from injury will improve Outcome: Progressing   Problem: Skin Integrity: Goal: Risk for impaired skin integrity will decrease Outcome: Progressing   Problem: Clinical Measurements: Goal: Ability to maintain clinical measurements within normal limits will improve Outcome: Progressing Goal: Postoperative complications will be avoided or minimized Outcome: Progressing   Problem: Skin Integrity: Goal: Demonstration of  wound healing without infection will improve Outcome: Progressing

## 2018-01-22 ENCOUNTER — Inpatient Hospital Stay: Payer: BLUE CROSS/BLUE SHIELD

## 2018-01-22 LAB — GLUCOSE, CAPILLARY
GLUCOSE-CAPILLARY: 116 mg/dL — AB (ref 70–99)
GLUCOSE-CAPILLARY: 129 mg/dL — AB (ref 70–99)
GLUCOSE-CAPILLARY: 137 mg/dL — AB (ref 70–99)
Glucose-Capillary: 141 mg/dL — ABNORMAL HIGH (ref 70–99)
Glucose-Capillary: 145 mg/dL — ABNORMAL HIGH (ref 70–99)

## 2018-01-22 LAB — PHOSPHORUS: PHOSPHORUS: 3.4 mg/dL (ref 2.5–4.6)

## 2018-01-22 LAB — MAGNESIUM: MAGNESIUM: 1.8 mg/dL (ref 1.7–2.4)

## 2018-01-22 LAB — POTASSIUM: Potassium: 3.5 mmol/L (ref 3.5–5.1)

## 2018-01-22 MED ORDER — FAT EMULSION PLANT BASED 20 % IV EMUL
250.0000 mL | INTRAVENOUS | Status: AC
  Administered 2018-01-22: 250 mL via INTRAVENOUS
  Filled 2018-01-22: qty 250

## 2018-01-22 MED ORDER — SUMATRIPTAN SUCCINATE 6 MG/0.5ML ~~LOC~~ SOLN
6.0000 mg | Freq: Once | SUBCUTANEOUS | Status: AC
  Administered 2018-01-22: 6 mg via SUBCUTANEOUS
  Filled 2018-01-22: qty 0.5

## 2018-01-22 MED ORDER — DIPHENHYDRAMINE HCL 50 MG/ML IJ SOLN
25.0000 mg | Freq: Once | INTRAMUSCULAR | Status: AC
  Administered 2018-01-22: 25 mg via INTRAVENOUS
  Filled 2018-01-22: qty 1

## 2018-01-22 MED ORDER — TRACE MINERALS CR-CU-MN-SE-ZN 10-1000-500-60 MCG/ML IV SOLN
INTRAVENOUS | Status: AC
  Administered 2018-01-22: 18:00:00 via INTRAVENOUS
  Filled 2018-01-22: qty 1440

## 2018-01-22 MED ORDER — PHENOL 1.4 % MT LIQD
1.0000 | OROMUCOSAL | Status: DC | PRN
  Administered 2018-01-22 (×4): 1 via OROMUCOSAL
  Filled 2018-01-22: qty 177

## 2018-01-22 NOTE — Progress Notes (Addendum)
Imitrex order placed by MD for migraine  One time dose.

## 2018-01-22 NOTE — Progress Notes (Addendum)
KUB has been ordered as discussed by Dr. Rosana Hoes for NG tube placement verification.

## 2018-01-22 NOTE — Evaluation (Signed)
Physical Therapy Evaluation Patient Details Name: Tanya Nguyen MRN: [id number] DOB: Aug 16, 1965 Today's Date: 01/22/2018   History of Present Illness  presented to ER secondary to worsening abdominal pain; admitted for management of gastric outlet obstruction due to severly stenotic pyloris/duodenal bulb.  S/p exploratory laparotomy, gastrojejunostomy, jejunojejunostomy (11/15)  Clinical Impression  Upon evaluation, patient alert and oriented; follows all commands and demonstrates good effort with mobility tasks. Eager for functional recovery to be able to resume caregiving services for her mother.  Bilat UE/LE strength and ROM grossly symmetrical and WFL; no focal weakness or sensory deficit appreciated.  Able to complete sit/stand, basic transfers and gait (220') with RW, cga/close sup.  Steady and guarded (due to pain), but no overt buckling or LOB.  Prefers continued use of RW for optimal postural support and pain control at this time.  Encouraged continued mobility efforts with nursing/staff with use of RW (3x/day) as able. Would benefit from skilled PT to address above deficits and promote optimal return to PLOF; Recommend transition to [place] upon discharge from acute hospitalization.     Follow Up Recommendations Home health PT    Equipment Recommendations  Rolling walker with 5" wheels    Recommendations for Other Services       Precautions / Restrictions Precautions Precautions: Fall Precaution Comments: NGT, NPO Restrictions Weight Bearing Restrictions: No      Mobility  Bed Mobility               General bed mobility comments: seated in recliner beginning/end of treatment session  Transfers Overall transfer level: Needs assistance Equipment used: Rolling walker (2 wheeled) Transfers: Sit to/from Stand Sit to Stand: Min guard         General transfer comment: cuing for hand placement  Ambulation/Gait Ambulation/Gait assistance: Min guard Gait Distance  (Feet): 200 Feet Assistive device: Rolling walker (2 wheeled)       General Gait Details: reciprocal stepping pattern with slow, but steady, cadence; good LE strenght and control; prefers continued use of RW for postural support and pain control  Stairs            Wheelchair Mobility    Modified Rankin (Stroke Patients Only)       Balance Overall balance assessment: Needs assistance Sitting-balance support: No upper extremity supported;Feet supported Sitting balance-Leahy Scale: Good     Standing balance support: Bilateral upper extremity supported Standing balance-Leahy Scale: Fair                               Pertinent Vitals/Pain Pain Assessment: Faces Faces Pain Scale: Hurts even more Pain Location: abdomen Pain Descriptors / Indicators: Aching;Grimacing;Guarding Pain Intervention(s): Limited activity within patient's tolerance;Premedicated before session;Monitored during session;Repositioned    Home Living Family/patient expects to be discharged to:: Private residence Living Arrangements: Spouse/significant other(mother) Available Help at Discharge: Family Type of Home: House Home Access: Stairs to enter   [employer] of Steps: 3 front, no rails; 0 steps back entrance Home Layout: One level Home Equipment: None      Prior Function Level of Independence: Independent         Comments: Indep with ADLs, household and community mobilization without assist device; caregiver for mother within the home (assist with ADLs, household repsonsibilities)     Hand Dominance        Extremity/Trunk Assessment   Upper Extremity Assessment Upper Extremity Assessment: Overall WFL for tasks assessed    Lower Extremity  Assessment Lower Extremity Assessment: Overall WFL for tasks assessed(grossly at least 4-/5 throughout)       Communication   Communication: No difficulties  Cognition Arousal/Alertness: Awake/alert Behavior During  Therapy: WFL for tasks assessed/performed Overall Cognitive Status: Within Functional Limits for tasks assessed                                        General Comments      Exercises Other Exercises Other Exercises: Toilet transfer, ambalatory with RW, cga/close sup; sit/stand from standard toilet, cga/close sup; standing balance during hygiene, clothing management and hand hygiene at sink, close sup. Good safety awareness/insight into limits of stability and overall safety needs.   Assessment/Plan    PT Assessment Patient needs continued PT services  PT Problem List Decreased strength;Decreased activity tolerance;Decreased balance;Decreased mobility;Decreased knowledge of use of DME;Decreased safety awareness;Decreased knowledge of precautions;Pain;Decreased skin integrity       PT Treatment Interventions DME instruction;Gait training;Stair training;Functional mobility training;Therapeutic activities;Balance training;Therapeutic exercise;Patient/family education    PT Goals (Current goals can be found in the Care Plan section)  Acute Rehab PT Goals Patient Stated Goal: to return home PT Goal Formulation: With patient Time For Goal Achievement: 02/05/18 Potential to Achieve Goals: Good    Frequency Min 2X/week   Barriers to discharge        Co-evaluation               AM-PAC PT "6 Clicks" Daily Activity  Outcome Measure Difficulty turning over in bed (including adjusting bedclothes, sheets and blankets)?: A Little Difficulty moving from lying on back to sitting on the side of the bed? : A Little Difficulty sitting down on and standing up from a chair with arms (e.g., wheelchair, bedside commode, etc,.)?: Unable Help needed moving to and from a bed to chair (including a wheelchair)?: A Little Help needed walking in hospital room?: A Little Help needed climbing 3-5 steps with a railing? : A Little 6 Click Score: 16    End of Session Equipment  Utilized During Treatment: Gait belt Activity Tolerance: Patient tolerated treatment well Patient left: with call bell/phone within reach;in chair;with chair alarm set;with family/visitor present Nurse Communication: Mobility status PT Visit Diagnosis: Unsteadiness on feet (R26.81);Muscle weakness (generalized) (M62.81);Difficulty in walking, not elsewhere classified (R26.2)    Time: 8413-2440 PT Time Calculation (min) (ACUTE ONLY): 23 min   Charges:   PT Evaluation $PT Eval Moderate Complexity: 1 Mod PT Treatments $Therapeutic Activity: 8-22 mins       Deakin Lacek H. Owens Shark, PT, DPT, NCS 01/22/18, 12:13 PM [phone]

## 2018-01-22 NOTE — Progress Notes (Signed)
[place] Hospital Day(s): 3.   Post op day(s): 2 Days Post-Op.   Interval History: Patient seen and examined, no acute events or new complaints overnight. Patient reports persistent peri-incisional abdominal pain, though says her pain is noticeably better/less today than it was yesterday, and she denies flatus, BM, N/V, fever/chills, CP, or SOB. She says she ambulated once yesterday.  Review of Systems:  Constitutional: denies fever, chills  Respiratory: denies any shortness of breath  Cardiovascular: denies chest pain or palpitations  Gastrointestinal: abdominal pain, N/V, and bowel function as per interval history Musculoskeletal: denies pain, decreased motor or sensation Integumentary: denies any other rashes or skin discolorations except post-surgical abdominal wound  Vital signs in last 24 hours: [min-max] current  Temp:  [97.3 F (36.3 C)-98.5 F (36.9 C)] 97.8 F (36.6 C) (11/17 0910) Pulse Rate:  [80-95] 91 (11/17 0910) Resp:  [15-16] 16 (11/17 0910) BP: (93-129)/(47-65) 129/65 (11/17 0910) SpO2:  [97 %-98 %] 97 % (11/17 0910) Weight:  [45.9 kg] 45.9 kg (11/17 0501)     Height: 4\' 7"  (139.7 cm) Weight: 45.9 kg BMI (Calculated): 23.54   Intake/Output this shift:  Total I/O In: -  Out: 350 [Urine:300; Emesis/NG output:50]   Intake/Output last 2 shifts:  @IOLAST2SHIFTS @   Physical Exam:  Constitutional: alert, cooperative and no distress  Respiratory: breathing non-labored at rest  Cardiovascular: regular rate and sinus rhythm  Gastrointestinal: soft and non-distended with appropriate mild-/moderate- peri-incisional tenderness to palpation, no guarding or rebound tenderness, incision well-approximated without any peri-incisional erythema or drainage, bilious fluid from well-secured NG tube  Labs:  CBC Latest Ref Rng & Units 01/20/2018 01/20/2018 01/19/2018  WBC 4.0 - 10.5 K/uL 6.8 4.8 4.4  Hemoglobin 12.0 - 15.0 g/dL 9.8(L) 11.1(L) 13.5  Hematocrit  36.0 - 46.0 % 29.8(L) 34.0(L) 39.1  Platelets 150 - 400 K/uL 148(L) 193 239   CMP Latest Ref Rng & Units 01/22/2018 01/21/2018 01/20/2018  Glucose 70 - 99 mg/dL - - 123(H)  BUN 6 - 20 mg/dL - - 6  Creatinine 0.44 - 1.00 mg/dL - - 0.38(L)  Sodium 135 - 145 mmol/L - - 135  Potassium 3.5 - 5.1 mmol/L 3.5 3.7 3.4(L)  Chloride 98 - 111 mmol/L - - 106  CO2 22 - 32 mmol/L - - 23  Calcium 8.9 - 10.3 mg/dL - - 7.3(L)  Total Protein 6.5 - 8.1 g/dL - - 4.4(L)  Total Bilirubin 0.3 - 1.2 mg/dL - - 0.3  Alkaline Phos 38 - 126 U/L - - 34(L)  AST 15 - 41 U/L - - 16  ALT 0 - 44 U/L - - 9   Imaging studies: No new pertinent imaging studies  Assessment/Plan: 52 y.o. female with slowly improving post-surgical pain 2 Days Post-Op s/p open gastro-jejunal bypass for chronic NSAIDs-associated duodenal obstruction, complicated by pertinent comorbidities including GERD with history of bleeding duodenal ulcer, hypothyroidism, migraine headaches, nephrolithiasis, and major depression disorder.   - pain control as needed (ordered)             - continue NG tube for now with TPN ongoing             - anticipate NG tube over the weekend, possibly out tomorrow             - medical management of comorbidities, including PPI             - DVT prophylaxis, ambulation encouraged  All of the above findings and recommendations were discussed  with the patient and patient's RN, and all of patient's questions were answered to her expressed satisfaction.  -- Marilynne Drivers Rosana Hoes, MD, [place]: [facility] - Partnering for exceptional care. Office: [phone]

## 2018-01-22 NOTE — Progress Notes (Addendum)
Dr. Rosana Hoes notified-  patient vomited eventhough NG tube is still to suction. Vomited about 100cc of brown content similar to what had been in cannister earlier today. NG tube was irrigated and seems to be working.

## 2018-01-22 NOTE — Progress Notes (Addendum)
Benadryl given for itching.

## 2018-01-22 NOTE — Consult Note (Signed)
PHARMACY - ADULT TOTAL PARENTERAL NUTRITION CONSULT NOTE   Pharmacy Consult for TPN management Indication: SBO  Patient Measurements: Height: 4\' 7"  (139.7 cm) Weight: 101 lb 4.8 oz (45.9 kg) IBW/kg (Calculated) : 34 TPN AdjBW (KG): 37.1 Body mass index is 23.54 kg/m.  Assessment: 52 year old female who presented to the ED on 11/14 with chronic abdominal pain. She had a PICC line placed yesterday, 11/14 with TPN to be started today after Ex-lap and gastrojejunostomy scheduled for this afternoon with Dr. Hampton Abbot. She has an NGT to low intermittent suction. No output recorded in I&O's at this time. She reports her UBW as 140 lbs and states that she last weighed this in June 2019 with a 40 lb weight loss over the course of 5 months and states that her most recent weight at her PCP was 102 lbs. This is a 20.5 lb weight loss since 07/28/17. (16.7% weight loss in less than 6 months).  GI: PPI Endo: SSI, on Synthroid 78mcg daily (last TSH 10/14 wnl) Insulin requirements in the past 24 hours: none Lytes: WNL Renal: SCr<1 Pulm: Cards:  Hepatobil: Neuro: ID: WBC wnl, afebrile MRSA PCR (-) No current antibiotics TPN Access: 01/19/18 TPN start date: 01/20/18 Nutritional Goals (per RD recommendation on 01/20/18): KCal: 1022 Protein: 72g Fluid: 148ml  Goal TPN rate is 60 ml/hr (provides 90% of patient needs)  Current Nutrition:   Plan:  E 5/15 TPN at 61mL/hr + 20% lipids at 53ml/hr for 12 hours  This TPN provides 48 g of protein, 144 g of dextrose, and 36 g of lipids which provides 681 kCals per day, meeting 60% of patient needs Electrolytes in TPN: E 5/15, no additional Add MVI, trace elements and IV thiamine 100mg  x 3days to TPN sensitive q6h SSI and adjust as needed LR IVMF at 50 ml/hr Monitor TPN labs, Mg, P and K daily for first 3 days.   11/16:  Phosphorous and potassium are WNL. Advance rate of TPN to goal of 60 ml/hr.   Belem Hintze K, [place] 01/22/2018,9:42 AM

## 2018-01-23 ENCOUNTER — Encounter: Payer: Self-pay | Admitting: Surgery

## 2018-01-23 LAB — CBC
HCT: 19.6 % — ABNORMAL LOW (ref 36.0–46.0)
HEMOGLOBIN: 6.4 g/dL — AB (ref 12.0–15.0)
MCH: 29.6 pg (ref 26.0–34.0)
MCHC: 32.7 g/dL (ref 30.0–36.0)
MCV: 90.7 fL (ref 80.0–100.0)
Platelets: 149 10*3/uL — ABNORMAL LOW (ref 150–400)
RBC: 2.16 MIL/uL — ABNORMAL LOW (ref 3.87–5.11)
RDW: 12.4 % (ref 11.5–15.5)
WBC: 7.8 10*3/uL (ref 4.0–10.5)
nRBC: 0 % (ref 0.0–0.2)

## 2018-01-23 LAB — COMPREHENSIVE METABOLIC PANEL
ALK PHOS: 30 U/L — AB (ref 38–126)
ALT: 9 U/L (ref 0–44)
AST: 16 U/L (ref 15–41)
Albumin: 2.4 g/dL — ABNORMAL LOW (ref 3.5–5.0)
Anion gap: 3 — ABNORMAL LOW (ref 5–15)
BUN: 23 mg/dL — ABNORMAL HIGH (ref 6–20)
CALCIUM: 7.5 mg/dL — AB (ref 8.9–10.3)
CO2: 33 mmol/L — ABNORMAL HIGH (ref 22–32)
CREATININE: 0.39 mg/dL — AB (ref 0.44–1.00)
Chloride: 100 mmol/L (ref 98–111)
Glucose, Bld: 187 mg/dL — ABNORMAL HIGH (ref 70–99)
Potassium: 3.2 mmol/L — ABNORMAL LOW (ref 3.5–5.1)
SODIUM: 136 mmol/L (ref 135–145)
Total Bilirubin: 0.2 mg/dL — ABNORMAL LOW (ref 0.3–1.2)
Total Protein: 4.6 g/dL — ABNORMAL LOW (ref 6.5–8.1)

## 2018-01-23 LAB — DIFFERENTIAL
Abs Immature Granulocytes: 0.06 10*3/uL (ref 0.00–0.07)
BASOS PCT: 0 %
Basophils Absolute: 0 10*3/uL (ref 0.0–0.1)
Eosinophils Absolute: 0.2 10*3/uL (ref 0.0–0.5)
Eosinophils Relative: 3 %
Immature Granulocytes: 1 %
LYMPHS ABS: 0.4 10*3/uL — AB (ref 0.7–4.0)
LYMPHS PCT: 5 %
MONOS PCT: 5 %
Monocytes Absolute: 0.4 10*3/uL (ref 0.1–1.0)
NEUTROS ABS: 6.8 10*3/uL (ref 1.7–7.7)
NEUTROS PCT: 86 %

## 2018-01-23 LAB — GLUCOSE, CAPILLARY
GLUCOSE-CAPILLARY: 117 mg/dL — AB (ref 70–99)
GLUCOSE-CAPILLARY: 127 mg/dL — AB (ref 70–99)
Glucose-Capillary: 171 mg/dL — ABNORMAL HIGH (ref 70–99)
Glucose-Capillary: 146 mg/dL — ABNORMAL HIGH (ref 70–99)

## 2018-01-23 LAB — PHOSPHORUS: PHOSPHORUS: 2.9 mg/dL (ref 2.5–4.6)

## 2018-01-23 LAB — PREPARE RBC (CROSSMATCH)

## 2018-01-23 LAB — HEMOGLOBIN AND HEMATOCRIT, BLOOD
HCT: 27.1 % — ABNORMAL LOW (ref 36.0–46.0)
Hemoglobin: 9.2 g/dL — ABNORMAL LOW (ref 12.0–15.0)

## 2018-01-23 LAB — MAGNESIUM: Magnesium: 1.7 mg/dL (ref 1.7–2.4)

## 2018-01-23 MED ORDER — POTASSIUM CHLORIDE 2 MEQ/ML IV SOLN
INTRAVENOUS | Status: DC
  Administered 2018-01-23 – 2018-01-29 (×7): via INTRAVENOUS
  Filled 2018-01-23 (×10): qty 1000

## 2018-01-23 MED ORDER — FAT EMULSION PLANT BASED 20 % IV EMUL
250.0000 mL | INTRAVENOUS | Status: AC
  Administered 2018-01-23: 250 mL via INTRAVENOUS
  Filled 2018-01-23: qty 250

## 2018-01-23 MED ORDER — LACTATED RINGERS IV SOLN: INTRAVENOUS | Status: DC

## 2018-01-23 MED ORDER — HYDROMORPHONE HCL 1 MG/ML IJ SOLN
0.5000 mg | INTRAMUSCULAR | Status: DC | PRN
  Administered 2018-01-23 – 2018-01-27 (×22): 0.5 mg via INTRAVENOUS
  Filled 2018-01-23 (×21): qty 0.5

## 2018-01-23 MED ORDER — ACETAMINOPHEN 10 MG/ML IV SOLN: 1000.0000 mg | Freq: Three times a day (TID) | INTRAVENOUS | Status: DC

## 2018-01-23 MED ORDER — SODIUM CHLORIDE 0.9 % IV SOLN
8.0000 mg/h | INTRAVENOUS | Status: AC
  Administered 2018-01-23 – 2018-01-25 (×6): 8 mg/h via INTRAVENOUS
  Filled 2018-01-23 (×6): qty 80

## 2018-01-23 MED ORDER — PROMETHAZINE HCL 25 MG/ML IJ SOLN
25.0000 mg | Freq: Four times a day (QID) | INTRAMUSCULAR | Status: DC | PRN
  Administered 2018-01-23 – 2018-01-26 (×6): 25 mg via INTRAVENOUS
  Filled 2018-01-23 (×6): qty 1

## 2018-01-23 MED ORDER — TRACE MINERALS CR-CU-MN-SE-ZN 10-1000-500-60 MCG/ML IV SOLN
INTRAVENOUS | Status: AC
  Administered 2018-01-23: 18:00:00 via INTRAVENOUS
  Filled 2018-01-23: qty 1440

## 2018-01-23 MED ORDER — ACETAMINOPHEN 10 MG/ML IV SOLN
1000.0000 mg | Freq: Three times a day (TID) | INTRAVENOUS | Status: AC
  Administered 2018-01-23 (×2): 1000 mg via INTRAVENOUS
  Filled 2018-01-23 (×2): qty 100

## 2018-01-23 MED ORDER — SODIUM CHLORIDE 0.9% IV SOLUTION
Freq: Once | INTRAVENOUS | Status: AC
  Administered 2018-01-23: 07:00:00 via INTRAVENOUS

## 2018-01-23 MED ORDER — LORAZEPAM 2 MG/ML IJ SOLN
1.0000 mg | Freq: Once | INTRAMUSCULAR | Status: AC
  Administered 2018-01-23: 1 mg via INTRAVENOUS
  Filled 2018-01-23: qty 1

## 2018-01-23 MED ORDER — POTASSIUM CHLORIDE CRYS ER 20 MEQ PO TBCR: 40.0000 meq | EXTENDED_RELEASE_TABLET | Freq: Once | ORAL | Status: DC

## 2018-01-23 NOTE — Progress Notes (Signed)
PT Cancellation Note  Patient Details Name: Tanya Nguyen MRN: [id number] DOB: February 07, 1966   Cancelled Treatment:    Reason Eval/Treat Not Completed: Medical issues which prohibited therapy(Hgb 6.4).  Per protocol, will hold PT until pt more medically appropriate.  PT will continue to follow acutely.   Collie Siad PT, DPT 01/23/2018, 8:33 AM

## 2018-01-23 NOTE — Progress Notes (Addendum)
This RN assumed care of this patient from [facility]. Patient c/o pain and she had Toradol IV scheduled at [facility] and this RN went in to give it. Introduced myself that I would be taking care of her till about 0730. The following conversation went on between the patient and this RN.  Patient stated, "there was a  a white nurse and now is a black nurse." The RN responded , is there a problem for a black nurse taking care of you? Patient stated, "The other black lady came here to prick my finger and she was so disrespectful to me." Patient referring to [facility] NT. This RN offered an apology and asked if she would allow me to give her the pain medication and the Novolog per sliding scale. Patient indicated she would take the medications and afterwards would like  the white nurse to come back and apologize to her for not bringing her pain medicine. Patient endorsed, "you'll might think I'm racist but I'm not." Patient stated, "I'm going to report all of you to the your directors, the board and added, that's why this hospital is going down."  Rosann Auerbach and the charge RN went to her  room and talked to the patient. After their conversation, Rosann Auerbach came to let me know that patient doesn't want me or the NT to take care of her.

## 2018-01-23 NOTE — Progress Notes (Signed)
OT Cancellation Note  Patient Details Name: Boots Mcglown MRN: [id number] DOB: 1965/10/03   Cancelled Treatment:    Reason Eval/Treat Not Completed: Patient at procedure or test/ unavailable(Pt currently getting a blood transfusion. )  Order received, chart reviewed. Pt. Currently getting a blood transfusion, hemoglobin is currently 6.4;  Will evaluate at later time/date as pt is available and medically appropriate.   Luna Fuse Billy Rocco OTS  01/23/2018, 7:50 AM

## 2018-01-23 NOTE — Progress Notes (Signed)
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 Pt states she also has been on Zoloft and many other anxiety meds in the past but most frequently Zoloft. Also reqeusting that the psychiatrist re-assess what she should be on.

## 2018-01-23 NOTE — Progress Notes (Signed)
When entering room with charge nurse and talking with the patient she was tearful. She states that she could hear everyone "laughing at her in the hallway", reassured that they were not and that there was other staff present at the nurses station. Made aware that I had not left that I just had not gotten back into the room because I was in another room helping another patient at that time. She stated she was sorry that she got so upset, but everyone had been nice and wanted to keep me as her nurse. Reassurance given that we were doing the best to take care of her and her wishes, pt agrees to staff changes but keep me as her nurse tonight. She states she had a lot on her taking care of her mom. Allowed her to ventilate her feelings, support given.

## 2018-01-23 NOTE — Care Management (Deleted)
RNCM assessment completed Full note to follow  

## 2018-01-23 NOTE — Progress Notes (Addendum)
[facility] Progress Note  3 Days Post-Op  Subjective: Her hgb this morning is 6.4 with bloody drainage from NGT. She continues to endorse diffuse abdominal pain but denied any fever, chills, nausea, or emesis. She has remained NPO and does not endorse passing flatus or hematochezia.   Objective: Vital signs in last 24 hours: Temp:  [97.8 F (36.6 C)-98.7 F (37.1 C)] 98.2 F (36.8 C) (11/18 0818) Pulse Rate:  [70-96] 96 (11/18 0818) Resp:  [14-18] 16 (11/18 0818) BP: (114-132)/(45-67) 122/67 (11/18 0818) SpO2:  [97 %-99 %] 99 % (11/18 0818) Weight:  [46.8 kg] 46.8 kg (11/18 0500) Last BM Date: 01/11/18  Intake/Output from previous day: 11/17 0701 - 11/18 0700 In: 4000.6 [I.V.:3970.6; NG/GT:30] Out: 2875 [Urine:1175; Emesis/NG output:1700] Intake/Output this shift: No intake/output data recorded.  PE: Gen:  Alert, NAD, pleasant HEENT: NGT in place with bloody drainage in canister and tubing  Pulm:  Normal effort Abd: Soft, diffuse peri-incisional tenderness, non-distended, upper midline laparotomy incision is CDI, no erythema or drainage Skin: warm and dry, no rashes  Psych: A&Ox3   Lab Results:  Recent Labs    01/20/18 1823 01/23/18 0251  WBC 6.8 7.8  HGB 9.8* 6.4*  HCT 29.8* 19.6*  PLT 148* 149*   BMET Recent Labs    01/20/18 1823  01/22/18 0512 01/23/18 0251  NA 135  --   --  136  K 3.4*   < > 3.5 3.2*  CL 106  --   --  100  CO2 23  --   --  33*  GLUCOSE 123*  --   --  187*  BUN 6  --   --  23*  CREATININE 0.38*  --   --  0.39*  CALCIUM 7.3*  --   --  7.5*   < > = values in this interval not displayed.   PT/INR No results for input(s): LABPROT, INR in the last 72 hours. CMP     Component Value Date/Time   NA 136 01/23/2018 0251   NA 140 04/08/2014 1913   K 3.2 (L) 01/23/2018 0251   K 3.8 04/08/2014 1913   CL 100 01/23/2018 0251   CL 103 04/08/2014 1913   CO2 33 (H) 01/23/2018 0251   CO2 33 (H) 04/08/2014 1913   GLUCOSE 187  (H) 01/23/2018 0251   GLUCOSE 92 04/08/2014 1913   BUN 23 (H) 01/23/2018 0251   BUN 19 (H) 04/08/2014 1913   CREATININE 0.39 (L) 01/23/2018 0251   CREATININE 0.98 04/08/2014 1913   CALCIUM 7.5 (L) 01/23/2018 0251   CALCIUM 9.2 04/08/2014 1913   PROT 4.6 (L) 01/23/2018 0251   PROT 7.1 04/08/2014 1913   ALBUMIN 2.4 (L) 01/23/2018 0251   ALBUMIN 4.0 04/08/2014 1913   AST 16 01/23/2018 0251   AST 28 04/08/2014 1913   ALT 9 01/23/2018 0251   ALT 16 04/08/2014 1913   ALKPHOS 30 (L) 01/23/2018 0251   ALKPHOS 69 04/08/2014 1913   BILITOT 0.2 (L) 01/23/2018 0251   BILITOT 0.2 04/08/2014 1913   GFRNONAA >60 01/23/2018 0251   GFRNONAA >60 04/08/2014 1913   GFRAA >60 01/23/2018 0251   GFRAA >60 04/08/2014 1913   Lipase     Component Value Date/Time   LIPASE 40 01/19/2018 1424   LIPASE 167 04/08/2014 1913       Studies/Results: Dg Abd 1 View  Result Date: 01/22/2018 CLINICAL DATA:  NG tube placement EXAM: ABDOMEN - 1 VIEW COMPARISON:  01/19/2018 FINDINGS:  NG tube tip is in the mid stomach with the side port near the GE junction. Nonobstructive bowel gas pattern. Visualized lung bases clear. IMPRESSION: NG tube tip in the mid stomach. Electronically Signed   By: Rolm Baptise M.D.   On: 01/22/2018 18:27    Anti-infectives: Anti-infectives (From admission, onward)   None       Assessment/Plan  Gastric Outlet Obstruction Tanya Nguyen is a 52 y.o. female with acute blood loss anemia likely secondary to bleeding ulcer vs bleeding anastomosis who is 3 days s/p open gastro-jejunal bypass for chronic NSAIDs-associated duodenal obstruction, complicated by pertinent comorbidities includingGERD with history of bleeding duodenal ulcer, hypothyroidism, migraine headaches, nephrolithiasis, and major depression disorder    - NPO, TPN, IVF   - Monitor on going bowel function and abdominal examination  - Transfuse 1 unit pRBC and monitor H&H  - D/C Toradol and added IV Tylenol to reduce  bleeding risk  - Pain control as needed (minimize narcotics)  - If anemia and bleeding persists, will consult with gastroenterology for further revaluation for source of bleeding likely either from bleeding gastric ulcer vs bleeding from anastomosis  - Mobilize as tolerated (if hgb improves), PT/OT following  - Hold DVT prophylaxis given anemia, SCDs   Edison Simon , PA-C [facility] 01/23/2018, 8:29 AM [phone] M-F: 7am - 4pm

## 2018-01-23 NOTE — Progress Notes (Signed)
Nutrition Follow Up Note   DOCUMENTATION CODES:   Severe malnutrition in context of chronic illness  INTERVENTION:    Recommend continue Clinimix 5/15 with electrolytes at goal rate 60 ml/hr until patient is able meet at least 60% of her needs via oral intake.    Continue 20% lipids @15ml/hr x 12 hrs   Regimen at goal rate provides 1382 kcal/day, 72 g/day protein, 1620 ml volume   Continue MVI daily   Continue trace elements daily  - Daily weights   - Keep total rate between IVF and TPN @100ml/hr per MD  NUTRITION DIAGNOSIS:   Severe Malnutrition related to chronic illness (gastric outlet obstruction, chronic abdominal pain) as evidenced by moderate fat depletion, severe muscle depletion, energy intake < or equal to 75% for > or equal to 1 month, percent weight loss (16.7% weight loss in less than 6 months).  GOAL:   Patient will meet greater than or equal to 90% of their needs  -met with TPN  MONITOR:   I & O's, Skin, Weight trends, Labs, Diet advancement, Other (Comment)(TPN)  ASSESSMENT:   52-year-old female who presented to the ED on 11/14 with chronic abdominal pain. PMH significant for gastric outlet obstruction and recent EGD showing significant food bezoar in the stomach with marked distension and pyloric stenosis, pyloric edema, and ulceration. PMH also significant for bipolar 1 disorder, depression, GERD, hypothyroidism, and a history of bleeding duodenal ulcers.   Pt now s/p open gastro-jejunal bypass for chronic NSAIDs-associated duodenal obstruction  Pt continues on TPN and is tolerating well. Recommend continue TPN until patient is able to meet at least 60% of her estimated needs via oral intake. NGT in place with 1700ml output. Pt reports continued abdominal pain today. No nausea or vomiting. No flatus or BM yet. Pt receiving blood transfusion today after drop in hemoglobin. Plan to decrease IVF rate to 40ml/hr after transfusion. Labs ok; K slightly low today  and is being replaced. Blood glucoses creeping up; will continue to monitor. Per chart, pt up ~2lbs since admit; RD will continue to monitor.    Medications reviewed and include: insulin, synthroid, KCl, LRS 100ml/hr (will decrease to 40ml/hr after transfusion), protonix, hydromorphone   Labs reviewed: K 3.2(L), BUN 23(H), creat 0.39(L), P 2.9 wnl, Mg 1.7 wnl Triglycerides- 35- 11/15 Hgb 6.4(L), Hct 19.6(L) cbgs- 137, 129, 141, 145, 171 x 24 hrs  Diet Order:   Diet Order            Diet NPO time specified  Diet effective now             EDUCATION NEEDS:   Education needs have been addressed  Skin:  Incision abdomen   Last BM:  11/6 (per pt)  Height:   Ht Readings from Last 1 Encounters:  01/19/18 4' 7" (1.397 m)    Weight:   Wt Readings from Last 1 Encounters:  01/23/18 46.8 kg    Ideal Body Weight:  34.09 kg  BMI:  Body mass index is 23.98 kg/m.  Estimated Nutritional Needs:   Kcal:  1300-1500kcal/day   Protein:  60-70g/day   Fluid:  >1.3L/day     MS, RD, LDN Pager #- [phone] Office#- [phone] After Hours Pager: [phone]  

## 2018-01-23 NOTE — Consult Note (Signed)
PHARMACY - ADULT TOTAL PARENTERAL NUTRITION CONSULT NOTE   Pharmacy Consult for TPN management Indication: SBO  Patient Measurements: Height: 4\' 7"  (139.7 cm) Weight: 103 lb 2.8 oz (46.8 kg) IBW/kg (Calculated) : 34 TPN AdjBW (KG): 37.1 Body mass index is 23.98 kg/m.  Assessment: 52 year old female who presented to the ED on 11/14 with chronic abdominal pain. She had a PICC line placed yesterday, 11/14 with TPN to be started today after Ex-lap and gastrojejunostomy scheduled for this afternoon with Dr. Hampton Abbot. She has an NGT to low intermittent suction. No output recorded in I&O's at this time. She reports her UBW as 140 lbs and states that she last weighed this in June 2019 with a 40 lb weight loss over the course of 5 months and states that her most recent weight at her PCP was 102 lbs. This is a 20.5 lb weight loss since 07/28/17. (16.7% weight loss in less than 6 months).  GI: PPI Endo: SSI, on Synthroid 91mcg daily (last TSH 10/14 wnl) Insulin requirements in the past 24 hours: 5 units Lytes: slightly hypokalemic Renal: SCr<1  Pulm: Cards:  Hepatobil: Neuro: ID: WBC wnl, afebrile MRSA PCR (-) No current antibiotics TPN Access: 01/19/18 TPN start date: 01/20/18 Nutritional Goals (per RD recommendation on 01/20/18): KCal: 1022 Protein: 72g Fluid: 1463ml  Goal TPN rate is 60 ml/hr (provides 90% of patient needs)  Current Nutrition:   Plan:  E 5/15 TPN at 95mL/hr + 20% lipids at 56ml/hr for 12 hours This TPN provides 72 g of protein, 216 g of dextrose, and 36 g of lipids which provides 1022 kCals per day, meeting 90% of patient needs Electrolytes in TPN: E 5/15, no additional Add MVI, trace elements  sensitive q6h SSI and adjust as needed LR IVMF at 40 ml/hr (decreased from 131ml/hr) for a total of 2443ml/day Monitor TPN labs, Mg, P and K daily for first 3 days, then M, Th thereafter 11/18:  potassium 3.2, replaced with 14mEq po KCl.    Dallie Piles,  PharmD 01/23/2018,10:12 AM

## 2018-01-23 NOTE — Progress Notes (Addendum)
I have been in frequent communication with Dr. Hampton Abbot and his PA Thedore Mins all day, as well as the charge nurse Guadalupe. This patient has needed hourly nursing attention for pain, nausea, NG tube drainage, IV nutrition, medications and patient/family teaching. Patient has been anxious and worried all day despite frequent emotional support, spousal support and friends. Patient refuses to watch tv to help with distraction for pain control.  Patient has been rating her pain a "10" to "7" all day even with IV Dilaudid and IV Tylenol every 3-4 hours.

## 2018-01-23 NOTE — Progress Notes (Signed)
Dr Hampton Abbot called and gave orders to change protonix to a drip and discontinue oral protonix and toradol.  IV tylenol also ordered

## 2018-01-24 ENCOUNTER — Inpatient Hospital Stay: Payer: BLUE CROSS/BLUE SHIELD | Admitting: Anesthesiology

## 2018-01-24 ENCOUNTER — Encounter
Admission: EM | Disposition: A | Discharge status: Home Health | Payer: Self-pay | Source: Home / Self Care | Attending: Surgery

## 2018-01-24 ENCOUNTER — Inpatient Hospital Stay: Payer: BLUE CROSS/BLUE SHIELD

## 2018-01-24 DIAGNOSIS — K920 Hematemesis: Secondary | ICD-10-CM

## 2018-01-24 DIAGNOSIS — F3130 Bipolar disorder, current episode depressed, mild or moderate severity, unspecified: Secondary | ICD-10-CM

## 2018-01-24 LAB — PREPARE RBC (CROSSMATCH)

## 2018-01-24 LAB — CBC
HEMATOCRIT: 19 % — AB (ref 36.0–46.0)
HEMATOCRIT: 24.9 % — AB (ref 36.0–46.0)
HEMOGLOBIN: 6.4 g/dL — AB (ref 12.0–15.0)
Hemoglobin: 8.4 g/dL — ABNORMAL LOW (ref 12.0–15.0)
MCH: 30 pg (ref 26.0–34.0)
MCH: 29.1 pg (ref 26.0–34.0)
MCHC: 33.7 g/dL (ref 30.0–36.0)
MCHC: 33.7 g/dL (ref 30.0–36.0)
MCV: 89.2 fL (ref 80.0–100.0)
MCV: 86.2 fL (ref 80.0–100.0)
Platelets: 189 10*3/uL (ref 150–400)
Platelets: 201 10*3/uL (ref 150–400)
RBC: 2.13 MIL/uL — ABNORMAL LOW (ref 3.87–5.11)
RBC: 2.89 MIL/uL — ABNORMAL LOW (ref 3.87–5.11)
RDW: 13.5 % (ref 11.5–15.5)
RDW: 14.6 % (ref 11.5–15.5)
WBC: 7.8 10*3/uL (ref 4.0–10.5)
WBC: 7.8 10*3/uL (ref 4.0–10.5)
nRBC: 0 % (ref 0.0–0.2)
nRBC: 0 % (ref 0.0–0.2)

## 2018-01-24 LAB — BASIC METABOLIC PANEL
Anion gap: 4 — ABNORMAL LOW (ref 5–15)
BUN: 18 mg/dL (ref 6–20)
CALCIUM: 7.8 mg/dL — AB (ref 8.9–10.3)
CHLORIDE: 104 mmol/L (ref 98–111)
CO2: 31 mmol/L (ref 22–32)
CREATININE: 0.44 mg/dL (ref 0.44–1.00)
GFR calc non Af Amer: >60 mL/min (ref >60)
GLUCOSE: 107 mg/dL — AB (ref 70–99)
Potassium: 3.8 mmol/L (ref 3.5–5.1)
Sodium: 139 mmol/L (ref 135–145)

## 2018-01-24 LAB — GLUCOSE, CAPILLARY
Glucose-Capillary: 109 mg/dL — ABNORMAL HIGH (ref 70–99)
Glucose-Capillary: 99 mg/dL (ref 70–99)

## 2018-01-24 LAB — POTASSIUM: Potassium: 3.8 mmol/L (ref 3.5–5.1)

## 2018-01-24 LAB — SURGICAL PATHOLOGY

## 2018-01-24 SURGERY — ESOPHAGOGASTRODUODENOSCOPY (EGD) WITH PROPOFOL
Anesthesia: General

## 2018-01-24 MED ORDER — SUMATRIPTAN SUCCINATE 6 MG/0.5ML ~~LOC~~ SOLN
6.0000 mg | Freq: Once | SUBCUTANEOUS | Status: AC
  Administered 2018-01-24: 6 mg via SUBCUTANEOUS
  Filled 2018-01-24: qty 0.5

## 2018-01-24 MED ORDER — IOHEXOL 300 MG/ML  SOLN
75.0000 mL | Freq: Once | INTRAMUSCULAR | Status: AC | PRN
  Administered 2018-01-24: 75 mL via INTRAVENOUS

## 2018-01-24 MED ORDER — IOPAMIDOL (ISOVUE-300) INJECTION 61%
15.0000 mL | Freq: Once | INTRAVENOUS | Status: AC | PRN
  Administered 2018-01-24: 15 mL via ORAL

## 2018-01-24 MED ORDER — LORAZEPAM BOLUS VIA INFUSION: 1.0000 mg | INTRAVENOUS | Status: DC | PRN

## 2018-01-24 MED ORDER — SODIUM CHLORIDE 0.9% IV SOLUTION
Freq: Once | INTRAVENOUS | Status: AC
  Administered 2018-01-24: 08:00:00 via INTRAVENOUS

## 2018-01-24 MED ORDER — LORAZEPAM 2 MG/ML IJ SOLN
1.0000 mg | INTRAMUSCULAR | Status: DC | PRN
  Administered 2018-01-24 – 2018-01-26 (×8): 1 mg via INTRAVENOUS
  Filled 2018-01-24 (×8): qty 1

## 2018-01-24 MED ORDER — SODIUM CHLORIDE 0.9 % IV SOLN: INTRAVENOUS | Status: DC

## 2018-01-24 MED ORDER — ACETAMINOPHEN 10 MG/ML IV SOLN
1000.0000 mg | Freq: Four times a day (QID) | INTRAVENOUS | Status: AC
  Administered 2018-01-24 – 2018-01-25 (×4): 1000 mg via INTRAVENOUS
  Filled 2018-01-24 (×4): qty 100

## 2018-01-24 MED ORDER — TRACE MINERALS CR-CU-MN-SE-ZN 10-1000-500-60 MCG/ML IV SOLN
INTRAVENOUS | Status: AC
  Administered 2018-01-24: 18:00:00 via INTRAVENOUS
  Filled 2018-01-24: qty 1440

## 2018-01-24 MED ORDER — DIATRIZOATE MEGLUMINE & SODIUM 66-10 % PO SOLN
210.0000 mL | Freq: Once | ORAL | Status: AC
  Administered 2018-01-24: 210 mL via NASOGASTRIC
  Filled 2018-01-24: qty 240

## 2018-01-24 MED ORDER — FAT EMULSION PLANT BASED 20 % IV EMUL
250.0000 mL | INTRAVENOUS | Status: AC
  Administered 2018-01-24: 250 mL via INTRAVENOUS
  Filled 2018-01-24: qty 250

## 2018-01-24 NOTE — H&P (Addendum)
Jonathon Bellows, MD [address] [place], [place], [place], [address] [address], [place], [place], [place], [address] Phone: [phone]  Fax: [phone]  Primary Care Physician:  Pleas Koch, NP   Pre-Procedure History & Physical: HPI:  Tanya Nguyen is a 52 y.o. female is here for an endoscopy    Past Medical History:  Diagnosis Date  . Bleeding duodenal ulcer   . Depression   . GERD (gastroesophageal reflux disease)   . Hypothyroidism   . Kidney stones   . Migraine headache    only1-2x/month since starting topamax    Past Surgical History:  Procedure Laterality Date  . ABDOMINAL HYSTERECTOMY  2004   partial  . CHOLECYSTECTOMY    . ESOPHAGOGASTRODUODENOSCOPY (EGD) WITH PROPOFOL N/A 01/12/2018   Procedure: ESOPHAGOGASTRODUODENOSCOPY (EGD) WITH BIOPSIES;  Surgeon: [name], MD;  Location: [facility];  Service: Endoscopy;  Laterality: N/A;  . LAPAROTOMY N/A 01/20/2018   Procedure: EXPLORATORY LAPAROTOMY;  Surgeon: Olean Ree, MD;  Location: [facility];  Service: General;  Laterality: N/A;  . TOOTH EXTRACTION      Prior to Admission medications   Medication Sig Start Date End Date Taking? Authorizing Provider  cetirizine (ZYRTEC) 10 MG tablet Take 10 mg by mouth daily as needed for allergies.    Yes [provider]  levothyroxine (SYNTHROID, LEVOTHROID) 100 MCG tablet Take 1 tablet by mouth every morning on an empty stomach with water. No food or other medications for 30 minutes. 12/21/17  Yes Pleas Koch, NP  pantoprazole (PROTONIX) 40 MG tablet Take 1 tablet (40 mg total) by mouth 2 (two) times daily. 01/13/18  Yes [name], MD  sucralfate (CARAFATE) 1 g tablet Take 1 tablet (1 g total) by mouth 3 (three) times daily before meals. 12/19/17  Yes Ria Bush, MD  SUMAtriptan (IMITREX) 25 MG tablet TAKE 1 TABLET EVERY 2 HOURS AS NEEDED FOR MIGRAINE, MAY REPEAT IN 2 HOURS IF HEADACHE PERSISTS OR RECURS 11/16/17  Yes Pleas Koch, NP  topiramate (TOPAMAX) 25 MG tablet Take 1 tablet by mouth at bedtime for migraine prevention. 12/06/17  Yes Pleas Koch, NP  traZODone (DESYREL) 150 MG tablet Take 1 tablet (150 mg total) by mouth at bedtime. 06/21/17  Yes Pleas Koch, NP  cyanocobalamin (,VITAMIN B-12,) 1000 MCG/ML injection Inject into the skin once monthly for 6 months for low vitamin B 12. Patient not taking: Reported on 01/09/2018 03/31/17   Pleas Koch, NP  gabapentin (NEURONTIN) 100 MG capsule Take 1 capsule (100 mg total) by mouth 3 (three) times daily. Patient not taking: Reported on 01/10/2018 09/21/17   Edrick Kins, DPM  Na Sulfate-K Sulfate-Mg Sulf (SUPREP BOWEL PREP KIT) 17.5-3.13-1.6 GM/177ML SOLN Take 1 kit by mouth as directed. 01/09/18   [name], MD  NONFORMULARY OR COMPOUNDED ITEM See pharmacy note Patient not taking: Reported on 01/10/2018 08/04/17   Edrick Kins, DPM  omeprazole (PRILOSEC) 40 MG capsule Take 1 capsule (40 mg total) by mouth every morning. Patient not taking: Reported on 01/19/2018 08/26/17   Copland, Frederico Hamman, MD  ondansetron (ZOFRAN) 4 MG tablet Take 1 tablet (4 mg total) by mouth every 8 (eight) hours as needed for nausea or vomiting. Patient not taking: Reported on 01/10/2018 12/19/17   Ria Bush, MD  ranitidine (ZANTAC) 150 MG tablet Take 1 tablet by mouth at bedtime Patient not taking: Reported on 01/19/2018 08/26/17   Owens Loffler, MD  sertraline (ZOLOFT) 50 MG  tablet TAKE 1 TABLET BY MOUTH ONCE A DAY Patient not taking: Reported on 01/19/2018 06/21/17   Pleas Koch, NP  SYRINGE-NEEDLE, DISP, 3 ML (B-D 3CC LUER-LOK SYR 25GX1") 25G X 1" 3 ML MISC Use as instructed to injection B12 every 30 days Patient not taking: Reported on 01/12/2018 03/31/17   Pleas Koch, NP    Allergies as of 01/19/2018 - Review Complete 01/19/2018  Allergen Reaction Noted  . Penicillins Shortness Of Breath, Diarrhea, and Nausea And Vomiting 02/26/2015  .  Sulfa antibiotics Nausea And Vomiting 06/26/2012    Family History  Problem Relation Age of Onset  . Cancer Mother   . Breast cancer Mother 55  . Cancer Father   . Alcohol abuse Sister   . Bipolar disorder Sister   . Alcohol abuse Maternal Grandmother   . Stroke Maternal Grandmother   . Stroke Paternal Grandmother     Social History   Socioeconomic History  . Marital status: Married    Spouse name: Not on file  . Number of children: Not on file  . Years of education: Not on file  . Highest education level: Not on file  Occupational History  . Not on file  Social Needs  . Financial resource strain: Not on file  . Food insecurity:    Worry: Not on file    Inability: Not on file  . Transportation needs:    Medical: Not on file    Non-medical: Not on file  Tobacco Use  . Smoking status: Never Smoker  . Smokeless tobacco: Never Used  Substance and Sexual Activity  . Alcohol use: Not Currently    Alcohol/week: 0.0 standard drinks  . Drug use: No  . Sexual activity: Yes    Birth control/protection: None  Lifestyle  . Physical activity:    Days per week: Not on file    Minutes per session: Not on file  . Stress: Not on file  Relationships  . Social connections:    Talks on phone: Not on file    Gets together: Not on file    Attends religious service: Not on file    Active member of club or organization: Not on file    Attends meetings of clubs or organizations: Not on file    Relationship status: Not on file  . Intimate partner violence:    Fear of current or ex partner: Not on file    Emotionally abused: Not on file    Physically abused: Not on file    Forced sexual activity: Not on file  Other Topics Concern  . Not on file  Social History Narrative  . Not on file    Review of Systems: See HPI, otherwise negative ROS  Physical Exam: BP 123/67 (BP Location: Left Arm)   Pulse (!) 106   Temp 98.3 F (36.8 C) (Oral)   Resp 18   Ht 4' 7"  (1.397 m)   Wt  49.7 kg   SpO2 100%   BMI 25.47 kg/m  General:   Alert,  pleasant and cooperative in NAD Head:  Normocephalic and atraumatic. Neck:  Supple; no masses or thyromegaly. Lungs:  Clear throughout to auscultation, normal respiratory effort.    Heart:  +S1, +S2, Regular rate and rhythm, No edema. Abdomen:  Non distended, generalized tenderness all over the abdomen , BS+, ?guarding  Neurologic:  Alert and  oriented x4;  grossly normal neurologically.  Impression/Plan: Tanya Nguyen is here for an endoscopy  to be performed  for  evaluation of GI bleed. I discussed with Dr Hampton Abbot and he will examine the patient to determine if any further imaging is needed before endoscopy due to tenderness ?post op related.     Risks, benefits, limitations, and alternatives regarding endoscopy have been reviewed with the patient.  Questions have been answered.  All parties agreeable.   Jonathon Bellows, MD  01/24/2018, 11:50 AM

## 2018-01-24 NOTE — Progress Notes (Signed)
OT Cancellation Note  Patient Details Name: Axelle Szwed MRN: [id number] DOB: 1965-03-11   Cancelled Treatment:    Reason Eval/Treat Not Completed: Patient at procedure or test/ unavailable. Pt out of room for testing. EGD noted. Will hold OT evaluation today and continue to follow acutely for medical appropriateness for OT evaluation.   Jeni Salles, MPH, MS, OTR/L ascom [phone] 01/24/18, 12:13 PM

## 2018-01-24 NOTE — Care Management (Signed)
Patient POD 3 Exploratory Laparotomy, gastrojejunostomy, jejunojejunostomy.  Patient lives at home with her husband, and mother.  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  At baseline patient is completley independent.  Patient states that she has a RW and cane in the home if needed.  Ph[name]. Patient denies any issues obtaining medications.    PT has assessed patient and recommends home health PT. Patient agreeable, and states that she would like to use the same company that is coming to see her mother.  RNCM confirmed that the patient's mother is being seen by [facility].  Heads up referral made to [place] with [facility].    Patient expresses care giver strain for her husband who is now having to care for her mother while the patient is in the hospital.  RNCM provided PCS services list at additional resource in addition to the home health services she is already receiving.

## 2018-01-24 NOTE — Progress Notes (Signed)
Called Dr. Windell Moment regarding iv anxiety medication and stronger nausea medication per patient request.  Patient currently takes zoloft for anxiety.  Appropriate orders were placed.  [name]  01/24/2018 6:38 AM

## 2018-01-24 NOTE — Consult Note (Signed)
[facility] Face-to-Face Psychiatry Consult   Reason for Consult: Consult for this 52 year old woman with a history of chronic mood and anxiety symptoms currently in the hospital with gastric outlet problems related to ulcers.  Concern about anxiety chronic pain and overuse of medicine Referring Physician: Piscoya Patient Identification: Tanya Nguyen MRN:  [id number] Principal Diagnosis: Bipolar I disorder, most recent episode depressed ([place]) Diagnosis:  Principal Problem:   Bipolar I disorder, most recent episode depressed ([place]) Active Problems:   PTSD (post-traumatic stress disorder)   Gastric outlet obstruction   Protein-calorie malnutrition, severe   Acquired pyloric stricture   Total Time spent with patient: 1 hour  Subjective:   Tanya Nguyen is a 52 y.o. female patient admitted with "I feel terrible".  HPI: Patient seen chart reviewed.  53 year old woman who is currently in the hospital with nausea vomiting abdominal pain gastric outlet problems.  Continues to be on nasal suction with bloody discharge from her got.  Patient has a history of chronic anxiety and depression.  Has not been able to take her usual psychiatric medicine because of her inability to take oral medicine.  Patient tells me that she is feeling terrible.  She is clearly in physical distress and is not able to participate very fully in the interview.  Mood is bad.  Not sleeping well.  Having a lot of pain.  Feeling nervous and anxious.  Denies having any suicidal thoughts.  Does not report psychotic symptoms.  Patient does say that she uses an excessive number of BC powders.  She says she is been doing this for about 30 years using up to 4 of them a day despite not really having any specific indication for them.  Denies other current substance abuse.  Social history: Lives at home with her mother for whom she feels responsible and her husband.  Describes a lot of anxiety related to her mother's health as well.  Medical  history: Gastric outlet obstruction from pyloric stricture presumed active bleeding ulcers.  Substance abuse history: No past history identified of substance abuse patient denies alcohol or drug abuse.  Past Psychiatric History: Patient has been seen previously for anxiety symptoms and been diagnosed with PTSD possibly also with bipolar disorder.  Was on antidepressant medication prior to hospitalization.  Has been receiving some IV doses of Ativan since being in the hospital.  No history of suicide attempts.  Risk to Self:   Risk to Others:   Prior Inpatient Therapy:   Prior Outpatient Therapy:    Past Medical History:  Past Medical History:  Diagnosis Date  . Bleeding duodenal ulcer   . Depression   . GERD (gastroesophageal reflux disease)   . Hypothyroidism   . Kidney stones   . Migraine headache    only1-2x/month since starting topamax    Past Surgical History:  Procedure Laterality Date  . ABDOMINAL HYSTERECTOMY  2004   partial  . CHOLECYSTECTOMY    . ESOPHAGOGASTRODUODENOSCOPY (EGD) WITH PROPOFOL N/A 01/12/2018   Procedure: ESOPHAGOGASTRODUODENOSCOPY (EGD) WITH BIOPSIES;  Surgeon: [name], MD;  Location: [facility];  Service: Endoscopy;  Laterality: N/A;  . LAPAROTOMY N/A 01/20/2018   Procedure: EXPLORATORY LAPAROTOMY;  Surgeon: Olean Ree, MD;  Location: [facility];  Service: General;  Laterality: N/A;  . TOOTH EXTRACTION     Family History:  Family History  Problem Relation Age of Onset  . Cancer Mother   . Breast cancer Mother 52  . Cancer Father   . Alcohol abuse Sister   .  Bipolar disorder Sister   . Alcohol abuse Maternal Grandmother   . Stroke Maternal Grandmother   . Stroke Paternal Grandmother    Family Psychiatric  History: Not identified by me but the patient has reported some alcohol abuse in the family Social History:  Social History   Substance and Sexual Activity  Alcohol Use Not Currently  . Alcohol/week: 0.0 standard drinks      Social History   Substance and Sexual Activity  Drug Use No    Social History   Socioeconomic History  . Marital status: Married    Spouse name: Not on file  . Number of children: Not on file  . Years of education: Not on file  . Highest education level: Not on file  Occupational History  . Not on file  Social Needs  . Financial resource strain: Not on file  . Food insecurity:    Worry: Not on file    Inability: Not on file  . Transportation needs:    Medical: Not on file    Non-medical: Not on file  Tobacco Use  . Smoking status: Never Smoker  . Smokeless tobacco: Never Used  Substance and Sexual Activity  . Alcohol use: Not Currently    Alcohol/week: 0.0 standard drinks  . Drug use: No  . Sexual activity: Yes    Birth control/protection: None  Lifestyle  . Physical activity:    Days per week: Not on file    Minutes per session: Not on file  . Stress: Not on file  Relationships  . Social connections:    Talks on phone: Not on file    Gets together: Not on file    Attends religious service: Not on file    Active member of club or organization: Not on file    Attends meetings of clubs or organizations: Not on file    Relationship status: Not on file  Other Topics Concern  . Not on file  Social History Narrative  . Not on file   Additional Social History:    Allergies:   Allergies  Allergen Reactions  . Penicillins Shortness Of Breath, Diarrhea and Nausea And Vomiting    Has patient had a PCN reaction causing immediate rash, facial/tongue/throat swelling, SOB or lightheadedness with hypotension: yes Has patient had a PCN reaction causing severe rash involving mucus membranes or skin necrosis: no Has patient had a PCN reaction that required hospitalization no Has patient had a PCN reaction occurring within the last 10 years: about 10 years If all of the above answers are "NO", then may proceed with Cephalosporin use.   . Sulfa Antibiotics Nausea And  Vomiting    Other reaction(s): VOMITING    Labs:  Results for orders placed or performed during the hospital encounter of 01/19/18 (from the past 48 hour(s))  Glucose, capillary     Status: Abnormal   Collection Time: 01/22/18  4:30 PM  Result Value Ref Range   Glucose-Capillary 141 (H) 70 - 99 mg/dL  Glucose, capillary     Status: Abnormal   Collection Time: 01/22/18 11:44 PM  Result Value Ref Range   Glucose-Capillary 145 (H) 70 - 99 mg/dL   Comment 1 Notify RN   Comprehensive metabolic panel     Status: Abnormal   Collection Time: 01/23/18  2:51 AM  Result Value Ref Range   Sodium 136 135 - 145 mmol/L   Potassium 3.2 (L) 3.5 - 5.1 mmol/L   Chloride 100 98 - 111 mmol/L  CO2 33 (H) 22 - 32 mmol/L   Glucose, Bld 187 (H) 70 - 99 mg/dL   BUN 23 (H) 6 - 20 mg/dL   Creatinine, Ser 0.39 (L) 0.44 - 1.00 mg/dL   Calcium 7.5 (L) 8.9 - 10.3 mg/dL   Total Protein 4.6 (L) 6.5 - 8.1 g/dL   Albumin 2.4 (L) 3.5 - 5.0 g/dL   AST 16 15 - 41 U/L   ALT 9 0 - 44 U/L   Alkaline Phosphatase 30 (L) 38 - 126 U/L   Total Bilirubin 0.2 (L) 0.3 - 1.2 mg/dL   GFR calc non Af Amer >60 >60 mL/min   GFR calc Af Amer >60 >60 mL/min    Comment: (NOTE) The eGFR has been calculated using the CKD EPI equation. This calculation has not been validated in all clinical situations. eGFR's persistently <60 mL/min signify possible Chronic Kidney Disease.    Anion gap 3 (L) 5 - 15    Comment: Performed at [facility], [address] [place], [place] [address]  Magnesium     Status: None   Collection Time: 01/23/18  2:51 AM  Result Value Ref Range   Magnesium 1.7 1.7 - 2.4 mg/dL    Comment: Performed at [facility], [address] [place], [place] [address]  Phosphorus     Status: None   Collection Time: 01/23/18  2:51 AM  Result Value Ref Range   Phosphorus 2.9 2.5 - 4.6 mg/dL    Comment: Performed at [facility], [address] [place], [place] [address]  CBC      Status: Abnormal   Collection Time: 01/23/18  2:51 AM  Result Value Ref Range   WBC 7.8 4.0 - 10.5 K/uL   RBC 2.16 (L) 3.87 - 5.11 MIL/uL   Hemoglobin 6.4 (L) 12.0 - 15.0 g/dL   HCT 19.6 (L) 36.0 - 46.0 %   MCV 90.7 80.0 - 100.0 fL   MCH 29.6 26.0 - 34.0 pg   MCHC 32.7 30.0 - 36.0 g/dL   RDW 12.4 11.5 - 15.5 %   Platelets 149 (L) 150 - 400 K/uL   nRBC 0.0 0.0 - 0.2 %    Comment: Performed at [facility], [address] [place], [place] [address]  Differential     Status: Abnormal   Collection Time: 01/23/18  2:51 AM  Result Value Ref Range   Neutrophils Relative % 86 %   Neutro Abs 6.8 1.7 - 7.7 K/uL   Lymphocytes Relative 5 %   Lymphs Abs 0.4 (L) 0.7 - 4.0 K/uL   Monocytes Relative 5 %   Monocytes Absolute 0.4 0.1 - 1.0 K/uL   Eosinophils Relative 3 %   Eosinophils Absolute 0.2 0.0 - 0.5 K/uL   Basophils Relative 0 %   Basophils Absolute 0.0 0.0 - 0.1 K/uL   Immature Granulocytes 1 %   Abs Immature Granulocytes 0.06 0.00 - 0.07 K/uL    Comment: Performed at [facility], [address] [place], [place] [address]  Prepare RBC     Status: None   Collection Time: 01/23/18  3:33 AM  Result Value Ref Range   Order Confirmation      ORDER PROCESSED BY BLOOD BANK Performed at [facility], [address] [place], [place] [address]   Type and screen [place]     Status: None (Preliminary result)   Collection Time: 01/23/18  4:33 AM  Result Value Ref Range  ABO/RH(D) O POS    Antibody Screen NEG    Sample Expiration 01/26/2018    Unit Number [id number]    Blood Component Type RBC LR PHER2    Unit division 00    Status of Unit REL FROM [facility]    Transfusion Status OK TO TRANSFUSE    Crossmatch Result Compatible    Unit Number [id number]    Blood Component Type RED CELLS,LR    Unit division 00    Status of Unit ISSUED,FINAL    Transfusion Status OK TO TRANSFUSE    Crossmatch Result Compatible    Unit Number  [id number]    Blood Component Type RED CELLS,LR    Unit division 00    Status of Unit ISSUED    Transfusion Status OK TO TRANSFUSE    Crossmatch Result      Compatible Performed at [facility], [address] [place], [place] [address]   Glucose, capillary     Status: Abnormal   Collection Time: 01/23/18  5:01 AM  Result Value Ref Range   Glucose-Capillary 171 (H) 70 - 99 mg/dL  Glucose, capillary     Status: Abnormal   Collection Time: 01/23/18 11:55 AM  Result Value Ref Range   Glucose-Capillary 146 (H) 70 - 99 mg/dL   Comment 1 Notify RN   Hemoglobin and hematocrit, blood     Status: Abnormal   Collection Time: 01/23/18 12:02 PM  Result Value Ref Range   Hemoglobin 9.2 (L) 12.0 - 15.0 g/dL    Comment: POST TRANSFUSION SPECIMEN   HCT 27.1 (L) 36.0 - 46.0 %    Comment: Performed at [facility], [address] [place], [place] [address]  Glucose, capillary     Status: Abnormal   Collection Time: 01/23/18  5:48 PM  Result Value Ref Range   Glucose-Capillary 127 (H) 70 - 99 mg/dL   Comment 1 Notify RN   Glucose, capillary     Status: Abnormal   Collection Time: 01/23/18 11:35 PM  Result Value Ref Range   Glucose-Capillary 117 (H) 70 - 99 mg/dL  Potassium     Status: None   Collection Time: 01/24/18  4:10 AM  Result Value Ref Range   Potassium 3.8 3.5 - 5.1 mmol/L    Comment: Performed at [facility], [address] [place], [place] [address]  CBC     Status: Abnormal   Collection Time: 01/24/18  4:10 AM  Result Value Ref Range   WBC 7.8 4.0 - 10.5 K/uL   RBC 2.13 (L) 3.87 - 5.11 MIL/uL   Hemoglobin 6.4 (L) 12.0 - 15.0 g/dL    Comment: REPEATED TO VERIFY   HCT 19.0 (L) 36.0 - 46.0 %   MCV 89.2 80.0 - 100.0 fL   MCH 30.0 26.0 - 34.0 pg   MCHC 33.7 30.0 - 36.0 g/dL   RDW 13.5 11.5 - 15.5 %   Platelets 189 150 - 400 K/uL   nRBC 0.0 0.0 - 0.2 %    Comment: Performed at [facility], [address] [place], [place]  [address]  Basic metabolic panel     Status: Abnormal   Collection Time: 01/24/18  4:10 AM  Result Value Ref Range   Sodium 139 135 - 145 mmol/L   Potassium 3.8 3.5 - 5.1 mmol/L   Chloride 104 98 - 111 mmol/L   CO2 31 22 - 32 mmol/L   Glucose, Bld 107 (H) 70 - 99 mg/dL  BUN 18 6 - 20 mg/dL   Creatinine, Ser 0.44 0.44 - 1.00 mg/dL   Calcium 7.8 (L) 8.9 - 10.3 mg/dL   GFR calc non Af Amer >60 >60 mL/min   GFR calc Af Amer >60 >60 mL/min    Comment: (NOTE) The eGFR has been calculated using the CKD EPI equation. This calculation has not been validated in all clinical situations. eGFR's persistently <60 mL/min signify possible Chronic Kidney Disease.    Anion gap 4 (L) 5 - 15    Comment: Performed at [facility], [address] [place], [place] [address]  Glucose, capillary     Status: Abnormal   Collection Time: 01/24/18  5:49 AM  Result Value Ref Range   Glucose-Capillary 109 (H) 70 - 99 mg/dL  Prepare RBC     Status: None   Collection Time: 01/24/18  7:33 AM  Result Value Ref Range   Order Confirmation      ORDER PROCESSED BY BLOOD BANK Performed at [facility], [address] [place], [place] [address]     Current Facility-Administered Medications  Medication Dose Route Frequency Provider Last Rate Last Dose  . Marland KitchenTPN (CLINIMIX-E) Adult   Intravenous Continuous TPN Dallie Piles, RPH 60 mL/hr at 01/24/18 0428    . Marland KitchenTPN (CLINIMIX-E) Adult   Intravenous Continuous TPN Dallie Piles, RPH      . acetaminophen (OFIRMEV) IV 1,000 mg  1,000 mg Intravenous Q6H Piscoya, Jose, MD 400 mL/hr at 01/24/18 1247 1,000 mg at 01/24/18 1247  . Fat emulsion 20 % infusion 250 mL  250 mL Intravenous Continuous TPN Dallie Piles, RPH      . hydrALAZINE (APRESOLINE) injection 10 mg  10 mg Intravenous Q2H PRN Piscoya, Jose, MD      . HYDROmorphone (DILAUDID) injection 0.5 mg  0.5 mg Intravenous Q3H PRN Piscoya, Jose, MD   0.5 mg at 01/24/18 1422  . insulin aspart (novoLOG)  injection 0-9 Units  0-9 Units Subcutaneous Q6H Olean Ree, MD   1 Units at 01/23/18 1814  . lactated ringers 1,000 mL with potassium chloride 20 mEq infusion   Intravenous Continuous Tylene Fantasia, PA-C 40 mL/hr at 01/24/18 1416    . levothyroxine (SYNTHROID, LEVOTHROID) injection 50 mcg  50 mcg Intravenous Daily Piscoya, Jose, MD   50 mcg at 01/24/18 0950  . ondansetron (ZOFRAN-ODT) disintegrating tablet 4 mg  4 mg Oral Q6H PRN Piscoya, Jose, MD       Or  . ondansetron (ZOFRAN) injection 4 mg  4 mg Intravenous Q6H PRN Olean Ree, MD   4 mg at 01/24/18 0910  . pantoprazole (PROTONIX) 80 mg in sodium chloride 0.9 % 250 mL (0.32 mg/mL) infusion  8 mg/hr Intravenous Continuous Piscoya, Jose, MD 25 mL/hr at 01/24/18 1122 8 mg/hr at 01/24/18 1122  . phenol (CHLORASEPTIC) mouth spray 1 spray  1 spray Mouth/Throat PRN Olean Ree, MD   1 spray at 01/22/18 2100  . promethazine (PHENERGAN) injection 25 mg  25 mg Intravenous Q6H PRN Herbert Pun, MD   25 mg at 01/24/18 1313  . sodium chloride flush (NS) 0.9 % injection 10-40 mL  10-40 mL Intracatheter Q12H Piscoya, Jose, MD   10 mL at 01/24/18 0915  . sodium chloride flush (NS) 0.9 % injection 10-40 mL  10-40 mL Intracatheter PRN Olean Ree, MD   10 mL at 01/21/18 0745    Musculoskeletal: Strength & Muscle Tone: decreased Gait & Station: unable to stand Patient leans: Right  Psychiatric Specialty Exam: Physical Exam  Nursing note and vitals reviewed. Constitutional: She appears well-developed and well-nourished.  HENT:  Head: Normocephalic and atraumatic.  Eyes: Pupils are equal, round, and reactive to light. Conjunctivae are normal.  Neck: Normal range of motion.  Cardiovascular: Regular rhythm and normal heart sounds.  Respiratory: Effort normal.  GI: There is tenderness.  Musculoskeletal: Normal range of motion.  Neurological: She is alert.  Skin: Skin is warm and dry.  Psychiatric: Judgment normal. Her mood appears  anxious. Her speech is delayed. She is slowed. Cognition and memory are normal. She expresses no suicidal ideation.    Review of Systems  Constitutional: Negative.   HENT: Negative.   Eyes: Negative.   Respiratory: Negative.   Cardiovascular: Negative.   Gastrointestinal: Positive for abdominal pain, heartburn, nausea and vomiting.  Musculoskeletal: Negative.   Skin: Negative.   Neurological: Negative.   Psychiatric/Behavioral: Positive for depression. Negative for hallucinations, memory loss, substance abuse and suicidal ideas. The patient is nervous/anxious. The patient does not have insomnia.     Blood pressure 123/67, pulse (!) 106, temperature 98.3 F (36.8 C), temperature source Oral, resp. rate 18, height _0  (1.397 m), weight 49.7 kg, SpO2 100 %.Body mass index is 25.47 kg/m.  General Appearance: Casual  Eye Contact:  Fair  Speech:  Slow, Slurred and Impaired by the presence of nasogastric tube  Volume:  Decreased  Mood:  Anxious, Depressed and Dysphoric  Affect:  Constricted  Thought Process:  Goal Directed  Orientation:  Full (Time, Place, and Person)  Thought Content:  Logical  Suicidal Thoughts:  No  Homicidal Thoughts:  No  Memory:  Immediate;   Fair Recent;   Fair Remote;   Fair  Judgement:  Fair  Insight:  Fair  Psychomotor Activity:  Decreased  Concentration:  Concentration: Fair  Recall:  [employer] of Knowledge:  Fair  Language:  Fair  Akathisia:  No  Handed:  Right  AIMS (if indicated):     Assets:  Desire for Improvement Housing Social Support  ADL's:  Impaired  Cognition:  Impaired,  Mild  Sleep:        Treatment Plan Summary: Daily contact with patient to assess and evaluate symptoms and progress in treatment, Medication management and Plan Patient with chronic anxiety and depression now with acute illness lots of physical discomfort.  Patient has had to come off of her antidepressant medicine.  Unfortunately there are no antidepressant  medicines that can be administered in any formula except for oral that she is able to take.  She is probably going to have some withdrawal symptoms from that.  We can try and manage her current bad feeling with as needed doses of intravenous Ativan and medicines for nausea as needed.  Patient acknowledges the overuse of BC powders.  She describes this as "addiction".  This is not a medicine that normally one thinks of as being addictive.  The only psychoactive component and it is caffeine.  Nevertheless it sounds like she is developed quite a habit around it.  We can address that further once she is more physically healthy and able to talk more.  Disposition: No evidence of imminent risk to self or others at present.   Patient does not meet criteria for psychiatric inpatient admission. Supportive therapy provided about ongoing stressors.  Alethia Berthold, MD 01/24/2018 3:15 PM

## 2018-01-24 NOTE — Progress Notes (Addendum)
[facility] Progress Note  4 Days Post-Op  Subjective: Patients hgb again low this morning at 6.4. She is complaining of diffuse abdominal pain which is better since the surgery as well as discomfort from the NGT tube.  Objective: Vital signs in last 24 hours: Temp:  [97.9 F (36.6 C)-98.3 F (36.8 C)] 98.3 F (36.8 C) (11/19 0425) Pulse Rate:  [80-122] 122 (11/19 0425) Resp:  [16-18] 16 (11/19 0425) BP: (104-129)/(51-85) 113/68 (11/19 0425) SpO2:  [97 %-100 %] 100 % (11/19 0425) Weight:  [49.7 kg] 49.7 kg (11/19 0429) Last BM Date: 01/11/18  Intake/Output from previous day: 11/18 0701 - 11/19 0700 In: 3568.5 [I.V.:3052.4; Blood:360; NG/GT:60; IV Piggyback:96] Out: 1749 [Urine:1325; Emesis/NG output:1850] Intake/Output this shift: No intake/output data recorded.  PE: Gen:  Alert, NAD, pleasant HEENT: NGT in place with coffee-ground drainage in canister and tubing  Pulm:  Normal effort Abd: Soft, diffuse peri-incisional tenderness, non-distended, upper midline laparotomy incision is CDI, no erythema or drainage Skin: warm and dry, no rashes  Psych: A&Ox3    Lab Results:  Recent Labs    01/23/18 0251 01/23/18 1202 01/24/18 0410  WBC 7.8  --  7.8  HGB 6.4* 9.2* 6.4*  HCT 19.6* 27.1* 19.0*  PLT 149*  --  189   BMET Recent Labs    01/23/18 0251 01/24/18 0410  NA 136 139  K 3.2* 3.8  3.8  CL 100 104  CO2 33* 31  GLUCOSE 187* 107*  BUN 23* 18  CREATININE 0.39* 0.44  CALCIUM 7.5* 7.8*   PT/INR No results for input(s): LABPROT, INR in the last 72 hours. CMP     Component Value Date/Time   NA 139 01/24/2018 0410   NA 140 04/08/2014 1913   K 3.8 01/24/2018 0410   K 3.8 01/24/2018 0410   K 3.8 04/08/2014 1913   CL 104 01/24/2018 0410   CL 103 04/08/2014 1913   CO2 31 01/24/2018 0410   CO2 33 (H) 04/08/2014 1913   GLUCOSE 107 (H) 01/24/2018 0410   GLUCOSE 92 04/08/2014 1913   BUN 18 01/24/2018 0410   BUN 19 (H) 04/08/2014 1913   CREATININE 0.44 01/24/2018 0410   CREATININE 0.98 04/08/2014 1913   CALCIUM 7.8 (L) 01/24/2018 0410   CALCIUM 9.2 04/08/2014 1913   PROT 4.6 (L) 01/23/2018 0251   PROT 7.1 04/08/2014 1913   ALBUMIN 2.4 (L) 01/23/2018 0251   ALBUMIN 4.0 04/08/2014 1913   AST 16 01/23/2018 0251   AST 28 04/08/2014 1913   ALT 9 01/23/2018 0251   ALT 16 04/08/2014 1913   ALKPHOS 30 (L) 01/23/2018 0251   ALKPHOS 69 04/08/2014 1913   BILITOT 0.2 (L) 01/23/2018 0251   BILITOT 0.2 04/08/2014 1913   GFRNONAA >60 01/24/2018 0410   GFRNONAA >60 04/08/2014 1913   GFRAA >60 01/24/2018 0410   GFRAA >60 04/08/2014 1913   Lipase     Component Value Date/Time   LIPASE 40 01/19/2018 1424   LIPASE 167 04/08/2014 1913       Studies/Results: Dg Abd 1 View  Result Date: 01/22/2018 CLINICAL DATA:  NG tube placement EXAM: ABDOMEN - 1 VIEW COMPARISON:  01/19/2018 FINDINGS: NG tube tip is in the mid stomach with the side port near the GE junction. Nonobstructive bowel gas pattern. Visualized lung bases clear. IMPRESSION: NG tube tip in the mid stomach. Electronically Signed   By: Rolm Baptise M.D.   On: 01/22/2018 18:27    Anti-infectives: Anti-infectives (From admission, onward)  None       Assessment/Plan  Gastric Outlet Obstruction Tanya Nguyen is a 52 y.o. female with acute blood loss anemia likely secondary to bleeding ulcer vs bleeding anastomosis who is 3 days s/p open gastro-jejunal bypass for chronic NSAIDs-associated duodenal obstruction, complicated by pertinent comorbidities includingGERD with history of bleeding duodenal ulcer, hypothyroidism, migraine headaches, nephrolithiasis, and major depression disorder    - Given persistent anemia and coffee-ground drainage from NGT, gastroenterololgy was consulted for EGD to assess for bleeding ulcers. Dr. Hampton Abbot spoke with Dr Vicente Males who would like to plan for EGD after transfusion hopefully this morning.             - NPO, TPN, IVF              -  Monitor on going bowel function and abdominal examination             - Transfuse 1 unit pRBC and monitor H&H             - Pain control as needed (minimize narcotics)              - Mobilize as tolerated (if hgb improves), PT/OT following             - Hold DVT prophylaxis given anemia, SCDs   - Psychiatry consult per patient request given expressed concern over NSAID addiction/Anxiety   -- Edison Simon , PA-C [facility] 01/24/2018, 8:01 AM [phone] M-F: 7am - 4pm

## 2018-01-24 NOTE — Progress Notes (Signed)
Jonathon Bellows , MD [address], [place], [place], [place], [address] [address], [place], [place], [place], [address] Phone: [phone]  Fax: [phone]   Tanya Nguyen is being followed for GI bleed   Subjective: Called to see patient, large qty of coffee ground material from NG tube yesterday with drop in Hb , none today , not had a bowel movement , mild abdominal pain which is better since surgery .    Objective: Vital signs in last 24 hours: Vitals:   01/23/18 2059 01/23/18 2322 01/24/18 0425 01/24/18 0429  BP: 127/60 107/60 113/68   Pulse: (!) 101 (!) 110 (!) 122   Resp: 18 18 16    Temp: 98 F (36.7 C) 98 F (36.7 C) 98.3 F (36.8 C)   TempSrc: Oral Oral Oral   SpO2: 97% 99% 100%   Weight:    49.7 kg  Height:       Weight change: 2.9 kg  Intake/Output Summary (Last 24 hours) at 01/24/2018 0804 Last data filed at 01/24/2018 2025 Gross per 24 hour  Intake 3568.45 ml  Output 3175 ml  Net 393.45 ml     Exam: Heart:: Regular rate and rhythm, S1S2 present or without murmur or extra heart sounds Lungs: normal, clear to auscultation and clear to auscultation and percussion Abdomen: soft mild generalized tenderness, no distension , bs+   Lab Results: @LABTEST2 @ Micro Results: Recent Results (from the past 240 hour(s))  MRSA PCR Screening     Status: None   Collection Time: 01/19/18  9:25 PM  Result Value Ref Range Status   MRSA by PCR NEGATIVE NEGATIVE Final    Comment:        The GeneXpert MRSA Assay (FDA approved for NASAL specimens only), is one component of a comprehensive MRSA colonization surveillance program. It is not intended to diagnose MRSA infection nor to guide or monitor treatment for MRSA infections. Performed at [facility], [address] [place], [place] [address]    Studies/Results: Dg Abd 1 View  Result Date: 01/22/2018 CLINICAL DATA:  NG tube placement EXAM: ABDOMEN - 1 VIEW COMPARISON:  01/19/2018 FINDINGS:  NG tube tip is in the mid stomach with the side port near the GE junction. Nonobstructive bowel gas pattern. Visualized lung bases clear. IMPRESSION: NG tube tip in the mid stomach. Electronically Signed   By: Rolm Baptise M.D.   On: 01/22/2018 18:27   Medications: I have reviewed the patient's current medications. Scheduled Meds: . sodium chloride   Intravenous Once  . insulin aspart  0-9 Units Subcutaneous Q6H  . levothyroxine  50 mcg Intravenous Daily  . sodium chloride flush  10-40 mL Intracatheter Q12H   Continuous Infusions: . Marland KitchenTPN (CLINIMIX-E) Adult 60 mL/hr at 01/24/18 0428  . lactated ringers with kcl 40 mL/hr at 01/23/18 1812  . pantoprozole (PROTONIX) infusion 8 mg/hr (01/24/18 0428)   PRN Meds:.hydrALAZINE, HYDROmorphone (DILAUDID) injection, ondansetron **OR** ondansetron (ZOFRAN) IV, phenol, promethazine, sodium chloride flush   Assessment: Active Problems:   Gastric outlet obstruction   Protein-calorie malnutrition, severe   Acquired pyloric stricture  Tanya Nguyen 52 y.o. female initially admitted with gastric outlet obstruction seen on EGD by Dr Allen Norris . 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 I have been asked to see her today for an overnight drop in Hb associated with large qty of coffee ground material from NG tube . None this morning, tube still attached to suction. No bowel movements for atleast a day  Plan: 1. Transfuse an unit of PRBC 2. IV PP infusion  3. EGD today  4. Strongly suggested to stop Goody poweder/NSAID's  I have discussed alternative options, risks & benefits,  which include, but are not limited to, bleeding, infection, perforation,respiratory complication & drug reaction.  The patient agrees with this plan & written consent will be obtained.      LOS: 5 days   Jonathon Bellows, MD 01/24/2018, 8:04 AM

## 2018-01-24 NOTE — Progress Notes (Signed)
OT Cancellation Note  Patient Details Name: Tanya Nguyen MRN: [id number] DOB: [birth year]   Cancelled Treatment:    Reason Eval/Treat Not Completed: Medical issues which prohibited therapy. Chart reviewed. Pt Hgb noted to drop again back down to 6.4 after blood transfusion previous date. Per protocol, will continue to hold OT evaluation at this time and re-attempt at later date/time as pt is medically appropriate.   Jeni Salles, MPH, MS, OTR/L ascom [phone] 01/24/18, 7:42 AM

## 2018-01-24 NOTE — Anesthesia Preprocedure Evaluation (Signed)
Anesthesia Evaluation  Patient identified by MRN, date of birth, ID band Patient awake    Reviewed: Allergy & Precautions, H&P , NPO status , Patient's Chart, lab work & pertinent test results  Airway        Dental   Pulmonary neg pulmonary ROS,           Cardiovascular negative cardio ROS       Neuro/Psych  Headaches, PSYCHIATRIC DISORDERS Anxiety Depression Bipolar Disorder    GI/Hepatic Neg liver ROS, PUD, GERD  ,  Endo/Other  negative endocrine ROSHypothyroidism   Renal/GU Renal disease  negative genitourinary   Musculoskeletal   Abdominal   Peds  Hematology negative hematology ROS (+)   Anesthesia Other Findings 52 y.o. female initially admitted with gastric outlet obstruction seen on EGD by Dr Allen Norris . 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 Presenting today for EGD due to overnight drop in Hb associated with large qty of coffee ground material from NG tube yesterday (none today).  Received 1 unit pRBC this AM for Hgb 6.4.  Reproductive/Obstetrics negative OB ROS                             Anesthesia Physical Anesthesia Plan  ASA: III  Anesthesia Plan: General   Post-op Pain Management:    Induction:   PONV Risk Score and Plan: Propofol infusion and TIVA  Airway Management Planned: Natural Airway and Nasal Cannula  Additional Equipment:   Intra-op Plan:   Post-operative Plan:   Informed Consent: I have reviewed the patients History and Physical, chart, labs and discussed the procedure including the risks, benefits and alternatives for the proposed anesthesia with the patient or authorized representative who has indicated his/her understanding and acceptance.   Dental Advisory Given  Plan Discussed with: Anesthesiologist  Anesthesia Plan Comments: (As pt is no longer actively vomiting, will plan for natural airway with intubation  if needed)        Anesthesia Quick Evaluation

## 2018-01-24 NOTE — Progress Notes (Signed)
PT Cancellation Note  Patient Details Name: Tanya Nguyen MRN: [id number] DOB: Jan 06, 1966   Cancelled Treatment:    Reason Eval/Treat Not Completed: Medical issues which prohibited therapy(Hgb 6.4).  Per protocol, will hold PT until pt more medically appropriate for exertional activity.   Collie Siad PT, DPT 01/24/2018, 2:56 PM

## 2018-01-24 NOTE — Consult Note (Signed)
PHARMACY - ADULT TOTAL PARENTERAL NUTRITION CONSULT NOTE   Pharmacy Consult for TPN management Indication: SBO  Patient Measurements: Height: 4\' 7"  (139.7 cm) Weight: 109 lb 9.1 oz (49.7 kg) IBW/kg (Calculated) : 34 TPN AdjBW (KG): 37.1 Body mass index is 25.47 kg/m.  Assessment: 52 year old female who presented to the ED on 11/14 with chronic abdominal pain. She had a PICC line placed yesterday, 11/14 with TPN to be started today after Ex-lap and gastrojejunostomy scheduled for this afternoon with Dr. Hampton Abbot. She has an NGT to low intermittent suction. No output recorded in I&O's at this time. She reports her UBW as 140 lbs and states that she last weighed this in June 2019 with a 40 lb weight loss over the course of 5 months and states that her most recent weight at her PCP was 102 lbs. This is a 20.5 lb weight loss since 07/28/17. (16.7% weight loss in less than 6 months).  GI: PPI Endo: SSI, on Synthroid 7mcg daily (last TSH 10/14 wnl) Insulin requirements in the past 24 hours: 5 units Lytes: slightly hypokalemic Renal: SCr<1  Pulm: Cards:  Hepatobil: Neuro: ID: WBC wnl, afebrile MRSA PCR (-) No current antibiotics TPN Access: 01/19/18 TPN start date: 01/20/18 Nutritional Goals (per RD recommendation on 01/20/18): KCal: 1022 Protein: 72g Fluid: 144ml  Goal TPN rate is 60 ml/hr (provides 90% of patient needs)  Current Nutrition:   Plan:  E 5/15 TPN at 58mL/hr + 20% lipids at 57ml/hr for 12 hours This TPN provides 72 g of protein, 216 g of dextrose, and 36 g of lipids which provides 1022 kCals per day, meeting 90% of patient needs Electrolytes in TPN: E 5/15, no additional Add MVI, trace elements  sensitive q6h SSI and adjust as needed LR IVMF at 40 ml/hr for a total of 2422ml/day Monitor TPN labs, Mg, P and K daily for first 3 days, then M, Th thereafter 11/19: electrolytes wnl  Dallie Piles, PharmD 01/24/2018,1:46 PM

## 2018-01-24 NOTE — Care Management (Signed)
RNCM reached out to MD to see if he would like to purse LTACH.  Awaiting response

## 2018-01-25 ENCOUNTER — Encounter
Admission: EM | Disposition: A | Discharge status: Home Health | Payer: Self-pay | Source: Home / Self Care | Attending: Surgery

## 2018-01-25 ENCOUNTER — Inpatient Hospital Stay: Payer: BLUE CROSS/BLUE SHIELD | Admitting: Registered Nurse

## 2018-01-25 DIAGNOSIS — K311 Adult hypertrophic pyloric stenosis: Principal | ICD-10-CM

## 2018-01-25 DIAGNOSIS — K922 Gastrointestinal hemorrhage, unspecified: Secondary | ICD-10-CM

## 2018-01-25 HISTORY — PX: ESOPHAGOGASTRODUODENOSCOPY (EGD) WITH PROPOFOL: SHX5813

## 2018-01-25 LAB — CBC WITH DIFFERENTIAL/PLATELET
Abs Immature Granulocytes: 0.04 10*3/uL (ref 0.00–0.07)
Basophils Absolute: 0.1 10*3/uL (ref 0.0–0.1)
Basophils Relative: 1 %
EOS ABS: 0.3 10*3/uL (ref 0.0–0.5)
Eosinophils Relative: 5 %
HEMATOCRIT: 22.7 % — AB (ref 36.0–46.0)
Hemoglobin: 7.5 g/dL — ABNORMAL LOW (ref 12.0–15.0)
Immature Granulocytes: 1 %
Lymphocytes Relative: 10 %
Lymphs Abs: 0.8 10*3/uL (ref 0.7–4.0)
MCH: 28.5 pg (ref 26.0–34.0)
MCHC: 33 g/dL (ref 30.0–36.0)
MCV: 86.3 fL (ref 80.0–100.0)
MONO ABS: 0.8 10*3/uL (ref 0.1–1.0)
Monocytes Relative: 11 %
NEUTROS ABS: 5.6 10*3/uL (ref 1.7–7.7)
Neutrophils Relative %: 72 %
Platelets: 187 10*3/uL (ref 150–400)
RBC: 2.63 MIL/uL — AB (ref 3.87–5.11)
RDW: 14.6 % (ref 11.5–15.5)
WBC: 7.6 10*3/uL (ref 4.0–10.5)
nRBC: 0 % (ref 0.0–0.2)

## 2018-01-25 LAB — TYPE AND SCREEN
ABO/RH(D): O POS
Antibody Screen: NEGATIVE
UNIT DIVISION: 0
UNIT DIVISION: 0
Unit division: 0

## 2018-01-25 LAB — BPAM RBC
BLOOD PRODUCT EXPIRATION DATE: 201912092359
Blood Product Expiration Date: 201911292359
Blood Product Expiration Date: 201912092359
ISSUE DATE / TIME: 201911180635
ISSUE DATE / TIME: 201911190814
Unit Type and Rh: 5100
Unit Type and Rh: 5100
Unit Type and Rh: 9500

## 2018-01-25 LAB — BASIC METABOLIC PANEL
Anion gap: 8 (ref 5–15)
BUN: 19 mg/dL (ref 6–20)
CO2: 29 mmol/L (ref 22–32)
CREATININE: 0.56 mg/dL (ref 0.44–1.00)
Calcium: 7.7 mg/dL — ABNORMAL LOW (ref 8.9–10.3)
Chloride: 102 mmol/L (ref 98–111)
GFR calc Af Amer: >60 mL/min (ref >60)
GFR calc non Af Amer: >60 mL/min (ref >60)
GLUCOSE: 124 mg/dL — AB (ref 70–99)
Potassium: 3.8 mmol/L (ref 3.5–5.1)
SODIUM: 139 mmol/L (ref 135–145)

## 2018-01-25 LAB — GLUCOSE, CAPILLARY
GLUCOSE-CAPILLARY: 116 mg/dL — AB (ref 70–99)
Glucose-Capillary: 107 mg/dL — ABNORMAL HIGH (ref 70–99)
Glucose-Capillary: 109 mg/dL — ABNORMAL HIGH (ref 70–99)
Glucose-Capillary: 118 mg/dL — ABNORMAL HIGH (ref 70–99)

## 2018-01-25 LAB — HEMOGLOBIN AND HEMATOCRIT, BLOOD
HCT: 25.2 % — ABNORMAL LOW (ref 36.0–46.0)
Hemoglobin: 8.2 g/dL — ABNORMAL LOW (ref 12.0–15.0)

## 2018-01-25 SURGERY — ESOPHAGOGASTRODUODENOSCOPY (EGD) WITH PROPOFOL
Anesthesia: General

## 2018-01-25 MED ORDER — FAT EMULSION PLANT BASED 20 % IV EMUL
250.0000 mL | INTRAVENOUS | Status: AC
  Administered 2018-01-25: 250 mL via INTRAVENOUS
  Filled 2018-01-25: qty 250

## 2018-01-25 MED ORDER — PROPOFOL 500 MG/50ML IV EMUL
INTRAVENOUS | Status: DC | PRN
  Administered 2018-01-25: 150 ug/kg/min via INTRAVENOUS

## 2018-01-25 MED ORDER — TRACE MINERALS CR-CU-MN-SE-ZN 10-1000-500-60 MCG/ML IV SOLN
INTRAVENOUS | Status: AC
  Administered 2018-01-25: 18:00:00 via INTRAVENOUS
  Filled 2018-01-25: qty 1440

## 2018-01-25 MED ORDER — LIDOCAINE 2% (20 MG/ML) 5 ML SYRINGE
INTRAMUSCULAR | Status: DC | PRN
  Administered 2018-01-25: 40 mg via INTRAVENOUS

## 2018-01-25 MED ORDER — PROPOFOL 10 MG/ML IV BOLUS
INTRAVENOUS | Status: DC | PRN
  Administered 2018-01-25: 100 mg via INTRAVENOUS

## 2018-01-25 MED ORDER — SODIUM CHLORIDE 0.9 % IV SOLN
INTRAVENOUS | Status: DC | PRN
  Administered 2018-01-25: 14:00:00 via INTRAVENOUS

## 2018-01-25 NOTE — Op Note (Addendum)
[facility] Gastroenterology Patient Name: Tanya Nguyen Procedure Date: 01/25/2018 1:27 PM MRN: [id number] Account #: [id number] Date of Birth: 1965-12-15 Admit Type: Inpatient Age: 52 Room: [facility] ENDO ROOM 1 Gender: Female Note Status: Finalized Procedure:            Upper GI endoscopy Indications:          Coffee-ground emesis Providers:            Jonathon Bellows MD, MD Referring MD:         Pleas Koch (Referring MD) Medicines:            Monitored Anesthesia Care Complications:        No immediate complications. Procedure:            Pre-Anesthesia Assessment:                       - Prior to the procedure, a History and Physical was                        performed, and patient medications, allergies and                        sensitivities were reviewed. The patient's tolerance of                        previous anesthesia was reviewed.                       - The risks and benefits of the procedure and the                        sedation options and risks were discussed with the                        patient. All questions were answered and informed                        consent was obtained.                       - ASA Grade Assessment: III - A patient with severe                        systemic disease.                       After obtaining informed consent, the endoscope was                        passed under direct vision. Throughout the procedure,                        the patient's blood pressure, pulse, and oxygen                        saturations were monitored continuously. The Endoscope                        was introduced through the mouth, and advanced to the  pylorus. The upper GI endoscopy was accomplished with                        ease. The patient tolerated the procedure well. Findings:      The esophagus was normal.      A medium amount of food (residue) was found in the gastric antrum.      Evidence of a  gastrojejunostomy was found in the gastric antrum. This       was characterized by severe stenosis and ulceration.The scope could not       pass through the anastamosis. There was a shallow ulceration at the       anastamosis with no active bleeding. The pylorus also appeared stenosed       and the scope coulod not pass through. NG tube removed to help pass the       scope down. No blood or coffee ground material in the stomach      The cardia and gastric fundus were normal on retroflexion. Impression:           - Normal esophagus.                       - A medium amount of food (residue) in the stomach.                       - A gastrojejunostomy was found, characterized by                        severe stenosis and ulceration.                       - No specimens collected. Recommendation:       - Return patient to hospital ward for ongoing care.                       - 1. Keep NPO                       2. High dose PPI                       3. Check for H pylori stool antigen and treat if                        positive                       4. When planned to restart feeding, get imaging to                        determine if the anastamosis is patent enough                       - If NG tube is warranted thyen will need to be                        reinserted Procedure Code(s):    --- Professional ---                       08144, 52, Esophagogastroduodenoscopy, flexible,  transoral; diagnostic, including collection of                        specimen(s) by brushing or washing, when performed                        (separate procedure) Diagnosis Code(s):    --- Professional ---                       Z98.0, Intestinal bypass and anastomosis status                       K92.0, Hematemesis CPT copyright 2018 [employer] Association. All rights reserved. The codes documented in this report are preliminary and upon coder review may  be revised to meet current  compliance requirements. Jonathon Bellows, MD Jonathon Bellows MD, MD 01/25/2018 1:50:35 PM This report has been signed electronically. Number of Addenda: 0 Note Initiated On: 01/25/2018 1:27 PM      [facility]

## 2018-01-25 NOTE — Consult Note (Signed)
PHARMACY - ADULT TOTAL PARENTERAL NUTRITION CONSULT NOTE   Pharmacy Consult for TPN management Indication: SBO  Patient Measurements: Height: 4\' 7"  (139.7 cm) Weight: 105 lb 6.1 oz (47.8 kg) IBW/kg (Calculated) : 34 TPN AdjBW (KG): 37.1 Body mass index is 24.49 kg/m.  Assessment: 52 year old female who presented to the ED on 11/14 with chronic abdominal pain. She had a PICC line placed yesterday, 11/14 with TPN to be started today after Ex-lap and gastrojejunostomy scheduled for this afternoon with Dr. Hampton Abbot. She has an NGT to low intermittent suction. No output recorded in I&O's at this time. She reports her UBW as 140 lbs and states that she last weighed this in June 2019 with a 40 lb weight loss over the course of 5 months and states that her most recent weight at her PCP was 102 lbs. This is a 20.5 lb weight loss since 07/28/17. (16.7% weight loss in less than 6 months).  GI: PPI Endo: SSI, on Synthroid 45mcg IV daily (last TSH 10/14 wnl) Insulin requirements in the past 24 hours: 0 units Lytes: wnl Renal: SCr<1  Pulm: Cards:  Hepatobil: Neuro: ID: WBC wnl, afebrile MRSA PCR (-) No current antibiotics TPN Access: 01/19/18 TPN start date: 01/20/18 Nutritional Goals (per RD recommendation on 01/20/18): KCal: 1022 Protein: 72g Fluid: 1463ml  Goal TPN rate is 60 ml/hr (provides 90% of patient needs)  Current Nutrition:   Plan:  E 5/15 TPN at 65mL/hr + 20% lipids at 61ml/hr for 12 hours This TPN provides 72 g of protein, 216 g of dextrose, and 36 g of lipids which provides 1022 kCals per day, meeting 90% of patient needs Electrolytes in TPN: E 5/15, no additional Add MVI, trace elements  sensitive q6h SSI and adjust as needed LR20 IVMF at 40 ml/hr for a total of 2446ml/day Monitor TPN labs, Mg, P and K  Monday and Thursday  11/12: electrolytes wnl  Dallie Piles, PharmD 01/25/2018,1:54 PM

## 2018-01-25 NOTE — Transfer of Care (Signed)
Immediate Anesthesia Transfer of Care Note  Patient: Tanya Nguyen  Procedure(s) Performed: ESOPHAGOGASTRODUODENOSCOPY (EGD) WITH PROPOFOL (N/A )  Patient Location: PACU and Endoscopy Unit  Anesthesia Type:General  Level of Consciousness: drowsy  Airway & Oxygen Therapy: Patient Spontanous Breathing and Patient connected to nasal cannula oxygen  Post-op Assessment: Report given to RN and Post -op Vital signs reviewed and stable  Post vital signs: Reviewed and stable  Last Vitals:  Vitals Value Taken Time  BP 114/70 01/25/2018  1:51 PM  Temp    Pulse 114 01/25/2018  1:51 PM  Resp 13 01/25/2018  1:51 PM  SpO2 100 % 01/25/2018  1:51 PM  Vitals shown include unvalidated device data.  Last Pain:  Vitals:   01/25/18 1245  TempSrc: Tympanic  PainSc:       Patients Stated Pain Goal: 0 (35/78/97 8478)  Complications: No apparent anesthesia complications

## 2018-01-25 NOTE — Progress Notes (Signed)
OT Cancellation Note  Patient Details Name: Tanya Nguyen MRN: [id number] DOB: [birth year]   Cancelled Treatment:    Reason Eval/Treat Not Completed: Patient at procedure or test/ unavailable. Pt out of the room for procedure. EGD noted in chart as scheduled for today. Will re-attempt at later date/time as pt is medically appropriate.   Jeni Salles, MPH, MS, OTR/L ascom [phone] 01/25/18, 1:21 PM

## 2018-01-25 NOTE — Progress Notes (Signed)
[facility] Progress Note  1 Day Post-Op  Subjective: Patient is resting in bed this morning. No acute events overnight. Studies to assess for anastomotic leak were unremarkable.   Objective: Vital signs in last 24 hours: Temp:  [98.3 F (36.8 C)-98.5 F (36.9 C)] 98.5 F (36.9 C) (11/20 0541) Pulse Rate:  [101-106] 104 (11/20 0541) Resp:  [18-20] 18 (11/20 0541) BP: (115-123)/(65-67) 120/65 (11/20 0541) SpO2:  [98 %-100 %] 99 % (11/20 0541) Weight:  [47.8 kg] 47.8 kg (11/20 0500) Last BM Date: 01/24/18  Intake/Output from previous day: 11/19 0701 - 11/20 0700 In: 1973.4 [I.V.:1773.4; IV Piggyback:200] Out: 2475 [Urine:1075; Emesis/NG output:900; Stool:500] Intake/Output this shift: No intake/output data recorded.  PE: Gen: Alert, NAD, pleasant HEENT: NGT in place with coffee-ground drainage in canister and tubing Pulm: Normal effort Abd: Soft,diffuse peri-incisional tenderness, non-distended,upper midline laparotomy incision is CDI, no erythema or drainage Skin: warm and dry, no rashes  Psych: A&Ox3    Lab Results:  Recent Labs    01/24/18 1757 01/25/18 0331  WBC 7.8 7.6  HGB 8.4* 7.5*  HCT 24.9* 22.7*  PLT 201 187   BMET Recent Labs    01/24/18 0410 01/25/18 0331  NA 139 139  K 3.8  3.8 3.8  CL 104 102  CO2 31 29  GLUCOSE 107* 124*  BUN 18 19  CREATININE 0.44 0.56  CALCIUM 7.8* 7.7*   PT/INR No results for input(s): LABPROT, INR in the last 72 hours. CMP     Component Value Date/Time   NA 139 01/25/2018 0331   NA 140 04/08/2014 1913   K 3.8 01/25/2018 0331   K 3.8 04/08/2014 1913   CL 102 01/25/2018 0331   CL 103 04/08/2014 1913   CO2 29 01/25/2018 0331   CO2 33 (H) 04/08/2014 1913   GLUCOSE 124 (H) 01/25/2018 0331   GLUCOSE 92 04/08/2014 1913   BUN 19 01/25/2018 0331   BUN 19 (H) 04/08/2014 1913   CREATININE 0.56 01/25/2018 0331   CREATININE 0.98 04/08/2014 1913   CALCIUM 7.7 (L) 01/25/2018 0331   CALCIUM 9.2  04/08/2014 1913   PROT 4.6 (L) 01/23/2018 0251   PROT 7.1 04/08/2014 1913   ALBUMIN 2.4 (L) 01/23/2018 0251   ALBUMIN 4.0 04/08/2014 1913   AST 16 01/23/2018 0251   AST 28 04/08/2014 1913   ALT 9 01/23/2018 0251   ALT 16 04/08/2014 1913   ALKPHOS 30 (L) 01/23/2018 0251   ALKPHOS 69 04/08/2014 1913   BILITOT 0.2 (L) 01/23/2018 0251   BILITOT 0.2 04/08/2014 1913   GFRNONAA >60 01/25/2018 0331   GFRNONAA >60 04/08/2014 1913   GFRAA >60 01/25/2018 0331   GFRAA >60 04/08/2014 1913   Lipase     Component Value Date/Time   LIPASE 40 01/19/2018 1424   LIPASE 167 04/08/2014 1913       Studies/Results: Ct Abdomen Pelvis W Contrast  Result Date: 01/24/2018 CLINICAL DATA:  Abdominal pain following gastrojejunal bypass due to duodenal obstruction EXAM: CT ABDOMEN AND PELVIS WITH CONTRAST TECHNIQUE: Multidetector CT imaging of the abdomen and pelvis was performed using the standard protocol following bolus administration of intravenous contrast. Oral contrast also administered. CONTRAST:  58mL OMNIPAQUE IOHEXOL 300 MG/ML SOLN, 86mL ISOVUE-300 IOPAMIDOL (ISOVUE-300) INJECTION 61% COMPARISON:  January 19, 2018 FINDINGS: Lower chest: There is a small left pleural effusion with atelectatic change in the left base. Hepatobiliary: There is a 1 cm cyst in the left lobe of the liver. Gallbladder is absent. There  is no appreciable biliary duct dilatation. Note that there is air in the biliary ductal system, likely of postoperative etiology. Pancreas: No pancreatic mass or inflammatory focus. Spleen: No splenic lesions are evident. Adrenals/Urinary Tract: Adrenals appear unremarkable bilaterally. Kidneys bilaterally show no evident mass or hydronephrosis on either side. There is no evident renal or ureteral calculus on either side. Urinary bladder is midline with wall thickness within normal limits. Urinary bladder is slightly distended currently. Stomach/Bowel: There is pneumoperitoneum which probably is  of postoperative etiology given the recent surgery. Stomach is moderately distended with fluid and air. Nasogastric tube tip is in the stomach. There is air and soft tissue material along the greater curvature of the stomach near the suture line, likely due to localized postoperative hematoma. There is soft tissue thickening in this area. Note that there is a degree of generalized wall thickening of the stomach, likely due to inflammation from recent obstruction and surgery. No active gastrointestinal bleeding site currently evident. There is an area of oral contrast posterior and inferior to the suture line along the greater curvature of the stomach, best seen on axial slices 33 and 34 series 2 as well as coronal slices 25 through 28 series 5. This finding is also appreciable on sagittal slice 61 series 6. A focal area of localized dehiscence along the greater curvature of the stomach posteriorly and inferiorly must be of concern. No similar changes are seen elsewhere. There is fluid throughout most of the small bowel. Note that there are mildly dilated loops of small and large bowel. Oral contrast is seen within the colon. There is air in the wall of the jejunum at the postoperative site which is felt to be due to postoperative change. There is no transition zone to suggest bowel obstruction. No portal venous air evident. Vascular/Lymphatic: There is no abdominal aortic aneurysm. No focal vascular lesions are evident. There is a retroaortic left renal vein, an anatomic variant. There is no adenopathy in abdomen or pelvis. Reproductive: Uterus is absent.  There is no pelvic mass. Other: There is no evident periappendiceal region inflammation. No abscess or appreciable ascites seen in the abdomen or pelvis. There is postoperative change in the anterior abdominal wall region with soft tissue thickening and mild hematoma in the periumbilical region. Musculoskeletal: No blastic or lytic bone lesions are evident. No  appreciable intramuscular lesions are apparent. IMPRESSION: 1. Evidence of gastrojejunal bypass grafting. Wall thickening as well as air and fluid noted throughout the stomach. Along the greater curvature near the postoperative site, there is probable hematoma with entrapped air. There is a suspected localized area of extraluminal oral contrast along the posteromedial aspect of the greater curvature, raising concern for a small focus of dehiscence. No similar changes elsewhere. No active gastrointestinal bleeding seen. 2. Foci of free intraperitoneal air are felt to be due to postoperative change. Air within the biliary ductal system is likely felt to be due to postoperative changes. 3. No bowel obstruction evident. There is fluid throughout the small bowel with mild generalized bowel dilatation, likely due to postoperative ileus. 4.  Small left pleural effusion with left base atelectasis. 5.  No renal or ureteral calculi.  No hydronephrosis. 6. Postoperative change anterior abdominal wall with mild hematoma formation in the periumbilical region. These results were called by telephone at the time of interpretation on 01/24/2018 at 2:27 pm to Dr. Olean Ree , who verbally acknowledged these results. Electronically Signed   By: Lowella Grip III M.D.   On:  01/24/2018 14:28   Dg Duanne Limerick W/o Kub  Result Date: 01/24/2018 CLINICAL DATA:  Evaluate for anastomosis leak. EXAM: WATER SOLUBLE UPPER GI SERIES TECHNIQUE: Single-column upper GI series was performed using water soluble contrast. CONTRAST:  Gastrografin COMPARISON:  CT 01/24/2018. FLUOROSCOPY TIME:  Fluoroscopy Time:  7 minutes 48 seconds Radiation Exposure Index (if provided by the fluoroscopic device): 128.3 mGy FINDINGS: Scout film reveals good positioning of NG tube. Contrast from prior CT noted over the colon. Gastrografin was administered through the NG tube without complication. Stomach and duodenum appeared normal. Gastric wall thickening noted at  the level of the gastro jejunal anastomosis, this is most likely postsurgical. Contrast its emptied into the digital loop. No leakage was demonstrated. Following the procedure the majority of the oral contrast was removed with the NG tube. IMPRESSION: No evidence of anastomotic leak. Electronically Signed   By: Marcello Moores  Register   On: 01/24/2018 16:43    Anti-infectives: Anti-infectives (From admission, onward)   None       Assessment/Plan  Gastric Outlet Obstruction Tanya Nguyen a65 y.o.femalewithacute blood lossanemia likely secondary to bleeding ulcer vs bleeding anastomosiswho is 3 days s/p open gastro-jejunal bypass for chronic NSAIDs-associated duodenal obstruction, complicated by pertinent comorbidities includingGERD with history of bleeding duodenal ulcer, hypothyroidism, migraine headaches, nephrolithiasis, and major depression disorder              - Hgb improved this morning (7.5), plan for EGD this afternoon with Dr. Vicente Males  - Recheck hemoglobin and hematocrit this afternoon -NPO,TPN,IVF   - Continue NGT to low intermittent wall suction - Monitor on going bowel function and abdominal examination - Pain control as needed (minimize narcotics)  - Mobilize as tolerated (as hgb improves), PT/OT following - Hold DVT prophylaxis given anemia, SCDs          -- Edison Simon , PA-C [facility] 01/25/2018, 8:57 AM [phone] M-F: 7am - 4pm

## 2018-01-25 NOTE — OR Nursing (Signed)
NG tube dc'd by Cristin Busick RN during procedure per Dr. Georgeann Oppenheim instructions.

## 2018-01-25 NOTE — Anesthesia Preprocedure Evaluation (Signed)
Anesthesia Evaluation  Patient identified by MRN, date of birth, ID band Patient awake    Reviewed: Allergy & Precautions, H&P , NPO status , reviewed documented beta blocker date and time   Airway Mallampati: III  TM Distance: >3 FB     Dental  (+) Chipped   Pulmonary    Pulmonary exam normal        Cardiovascular Normal cardiovascular exam     Neuro/Psych  Headaches, PSYCHIATRIC DISORDERS Anxiety Depression Bipolar Disorder    GI/Hepatic PUD, GERD  ,  Endo/Other  Hypothyroidism   Renal/GU Renal disease     Musculoskeletal   Abdominal   Peds  Hematology   Anesthesia Other Findings Past Medical History: No date: Bleeding duodenal ulcer No date: Depression No date: GERD (gastroesophageal reflux disease) No date: Hypothyroidism No date: Kidney stones No date: Migraine headache     Comment:  only1-2x/month since starting topamax  Past Surgical History: 2004: ABDOMINAL HYSTERECTOMY     Comment:  partial No date: CHOLECYSTECTOMY 01/12/2018: ESOPHAGOGASTRODUODENOSCOPY (EGD) WITH PROPOFOL; N/A     Comment:  Procedure: ESOPHAGOGASTRODUODENOSCOPY (EGD) WITH               BIOPSIES;  Surgeon: [name], MD;  Location: [place];  Service: Endoscopy;  Laterality: N/A; 01/20/2018: LAPAROTOMY; N/A     Comment:  Procedure: EXPLORATORY LAPAROTOMY;  Surgeon: Olean Ree, MD;  Location: [facility];  Service: General;                Laterality: N/A; No date: TOOTH EXTRACTION  BMI    Body Mass Index:  24.49 kg/m      Reproductive/Obstetrics                             Anesthesia Physical Anesthesia Plan  ASA: III  Anesthesia Plan: General   Post-op Pain Management:    Induction: Intravenous  PONV Risk Score and Plan: 3 and Treatment may vary due to age or medical condition and TIVA  Airway Management Planned: Nasal Cannula and Natural  Airway  Additional Equipment:   Intra-op Plan:   Post-operative Plan:   Informed Consent: I have reviewed the patients History and Physical, chart, labs and discussed the procedure including the risks, benefits and alternatives for the proposed anesthesia with the patient or authorized representative who has indicated his/her understanding and acceptance.   Dental Advisory Given  Plan Discussed with: CRNA  Anesthesia Plan Comments:         Anesthesia Quick Evaluation

## 2018-01-25 NOTE — H&P (Signed)
Jonathon Bellows, MD [address] [place], [place], [place], [address] [address], [place], [place], [place], [address] Phone: [phone]  Fax: [phone]  Primary Care Physician:  Pleas Koch, NP   Pre-Procedure History & Physical: HPI:  Tanya Nguyen is a 52 y.o. female is here for an endoscopy    Past Medical History:  Diagnosis Date  . Bleeding duodenal ulcer   . Depression   . GERD (gastroesophageal reflux disease)   . Hypothyroidism   . Kidney stones   . Migraine headache    only1-2x/month since starting topamax    Past Surgical History:  Procedure Laterality Date  . ABDOMINAL HYSTERECTOMY  2004   partial  . CHOLECYSTECTOMY    . ESOPHAGOGASTRODUODENOSCOPY (EGD) WITH PROPOFOL N/A 01/12/2018   Procedure: ESOPHAGOGASTRODUODENOSCOPY (EGD) WITH BIOPSIES;  Surgeon: [name], MD;  Location: [place];  Service: Endoscopy;  Laterality: N/A;  . LAPAROTOMY N/A 01/20/2018   Procedure: EXPLORATORY LAPAROTOMY;  Surgeon: Olean Ree, MD;  Location: [facility];  Service: General;  Laterality: N/A;  . TOOTH EXTRACTION      Prior to Admission medications   Medication Sig Start Date End Date Taking? Authorizing Provider  cetirizine (ZYRTEC) 10 MG tablet Take 10 mg by mouth daily as needed for allergies.    Yes [provider]  levothyroxine (SYNTHROID, LEVOTHROID) 100 MCG tablet Take 1 tablet by mouth every morning on an empty stomach with water. No food or other medications for 30 minutes. 12/21/17  Yes Pleas Koch, NP  pantoprazole (PROTONIX) 40 MG tablet Take 1 tablet (40 mg total) by mouth 2 (two) times daily. 01/13/18  Yes [name], MD  sucralfate (CARAFATE) 1 g tablet Take 1 tablet (1 g total) by mouth 3 (three) times daily before meals. 12/19/17  Yes Ria Bush, MD  SUMAtriptan (IMITREX) 25 MG tablet TAKE 1 TABLET EVERY 2 HOURS AS NEEDED FOR MIGRAINE, MAY REPEAT IN 2 HOURS IF HEADACHE PERSISTS OR RECURS 11/16/17  Yes Pleas Koch, NP  topiramate (TOPAMAX) 25 MG tablet Take 1 tablet by mouth at bedtime for migraine prevention. 12/06/17  Yes Pleas Koch, NP  traZODone (DESYREL) 150 MG tablet Take 1 tablet (150 mg total) by mouth at bedtime. 06/21/17  Yes Pleas Koch, NP  cyanocobalamin (,VITAMIN B-12,) 1000 MCG/ML injection Inject into the skin once monthly for 6 months for low vitamin B 12. Patient not taking: Reported on 01/09/2018 03/31/17   Pleas Koch, NP  gabapentin (NEURONTIN) 100 MG capsule Take 1 capsule (100 mg total) by mouth 3 (three) times daily. Patient not taking: Reported on 01/10/2018 09/21/17   Edrick Kins, DPM  Na Sulfate-K Sulfate-Mg Sulf (SUPREP BOWEL PREP KIT) 17.5-3.13-1.6 GM/177ML SOLN Take 1 kit by mouth as directed. 01/09/18   [name], MD  NONFORMULARY OR COMPOUNDED ITEM See pharmacy note Patient not taking: Reported on 01/10/2018 08/04/17   Edrick Kins, DPM  omeprazole (PRILOSEC) 40 MG capsule Take 1 capsule (40 mg total) by mouth every morning. Patient not taking: Reported on 01/19/2018 08/26/17   Copland, Frederico Hamman, MD  ondansetron (ZOFRAN) 4 MG tablet Take 1 tablet (4 mg total) by mouth every 8 (eight) hours as needed for nausea or vomiting. Patient not taking: Reported on 01/10/2018 12/19/17   Ria Bush, MD  ranitidine (ZANTAC) 150 MG tablet Take 1 tablet by mouth at bedtime Patient not taking: Reported on 01/19/2018 08/26/17   Owens Loffler, MD  sertraline (ZOLOFT) 50 MG  tablet TAKE 1 TABLET BY MOUTH ONCE A DAY Patient not taking: Reported on 01/19/2018 06/21/17   Pleas Koch, NP  SYRINGE-NEEDLE, DISP, 3 ML (B-D 3CC LUER-LOK SYR 25GX1") 25G X 1" 3 ML MISC Use as instructed to injection B12 every 30 days Patient not taking: Reported on 01/12/2018 03/31/17   Pleas Koch, NP    Allergies as of 01/19/2018 - Review Complete 01/19/2018  Allergen Reaction Noted  . Penicillins Shortness Of Breath, Diarrhea, and Nausea And Vomiting 02/26/2015  .  Sulfa antibiotics Nausea And Vomiting 06/26/2012    Family History  Problem Relation Age of Onset  . Cancer Mother   . Breast cancer Mother 56  . Cancer Father   . Alcohol abuse Sister   . Bipolar disorder Sister   . Alcohol abuse Maternal Grandmother   . Stroke Maternal Grandmother   . Stroke Paternal Grandmother     Social History   Socioeconomic History  . Marital status: Married    Spouse name: Not on file  . Number of children: Not on file  . Years of education: Not on file  . Highest education level: Not on file  Occupational History  . Not on file  Social Needs  . Financial resource strain: Not on file  . Food insecurity:    Worry: Not on file    Inability: Not on file  . Transportation needs:    Medical: Not on file    Non-medical: Not on file  Tobacco Use  . Smoking status: Never Smoker  . Smokeless tobacco: Never Used  Substance and Sexual Activity  . Alcohol use: Not Currently    Alcohol/week: 0.0 standard drinks  . Drug use: No  . Sexual activity: Yes    Birth control/protection: None  Lifestyle  . Physical activity:    Days per week: Not on file    Minutes per session: Not on file  . Stress: Not on file  Relationships  . Social connections:    Talks on phone: Not on file    Gets together: Not on file    Attends religious service: Not on file    Active member of club or organization: Not on file    Attends meetings of clubs or organizations: Not on file    Relationship status: Not on file  . Intimate partner violence:    Fear of current or ex partner: Not on file    Emotionally abused: Not on file    Physically abused: Not on file    Forced sexual activity: Not on file  Other Topics Concern  . Not on file  Social History Narrative  . Not on file    Review of Systems: See HPI, otherwise negative ROS  Physical Exam: BP 120/74 (BP Location: Left Arm)   Pulse 96   Temp 97.8 F (36.6 C) (Tympanic)   Resp 18   Ht 4' 7"  (1.397 m)   Wt  47.8 kg   SpO2 99%   BMI 24.49 kg/m  General:   Alert,  pleasant and cooperative in NAD Head:  Normocephalic and atraumatic. Neck:  Supple; no masses or thyromegaly. Lungs:  Clear throughout to auscultation, normal respiratory effort.    Heart:  +S1, +S2, Regular rate and rhythm, No edema. Abdomen:  Soft, mild generalized tenderness (much improved since yesterday)and nondistended. Normal bowel sounds, without guarding, and without rebound.   Neurologic:  Alert and  oriented x4;  grossly normal neurologically.  Impression/Plan: Tanya Nguyen is here for  an endoscopy  to be performed for  evaluation of GI bleed    Risks, benefits, limitations, and alternatives regarding endoscopy have been reviewed with the patient.  Questions have been answered.  All parties agreeable.   Jonathon Bellows, MD  01/25/2018, 1:21 PM

## 2018-01-25 NOTE — Anesthesia Post-op Follow-up Note (Signed)
Anesthesia QCDR form completed.        

## 2018-01-25 NOTE — Care Management (Signed)
MD wishes not to purse LTACH at this time.  If he determines LTACH is appropriate he will notify [facility]

## 2018-01-25 NOTE — Progress Notes (Signed)
PT Cancellation Note  Patient Details Name: Tanya Nguyen MRN: [id number] DOB: 04/30/1965   Cancelled Treatment:    Reason Eval/Treat Not Completed: Patient at procedure or test/unavailable.  Pt currently off floor for EGD.  Will continue to follow acutely.   Collie Siad PT, DPT 01/25/2018, 1:22 PM

## 2018-01-26 LAB — HEMOGLOBIN AND HEMATOCRIT, BLOOD
HCT: 26.7 % — ABNORMAL LOW (ref 36.0–46.0)
Hemoglobin: 8.6 g/dL — ABNORMAL LOW (ref 12.0–15.0)

## 2018-01-26 LAB — CBC
HCT: 23.2 % — ABNORMAL LOW (ref 36.0–46.0)
Hemoglobin: 7.6 g/dL — ABNORMAL LOW (ref 12.0–15.0)
MCH: 29.1 pg (ref 26.0–34.0)
MCHC: 32.8 g/dL (ref 30.0–36.0)
MCV: 88.9 fL (ref 80.0–100.0)
NRBC: 0 % (ref 0.0–0.2)
PLATELETS: 218 10*3/uL (ref 150–400)
RBC: 2.61 MIL/uL — AB (ref 3.87–5.11)
RDW: 14.1 % (ref 11.5–15.5)
WBC: 6 10*3/uL (ref 4.0–10.5)

## 2018-01-26 LAB — GLUCOSE, CAPILLARY
GLUCOSE-CAPILLARY: 108 mg/dL — AB (ref 70–99)
Glucose-Capillary: 111 mg/dL — ABNORMAL HIGH (ref 70–99)
Glucose-Capillary: 122 mg/dL — ABNORMAL HIGH (ref 70–99)
Glucose-Capillary: 123 mg/dL — ABNORMAL HIGH (ref 70–99)

## 2018-01-26 LAB — COMPREHENSIVE METABOLIC PANEL WITH GFR
ALT: 72 U/L — ABNORMAL HIGH (ref 0–44)
AST: 66 U/L — ABNORMAL HIGH (ref 15–41)
Albumin: 2.3 g/dL — ABNORMAL LOW (ref 3.5–5.0)
Alkaline Phosphatase: 121 U/L (ref 38–126)
Anion gap: 6 (ref 5–15)
BUN: 14 mg/dL (ref 6–20)
CO2: 31 mmol/L (ref 22–32)
Calcium: 7.9 mg/dL — ABNORMAL LOW (ref 8.9–10.3)
Chloride: 102 mmol/L (ref 98–111)
Creatinine, Ser: 0.52 mg/dL (ref 0.44–1.00)
GFR calc Af Amer: >60 mL/min
GFR calc non Af Amer: >60 mL/min
Glucose, Bld: 117 mg/dL — ABNORMAL HIGH (ref 70–99)
Potassium: 3.8 mmol/L (ref 3.5–5.1)
Sodium: 139 mmol/L (ref 135–145)
Total Bilirubin: 0.4 mg/dL (ref 0.3–1.2)
Total Protein: 5 g/dL — ABNORMAL LOW (ref 6.5–8.1)

## 2018-01-26 LAB — MAGNESIUM: MAGNESIUM: 1.8 mg/dL (ref 1.7–2.4)

## 2018-01-26 LAB — PHOSPHORUS: PHOSPHORUS: 4.8 mg/dL — AB (ref 2.5–4.6)

## 2018-01-26 MED ORDER — FAT EMULSION PLANT BASED 20 % IV EMUL
250.0000 mL | INTRAVENOUS | Status: AC
  Administered 2018-01-26: 250 mL via INTRAVENOUS
  Filled 2018-01-26: qty 250

## 2018-01-26 MED ORDER — SUMATRIPTAN SUCCINATE 6 MG/0.5ML ~~LOC~~ SOLN
6.0000 mg | Freq: Once | SUBCUTANEOUS | Status: AC
  Administered 2018-01-26: 6 mg via SUBCUTANEOUS
  Filled 2018-01-26: qty 0.5

## 2018-01-26 MED ORDER — SODIUM CHLORIDE 0.9 % IV SOLN
80.0000 mg | Freq: Two times a day (BID) | INTRAVENOUS | Status: DC
  Filled 2018-01-26 (×2): qty 80

## 2018-01-26 MED ORDER — SODIUM CHLORIDE 0.9 % IV SOLN
8.0000 mg/h | INTRAVENOUS | Status: AC
  Administered 2018-01-26 – 2018-01-29 (×6): 8 mg/h via INTRAVENOUS
  Filled 2018-01-26 (×6): qty 80

## 2018-01-26 MED ORDER — LORAZEPAM 2 MG/ML IJ SOLN
0.5000 mg | INTRAMUSCULAR | Status: DC | PRN
  Administered 2018-01-26 – 2018-01-30 (×12): 0.5 mg via INTRAVENOUS
  Filled 2018-01-26 (×12): qty 1

## 2018-01-26 MED ORDER — TRACE MINERALS CR-CU-MN-SE-ZN 10-1000-500-60 MCG/ML IV SOLN
INTRAVENOUS | Status: AC
  Administered 2018-01-26: 18:00:00 via INTRAVENOUS
  Filled 2018-01-26: qty 1440

## 2018-01-26 MED ORDER — HYDROMORPHONE HCL 1 MG/ML IJ SOLN
INTRAMUSCULAR | Status: AC
  Filled 2018-01-26: qty 1

## 2018-01-26 MED ORDER — BOOST / RESOURCE BREEZE PO LIQD CUSTOM
1.0000 | Freq: Three times a day (TID) | ORAL | Status: DC
  Administered 2018-01-26: 1 via ORAL

## 2018-01-26 MED ORDER — PROMETHAZINE HCL 25 MG/ML IJ SOLN
12.5000 mg | Freq: Four times a day (QID) | INTRAMUSCULAR | Status: DC | PRN
  Administered 2018-01-28 – 2018-01-29 (×6): 12.5 mg via INTRAVENOUS
  Filled 2018-01-26 (×6): qty 1

## 2018-01-26 NOTE — Progress Notes (Signed)
OT Cancellation Note  Patient Details Name: Tanya Nguyen MRN: [id number] DOB: 10-Jan-1966   Cancelled Treatment:    Reason Eval/Treat Not Completed: Pain limiting ability to participate. Chart reviewed. BP low and HR high. Spoke with RN who states pt is very pain limited this afternoon. Requests holding OT this pm and re-attempting in the morning. Will re-attempt 11/22 as medically appropriate.   Jeni Salles, MPH, MS, OTR/L ascom [phone] 01/26/18, 3:03 PM

## 2018-01-26 NOTE — Consult Note (Signed)
[facility] Consult   Reason for Consult: Consult for patient with history of mood disorder and anxiety recovering from stomach problems Referring Physician: Posey Pronto Patient Identification: Tanya Nguyen MRN:  [id number] Principal Diagnosis: Bipolar I disorder, most recent episode depressed ([place]) Diagnosis:  Principal Problem:   Bipolar I disorder, most recent episode depressed ([place]) Active Problems:   PTSD (post-traumatic stress disorder)   Gastric outlet obstruction   Protein-calorie malnutrition, severe   Acquired pyloric stricture   Total Time spent with patient: 20 minutes  Subjective:   Tanya Nguyen is a 52 y.o. female patient admitted with "I am still nervous".  HPI: Patient seen for follow-up.  She had an EGD which did not show an active bleed.  Treatment team has taken her off of suction and she is trying to increase her diet.  Patient told me that clear liquids today made her feel sick and she is panicking more.  Denies suicidal thoughts.  No evidence of psychosis.  Past Psychiatric History: Past history of recurrent episodes of anxiety and depression possible bipolar.  No history of suicide attempts  Risk to Self:   Risk to Others:   Prior Inpatient Therapy:   Prior Outpatient Therapy:    Past Medical History:  Past Medical History:  Diagnosis Date  . Bleeding duodenal ulcer   . Depression   . GERD (gastroesophageal reflux disease)   . Hypothyroidism   . Kidney stones   . Migraine headache    only1-2x/month since starting topamax    Past Surgical History:  Procedure Laterality Date  . ABDOMINAL HYSTERECTOMY  2004   partial  . CHOLECYSTECTOMY    . ESOPHAGOGASTRODUODENOSCOPY (EGD) WITH PROPOFOL N/A 01/12/2018   Procedure: ESOPHAGOGASTRODUODENOSCOPY (EGD) WITH BIOPSIES;  Surgeon: [name], MD;  Location: [facility];  Service: Endoscopy;  Laterality: N/A;  . LAPAROTOMY N/A 01/20/2018   Procedure: EXPLORATORY LAPAROTOMY;  Surgeon:  Olean Ree, MD;  Location: [facility];  Service: General;  Laterality: N/A;  . TOOTH EXTRACTION     Family History:  Family History  Problem Relation Age of Onset  . Cancer Mother   . Breast cancer Mother 86  . Cancer Father   . Alcohol abuse Sister   . Bipolar disorder Sister   . Alcohol abuse Maternal Grandmother   . Stroke Maternal Grandmother   . Stroke Paternal Grandmother    Family Psychiatric  History: See previous note Social History:  Social History   Substance and Sexual Activity  Alcohol Use Not Currently  . Alcohol/week: 0.0 standard drinks     Social History   Substance and Sexual Activity  Drug Use No    Social History   Socioeconomic History  . Marital status: Married    Spouse name: Not on file  . Number of children: Not on file  . Years of education: Not on file  . Highest education level: Not on file  Occupational History  . Not on file  Social Needs  . Financial resource strain: Not on file  . Food insecurity:    Worry: Not on file    Inability: Not on file  . Transportation needs:    Medical: Not on file    Non-medical: Not on file  Tobacco Use  . Smoking status: Never Smoker  . Smokeless tobacco: Never Used  Substance and Sexual Activity  . Alcohol use: Not Currently    Alcohol/week: 0.0 standard drinks  . Drug use: No  . Sexual activity: Yes    Birth  control/protection: None  Lifestyle  . Physical activity:    Days per week: Not on file    Minutes per session: Not on file  . Stress: Not on file  Relationships  . Social connections:    Talks on phone: Not on file    Gets together: Not on file    Attends religious service: Not on file    Active member of club or organization: Not on file    Attends meetings of clubs or organizations: Not on file    Relationship status: Not on file  Other Topics Concern  . Not on file  Social History Narrative  . Not on file   Additional Social History:    Allergies:   Allergies   Allergen Reactions  . Penicillins Shortness Of Breath, Diarrhea and Nausea And Vomiting    Has patient had a PCN reaction causing immediate rash, facial/tongue/throat swelling, SOB or lightheadedness with hypotension: yes Has patient had a PCN reaction causing severe rash involving mucus membranes or skin necrosis: no Has patient had a PCN reaction that required hospitalization no Has patient had a PCN reaction occurring within the last 10 years: about 10 years If all of the above answers are "NO", then may proceed with Cephalosporin use.   . Sulfa Antibiotics Nausea And Vomiting    Other reaction(s): VOMITING    Labs:  Results for orders placed or performed during the hospital encounter of 01/19/18 (from the past 48 hour(s))  CBC     Status: Abnormal   Collection Time: 01/24/18  5:57 PM  Result Value Ref Range   WBC 7.8 4.0 - 10.5 K/uL   RBC 2.89 (L) 3.87 - 5.11 MIL/uL   Hemoglobin 8.4 (L) 12.0 - 15.0 g/dL   HCT 24.9 (L) 36.0 - 46.0 %   MCV 86.2 80.0 - 100.0 fL   MCH 29.1 26.0 - 34.0 pg   MCHC 33.7 30.0 - 36.0 g/dL   RDW 14.6 11.5 - 15.5 %   Platelets 201 150 - 400 K/uL   nRBC 0.0 0.0 - 0.2 %    Comment: Performed at [facility], [address] [place], [place] [address]  Glucose, capillary     Status: None   Collection Time: 01/24/18  6:00 PM  Result Value Ref Range   Glucose-Capillary 99 70 - 99 mg/dL  Glucose, capillary     Status: Abnormal   Collection Time: 01/25/18 12:38 AM  Result Value Ref Range   Glucose-Capillary 109 (H) 70 - 99 mg/dL  CBC with Differential/Platelet     Status: Abnormal   Collection Time: 01/25/18  3:31 AM  Result Value Ref Range   WBC 7.6 4.0 - 10.5 K/uL   RBC 2.63 (L) 3.87 - 5.11 MIL/uL   Hemoglobin 7.5 (L) 12.0 - 15.0 g/dL   HCT 22.7 (L) 36.0 - 46.0 %   MCV 86.3 80.0 - 100.0 fL   MCH 28.5 26.0 - 34.0 pg   MCHC 33.0 30.0 - 36.0 g/dL   RDW 14.6 11.5 - 15.5 %   Platelets 187 150 - 400 K/uL   nRBC 0.0 0.0 - 0.2 %   Neutrophils  Relative % 72 %   Neutro Abs 5.6 1.7 - 7.7 K/uL   Lymphocytes Relative 10 %   Lymphs Abs 0.8 0.7 - 4.0 K/uL   Monocytes Relative 11 %   Monocytes Absolute 0.8 0.1 - 1.0 K/uL   Eosinophils Relative 5 %   Eosinophils Absolute 0.3 0.0 - 0.5 K/uL  Basophils Relative 1 %   Basophils Absolute 0.1 0.0 - 0.1 K/uL   Immature Granulocytes 1 %   Abs Immature Granulocytes 0.04 0.00 - 0.07 K/uL    Comment: Performed at [facility], [address] [place], [place] [address]  Basic metabolic panel     Status: Abnormal   Collection Time: 01/25/18  3:31 AM  Result Value Ref Range   Sodium 139 135 - 145 mmol/L   Potassium 3.8 3.5 - 5.1 mmol/L   Chloride 102 98 - 111 mmol/L   CO2 29 22 - 32 mmol/L   Glucose, Bld 124 (H) 70 - 99 mg/dL   BUN 19 6 - 20 mg/dL   Creatinine, Ser 0.56 0.44 - 1.00 mg/dL   Calcium 7.7 (L) 8.9 - 10.3 mg/dL   GFR calc non Af Amer >60 >60 mL/min   GFR calc Af Amer >60 >60 mL/min    Comment: (NOTE) The eGFR has been calculated using the CKD EPI equation. This calculation has not been validated in all clinical situations. eGFR's persistently <60 mL/min signify possible Chronic Kidney Disease.    Anion gap 8 5 - 15    Comment: Performed at [facility], [address] [place], [place] [address]  Glucose, capillary     Status: Abnormal   Collection Time: 01/25/18  5:34 AM  Result Value Ref Range   Glucose-Capillary 107 (H) 70 - 99 mg/dL  Glucose, capillary     Status: Abnormal   Collection Time: 01/25/18 11:38 AM  Result Value Ref Range   Glucose-Capillary 116 (H) 70 - 99 mg/dL  Hemoglobin and hematocrit, blood     Status: Abnormal   Collection Time: 01/25/18  3:01 PM  Result Value Ref Range   Hemoglobin 8.2 (L) 12.0 - 15.0 g/dL   HCT 25.2 (L) 36.0 - 46.0 %    Comment: Performed at [facility], [address] [place], [place] [address]  Glucose, capillary     Status: Abnormal   Collection Time: 01/25/18  5:35 PM  Result Value Ref  Range   Glucose-Capillary 118 (H) 70 - 99 mg/dL  Glucose, capillary     Status: Abnormal   Collection Time: 01/26/18 12:11 AM  Result Value Ref Range   Glucose-Capillary 111 (H) 70 - 99 mg/dL  Comprehensive metabolic panel     Status: Abnormal   Collection Time: 01/26/18  5:01 AM  Result Value Ref Range   Sodium 139 135 - 145 mmol/L   Potassium 3.8 3.5 - 5.1 mmol/L   Chloride 102 98 - 111 mmol/L   CO2 31 22 - 32 mmol/L   Glucose, Bld 117 (H) 70 - 99 mg/dL   BUN 14 6 - 20 mg/dL   Creatinine, Ser 0.52 0.44 - 1.00 mg/dL   Calcium 7.9 (L) 8.9 - 10.3 mg/dL   Total Protein 5.0 (L) 6.5 - 8.1 g/dL   Albumin 2.3 (L) 3.5 - 5.0 g/dL   AST 66 (H) 15 - 41 U/L   ALT 72 (H) 0 - 44 U/L   Alkaline Phosphatase 121 38 - 126 U/L   Total Bilirubin 0.4 0.3 - 1.2 mg/dL   GFR calc non Af Amer >60 >60 mL/min   GFR calc Af Amer >60 >60 mL/min    Comment: (NOTE) The eGFR has been calculated using the CKD EPI equation. This calculation has not been validated in all clinical situations. eGFR's persistently <60 mL/min signify possible Chronic Kidney Disease.    Anion gap 6  5 - 15    Comment: Performed at [facility], [address] [place], [place] [address]  Magnesium     Status: None   Collection Time: 01/26/18  5:01 AM  Result Value Ref Range   Magnesium 1.8 1.7 - 2.4 mg/dL    Comment: Performed at [facility], [address] [place], [place] [address]  Phosphorus     Status: Abnormal   Collection Time: 01/26/18  5:01 AM  Result Value Ref Range   Phosphorus 4.8 (H) 2.5 - 4.6 mg/dL    Comment: Performed at [facility], [address] [place], [place] [address]  CBC     Status: Abnormal   Collection Time: 01/26/18  5:01 AM  Result Value Ref Range   WBC 6.0 4.0 - 10.5 K/uL   RBC 2.61 (L) 3.87 - 5.11 MIL/uL   Hemoglobin 7.6 (L) 12.0 - 15.0 g/dL   HCT 23.2 (L) 36.0 - 46.0 %   MCV 88.9 80.0 - 100.0 fL   MCH 29.1 26.0 - 34.0 pg   MCHC 32.8 30.0 - 36.0 g/dL   RDW  14.1 11.5 - 15.5 %   Platelets 218 150 - 400 K/uL   nRBC 0.0 0.0 - 0.2 %    Comment: Performed at [facility], [address] [place], [place] [address]  Glucose, capillary     Status: Abnormal   Collection Time: 01/26/18  6:50 AM  Result Value Ref Range   Glucose-Capillary 122 (H) 70 - 99 mg/dL   Comment 1 QC Due   Glucose, capillary     Status: Abnormal   Collection Time: 01/26/18 11:34 AM  Result Value Ref Range   Glucose-Capillary 123 (H) 70 - 99 mg/dL  Hemoglobin and hematocrit, blood     Status: Abnormal   Collection Time: 01/26/18  2:44 PM  Result Value Ref Range   Hemoglobin 8.6 (L) 12.0 - 15.0 g/dL   HCT 26.7 (L) 36.0 - 46.0 %    Comment: Performed at [facility], [address] [place], [place] [address]    Current Facility-Administered Medications  Medication Dose Route Frequency Provider Last Rate Last Dose  . Marland KitchenTPN (CLINIMIX-E) Adult   Intravenous Continuous TPN Dallie Piles, RPH 60 mL/hr at 01/26/18 0410    . Marland KitchenTPN (CLINIMIX-E) Adult   Intravenous Continuous TPN Dallie Piles, [facility]      . Fat emulsion 20 % infusion 250 mL  250 mL Intravenous Continuous TPN Dallie Piles, RPH      . feeding supplement (BOOST / RESOURCE BREEZE) liquid 1 Container  1 Container Oral TID BM Tylene Fantasia, PA-C   1 Container at 01/26/18 1106  . HYDROmorphone (DILAUDID) injection 0.5 mg  0.5 mg Intravenous Q3H PRN Piscoya, Jose, MD   0.5 mg at 01/26/18 1453  . insulin aspart (novoLOG) injection 0-9 Units  0-9 Units Subcutaneous Q6H Olean Ree, MD   1 Units at 01/23/18 1814  . lactated ringers 1,000 mL with potassium chloride 20 mEq infusion   Intravenous Continuous Tylene Fantasia, PA-C 40 mL/hr at 01/26/18 0915    . levothyroxine (SYNTHROID, LEVOTHROID) injection 50 mcg  50 mcg Intravenous Daily Piscoya, Jose, MD   50 mcg at 01/26/18 0912  . LORazepam (ATIVAN) injection 0.5 mg  0.5 mg Intravenous Q4H PRN Piscoya, Jose, MD      . ondansetron (ZOFRAN-ODT)  disintegrating tablet 4 mg  4 mg Oral Q6H PRN Olean Ree, MD  Or  . ondansetron (ZOFRAN) injection 4 mg  4 mg Intravenous Q6H PRN Piscoya, Jose, MD   4 mg at 01/26/18 0405  . pantoprazole (PROTONIX) 80 mg in sodium chloride 0.9 % 250 mL (0.32 mg/mL) infusion  8 mg/hr Intravenous Continuous Piscoya, Jose, MD 25 mL/hr at 01/26/18 1237 8 mg/hr at 01/26/18 1237  . phenol (CHLORASEPTIC) mouth spray 1 spray  1 spray Mouth/Throat PRN Olean Ree, MD   1 spray at 01/22/18 2100  . promethazine (PHENERGAN) injection 12.5 mg  12.5 mg Intravenous Q6H PRN Piscoya, Jose, MD      . sodium chloride flush (NS) 0.9 % injection 10-40 mL  10-40 mL Intracatheter Q12H Piscoya, Jose, MD   10 mL at 01/25/18 2141  . sodium chloride flush (NS) 0.9 % injection 10-40 mL  10-40 mL Intracatheter PRN Olean Ree, MD   10 mL at 01/21/18 0745    Musculoskeletal: Strength & Muscle Tone: decreased Gait & Station: unable to stand Patient leans: N/A  Psychiatric Specialty Exam: Physical Exam  Nursing note and vitals reviewed. Constitutional: She appears well-developed and well-nourished.  HENT:  Head: Normocephalic and atraumatic.  Eyes: Pupils are equal, round, and reactive to light. Conjunctivae are normal.  Neck: Normal range of motion.  Cardiovascular: Regular rhythm and normal heart sounds.  Respiratory: Effort normal. No respiratory distress.  GI: Soft.  Musculoskeletal: Normal range of motion.  Neurological: She is alert.  Skin: Skin is warm and dry.  Psychiatric: Her mood appears anxious. Her speech is delayed. She is slowed. She expresses no suicidal ideation.    Review of Systems  Constitutional: Negative.   HENT: Negative.   Eyes: Negative.   Respiratory: Negative.   Cardiovascular: Negative.   Gastrointestinal: Positive for abdominal pain.  Musculoskeletal: Negative.   Skin: Negative.   Neurological: Negative.   Psychiatric/Behavioral: Negative for depression, hallucinations, substance  abuse and suicidal ideas. The patient is nervous/anxious.     Blood pressure (!) 99/49, pulse (!) 121, temperature 98.1 F (36.7 C), resp. rate (!) 21, height '4\' 7"'$  (1.397 m), weight 47.8 kg, SpO2 98 %.Body mass index is 24.49 kg/m.  General Appearance: Casual  Eye Contact:  Fair  Speech:  Blocked  Volume:  Decreased  Mood:  Anxious and Dysphoric  Affect:  Blunt  Thought Process:  Coherent  Orientation:  Full (Time, Place, and Person)  Thought Content:  Logical  Suicidal Thoughts:  No  Homicidal Thoughts:  No  Memory:  Immediate;   Fair Recent;   Fair Remote;   Fair  Judgement:  Fair  Insight:  Fair  Psychomotor Activity:  Decreased  Concentration:  Concentration: Fair  Recall:  [employer] of Knowledge:  Fair  Language:  Fair  Akathisia:  No  Handed:  Right  AIMS (if indicated):     Assets:  Desire for Improvement Housing  ADL's:  Impaired  Cognition:  Impaired,  Mild  Sleep:        Treatment Plan Summary: Daily contact with patient to assess and evaluate symptoms and progress in treatment, Medication management and Plan No change to current medication.  Continue as needed Ativan IV.  Patient still cannot swallow anything oral.  Cannot start any antidepressants yet at this point.  Encourage patient to work hard on physical therapy and tolerating oral intake.  I will continue to follow up as needed.  Disposition: No evidence of imminent risk to self or others at present.   Patient does not meet criteria for psychiatric inpatient  admission. Supportive therapy provided about ongoing stressors.  Alethia Berthold, MD 01/26/2018 5:55 PM

## 2018-01-26 NOTE — Progress Notes (Signed)
[name]  1 Day Post-Op  Subjective: No acute events overnight. He underwent EGD yesterday which showed anastomosis edema and no evidence of active bleeding. This morning, she reports improvement in her abdominal pain but continues to be sore and endorses associated nausea. Denied any emesis, fever, chills. She reports 1-2 small episodes of flatus and a BM. NGT was out yesterday during EGD.   Objective: Vital signs in last 24 hours: Temp:  [97 F (36.1 C)-99.4 F (37.4 C)] 99.4 F (37.4 C) (11/21 0436) Pulse Rate:  [96-118] 113 (11/21 0436) Resp:  [13-20] 20 (11/21 0436) BP: (113-132)/(64-74) 124/70 (11/21 0436) SpO2:  [97 %-100 %] 97 % (11/21 0436) Last BM Date: 01/25/18  Intake/Output from previous day: 11/20 0701 - 11/21 0700 In: 1473.1 [I.V.:1473.1] Out: 1500 [Urine:950; Emesis/NG output:200; Stool:350] Intake/Output this shift: No intake/output data recorded.  PE: Gen:  Alert, NAD, pleasant Pulm:  Normal effort Abd: Soft, peri-incisional tenderness which is improved, non-distended, midline laparoscopic incision is CDI without erythema or drainage Skin: warm and dry, no rashes  Psych: A&Ox3   Lab Results:  Recent Labs    01/25/18 0331 01/25/18 1501 01/26/18 0501  WBC 7.6  --  6.0  HGB 7.5* 8.2* 7.6*  HCT 22.7* 25.2* 23.2*  PLT 187  --  218   BMET Recent Labs    01/25/18 0331 01/26/18 0501  NA 139 139  K 3.8 3.8  CL 102 102  CO2 29 31  GLUCOSE 124* 117*  BUN 19 14  CREATININE 0.56 0.52  CALCIUM 7.7* 7.9*   PT/INR No results for input(s): LABPROT, INR in the last 72 hours. CMP     Component Value Date/Time   NA 139 01/26/2018 0501   NA 140 04/08/2014 1913   K 3.8 01/26/2018 0501   K 3.8 04/08/2014 1913   CL 102 01/26/2018 0501   CL 103 04/08/2014 1913   CO2 31 01/26/2018 0501   CO2 33 (H) 04/08/2014 1913   GLUCOSE 117 (H) 01/26/2018 0501   GLUCOSE 92 04/08/2014 1913   BUN 14 01/26/2018 0501   BUN 19 (H)  04/08/2014 1913   CREATININE 0.52 01/26/2018 0501   CREATININE 0.98 04/08/2014 1913   CALCIUM 7.9 (L) 01/26/2018 0501   CALCIUM 9.2 04/08/2014 1913   PROT 5.0 (L) 01/26/2018 0501   PROT 7.1 04/08/2014 1913   ALBUMIN 2.3 (L) 01/26/2018 0501   ALBUMIN 4.0 04/08/2014 1913   AST 66 (H) 01/26/2018 0501   AST 28 04/08/2014 1913   ALT 72 (H) 01/26/2018 0501   ALT 16 04/08/2014 1913   ALKPHOS 121 01/26/2018 0501   ALKPHOS 69 04/08/2014 1913   BILITOT 0.4 01/26/2018 0501   BILITOT 0.2 04/08/2014 1913   GFRNONAA >60 01/26/2018 0501   GFRNONAA >60 04/08/2014 1913   GFRAA >60 01/26/2018 0501   GFRAA >60 04/08/2014 1913   Lipase     Component Value Date/Time   LIPASE 40 01/19/2018 1424   LIPASE 167 04/08/2014 1913       Studies/Results: Ct Abdomen Pelvis W Contrast  Result Date: 01/24/2018 CLINICAL DATA:  Abdominal pain following gastrojejunal bypass due to duodenal obstruction EXAM: CT ABDOMEN AND PELVIS WITH CONTRAST TECHNIQUE: Multidetector CT imaging of the abdomen and pelvis was performed using the standard protocol following bolus administration of intravenous contrast. Oral contrast also administered. CONTRAST:  76mL OMNIPAQUE IOHEXOL 300 MG/ML SOLN, 26mL ISOVUE-300 IOPAMIDOL (ISOVUE-300) INJECTION 61% COMPARISON:  January 19, 2018 FINDINGS: Lower chest: There is a  small left pleural effusion with atelectatic change in the left base. Hepatobiliary: There is a 1 cm cyst in the left lobe of the liver. Gallbladder is absent. There is no appreciable biliary duct dilatation. Note that there is air in the biliary ductal system, likely of postoperative etiology. Pancreas: No pancreatic mass or inflammatory focus. Spleen: No splenic lesions are evident. Adrenals/Urinary Tract: Adrenals appear unremarkable bilaterally. Kidneys bilaterally show no evident mass or hydronephrosis on either side. There is no evident renal or ureteral calculus on either side. Urinary bladder is midline with wall  thickness within normal limits. Urinary bladder is slightly distended currently. Stomach/Bowel: There is pneumoperitoneum which probably is of postoperative etiology given the recent surgery. Stomach is moderately distended with fluid and air. Nasogastric tube tip is in the stomach. There is air and soft tissue material along the greater curvature of the stomach near the suture line, likely due to localized postoperative hematoma. There is soft tissue thickening in this area. Note that there is a degree of generalized wall thickening of the stomach, likely due to inflammation from recent obstruction and surgery. No active gastrointestinal bleeding site currently evident. There is an area of oral contrast posterior and inferior to the suture line along the greater curvature of the stomach, best seen on axial slices 33 and 34 series 2 as well as coronal slices 25 through 28 series 5. This finding is also appreciable on sagittal slice 61 series 6. A focal area of localized dehiscence along the greater curvature of the stomach posteriorly and inferiorly must be of concern. No similar changes are seen elsewhere. There is fluid throughout most of the small bowel. Note that there are mildly dilated loops of small and large bowel. Oral contrast is seen within the colon. There is air in the wall of the jejunum at the postoperative site which is felt to be due to postoperative change. There is no transition zone to suggest bowel obstruction. No portal venous air evident. Vascular/Lymphatic: There is no abdominal aortic aneurysm. No focal vascular lesions are evident. There is a retroaortic left renal vein, an anatomic variant. There is no adenopathy in abdomen or pelvis. Reproductive: Uterus is absent.  There is no pelvic mass. Other: There is no evident periappendiceal region inflammation. No abscess or appreciable ascites seen in the abdomen or pelvis. There is postoperative change in the anterior abdominal wall region with  soft tissue thickening and mild hematoma in the periumbilical region. Musculoskeletal: No blastic or lytic bone lesions are evident. No appreciable intramuscular lesions are apparent. IMPRESSION: 1. Evidence of gastrojejunal bypass grafting. Wall thickening as well as air and fluid noted throughout the stomach. Along the greater curvature near the postoperative site, there is probable hematoma with entrapped air. There is a suspected localized area of extraluminal oral contrast along the posteromedial aspect of the greater curvature, raising concern for a small focus of dehiscence. No similar changes elsewhere. No active gastrointestinal bleeding seen. 2. Foci of free intraperitoneal air are felt to be due to postoperative change. Air within the biliary ductal system is likely felt to be due to postoperative changes. 3. No bowel obstruction evident. There is fluid throughout the small bowel with mild generalized bowel dilatation, likely due to postoperative ileus. 4.  Small left pleural effusion with left base atelectasis. 5.  No renal or ureteral calculi.  No hydronephrosis. 6. Postoperative change anterior abdominal wall with mild hematoma formation in the periumbilical region. These results were called by telephone at the time of  interpretation on 01/24/2018 at 2:27 pm to Dr. Olean Ree , who verbally acknowledged these results. Electronically Signed   By: Lowella Grip III M.D.   On: 01/24/2018 14:28   Dg Duanne Limerick W/o Kub  Result Date: 01/24/2018 CLINICAL DATA:  Evaluate for anastomosis leak. EXAM: WATER SOLUBLE UPPER GI SERIES TECHNIQUE: Single-column upper GI series was performed using water soluble contrast. CONTRAST:  Gastrografin COMPARISON:  CT 01/24/2018. FLUOROSCOPY TIME:  Fluoroscopy Time:  7 minutes 48 seconds Radiation Exposure Index (if provided by the fluoroscopic device): 128.3 mGy FINDINGS: Scout film reveals good positioning of NG tube. Contrast from prior CT noted over the colon.  Gastrografin was administered through the NG tube without complication. Stomach and duodenum appeared normal. Gastric wall thickening noted at the level of the gastro jejunal anastomosis, this is most likely postsurgical. Contrast its emptied into the digital loop. No leakage was demonstrated. Following the procedure the majority of the oral contrast was removed with the NG tube. IMPRESSION: No evidence of anastomotic leak. Electronically Signed   By: Marcello Moores  Register   On: 01/24/2018 16:43    Anti-infectives: Anti-infectives (From admission, onward)   None       Assessment/Plan  Gastric Outlet Obstruction Tanya Nguyen a74 y.o.femalewithstable acute blood lossanemiawho is 6 days s/p open gastro-jejunal bypass for chronic NSAIDs-associated duodenal obstruction, complicated by pertinent comorbidities includingGERD with history of bleeding duodenal ulcer, hypothyroidism, migraine headaches, nephrolithiasis, and major depression disorder   - Advance to clear liquids + nutritional supplementation, IVF, TPN (will wean as diet advances) - Hgb stable this morning (7.6), Recheck hemoglobin and hematocrit this afternoon - Monitor on going bowel function and abdominal examination - Pain control as needed (minimize narcotics), Imitrex for headaches - Mobilize as tolerated (as hgb improves), PT/OT following - Hold DVT prophylaxis given anemia, SCDs  -- Edison Simon , PA-C  [facility] 01/26/2018, 8:18 AM [phone] M-F: 7am - 4pm

## 2018-01-26 NOTE — Consult Note (Signed)
PHARMACY - ADULT TOTAL PARENTERAL NUTRITION CONSULT NOTE   Pharmacy Consult for TPN management Indication: SBO  Patient Measurements: Height: 4\' 7"  (139.7 cm) Weight: 105 lb 6.1 oz (47.8 kg) IBW/kg (Calculated) : 34 TPN AdjBW (KG): 37.1 Body mass index is 24.49 kg/m.  Assessment: 52 year old female who presented to the ED on 11/14 with chronic abdominal pain. She had a PICC line placed yesterday, 11/14 with TPN to be started today after Ex-lap and gastrojejunostomy scheduled for this afternoon with Dr. Hampton Abbot. She has an NGT to low intermittent suction. No output recorded in I&O's at this time. She reports her UBW as 140 lbs and states that she last weighed this in June 2019 with a 40 lb weight loss over the course of 5 months and states that her most recent weight at her PCP was 102 lbs. This is a 20.5 lb weight loss since 07/28/17. (16.7% weight loss in less than 6 months).  GI: PPI Endo: SSI, on Synthroid 35mcg IV daily (last TSH 10/14 wnl) Insulin requirements in the past 24 hours: 0 units Lytes: wnl Renal: SCr<1  Pulm: Cards:  Hepatobil: Neuro: ID: WBC wnl, afebrile MRSA PCR (-) No current antibiotics TPN Access: 01/19/18 TPN start date: 01/20/18 Nutritional Goals (per RD recommendation on 01/20/18): KCal: 1022 Protein: 72g Fluid: 1475ml  Goal TPN rate is 60 ml/hr (provides 90% of patient needs)  Current Nutrition:   Plan:  -Continue E 5/15 TPN at 31mL/hr + 20% lipids at 65ml/hr for 12 hours This TPN provides 72 g of protein, 216 g of dextrose, and 36 g of lipids which provides 1022 kCals per day, meeting 90% of patient needs  -CLD started and plan to wean as she tolerates diet  -Electrolytes in TPN: E 5/15, no additional -Add MVI, trace elements  -sensitive q6h SSI and adjust as needed -LR20 IVMF at 40 ml/hr for a total of 2439ml/day -Monitor TPN labs, Mg, P and K  Monday and Thursday  -11/21: electrolytes wnl (phosphorous mildly elevated)  Dallie Piles,  PharmD 01/26/2018,12:11 PM

## 2018-01-26 NOTE — Progress Notes (Signed)
Tanya Nguyen , MD [address] [place], [place], [place], [address] [id number], [place], [place], [place], [address] Phone: [phone]  Fax: [phone]   Camarie Mctigue is being followed for GI bleed  Day 2 of follow up   Subjective: Feels better, less pain , tolerating PO   Objective: Vital signs in last 24 hours: Vitals:   01/25/18 1421 01/25/18 2006 01/26/18 0012 01/26/18 0436  BP: 132/70 120/69 113/68 124/70  Pulse: (!) 103 (!) 109 (!) 105 (!) 113  Resp: 15 18 18 20   Temp:  98.3 F (36.8 C) 99 F (37.2 C) 99.4 F (37.4 C)  TempSrc:  Oral Oral Oral  SpO2: 100% 100% 98% 97%  Weight:      Height:       Weight change:   Intake/Output Summary (Last 24 hours) at 01/26/2018 1120 Last data filed at 01/26/2018 0900 Gross per 24 hour  Intake 1833.11 ml  Output 1450 ml  Net 383.11 ml     Exam: Heart:: Regular rate and rhythm, S1S2 present or without murmur or extra heart sounds Lungs: normal, clear to auscultation and clear to auscultation and percussion Abdomen: soft , non distended, mild tenderness generalized(better since yuesterdays exam), BS+   Lab Results: @LABTEST2 @ Micro Results: Recent Results (from the past 240 hour(s))  MRSA PCR Screening     Status: None   Collection Time: 01/19/18  9:25 PM  Result Value Ref Range Status   MRSA by PCR NEGATIVE NEGATIVE Final    Comment:        The GeneXpert MRSA Assay (FDA approved for NASAL specimens only), is one component of a comprehensive MRSA colonization surveillance program. It is not intended to diagnose MRSA infection nor to guide or monitor treatment for MRSA infections. Performed at [facility], [address] [place], [place] [address]    Studies/Results: Ct Abdomen Pelvis W Contrast  Result Date: 01/24/2018 CLINICAL DATA:  Abdominal pain following gastrojejunal bypass due to duodenal obstruction EXAM: CT ABDOMEN AND PELVIS WITH CONTRAST TECHNIQUE: Multidetector CT imaging  of the abdomen and pelvis was performed using the standard protocol following bolus administration of intravenous contrast. Oral contrast also administered. CONTRAST:  30mL OMNIPAQUE IOHEXOL 300 MG/ML SOLN, 39mL ISOVUE-300 IOPAMIDOL (ISOVUE-300) INJECTION 61% COMPARISON:  January 19, 2018 FINDINGS: Lower chest: There is a small left pleural effusion with atelectatic change in the left base. Hepatobiliary: There is a 1 cm cyst in the left lobe of the liver. Gallbladder is absent. There is no appreciable biliary duct dilatation. Note that there is air in the biliary ductal system, likely of postoperative etiology. Pancreas: No pancreatic mass or inflammatory focus. Spleen: No splenic lesions are evident. Adrenals/Urinary Tract: Adrenals appear unremarkable bilaterally. Kidneys bilaterally show no evident mass or hydronephrosis on either side. There is no evident renal or ureteral calculus on either side. Urinary bladder is midline with wall thickness within normal limits. Urinary bladder is slightly distended currently. Stomach/Bowel: There is pneumoperitoneum which probably is of postoperative etiology given the recent surgery. Stomach is moderately distended with fluid and air. Nasogastric tube tip is in the stomach. There is air and soft tissue material along the greater curvature of the stomach near the suture line, likely due to localized postoperative hematoma. There is soft tissue thickening in this area. Note that there is a degree of generalized wall thickening of the stomach, likely due to inflammation from recent obstruction and surgery. No active gastrointestinal bleeding site currently evident. There is an  area of oral contrast posterior and inferior to the suture line along the greater curvature of the stomach, best seen on axial slices 33 and 34 series 2 as well as coronal slices 25 through 28 series 5. This finding is also appreciable on sagittal slice 61 series 6. A focal area of localized dehiscence  along the greater curvature of the stomach posteriorly and inferiorly must be of concern. No similar changes are seen elsewhere. There is fluid throughout most of the small bowel. Note that there are mildly dilated loops of small and large bowel. Oral contrast is seen within the colon. There is air in the wall of the jejunum at the postoperative site which is felt to be due to postoperative change. There is no transition zone to suggest bowel obstruction. No portal venous air evident. Vascular/Lymphatic: There is no abdominal aortic aneurysm. No focal vascular lesions are evident. There is a retroaortic left renal vein, an anatomic variant. There is no adenopathy in abdomen or pelvis. Reproductive: Uterus is absent.  There is no pelvic mass. Other: There is no evident periappendiceal region inflammation. No abscess or appreciable ascites seen in the abdomen or pelvis. There is postoperative change in the anterior abdominal wall region with soft tissue thickening and mild hematoma in the periumbilical region. Musculoskeletal: No blastic or lytic bone lesions are evident. No appreciable intramuscular lesions are apparent. IMPRESSION: 1. Evidence of gastrojejunal bypass grafting. Wall thickening as well as air and fluid noted throughout the stomach. Along the greater curvature near the postoperative site, there is probable hematoma with entrapped air. There is a suspected localized area of extraluminal oral contrast along the posteromedial aspect of the greater curvature, raising concern for a small focus of dehiscence. No similar changes elsewhere. No active gastrointestinal bleeding seen. 2. Foci of free intraperitoneal air are felt to be due to postoperative change. Air within the biliary ductal system is likely felt to be due to postoperative changes. 3. No bowel obstruction evident. There is fluid throughout the small bowel with mild generalized bowel dilatation, likely due to postoperative ileus. 4.  Small left  pleural effusion with left base atelectasis. 5.  No renal or ureteral calculi.  No hydronephrosis. 6. Postoperative change anterior abdominal wall with mild hematoma formation in the periumbilical region. These results were called by telephone at the time of interpretation on 01/24/2018 at 2:27 pm to Dr. Olean Ree , who verbally acknowledged these results. Electronically Signed   By: Lowella Grip III M.D.   On: 01/24/2018 14:28   Dg Duanne Limerick W/o Kub  Result Date: 01/24/2018 CLINICAL DATA:  Evaluate for anastomosis leak. EXAM: WATER SOLUBLE UPPER GI SERIES TECHNIQUE: Single-column upper GI series was performed using water soluble contrast. CONTRAST:  Gastrografin COMPARISON:  CT 01/24/2018. FLUOROSCOPY TIME:  Fluoroscopy Time:  7 minutes 48 seconds Radiation Exposure Index (if provided by the fluoroscopic device): 128.3 mGy FINDINGS: Scout film reveals good positioning of NG tube. Contrast from prior CT noted over the colon. Gastrografin was administered through the NG tube without complication. Stomach and duodenum appeared normal. Gastric wall thickening noted at the level of the gastro jejunal anastomosis, this is most likely postsurgical. Contrast its emptied into the digital loop. No leakage was demonstrated. Following the procedure the majority of the oral contrast was removed with the NG tube. IMPRESSION: No evidence of anastomotic leak. Electronically Signed   By: Marcello Moores  Register   On: 01/24/2018 16:43   Medications: I have reviewed the patient's current medications. Scheduled Meds: .  feeding supplement  1 Container Oral TID BM  . insulin aspart  0-9 Units Subcutaneous Q6H  . levothyroxine  50 mcg Intravenous Daily  . sodium chloride flush  10-40 mL Intracatheter Q12H   Continuous Infusions: . Marland KitchenTPN (CLINIMIX-E) Adult 60 mL/hr at 01/26/18 0410  . lactated ringers with kcl 40 mL/hr at 01/26/18 0915  . pantoprozole (PROTONIX) infusion     PRN Meds:.hydrALAZINE, HYDROmorphone (DILAUDID)  injection, LORazepam, ondansetron **OR** ondansetron (ZOFRAN) IV, phenol, promethazine, sodium chloride flush   Assessment: Principal Problem:   Bipolar I disorder, most recent episode depressed (HCC) Active Problems:   PTSD (post-traumatic stress disorder)   Gastric outlet obstruction   Protein-calorie malnutrition, severe   Acquired pyloric stricture  Jaidon Sponsel 52 y.o. female s/p gastrojejunostomy secondary to pyloric stenosis from likely NSAID related ulcer. S/p EGD 02/24/18 for coffee ground emesis which showed no active bleeding but a non bleeding superficial anastomotic ulcer with stenosis at the anastamosis . Doing well with PO diet . No further overt bleeding   Plan: 1. Continue PPI BID for 8 weeks- advance diet as tolerated - may need to stay on a liquid diet for a few days hoping edema at anastomosis resolves. IF needed in a few weeks if has symptoms of obstruction or gastric outlet delay then can consider dilation of the anastomosis.   I will sign off.  Please call me if any further GI concerns or questions.  We would like to thank you for the opportunity to participate in the care of Asheton Viramontes.    LOS: 7 days   Tanya Bellows, MD 01/26/2018, 11:20 AM

## 2018-01-26 NOTE — Anesthesia Postprocedure Evaluation (Signed)
Anesthesia Post Note  Patient: Tanya Nguyen  Procedure(s) Performed: EXPLORATORY LAPAROTOMY (N/A )  Patient location during evaluation: PACU Anesthesia Type: General Level of consciousness: awake and alert Pain management: pain level controlled Vital Signs Assessment: post-procedure vital signs reviewed and stable Respiratory status: spontaneous breathing, nonlabored ventilation, respiratory function stable and patient connected to nasal cannula oxygen Cardiovascular status: blood pressure returned to baseline and stable Postop Assessment: no apparent nausea or vomiting Anesthetic complications: no     Last Vitals:  Vitals:   01/26/18 0436 01/26/18 1339  BP: 124/70 (!) 99/49  Pulse: (!) 113 (!) 121  Resp: 20 (!) 21  Temp: 37.4 C 36.7 C  SpO2: 97% 98%    Last Pain:  Vitals:   01/26/18 1521  TempSrc:   PainSc: Turpin Adams

## 2018-01-26 NOTE — Progress Notes (Signed)
Physical Therapy Treatment Patient Details Name: Tanya Nguyen MRN: [id number] DOB: Jun 12, 1965 Today's Date: 01/26/2018    History of Present Illness presented to ER secondary to worsening abdominal pain; admitted for management of gastric outlet obstruction due to severly stenotic pyloris/duodenal bulb.  S/p exploratory laparotomy, gastrojejunostomy, jejunojejunostomy (11/15).  Pt with fluctuating Hgb during hospital stay and is s/p EGD.      PT Comments    Tanya Nguyen was limited by a headache and abdominal pain this session.  She requested for PT to assist her to [facility] to void.  She is impulsive and sad about her circumstance but not tearful.  Pt requires cues for safe management of RW with stand pivot transfer and ambulated 30 ft with RW, remaining steady throughout.  HR elevated with ambulation (see general comments below).  Pt with +dizziness and +orthostatic from supine>sit with BP improving with session progression (see general comments below).  Pt clarified home setup and says that her back entrance has no steps but there is a gravel path to get to this entrance.  She has 3 steps at the front with a paved pathway to get to these steps with Bil rails.  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  Encouraged pt to mobilize with nursing as frequently as she is able to maintain strength during hospital stay. Pt will benefit from continued skilled PT services to increase functional independence and safety.   Follow Up Recommendations  Home health PT     Equipment Recommendations  Rolling walker with 5" wheels    Recommendations for Other Services       Precautions / Restrictions Precautions Precautions: Fall;Other (comment) Precaution Comments: monitor HR, BP Restrictions Weight Bearing Restrictions: No    Mobility  Bed Mobility Overal bed  mobility: Needs Assistance Bed Mobility: Supine to Sit;Sit to Supine     Supine to sit: Supervision;HOB elevated Sit to supine: Supervision   General bed mobility comments: Supervision for safety as pt with drop in BP from supine to sit.  Pt performs without physical assist or cues. Pt reports dizziness sitting EOB.   Transfers Overall transfer level: Needs assistance Equipment used: Rolling walker (2 wheeled) Transfers: Sit to/from [facility] Sit to Stand: Min guard Stand pivot transfers: Min guard       General transfer comment: Min guard for safety and cues for proper management of RW as she releases RW prematurely when pivoting to [facility].  Pt remains steady with use of RW.   Ambulation/Gait Ambulation/Gait assistance: Min guard Gait Distance (Feet): 30 Feet Assistive device: Rolling walker (2 wheeled) Gait Pattern/deviations: Decreased step length - right;Decreased step length - left Gait velocity: decreased   General Gait Details: Pt with decreased gait speed but remains steady using RW.  HR elevated with ambulation (see general comments below).  Pt declined farther ambulation this date due to headache and abdominal pain.     Stairs             Wheelchair Mobility    Modified Rankin (Stroke Patients Only)       Balance Overall balance assessment: Needs assistance Sitting-balance support: No upper extremity supported;Feet supported Sitting balance-Leahy Scale: Good     Standing balance support: Single extremity supported;During functional activity Standing balance-Leahy Scale: Fair Standing balance comment: Pt able to stand statically to wash hands without UE support  but uses RW for support when ambulating.                             Cognition Arousal/Alertness: Awake/alert Behavior During Therapy: Impulsive(sad but not tearful) Overall Cognitive Status: Within Functional Limits for tasks assessed                                         Exercises Other Exercises Other Exercises: Encouraged pt to ambulate in room and/or hallway with nursing staff each time the pt gets OOB to void    General Comments General comments (skin integrity, edema, etc.): Vitals taken during session.  In supine at start of session: BP 133/69, pulse 126, SpO2 98%.  Sitting EOB: BP 111/72, pulse 125, SpO2 98%.  Standing: pulse 129, SpO2 98%.  Ambualtion: pulse 135, SpO2 98%.  Sitting EOB: pulse 126, SpO2 98%.  Supine: BP 123/68, pulse 119, SpO2 99%.        Pertinent Vitals/Pain Pain Assessment: Faces Faces Pain Scale: Hurts even more Pain Location: headache, abdomen Pain Descriptors / Indicators: Aching;Moaning Pain Intervention(s): Limited activity within patient's tolerance;Monitored during session;Repositioned;Patient requesting pain meds-RN notified(pt notified that RN does not have pain medication for pt)    Home Living                      Prior Function            PT Goals (current goals can now be found in the care plan section) Acute Rehab PT Goals Patient Stated Goal: decreased pain PT Goal Formulation: With patient Time For Goal Achievement: 02/05/18 Potential to Achieve Goals: Good Progress towards PT goals: Progressing toward goals    Frequency    Min 2X/week      PT Plan Current plan remains appropriate    Co-evaluation              AM-PAC PT "6 Clicks" Daily Activity  Outcome Measure  Difficulty turning over in bed (including adjusting bedclothes, sheets and blankets)?: A Little Difficulty moving from lying on back to sitting on the side of the bed? : A Little Difficulty sitting down on and standing up from a chair with arms (e.g., wheelchair, bedside commode, etc,.)?: A Lot Help needed moving to and from a bed to chair (including a wheelchair)?: A Little Help needed walking in hospital room?: A Little Help needed climbing 3-5 steps with a railing? : A Little 6 Click  Score: 17    End of Session Equipment Utilized During Treatment: Gait belt Activity Tolerance: Patient limited by fatigue;Patient limited by pain;Treatment limited secondary to medical complications (Comment)(Elevated HR) Patient left: in bed;with call bell/phone within reach;with bed alarm set;with family/visitor present Nurse Communication: Mobility status;Patient requests pain meds PT Visit Diagnosis: Unsteadiness on feet (R26.81);Muscle weakness (generalized) (M62.81);Difficulty in walking, not elsewhere classified (R26.2)     Time: 0922-0949 PT Time Calculation (min) (ACUTE ONLY): 27 min  Charges:  $Gait Training: 8-22 mins $Therapeutic Activity: 8-22 mins                    Session was performed by student PT, Belva Crome, and directed, overseen, and documented by this PT.  Collie Siad PT, DPT  01/26/2018, 10:40 AM

## 2018-01-26 NOTE — Progress Notes (Signed)
OT Cancellation Note  Patient Details Name: Tanya Nguyen MRN: [id number] DOB: June 08, 1965   Cancelled Treatment:    Reason Eval/Treat Not Completed: Other (comment). Chart reviewed. Upon attempt to see for OT evaluation, nursing with pt administering ativan. Introduced role of OT. Will re-attempt this afternoon as medically appropriate.   Jeni Salles, MPH, MS, OTR/L ascom [phone] 01/26/18, 11:09 AM

## 2018-01-27 ENCOUNTER — Encounter: Payer: Self-pay | Admitting: Gastroenterology

## 2018-01-27 LAB — GLUCOSE, CAPILLARY
GLUCOSE-CAPILLARY: 115 mg/dL — AB (ref 70–99)
GLUCOSE-CAPILLARY: 118 mg/dL — AB (ref 70–99)
GLUCOSE-CAPILLARY: 131 mg/dL — AB (ref 70–99)
Glucose-Capillary: 107 mg/dL — ABNORMAL HIGH (ref 70–99)
Glucose-Capillary: 115 mg/dL — ABNORMAL HIGH (ref 70–99)

## 2018-01-27 LAB — HEMOGLOBIN AND HEMATOCRIT, BLOOD
HEMATOCRIT: 24.3 % — AB (ref 36.0–46.0)
Hemoglobin: 7.8 g/dL — ABNORMAL LOW (ref 12.0–15.0)

## 2018-01-27 MED ORDER — SUMATRIPTAN SUCCINATE 50 MG PO TABS
25.0000 mg | ORAL_TABLET | Freq: Once | ORAL | Status: AC
  Administered 2018-01-27: 25 mg via ORAL
  Filled 2018-01-27: qty 1

## 2018-01-27 MED ORDER — SUMATRIPTAN SUCCINATE 50 MG PO TABS
25.0000 mg | ORAL_TABLET | Freq: Four times a day (QID) | ORAL | Status: DC | PRN
  Administered 2018-01-27 – 2018-01-30 (×4): 25 mg via ORAL
  Filled 2018-01-27 (×5): qty 1

## 2018-01-27 MED ORDER — ENSURE ENLIVE PO LIQD
237.0000 mL | Freq: Two times a day (BID) | ORAL | Status: DC
  Administered 2018-01-28 – 2018-01-29 (×3): 237 mL via ORAL

## 2018-01-27 MED ORDER — TOPIRAMATE 25 MG PO TABS
25.0000 mg | ORAL_TABLET | Freq: Every day | ORAL | Status: DC
  Administered 2018-01-28 – 2018-01-29 (×2): 25 mg via ORAL
  Filled 2018-01-27 (×4): qty 1

## 2018-01-27 MED ORDER — GABAPENTIN 100 MG PO CAPS
100.0000 mg | ORAL_CAPSULE | Freq: Three times a day (TID) | ORAL | Status: DC
  Administered 2018-01-28 (×2): 100 mg via ORAL
  Filled 2018-01-27 (×9): qty 1

## 2018-01-27 MED ORDER — FAT EMULSION PLANT BASED 20 % IV EMUL
180.0000 mL | INTRAVENOUS | Status: AC
  Administered 2018-01-27: 180 mL via INTRAVENOUS
  Filled 2018-01-27: qty 180

## 2018-01-27 MED ORDER — TRACE MINERALS CR-CU-MN-SE-ZN 10-1000-500-60 MCG/ML IV SOLN
INTRAVENOUS | Status: AC
  Administered 2018-01-27: 18:00:00 via INTRAVENOUS
  Filled 2018-01-27: qty 960

## 2018-01-27 MED ORDER — OXYCODONE HCL 5 MG PO TABS
5.0000 mg | ORAL_TABLET | Freq: Four times a day (QID) | ORAL | Status: DC | PRN
  Administered 2018-01-27 (×2): 5 mg via ORAL
  Administered 2018-01-28 – 2018-01-30 (×7): 10 mg via ORAL
  Filled 2018-01-27 (×2): qty 2
  Filled 2018-01-27: qty 1
  Filled 2018-01-27: qty 2
  Filled 2018-01-27: qty 1
  Filled 2018-01-27 (×6): qty 2

## 2018-01-27 MED ORDER — LEVOTHYROXINE SODIUM 100 MCG PO TABS
100.0000 ug | ORAL_TABLET | Freq: Every day | ORAL | Status: DC
  Administered 2018-01-27 – 2018-01-30 (×4): 100 ug via ORAL
  Filled 2018-01-27 (×4): qty 1

## 2018-01-27 MED ORDER — ACETAMINOPHEN 325 MG PO TABS: 650.0000 mg | ORAL_TABLET | Freq: Four times a day (QID) | ORAL | Status: DC | PRN

## 2018-01-27 MED ORDER — SERTRALINE HCL 50 MG PO TABS
50.0000 mg | ORAL_TABLET | Freq: Every day | ORAL | Status: DC
  Administered 2018-01-27 – 2018-01-30 (×4): 50 mg via ORAL
  Filled 2018-01-27 (×4): qty 1

## 2018-01-27 NOTE — Progress Notes (Signed)
Nutrition Follow Up Note   DOCUMENTATION CODES:   Severe malnutrition in context of chronic illness  INTERVENTION:    Decrease Clinimix 5/15 with electrolytes to 40 ml/hr- Plan to discontinue 11/23 if pt tolerating diet and if ok with surgery  Continue 20% lipids _0 /hr x 12 hrs    Continue MVI daily   Continue trace elements daily  - Daily weights   - IVF rate per MD  NUTRITION DIAGNOSIS:   Severe Malnutrition related to chronic illness (gastric outlet obstruction, chronic abdominal pain) as evidenced by moderate fat depletion, severe muscle depletion, energy intake < or equal to 75% for > or equal to 1 month, percent weight loss (16.7% weight loss in less than 6 months).  GOAL:   Patient will meet greater than or equal to 90% of their needs  -met with TPN  MONITOR:   I & O's, Skin, Weight trends, Labs, Diet advancement, Other (Comment)(TPN)  ASSESSMENT:   52 year old female who presented to the ED on 11/14 with chronic abdominal pain. PMH significant for gastric outlet obstruction and recent EGD showing significant food bezoar in the stomach with marked distension and pyloric stenosis, pyloric edema, and ulceration. PMH also significant for bipolar 1 disorder, depression, GERD, hypothyroidism, and a history of bleeding duodenal ulcers.   Pt now s/p open gastro-jejunal bypass for chronic NSAIDs-associated duodenal obstruction  Pt tolerating clear liquids; advanced to full liquids today. Plan to begin to wean TPN. On Ensure. Can possibly discontinue TPN tomorrow if ok with surgery. Labs ok. Per chart, pt's weight back down to her admit weight. Monitor CBGs.   Medications reviewed and include: insulin, synthroid, KCl, LRS 20m/hr, protonix, hydromorphone   Labs reviewed: K 3.8 wnl, P 4.8(H), Mg 1.8 wnl Triglycerides- 35- 11/15 Hgb 7.8(L), Hct 24.3(L) cbgs- 115, 131 x 24 hrs  Diet Order:   Diet Order            Diet full liquid Room service appropriate? Yes; Fluid  consistency: Thin  Diet effective now             EDUCATION NEEDS:   Education needs have been addressed  Skin:  Incision abdomen   Last BM:  11/22- type 6  Height:   Ht Readings from Last 1 Encounters:  01/19/18 _1  (1.397 m)    Weight:   Wt Readings from Last 1 Encounters:  01/27/18 46.5 kg    Ideal Body Weight:  34.09 kg  BMI:  Body mass index is 23.82 kg/m.  Estimated Nutritional Needs:   Kcal:  1300-1500kcal/day   Protein:  60-70g/day   Fluid:  >1.3L/day   CKoleen DistanceMS, RD, LDN Pager #- [phone]#- [phone] Hours Pager: [phone]

## 2018-01-27 NOTE — Evaluation (Signed)
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  S/p exploratory laparotomy, gastrojejunostomy, jejunojejunostomy (11/15).  Pt with fluctuating Hgb during hospital stay and is s/p EGD.     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  S/p exploratory laparotomy, gastrojejunostomy, jejunojejunostomy (11/15).  Pt with fluctuating Hgb during hospital stay and is s/p EGD. Hgb today is 7.8 with pain levels decreased to 2/10 and improved interest in eating today.  Pt currently requires min assist for ADLs due to pain in abdomen, decreased endurance and balance for functional tasks and at risk for falls.  Pt would benefit from skilled OT services to increase independence in ADLs, education in energy conservation techniques, HEP to increase strength and stamina and recommendations for home modifications to increase safety and prevent falls.  Rec OT HH after discharge.     Follow Up Recommendations  Home health OT    Equipment Recommendations       Recommendations for Other Services       Precautions / Restrictions Precautions Precautions: Fall;Other (comment) Precaution Comments: monitor HR, BP Restrictions Weight Bearing Restrictions: No      Mobility Bed Mobility                  Transfers                      Balance                                           ADL either performed or assessed with clinical judgement   ADL Overall ADL's : Needs assistance/impaired Eating/Feeding: Independent;Set up   Grooming: Wash/dry  hands;Wash/dry face;Oral care;Applying deodorant;Brushing hair;Set up;Supervision/safety   Upper Body Bathing: Set up;Supervision/ safety;Minimal assistance Upper Body Bathing Details (indicate cue type and reason): mainly due to decreased endurance and weakness in BUEs  Lower Body Bathing: Minimal assistance;Set up   Upper Body Dressing : Independent;Set up   Lower Body Dressing: Minimal assistance;Set up;Sit to/from stand Lower Body Dressing Details (indicate cue type and reason): decreased balance when leaning forward sitting EOB with mild pain  Toilet Transfer: Min guard;Set up;RW;Stand-pivot   Toileting- Clothing Manipulation and Hygiene: Supervision/safety;Set up       Functional mobility during ADLs: Min guard;Set up;Rolling walker       Vision Baseline Vision/History: Wears glasses Wears Glasses: Reading only Patient Visual Report: No change from baseline       Perception     Praxis      Pertinent Vitals/Pain Pain Assessment: 0-10 Faces Pain Scale: Hurts a little bit Pain Location: abdomen Pain Descriptors / Indicators: Discomfort;Dull Pain Intervention(s): Limited activity within patient's tolerance;Monitored during session;Repositioned;Relaxation     Hand Dominance Right   Extremity/Trunk Assessment Upper Extremity Assessment Upper Extremity Assessment: Generalized weakness   Lower Extremity Assessment Lower Extremity Assessment: Defer to PT evaluation       Communication Communication Communication: No difficulties   Cognition Arousal/Alertness: Awake/alert Behavior During Therapy: WFL for tasks assessed/performed Overall Cognitive Status: Within Functional Limits for tasks assessed  General Comments: Pt more engaged in session and not impulsive for ambulation to sink to brush teeth and then to sit in recliner with alarm activated. Pain decreased to 2/10 and had an appetite today.   General Comments        Exercises     Shoulder Instructions      Home Living Family/patient expects to be discharged to:: Private residence Living Arrangements: Spouse/significant other Available Help at Discharge: Family Type of Home: House Home Access: Stairs to enter [employer] of Steps: 3 front, no rails; 0 steps back entrance   Home Layout: One level     Bathroom Shower/Tub: Teacher, early years/pre: Standard     Home Equipment: None          Prior Functioning/Environment Level of Independence: Independent        Comments: Indep with ADLs, household and community mobilization without assist device; caregiver for mother within the home (assist with ADLs, household repsonsibilities)        OT Problem List: Decreased strength;Decreased activity tolerance;Decreased knowledge of use of DME or AE;Pain;Impaired balance (sitting and/or standing)      OT Treatment/Interventions: Self-care/ADL training;Energy conservation;DME and/or AE instruction;Therapeutic activities;Therapeutic exercise;Patient/family education    OT Goals(Current goals can be found in the care plan section) Acute Rehab OT Goals Patient Stated Goal: to be abl to take care of myself again since I was taking care of my mother too before OT Goal Formulation: With patient/family Time For Goal Achievement: 02/10/18 Potential to Achieve Goals: Good ADL Goals Pt Will Perform Lower Body Dressing: with supervision;with set-up;sit to/from stand Pt Will Transfer to Toilet: with set-up;with supervision;regular height toilet;stand pivot transfer Pt/caregiver will Perform Home Exercise Program: Both right and left upper extremity;With written HEP provided;With theraband;With theraputty  OT Frequency: Min 1X/week   Barriers to D/C:            Co-evaluation              AM-PAC PT "6 Clicks" Daily Activity     Outcome Measure Help from another person eating meals?: None Help from another person  taking care of personal grooming?: None Help from another person toileting, which includes using toliet, bedpan, or urinal?: A Little Help from another person bathing (including washing, rinsing, drying)?: A Little Help from another person to put on and taking off regular upper body clothing?: None Help from another person to put on and taking off regular lower body clothing?: A Little 6 Click Score: 21   End of Session Equipment Utilized During Treatment: Gait belt  Activity Tolerance: Patient tolerated treatment well Patient left: in chair;with call bell/phone within reach;with chair alarm set;with family/visitor present  OT Visit Diagnosis: Muscle weakness (generalized) (M62.81);Pain Pain - Right/Left: (middle of abdomen)                Time: 5035-4656 OT Time Calculation (min): 43 min Charges:  OT General Charges $OT Visit: 1 Visit OT Evaluation $OT Eval Low Complexity: 1 Low OT Treatments $Self Care/Home Management : 23-37 mins  Chrys Racer, OTR/L ascom [phone] 01/27/18, 10:14 AM

## 2018-01-27 NOTE — Progress Notes (Signed)
Pt agitated with care. Pt had called out reporting pain. Task nurse, Rodman Key sent in to tell the pt that none of her PRNs were due at that time except for her Tylenol. While Rodman Key was walking to the room pt called out again asking to go to the bathroom. Rodman Key offered pt the Tylenol but pt said no reporting that it was like taking candy. Pt refused to allow Rodman Key to toileted pt because he was a man. Pts assigned tech sent in shortly after to help pt. Pt then called out again reporting a migraine and the only thing that would help would be imitrex. RN messaged Dr. Hampton Abbot and received order for imitrex q 6 PRN. Order placed by RN. RN went in to alert pt that order had been placed. Pt very agitated saying she was going to report nurse and the Network engineer. RN explained that the order had to be obtained and as soon as medication was received from the pharmacy it would be administered. Pt then said she had already reported "two black girls and would still be reporting RN." Pt also reported that "I'm gonna call my husband up here and he's going to be even madder than me." RN administered imitrex once it was tubed up from the pharmacy. Pt refused to communicate with RN when asked if there was anything else that she needed. RN told pt that she would be leaving the room and to please call if she needed anything else. Will continue to monitor and make hourly rounding on pt.

## 2018-01-27 NOTE — Consult Note (Addendum)
PHARMACY - ADULT TOTAL PARENTERAL NUTRITION CONSULT NOTE   Pharmacy Consult for TPN management Indication: SBO  Patient Measurements: Height: 4\' 7"  (139.7 cm) Weight: 102 lb 8 oz (46.5 kg) IBW/kg (Calculated) : 34 TPN AdjBW (KG): 37.1 Body mass index is 23.82 kg/m.  Assessment: 52 year old female who presented to the ED on 11/14 with chronic abdominal pain. She had a PICC line placed yesterday, 11/14 with TPN to be started today after Ex-lap and gastrojejunostomy scheduled for this afternoon with Dr. Hampton Abbot. She has an NGT to low intermittent suction. No output recorded in I&O's at this time. She reports her UBW as 140 lbs and states that she last weighed this in June 2019 with a 40 lb weight loss over the course of 5 months and states that her most recent weight at her PCP was 102 lbs. This is a 20.5 lb weight loss since 07/28/17. (16.7% weight loss in less than 6 months).  GI: PPI Endo: SSI, on Synthroid 34mcg IV daily (last TSH 10/14 wnl) Insulin requirements in the past 24 hours: 1 units Lytes: 11/21- wnl Renal: SCr<1  Pulm: Cards:  Hepatobil: Neuro: ID: WBC wnl, afebrile MRSA PCR (-) No current antibiotics TPN Access: 01/19/18 TPN start date: 01/20/18 Nutritional Goals (per RD recommendation on 01/20/18): KCal: 1022 Protein: 72g Fluid: 1463ml  Goal TPN rate is 60 ml/hr (provides 90% of patient needs)  Current Nutrition:   Plan:  -Continue E 5/15 TPN and decrease rate to 8mL/hr per discussion with Dietician + 20% lipids at 31ml/hr for 12 hours  This TPN provides 72 g of protein, 216 g of dextrose, and 36 g of lipids which provides 1022 kCals per day, meeting 90% of patient needs  -CLD tolerated, plan advance to full liquids and wean TPN -Electrolytes in TPN: E 5/15, no additional -Add MVI, trace elements  -sensitive q6h SSI and adjust as needed -IVF-   LRw/ KCL20 meq IVMF at 40 ml/hr for a total of 1922ml/day with TPN -Monitor TPN labs, Mg, P and K  Monday and  Thursday  -11/21: electrolytes wnl (phosphorous mildly elevated), next scheduled electrolytes are Monday 11/25 unless need to be reassessed before then.  Tanya Nguyen A, PharmD 01/27/2018,8:38 AM

## 2018-01-27 NOTE — Progress Notes (Signed)
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  S/p exploratory laparotomy, gastrojejunostomy, jejunojejunostomy (11/15).  Pt with fluctuating Hgb during hospital stay and is s/p EGD.      PT Comments    Tanya Nguyen was agreeable to work with therapy and was feeling much better today.  Pt's BP dropped significantly with sit>stand and was instructed to return to supine quickly with RN notified.  Therefore, session limited.  Follow up recommendations remain appropriate.   Vitals monitored during session. Supine at rest: BP 117/59, HR 105.  Seated EOB: BP 92/76, HR 103.  After sitting EOB for ~4 minutes: BP 111/47, HR 102.  Standing: BP 83/67, HR 114.  Pt instructed to return to supine.  Once pt supine, RN was called to notify of BP readings who then presented to room.  Supine: BP 109/59, HR 97.  Supine again after several minutes: BP 114/58, HR 92.      Follow Up Recommendations  Home health PT     Equipment Recommendations  Rolling walker with 5" wheels    Recommendations for Other Services       Precautions / Restrictions Precautions Precautions: Fall;Other (comment) Precaution Comments: monitor HR, BP Restrictions Weight Bearing Restrictions: No    Mobility  Bed Mobility Overal bed mobility: Needs Assistance Bed Mobility: Supine to Sit;Sit to Supine     Supine to sit: Supervision;HOB elevated Sit to supine: Supervision   General bed mobility comments: Supervision for safety.  Pt performs without physical assist or cues. Pt denies dizziness with supine>sit this date.   Transfers Overall transfer level: Needs assistance Equipment used: Rolling walker (2 wheeled) Transfers: Sit to/from Stand Sit to Stand: Min guard         General transfer comment: Min guard for safety.  Pt remains steady with use of RW.   Pt with +orthostatic hypotension and was instructed to return to supine.  (see general comments below)  Ambulation/Gait             General Gait Details: Not safe to attempt at this time due to +orthostatic hypotension   Stairs             Wheelchair Mobility    Modified Rankin (Stroke Patients Only)       Balance Overall balance assessment: Needs assistance Sitting-balance support: No upper extremity supported;Feet supported Sitting balance-Leahy Scale: Good     Standing balance support: Single extremity supported;During functional activity Standing balance-Leahy Scale: Fair Standing balance comment: Pt able to stand statically without UE support but would likely lose her balance with perturbation                            Cognition Arousal/Alertness: Awake/alert Behavior During Therapy: WFL for tasks assessed/performed Overall Cognitive Status: No family/caregiver present to determine baseline cognitive functioning                                        Exercises Other Exercises Other Exercises: Encouraged pt to ambulate in room and/or hallway with nursing staff at least 3x/day    General Comments General comments (skin integrity, edema, etc.): Vitals monitored during session. Supine at rest:  BP 117/59, HR 105.  Seated EOB: BP 92/76, HR 103.  After sitting EOB for ~4 minutes: BP 111/47, HR 102.  Standing: BP 83/67, HR 114.  Pt instructed to return to supine.  Once pt supine, RN was called to notify of BP readings who then presented to room.  Supine: BP 109/59, HR 97.  Supine again after several minutes: BP 114/58, HR 92.        Pertinent Vitals/Pain Pain Assessment: Faces Faces Pain Scale: Hurts a little bit Pain Location: abdomen Pain Descriptors / Indicators: Aching Pain Intervention(s): Limited activity within patient's  tolerance;Monitored during session    Home Living                      Prior Function            PT Goals (current goals can now be found in the care plan section) Acute Rehab PT Goals Patient Stated Goal: to feel better PT Goal Formulation: With patient Time For Goal Achievement: 02/05/18 Potential to Achieve Goals: Good Progress towards PT goals: Not progressing toward goals - comment(due to orthostatic hypotension)    Frequency    Min 2X/week      PT Plan Current plan remains appropriate    Co-evaluation              AM-PAC PT "6 Clicks" Daily Activity  Outcome Measure  Difficulty turning over in bed (including adjusting bedclothes, sheets and blankets)?: A Little Difficulty moving from lying on back to sitting on the side of the bed? : A Little Difficulty sitting down on and standing up from a chair with arms (e.g., wheelchair, bedside commode, etc,.)?: A Little Help needed moving to and from a bed to chair (including a wheelchair)?: A Little Help needed walking in hospital room?: A Little Help needed climbing 3-5 steps with a railing? : A Little 6 Click Score: 18    End of Session Equipment Utilized During Treatment: Gait belt Activity Tolerance: Treatment limited secondary to medical complications (Comment)(due to orthostatic hypotension) Patient left: in bed;with call bell/phone within reach;with bed alarm set(pt instructed to remain in supine for ~30 minutes) Nurse Communication: Mobility status;Other (comment)(BP readings) PT Visit Diagnosis: Unsteadiness on feet (R26.81);Muscle weakness (generalized) (M62.81);Difficulty in walking, not elsewhere classified (R26.2)     Time: 1453-1520 PT Time Calculation (min) (ACUTE ONLY): 27 min  Charges:  $Therapeutic Activity: 23-37 mins                     Session was performed by student PT, Belva Crome, and directed, overseen, and documented by this PT.  Collie Siad PT, DPT 01/27/2018,  3:37 PM

## 2018-01-27 NOTE — Progress Notes (Signed)
[facility] Progress Note   Subjective:  No acute events overnight. She reports incisional soreness but her pain is getting much better. She denied any fever, chills, nausea, or emesis. She tolerated a clear liquid diet yesterday. Mobilizing with PT.  Objective: Vital signs in last 24 hours: Temp:  [97.8 F (36.6 C)-98.1 F (36.7 C)] 97.8 F (36.6 C) (11/22 0520) Pulse Rate:  [99-121] 99 (11/22 0520) Resp:  [16-21] 16 (11/22 0520) BP: (99-114)/(49-58) 111/58 (11/22 0520) SpO2:  [96 %-98 %] 98 % (11/22 0520) Weight:  [46.5 kg] 46.5 kg (11/22 0520) Last BM Date: 01/26/18  Intake/Output from previous day: 11/21 0701 - 11/22 0700 In: 2347.8 [P.O.:360; I.V.:1987.8] Out: 1475 [Urine:1475] Intake/Output this shift: No intake/output data recorded.  PE: Gen:  Alert, NAD, pleasant Pulm:  Normal effort Abd: Soft, peri-incisional soreness which is improved, non-distended, midline laparoscopic incision is CDI without erythema or drainage Skin: warm and dry, no rashes  Psych: A&Ox3   Lab Results:  Recent Labs    01/25/18 0331  01/26/18 0501 01/26/18 1444 01/27/18 0501  WBC 7.6  --  6.0  --   --   HGB 7.5*   < > 7.6* 8.6* 7.8*  HCT 22.7*   < > 23.2* 26.7* 24.3*  PLT 187  --  218  --   --    < > = values in this interval not displayed.   BMET Recent Labs    01/25/18 0331 01/26/18 0501  NA 139 139  K 3.8 3.8  CL 102 102  CO2 29 31  GLUCOSE 124* 117*  BUN 19 14  CREATININE 0.56 0.52  CALCIUM 7.7* 7.9*   PT/INR No results for input(s): LABPROT, INR in the last 72 hours. CMP     Component Value Date/Time   NA 139 01/26/2018 0501   NA 140 04/08/2014 1913   K 3.8 01/26/2018 0501   K 3.8 04/08/2014 1913   CL 102 01/26/2018 0501   CL 103 04/08/2014 1913   CO2 31 01/26/2018 0501   CO2 33 (H) 04/08/2014 1913   GLUCOSE 117 (H) 01/26/2018 0501   GLUCOSE 92 04/08/2014 1913   BUN 14 01/26/2018 0501   BUN 19 (H) 04/08/2014 1913   CREATININE 0.52  01/26/2018 0501   CREATININE 0.98 04/08/2014 1913   CALCIUM 7.9 (L) 01/26/2018 0501   CALCIUM 9.2 04/08/2014 1913   PROT 5.0 (L) 01/26/2018 0501   PROT 7.1 04/08/2014 1913   ALBUMIN 2.3 (L) 01/26/2018 0501   ALBUMIN 4.0 04/08/2014 1913   AST 66 (H) 01/26/2018 0501   AST 28 04/08/2014 1913   ALT 72 (H) 01/26/2018 0501   ALT 16 04/08/2014 1913   ALKPHOS 121 01/26/2018 0501   ALKPHOS 69 04/08/2014 1913   BILITOT 0.4 01/26/2018 0501   BILITOT 0.2 04/08/2014 1913   GFRNONAA >60 01/26/2018 0501   GFRNONAA >60 04/08/2014 1913   GFRAA >60 01/26/2018 0501   GFRAA >60 04/08/2014 1913   Lipase     Component Value Date/Time   LIPASE 40 01/19/2018 1424   LIPASE 167 04/08/2014 1913       Studies/Results: No results found.  Anti-infectives: Anti-infectives (From admission, onward)   None       Assessment/Plan  Gastric Outlet Obstruction Tanya Nguyen a27 y.o.femalewithstable acute blood lossanemiawho is otherwise doing well 7 days s/p open gastro-jejunal bypass for chronic NSAIDs-associated duodenal obstruction, complicated by pertinent comorbidities includingGERD with history of bleeding duodenal ulcer, hypothyroidism, migraine headaches, nephrolithiasis, and major depression  disorder              - Advance to full liquids + nutritional supplementation, if continues to do well can advance further tomorrow  - Begin to wean TPN  - Continue IVF for now, will wean -Hgb stable this morning (7.8), Hgb stable for 3 days now - Monitor on going bowel function and abdominal examination - Pain control as needed (minimize narcotics), Imitrex for headaches, transition to PO medications  - Restart home medications  - Mobilize as tolerated, PT/OT following recommending HHPT - Hold DVT prophylaxis given anemia, SCDs   Edison Simon , PA-C [facility] 01/27/2018, 8:35 AM [phone] M-F: 7am -  4pm

## 2018-01-28 LAB — GLUCOSE, CAPILLARY
Glucose-Capillary: 115 mg/dL — ABNORMAL HIGH (ref 70–99)
Glucose-Capillary: 108 mg/dL — ABNORMAL HIGH (ref 70–99)

## 2018-01-28 MED ORDER — FAT EMULSION PLANT BASED 20 % IV EMUL
180.0000 mL | INTRAVENOUS | Status: DC
  Administered 2018-01-28: 180 mL via INTRAVENOUS
  Filled 2018-01-28: qty 100

## 2018-01-28 MED ORDER — TRACE MINERALS CR-CU-MN-SE-ZN 10-1000-500-60 MCG/ML IV SOLN
INTRAVENOUS | Status: DC
  Administered 2018-01-28: 19:00:00 via INTRAVENOUS
  Filled 2018-01-28: qty 480

## 2018-01-28 NOTE — Consult Note (Signed)
PHARMACY - ADULT TOTAL PARENTERAL NUTRITION CONSULT NOTE   Pharmacy Consult for TPN management Indication: SBO  Patient Measurements: Height: 4\' 7"  (139.7 cm) Weight: 97 lb (44 kg) IBW/kg (Calculated) : 34 TPN AdjBW (KG): 37.1 Body mass index is 22.54 kg/m.  Assessment: 52 year old female who presented to the ED on 11/14 with chronic abdominal pain. She had a PICC line placed yesterday, 11/14 with TPN to be started today after Ex-lap and gastrojejunostomy scheduled for this afternoon with Dr. Hampton Abbot. She has an NGT to low intermittent suction. No output recorded in I&O's at this time. She reports her UBW as 140 lbs and states that she last weighed this in June 2019 with a 40 lb weight loss over the course of 5 months and states that her most recent weight at her PCP was 102 lbs. This is a 20.5 lb weight loss since 07/28/17. (16.7% weight loss in less than 6 months).  GI: PPI Endo: SSI, on Synthroid 81mcg IV daily (last TSH 10/14 wnl) Insulin requirements in the past 24 hours: 0 units Lytes: 11/21- wnl Renal: SCr<1  Pulm: Cards:  Hepatobil: Neuro: ID: WBC wnl, afebrile MRSA PCR (-) No current antibiotics TPN Access: 01/19/18 TPN start date: 01/20/18 Nutritional Goals (per RD recommendation on 01/20/18): KCal: 1022 Protein: 72g Fluid: 1439ml  Goal TPN rate is 60 ml/hr (provides 90% of patient needs)  Current Nutrition:   Plan:  -Continue E 5/15 TPN and decrease rate to 20 mL/hr per Surgery + 20% lipids at 17ml/hr for 12 hours  This TPN provides 72 g of protein, 216 g of dextrose, and 36 g of lipids which provides 1022 kCals per day, meeting 90% of patient needs  -advance to soft diet and wean TPN -Electrolytes in TPN: E 5/15, no additional -Add MVI, trace elements  -sensitive q6h SSI and adjust as needed  -IVF-   LRw/ KCL20 meq IVMF at 40 ml/hr -  Monitor TPN labs, Mg, P and K  Monday and Thursday  next scheduled electrolytes are Monday 11/25 unless need to be  reassessed before then.  Venisa Frampton A, PharmD 01/28/2018,12:26 PM

## 2018-01-28 NOTE — Progress Notes (Signed)
[facility] Progress Note   Subjective:  No acute events overnight. She reports incisional soreness but her pain is getting much better. She denied any fever, chills, nausea, or emesis. She tolerated a clear liquid diet yesterday but states that everything she tries that is more substantial causes her pain and nausea. Mobilizing with PT.  Objective: Vital signs in last 24 hours: Temp:  [97.8 F (36.6 C)-98.3 F (36.8 C)] 98.3 F (36.8 C) (11/23 0452) Pulse Rate:  [90-106] 90 (11/23 0452) Resp:  [16-18] 18 (11/23 0452) BP: (113-125)/(44-72) 113/44 (11/23 0452) SpO2:  [98 %-99 %] 98 % (11/23 0452) Weight:  [44 kg-45.7 kg] 44 kg (11/23 0441) Last BM Date: 01/27/18  Intake/Output from previous day: 11/22 0701 - 11/23 0700 In: 3708.5 [P.O.:990; I.V.:2718.5] Out: 275 [Urine:275] Intake/Output this shift: Total I/O In: 625.5 [I.V.:625.5] Out: -   PE: Gen:  Alert, NAD, pleasant Pulm:  Normal effort Abd: Soft, peri-incisional soreness which is improved, non-distended, midline laparoscopic incision is CDI without erythema or drainage Skin: warm and dry, no rashes  Psych: A&Ox3   Lab Results:  Recent Labs    01/26/18 0501 01/26/18 1444 01/27/18 0501  WBC 6.0  --   --   HGB 7.6* 8.6* 7.8*  HCT 23.2* 26.7* 24.3*  PLT 218  --   --    BMET Recent Labs    01/26/18 0501  NA 139  K 3.8  CL 102  CO2 31  GLUCOSE 117*  BUN 14  CREATININE 0.52  CALCIUM 7.9*   PT/INR No results for input(s): LABPROT, INR in the last 72 hours. CMP     Component Value Date/Time   NA 139 01/26/2018 0501   NA 140 04/08/2014 1913   K 3.8 01/26/2018 0501   K 3.8 04/08/2014 1913   CL 102 01/26/2018 0501   CL 103 04/08/2014 1913   CO2 31 01/26/2018 0501   CO2 33 (H) 04/08/2014 1913   GLUCOSE 117 (H) 01/26/2018 0501   GLUCOSE 92 04/08/2014 1913   BUN 14 01/26/2018 0501   BUN 19 (H) 04/08/2014 1913   CREATININE 0.52 01/26/2018 0501   CREATININE 0.98 04/08/2014 1913   CALCIUM 7.9 (L) 01/26/2018 0501   CALCIUM 9.2 04/08/2014 1913   PROT 5.0 (L) 01/26/2018 0501   PROT 7.1 04/08/2014 1913   ALBUMIN 2.3 (L) 01/26/2018 0501   ALBUMIN 4.0 04/08/2014 1913   AST 66 (H) 01/26/2018 0501   AST 28 04/08/2014 1913   ALT 72 (H) 01/26/2018 0501   ALT 16 04/08/2014 1913   ALKPHOS 121 01/26/2018 0501   ALKPHOS 69 04/08/2014 1913   BILITOT 0.4 01/26/2018 0501   BILITOT 0.2 04/08/2014 1913   GFRNONAA >60 01/26/2018 0501   GFRNONAA >60 04/08/2014 1913   GFRAA >60 01/26/2018 0501   GFRAA >60 04/08/2014 1913   Lipase     Component Value Date/Time   LIPASE 40 01/19/2018 1424   LIPASE 167 04/08/2014 1913       Studies/Results: No results found.  Anti-infectives: Anti-infectives (From admission, onward)   None       Assessment/Plan  Gastric Outlet Obstruction Tanya Nguyen 8 days s/p open gastro-jejunal bypass for chronic NSAIDs-associated duodenal obstruction, complicated by pertinent comorbidities includingGERD with history of bleeding duodenal ulcer, hypothyroidism, migraine headaches, nephrolithiasis, and major depression disorder              - Advance to soft diet; the items offered for  full liquids do not appeal to her.  - Begin to wean TPN--cut to 1/2 today with plans for d/c   - Continue IVF for now, due to poor PO intake -Hgb not checked this AM, but has been stable for prior 3 days. - Monitor on going bowel function and abdominal examination - Pain control as needed (minimize narcotics), Imitrex for headaches, transition to PO medications  - Restart home medications  - Mobilize as tolerated, PT/OT following recommending HHPT - Hold DVT prophylaxis given anemia, SCDs   Fredirick Maudlin  [facility] 01/28/2018, 12:01 PM

## 2018-01-29 LAB — HEMOGLOBIN AND HEMATOCRIT, BLOOD
HCT: 25 % — ABNORMAL LOW (ref 36.0–46.0)
HEMOGLOBIN: 8 g/dL — AB (ref 12.0–15.0)

## 2018-01-29 LAB — GLUCOSE, CAPILLARY
GLUCOSE-CAPILLARY: 100 mg/dL — AB (ref 70–99)
GLUCOSE-CAPILLARY: 119 mg/dL — AB (ref 70–99)
Glucose-Capillary: 113 mg/dL — ABNORMAL HIGH (ref 70–99)
Glucose-Capillary: 114 mg/dL — ABNORMAL HIGH (ref 70–99)

## 2018-01-29 LAB — TRIGLYCERIDES: Triglycerides: 40 mg/dL (ref <150)

## 2018-01-29 MED ORDER — TRACE MINERALS CR-CU-MN-SE-ZN 10-1000-500-60 MCG/ML IV SOLN: INTRAVENOUS | Status: DC

## 2018-01-29 MED ORDER — MORPHINE SULFATE (PF) 2 MG/ML IV SOLN
2.0000 mg | Freq: Once | INTRAVENOUS | Status: AC
  Administered 2018-01-29: 2 mg via INTRAVENOUS
  Filled 2018-01-29: qty 1

## 2018-01-29 NOTE — Progress Notes (Signed)
[facility] Progress Note   Subjective:  No acute events overnight. She is in much better spirits today and reports tolerating more solid food.  TPN was cut in half yesterday. Mildly tachycardic and hypotensive, but without symptoms. Mobilizing with PT.  Objective: Vital signs in last 24 hours: Temp:  [98 F (36.7 C)-98.5 F (36.9 C)] 98 F (36.7 C) (11/24 1202) Pulse Rate:  [104-113] 113 (11/24 1202) Resp:  [16-20] 20 (11/24 1202) BP: (88-109)/(50-67) 100/50 (11/24 1229) SpO2:  [95 %-98 %] 98 % (11/24 1202) Weight:  [45.6 kg] 45.6 kg (11/24 0448) Last BM Date: 01/28/18  Intake/Output from previous day: 11/23 0701 - 11/24 0700 In: 2503 [P.O.:357; I.V.:2146] Out: 600 [Urine:600] Intake/Output this shift: Total I/O In: 322.1 [I.V.:322.1] Out: -   PE: Gen:  Alert, NAD, pleasant Pulm:  Normal effort Abd: Soft, peri-incisional soreness which is improved, non-distended, midline laparoscopic incision is CDI without erythema or drainage Skin: warm and dry, no rashes  Psych: A&Ox3   Lab Results:  Recent Labs    01/27/18 0501 01/29/18 0506  HGB 7.8* 8.0*  HCT 24.3* 25.0*   BMET No results for input(s): NA, K, CL, CO2, GLUCOSE, BUN, CREATININE, CALCIUM in the last 72 hours. PT/INR No results for input(s): LABPROT, INR in the last 72 hours. CMP     Component Value Date/Time   NA 139 01/26/2018 0501   NA 140 04/08/2014 1913   K 3.8 01/26/2018 0501   K 3.8 04/08/2014 1913   CL 102 01/26/2018 0501   CL 103 04/08/2014 1913   CO2 31 01/26/2018 0501   CO2 33 (H) 04/08/2014 1913   GLUCOSE 117 (H) 01/26/2018 0501   GLUCOSE 92 04/08/2014 1913   BUN 14 01/26/2018 0501   BUN 19 (H) 04/08/2014 1913   CREATININE 0.52 01/26/2018 0501   CREATININE 0.98 04/08/2014 1913   CALCIUM 7.9 (L) 01/26/2018 0501   CALCIUM 9.2 04/08/2014 1913   PROT 5.0 (L) 01/26/2018 0501   PROT 7.1 04/08/2014 1913   ALBUMIN 2.3 (L) 01/26/2018 0501   ALBUMIN 4.0 04/08/2014 1913   AST 66 (H) 01/26/2018 0501   AST 28 04/08/2014 1913   ALT 72 (H) 01/26/2018 0501   ALT 16 04/08/2014 1913   ALKPHOS 121 01/26/2018 0501   ALKPHOS 69 04/08/2014 1913   BILITOT 0.4 01/26/2018 0501   BILITOT 0.2 04/08/2014 1913   GFRNONAA >60 01/26/2018 0501   GFRNONAA >60 04/08/2014 1913   GFRAA >60 01/26/2018 0501   GFRAA >60 04/08/2014 1913   Lipase     Component Value Date/Time   LIPASE 40 01/19/2018 1424   LIPASE 167 04/08/2014 1913       Studies/Results: No results found.  Anti-infectives: Anti-infectives (From admission, onward)   None       Assessment/Plan  Gastric Outlet Obstruction Tanya Nguyen a5 y.o.femalewithstable acute blood lossanemiawho is otherwise doing well 9 days s/p open gastro-jejunal bypass for chronic NSAIDs-associated duodenal obstruction, complicated by pertinent comorbidities includingGERD with history of bleeding duodenal ulcer, hypothyroidism, migraine headaches, nephrolithiasis, and major depression disorder              - Continue soft diet  - finish today's TPN and then d/c  - Continue IVF for now, for fluid balance -Hgb checked this AM due to tachycardia and low BP, but it is stable; do not suspect bleeding at this time.. - Monitor on going bowel function and abdominal examination - Pain control as needed (minimize narcotics), Imitrex for headaches, transition  to PO medications  - Restart home medications  - Mobilize as tolerated, PT/OT following recommending HHPT - Hold DVT prophylaxis given anemia, SCDs   Tanya Nguyen   [facility] 01/29/2018, 4:07 PM

## 2018-01-29 NOTE — Progress Notes (Signed)
Dr. Celine Ahr was paged and notified that Tanya Nguyen is complaining of abdominal pain after eating. Oxycodone is not available to be administered until after 1512. A one time order for 2mg  of IV morphine was given.

## 2018-01-29 NOTE — Consult Note (Signed)
PHARMACY - ADULT TOTAL PARENTERAL NUTRITION CONSULT NOTE   Pharmacy Consult for TPN management Indication: SBO  Patient Measurements: Height: 4\' 7"  (139.7 cm) Weight: 100 lb 9.6 oz (45.6 kg) IBW/kg (Calculated) : 34 TPN AdjBW (KG): 37.1 Body mass index is 23.38 kg/m.  Assessment: 52 year old female who presented to the ED on 11/14 with chronic abdominal pain. She had a PICC line placed yesterday, 11/14 with TPN to be started today after Ex-lap and gastrojejunostomy scheduled for this afternoon with Dr. Hampton Abbot. She has an NGT to low intermittent suction. No output recorded in I&O's at this time. She reports her UBW as 140 lbs and states that she last weighed this in June 2019 with a 40 lb weight loss over the course of 5 months and states that her most recent weight at her PCP was 102 lbs. This is a 20.5 lb weight loss since 07/28/17. (16.7% weight loss in less than 6 months).  GI: PPI Endo: SSI,  Insulin requirements in the past 24 hours: 0 units Lytes: 11/21- wnl Renal: SCr<1  Pulm: Cards:  Hepatobil: Neuro: ID: WBC wnl, afebrile MRSA PCR (-) No current antibiotics TPN Access: 01/19/18 TPN start date: 01/20/18 Nutritional Goals (per RD recommendation on 01/20/18): KCal: 1022 Protein: 72g Fluid: 1464ml  Goal TPN rate is 60 ml/hr (provides 90% of patient needs)  Current Nutrition:   Plan:  11/23: -Continue E 5/15 TPN and decrease rate to 20 mL/hr per Surgery + 20% lipids at 5ml/hr for 12 hours This TPN provides 72 g of protein, 216 g of dextrose, and 36 g of lipids which provides 1022 kCals per day, meeting 90% of patient needs  -advance to soft diet and wean TPN -Electrolytes in TPN: E 5/15, no additional -Add MVI, trace elements  -sensitive q6h SSI and adjust as needed -IVF-   LRw/ KCL20 meq IVMF at 40 ml/hr -  Monitor TPN labs, Mg, P and K  Monday and Thursday  next scheduled electrolytes are Monday 11/25 unless need to be reassessed before then.  11/24- TPN to be  discontinued when current bag finishes per discussion with dietician.   Stephanieann Popescu A, PharmD 01/29/2018,12:13 PM

## 2018-01-30 LAB — CBC
HCT: 26.5 % — ABNORMAL LOW (ref 36.0–46.0)
Hemoglobin: 8.5 g/dL — ABNORMAL LOW (ref 12.0–15.0)
MCH: 28.4 pg (ref 26.0–34.0)
MCHC: 32.1 g/dL (ref 30.0–36.0)
MCV: 88.6 fL (ref 80.0–100.0)
Platelets: 510 10*3/uL — ABNORMAL HIGH (ref 150–400)
RBC: 2.99 MIL/uL — ABNORMAL LOW (ref 3.87–5.11)
RDW: 13.1 % (ref 11.5–15.5)
WBC: 8.2 10*3/uL (ref 4.0–10.5)
nRBC: 0 % (ref 0.0–0.2)

## 2018-01-30 LAB — GLUCOSE, CAPILLARY
GLUCOSE-CAPILLARY: 98 mg/dL (ref 70–99)
Glucose-Capillary: 98 mg/dL (ref 70–99)
Glucose-Capillary: 107 mg/dL — ABNORMAL HIGH (ref 70–99)

## 2018-01-30 MED ORDER — ONDANSETRON HCL 4 MG PO TABS: 4.0000 mg | ORAL_TABLET | Freq: Three times a day (TID) | ORAL | 0 refills | Status: DC | PRN

## 2018-01-30 MED ORDER — OXYCODONE HCL 5 MG PO TABS: 5.0000 mg | ORAL_TABLET | Freq: Four times a day (QID) | ORAL | 0 refills | Status: DC | PRN

## 2018-01-30 NOTE — Discharge Summary (Signed)
Discharge Summary  Patient ID: Tanya Nguyen MRN: [id number] DOB/AGE: 1965/06/22 52 y.o.  Admit date: 01/19/2018 Discharge date: 01/30/2018  Discharge Diagnoses Gastric Outlet Obstruction   Consultants Gastroenterology  Procedures Exploratory Laparotomy with Gastrojejunostomy on 11/15 EGD on 11/20  HPI: Tanya Nguyen is a 52 y.o. female who presents to the ED 11/14 for abdominal pain. She notes that she has had about a 12 year history of abdominal pain however this pain is acutely worse. She notes the pain is diffuse throughout her abdomen and cramping in nature. Nothing makes the pain better. Additionally, she reports early satiety, nausea, and emesis most recently this morning. Of note, she has been following with Dr Allen Norris who preformed endoscopy on 11/07. 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 The patient does endorse significant NSAID use. Surgical pathology was obtained which was not concerning for cancerous process. She denied any fever, chills, CP, SOB, urinary changes. She does note she has had irregular bowel movements her entire life and goes "once every few months." Work up in the ED was concerning for gastric outlet obstruction.   Hospital Course: Patient was admitted to general surgery. Informed consent was obtained and documented, and patient underwent uneventful Exploratory Laparotomy with Gastrojejunostomy (Dr Hampton Abbot, 01/20/2018).  Post-operatively, the patient had acute blood loss anemia which was most likely secondary to surgery however gastroenterology was consulted and EGD was preformed on 11/20 which showed anastomotic swelling but no active bleeding. Her Hgb was trended which stabilized and her bloody NGT output stopped. Throughout the remainder of her hospital course the patient's pain and nausea improved/resolved and advancement of patient's diet and ambulation were well-tolerated. The remainder  of patient's hospital course was essentially unremarkable, and discharge planning was initiated accordingly with patient safely able to be discharged home with appropriate discharge instructions, pain control, and outpatient follow-up after all of her questions were answered to her expressed satisfaction.  Discharge Condition: Good   Physical Examination: Gen: Alert, NAD, pleasant Pulm: Normal effort Abd: Soft,peri-incisional soreness which is improved, non-distended,midline laparoscopic incision is CDI without erythema or drainage Skin: warm and dry, no rashes  Psych: A&Ox3     Allergies as of 01/30/2018      Reactions   Penicillins Shortness Of Breath, Diarrhea, Nausea And Vomiting   Has patient had a PCN reaction causing immediate rash, facial/tongue/throat swelling, SOB or lightheadedness with hypotension: yes Has patient had a PCN reaction causing severe rash involving mucus membranes or skin necrosis: no Has patient had a PCN reaction that required hospitalization no Has patient had a PCN reaction occurring within the last 10 years: about 10 years If all of the above answers are "NO", then may proceed with Cephalosporin use.   Sulfa Antibiotics Nausea And Vomiting   Other reaction(s): VOMITING      Medication List    TAKE these medications   cetirizine 10 MG tablet Commonly known as:  ZYRTEC Take 10 mg by mouth daily as needed for allergies.   cyanocobalamin 1000 MCG/ML injection Commonly known as:  (VITAMIN B-12) Inject into the skin once monthly for 6 months for low vitamin B 12.   gabapentin 100 MG capsule Commonly known as:  NEURONTIN Take 1 capsule (100 mg total) by mouth 3 (three) times daily.   levothyroxine 100 MCG tablet Commonly known as:  SYNTHROID, LEVOTHROID Take 1 tablet by mouth every morning on an empty stomach with water. No food or other medications for 30 minutes.  Na Sulfate-K Sulfate-Mg Sulf 17.5-3.13-1.6 GM/177ML Soln Take 1 kit by mouth  as directed.   NONFORMULARY OR COMPOUNDED ITEM See pharmacy note   omeprazole 40 MG capsule Commonly known as:  PRILOSEC Take 1 capsule (40 mg total) by mouth every morning.   ondansetron 4 MG tablet Commonly known as:  ZOFRAN Take 1 tablet (4 mg total) by mouth every 8 (eight) hours as needed for nausea or vomiting.   oxyCODONE 5 MG immediate release tablet Commonly known as:  Oxy IR/ROXICODONE Take 1 tablet (5 mg total) by mouth every 6 (six) hours as needed for severe pain or breakthrough pain.   pantoprazole 40 MG tablet Commonly known as:  PROTONIX Take 1 tablet (40 mg total) by mouth 2 (two) times daily.   ranitidine 150 MG tablet Commonly known as:  ZANTAC Take 1 tablet by mouth at bedtime   sertraline 50 MG tablet Commonly known as:  ZOLOFT TAKE 1 TABLET BY MOUTH ONCE A DAY   sucralfate 1 g tablet Commonly known as:  CARAFATE Take 1 tablet (1 g total) by mouth 3 (three) times daily before meals.   SUMAtriptan 25 MG tablet Commonly known as:  IMITREX TAKE 1 TABLET EVERY 2 HOURS AS NEEDED FOR MIGRAINE, MAY REPEAT IN 2 HOURS IF HEADACHE PERSISTS OR RECURS   SYRINGE-NEEDLE (DISP) 3 ML 25G X 1" 3 ML Misc Use as instructed to injection B12 every 30 days   topiramate 25 MG tablet Commonly known as:  TOPAMAX Take 1 tablet by mouth at bedtime for migraine prevention.   traZODone 150 MG tablet Commonly known as:  DESYREL Take 1 tablet (150 mg total) by mouth at bedtime.        Follow-up Information    Piscoya, Jacqulyn Bath, MD. Schedule an appointment as soon as possible for a visit in 1 week(s).   Specialty:  General Surgery Why:  gastric outlet obstruction, s/p gastrojejunostomy on 11/15....has staples need removed Contact information: [address] [place] [address] [phone]        [name], MD. Schedule an appointment as soon as possible for a visit in 3 week(s).   Specialty:  Gastroenterology Why:  hospital follow up for  gastric outlet obstruction, need EGD to r/o malgninant cause. Due for colonoscopy  Contact information: [address] [place]  [place] [address] [phone]           Signed: Edison Simon , PA-C [facility]  01/30/2018, 10:41 AM [phone] M-F: 7am - 4pm

## 2018-01-30 NOTE — Progress Notes (Signed)
Discharge instructions reviewed with patient including followup visits and new medications.  Understanding was verbalized and all questions were answered.  IV removed without complication; patient tolerated well.  Patient discharged home via wheelchair in stable condition escorted by volunteer staff.  

## 2018-01-30 NOTE — Discharge Instructions (Signed)
In addition to included general post-operative instructions for exploratory laparotomy and gastrojejunostomy,  Diet: Resume home heart healthy  diet.   Activity: No heavy lifting >20 pounds (children, pets, laundry, garbage) or strenuous activity until follow-up, but light activity and walking are encouraged. Do not drive or drink alcohol if taking narcotic pain medications.  Wound care: You may shower/get incision wet with soapy water and pat dry (do not rub incisions), but no baths or submerging incision underwater until follow-up.   Medications: PLEASE AVOID ALL NSAIDs (example: BC Powder, Motrin). For mild to moderate pain: acetaminophen (Tylenol). Combining Tylenol with alcohol can substantially increase your risk of causing liver disease. Narcotic pain medications, if prescribed, can be used for severe pain, though may cause nausea, constipation, and drowsiness. Do not combine Tylenol and Percocet (or similar) within a 6 hour period as Percocet (and similar) contain(s) Tylenol. If you do not need the narcotic pain medication, you do not need to fill the prescription.  Call office [phone]) at any time if any questions, worsening pain, fevers/chills, bleeding, drainage from incision site, or other concerns.

## 2018-01-30 NOTE — Progress Notes (Signed)
OT Cancellation Note  Patient Details Name: Tanya Nguyen MRN: [id number] DOB: May 08, 1965   Cancelled Treatment:    Reason Eval/Treat Not Completed: Fatigue/lethargy limiting ability to participate. Upon attempt, pt very sleepy, difficult to rouse, pt briefly stated she just had medication which makes her very sleepy. Per chart review, pt had Ativan recently. Will re-attempt at later date/time as pt is able to participate and medically appropriate.   Jeni Salles, MPH, MS, OTR/L ascom [phone] 01/30/18, 10:02 AM

## 2018-01-30 NOTE — Care Management Note (Signed)
Case Management Note  Patient Details  Name: Tanya Nguyen MRN: [id number] Date of Birth: 1966-01-08   Patient to discharge today.  Helene Kelp with [facility] notified of discharge.  Youth RW delivered to room by Brenton Grills from [place] prior to discharge.  Husband to transport.  RNCM signing off.   Subjective/Objective:                    Action/Plan:   Expected Discharge Date:  01/30/18               Expected Discharge Plan:  [place]  In-House Referral:     Discharge planning Services  CM Consult  Post Acute Care Choice:  Home Health, Durable Medical Equipment Choice offered to:  Patient  DME Arranged:  [facility] DME Agency:  [place]:  PT [place]:  [facility] (now [facility])  Status of Service:  Completed, signed off  If discussed at [employer] of Stay Meetings, dates discussed:    Additional Comments:  Beverly Sessions, RN 01/30/2018, 2:03 PM

## 2018-01-30 NOTE — Progress Notes (Signed)
Nutrition Follow Up Note   DOCUMENTATION CODES:   Severe malnutrition in context of chronic illness  INTERVENTION:    Ensure Enlive po BID, each supplement provides 350 kcal and 20 grams of protein  Magic cup TID with meals, each supplement provides 290 kcal and 9 grams of protein  NUTRITION DIAGNOSIS:   Severe Malnutrition related to chronic illness (gastric outlet obstruction, chronic abdominal pain) as evidenced by moderate fat depletion, severe muscle depletion, energy intake < or equal to 75% for > or equal to 1 month, percent weight loss (16.7% weight loss in less than 6 months).  GOAL:   Patient will meet greater than or equal to 90% of their needs  -met with TPN  MONITOR:   PO intake, Supplement acceptance, Labs, Weight trends, Skin, I & O's  ASSESSMENT:   52 y.o.females/p open gastro-jejunal bypass for chronic NSAIDs-associated duodenal obstruction, complicated by pertinent comorbidities includingGERD with history of bleeding duodenal ulcer, hypothyroidism, migraine headaches, nephrolithiasis, and major depression disorder   Pt now s/p open gastro-jejunal bypass for chronic NSAIDs-associated duodenal obstruction 11/15  Pt advanced to soft diet 11/23; pt tolerating well but not eating very much. TPN discontinued over the weekend. Pt's weight down ~3lbs from admit weight; RD will continue to monitor. Pt refusing most Ensure supplements. RD will add Magic Cups to trays. Pt to possibly discharge today.    Medications reviewed and include: insulin, synthroid  Labs reviewed: cbgs- 98, 98 x 24 hrs  Diet Order:   Diet Order            DIET SOFT Room service appropriate? Yes; Fluid consistency: Thin  Diet effective now             EDUCATION NEEDS:   Education needs have been addressed  Skin:  Incision abdomen   Last BM:  11/25- type 7  Height:   Ht Readings from Last 1 Encounters:  01/19/18 _0  (1.397 m)    Weight:   Wt Readings from Last 1  Encounters:  01/30/18 44.7 kg    Ideal Body Weight:  34.09 kg  BMI:  Body mass index is 22.9 kg/m.  Estimated Nutritional Needs:   Kcal:  1300-1500kcal/day   Protein:  60-70g/day   Fluid:  >1.3L/day   Koleen Distance MS, RD, LDN Pager #- [phone] Office#- [phone] After Hours Pager: [phone]

## 2018-02-01 ENCOUNTER — Telehealth: Payer: Self-pay | Admitting: Primary Care

## 2018-02-01 ENCOUNTER — Other Ambulatory Visit: Payer: Self-pay | Admitting: Family Medicine

## 2018-02-01 NOTE — Telephone Encounter (Signed)
Will be going out to pt's home on 12.2.19 to start with home health services.

## 2018-02-01 NOTE — Telephone Encounter (Signed)
Noted  

## 2018-02-01 NOTE — Telephone Encounter (Signed)
Last office visit 12/19/2017 with Dr. Darnell Level for epigastric pain.  Tramadol not on current medication list.  No future appointments.  Refill?

## 2018-02-06 ENCOUNTER — Other Ambulatory Visit: Payer: Self-pay

## 2018-02-06 ENCOUNTER — Encounter: Payer: Self-pay | Admitting: Surgery

## 2018-02-06 ENCOUNTER — Ambulatory Visit (INDEPENDENT_AMBULATORY_CARE_PROVIDER_SITE_OTHER): Payer: BLUE CROSS/BLUE SHIELD | Admitting: Surgery

## 2018-02-06 ENCOUNTER — Telehealth: Payer: Self-pay

## 2018-02-06 VITALS — BP 130/82 | HR 105 | Temp 96.4°F | Resp 25 | Ht <= 58 in | Wt 99.4 lb

## 2018-02-06 DIAGNOSIS — Z09 Encounter for follow-up examination after completed treatment for conditions other than malignant neoplasm: Secondary | ICD-10-CM

## 2018-02-06 MED ORDER — SUCRALFATE 1 GM/10ML PO SUSP: 1.0000 g | Freq: Three times a day (TID) | ORAL | 0 refills | Status: DC

## 2018-02-06 MED ORDER — OXYCODONE-ACETAMINOPHEN 5-325 MG PO TABS: 1.0000 | ORAL_TABLET | ORAL | 0 refills | Status: DC | PRN

## 2018-02-06 MED ORDER — ONDANSETRON HCL 4 MG PO TABS: 4.0000 mg | ORAL_TABLET | Freq: Three times a day (TID) | ORAL | 0 refills | Status: DC | PRN

## 2018-02-06 NOTE — Progress Notes (Signed)
S/p gastrojej w omega loop for GOO (PUD) by Dr. Hampton Abbot 16 days ago She has some nausea and some mild pain Taking PO Some constipation No fevers or chills   PE NAD Abd: soft, nt, staples removed. No infection or peritonitis  A/P overall doing well zofran and percocet refill. Last time from my stand point No surgical complications or need for re intervention F/U 10 days w Dr. Hampton Abbot

## 2018-02-06 NOTE — Telephone Encounter (Signed)
Noted and agree. 

## 2018-02-06 NOTE — Patient Instructions (Signed)
Patient is scheduled and should follow up as scheduled.  Call the office with any questions or concerns.

## 2018-02-06 NOTE — Telephone Encounter (Signed)
Darlina Guys nurse with [facility] wanted Gentry Fitz NP to know that [facility] nursing would see pt for first visit on 02/07/18. FYI to Gentry Fitz NP.

## 2018-02-07 ENCOUNTER — Telehealth: Payer: Self-pay | Admitting: *Deleted

## 2018-02-07 NOTE — Telephone Encounter (Signed)
She stats she does not want to take and liquid for her bowels to move, "it made me too sick" and "I just can't do it". She states she will get something over the counter for her bowels to move and call back if needed, pt agrees.

## 2018-02-07 NOTE — Telephone Encounter (Signed)
Gave the approval verbal order to [facility]

## 2018-02-07 NOTE — Telephone Encounter (Signed)
Answering service called and stated that patient needs a call back because she is vomiting her medication up. They did not say what medicine it was, the answering phones just stated that the patient wanted a call back

## 2018-02-07 NOTE — Telephone Encounter (Signed)
Spoke to [place], PT with [facility], who is requesting verbal orders for 2x/wk for 3 wks and 1x/wk for 2wks

## 2018-02-07 NOTE — Telephone Encounter (Signed)
Patient is calling again and is asking for a call back. Please call patient and advise.

## 2018-02-07 NOTE — Telephone Encounter (Signed)
Approved.  

## 2018-02-08 NOTE — Anesthesia Postprocedure Evaluation (Signed)
Anesthesia Post Note  Patient: Tanya Nguyen  Procedure(s) Performed: ESOPHAGOGASTRODUODENOSCOPY (EGD) WITH PROPOFOL (N/A )  Patient location during evaluation: Endoscopy Anesthesia Type: General Level of consciousness: awake and alert Pain management: pain level controlled Vital Signs Assessment: post-procedure vital signs reviewed and stable Respiratory status: spontaneous breathing, nonlabored ventilation and respiratory function stable Cardiovascular status: blood pressure returned to baseline and stable Postop Assessment: no apparent nausea or vomiting Anesthetic complications: no     Last Vitals:  Vitals:   01/30/18 0800 01/30/18 1111  BP: 112/84 (!) 101/52  Pulse: (!) 106 (!) 101  Resp: 16 16  Temp: 37 C 36.8 C  SpO2: 97%     Last Pain:  Vitals:   01/30/18 1244  TempSrc:   PainSc: 5                  Tommye Lehenbauer Harvie Heck

## 2018-02-13 ENCOUNTER — Telehealth: Payer: Self-pay | Admitting: *Deleted

## 2018-02-13 NOTE — Telephone Encounter (Signed)
Patient states she vomited Friday and Saturday.  She has stopped taking the Percocet, sucralfate. She states they were causing her to have nausea. The ranitidine and zofran has helped a lot and she has not vomited since Saturday. Since Saturday she is eating and keeping it down. She is scheduled with Dr.Piscoya 02/17/18 however I asked if she would like to be sooner. Patient added to schedule 02/14/18.

## 2018-02-13 NOTE — Telephone Encounter (Signed)
Patient called and had surgery on 01/19/18 and stated that she has been vomiting since Friday morning, pt denies fever and chills. Please call and advise

## 2018-02-14 ENCOUNTER — Other Ambulatory Visit: Payer: Self-pay

## 2018-02-14 ENCOUNTER — Encounter: Payer: Self-pay | Admitting: Surgery

## 2018-02-14 ENCOUNTER — Ambulatory Visit (INDEPENDENT_AMBULATORY_CARE_PROVIDER_SITE_OTHER): Payer: BLUE CROSS/BLUE SHIELD | Admitting: Surgery

## 2018-02-14 DIAGNOSIS — Z09 Encounter for follow-up examination after completed treatment for conditions other than malignant neoplasm: Secondary | ICD-10-CM

## 2018-02-14 MED ORDER — ONDANSETRON 4 MG PO TBDP: 4.0000 mg | ORAL_TABLET | Freq: Three times a day (TID) | ORAL | 0 refills | Status: DC | PRN

## 2018-02-14 MED ORDER — PEG 3350-KCL-NABCB-NACL-NASULF 236 G PO SOLR: 4000.0000 mL | Freq: Once | ORAL | 0 refills | Status: AC

## 2018-02-14 NOTE — Patient Instructions (Signed)
We will send the referral to Dr.Wohl. Someone from their office will contact you to schedule an appointment.  Please pick up your medication at the pharmacy today.

## 2018-02-14 NOTE — Progress Notes (Signed)
02/14/2018  HPI: Tanya Nguyen is a 52 y.o. 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  She presents for another follow up.  She reports that she has had issues with nausea and emesis depending on medications that she takes, particularly Carafate.  She's taking her PPIs and is able to take nausea medication which helps.  She is also eating small volumes/quantity only and has not been gaining weight yet.  Still having pain issues, particularly upper abdomen at the incision.  Vital signs: There were no vitals taken for this visit.   Physical Exam: Constitutional:  No acute distress Abdomen:  Soft, non-distended, currently non-tender to palpation.  Incision healing well except for a small 1.5 cm length section that has mildly opened to 1 mm width at the superior portion of the incision.  Dressed with bandaid.  Assessment/Plan: This is a 52 y.o. female s/p exlap and gastrojejunostomy with omega loop.  --reassured the patient that the incisional discomfort will continue to improve as the wound continues to heal and the sutures dissolve. --given her nausea and other symptoms, will refer to Dr. Allen Norris with GI to do a repeat EGD.  He had initially noted the duodenal bulb stenosis.  Dr. Vicente Males had noted edema of the [place] anastomosis during her hospitalization.  It would be important to determine whether the anastomosis is having any issues, or if the JJ anastomosis of the omega loop is having any issues.   --instructed the patient to continue taking her PPI, and she can pause the Carafate if it's causing nausea. --may apply bandaid or gauze dressing over midline wound.  This should heal soon. --can take Ensure for nutritional supplementation. --will follow up with patient depending on EGD results.   Melvyn Neth, [facility]

## 2018-02-16 ENCOUNTER — Encounter: Payer: Self-pay | Admitting: *Deleted

## 2018-02-16 ENCOUNTER — Other Ambulatory Visit: Payer: Self-pay

## 2018-02-17 ENCOUNTER — Telehealth: Payer: Self-pay | Admitting: *Deleted

## 2018-02-17 ENCOUNTER — Encounter: Payer: BLUE CROSS/BLUE SHIELD | Admitting: Surgery

## 2018-02-17 MED ORDER — OXYCODONE HCL 5 MG PO TABS: 5.0000 mg | ORAL_TABLET | Freq: Four times a day (QID) | ORAL | 0 refills | Status: DC | PRN

## 2018-02-17 NOTE — Telephone Encounter (Signed)
Patient called in today and his still having a lot of pain at incision site. Dr. Hampton Abbot sent Rx  Rx printed on wrong paper. Dr. Hampton Abbot wrote it out

## 2018-02-20 ENCOUNTER — Ambulatory Visit: Payer: BLUE CROSS/BLUE SHIELD | Admitting: Gastroenterology

## 2018-02-20 ENCOUNTER — Encounter: Payer: Self-pay | Admitting: Gastroenterology

## 2018-02-20 VITALS — BP 121/68 | HR 109 | Ht <= 58 in | Wt 95.8 lb

## 2018-02-20 DIAGNOSIS — K311 Adult hypertrophic pyloric stenosis: Secondary | ICD-10-CM

## 2018-02-20 DIAGNOSIS — R112 Nausea with vomiting, unspecified: Secondary | ICD-10-CM

## 2018-02-20 NOTE — Progress Notes (Signed)
Primary Care Physician: Pleas Koch, NP  Primary Gastroenterologist:  Dr. [name]  Chief Complaint  Patient presents with  . Hospitalization Follow-up    Gastric obstruction    HPI: Tanya Nguyen is a 52 y.o. female here for follow-up after having an EGD showing a gastric outlet obstruction.  The patient had a gastrojejunostomy with resulting bleeding.  The patient also had a colonoscopy at the time of her finding of gastric outlet obstruction which was incomplete due to poor prep.  The patient states that she has been keeping her weight on now and has stopped her NSAID use.  She continues to have some nausea.  She also has constipation and has tried fiber but it makes her vomit.  Current Outpatient Medications  Medication Sig Dispense Refill  . cetirizine (ZYRTEC) 10 MG tablet Take 10 mg by mouth daily as needed for allergies.     . NONFORMULARY OR COMPOUNDED ITEM See pharmacy note 120 each 2  . omeprazole (PRILOSEC) 40 MG capsule Take 1 capsule (40 mg total) by mouth every morning. 90 capsule 1  . ondansetron (ZOFRAN ODT) 4 MG disintegrating tablet Take 1 tablet (4 mg total) by mouth every 8 (eight) hours as needed for nausea or vomiting. Take 1 tablet 4 mg total  every 6 hours. 20 tablet 0  . ondansetron (ZOFRAN) 4 MG tablet Take 1 tablet (4 mg total) by mouth every 8 (eight) hours as needed for nausea or vomiting. 20 tablet 0  . oxyCODONE (ROXICODONE) 5 MG immediate release tablet Take 1 tablet (5 mg total) by mouth every 6 (six) hours as needed for severe pain. 20 tablet 0  . pantoprazole (PROTONIX) 40 MG tablet Take 1 tablet (40 mg total) by mouth 2 (two) times daily. 60 tablet 11  . SUMAtriptan (IMITREX) 25 MG tablet TAKE 1 TABLET EVERY 2 HOURS AS NEEDED FOR MIGRAINE, MAY REPEAT IN 2 HOURS IF HEADACHE PERSISTS OR RECURS 10 tablet 0  . topiramate (TOPAMAX) 25 MG tablet Take 1 tablet by mouth at bedtime for migraine prevention. 90 tablet 1  . traZODone (DESYREL) 150 MG  tablet Take 1 tablet (150 mg total) by mouth at bedtime. 90 tablet 2  . levothyroxine (SYNTHROID, LEVOTHROID) 100 MCG tablet Take 1 tablet by mouth every morning on an empty stomach with water. No food or other medications for 30 minutes. 90 tablet 3  . ranitidine (ZANTAC) 150 MG tablet Take 1 tablet by mouth at bedtime (Patient not taking: Reported on 02/16/2018) 90 tablet 1  . sucralfate (CARAFATE) 1 GM/10ML suspension Take 10 mLs (1 g total) by mouth 4 (four) times daily -  with meals and at bedtime. (Patient not taking: Reported on 02/16/2018) 420 mL 0   No current facility-administered medications for this visit.     Allergies as of 02/20/2018 - Review Complete 02/20/2018  Allergen Reaction Noted  . Penicillins Shortness Of Breath, Diarrhea, and Nausea And Vomiting 02/26/2015  . Sulfa antibiotics Nausea And Vomiting 06/26/2012    ROS:  General: Negative for anorexia, weight loss, fever, chills, fatigue, weakness. ENT: Negative for hoarseness, difficulty swallowing , nasal congestion. CV: Negative for chest pain, angina, palpitations, dyspnea on exertion, peripheral edema.  Respiratory: Negative for dyspnea at rest, dyspnea on exertion, cough, sputum, wheezing.  GI: See history of present illness. GU:  Negative for dysuria, hematuria, urinary incontinence, urinary frequency, nocturnal urination.  Endo: Negative for unusual weight change.    Physical Examination:   BP 121/68  Pulse (!) 109   Ht 4\' 7"  (1.397 m)   Wt 95 lb 12.8 oz (43.5 kg)   BMI 22.27 kg/m   General: Well-nourished, well-developed in no acute distress.  Eyes: No icterus. Conjunctivae pink. Mouth: Oropharyngeal mucosa moist and pink , no lesions erythema or exudate. Extremities: No lower extremity edema. No clubbing or deformities. Neuro: Alert and oriented x 3.  Grossly intact. Skin: Warm and dry, no jaundice.   Psych: Alert and cooperative, normal mood and affect.  Labs:    Imaging Studies: Dg Abd 1  View  Result Date: 01/22/2018 CLINICAL DATA:  NG tube placement EXAM: ABDOMEN - 1 VIEW COMPARISON:  01/19/2018 FINDINGS: NG tube tip is in the mid stomach with the side port near the GE junction. Nonobstructive bowel gas pattern. Visualized lung bases clear. IMPRESSION: NG tube tip in the mid stomach. Electronically Signed   By: Rolm Baptise M.D.   On: 01/22/2018 18:27   Ct Abdomen Pelvis W Contrast  Result Date: 01/24/2018 CLINICAL DATA:  Abdominal pain following gastrojejunal bypass due to duodenal obstruction EXAM: CT ABDOMEN AND PELVIS WITH CONTRAST TECHNIQUE: Multidetector CT imaging of the abdomen and pelvis was performed using the standard protocol following bolus administration of intravenous contrast. Oral contrast also administered. CONTRAST:  63mL OMNIPAQUE IOHEXOL 300 MG/ML SOLN, 96mL ISOVUE-300 IOPAMIDOL (ISOVUE-300) INJECTION 61% COMPARISON:  January 19, 2018 FINDINGS: Lower chest: There is a small left pleural effusion with atelectatic change in the left base. Hepatobiliary: There is a 1 cm cyst in the left lobe of the liver. Gallbladder is absent. There is no appreciable biliary duct dilatation. Note that there is air in the biliary ductal system, likely of postoperative etiology. Pancreas: No pancreatic mass or inflammatory focus. Spleen: No splenic lesions are evident. Adrenals/Urinary Tract: Adrenals appear unremarkable bilaterally. Kidneys bilaterally show no evident mass or hydronephrosis on either side. There is no evident renal or ureteral calculus on either side. Urinary bladder is midline with wall thickness within normal limits. Urinary bladder is slightly distended currently. Stomach/Bowel: There is pneumoperitoneum which probably is of postoperative etiology given the recent surgery. Stomach is moderately distended with fluid and air. Nasogastric tube tip is in the stomach. There is air and soft tissue material along the greater curvature of the stomach near the suture line,  likely due to localized postoperative hematoma. There is soft tissue thickening in this area. Note that there is a degree of generalized wall thickening of the stomach, likely due to inflammation from recent obstruction and surgery. No active gastrointestinal bleeding site currently evident. There is an area of oral contrast posterior and inferior to the suture line along the greater curvature of the stomach, best seen on axial slices 33 and 34 series 2 as well as coronal slices 25 through 28 series 5. This finding is also appreciable on sagittal slice 61 series 6. A focal area of localized dehiscence along the greater curvature of the stomach posteriorly and inferiorly must be of concern. No similar changes are seen elsewhere. There is fluid throughout most of the small bowel. Note that there are mildly dilated loops of small and large bowel. Oral contrast is seen within the colon. There is air in the wall of the jejunum at the postoperative site which is felt to be due to postoperative change. There is no transition zone to suggest bowel obstruction. No portal venous air evident. Vascular/Lymphatic: There is no abdominal aortic aneurysm. No focal vascular lesions are evident. 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 There is no adenopathy in abdomen or pelvis. Reproductive: Uterus is absent.  There is no pelvic mass. Other: There is no evident periappendiceal region inflammation. No abscess or appreciable ascites seen in the abdomen or pelvis. There is postoperative change in the anterior abdominal wall region with soft tissue thickening and mild hematoma in the periumbilical region. Musculoskeletal: No blastic or lytic bone lesions are evident. No appreciable intramuscular lesions are apparent. IMPRESSION: 1. Evidence of gastrojejunal bypass grafting. Wall thickening as well as air and fluid noted throughout the stomach. Along the greater curvature near the postoperative site, there is  probable hematoma with entrapped air. There is a suspected localized area of extraluminal oral contrast along the posteromedial aspect of the greater curvature, raising concern for a small focus of dehiscence. No similar changes elsewhere. No active gastrointestinal bleeding seen. 2. Foci of free intraperitoneal air are felt to be due to postoperative change. Air within the biliary ductal system is likely felt to be due to postoperative changes. 3. No bowel obstruction evident. There is fluid throughout the small bowel with mild generalized bowel dilatation, likely due to postoperative ileus. 4.  Small left pleural effusion with left base atelectasis. 5.  No renal or ureteral calculi.  No hydronephrosis. 6. Postoperative change anterior abdominal wall with mild hematoma formation in the periumbilical region. These results were called by telephone at the time of interpretation on 01/24/2018 at 2:27 pm to Dr. Olean Ree , who verbally acknowledged these results. Electronically Signed   By: Lowella Grip III M.D.   On: 01/24/2018 14:28   Dg Duanne Limerick W/o Kub  Result Date: 01/24/2018 CLINICAL DATA:  Evaluate for anastomosis leak. EXAM: WATER SOLUBLE UPPER GI SERIES TECHNIQUE: Single-column upper GI series was performed using water soluble contrast. CONTRAST:  Gastrografin COMPARISON:  CT 01/24/2018. FLUOROSCOPY TIME:  Fluoroscopy Time:  7 minutes 48 seconds Radiation Exposure Index (if provided by the fluoroscopic device): 128.3 mGy FINDINGS: Scout film reveals good positioning of NG tube. Contrast from prior CT noted over the colon. Gastrografin was administered through the NG tube without complication. Stomach and duodenum appeared normal. Gastric wall thickening noted at the level of the gastro jejunal anastomosis, this is most likely postsurgical. Contrast its emptied into the digital loop. No leakage was demonstrated. Following the procedure the majority of the oral contrast was removed with the NG tube.  IMPRESSION: No evidence of anastomotic leak. Electronically Signed   By: Marcello Moores  Register   On: 01/24/2018 16:43    Assessment and Plan:   Tanya Nguyen is a 52 y.o. y/o female who comes in today with a history of gastric outlet obstruction which was treated with a gastrojejunostomy.  The patient is here for evaluation after having an admission to the hospital at the end of November with a possible upper GI bleed.  The patient had an EGD by Dr. Vicente Males without any active bleeding.  The patient has also not had a complete colonoscopy due to her poor prep.  The patient will be set up for an EGD and colonoscopy. I have discussed risks & benefits which include, but are not limited to, bleeding, infection, perforation & drug reaction.  The patient agrees with this plan & written consent will be obtained.       [name], MD. [name]   Note: This dictation was prepared with Dragon dictation along with smaller phrase technology. Any transcriptional errors that result from this process are unintentional.

## 2018-02-22 ENCOUNTER — Telehealth: Payer: Self-pay | Admitting: *Deleted

## 2018-02-22 ENCOUNTER — Other Ambulatory Visit: Payer: Self-pay

## 2018-02-22 MED ORDER — ONDANSETRON 4 MG PO TBDP: 4.0000 mg | ORAL_TABLET | Freq: Three times a day (TID) | ORAL | 0 refills | Status: DC | PRN

## 2018-02-22 NOTE — Telephone Encounter (Signed)
Patient called and wanted to get another refill on Zofran, she now uses [facility] on [address]  Please call and advise.

## 2018-02-22 NOTE — Discharge Instructions (Signed)
General Anesthesia, Adult, Care After  This sheet gives you information about how to care for yourself after your procedure. Your health care provider may also give you more specific instructions. If you have problems or questions, contact your health care provider.  What can I expect after the procedure?  After the procedure, the following side effects are common:  Pain or discomfort at the IV site.  Nausea.  Vomiting.  Sore throat.  Trouble concentrating.  Feeling cold or chills.  Weak or tired.  Sleepiness and fatigue.  Soreness and body aches. These side effects can affect parts of the body that were not involved in surgery.  Follow these instructions at home:    For at least 24 hours after the procedure:  Have a responsible adult stay with you. It is important to have someone help care for you until you are awake and alert.  Rest as needed.  Do not:  Participate in activities in which you could fall or become injured.  Drive.  Use heavy machinery.  Drink alcohol.  Take sleeping pills or medicines that cause drowsiness.  Make important decisions or sign legal documents.  Take care of children on your own.  Eating and drinking  Follow any instructions from your health care provider about eating or drinking restrictions.  When you feel hungry, start by eating small amounts of foods that are soft and easy to digest (bland), such as toast. Gradually return to your regular diet.  Drink enough fluid to keep your urine pale yellow.  If you vomit, rehydrate by drinking water, juice, or clear broth.  General instructions  If you have sleep apnea, surgery and certain medicines can increase your risk for breathing problems. Follow instructions from your health care provider about wearing your sleep device:  Anytime you are sleeping, including during daytime naps.  While taking prescription pain medicines, sleeping medicines, or medicines that make you drowsy.  Return to your normal activities as told by your health care  provider. Ask your health care provider what activities are safe for you.  Take over-the-counter and prescription medicines only as told by your health care provider.  If you smoke, do not smoke without supervision.  Keep all follow-up visits as told by your health care provider. This is important.  Contact a health care provider if:  You have nausea or vomiting that does not get better with medicine.  You cannot eat or drink without vomiting.  You have pain that does not get better with medicine.  You are unable to pass urine.  You develop a skin rash.  You have a fever.  You have redness around your IV site that gets worse.  Get help right away if:  You have difficulty breathing.  You have chest pain.  You have blood in your urine or stool, or you vomit blood.  Summary  After the procedure, it is common to have a sore throat or nausea. It is also common to feel tired.  Have a responsible adult stay with you for the first 24 hours after general anesthesia. It is important to have someone help care for you until you are awake and alert.  When you feel hungry, start by eating small amounts of foods that are soft and easy to digest (bland), such as toast. Gradually return to your regular diet.  Drink enough fluid to keep your urine pale yellow.  Return to your normal activities as told by your health care provider. Ask your health care   provider what activities are safe for you.  This information is not intended to replace advice given to you by your health care provider. Make sure you discuss any questions you have with your health care provider.  Document Released: 05/31/2000 Document Revised: 10/08/2016 Document Reviewed: 10/08/2016  Elsevier Interactive Patient Education  2019 [employer]

## 2018-02-22 NOTE — Telephone Encounter (Signed)
Refill for Zofran approved per Dr Hampton Abbot, patient notified.

## 2018-02-23 ENCOUNTER — Telehealth: Payer: Self-pay | Admitting: Gastroenterology

## 2018-02-23 NOTE — Telephone Encounter (Signed)
Pt left vm  She was supposed to have procedure this morning but vomited the prep up and had to put her mother in hospital please call pt

## 2018-02-24 NOTE — Telephone Encounter (Signed)
Discussed prep instructions with pt and advised her she can do the miralax and ducolax prep due to vomiting other preps.

## 2018-02-27 ENCOUNTER — Ambulatory Visit
Admission: RE | Disposition: A | Discharge status: Home / Self Care | Payer: Self-pay | Source: Home / Self Care | Attending: Gastroenterology

## 2018-02-27 ENCOUNTER — Ambulatory Visit
Admission: RE | Admit: 2018-02-27 | Discharge: 2018-02-27 | Disposition: A | Discharge status: Home / Self Care | Payer: BLUE CROSS/BLUE SHIELD | Attending: Gastroenterology | Admitting: Gastroenterology

## 2018-02-27 ENCOUNTER — Encounter: Payer: Self-pay | Admitting: Anesthesiology

## 2018-02-27 ENCOUNTER — Ambulatory Visit: Payer: BLUE CROSS/BLUE SHIELD | Admitting: Anesthesiology

## 2018-02-27 ENCOUNTER — Ambulatory Visit: Payer: Self-pay | Admitting: Primary Care

## 2018-02-27 DIAGNOSIS — F329 Major depressive disorder, single episode, unspecified: Secondary | ICD-10-CM | POA: Insufficient documentation

## 2018-02-27 DIAGNOSIS — K311 Adult hypertrophic pyloric stenosis: Secondary | ICD-10-CM | POA: Insufficient documentation

## 2018-02-27 DIAGNOSIS — Z8711 Personal history of peptic ulcer disease: Secondary | ICD-10-CM | POA: Insufficient documentation

## 2018-02-27 DIAGNOSIS — Z980 Intestinal bypass and anastomosis status: Secondary | ICD-10-CM | POA: Diagnosis not present

## 2018-02-27 DIAGNOSIS — Z79899 Other long term (current) drug therapy: Secondary | ICD-10-CM | POA: Diagnosis not present

## 2018-02-27 DIAGNOSIS — K219 Gastro-esophageal reflux disease without esophagitis: Secondary | ICD-10-CM | POA: Insufficient documentation

## 2018-02-27 DIAGNOSIS — R634 Abnormal weight loss: Secondary | ICD-10-CM | POA: Insufficient documentation

## 2018-02-27 DIAGNOSIS — E039 Hypothyroidism, unspecified: Secondary | ICD-10-CM | POA: Insufficient documentation

## 2018-02-27 DIAGNOSIS — R112 Nausea with vomiting, unspecified: Secondary | ICD-10-CM | POA: Diagnosis not present

## 2018-02-27 DIAGNOSIS — Z09 Encounter for follow-up examination after completed treatment for conditions other than malignant neoplasm: Secondary | ICD-10-CM

## 2018-02-27 DIAGNOSIS — G43909 Migraine, unspecified, not intractable, without status migrainosus: Secondary | ICD-10-CM | POA: Insufficient documentation

## 2018-02-27 DIAGNOSIS — R1110 Vomiting, unspecified: Secondary | ICD-10-CM

## 2018-02-27 DIAGNOSIS — Z6821 Body mass index (BMI) 21.0-21.9, adult: Secondary | ICD-10-CM | POA: Insufficient documentation

## 2018-02-27 DIAGNOSIS — K640 First degree hemorrhoids: Secondary | ICD-10-CM | POA: Insufficient documentation

## 2018-02-27 HISTORY — PX: ESOPHAGOGASTRODUODENOSCOPY (EGD) WITH PROPOFOL: SHX5813

## 2018-02-27 HISTORY — PX: COLONOSCOPY WITH PROPOFOL: SHX5780

## 2018-02-27 SURGERY — COLONOSCOPY WITH PROPOFOL
Anesthesia: General | Site: Throat

## 2018-02-27 MED ORDER — ONDANSETRON HCL 4 MG/2ML IJ SOLN: 4.0000 mg | Freq: Once | INTRAMUSCULAR | Status: DC | PRN

## 2018-02-27 MED ORDER — LACTATED RINGERS IV SOLN
10.0000 mL/h | INTRAVENOUS | Status: DC
  Administered 2018-02-27: 10 mL/h via INTRAVENOUS

## 2018-02-27 MED ORDER — PROPOFOL 10 MG/ML IV BOLUS
INTRAVENOUS | Status: DC | PRN
  Administered 2018-02-27 (×5): 20 mg via INTRAVENOUS
  Administered 2018-02-27: 30 mg via INTRAVENOUS
  Administered 2018-02-27: 20 mg via INTRAVENOUS
  Administered 2018-02-27: 30 mg via INTRAVENOUS
  Administered 2018-02-27: 60 mg via INTRAVENOUS
  Administered 2018-02-27 (×9): 20 mg via INTRAVENOUS

## 2018-02-27 MED ORDER — SODIUM CHLORIDE 0.9 % IV SOLN: INTRAVENOUS | Status: DC

## 2018-02-27 MED ORDER — GLYCOPYRROLATE 0.2 MG/ML IJ SOLN
INTRAMUSCULAR | Status: DC | PRN
  Administered 2018-02-27: 0.2 mg via INTRAVENOUS

## 2018-02-27 MED ORDER — LIDOCAINE HCL (CARDIAC) PF 100 MG/5ML IV SOSY
PREFILLED_SYRINGE | INTRAVENOUS | Status: DC | PRN
  Administered 2018-02-27: 40 mg via INTRAVENOUS

## 2018-02-27 MED ORDER — STERILE WATER FOR IRRIGATION IR SOLN
Status: DC | PRN
  Administered 2018-02-27: 10:00:00

## 2018-02-27 SURGICAL SUPPLY — 8 items
BLOCK BITE 60FR ADLT L/F GRN (MISCELLANEOUS) ×3 IMPLANT
CANISTER SUCT 1200ML W/VALVE (MISCELLANEOUS) ×3 IMPLANT
FORCEPS BIOP RAD 4 LRG CAP 4 (CUTTING FORCEPS) IMPLANT
GOWN CVR UNV OPN BCK APRN NK (MISCELLANEOUS) ×4 IMPLANT
GOWN ISOL THUMB LOOP REG UNIV (MISCELLANEOUS) ×2
SNARE SHORT THROW 13M SML OVAL (MISCELLANEOUS) IMPLANT
TRAP ETRAP POLY (MISCELLANEOUS) IMPLANT
WATER STERILE IRR 250ML POUR (IV SOLUTION) ×3 IMPLANT

## 2018-02-27 NOTE — Anesthesia Procedure Notes (Signed)
Performed by: Senia Even, CRNA Pre-anesthesia Checklist: Patient identified, Emergency Drugs available, Suction available, Timeout performed and Patient being monitored Patient Re-evaluated:Patient Re-evaluated prior to induction Oxygen Delivery Method: Nasal cannula Placement Confirmation: positive ETCO2       

## 2018-02-27 NOTE — Transfer of Care (Signed)
Immediate Anesthesia Transfer of Care Note  Patient: Tanya Nguyen  Procedure(s) Performed: COLONOSCOPY WITH PROPOFOL (N/A Rectum) ESOPHAGOGASTRODUODENOSCOPY (EGD) WITH PROPOFOL (N/A Throat)  Patient Location: PACU  Anesthesia Type: General  Level of Consciousness: awake, alert  and patient cooperative  Airway and Oxygen Therapy: Patient Spontanous Breathing and Patient connected to supplemental oxygen  Post-op Assessment: Post-op Vital signs reviewed, Patient's Cardiovascular Status Stable, Respiratory Function Stable, Patent Airway and No signs of Nausea or vomiting  Post-op Vital Signs: Reviewed and stable  Complications: No apparent anesthesia complications

## 2018-02-27 NOTE — H&P (Signed)
[name], MD [facility] [address], [place], [place] [address] Phone:[phone] Fax : [phone]  Primary Care Physician:  Pleas Koch, NP Primary Gastroenterologist:  Dr. Allen Norris  Pre-Procedure History & Physical: HPI:  Tanya Nguyen is a 52 y.o. female is here for an endoscopy.   Past Medical History:  Diagnosis Date  . Bleeding duodenal ulcer   . Depression   . GERD (gastroesophageal reflux disease)   . Hypothyroidism   . Kidney stones   . Migraine headache    only1-2x/month since starting topamax    Past Surgical History:  Procedure Laterality Date  . ABDOMINAL HYSTERECTOMY  2004   partial  . CHOLECYSTECTOMY    . ESOPHAGOGASTRODUODENOSCOPY (EGD) WITH PROPOFOL N/A 01/12/2018   Procedure: ESOPHAGOGASTRODUODENOSCOPY (EGD) WITH BIOPSIES;  Surgeon: [name], MD;  Location: [facility];  Service: Endoscopy;  Laterality: N/A;  . ESOPHAGOGASTRODUODENOSCOPY (EGD) WITH PROPOFOL N/A 01/25/2018   Procedure: ESOPHAGOGASTRODUODENOSCOPY (EGD) WITH PROPOFOL;  Surgeon: Jonathon Bellows, MD;  Location[facility] END[facility]COPY;  Service: Gastroenterology;  Laterality: N/A;  . LAPAROTOMY N/A 01/20/2018   Procedure: EXPLORATORY LAPAROTOMY;  Surgeon: Olean Ree, MD;  Location: [facility];  Service: General;  Laterality: N/A;  . TOOTH EXTRACTION      Prior to Admission medications   Medication Sig Start Date End Date Taking? Authorizing Provider  cetirizine (ZYRTEC) 10 MG tablet Take 10 mg by mouth daily as needed for allergies.    Yes [provider]  levothyroxine (SYNTHROID, LEVOTHROID) 100 MCG tablet Take 1 tablet by mouth every morning on an empty stomach with water. No food or other medications for 30 minutes. 12/21/17  Yes Pleas Koch, NP  omeprazole (PRILOSEC) 40 MG capsule Take 1 capsule (40 mg total) by mouth every morning. 08/26/17  Yes Copland, Frederico Hamman, MD  ondansetron (ZOFRAN ODT) 4 MG disintegrating tablet Take 1 tablet (4 mg total) by mouth every 8  (eight) hours as needed for nausea or vomiting. Take 1 tablet 4 mg total  every 6 hours. 02/22/18  Yes Piscoya, Jacqulyn Bath, MD  ondansetron (ZOFRAN) 4 MG tablet Take 1 tablet (4 mg total) by mouth every 8 (eight) hours as needed for nausea or vomiting. 02/06/18  Yes Pabon, Diego F, MD  oxyCODONE (ROXICODONE) 5 MG immediate release tablet Take 1 tablet (5 mg total) by mouth every 6 (six) hours as needed for severe pain. 02/17/18  Yes Piscoya, Jacqulyn Bath, MD  pantoprazole (PROTONIX) 40 MG tablet Take 1 tablet (40 mg total) by mouth 2 (two) times daily. 01/13/18  Yes [name], MD  SUMAtriptan (IMITREX) 25 MG tablet TAKE 1 TABLET EVERY 2 HOURS AS NEEDED FOR MIGRAINE, MAY REPEAT IN 2 HOURS IF HEADACHE PERSISTS OR RECURS 11/16/17  Yes Pleas Koch, NP  topiramate (TOPAMAX) 25 MG tablet Take 1 tablet by mouth at bedtime for migraine prevention. 12/06/17  Yes Pleas Koch, NP  traZODone (DESYREL) 150 MG tablet Take 1 tablet (150 mg total) by mouth at bedtime. 06/21/17  Yes Pleas Koch, NP  NONFORMULARY OR COMPOUNDED ITEM See pharmacy note 08/04/17   Edrick Kins, DPM  ranitidine (ZANTAC) 150 MG tablet Take 1 tablet by mouth at bedtime Patient not taking: Reported on 02/16/2018 08/26/17   Copland, Frederico Hamman, MD  sucralfate (CARAFATE) 1 GM/10ML suspension Take 10 mLs (1 g total) by mouth 4 (four) times daily -  with meals and at bedtime. Patient not taking: Reported on 02/16/2018 02/06/18   Jules Husbands, MD    Allergies as  of 02/14/2018 - Review Complete 02/14/2018  Allergen Reaction Noted  . Penicillins Shortness Of Breath, Diarrhea, and Nausea And Vomiting 02/26/2015  . Sulfa antibiotics Nausea And Vomiting 06/26/2012    Family History  Problem Relation Age of Onset  . Cancer Mother   . Breast cancer Mother 56  . Cancer Father   . Alcohol abuse Sister   . Bipolar disorder Sister   . Alcohol abuse Maternal Grandmother   . Stroke Maternal Grandmother   . Stroke Paternal Grandmother      Social History   Socioeconomic History  . Marital status: Married    Spouse name: Not on file  . Number of children: Not on file  . Years of education: Not on file  . Highest education level: Not on file  Occupational History  . Not on file  Social Needs  . Financial resource strain: Not on file  . Food insecurity:    Worry: Not on file    Inability: Not on file  . Transportation needs:    Medical: Not on file    Non-medical: Not on file  Tobacco Use  . Smoking status: Never Smoker  . Smokeless tobacco: Never Used  Substance and Sexual Activity  . Alcohol use: Not Currently    Alcohol/week: 0.0 standard drinks  . Drug use: No  . Sexual activity: Yes    Birth control/protection: None  Lifestyle  . Physical activity:    Days per week: Not on file    Minutes per session: Not on file  . Stress: Not on file  Relationships  . Social connections:    Talks on phone: Not on file    Gets together: Not on file    Attends religious service: Not on file    Active member of club or organization: Not on file    Attends meetings of clubs or organizations: Not on file    Relationship status: Not on file  . Intimate partner violence:    Fear of current or ex partner: Not on file    Emotionally abused: Not on file    Physically abused: Not on file    Forced sexual activity: Not on file  Other Topics Concern  . Not on file  Social History Narrative  . Not on file    Review of Systems: See HPI, otherwise negative ROS  Physical Exam: BP 123/75   Pulse 71   Temp 97.7 F (36.5 C) (Temporal)   Resp 17   Ht 4\' 7"  (1.397 m)   Wt 41.3 kg   SpO2 99%   BMI 21.15 kg/m  General:   Alert,  pleasant and cooperative in NAD Head:  Normocephalic and atraumatic. Neck:  Supple; no masses or thyromegaly. Lungs:  Clear throughout to auscultation.    Heart:  Regular rate and rhythm. Abdomen:  Soft, nontender and nondistended. Normal bowel sounds, without guarding, and without  rebound.   Neurologic:  Alert and  oriented x4;  grossly normal neurologically.  Impression/Plan: Tanya Nguyen is here for an endoscopy to be performed for nausea and vomiting and screening.  Risks, benefits, limitations, and alternatives regarding  endoscopy and colonoscopy have been reviewed with the patient.  Questions have been answered.  All parties agreeable.   [name], MD  02/27/2018, 9:27 AM

## 2018-02-27 NOTE — Op Note (Signed)
[facility] Gastroenterology Patient Name: Tanya Nguyen Procedure Date: 02/27/2018 10:01 AM MRN: [id number] Account #: [id number] Date of Birth: [birth year] Admit Type: Outpatient Age: 52 Room: [facility] OR ROOM 01 Gender: Female Note Status: Finalized Procedure:            Upper GI endoscopy Indications:          Nausea with vomiting Providers:            [name] MD, MD Referring MD:         Pleas Koch (Referring MD) Medicines:            Propofol per Anesthesia Complications:        No immediate complications. Procedure:            Pre-Anesthesia Assessment:                       - Prior to the procedure, a History and Physical was                        performed, and patient medications and allergies were                        reviewed. The patient's tolerance of previous                        anesthesia was also reviewed. The risks and benefits of                        the procedure and the sedation options and risks were                        discussed with the patient. All questions were                        answered, and informed consent was obtained. Prior                        Anticoagulants: The patient has taken no previous                        anticoagulant or antiplatelet agents. ASA Grade                        Assessment: II - A patient with mild systemic disease.                        After reviewing the risks and benefits, the patient was                        deemed in satisfactory condition to undergo the                        procedure.                       After obtaining informed consent, the endoscope was                        passed under direct vision. Throughout the procedure,  the patient's blood pressure, pulse, and oxygen                        saturations were monitored continuously. The was                        introduced through the mouth, and advanced to the   jejunum. The upper GI endoscopy was accomplished                        without difficulty. The patient tolerated the procedure                        well. Findings:      The examined esophagus was normal.      Evidence of a gastrojejunostomy was found in the gastric body.      A benign-appearing, intrinsic severe stenosis was found at the pylorus.       This was non-traversed.      The examined jejunum was normal. Impression:           - Normal esophagus.                       - A gastrojejunostomy was found.                       - Gastric stenosis was found at the pylorus.                       - Normal examined jejunum.                       - No specimens collected. Recommendation:       - Discharge patient to home.                       - Resume previous diet.                       - Continue present medications. Procedure Code(s):    --- Professional ---                       [phone], Esophagogastroduodenoscopy, flexible, transoral;                        diagnostic, including collection of specimen(s) by                        brushing or washing, when performed (separate procedure) Diagnosis Code(s):    --- Professional ---                       R11.2, Nausea with vomiting, unspecified                       K31.1, Adult hypertrophic pyloric stenosis                       Z98.0, Intestinal bypass and anastomosis status CPT copyright 2018 American Medical Association. All rights reserved. The codes documented in this report are preliminary and upon coder review may  be revised to meet current compliance requirements. [name] MD, MD 02/27/2018 10:16:46 AM This report has  been signed electronically. Number of Addenda: 0 Note Initiated On: 02/27/2018 10:01 AM Total Procedure Duration: 0 hours 7 minutes 5 seconds       [facility]

## 2018-02-27 NOTE — Anesthesia Postprocedure Evaluation (Signed)
Anesthesia Post Note  Patient: Tanya Nguyen  Procedure(s) Performed: COLONOSCOPY WITH PROPOFOL (N/A Rectum) ESOPHAGOGASTRODUODENOSCOPY (EGD) WITH PROPOFOL (N/A Throat)  Patient location during evaluation: PACU Anesthesia Type: General Level of consciousness: awake and alert Pain management: pain level controlled Vital Signs Assessment: post-procedure vital signs reviewed and stable Respiratory status: spontaneous breathing, nonlabored ventilation, respiratory function stable and patient connected to nasal cannula oxygen Cardiovascular status: blood pressure returned to baseline and stable Postop Assessment: no apparent nausea or vomiting Anesthetic complications: no    [place]

## 2018-02-27 NOTE — Anesthesia Preprocedure Evaluation (Signed)
Anesthesia Evaluation  Patient identified by MRN, date of birth, ID band  Reviewed: NPO status   History of Anesthesia Complications Negative for: history of anesthetic complications  Airway Mallampati: II  TM Distance: >3 FB Neck ROM: full    Dental no notable dental hx.    Pulmonary neg pulmonary ROS,    Pulmonary exam normal        Cardiovascular Exercise Tolerance: Good negative cardio ROS Normal cardiovascular exam     Neuro/Psych  Headaches, PSYCHIATRIC DISORDERS (ptsd) Anxiety Depression abd chronic pain     GI/Hepatic Neg liver ROS, PUD (x5), GERD  Controlled,LAPAROTOMY 01/20/2018    Endo/Other  Hypothyroidism   Renal/GU negative Renal ROS  negative genitourinary   Musculoskeletal   Abdominal   Peds  Hematology negative hematology ROS (+)   Anesthesia Other Findings   Reproductive/Obstetrics                             Anesthesia Physical Anesthesia Plan  ASA: II  Anesthesia Plan: General   Post-op Pain Management:    Induction:   PONV Risk Score and Plan:   Airway Management Planned: Natural Airway  Additional Equipment:   Intra-op Plan:   Post-operative Plan:   Informed Consent: I have reviewed the patients History and Physical, chart, labs and discussed the procedure including the risks, benefits and alternatives for the proposed anesthesia with the patient or authorized representative who has indicated his/her understanding and acceptance.     Plan Discussed with: CRNA  Anesthesia Plan Comments:         Anesthesia Quick Evaluation

## 2018-02-27 NOTE — Op Note (Signed)
[facility] Gastroenterology Patient Name: Tanya Nguyen Procedure Date: 02/27/2018 9:56 AM MRN: [id number] Account #: [id number] Date of Birth: 21-Aug-1965 Admit Type: Outpatient Age: 52 Room: [facility] OR ROOM 01 Gender: Female Note Status: Finalized Procedure:            Colonoscopy Indications:          Weight loss Providers:            [name] MD, MD Medicines:            Propofol per Anesthesia Complications:        No immediate complications. Procedure:            Pre-Anesthesia Assessment:                       - Prior to the procedure, a History and Physical was                        performed, and patient medications and allergies were                        reviewed. The patient's tolerance of previous                        anesthesia was also reviewed. The risks and benefits of                        the procedure and the sedation options and risks were                        discussed with the patient. All questions were                        answered, and informed consent was obtained. Prior                        Anticoagulants: The patient has taken no previous                        anticoagulant or antiplatelet agents. ASA Grade                        Assessment: II - A patient with mild systemic disease.                        After reviewing the risks and benefits, the patient was                        deemed in satisfactory condition to undergo the                        procedure.                       After obtaining informed consent, the colonoscope was                        passed under direct vision. Throughout the procedure,                        the patient's blood pressure, pulse, and oxygen  saturations were monitored continuously. The                        Colonoscope was introduced through the anus and                        advanced to the the cecum, identified by appendiceal   orifice and ileocecal valve. The colonoscopy was                        performed without difficulty. The patient tolerated the                        procedure well. The quality of the bowel preparation                        was fair. Findings:      The perianal and digital rectal examinations were normal.      Liquid stool was found in the entire colon, without interference to       visualization.      Internal hemorrhoids were found during retroflexion. The hemorrhoids       were Grade I (internal hemorrhoids that do not prolapse). Impression:           - Preparation of the colon was fair.                       - Stool in the entire examined colon.                       - Internal hemorrhoids.                       - No specimens collected. Recommendation:       - Discharge patient to home.                       - Resume previous diet.                       - Continue present medications.                       - Repeat colonoscopy in 10 years for screening unless                        any change in family history or lower GI problems. Procedure Code(s):    --- Professional ---                       [phone], Colonoscopy, flexible; diagnostic, including                        collection of specimen(s) by brushing or washing, when                        performed (separate procedure) Diagnosis Code(s):    --- Professional ---                       R63.4, Abnormal weight loss CPT copyright 2018 American Medical Association. All rights reserved. The codes documented in this report are preliminary and upon coder review may  be revised to meet current compliance requirements. [name] MD, MD 02/27/2018 10:34:50 AM This report has been signed electronically. Number of Addenda: 0 Note Initiated On: 02/27/2018 9:56 AM Scope Withdrawal Time: 0 hours 9 minutes 9 seconds  Total Procedure Duration: 0 hours 15 minutes 14 seconds       [facility]

## 2018-03-03 ENCOUNTER — Encounter: Payer: Self-pay | Admitting: Surgery

## 2018-03-03 ENCOUNTER — Other Ambulatory Visit: Payer: Self-pay

## 2018-03-03 ENCOUNTER — Ambulatory Visit (INDEPENDENT_AMBULATORY_CARE_PROVIDER_SITE_OTHER): Payer: BLUE CROSS/BLUE SHIELD | Admitting: Surgery

## 2018-03-03 VITALS — BP 114/70 | HR 70 | Temp 98.0°F | Resp 12 | Ht 60.0 in | Wt 94.0 lb

## 2018-03-03 DIAGNOSIS — K311 Adult hypertrophic pyloric stenosis: Secondary | ICD-10-CM

## 2018-03-03 DIAGNOSIS — Z09 Encounter for follow-up examination after completed treatment for conditions other than malignant neoplasm: Secondary | ICD-10-CM

## 2018-03-03 MED ORDER — ONDANSETRON 4 MG PO TBDP: 4.0000 mg | ORAL_TABLET | Freq: Three times a day (TID) | ORAL | 0 refills | Status: DC | PRN

## 2018-03-03 MED ORDER — GABAPENTIN 100 MG PO CAPS: 100.0000 mg | ORAL_CAPSULE | Freq: Three times a day (TID) | ORAL | 0 refills | Status: DC

## 2018-03-03 NOTE — Patient Instructions (Addendum)
You have been scheduled for an Upper GI study at [facility] on 03/07/18 at 10:30 am. You will arrive by 10:15 am and go to the first desk on the right in the [place]. You will have nothing to eat or drink after midnight.   Rx sent. The patient is aware to call back for any questions or concerns.

## 2018-03-03 NOTE — Telephone Encounter (Signed)
error 

## 2018-03-03 NOTE — Progress Notes (Signed)
03/03/2018  HPI: Tanya Nguyen is a 52 y.o. female s/p exlap with gastrojejunostomy with omega loop for gastric outlet obstruction on 11/15.  She presents for follow up.  She had an EGD and colonoscopy with Dr. Allen Norris which were unremarkable except for the known pyloric stenosis, but anastomosis was patent with healthy jejunum.  However, patient still has issues with intermittent nausea and continues to have some pain at the incision.  Vital signs: BP 114/70   Pulse 70   Temp 98 F (36.7 C) (Skin)   Resp 12   Ht 5' (1.524 m)   Wt 94 lb (42.6 kg)   SpO2 98%   BMI 18.36 kg/m    Physical Exam: Constitutional: No acute distress Abdomen:  Soft, nondistended, with some tenderness in the upper portion of the midline wound.  No evidence of hernia.  Fascial ridge caused by suture line is palpable.  Assessment/Plan: This is a 52 y.o. female s/p gastrojejunostomy with omega loop for gastric outlet obstruction.  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  Will order an UGI with SBFT to determine any issues with motility. --Discussed with patient that likely the pain she's having is related to the suture line itself.  Though this should improve, will take time as the sutures dissolve.  --Will give new rx for Zofran and new rx for Gabapentin to see if this can help her pain. --will follow up after study done to discuss result and further plans.   Melvyn Neth, [facility]

## 2018-03-06 ENCOUNTER — Encounter: Payer: Self-pay | Admitting: Internal Medicine

## 2018-03-06 ENCOUNTER — Ambulatory Visit: Payer: BLUE CROSS/BLUE SHIELD | Admitting: Internal Medicine

## 2018-03-06 ENCOUNTER — Other Ambulatory Visit: Payer: Self-pay | Admitting: *Deleted

## 2018-03-06 DIAGNOSIS — G43001 Migraine without aura, not intractable, with status migrainosus: Secondary | ICD-10-CM | POA: Diagnosis not present

## 2018-03-06 MED ORDER — KETOROLAC TROMETHAMINE 30 MG/ML IJ SOLN
30.0000 mg | Freq: Once | INTRAMUSCULAR | Status: AC
  Administered 2018-03-06: 30 mg via INTRAMUSCULAR

## 2018-03-06 MED ORDER — SUMATRIPTAN SUCCINATE 25 MG PO TABS: ORAL_TABLET | ORAL | 0 refills | Status: DC

## 2018-03-06 NOTE — Telephone Encounter (Signed)
Exploratory Laparotomy 01/2018. Patient is requesting refill on pain medication.  Per Dr.Piscoya no more narcotics. Try Tylenol.

## 2018-03-06 NOTE — Addendum Note (Signed)
Addended by: Lurlean Nanny on: 03/06/2018 11:31 AM   Modules accepted: Orders

## 2018-03-06 NOTE — Telephone Encounter (Signed)
Patient's husband called the office back and was notified accordingly.   He verbalizes understanding.

## 2018-03-06 NOTE — Progress Notes (Signed)
Subjective:    Patient ID: Tanya Nguyen, female    DOB: [birth year], 52 y.o.   MRN: [id number]  HPI  Pt presents to the clinic today with c/o migraine. She reports this migraine started 5-6 days ago. The headache is located right side of her head. She describes the pain as throbbing. She reports associated visual changes, dizziness, sensitivity to light, sound, nausea and vomiting. She has not taken anything OTC for this.  She typically has 2 migraines per month. 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 She is currently managed on Topamax for prevention and Imitrex for breakthrough but she reports she ran out of her medication as was told she would have to be seen for refills.  Review of Systems      Past Medical History:  Diagnosis Date  . Bleeding duodenal ulcer   . Depression   . GERD (gastroesophageal reflux disease)   . Hypothyroidism   . Kidney stones   . Migraine headache    only1-2x/month since starting topamax    Current Outpatient Medications  Medication Sig Dispense Refill  . cetirizine (ZYRTEC) 10 MG tablet Take 10 mg by mouth daily as needed for allergies.     Marland Kitchen gabapentin (NEURONTIN) 100 MG capsule Take 1 capsule (100 mg total) by mouth 3 (three) times daily. 90 capsule 0  . levothyroxine (SYNTHROID, LEVOTHROID) 100 MCG tablet Take 1 tablet by mouth every morning on an empty stomach with water. No food or other medications for 30 minutes. 90 tablet 3  . NONFORMULARY OR COMPOUNDED ITEM See pharmacy note 120 each 2  . ondansetron (ZOFRAN ODT) 4 MG disintegrating tablet Take 1 tablet (4 mg total) by mouth every 8 (eight) hours as needed for nausea or vomiting. 20 tablet 0  . ondansetron (ZOFRAN) 4 MG tablet Take 1 tablet (4 mg total) by mouth every 8 (eight) hours as needed for nausea or vomiting. 20 tablet 0  . oxyCODONE (ROXICODONE) 5 MG immediate release tablet Take 1 tablet (5 mg total) by mouth every 6 (six) hours as needed for  severe pain. 20 tablet 0  . pantoprazole (PROTONIX) 40 MG tablet Take 1 tablet (40 mg total) by mouth 2 (two) times daily. 60 tablet 11  . SUMAtriptan (IMITREX) 25 MG tablet TAKE 1 TABLET EVERY 2 HOURS AS NEEDED FOR MIGRAINE, MAY REPEAT IN 2 HOURS IF HEADACHE PERSISTS OR RECURS 10 tablet 0  . topiramate (TOPAMAX) 25 MG tablet Take 1 tablet by mouth at bedtime for migraine prevention. 90 tablet 1  . traZODone (DESYREL) 150 MG tablet Take 1 tablet (150 mg total) by mouth at bedtime. 90 tablet 2   No current facility-administered medications for this visit.     Allergies  Allergen Reactions  . Penicillins Shortness Of Breath, Diarrhea and Nausea And Vomiting    Has patient had a PCN reaction causing immediate rash, facial/tongue/throat swelling, SOB or lightheadedness with hypotension: yes Has patient had a PCN reaction causing severe rash involving mucus membranes or skin necrosis: no Has patient had a PCN reaction that required hospitalization no Has patient had a PCN reaction occurring within the last 10 years: about 10 years If all of the above answers are "NO", then may proceed with Cephalosporin use.   . Sulfa Antibiotics Nausea And Vomiting    Other reaction(s): VOMITING    Family History  Problem Relation Age of Onset  . Cancer Mother   . Breast cancer Mother 63  .  Cancer Father   . Alcohol abuse Sister   . Bipolar disorder Sister   . Alcohol abuse Maternal Grandmother   . Stroke Maternal Grandmother   . Stroke Paternal Grandmother     Social History   Socioeconomic History  . Marital status: Married    Spouse name: Not on file  . Number of children: Not on file  . Years of education: Not on file  . Highest education level: Not on file  Occupational History  . Not on file  Social Needs  . Financial resource strain: Not on file  . Food insecurity:    Worry: Not on file    Inability: Not on file  . Transportation needs:    Medical: Not on file    Non-medical: Not  on file  Tobacco Use  . Smoking status: Never Smoker  . Smokeless tobacco: Never Used  Substance and Sexual Activity  . Alcohol use: Not Currently    Alcohol/week: 0.0 standard drinks  . Drug use: No  . Sexual activity: Yes    Birth control/protection: None  Lifestyle  . Physical activity:    Days per week: Not on file    Minutes per session: Not on file  . Stress: Not on file  Relationships  . Social connections:    Talks on phone: Not on file    Gets together: Not on file    Attends religious service: Not on file    Active member of club or organization: Not on file    Attends meetings of clubs or organizations: Not on file    Relationship status: Not on file  . Intimate partner violence:    Fear of current or ex partner: Not on file    Emotionally abused: Not on file    Physically abused: Not on file    Forced sexual activity: Not on file  Other Topics Concern  . Not on file  Social History Narrative  . Not on file     Constitutional: Pt reports headache. Denies fever, malaise, fatigue, or abrupt weight changes.  HEENT: Pt reports sensitivity to light and sound. Denies eye pain, eye redness, ear pain, ringing in the ears, wax buildup, runny nose, nasal congestion, bloody nose, or sore throat. Respiratory: Denies difficulty breathing, shortness of breath, cough or sputum production.   Cardiovascular: Denies chest pain, chest tightness, palpitations or swelling in the hands or feet.  Gastrointestinal: Pt reports nausea and vomiting. Denies abdominal pain, bloating, constipation, diarrhea or blood in the stool.  Neurological: Pt reports dizziness. Denies difficulty with memory, difficulty with speech or problems with balance and coordination.    No other specific complaints in a complete review of systems (except as listed in HPI above).  Objective:   Physical Exam  BP 110/66   Pulse 88   Temp 98.2 F (36.8 C) (Oral)   Wt 95 lb (43.1 kg)   SpO2 98%   BMI 18.55  kg/m  Wt Readings from Last 3 Encounters:  03/06/18 95 lb (43.1 kg)  03/03/18 94 lb (42.6 kg)  02/27/18 91 lb (41.3 kg)    General: Appears her stated age, well developed, well nourished in NAD. SHEENT: Head: normal shape and size; Eyes: sclera white, no icterus, conjunctiva pink, PERRLA and EOMs intact;  Abdomen: Soft and nontender. Normal bowel sounds. No distention or masses noted. Scarring noted of midline abdomen. Neurological: Alert and oriented. Coordination normal.    BMET    Component Value Date/Time   NA 139  01/26/2018 0501   NA 140 04/08/2014 1913   K 3.8 01/26/2018 0501   K 3.8 04/08/2014 1913   CL 102 01/26/2018 0501   CL 103 04/08/2014 1913   CO2 31 01/26/2018 0501   CO2 33 (H) 04/08/2014 1913   GLUCOSE 117 (H) 01/26/2018 0501   GLUCOSE 92 04/08/2014 1913   BUN 14 01/26/2018 0501   BUN 19 (H) 04/08/2014 1913   CREATININE 0.52 01/26/2018 0501   CREATININE 0.98 04/08/2014 1913   CALCIUM 7.9 (L) 01/26/2018 0501   CALCIUM 9.2 04/08/2014 1913   GFRNONAA >60 01/26/2018 0501   GFRNONAA >60 04/08/2014 1913   GFRAA >60 01/26/2018 0501   GFRAA >60 04/08/2014 1913    Lipid Panel     Component Value Date/Time   CHOL 216 (H) 09/24/2016 1050   TRIG 40 01/29/2018 0506   HDL 73.40 09/24/2016 1050   CHOLHDL 3 09/24/2016 1050   VLDL 10.6 09/24/2016 1050   LDLCALC 132 (H) 09/24/2016 1050    CBC    Component Value Date/Time   WBC 8.2 01/30/2018 0453   RBC 2.99 (L) 01/30/2018 0453   HGB 8.5 (L) 01/30/2018 0453   HGB 13.6 04/08/2014 1913   HCT 26.5 (L) 01/30/2018 0453   HCT 40.9 04/08/2014 1913   PLT 510 (H) 01/30/2018 0453   PLT 233 04/08/2014 1913   MCV 88.6 01/30/2018 0453   MCV 91 04/08/2014 1913   MCH 28.4 01/30/2018 0453   MCHC 32.1 01/30/2018 0453   RDW 13.1 01/30/2018 0453   RDW 13.6 04/08/2014 1913   LYMPHSABS 0.8 01/25/2018 0331   LYMPHSABS 2.0 04/08/2014 1913   MONOABS 0.8 01/25/2018 0331   MONOABS 0.4 04/08/2014 1913   EOSABS 0.3 01/25/2018  0331   EOSABS 0.1 04/08/2014 1913   BASOSABS 0.1 01/25/2018 0331   BASOSABS 0.0 04/08/2014 1913    Hgb A1C No results found for: HGBA1C          Assessment & Plan:   Migraine:  60 mg Toradol IM She will continue Topamax as prescribed- she reports she has not run out of this only Imitrex Imitrex refilled today Try to go home, drink plenty of water, rest in a dark room Return/ER precautions discussed  Webb Silversmith, NP

## 2018-03-06 NOTE — Patient Instructions (Signed)

## 2018-03-06 NOTE — Telephone Encounter (Signed)
Patient called back and stated that she is wanting to see if she can get more pain medication, she is still in a lot of pain. Please call and advise

## 2018-03-07 ENCOUNTER — Ambulatory Visit
Admission: RE | Admit: 2018-03-07 | Discharge: 2018-03-07 | Disposition: A | Discharge status: Home / Self Care | Payer: BLUE CROSS/BLUE SHIELD | Source: Ambulatory Visit | Attending: Surgery | Admitting: Surgery

## 2018-03-07 DIAGNOSIS — K311 Adult hypertrophic pyloric stenosis: Secondary | ICD-10-CM | POA: Insufficient documentation

## 2018-03-13 ENCOUNTER — Telehealth: Payer: Self-pay | Admitting: *Deleted

## 2018-03-13 NOTE — Telephone Encounter (Signed)
Left message letting patient know she will need to wait until she is seen by Dr Hampton Abbot tomorrow morning in [facility] for another refill on this medication.

## 2018-03-13 NOTE — Telephone Encounter (Signed)
Patient called the office wanting to know if she can have a refill on the nausea medication-ondansetron 4 mg.   The patient called last week and wanted pain medication. Per Dr. Hampton Abbot she was supposed to try Tylenol.   Patient states Tylenol does not work and wants refill nausea medication.   The patient is scheduled to see Dr. Hampton Abbot for follow up tomorrow in [place].

## 2018-03-14 ENCOUNTER — Other Ambulatory Visit: Payer: Self-pay

## 2018-03-14 ENCOUNTER — Encounter: Payer: Self-pay | Admitting: Surgery

## 2018-03-14 ENCOUNTER — Ambulatory Visit: Payer: BLUE CROSS/BLUE SHIELD | Admitting: Surgery

## 2018-03-14 VITALS — BP 105/64 | HR 76 | Temp 95.5°F | Resp 16 | Ht 59.0 in | Wt 93.0 lb

## 2018-03-14 DIAGNOSIS — K311 Adult hypertrophic pyloric stenosis: Secondary | ICD-10-CM

## 2018-03-14 DIAGNOSIS — Z09 Encounter for follow-up examination after completed treatment for conditions other than malignant neoplasm: Secondary | ICD-10-CM

## 2018-03-14 NOTE — Patient Instructions (Signed)
We sent the referrals to Dr.Wohl Gastroenterologist and to the pain clinic.  Someone from both offices should contact you within 5-7 days.  Please call our office if you do not hear from anyone within the time frame listed above.

## 2018-03-14 NOTE — Progress Notes (Signed)
03/14/2018  HPI: Tanya Nguyen is a 53 y.o. 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  She presents for follow up.  She had an UGI with SBFT and presents to discuss results.  Her study was negative for any obstruction, ulceration, and her GJ was patent.  Patient still reports nausea and intermittent pain at the upper portion of the incision as well as constipation.  Vital signs: BP 105/64   Pulse 76   Temp (!) 95.5 F (35.3 C) (Other (Comment))   Resp 16   Ht 4\' 11"  (1.499 m)   Wt 93 lb (42.2 kg)   SpO2 100%   BMI 18.78 kg/m    Physical Exam: Constitutional: No acute distress Abdomen:  Soft, nondistended, nontender.  Incision well healed, clean, dry, intact.  Assessment/Plan: This is a 53 y.o. female s/p gastrojejunostomy with omega loop  --Reviewed results with patient.  Overall there is no surgical reason for her nausea and pain.  She may be having pain due to the suture at the fascial level, which I have reassured patient should continue to improve if it is the suture itself.  No surgical reason for her nausea or constipation. --will refer to gastroenterology with Dr. Allen Norris for further evaluation of her constipation and nausea.  Will refer to pain clinic for postop pain control.  At this point 1.5 months out of surgery, there is no acute reason and likely either sutures or the patient's chronic pain history. --follow up prn.   Melvyn Neth, [facility]

## 2018-03-15 ENCOUNTER — Ambulatory Visit: Payer: Self-pay | Admitting: Primary Care

## 2018-03-24 ENCOUNTER — Other Ambulatory Visit: Payer: Self-pay | Admitting: Surgery

## 2018-03-28 ENCOUNTER — Ambulatory Visit: Payer: Self-pay

## 2018-03-28 NOTE — Telephone Encounter (Signed)
Pt c/o pain to her left side for 9 days. Pt stated she climbed up to her attic and twisted around some wood and felt it hit her hip bone. Ever since, she has had a constant burning pain. Pt thinks she has pulled a muscle.  Pt denies fever, abdominal pain, vomiting, leg weakness, burning with urination and blood in urine.  Home care advice given and pt wanting appointment to be seen. No appointments available with PCP. Pt given appointment tomorrow with Dr Silvio Pate.  Reason for Disposition . Mild flank pain from a twisting, bending or lifting injury  Answer Assessment - Initial Assessment Questions 1. LOCATION: "Where does it hurt?" (e.g., left, right)     Left side  2. ONSET: "When did the pain start?"     9 days 3. SEVERITY: "How bad is the pain?" (e.g., Scale 1-10; mild, moderate, or severe)   - MILD (1-3): doesn't interfere with normal activities    - MODERATE (4-7): interferes with normal activities or awakens from sleep    - SEVERE (8-10): excruciating pain and patient unable to do normal activities (stays in bed)       Moderate to severe like a burning pain 4. PATTERN: "Does the pain come and go, or is it constant?"      constant 5. CAUSE: "What do you think is causing the pain?"     Pulled a muscle  Was in the attic and leaned over the wood in the attic and felt it hit the hip bone 6. OTHER SYMPTOMS:  "Do you have any other symptoms?" (e.g., fever, abdominal pain, vomiting, leg weakness, burning with urination, blood in urine)    no 7. PREGNANCY:  "Is there any chance you are pregnant?" "When was your last menstrual period?"     n/a  Protocols used: FLANK PAIN-A-AH

## 2018-03-29 ENCOUNTER — Ambulatory Visit: Payer: Self-pay | Admitting: Internal Medicine

## 2018-03-30 ENCOUNTER — Encounter: Payer: Self-pay | Admitting: Primary Care

## 2018-03-30 ENCOUNTER — Ambulatory Visit (INDEPENDENT_AMBULATORY_CARE_PROVIDER_SITE_OTHER)
Admission: RE | Admit: 2018-03-30 | Discharge: 2018-03-30 | Disposition: A | Discharge status: Home / Self Care | Payer: PRIVATE HEALTH INSURANCE | Source: Ambulatory Visit | Attending: Primary Care | Admitting: Primary Care

## 2018-03-30 ENCOUNTER — Ambulatory Visit (INDEPENDENT_AMBULATORY_CARE_PROVIDER_SITE_OTHER): Payer: PRIVATE HEALTH INSURANCE | Admitting: Primary Care

## 2018-03-30 ENCOUNTER — Other Ambulatory Visit: Payer: Self-pay

## 2018-03-30 ENCOUNTER — Telehealth: Payer: Self-pay | Admitting: Gastroenterology

## 2018-03-30 VITALS — BP 116/80 | HR 113 | Temp 98.2°F | Ht 59.0 in | Wt 91.0 lb

## 2018-03-30 DIAGNOSIS — R109 Unspecified abdominal pain: Secondary | ICD-10-CM

## 2018-03-30 DIAGNOSIS — E038 Other specified hypothyroidism: Secondary | ICD-10-CM | POA: Diagnosis not present

## 2018-03-30 HISTORY — DX: Unspecified abdominal pain: R10.9

## 2018-03-30 MED ORDER — LEVOTHYROXINE SODIUM 100 MCG PO TABS: ORAL_TABLET | ORAL | 2 refills | Status: DC

## 2018-03-30 MED ORDER — PANTOPRAZOLE SODIUM 40 MG PO TBEC: 40.0000 mg | DELAYED_RELEASE_TABLET | Freq: Two times a day (BID) | ORAL | 11 refills | Status: DC

## 2018-03-30 NOTE — Progress Notes (Signed)
Subjective:    Patient ID: Tanya Nguyen, female    DOB: 1965/10/15, 53 y.o.   MRN: [id number]  HPI  Tanya Nguyen is a 53 year old female with a history of duodenal obstruction, GERD, bleeding duodenal ulcer who presents today with a chief complaint of side pain.  She is also needing a refill of her levothyroxine.  Her pain is located to the left lateral side over the bottom portion of her ribs. She was in the attic, leaned onto her left side over a wood structure and felt the pain immediately. She thinks she may have felt a "pop" to the left lower rib. She describes her pain as sharp and tight.   This occurred about 10 days ago and has been painful since. No improvement. She's tried taking Tylenol without improvement. She's tried heat and ice packs with temporary improvement. She has noticed discomfort when taking in a deep breath.   Review of Systems  Respiratory: Negative for shortness of breath.   Gastrointestinal: Negative for abdominal pain.  Musculoskeletal: Positive for arthralgias.  Skin: Negative for color change.       Past Medical History:  Diagnosis Date  . Bleeding duodenal ulcer   . Depression   . GERD (gastroesophageal reflux disease)   . Hypothyroidism   . Kidney stones   . Migraine headache    only1-2x/month since starting topamax     Social History   Socioeconomic History  . Marital status: Married    Spouse name: Not on file  . Number of children: Not on file  . Years of education: Not on file  . Highest education level: Not on file  Occupational History  . Not on file  Social Needs  . Financial resource strain: Not on file  . Food insecurity:    Worry: Not on file    Inability: Not on file  . Transportation needs:    Medical: Not on file    Non-medical: Not on file  Tobacco Use  . Smoking status: Never Smoker  . Smokeless tobacco: Never Used  Substance and Sexual Activity  . Alcohol use: Not Currently    Alcohol/week: 0.0 standard drinks  .  Drug use: No  . Sexual activity: Yes    Birth control/protection: None  Lifestyle  . Physical activity:    Days per week: Not on file    Minutes per session: Not on file  . Stress: Not on file  Relationships  . Social connections:    Talks on phone: Not on file    Gets together: Not on file    Attends religious service: Not on file    Active member of club or organization: Not on file    Attends meetings of clubs or organizations: Not on file    Relationship status: Not on file  . Intimate partner violence:    Fear of current or ex partner: Not on file    Emotionally abused: Not on file    Physically abused: Not on file    Forced sexual activity: Not on file  Other Topics Concern  . Not on file  Social History Narrative  . Not on file    Past Surgical History:  Procedure Laterality Date  . ABDOMINAL HYSTERECTOMY  2004   partial  . CHOLECYSTECTOMY    . COLONOSCOPY WITH PROPOFOL N/A 02/27/2018   Procedure: COLONOSCOPY WITH PROPOFOL;  Surgeon: [name], MD;  Location: [facility];  Service: Endoscopy;  Laterality: N/A;  . ESOPHAGOGASTRODUODENOSCOPY (  EGD) WITH PROPOFOL N/A 01/12/2018   Procedure: ESOPHAGOGASTRODUODENOSCOPY (EGD) WITH BIOPSIES;  Surgeon: [name], MD;  Location: [place];  Service: Endoscopy;  Laterality: N/A;  . ESOPHAGOGASTRODUODENOSCOPY (EGD) WITH PROPOFOL N/A 01/25/2018   Procedure: ESOPHAGOGASTRODUODENOSCOPY (EGD) WITH PROPOFOL;  Surgeon: Jonathon Bellows, MD;  Location: [facility] ENDOSCOPY;  Service: Gastroenterology;  Laterality: N/A;  . ESOPHAGOGASTRODUODENOSCOPY (EGD) WITH PROPOFOL N/A 02/27/2018   Procedure: ESOPHAGOGASTRODUODENOSCOPY (EGD) WITH PROPOFOL;  Surgeon: [name], MD;  Location: [place];  Service: Endoscopy;  Laterality: N/A;  . LAPAROTOMY N/A 01/20/2018   Procedure: EXPLORATORY LAPAROTOMY;  Surgeon: Olean Ree, MD;  Location: [facility];  Service: General;  Laterality: N/A;  . TOOTH EXTRACTION      Family  History  Problem Relation Age of Onset  . Cancer Mother   . Breast cancer Mother 67  . Cancer Father   . Alcohol abuse Sister   . Bipolar disorder Sister   . Alcohol abuse Maternal Grandmother   . Stroke Maternal Grandmother   . Stroke Paternal Grandmother     Allergies  Allergen Reactions  . Penicillins Shortness Of Breath, Diarrhea and Nausea And Vomiting    Has patient had a PCN reaction causing immediate rash, facial/tongue/throat swelling, SOB or lightheadedness with hypotension: yes Has patient had a PCN reaction causing severe rash involving mucus membranes or skin necrosis: no Has patient had a PCN reaction that required hospitalization no Has patient had a PCN reaction occurring within the last 10 years: about 10 years If all of the above answers are "NO", then may proceed with Cephalosporin use.   . Sulfa Antibiotics Nausea And Vomiting    Other reaction(s): VOMITING    Current Outpatient Medications on File Prior to Visit  Medication Sig Dispense Refill  . cetirizine (ZYRTEC) 10 MG tablet Take 10 mg by mouth daily as needed for allergies.     Marland Kitchen gabapentin (NEURONTIN) 100 MG capsule Take 1 capsule (100 mg total) by mouth 3 (three) times daily. 90 capsule 0  . NONFORMULARY OR COMPOUNDED ITEM See pharmacy note 120 each 2  . ondansetron (ZOFRAN) 4 MG tablet Take 1 tablet (4 mg total) by mouth every 8 (eight) hours as needed for nausea or vomiting. 20 tablet 0  . SUMAtriptan (IMITREX) 25 MG tablet TAKE 1 TABLET EVERY 2 HOURS AS NEEDED FOR MIGRAINE, MAY REPEAT IN 2 HOURS IF HEADACHE PERSISTS OR RECURS 10 tablet 0  . topiramate (TOPAMAX) 25 MG tablet Take 1 tablet by mouth at bedtime for migraine prevention. 90 tablet 1  . traZODone (DESYREL) 150 MG tablet Take 1 tablet (150 mg total) by mouth at bedtime. 90 tablet 2   No current facility-administered medications on file prior to visit.     BP 116/80   Pulse (!) 113   Temp 98.2 F (36.8 C) (Oral)   Ht 4\' 11"  (1.499 m)    Wt 91 lb (41.3 kg)   SpO2 98%   BMI 18.38 kg/m    Objective:   Physical Exam  Constitutional: She appears well-nourished.  Cardiovascular: Normal rate and regular rhythm.  Respiratory: Effort normal and breath sounds normal.  Musculoskeletal:       Back:     Comments: Tenderness to left lateral side, also over left ribs 9-10  Skin: Skin is warm and dry. No erythema.           Assessment & Plan:

## 2018-03-30 NOTE — Patient Instructions (Signed)
Complete xray(s) prior to leaving today. I will notify you of your results once received.  Make sure to take deep breaths to keep the lungs inflated.   It was a pleasure to see you today!

## 2018-03-30 NOTE — Telephone Encounter (Signed)
Pt is calling she needs her phamacy changed because [name] out business she needs rx Pantotravole SOD 40 mg ( ulcer meds) to [facility] [address]

## 2018-03-30 NOTE — Assessment & Plan Note (Signed)
To left lateral side since leaning over a wood structure in her attic. Exam today with tenderness upon mild palpation.  No obvious contusions or deformity. X-ray of left lateral ribs pending. Continue heat/ice, Tylenol as needed.

## 2018-03-30 NOTE — Telephone Encounter (Signed)
Prescription for [facility]ole [facility] [address] due to [facility] closing. Pt notified rx was sent.

## 2018-05-01 ENCOUNTER — Telehealth: Payer: Self-pay | Admitting: Primary Care

## 2018-05-01 DIAGNOSIS — G43701 Chronic migraine without aura, not intractable, with status migrainosus: Secondary | ICD-10-CM

## 2018-05-01 MED ORDER — TOPIRAMATE 25 MG PO TABS: ORAL_TABLET | ORAL | 1 refills | Status: DC

## 2018-05-01 NOTE — Telephone Encounter (Signed)
Pt called to request a refill of Topiramate [facility] [place]. She has about 4 pills left.

## 2018-05-01 NOTE — Telephone Encounter (Signed)
Refill sent as requested. 

## 2018-05-03 ENCOUNTER — Ambulatory Visit (INDEPENDENT_AMBULATORY_CARE_PROVIDER_SITE_OTHER): Payer: PRIVATE HEALTH INSURANCE | Admitting: Primary Care

## 2018-05-03 ENCOUNTER — Encounter: Payer: Self-pay | Admitting: Primary Care

## 2018-05-03 VITALS — BP 116/70 | HR 95 | Temp 97.9°F | Ht 59.0 in | Wt 87.5 lb

## 2018-05-03 DIAGNOSIS — J309 Allergic rhinitis, unspecified: Secondary | ICD-10-CM

## 2018-05-03 DIAGNOSIS — H9203 Otalgia, bilateral: Secondary | ICD-10-CM

## 2018-05-03 MED ORDER — CETIRIZINE HCL 10 MG PO TABS: 10.0000 mg | ORAL_TABLET | Freq: Every day | ORAL | 0 refills | Status: DC | PRN

## 2018-05-03 MED ORDER — FLUTICASONE PROPIONATE 50 MCG/ACT NA SUSP
1.0000 | Freq: Two times a day (BID) | NASAL | 0 refills | Status: DC
Start: 2018-05-03 — End: 2021-12-10

## 2018-05-03 NOTE — Patient Instructions (Signed)
Start taking your Zyrtec again everyday for the next several weeks.  Nasal Congestion/Ear Pressure/Sinus Pressure: Try using Flonase (fluticasone) nasal spray. Instill 1 spray in each nostril twice daily.   Please call me if you develop fevers, chills, body aches.  It was a pleasure to see you today!

## 2018-05-03 NOTE — Progress Notes (Signed)
Subjective:    Patient ID: Tanya Nguyen, female    DOB: [birth year], 53 y.o.   MRN: [id number]  HPI  Tanya Nguyen is a 53 year old female with a history of migraines, GERD, bipolar disorder, fatigue who presents today with a chief complaint of otalgia.  Her pain is located to the bilateral ears which began one week ago. Pain is worse during the night when sleeping, little pain during the day. She describes her pain as burning. Denies hearing loss, fevers, cough, chills. She's not taken anything OTC for her symptoms. She does use q-tips regularly.   Review of Systems  Constitutional: Negative for fever.  HENT: Positive for ear pain. Negative for postnasal drip and sore throat.   Respiratory: Negative for cough.   Cardiovascular: Negative for chest pain.       Past Medical History:  Diagnosis Date  . Bleeding duodenal ulcer   . Depression   . GERD (gastroesophageal reflux disease)   . Hypothyroidism   . Kidney stones   . Migraine headache    only1-2x/month since starting topamax     Social History   Socioeconomic History  . Marital status: Married    Spouse name: Not on file  . Number of children: Not on file  . Years of education: Not on file  . Highest education level: Not on file  Occupational History  . Not on file  Social Needs  . Financial resource strain: Not on file  . Food insecurity:    Worry: Not on file    Inability: Not on file  . Transportation needs:    Medical: Not on file    Non-medical: Not on file  Tobacco Use  . Smoking status: Never Smoker  . Smokeless tobacco: Never Used  Substance and Sexual Activity  . Alcohol use: Not Currently    Alcohol/week: 0.0 standard drinks  . Drug use: No  . Sexual activity: Yes    Birth control/protection: None  Lifestyle  . Physical activity:    Days per week: Not on file    Minutes per session: Not on file  . Stress: Not on file  Relationships  . Social connections:    Talks on phone: Not on file    Gets  together: Not on file    Attends religious service: Not on file    Active member of club or organization: Not on file    Attends meetings of clubs or organizations: Not on file    Relationship status: Not on file  . Intimate partner violence:    Fear of current or ex partner: Not on file    Emotionally abused: Not on file    Physically abused: Not on file    Forced sexual activity: Not on file  Other Topics Concern  . Not on file  Social History Narrative  . Not on file    Past Surgical History:  Procedure Laterality Date  . ABDOMINAL HYSTERECTOMY  2004   partial  . CHOLECYSTECTOMY    . COLONOSCOPY WITH PROPOFOL N/A 02/27/2018   Procedure: COLONOSCOPY WITH PROPOFOL;  Surgeon: [name], MD;  Location: [place];  Service: Endoscopy;  Laterality: N/A;  . ESOPHAGOGASTRODUODENOSCOPY (EGD) WITH PROPOFOL N/A 01/12/2018   Procedure: ESOPHAGOGASTRODUODENOSCOPY (EGD) WITH BIOPSIES;  Surgeon: [name], MD;  Location: [facility];  Service: Endoscopy;  Laterality: N/A;  . ESOPHAGOGASTRODUODENOSCOPY (EGD) WITH PROPOFOL N/A 01/25/2018   Procedure: ESOPHAGOGASTRODUODENOSCOPY (EGD) WITH PROPOFOL;  Surgeon: Jonathon Bellows, MD;  Location: [facility]  ENDOSCOPY;  Service: Gastroenterology;  Laterality: N/A;  . ESOPHAGOGASTRODUODENOSCOPY (EGD) WITH PROPOFOL N/A 02/27/2018   Procedure: ESOPHAGOGASTRODUODENOSCOPY (EGD) WITH PROPOFOL;  Surgeon: [name], MD;  Location: [place];  Service: Endoscopy;  Laterality: N/A;  . LAPAROTOMY N/A 01/20/2018   Procedure: EXPLORATORY LAPAROTOMY;  Surgeon: Olean Ree, MD;  Location: [facility];  Service: General;  Laterality: N/A;  . TOOTH EXTRACTION      Family History  Problem Relation Age of Onset  . Cancer Mother   . Breast cancer Mother 68  . Cancer Father   . Alcohol abuse Sister   . Bipolar disorder Sister   . Alcohol abuse Maternal Grandmother   . Stroke Maternal Grandmother   . Stroke Paternal Grandmother      Allergies  Allergen Reactions  . Penicillins Shortness Of Breath, Diarrhea and Nausea And Vomiting    Has patient had a PCN reaction causing immediate rash, facial/tongue/throat swelling, SOB or lightheadedness with hypotension: yes Has patient had a PCN reaction causing severe rash involving mucus membranes or skin necrosis: no Has patient had a PCN reaction that required hospitalization no Has patient had a PCN reaction occurring within the last 10 years: about 10 years If all of the above answers are "NO", then may proceed with Cephalosporin use.   . Sulfa Antibiotics Nausea And Vomiting    Other reaction(s): VOMITING    Current Outpatient Medications on File Prior to Visit  Medication Sig Dispense Refill  . cetirizine (ZYRTEC) 10 MG tablet Take 10 mg by mouth daily as needed for allergies.     Marland Kitchen levothyroxine (SYNTHROID, LEVOTHROID) 100 MCG tablet Take 1 tablet by mouth every morning on an empty stomach with water. No food or other medications for 30 minutes. 90 tablet 2  . NONFORMULARY OR COMPOUNDED ITEM See pharmacy note 120 each 2  . pantoprazole (PROTONIX) 40 MG tablet Take 1 tablet (40 mg total) by mouth 2 (two) times daily. 60 tablet 11  . SUMAtriptan (IMITREX) 25 MG tablet TAKE 1 TABLET EVERY 2 HOURS AS NEEDED FOR MIGRAINE, MAY REPEAT IN 2 HOURS IF HEADACHE PERSISTS OR RECURS 10 tablet 0  . topiramate (TOPAMAX) 25 MG tablet Take 1 tablet by mouth at bedtime for migraine prevention. 90 tablet 1  . traZODone (DESYREL) 150 MG tablet Take 1 tablet (150 mg total) by mouth at bedtime. 90 tablet 2   No current facility-administered medications on file prior to visit.     BP 116/70   Pulse 95   Temp 97.9 F (36.6 C) (Oral)   Ht 4\' 11"  (1.499 m)   Wt 87 lb 8 oz (39.7 kg)   SpO2 98%   BMI 17.67 kg/m    Objective:   Physical Exam  Constitutional: She appears well-nourished. She does not appear ill.  HENT:  Right Ear: Ear canal normal. Tympanic membrane is bulging.  Tympanic membrane is not erythematous.  Left Ear: Tympanic membrane and ear canal normal. Tympanic membrane is not erythematous and not bulging.  Nose: No mucosal edema. Right sinus exhibits no maxillary sinus tenderness and no frontal sinus tenderness. Left sinus exhibits no maxillary sinus tenderness and no frontal sinus tenderness.  Mouth/Throat: Oropharynx is clear and moist.  Neck: Neck supple.  Cardiovascular: Normal rate and regular rhythm.  Respiratory: Effort normal and breath sounds normal. She has no wheezes.  Skin: Skin is warm and dry.           Assessment & Plan:  Otalgia:  Ear pain x 1  week, no other symptoms. HENT exam unremarkable.  Suspect effusion vs eustachian tube dysfunction and will treat conservatively.  Rx for Flonase sent to pharmacy. Discussed to resume Zyrtec. Return precautions provided.  Pleas Koch, NP

## 2018-05-29 ENCOUNTER — Telehealth: Payer: Self-pay | Admitting: Primary Care

## 2018-05-29 ENCOUNTER — Other Ambulatory Visit: Payer: Self-pay

## 2018-05-29 DIAGNOSIS — F3130 Bipolar disorder, current episode depressed, mild or moderate severity, unspecified: Secondary | ICD-10-CM

## 2018-05-29 DIAGNOSIS — G4709 Other insomnia: Secondary | ICD-10-CM

## 2018-05-29 MED ORDER — SERTRALINE HCL 50 MG PO TABS: 50.0000 mg | ORAL_TABLET | Freq: Every day | ORAL | 1 refills | Status: DC

## 2018-05-29 NOTE — Telephone Encounter (Signed)
Noted, refills sent to pharmacy. 

## 2018-05-29 NOTE — Telephone Encounter (Signed)
Is she still taking sertraline? If not, when did she stop taking? Will you notify her that this was removed from her medication list for some reason. She is also going to be due for for CPE later this year, will you schedule her for June 2020? Thanks.

## 2018-05-29 NOTE — Telephone Encounter (Signed)
This pt is requesting a refill o trazodone and sertraline, I only see the trazodone on her ist. Please advised.  Adair Lauderback,cma

## 2018-05-29 NOTE — Telephone Encounter (Signed)
Pt need refill for    trazodone 150 mg  Sertraline 50 mg    [facility]

## 2018-05-29 NOTE — Telephone Encounter (Signed)
appt scheduled in June for  and pt states she needs the sertraline really bad she does not know why it was removed and [facility] [address]  Nina,cma

## 2018-05-30 MED ORDER — TRAZODONE HCL 150 MG PO TABS: 150.0000 mg | ORAL_TABLET | Freq: Every day | ORAL | 1 refills | Status: DC

## 2018-05-30 NOTE — Telephone Encounter (Signed)
Spoken to patient and no problem. Refills as requested and patient has been notified.

## 2018-05-30 NOTE — Telephone Encounter (Signed)
Pt stated she went to pick up her medications and her trazodone wasn't called in. Please call pt to advise the reason why.

## 2018-05-30 NOTE — Addendum Note (Signed)
Addended by: Jacqualin Combes on: 05/30/2018 03:15 PM   Modules accepted: Orders

## 2018-07-10 ENCOUNTER — Telehealth: Payer: Self-pay | Admitting: Primary Care

## 2018-07-10 DIAGNOSIS — G43001 Migraine without aura, not intractable, with status migrainosus: Secondary | ICD-10-CM

## 2018-07-10 MED ORDER — SUMATRIPTAN SUCCINATE 25 MG PO TABS: ORAL_TABLET | ORAL | 0 refills | Status: DC

## 2018-07-10 NOTE — Telephone Encounter (Signed)
Refill sent as requested. 

## 2018-07-10 NOTE — Telephone Encounter (Signed)
Patient had a migraine over the weekend and she used all of her Sumatriptan 25 mg.  Patient would like a refill [facility].

## 2018-07-10 NOTE — Telephone Encounter (Signed)
Patient has been notified

## 2018-07-22 ENCOUNTER — Other Ambulatory Visit: Payer: Self-pay | Admitting: Primary Care

## 2018-07-22 DIAGNOSIS — J309 Allergic rhinitis, unspecified: Secondary | ICD-10-CM

## 2018-07-25 ENCOUNTER — Encounter: Payer: Self-pay | Admitting: Podiatry

## 2018-07-25 ENCOUNTER — Ambulatory Visit: Payer: PRIVATE HEALTH INSURANCE | Admitting: Podiatry

## 2018-07-25 ENCOUNTER — Other Ambulatory Visit: Payer: Self-pay

## 2018-07-25 ENCOUNTER — Ambulatory Visit (INDEPENDENT_AMBULATORY_CARE_PROVIDER_SITE_OTHER): Payer: PRIVATE HEALTH INSURANCE

## 2018-07-25 VITALS — Temp 97.2°F

## 2018-07-25 DIAGNOSIS — M722 Plantar fascial fibromatosis: Secondary | ICD-10-CM | POA: Diagnosis not present

## 2018-07-25 DIAGNOSIS — G609 Hereditary and idiopathic neuropathy, unspecified: Secondary | ICD-10-CM

## 2018-07-25 MED ORDER — GABAPENTIN 100 MG PO CAPS: 100.0000 mg | ORAL_CAPSULE | Freq: Three times a day (TID) | ORAL | 3 refills | Status: DC

## 2018-07-25 MED ORDER — MELOXICAM 7.5 MG PO TABS: 7.5000 mg | ORAL_TABLET | Freq: Every day | ORAL | 1 refills | Status: DC

## 2018-07-27 NOTE — Progress Notes (Signed)
   Subjective: 53 year old female presenting today for follow up evaluation of bilateral foot pain. She reports continue pain that has not improved since her previous visit. She reports associated redness of the plantar aspects of the feet. Taking gabapentin and meloxicam as well as the injections are helping to improve her symptoms. Patient is here for further evaluation and treatment.   Past Medical History:  Diagnosis Date  . Bleeding duodenal ulcer   . Depression   . GERD (gastroesophageal reflux disease)   . Hypothyroidism   . Kidney stones   . Migraine headache    only1-2x/month since starting topamax     Objective: Physical Exam General: The patient is alert and oriented x3 in no acute distress.  Dermatology: Skin is warm, dry and supple bilateral lower extremities. Negative for open lesions or macerations bilateral.   Vascular: Dorsalis Pedis and Posterior Tibial pulses palpable bilateral.  Capillary fill time is immediate to all digits.  Neurological: Epicritic and protective threshold intact bilateral.   Musculoskeletal: Tenderness to palpation to the plantar aspect of the bilateral heels along the plantar fascia. All other joints range of motion within normal limits bilateral. Strength 5/5 in all groups bilateral.   Radiographic Exam:  Normal osseous mineralization. Joint spaces preserved. No fracture/dislocation/boney destruction.    Assessment: 1. plantar fasciitis bilateral feet 2. Idiopathic peripheral neuropathy bilateral   Plan of Care:  1. Patient evaluated. X-Rays reviewed.  2. Injection of 0.5cc Celestone soluspan injected into the bilateral heels.  3. Prescription for Gabapentin 100 mg TID provided to patient.  4. Continue taking Meloxicam.  5. Continue wearing Brooks shoes. Do not go barefoot.  6. Return to clinic in 4 weeks.    Works on her feet all day at [employer].   Edrick Kins, DPM [facility]  Dr. Edrick Kins, DPM     [address], [place] [address]                Office [phone]  Fax [phone]

## 2018-07-28 ENCOUNTER — Telehealth: Payer: Self-pay

## 2018-07-28 MED ORDER — METHYLPREDNISOLONE 4 MG PO TABS: ORAL_TABLET | ORAL | 0 refills | Status: DC

## 2018-07-28 MED ORDER — TRAMADOL HCL 50 MG PO TABS: 50.0000 mg | ORAL_TABLET | Freq: Four times a day (QID) | ORAL | 0 refills | Status: AC | PRN

## 2018-07-28 NOTE — Telephone Encounter (Signed)
I spoke with Dr. Amalia Hailey regarding patient's message, Per Dr. Amalia Hailey he wants her to start Medrol dose pack, Tramadol for pain, ice, rest and follow up at scheduled appt.   Patient has been notified of Dr. Amalia Hailey instructions and medications have been called into pharmacy

## 2018-07-28 NOTE — Telephone Encounter (Signed)
-----   Message from Arlyce Dice sent at 07/28/2018  9:57 AM EDT ----- Patient called in stating that both feet are still in pain, Burning, Red and asked to have pain medication called in for the pain. Said that she has taken medication that dr.evans prescribed however it is not working for her.

## 2018-08-04 ENCOUNTER — Ambulatory Visit: Payer: PRIVATE HEALTH INSURANCE | Admitting: Podiatry

## 2018-08-04 ENCOUNTER — Other Ambulatory Visit: Payer: Self-pay

## 2018-08-04 ENCOUNTER — Encounter: Payer: Self-pay | Admitting: Podiatry

## 2018-08-04 VITALS — Temp 98.8°F

## 2018-08-04 DIAGNOSIS — G609 Hereditary and idiopathic neuropathy, unspecified: Secondary | ICD-10-CM | POA: Diagnosis not present

## 2018-08-04 DIAGNOSIS — M722 Plantar fascial fibromatosis: Secondary | ICD-10-CM | POA: Diagnosis not present

## 2018-08-04 MED ORDER — GABAPENTIN 300 MG PO CAPS: 300.0000 mg | ORAL_CAPSULE | Freq: Three times a day (TID) | ORAL | 3 refills | Status: DC

## 2018-08-04 NOTE — Patient Instructions (Signed)
Biofreeze

## 2018-08-05 ENCOUNTER — Other Ambulatory Visit: Payer: Self-pay | Admitting: Primary Care

## 2018-08-05 DIAGNOSIS — G43001 Migraine without aura, not intractable, with status migrainosus: Secondary | ICD-10-CM

## 2018-08-07 NOTE — Progress Notes (Signed)
   Subjective: 53 year old female presenting today for follow up evaluation of bilateral foot pain. She reports continued pain. She states her feet are burning and red when she is on them all day. She notes pain that radiates up the legs. She has been taking Gabapentin and Meloxicam as directed. Patient is here for further evaluation and treatment.   Past Medical History:  Diagnosis Date  . Bleeding duodenal ulcer   . Depression   . GERD (gastroesophageal reflux disease)   . Hypothyroidism   . Kidney stones   . Migraine headache    only1-2x/month since starting topamax     Objective: Physical Exam General: The patient is alert and oriented x3 in no acute distress.  Dermatology: Skin is warm, dry and supple bilateral lower extremities. Negative for open lesions or macerations bilateral.   Vascular: Dorsalis Pedis and Posterior Tibial pulses palpable bilateral.  Capillary fill time is immediate to all digits.  Neurological: Epicritic and protective threshold intact bilateral.   Musculoskeletal: Tenderness to palpation to the plantar aspect of the bilateral heels along the plantar fascia. All other joints range of motion within normal limits bilateral. Strength 5/5 in all groups bilateral.   Assessment: 1. plantar fasciitis bilateral feet - improved  2. Idiopathic peripheral neuropathy bilateral   Plan of Care:  1. Patient evaluated.  2. Injection of 0.5cc Celestone soluspan injected into the bilateral heels.  3. Increase Gabapentin to 300 mg TID.  4. Continue taking Meloxicam 15 mg.  5. Continue wearing Brooks shoes. Do not go barefoot.  6. Return to clinic as needed.   Quit working for [employer]. Currently caring for mother.    Edrick Kins, DPM [facility]  Dr. Edrick Kins, DPM    [address], [place] [address]                Office [phone]  Fax [phone]

## 2018-08-11 ENCOUNTER — Other Ambulatory Visit: Payer: PRIVATE HEALTH INSURANCE

## 2018-08-11 ENCOUNTER — Other Ambulatory Visit: Payer: Self-pay

## 2018-08-11 ENCOUNTER — Ambulatory Visit (INDEPENDENT_AMBULATORY_CARE_PROVIDER_SITE_OTHER): Payer: PRIVATE HEALTH INSURANCE | Admitting: Primary Care

## 2018-08-11 DIAGNOSIS — N3941 Urge incontinence: Secondary | ICD-10-CM | POA: Diagnosis not present

## 2018-08-11 LAB — POC URINALSYSI DIPSTICK (AUTOMATED)
Bilirubin, UA: NEGATIVE
Blood, UA: NEGATIVE
Glucose, UA: NEGATIVE
Ketones, UA: NEGATIVE
Leukocytes, UA: NEGATIVE
Nitrite, UA: NEGATIVE
Protein, UA: NEGATIVE
Spec Grav, UA: 1.015 (ref 1.010–1.025)
Urobilinogen, UA: 0.2 E.U./dL
pH, UA: 5 (ref 5.0–8.0)

## 2018-08-11 MED ORDER — OXYBUTYNIN CHLORIDE ER 5 MG PO TB24: 5.0000 mg | ORAL_TABLET | Freq: Every day | ORAL | 0 refills | Status: DC

## 2018-08-11 NOTE — Addendum Note (Signed)
Addended by: Jacqualin Combes on: 08/11/2018 03:11 PM   Modules accepted: Orders

## 2018-08-11 NOTE — Progress Notes (Signed)
Subjective:    Patient ID: Tanya Nguyen, female    DOB: Nov 13, 1965, 53 y.o.   MRN: [id number]  HPI     Tanya Nguyen - 53 y.o. female  MRN [id number]  Date of Birth: 1965/12/08  PCP: Pleas Koch, NP  This service was provided via telemedicine. Phone Visit performed on 08/11/2018    Rationale for phone visit along with limitations reviewed. Patient consented to telephone encounter.    Location of patient: Home Location of provider: Office [place] @ [facility] Name of referring provider: N/A   Names of persons and role in encounter: Provider: Pleas Koch, NP  Patient: Tanya Nguyen  Other: N/A   Time on call: 11 min - 33 sec   Subjective: No chief complaint on file.    HPI:  Tanya Nguyen is a 53 year old female with a history of GERD, esophageal ulcer, anxiety, hysterectomy who presents today with a chief complaint of urinary incontinence.   Symptoms began about one year ago, worse over the last 2 months. She will experience an urge to urinate, will lose control of her bladder, and urinate in her underwear. This will occur anywhere from one to two times daily, has wet her pants in public. She is wearing pads and will saturate the pad with urine when she looses control. She denies dribbling of urine during the day.   She denies hematuria, dysuria, abdominal pain, fevers, nausea, vaginal symptoms.   Objective/Observations:   No physical exam or vital signs collected unless specifically identified below.   There were no vitals taken for this visit.   Respiratory status: speaks in complete sentences without evident shortness of breath.   Assessment/Plan:  Symptoms more suggestive of urinary incontinence than UTI. Will check UA to ensure. Discussed importance of Kegel Exercises. Discussed options for treatment including Kegal's, medication, Urology evaluation and she opts for Kegel's and medication. Rx for oxybuynin ER 5 mg sent to pharmacy. Discussed  potential side effects.  Discussed to limit water intake prior to bed, limit caffeine.  She will update in a few weeks.  No problem-specific Assessment & Plan notes found for this encounter.   I discussed the assessment and treatment plan with the patient. The patient was provided an opportunity to ask questions and all were answered. The patient agreed with the plan and demonstrated an understanding of the instructions.  Lab Orders  No laboratory test(s) ordered today    No orders of the defined types were placed in this encounter.   The patient was advised to call back or seek an in-person evaluation if the symptoms worsen or if the condition fails to improve as anticipated.  Pleas Koch, NP    Review of Systems  Constitutional: Negative for fever.  Respiratory: Negative for shortness of breath.   Cardiovascular: Negative for chest pain.  Gastrointestinal: Negative for abdominal pain.  Genitourinary: Positive for urgency. Negative for dysuria, flank pain, frequency, hematuria, pelvic pain and vaginal discharge.       Urinary incontinence        Past Medical History:  Diagnosis Date  . Bleeding duodenal ulcer   . Depression   . GERD (gastroesophageal reflux disease)   . Hypothyroidism   . Kidney stones   . Migraine headache    only1-2x/month since starting topamax     Social History   Socioeconomic History  . Marital status: Married    Spouse name: Not on file  . Number of children: Not on file  .  Years of education: Not on file  . Highest education level: Not on file  Occupational History  . Not on file  Social Needs  . Financial resource strain: Not on file  . Food insecurity:    Worry: Not on file    Inability: Not on file  . Transportation needs:    Medical: Not on file    Non-medical: Not on file  Tobacco Use  . Smoking status: Never Smoker  . Smokeless tobacco: Never Used  Substance and Sexual Activity  . Alcohol use: Not Currently     Alcohol/week: 0.0 standard drinks  . Drug use: No  . Sexual activity: Yes    Birth control/protection: None  Lifestyle  . Physical activity:    Days per week: Not on file    Minutes per session: Not on file  . Stress: Not on file  Relationships  . Social connections:    Talks on phone: Not on file    Gets together: Not on file    Attends religious service: Not on file    Active member of club or organization: Not on file    Attends meetings of clubs or organizations: Not on file    Relationship status: Not on file  . Intimate partner violence:    Fear of current or ex partner: Not on file    Emotionally abused: Not on file    Physically abused: Not on file    Forced sexual activity: Not on file  Other Topics Concern  . Not on file  Social History Narrative  . Not on file    Past Surgical History:  Procedure Laterality Date  . ABDOMINAL HYSTERECTOMY  2004   partial  . CHOLECYSTECTOMY    . COLONOSCOPY WITH PROPOFOL N/A 02/27/2018   Procedure: COLONOSCOPY WITH PROPOFOL;  Surgeon: [name], MD;  Location: [place];  Service: Endoscopy;  Laterality: N/A;  . ESOPHAGOGASTRODUODENOSCOPY (EGD) WITH PROPOFOL N/A 01/12/2018   Procedure: ESOPHAGOGASTRODUODENOSCOPY (EGD) WITH BIOPSIES;  Surgeon: [name], MD;  Location: [place];  Service: Endoscopy;  Laterality: N/A;  . ESOPHAGOGASTRODUODENOSCOPY (EGD) WITH PROPOFOL N/A 01/25/2018   Procedure: ESOPHAGOGASTRODUODENOSCOPY (EGD) WITH PROPOFOL;  Surgeon: Jonathon Bellows, MD;  Location[facility] END[facility]COPY;  Service: Gastroenterology;  Laterality: N/A;  . ESOPHAGOGASTRODUODENOSCOPY (EGD) WITH PROPOFOL N/A 02/27/2018   Procedure: ESOPHAGOGASTRODUODENOSCOPY (EGD) WITH PROPOFOL;  Surgeon: [name], MD;  Location: [place];  Service: Endoscopy;  Laterality: N/A;  . LAPAROTOMY N/A 01/20/2018   Procedure: EXPLORATORY LAPAROTOMY;  Surgeon: Olean Ree, MD;  Location: [facility];  Service: General;  Laterality:  N/A;  . TOOTH EXTRACTION      Family History  Problem Relation Age of Onset  . Cancer Mother   . Breast cancer Mother 31  . Cancer Father   . Alcohol abuse Sister   . Bipolar disorder Sister   . Alcohol abuse Maternal Grandmother   . Stroke Maternal Grandmother   . Stroke Paternal Grandmother     Allergies  Allergen Reactions  . Penicillins Shortness Of Breath, Diarrhea and Nausea And Vomiting    Has patient had a PCN reaction causing immediate rash, facial/tongue/throat swelling, SOB or lightheadedness with hypotension: yes Has patient had a PCN reaction causing severe rash involving mucus membranes or skin necrosis: no Has patient had a PCN reaction that required hospitalization no Has patient had a PCN reaction occurring within the last 10 years: about 10 years If all of the above answers are "NO", then may proceed with Cephalosporin  use.   . Sulfa Antibiotics Nausea And Vomiting    Other reaction(s): VOMITING    Current Outpatient Medications on File Prior to Visit  Medication Sig Dispense Refill  . cetirizine (ZYRTEC) 10 MG tablet TAKE 1 TABLET BY MOUTH EVERY DAY AS NEEDED FOR ALLERGY 30 tablet 5  . fluticasone (FLONASE) 50 MCG/ACT nasal spray Place 1 spray into both nostrils 2 (two) times daily. 16 g 0  . gabapentin (NEURONTIN) 300 MG capsule Take 1 capsule (300 mg total) by mouth 3 (three) times daily. 90 capsule 3  . levothyroxine (SYNTHROID, LEVOTHROID) 100 MCG tablet Take 1 tablet by mouth every morning on an empty stomach with water. No food or other medications for 30 minutes. 90 tablet 2  . meloxicam (MOBIC) 7.5 MG tablet Take 1 tablet (7.5 mg total) by mouth daily. 30 tablet 1  . methylPREDNISolone (MEDROL) 4 MG tablet Take as directed on package 21 tablet 0  . NONFORMULARY OR COMPOUNDED ITEM See pharmacy note 120 each 2  . pantoprazole (PROTONIX) 40 MG tablet Take 1 tablet (40 mg total) by mouth 2 (two) times daily. 60 tablet 11  . sertraline (ZOLOFT) 50 MG  tablet Take 1 tablet (50 mg total) by mouth daily. For anxiety. 90 tablet 1  . SUMAtriptan (IMITREX) 25 MG tablet TAKE 1 TABLET EVERY 2 HOURS AS NEEDED FOR MIGRAINE, MAY REPEAT IN 2 HOURS IF HEADACHE PERSIST/RECURS 9 tablet 0  . topiramate (TOPAMAX) 25 MG tablet Take 1 tablet by mouth at bedtime for migraine prevention. 90 tablet 1  . traZODone (DESYREL) 150 MG tablet Take 1 tablet (150 mg total) by mouth at bedtime. 90 tablet 1   No current facility-administered medications on file prior to visit.     There were no vitals taken for this visit.   Objective:   Physical Exam  Constitutional: She is oriented to person, place, and time.  Respiratory: Effort normal.  Neurological: She is alert and oriented to person, place, and time.  Psychiatric: She has a normal mood and affect.           Assessment & Plan:

## 2018-08-11 NOTE — Assessment & Plan Note (Signed)
Symptoms more suggestive of urinary incontinence than UTI. Will check UA to ensure. Discussed importance of Kegal Exercises. Discussed options for treatment including Kegal's, medication, Urology evaluation and she opts for Kegal's and medication. Rx for oxybuynin ER 5 mg sent to pharmacy. Discussed potential side effects.  Discussed to limit water intake prior to bed, limit caffeine.  She will update in a few weeks.

## 2018-08-11 NOTE — Patient Instructions (Signed)
Start oxybutynin ER 5 mg tablets for incontinence/bladder control. Take 1 tablet by mouth every night.   Limit water intake 1 hour prior to bed. Limit caffeine intake overall.  Do the Kegel Exercises as discussed.   Please update me in 3 weeks.  It was a pleasure to see you today! Allie Bossier, NP-C   Kegel Exercises Kegel exercises help strengthen the muscles that support the rectum, vagina, small intestine, bladder, and uterus. Doing Kegel exercises can help:  Improve bladder and bowel control.  Improve sexual response.  Reduce problems and discomfort during pregnancy. Kegel exercises involve squeezing your pelvic floor muscles, which are the same muscles you squeeze when you try to stop the flow of urine. The exercises can be done while sitting, standing, or lying down, but it is best to vary your position. Exercises 1. Squeeze your pelvic floor muscles tight. You should feel a tight lift in your rectal area. If you are a female, you should also feel a tightness in your vaginal area. Keep your stomach, buttocks, and legs relaxed. 2. Hold the muscles tight for up to 10 seconds. 3. Relax your muscles. Repeat this exercise 50 times a day or as many times as told by your health care provider. Continue to do this exercise for at least 4-6 weeks or for as long as told by your health care provider. This information is not intended to replace advice given to you by your health care provider. Make sure you discuss any questions you have with your health care provider. Document Released: 02/09/2012 Document Revised: 07/05/2016 Document Reviewed: 01/12/2015 Elsevier Interactive Patient Education  2019 [employer].

## 2018-08-14 ENCOUNTER — Other Ambulatory Visit: Payer: Self-pay

## 2018-08-14 ENCOUNTER — Ambulatory Visit (INDEPENDENT_AMBULATORY_CARE_PROVIDER_SITE_OTHER): Payer: PRIVATE HEALTH INSURANCE | Admitting: Primary Care

## 2018-08-14 VITALS — BP 126/80 | HR 78 | Temp 98.2°F | Ht 59.0 in | Wt 99.5 lb

## 2018-08-14 DIAGNOSIS — N3941 Urge incontinence: Secondary | ICD-10-CM | POA: Diagnosis not present

## 2018-08-14 DIAGNOSIS — M533 Sacrococcygeal disorders, not elsewhere classified: Secondary | ICD-10-CM

## 2018-08-14 DIAGNOSIS — N3000 Acute cystitis without hematuria: Secondary | ICD-10-CM | POA: Diagnosis not present

## 2018-08-14 HISTORY — DX: Sacrococcygeal disorders, not elsewhere classified: M53.3

## 2018-08-14 LAB — URINE CULTURE
MICRO NUMBER:: 541707
SPECIMEN QUALITY:: ADEQUATE

## 2018-08-14 MED ORDER — CYCLOBENZAPRINE HCL 5 MG PO TABS: 5.0000 mg | ORAL_TABLET | Freq: Three times a day (TID) | ORAL | 0 refills | Status: DC | PRN

## 2018-08-14 MED ORDER — CEPHALEXIN 500 MG PO CAPS: 500.0000 mg | ORAL_CAPSULE | Freq: Two times a day (BID) | ORAL | 0 refills | Status: AC

## 2018-08-14 MED ORDER — KETOROLAC TROMETHAMINE 60 MG/2ML IM SOLN
60.0000 mg | Freq: Once | INTRAMUSCULAR | Status: AC
  Administered 2018-08-14: 60 mg via INTRAMUSCULAR

## 2018-08-14 NOTE — Patient Instructions (Signed)
Your urine does show an infection which will require antibiotics.  Start Cephalexin antibiotics for the infection. Take 1 capsule by mouth twice daily for 7 days.  You may take the cyclobenzaprine every 8 hours as needed for muscle spasms. This may cause drowsiness.   Make sure to stretch your buttocks and hip to prevent further stiffness.   You can apply a heating pad if needed.  It was a pleasure to see you today!

## 2018-08-14 NOTE — Assessment & Plan Note (Signed)
Improving with oxybutynin.  Continue same.

## 2018-08-14 NOTE — Progress Notes (Signed)
Subjective:    Patient ID: Tanya Nguyen, female    DOB: 05-08-1965, 54 y.o.   MRN: [id number]  HPI  [name] is a 53 year old female who presents today with a chief complaint of buttocks pain  She was last evaluated virtually on 08/11/18 with reports of urge incontinence. She denied urinary symptoms. UA was collected to rule out any other cause, this was negative. Urine culture recently came back positive for Proteus mirabilis. She did pick up the oxybutynin and has noticed improvement in urinary incontinence.   Today she endorses right buttocks pain. She was cleaning out cabinets in her kitchen and pulling out dishes one week ago. She woke up the following day with a mild pressure pain with decrease in ROM to the right hip and buttocks. Her pain was present mildly all last week but this morning she woke up in moderate pain. She describes her symptoms as a sharp, stiff, throbbing pain. She can feel her pain radiate down her right buttocks down to her right lateral thigh.   She's taken Tylenol without improvement. She did take one her mothers muscle relaxer medications with mild improvement. She denies injury/trauma.   Review of Systems  Musculoskeletal: Positive for arthralgias.  Skin: Negative for color change.  Neurological: Negative for weakness and numbness.       Past Medical History:  Diagnosis Date  . Bleeding duodenal ulcer   . Depression   . GERD (gastroesophageal reflux disease)   . Hypothyroidism   . Kidney stones   . Migraine headache    only1-2x/month since starting topamax     Social History   Socioeconomic History  . Marital status: Married    Spouse name: Not on file  . Number of children: Not on file  . Years of education: Not on file  . Highest education level: Not on file  Occupational History  . Not on file  Social Needs  . Financial resource strain: Not on file  . Food insecurity:    Worry: Not on file    Inability: Not on file  . Transportation  needs:    Medical: Not on file    Non-medical: Not on file  Tobacco Use  . Smoking status: Never Smoker  . Smokeless tobacco: Never Used  Substance and Sexual Activity  . Alcohol use: Not Currently    Alcohol/week: 0.0 standard drinks  . Drug use: No  . Sexual activity: Yes    Birth control/protection: None  Lifestyle  . Physical activity:    Days per week: Not on file    Minutes per session: Not on file  . Stress: Not on file  Relationships  . Social connections:    Talks on phone: Not on file    Gets together: Not on file    Attends religious service: Not on file    Active member of club or organization: Not on file    Attends meetings of clubs or organizations: Not on file    Relationship status: Not on file  . Intimate partner violence:    Fear of current or ex partner: Not on file    Emotionally abused: Not on file    Physically abused: Not on file    Forced sexual activity: Not on file  Other Topics Concern  . Not on file  Social History Narrative  . Not on file    Past Surgical History:  Procedure Laterality Date  . ABDOMINAL HYSTERECTOMY  2004   partial  .  CHOLECYSTECTOMY    . COLONOSCOPY WITH PROPOFOL N/A 02/27/2018   Procedure: COLONOSCOPY WITH PROPOFOL;  Surgeon: [name], MD;  Location: [place];  Service: Endoscopy;  Laterality: N/A;  . ESOPHAGOGASTRODUODENOSCOPY (EGD) WITH PROPOFOL N/A 01/12/2018   Procedure: ESOPHAGOGASTRODUODENOSCOPY (EGD) WITH BIOPSIES;  Surgeon: [name], MD;  Location: [place];  Service: Endoscopy;  Laterality: N/A;  . ESOPHAGOGASTRODUODENOSCOPY (EGD) WITH PROPOFOL N/A 01/25/2018   Procedure: ESOPHAGOGASTRODUODENOSCOPY (EGD) WITH PROPOFOL;  Surgeon: Jonathon Bellows, MD;  Location: [facility] ENDOSCOPY;  Service: Gastroenterology;  Laterality: N/A;  . ESOPHAGOGASTRODUODENOSCOPY (EGD) WITH PROPOFOL N/A 02/27/2018   Procedure: ESOPHAGOGASTRODUODENOSCOPY (EGD) WITH PROPOFOL;  Surgeon: [name], MD;  Location:  [place];  Service: Endoscopy;  Laterality: N/A;  . LAPAROTOMY N/A 01/20/2018   Procedure: EXPLORATORY LAPAROTOMY;  Surgeon: Olean Ree, MD;  Location: [facility];  Service: General;  Laterality: N/A;  . TOOTH EXTRACTION      Family History  Problem Relation Age of Onset  . Cancer Mother   . Breast cancer Mother 21  . Cancer Father   . Alcohol abuse Sister   . Bipolar disorder Sister   . Alcohol abuse Maternal Grandmother   . Stroke Maternal Grandmother   . Stroke Paternal Grandmother     Allergies  Allergen Reactions  . Penicillins Shortness Of Breath, Diarrhea and Nausea And Vomiting    Has patient had a PCN reaction causing immediate rash, facial/tongue/throat swelling, SOB or lightheadedness with hypotension: yes Has patient had a PCN reaction causing severe rash involving mucus membranes or skin necrosis: no Has patient had a PCN reaction that required hospitalization no Has patient had a PCN reaction occurring within the last 10 years: about 10 years If all of the above answers are "NO", then may proceed with Cephalosporin use.   . Sulfa Antibiotics Nausea And Vomiting    Other reaction(s): VOMITING    Current Outpatient Medications on File Prior to Visit  Medication Sig Dispense Refill  . cetirizine (ZYRTEC) 10 MG tablet TAKE 1 TABLET BY MOUTH EVERY DAY AS NEEDED FOR ALLERGY 30 tablet 5  . fluticasone (FLONASE) 50 MCG/ACT nasal spray Place 1 spray into both nostrils 2 (two) times daily. 16 g 0  . gabapentin (NEURONTIN) 300 MG capsule Take 1 capsule (300 mg total) by mouth 3 (three) times daily. 90 capsule 3  . levothyroxine (SYNTHROID, LEVOTHROID) 100 MCG tablet Take 1 tablet by mouth every morning on an empty stomach with water. No food or other medications for 30 minutes. 90 tablet 2  . meloxicam (MOBIC) 7.5 MG tablet Take 1 tablet (7.5 mg total) by mouth daily. 30 tablet 1  . NONFORMULARY OR COMPOUNDED ITEM See pharmacy note 120 each 2  . oxybutynin  (DITROPAN-XL) 5 MG 24 hr tablet Take 1 tablet (5 mg total) by mouth at bedtime. For overactive bladder. 30 tablet 0  . pantoprazole (PROTONIX) 40 MG tablet Take 1 tablet (40 mg total) by mouth 2 (two) times daily. 60 tablet 11  . sertraline (ZOLOFT) 50 MG tablet Take 1 tablet (50 mg total) by mouth daily. For anxiety. 90 tablet 1  . SUMAtriptan (IMITREX) 25 MG tablet TAKE 1 TABLET EVERY 2 HOURS AS NEEDED FOR MIGRAINE, MAY REPEAT IN 2 HOURS IF HEADACHE PERSIST/RECURS 9 tablet 0  . topiramate (TOPAMAX) 25 MG tablet Take 1 tablet by mouth at bedtime for migraine prevention. 90 tablet 1  . traZODone (DESYREL) 150 MG tablet Take 1 tablet (150 mg total) by mouth at bedtime. 90 tablet  1   No current facility-administered medications on file prior to visit.     BP 126/80   Pulse 78   Temp 98.2 F (36.8 C) (Tympanic)   Ht 4\' 11"  (1.499 m)   Wt 99 lb 8 oz (45.1 kg)   SpO2 97%   BMI 20.10 kg/m    Objective:   Physical Exam  Constitutional: She appears well-nourished.  Cardiovascular: Normal rate.  Respiratory: Effort normal.  Musculoskeletal:       Back:     Right upper leg: She exhibits no tenderness.       Legs:     Comments: Decrease in ROM to right hip and buttocks. Pain to area of piriformis muscle/sacroiliac joint.  Skin: Skin is warm and dry.           Assessment & Plan:  Acute Cystitis:  Noted on recent urine culture. High resistance to numerous antibiotics. She is PCN allergic but endorses that she can take Keflex. She has been on Cefazolin in the past.  Rx for cephalexin course sent to pharmacy. She will update.   Pleas Koch, NP

## 2018-08-14 NOTE — Assessment & Plan Note (Signed)
Suspect from overuse with recent activity. Exam today overall stable. IM Toradol 60 mg provided today. Rx for cyclobenzaprine provided to use PRN. Drowsiness precautions provided. Discussed stretching,  ice/heat, Tylenol PRN.

## 2018-08-15 ENCOUNTER — Telehealth: Payer: Self-pay | Admitting: Primary Care

## 2018-08-15 DIAGNOSIS — M533 Sacrococcygeal disorders, not elsewhere classified: Secondary | ICD-10-CM

## 2018-08-15 MED ORDER — PREDNISONE 20 MG PO TABS: ORAL_TABLET | ORAL | 0 refills | Status: DC

## 2018-08-15 NOTE — Telephone Encounter (Signed)
Patient called back. Stated she is really hurting and wanted to check status of message that was sent  [facility]- [phone]

## 2018-08-15 NOTE — Telephone Encounter (Signed)
Patient stated she was in the office yesterday for hip pain. She was prescribed a muscle relaxer and stated that she is wanting something prescribed for the pain.. Patient stated that her pain in a 10. And its is excruciating pain.  Patient stated that yesterday the shot seemed to help but today the hip pain has been worse.  Patient phone- [phone]    [facility] [place]

## 2018-08-15 NOTE — Telephone Encounter (Signed)
Spoke with patient via phone, she is having radicular pain with tingling to right lower extremity. No improvement with Tylenol and Meloxicam. Continue cyclobenzaprine, add prednisone taper. Stop Meloxicam. She verbalized understanding.

## 2018-08-18 ENCOUNTER — Other Ambulatory Visit: Payer: Self-pay | Admitting: Podiatry

## 2018-08-21 ENCOUNTER — Telehealth: Payer: Self-pay

## 2018-08-21 NOTE — Telephone Encounter (Signed)
Per Dr. Amalia Hailey verbal order, ok to refill Tramadol.   New refill has been called into pharmacy

## 2018-08-21 NOTE — Telephone Encounter (Signed)
-----   Message from Simone Curia sent at 08/21/2018  9:37 AM EDT ----- Regarding: med refill Pt would like a refill on Tramadol and pharmacy states they already sent it over last week. [facility] [address] Thank you.

## 2018-08-25 ENCOUNTER — Encounter: Payer: Self-pay | Admitting: Primary Care

## 2018-08-25 ENCOUNTER — Ambulatory Visit (INDEPENDENT_AMBULATORY_CARE_PROVIDER_SITE_OTHER): Payer: PRIVATE HEALTH INSURANCE | Admitting: Primary Care

## 2018-08-25 ENCOUNTER — Ambulatory Visit (INDEPENDENT_AMBULATORY_CARE_PROVIDER_SITE_OTHER)
Admission: RE | Admit: 2018-08-25 | Discharge: 2018-08-25 | Disposition: A | Discharge status: Home / Self Care | Payer: PRIVATE HEALTH INSURANCE | Source: Ambulatory Visit | Attending: Primary Care | Admitting: Primary Care

## 2018-08-25 ENCOUNTER — Telehealth: Payer: Self-pay | Admitting: Primary Care

## 2018-08-25 ENCOUNTER — Other Ambulatory Visit: Payer: Self-pay | Admitting: Primary Care

## 2018-08-25 ENCOUNTER — Other Ambulatory Visit: Payer: Self-pay

## 2018-08-25 ENCOUNTER — Ambulatory Visit: Payer: PRIVATE HEALTH INSURANCE | Admitting: Podiatry

## 2018-08-25 VITALS — BP 114/80 | HR 96 | Temp 98.2°F | Ht 59.0 in | Wt 99.0 lb

## 2018-08-25 DIAGNOSIS — R140 Abdominal distension (gaseous): Secondary | ICD-10-CM | POA: Diagnosis not present

## 2018-08-25 DIAGNOSIS — K5900 Constipation, unspecified: Secondary | ICD-10-CM

## 2018-08-25 DIAGNOSIS — G43701 Chronic migraine without aura, not intractable, with status migrainosus: Secondary | ICD-10-CM

## 2018-08-25 MED ORDER — TOPIRAMATE 25 MG PO TABS: ORAL_TABLET | ORAL | 1 refills | Status: DC

## 2018-08-25 MED ORDER — LINACLOTIDE 145 MCG PO CAPS: ORAL_CAPSULE | ORAL | 0 refills | Status: DC

## 2018-08-25 MED ORDER — KETOROLAC TROMETHAMINE 60 MG/2ML IM SOLN
60.0000 mg | Freq: Once | INTRAMUSCULAR | Status: AC
  Administered 2018-08-25: 60 mg via INTRAMUSCULAR

## 2018-08-25 NOTE — Addendum Note (Signed)
Addended by: Jacqualin Combes on: 08/25/2018 02:01 PM   Modules accepted: Orders

## 2018-08-25 NOTE — Patient Instructions (Signed)
Complete xray(s) prior to leaving today. I will notify you of your results once received.  I sent a refill of your topiramate 25 mg mediation to your pharmacy.  Stop taking gabapentin.  It was a pleasure to see you today!

## 2018-08-25 NOTE — Assessment & Plan Note (Addendum)
Endorses no bowel movement in one month. Exam today with moderately distended abdomen. Check abdominal plain films today. Consider magnesium citrate vs refill of Linzess. Await results to rule out obstruction.

## 2018-08-25 NOTE — Assessment & Plan Note (Signed)
Endorses no bowel movement in one month. Exam today with moderately distended abdomen. Doesn't appear to be ascites.  Check abdominal plain films today. Consider magnesium citrate vs refill of Linzess. Await results to rule out obstruction.

## 2018-08-25 NOTE — Assessment & Plan Note (Signed)
Improved with nightly Topamax 25 mg, continue same. Will treat current migraine with IM Toradol 60 mg.   Continue Topamax nightly and PRN Imitrex.  Neuro exam unremarkable.

## 2018-08-25 NOTE — Progress Notes (Signed)
Subjective:    Patient ID: Tanya Nguyen, female    DOB: Jul 24, 1965, 53 y.o.   MRN: [id number]  HPI  Ms. Dickie is a 53 year old female with a history of migraines, GERD, duodenal ulcer, duodenal obstruction, gastric outlet obstruction who presents today with a chief complaint of migraine and abdominal bloating/distension.   1) Migraine: Her headache began three days ago and is located to the right partial lobe. She's taking topiramate 25 mg daily at bedtime and has noticed an overall reduction in headache and migraine frequency. She's recently taken sumatriptan twice within the last two days with improvement but without without resolve.   She endorses photophobia and nausea.   2) Abdominal Bloating: She also endorses abdominal bloating with distention that has been present for the last one month, worse each time she eats. She's not had a bowel movement in one month despite a healthy, high fiber diet.   She does have intermittent nausea. She's taken stool softeners, Miralax, Metamucil without improvement. She believes these symptoms began around the time that she's been on gabapentin. She did take her morning dose of gabapentin today. Denies vomiting, fevers, fatigue.  Review of Systems  Constitutional: Negative for fever.  Eyes: Positive for photophobia.  Respiratory: Negative for shortness of breath.   Cardiovascular: Negative for chest pain.  Gastrointestinal: Positive for abdominal distention and constipation. Negative for vomiting.  Neurological: Positive for headaches.       Past Medical History:  Diagnosis Date  . Bleeding duodenal ulcer   . Depression   . GERD (gastroesophageal reflux disease)   . Hypothyroidism   . Kidney stones   . Migraine headache    only1-2x/month since starting topamax     Social History   Socioeconomic History  . Marital status: Married    Spouse name: Not on file  . Number of children: Not on file  . Years of education: Not on file  .  Highest education level: Not on file  Occupational History  . Not on file  Social Needs  . Financial resource strain: Not on file  . Food insecurity    Worry: Not on file    Inability: Not on file  . Transportation needs    Medical: Not on file    Non-medical: Not on file  Tobacco Use  . Smoking status: Never Smoker  . Smokeless tobacco: Never Used  Substance and Sexual Activity  . Alcohol use: Not Currently    Alcohol/week: 0.0 standard drinks  . Drug use: No  . Sexual activity: Yes    Birth control/protection: None  Lifestyle  . Physical activity    Days per week: Not on file    Minutes per session: Not on file  . Stress: Not on file  Relationships  . Social Herbalist on phone: Not on file    Gets together: Not on file    Attends religious service: Not on file    Active member of club or organization: Not on file    Attends meetings of clubs or organizations: Not on file    Relationship status: Not on file  . Intimate partner violence    Fear of current or ex partner: Not on file    Emotionally abused: Not on file    Physically abused: Not on file    Forced sexual activity: Not on file  Other Topics Concern  . Not on file  Social History Narrative  . Not on file  Past Surgical History:  Procedure Laterality Date  . ABDOMINAL HYSTERECTOMY  2004   partial  . CHOLECYSTECTOMY    . COLONOSCOPY WITH PROPOFOL N/A 02/27/2018   Procedure: COLONOSCOPY WITH PROPOFOL;  Surgeon: [name], MD;  Location: [place];  Service: Endoscopy;  Laterality: N/A;  . ESOPHAGOGASTRODUODENOSCOPY (EGD) WITH PROPOFOL N/A 01/12/2018   Procedure: ESOPHAGOGASTRODUODENOSCOPY (EGD) WITH BIOPSIES;  Surgeon: [name], MD;  Location: [place];  Service: Endoscopy;  Laterality: N/A;  . ESOPHAGOGASTRODUODENOSCOPY (EGD) WITH PROPOFOL N/A 01/25/2018   Procedure: ESOPHAGOGASTRODUODENOSCOPY (EGD) WITH PROPOFOL;  Surgeon: Jonathon Bellows, MD;  Location: [facility]  ENDOSCOPY;  Service: Gastroenterology;  Laterality: N/A;  . ESOPHAGOGASTRODUODENOSCOPY (EGD) WITH PROPOFOL N/A 02/27/2018   Procedure: ESOPHAGOGASTRODUODENOSCOPY (EGD) WITH PROPOFOL;  Surgeon: [name], MD;  Location: [place];  Service: Endoscopy;  Laterality: N/A;  . LAPAROTOMY N/A 01/20/2018   Procedure: EXPLORATORY LAPAROTOMY;  Surgeon: Olean Ree, MD;  Location: [facility];  Service: General;  Laterality: N/A;  . TOOTH EXTRACTION      Family History  Problem Relation Age of Onset  . Cancer Mother   . Breast cancer Mother 66  . Cancer Father   . Alcohol abuse Sister   . Bipolar disorder Sister   . Alcohol abuse Maternal Grandmother   . Stroke Maternal Grandmother   . Stroke Paternal Grandmother     Allergies  Allergen Reactions  . Penicillins Shortness Of Breath, Diarrhea and Nausea And Vomiting    Has patient had a PCN reaction causing immediate rash, facial/tongue/throat swelling, SOB or lightheadedness with hypotension: yes Has patient had a PCN reaction causing severe rash involving mucus membranes or skin necrosis: no Has patient had a PCN reaction that required hospitalization no Has patient had a PCN reaction occurring within the last 10 years: about 10 years If all of the above answers are "NO", then may proceed with Cephalosporin use.   . Sulfa Antibiotics Nausea And Vomiting    Other reaction(s): VOMITING    Current Outpatient Medications on File Prior to Visit  Medication Sig Dispense Refill  . cetirizine (ZYRTEC) 10 MG tablet TAKE 1 TABLET BY MOUTH EVERY DAY AS NEEDED FOR ALLERGY 30 tablet 5  . cyclobenzaprine (FLEXERIL) 5 MG tablet Take 1 tablet (5 mg total) by mouth 3 (three) times daily as needed for muscle spasms. 15 tablet 0  . fluticasone (FLONASE) 50 MCG/ACT nasal spray Place 1 spray into both nostrils 2 (two) times daily. 16 g 0  . gabapentin (NEURONTIN) 300 MG capsule Take 1 capsule (300 mg total) by mouth 3 (three) times daily. 90  capsule 3  . levothyroxine (SYNTHROID, LEVOTHROID) 100 MCG tablet Take 1 tablet by mouth every morning on an empty stomach with water. No food or other medications for 30 minutes. 90 tablet 2  . meloxicam (MOBIC) 7.5 MG tablet Take 1 tablet (7.5 mg total) by mouth daily. 30 tablet 1  . NONFORMULARY OR COMPOUNDED ITEM See pharmacy note 120 each 2  . oxybutynin (DITROPAN-XL) 5 MG 24 hr tablet Take 1 tablet (5 mg total) by mouth at bedtime. For overactive bladder. 30 tablet 0  . pantoprazole (PROTONIX) 40 MG tablet Take 1 tablet (40 mg total) by mouth 2 (two) times daily. 60 tablet 11  . sertraline (ZOLOFT) 50 MG tablet Take 1 tablet (50 mg total) by mouth daily. For anxiety. 90 tablet 1  . SUMAtriptan (IMITREX) 25 MG tablet TAKE 1 TABLET EVERY 2 HOURS AS NEEDED FOR MIGRAINE, MAY REPEAT IN 2 HOURS  IF HEADACHE PERSIST/RECURS 9 tablet 0  . traZODone (DESYREL) 150 MG tablet Take 1 tablet (150 mg total) by mouth at bedtime. 90 tablet 1   No current facility-administered medications on file prior to visit.     BP 114/80   Pulse 96   Temp 98.2 F (36.8 C) (Oral)   Ht 4\' 11"  (1.499 m)   Wt 99 lb (44.9 kg)   SpO2 98%   BMI 20.00 kg/m    Objective:   Physical Exam  Constitutional: She appears well-nourished.  Eyes: EOM are normal.  Neck: Neck supple.  Cardiovascular: Normal rate and regular rhythm.  Respiratory: Effort normal and breath sounds normal.  GI: She exhibits distension. She exhibits no ascites. Bowel sounds are decreased. There is abdominal tenderness in the left lower quadrant. There is no rebound.  Semi-soft, moderately distended abdomen  Neurological: No cranial nerve deficit.  Skin: Skin is warm and dry.  Psychiatric: She has a normal mood and affect.           Assessment & Plan:

## 2018-08-25 NOTE — Telephone Encounter (Signed)
Yes, okay to add a slot at 12:20 today.

## 2018-08-25 NOTE — Telephone Encounter (Signed)
Patient called today stating that she is having a horrible magarine she asked if we could have her scheduled for a shot.  She also stated that she is taking hair skin and nail vitamins and the last few weeks her hair has been falling out in clots. She stated it usually happens when she is washing her hair. Patient stated that she went to [employer] and bought a special shampoo that is suppose to help with her hair falling out and it did not help at all     Patient is also needing her migraine medication sent she has been without this for several days  SUMATRIPTAN    Patient C/B # [phone]

## 2018-08-28 ENCOUNTER — Telehealth: Payer: Self-pay | Admitting: Primary Care

## 2018-08-28 NOTE — Telephone Encounter (Signed)
Patient said [facility] told her they need a prior authorization for Linzess.

## 2018-08-29 ENCOUNTER — Ambulatory Visit: Payer: Self-pay | Admitting: Primary Care

## 2018-08-29 ENCOUNTER — Other Ambulatory Visit: Payer: Self-pay | Admitting: Primary Care

## 2018-08-29 ENCOUNTER — Other Ambulatory Visit: Payer: Self-pay

## 2018-08-29 ENCOUNTER — Ambulatory Visit (INDEPENDENT_AMBULATORY_CARE_PROVIDER_SITE_OTHER): Payer: PRIVATE HEALTH INSURANCE | Admitting: Primary Care

## 2018-08-29 ENCOUNTER — Encounter: Payer: Self-pay | Admitting: Primary Care

## 2018-08-29 VITALS — BP 118/80 | HR 71 | Temp 97.7°F | Ht 59.0 in | Wt 95.5 lb

## 2018-08-29 DIAGNOSIS — E559 Vitamin D deficiency, unspecified: Secondary | ICD-10-CM | POA: Diagnosis not present

## 2018-08-29 DIAGNOSIS — E038 Other specified hypothyroidism: Secondary | ICD-10-CM

## 2018-08-29 DIAGNOSIS — N3941 Urge incontinence: Secondary | ICD-10-CM

## 2018-08-29 DIAGNOSIS — R739 Hyperglycemia, unspecified: Secondary | ICD-10-CM | POA: Diagnosis not present

## 2018-08-29 DIAGNOSIS — G43001 Migraine without aura, not intractable, with status migrainosus: Secondary | ICD-10-CM | POA: Diagnosis not present

## 2018-08-29 DIAGNOSIS — E538 Deficiency of other specified B group vitamins: Secondary | ICD-10-CM

## 2018-08-29 DIAGNOSIS — R7989 Other specified abnormal findings of blood chemistry: Secondary | ICD-10-CM

## 2018-08-29 DIAGNOSIS — F3130 Bipolar disorder, current episode depressed, mild or moderate severity, unspecified: Secondary | ICD-10-CM

## 2018-08-29 DIAGNOSIS — Z0000 Encounter for general adult medical examination without abnormal findings: Secondary | ICD-10-CM | POA: Diagnosis not present

## 2018-08-29 DIAGNOSIS — K219 Gastro-esophageal reflux disease without esophagitis: Secondary | ICD-10-CM

## 2018-08-29 DIAGNOSIS — K5900 Constipation, unspecified: Secondary | ICD-10-CM

## 2018-08-29 DIAGNOSIS — M722 Plantar fascial fibromatosis: Secondary | ICD-10-CM

## 2018-08-29 DIAGNOSIS — R140 Abdominal distension (gaseous): Secondary | ICD-10-CM

## 2018-08-29 LAB — LIPID PANEL
Cholesterol: 189 mg/dL (ref 0–200)
HDL: 59.4 mg/dL (ref >39.00)
LDL Cholesterol: 119 mg/dL — ABNORMAL HIGH (ref 0–99)
NonHDL: 129.19
Total CHOL/HDL Ratio: 3
Triglycerides: 51 mg/dL (ref 0.0–149.0)
VLDL: 10.2 mg/dL (ref 0.0–40.0)

## 2018-08-29 LAB — COMPREHENSIVE METABOLIC PANEL
ALT: 38 U/L — ABNORMAL HIGH (ref 0–35)
AST: 40 U/L — ABNORMAL HIGH (ref 0–37)
Albumin: 3.7 g/dL (ref 3.5–5.2)
Alkaline Phosphatase: 161 U/L — ABNORMAL HIGH (ref 39–117)
BUN: 19 mg/dL (ref 6–23)
CO2: 27 mEq/L (ref 19–32)
Calcium: 8.4 mg/dL (ref 8.4–10.5)
Chloride: 106 mEq/L (ref 96–112)
Creatinine, Ser: 0.61 mg/dL (ref 0.40–1.20)
GFR: 102.24 mL/min (ref >60.00)
Glucose, Bld: 98 mg/dL (ref 70–99)
Potassium: 3.8 mEq/L (ref 3.5–5.1)
Sodium: 139 mEq/L (ref 135–145)
Total Bilirubin: 0.2 mg/dL (ref 0.2–1.2)
Total Protein: 6 g/dL (ref 6.0–8.3)

## 2018-08-29 LAB — VITAMIN B12: Vitamin B-12: 241 pg/mL (ref 211–911)

## 2018-08-29 LAB — VITAMIN D 25 HYDROXY (VIT D DEFICIENCY, FRACTURES): VITD: 33.26 ng/mL (ref 30.00–100.00)

## 2018-08-29 LAB — TSH: TSH: 0.73 u[IU]/mL (ref 0.35–4.50)

## 2018-08-29 LAB — HEMOGLOBIN A1C: Hgb A1c MFr Bld: 5.7 % (ref 4.6–6.5)

## 2018-08-29 NOTE — Assessment & Plan Note (Signed)
Repeat B12 pending 

## 2018-08-29 NOTE — Assessment & Plan Note (Signed)
Distension secondary to constipation which has significantly improved. She took one of her mother's Linzess capsules as her's is under prior authorization. Discussed to take every 1-3 days or as needed. She will update.

## 2018-08-29 NOTE — Assessment & Plan Note (Signed)
No longer taking gabapentin due to moderate hair loss. Following with podiatry.

## 2018-08-29 NOTE — Assessment & Plan Note (Signed)
Immunizations UTD. Mammogram due this Fall 2020. Colonoscopy UTD. Commended her on a healthy diet, encouraged continued exercise. Exam unremarkable. Labs pending. Follow up in 1 year for CPE.

## 2018-08-29 NOTE — Assessment & Plan Note (Signed)
Repeat vitamin D pending. 

## 2018-08-29 NOTE — Patient Instructions (Signed)
Stop by the lab prior to leaving today. I will notify you of your results once received.   You can take the Linzess every day or every other day as needed for constipation.   Start exercising. You should be getting 150 minutes of moderate intensity exercise weekly.  Continue to eat a healthy diet.  It was a pleasure to see you today!   Preventive Care 40-64 Years, Female Preventive care refers to lifestyle choices and visits with your health care provider that can promote health and wellness. What does preventive care include?   A yearly physical exam. This is also called an annual well check.  Dental exams once or twice a year.  Routine eye exams. Ask your health care provider how often you should have your eyes checked.  Personal lifestyle choices, including: ? Daily care of your teeth and gums. ? Regular physical activity. ? Eating a healthy diet. ? Avoiding tobacco and drug use. ? Limiting alcohol use. ? Practicing safe sex. ? Taking low-dose aspirin daily starting at age 67. ? Taking vitamin and mineral supplements as recommended by your health care provider. What happens during an annual well check? The services and screenings done by your health care provider during your annual well check will depend on your age, overall health, lifestyle risk factors, and family history of disease. Counseling Your health care provider may ask you questions about your:  Alcohol use.  Tobacco use.  Drug use.  Emotional well-being.  Home and relationship well-being.  Sexual activity.  Eating habits.  Work and work Statistician.  Method of birth control.  Menstrual cycle.  Pregnancy history. Screening You may have the following tests or measurements:  Height, weight, and BMI.  Blood pressure.  Lipid and cholesterol levels. These may be checked every 5 years, or more frequently if you are over 1 years old.  Skin check.  Lung cancer screening. You may have this  screening every year starting at age 53 if you have a 30-pack-year history of smoking and currently smoke or have quit within the past 15 years.  Colorectal cancer screening. All adults should have this screening starting at age 61 and continuing until age 60. Your health care provider may recommend screening at age 39. You will have tests every 1-10 years, depending on your results and the type of screening test. People at increased risk should start screening at an earlier age. Screening tests may include: ? Guaiac-based fecal occult blood testing. ? Fecal immunochemical test (FIT). ? Stool DNA test. ? Virtual colonoscopy. ? Sigmoidoscopy. During this test, a flexible tube with a tiny camera (sigmoidoscope) is used to examine your rectum and lower colon. The sigmoidoscope is inserted through your anus into your rectum and lower colon. ? Colonoscopy. During this test, a long, thin, flexible tube with a tiny camera (colonoscope) is used to examine your entire colon and rectum.  Hepatitis C blood test.  Hepatitis B blood test.  Sexually transmitted disease (STD) testing.  Diabetes screening. This is done by checking your blood sugar (glucose) after you have not eaten for a while (fasting). You may have this done every 1-3 years.  Mammogram. This may be done every 1-2 years. Talk to your health care provider about when you should start having regular mammograms. This may depend on whether you have a family history of breast cancer.  BRCA-related cancer screening. This may be done if you have a family history of breast, ovarian, tubal, or peritoneal cancers.  Pelvic exam  and Pap test. This may be done every 3 years starting at age 29. Starting at age 27, this may be done every 5 years if you have a Pap test in combination with an HPV test.  Bone density scan. This is done to screen for osteoporosis. You may have this scan if you are at high risk for osteoporosis. Discuss your test results,  treatment options, and if necessary, the need for more tests with your health care provider. Vaccines Your health care provider may recommend certain vaccines, such as:  Influenza vaccine. This is recommended every year.  Tetanus, diphtheria, and acellular pertussis (Tdap, Td) vaccine. You may need a Td booster every 10 years.  Varicella vaccine. You may need this if you have not been vaccinated.  Zoster vaccine. You may need this after age 71.  Measles, mumps, and rubella (MMR) vaccine. You may need at least one dose of MMR if you were born in 1957 or later. You may also need a second dose.  Pneumococcal 13-valent conjugate (PCV13) vaccine. You may need this if you have certain conditions and were not previously vaccinated.  Pneumococcal polysaccharide (PPSV23) vaccine. You may need one or two doses if you smoke cigarettes or if you have certain conditions.  Meningococcal vaccine. You may need this if you have certain conditions.  Hepatitis A vaccine. You may need this if you have certain conditions or if you travel or work in places where you may be exposed to hepatitis A.  Hepatitis B vaccine. You may need this if you have certain conditions or if you travel or work in places where you may be exposed to hepatitis B.  Haemophilus influenzae type b (Hib) vaccine. You may need this if you have certain conditions. Talk to your health care provider about which screenings and vaccines you need and how often you need them. This information is not intended to replace advice given to you by your health care provider. Make sure you discuss any questions you have with your health care provider. Document Released: 03/21/2015 Document Revised: 04/14/2017 Document Reviewed: 12/24/2014 Elsevier Interactive Patient Education  2019 [employer].

## 2018-08-29 NOTE — Assessment & Plan Note (Signed)
Complaint to levothyroxine 100 mcg, taking appropriately. Repeat TSH pending.

## 2018-08-29 NOTE — Assessment & Plan Note (Signed)
Much improved with oxybutynin. Continue same.

## 2018-08-29 NOTE — Assessment & Plan Note (Signed)
Migraines/headaches improved with initiation of Topamax. Continue same.

## 2018-08-29 NOTE — Progress Notes (Signed)
Subjective:    Patient ID: Tanya Nguyen, female    DOB: 03-28-1965, 53 y.o.   MRN: [id number]  HPI  Tanya Nguyen is a 53 year old female who presents today for complete physical. She has no complaints today.  Immunizations: -Tetanus: Completed in 2016 -Influenza: Due this season   Diet: She endorses a healthy diet. Her diet consists of vegetables, fruit, lean protein, starch. She is drinking mostly water during the day, sweet tea with dinner.  Exercise: She is walking in her neighborhood   Eye exam: Completed in 2019 Dental exam: Completes semi-annually  Colonoscopy: Completed in 2019 Pap Smear: Hysterectomy  Mammogram: Completed in 2019   Review of Systems  Constitutional: Negative for unexpected weight change.  HENT: Negative for rhinorrhea.   Respiratory: Negative for cough and shortness of breath.   Cardiovascular: Negative for chest pain.  Gastrointestinal: Negative for diarrhea.       Constipation has improved since last visit  Genitourinary: Negative for difficulty urinating.  Musculoskeletal: Negative for arthralgias and myalgias.  Skin: Negative for rash.  Allergic/Immunologic: Negative for environmental allergies.  Neurological: Negative for dizziness and numbness.       Headaches improved since initiation of Topamax.  Psychiatric/Behavioral: The patient is not nervous/anxious.        Past Medical History:  Diagnosis Date  . Bleeding duodenal ulcer   . Depression   . GERD (gastroesophageal reflux disease)   . Hypothyroidism   . Kidney stones   . Migraine headache    only1-2x/month since starting topamax     Social History   Socioeconomic History  . Marital status: Married    Spouse name: Not on file  . Number of children: Not on file  . Years of education: Not on file  . Highest education level: Not on file  Occupational History  . Not on file  Social Needs  . Financial resource strain: Not on file  . Food insecurity    Worry: Not on file   Inability: Not on file  . Transportation needs    Medical: Not on file    Non-medical: Not on file  Tobacco Use  . Smoking status: Never Smoker  . Smokeless tobacco: Never Used  Substance and Sexual Activity  . Alcohol use: Not Currently    Alcohol/week: 0.0 standard drinks  . Drug use: No  . Sexual activity: Yes    Birth control/protection: None  Lifestyle  . Physical activity    Days per week: Not on file    Minutes per session: Not on file  . Stress: Not on file  Relationships  . Social Herbalist on phone: Not on file    Gets together: Not on file    Attends religious service: Not on file    Active member of club or organization: Not on file    Attends meetings of clubs or organizations: Not on file    Relationship status: Not on file  . Intimate partner violence    Fear of current or ex partner: Not on file    Emotionally abused: Not on file    Physically abused: Not on file    Forced sexual activity: Not on file  Other Topics Concern  . Not on file  Social History Narrative  . Not on file    Past Surgical History:  Procedure Laterality Date  . ABDOMINAL HYSTERECTOMY  2004   partial  . CHOLECYSTECTOMY    . COLONOSCOPY WITH PROPOFOL N/A 02/27/2018  Procedure: COLONOSCOPY WITH PROPOFOL;  Surgeon: [name], MD;  Location: [place];  Service: Endoscopy;  Laterality: N/A;  . ESOPHAGOGASTRODUODENOSCOPY (EGD) WITH PROPOFOL N/A 01/12/2018   Procedure: ESOPHAGOGASTRODUODENOSCOPY (EGD) WITH BIOPSIES;  Surgeon: [name], MD;  Location: [place];  Service: Endoscopy;  Laterality: N/A;  . ESOPHAGOGASTRODUODENOSCOPY (EGD) WITH PROPOFOL N/A 01/25/2018   Procedure: ESOPHAGOGASTRODUODENOSCOPY (EGD) WITH PROPOFOL;  Surgeon: Jonathon Bellows, MD;  Location: [facility] ENDOSCOPY;  Service: Gastroenterology;  Laterality: N/A;  . ESOPHAGOGASTRODUODENOSCOPY (EGD) WITH PROPOFOL N/A 02/27/2018   Procedure: ESOPHAGOGASTRODUODENOSCOPY (EGD) WITH PROPOFOL;   Surgeon: [name], MD;  Location: [place];  Service: Endoscopy;  Laterality: N/A;  . LAPAROTOMY N/A 01/20/2018   Procedure: EXPLORATORY LAPAROTOMY;  Surgeon: Olean Ree, MD;  Location: [facility];  Service: General;  Laterality: N/A;  . TOOTH EXTRACTION      Family History  Problem Relation Age of Onset  . Cancer Mother   . Breast cancer Mother 13  . Cancer Father   . Alcohol abuse Sister   . Bipolar disorder Sister   . Alcohol abuse Maternal Grandmother   . Stroke Maternal Grandmother   . Stroke Paternal Grandmother     Allergies  Allergen Reactions  . Penicillins Shortness Of Breath, Diarrhea and Nausea And Vomiting    Has patient had a PCN reaction causing immediate rash, facial/tongue/throat swelling, SOB or lightheadedness with hypotension: yes Has patient had a PCN reaction causing severe rash involving mucus membranes or skin necrosis: no Has patient had a PCN reaction that required hospitalization no Has patient had a PCN reaction occurring within the last 10 years: about 10 years If all of the above answers are "NO", then may proceed with Cephalosporin use.   . Sulfa Antibiotics Nausea And Vomiting    Other reaction(s): VOMITING    Current Outpatient Medications on File Prior to Visit  Medication Sig Dispense Refill  . cetirizine (ZYRTEC) 10 MG tablet TAKE 1 TABLET BY MOUTH EVERY DAY AS NEEDED FOR ALLERGY 30 tablet 5  . fluticasone (FLONASE) 50 MCG/ACT nasal spray Place 1 spray into both nostrils 2 (two) times daily. 16 g 0  . levothyroxine (SYNTHROID, LEVOTHROID) 100 MCG tablet Take 1 tablet by mouth every morning on an empty stomach with water. No food or other medications for 30 minutes. 90 tablet 2  . linaclotide (LINZESS) 145 MCG CAPS capsule Take 1 capsule by mouth 30 min before breakfast as needed for constipation. 30 capsule 0  . NONFORMULARY OR COMPOUNDED ITEM See pharmacy note 120 each 2  . oxybutynin (DITROPAN-XL) 5 MG 24 hr tablet Take 1  tablet (5 mg total) by mouth at bedtime. For overactive bladder. 30 tablet 0  . pantoprazole (PROTONIX) 40 MG tablet Take 1 tablet (40 mg total) by mouth 2 (two) times daily. 60 tablet 11  . sertraline (ZOLOFT) 50 MG tablet Take 1 tablet (50 mg total) by mouth daily. For anxiety. 90 tablet 1  . SUMAtriptan (IMITREX) 25 MG tablet TAKE 1 TABLET EVERY 2 HOURS AS NEEDED FOR MIGRAINE, MAY REPEAT IN 2 HOURS IF HEADACHE PERSIST/RECURS 9 tablet 0  . topiramate (TOPAMAX) 25 MG tablet Take 1 tablet by mouth at bedtime for migraine prevention. 90 tablet 1  . traZODone (DESYREL) 150 MG tablet Take 1 tablet (150 mg total) by mouth at bedtime. 90 tablet 1   No current facility-administered medications on file prior to visit.     BP 118/80   Pulse 71   Temp 97.7 F (36.5 C) (  Oral)   Ht 4\' 11"  (1.499 m)   Wt 95 lb 8 oz (43.3 kg)   SpO2 97%   BMI 19.29 kg/m    Objective:   Physical Exam  Constitutional: She is oriented to person, place, and time. She appears well-nourished.  HENT:  Mouth/Throat: No oropharyngeal exudate.  Eyes: Pupils are equal, round, and reactive to light. EOM are normal.  Neck: Neck supple. No thyromegaly present.  Cardiovascular: Normal rate and regular rhythm.  Respiratory: Effort normal and breath sounds normal.  GI: Soft. Bowel sounds are normal. There is no abdominal tenderness.  Musculoskeletal: Normal range of motion.  Neurological: She is alert and oriented to person, place, and time.  Skin: Skin is warm and dry.  Psychiatric: She has a normal mood and affect.           Assessment & Plan:

## 2018-08-29 NOTE — Assessment & Plan Note (Signed)
Chronic, improved with her mother's Linzess.  Rx for Linzess is under prior authorization. Discussed instructions for use.

## 2018-08-29 NOTE — Assessment & Plan Note (Signed)
Following with GI, compliant to pantoprazole 40 mg BID.  Continue same.

## 2018-08-29 NOTE — Telephone Encounter (Signed)
Noted. Discuss with patient that Prior Auth was done

## 2018-08-29 NOTE — Assessment & Plan Note (Signed)
Doing well on sertraline 50 mg daily and trazodone 150 mg HS. Continue same. Denies SI/HI.

## 2018-08-30 ENCOUNTER — Telehealth: Payer: Self-pay

## 2018-08-30 NOTE — Telephone Encounter (Signed)
-----   Message from Simone Curia sent at 08/30/2018  8:39 AM EDT ----- Regarding: Gabapentin reaction Please call pt back concerning Gabapentin. Patient states that it is making her hair fall out in clumps? Thank you. [phone]

## 2018-08-30 NOTE — Telephone Encounter (Signed)
I spoke with patient, she stated that the Gabapentin was causing her hair to come out in clumps after starting medication.  She has currently stopped and pcp is checking blood work.  Is there another medication you would like to try for her neuropathy?  Please advise

## 2018-09-03 ENCOUNTER — Other Ambulatory Visit: Payer: Self-pay | Admitting: Primary Care

## 2018-09-03 DIAGNOSIS — N3941 Urge incontinence: Secondary | ICD-10-CM

## 2018-09-04 ENCOUNTER — Telehealth: Payer: Self-pay | Admitting: Primary Care

## 2018-09-04 ENCOUNTER — Telehealth: Payer: Self-pay
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
## 2018-09-04 NOTE — Telephone Encounter (Signed)
Best number [phone] Pt stated her insurance has not approved rx for linzess.  [facility] told her it could take 1 or 2 weeks before they approve this. Pt wants to know  What she needs to do.  She stated over the counter med do not help her go to the bathroom.

## 2018-09-04 NOTE — Telephone Encounter (Signed)
I did hear anything back yet. I went ahead and resent the prior auth.

## 2018-09-04 NOTE — Telephone Encounter (Signed)
Noted, please update patient. Also update me once prior authorization returns.

## 2018-09-04 NOTE — Telephone Encounter (Signed)
NotedVallarie Nguyen, will you check on her Tuesday June 30th?

## 2018-09-04 NOTE — Telephone Encounter (Signed)
Last prescribed on 08/11/2018 . Last appointment on 08/29/2018. No future appointment

## 2018-09-05 NOTE — Telephone Encounter (Signed)
received letter that prior auth was denied. Placed letter in [place] to review. Patient also have appointment on 09/06/2018

## 2018-09-05 NOTE — Telephone Encounter (Signed)
Spoken to patient but she was more concern regarding vaginal pain. I have schedule her an appointment on 09/06/2018

## 2018-09-05 NOTE — Telephone Encounter (Signed)
Noted, will discuss during upcoming visit.

## 2018-09-05 NOTE — Telephone Encounter (Signed)
Noted, refill sent to pharmacy. 

## 2018-09-06 ENCOUNTER — Encounter: Payer: Self-pay | Admitting: Primary Care

## 2018-09-06 ENCOUNTER — Ambulatory Visit (INDEPENDENT_AMBULATORY_CARE_PROVIDER_SITE_OTHER)
Admission: RE | Admit: 2018-09-06 | Discharge: 2018-09-06 | Disposition: A | Discharge status: Home / Self Care | Payer: PRIVATE HEALTH INSURANCE | Source: Ambulatory Visit | Attending: Primary Care | Admitting: Primary Care

## 2018-09-06 ENCOUNTER — Ambulatory Visit (INDEPENDENT_AMBULATORY_CARE_PROVIDER_SITE_OTHER): Payer: PRIVATE HEALTH INSURANCE | Admitting: Primary Care

## 2018-09-06 ENCOUNTER — Other Ambulatory Visit: Payer: Self-pay

## 2018-09-06 VITALS — BP 126/72 | HR 102 | Temp 98.2°F | Ht 59.0 in | Wt 96.5 lb

## 2018-09-06 DIAGNOSIS — N898 Other specified noninflammatory disorders of vagina: Secondary | ICD-10-CM

## 2018-09-06 DIAGNOSIS — T300 Burn of unspecified body region, unspecified degree: Secondary | ICD-10-CM

## 2018-09-06 DIAGNOSIS — R140 Abdominal distension (gaseous): Secondary | ICD-10-CM

## 2018-09-06 DIAGNOSIS — R300 Dysuria: Secondary | ICD-10-CM | POA: Diagnosis not present

## 2018-09-06 DIAGNOSIS — K5900 Constipation, unspecified: Secondary | ICD-10-CM

## 2018-09-06 DIAGNOSIS — X131XXA Other contact with steam and other hot vapors, initial encounter: Secondary | ICD-10-CM

## 2018-09-06 LAB — POC URINALSYSI DIPSTICK (AUTOMATED)
Bilirubin, UA: NEGATIVE
Glucose, UA: NEGATIVE
Ketones, UA: NEGATIVE
Nitrite, UA: NEGATIVE
Protein, UA: NEGATIVE
Spec Grav, UA: 1.015 (ref 1.010–1.025)
Urobilinogen, UA: 0.2 E.U./dL
pH, UA: 5.5 (ref 5.0–8.0)

## 2018-09-06 MED ORDER — SILVER SULFADIAZINE 1 % EX CREA: 1.0000 "application " | TOPICAL_CREAM | Freq: Two times a day (BID) | CUTANEOUS | 0 refills | Status: DC

## 2018-09-06 NOTE — Assessment & Plan Note (Signed)
UA today with 1+ leuks, trace blood. Culture sent. Wet prep collected. Await culture and vaginal specimen.

## 2018-09-06 NOTE — Assessment & Plan Note (Signed)
Mildly noted today. Abdominal xray pending today. Encouraged to continue with stool softeners. If xray without clear cause for distention and her distention does not improve with stool softeners then we will have her call her GI doc for evaluation.
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
## 2018-09-06 NOTE — Assessment & Plan Note (Signed)
Insurance will not cover Linzess.  Doing well on two stool softeners daily, continue same.  Xray of abdomen pending.

## 2018-09-07 ENCOUNTER — Telehealth: Payer: Self-pay

## 2018-09-07 NOTE — Telephone Encounter (Signed)
Spoken and notified patient of Kate Clark's comments. Patient verbalized understanding.  

## 2018-09-07 NOTE — Telephone Encounter (Signed)
[place] Night - Client Nonclinical Telephone Record AccessNurse Client [facility] Night - Client Client Site [place] Physician Alma Friendly - NP Contact Type Call Who Is Calling Patient / Member / Family / Caregiver [name] Name [place] Phone Number [phone] Call Type Message Only Information Provided Reason for Call Returning a Call from the Office Initial [place] states she is waiting on them to call her.

## 2018-09-08 LAB — URINE CULTURE
MICRO NUMBER:: 626050
SPECIMEN QUALITY:: ADEQUATE

## 2018-09-08 LAB — WET PREP BY MOLECULAR PROBE
Candida species: NOT DETECTED
Gardnerella vaginalis: NOT DETECTED
MICRO NUMBER:: 626049
SPECIMEN QUALITY:: ADEQUATE
Trichomonas vaginosis: NOT DETECTED

## 2018-09-11 ENCOUNTER — Telehealth: Payer: Self-pay | Admitting: Primary Care

## 2018-09-11 ENCOUNTER — Other Ambulatory Visit: Payer: Self-pay | Admitting: Primary Care

## 2018-09-11 DIAGNOSIS — N3001 Acute cystitis with hematuria: Secondary | ICD-10-CM

## 2018-09-11 DIAGNOSIS — K219 Gastro-esophageal reflux disease without esophagitis: Secondary | ICD-10-CM

## 2018-09-11 DIAGNOSIS — K5900 Constipation, unspecified: Secondary | ICD-10-CM

## 2018-09-11 MED ORDER — CEPHALEXIN 500 MG PO CAPS: 500.0000 mg | ORAL_CAPSULE | Freq: Two times a day (BID) | ORAL | 0 refills | Status: AC

## 2018-09-11 NOTE — Telephone Encounter (Signed)
Patient is calling to see if her lab results have come back  Best Phone # - [phone]

## 2018-09-11 NOTE — Telephone Encounter (Signed)
Addressed in results note

## 2018-09-22 ENCOUNTER — Other Ambulatory Visit: Payer: Self-pay

## 2018-09-22 ENCOUNTER — Ambulatory Visit (INDEPENDENT_AMBULATORY_CARE_PROVIDER_SITE_OTHER): Payer: PRIVATE HEALTH INSURANCE | Admitting: Primary Care

## 2018-09-22 ENCOUNTER — Encounter: Payer: Self-pay | Admitting: Primary Care

## 2018-09-22 DIAGNOSIS — L659 Nonscarring hair loss, unspecified: Secondary | ICD-10-CM | POA: Diagnosis not present

## 2018-09-22 HISTORY — DX: Nonscarring hair loss, unspecified: L65.9

## 2018-09-22 NOTE — Patient Instructions (Signed)
Continue taking your vitamins and thyroid medication as prescribed.  Continue to monitor for hair loss as discussed, especially during cooler months.  It was a pleasure to see you today!

## 2018-09-22 NOTE — Progress Notes (Signed)
Subjective:    Patient ID: Tanya Nguyen, female    DOB: 1965/12/30, 53 y.o.   MRN: [id number]  HPI  Tanya Nguyen is a 53 year old female with a history of hypothyroidism, anxiety, vitamin B12 and D deficiency, chronic constipation who presents today with a chief complaint of hair loss.  She endorses that her hair is "falling out in lumps". This has been present for the last several months intermittently, began when she was initiated on gabapentin. The loss has slowed down since she stopped gabapentin. She washes her hair every four days on average, uses a "wet brush". She's tried expensive shampoos at [place] recently without improvement.   She denies scalp itching, changes to medications, increased stress/anxiety. Her last TSH was 0.75 in late June 2020. She is compliant to her vitamin B 12 and vitamin D.  Review of Systems  Respiratory: Negative for shortness of breath.   Cardiovascular: Negative for chest pain.  Skin: Negative for color change and rash.       Hair loss  Psychiatric/Behavioral: The patient is not nervous/anxious.        Past Medical History:  Diagnosis Date  . Acquired pyloric stricture   . Bleeding duodenal ulcer   . Chronic esophagogastric ulcer   . Depression   . Duodenal obstruction   . Gastric outlet obstruction 01/19/2018  . GERD (gastroesophageal reflux disease)   . Hypothyroidism   . Intractable vomiting   . Kidney stones   . Migraine headache    only1-2x/month since starting topamax     Social History   Socioeconomic History  . Marital status: Married    Spouse name: Not on file  . Number of children: Not on file  . Years of education: Not on file  . Highest education level: Not on file  Occupational History  . Not on file  Social Needs  . Financial resource strain: Not on file  . Food insecurity    Worry: Not on file    Inability: Not on file  . Transportation needs    Medical: Not on file    Non-medical: Not on file   Tobacco Use  . Smoking status: Never Smoker  . Smokeless tobacco: Never Used  Substance and Sexual Activity  . Alcohol use: Not Currently    Alcohol/week: 0.0 standard drinks  . Drug use: No  . Sexual activity: Yes    Birth control/protection: None  Lifestyle  . Physical activity    Days per week: Not on file    Minutes per session: Not on file  . Stress: Not on file  Relationships  . Social Herbalist on phone: Not on file    Gets together: Not on file    Attends religious service: Not on file    Active member of club or organization: Not on file    Attends meetings of clubs or organizations: Not on file    Relationship status: Not on file  . Intimate partner violence    Fear of current or ex partner: Not on file    Emotionally abused: Not on file    Physically abused: Not on file    Forced sexual activity: Not on file  Other Topics Concern  . Not on file  Social History Narrative  . Not on file    Past Surgical History:  Procedure Laterality Date  . ABDOMINAL HYSTERECTOMY  2004   partial  . CHOLECYSTECTOMY    . COLONOSCOPY WITH  PROPOFOL N/A 02/27/2018   Procedure: COLONOSCOPY WITH PROPOFOL;  Surgeon: [name], MD;  Location: [place];  Service: Endoscopy;  Laterality: N/A;  . ESOPHAGOGASTRODUODENOSCOPY (EGD) WITH PROPOFOL N/A 01/12/2018   Procedure: ESOPHAGOGASTRODUODENOSCOPY (EGD) WITH BIOPSIES;  Surgeon: [name], MD;  Location: [place];  Service: Endoscopy;  Laterality: N/A;  . ESOPHAGOGASTRODUODENOSCOPY (EGD) WITH PROPOFOL N/A 01/25/2018   Procedure: ESOPHAGOGASTRODUODENOSCOPY (EGD) WITH PROPOFOL;  Surgeon: Jonathon Bellows, MD;  Location: [facility] END[facility]COPY;  Service: Gastroenterology;  Laterality: N/A;  . ESOPHAGOGASTRODUODENOSCOPY (EGD) WITH PROPOFOL N/A 02/27/2018   Procedure: ESOPHAGOGASTRODUODENOSCOPY (EGD) WITH PROPOFOL;  Surgeon: [name], MD;  Location: [place];  Service: Endoscopy;  Laterality: N/A;  .  LAPAROTOMY N/A 01/20/2018   Procedure: EXPLORATORY LAPAROTOMY;  Surgeon: Olean Ree, MD;  Location: [facility];  Service: General;  Laterality: N/A;  . TOOTH EXTRACTION      Family History  Problem Relation Age of Onset  . Cancer Mother   . Breast cancer Mother 18  . Cancer Father   . Alcohol abuse Sister   . Bipolar disorder Sister   . Alcohol abuse Maternal Grandmother   . Stroke Maternal Grandmother   . Stroke Paternal Grandmother     Allergies  Allergen Reactions  . Penicillins Shortness Of Breath, Diarrhea and Nausea And Vomiting    Has patient had a PCN reaction causing immediate rash, facial/tongue/throat swelling, SOB or lightheadedness with hypotension: yes Has patient had a PCN reaction causing severe rash involving mucus membranes or skin necrosis: no Has patient had a PCN reaction that required hospitalization no Has patient had a PCN reaction occurring within the last 10 years: about 10 years If all of the above answers are "NO", then may proceed with Cephalosporin use.   . Sulfa Antibiotics Nausea And Vomiting    Other reaction(s): VOMITING    Current Outpatient Medications on File Prior to Visit  Medication Sig Dispense Refill  . cetirizine (ZYRTEC) 10 MG tablet TAKE 1 TABLET BY MOUTH EVERY DAY AS NEEDED FOR ALLERGY 30 tablet 5  . fluticasone (FLONASE) 50 MCG/ACT nasal spray Place 1 spray into both nostrils 2 (two) times daily. 16 g 0  . levothyroxine (SYNTHROID, LEVOTHROID) 100 MCG tablet Take 1 tablet by mouth every morning on an empty stomach with water. No food or other medications for 30 minutes. 90 tablet 2  . NONFORMULARY OR COMPOUNDED ITEM See pharmacy note 120 each 2  . oxybutynin (DITROPAN-XL) 5 MG 24 hr tablet TAKE 1 TABLET (5 MG TOTAL) BY MOUTH AT BEDTIME. FOR OVERACTIVE BLADDER. 90 tablet 0  . pantoprazole (PROTONIX) 40 MG tablet Take 1 tablet (40 mg total) by mouth 2 (two) times daily. 60 tablet 11  . sertraline (ZOLOFT) 50 MG tablet Take 1 tablet  (50 mg total) by mouth daily. For anxiety. 90 tablet 1  . silver sulfADIAZINE (SILVADENE) 1 % cream Apply 1 application topically 2 (two) times daily. 50 g 0  . SUMAtriptan (IMITREX) 25 MG tablet TAKE 1 TABLET EVERY 2 HOURS AS NEEDED FOR MIGRAINE, MAY REPEAT IN 2 HOURS IF HEADACHE PERSIST/RECURS 9 tablet 0  . topiramate (TOPAMAX) 25 MG tablet Take 1 tablet by mouth at bedtime for migraine prevention. 90 tablet 1  . traZODone (DESYREL) 150 MG tablet Take 1 tablet (150 mg total) by mouth at bedtime. 90 tablet 1   No current facility-administered medications on file prior to visit.     BP 124/74   Pulse 85   Temp 98.2 F (36.8 C) (  Temporal)   Ht 4\' 11"  (1.499 m)   Wt 95 lb 12 oz (43.4 kg)   SpO2 98%   BMI 19.34 kg/m    Objective:   Physical Exam  Constitutional: She appears well-nourished.  Neck: Neck supple.  Cardiovascular: Normal rate and regular rhythm.  Respiratory: Effort normal and breath sounds normal.  Skin: Skin is warm and dry.  No balding spots on scalp. No rash. No evidence of fungal involvement.  Overall hair does appear thinner but no massive loss noted.  Psychiatric: She has a normal mood and affect.           Assessment & Plan:

## 2018-09-22 NOTE — Assessment & Plan Note (Signed)
Present for the last several months. Exam today without balding or evidence of massive loss.  Differential causes include decreased hormone levels from natural menopause as she does still have ovaries. Also discussed natural increase in hair shedding during warmer months. TSH within normal range on recent labs.  No obvious rashes or evidence of fungal cause for loss.   Encouraged patient to monitor symptoms and report continued loss during cooler months. She will update.

## 2018-09-25 ENCOUNTER — Other Ambulatory Visit: Payer: Self-pay | Admitting: Podiatry

## 2018-09-25 NOTE — Telephone Encounter (Signed)
Refill okay. Dr. Amalia Hailey

## 2018-09-25 NOTE — Telephone Encounter (Signed)
Dr. Amalia Hailey please advice on refill

## 2018-09-26 ENCOUNTER — Other Ambulatory Visit: Payer: Self-pay

## 2018-09-26 MED ORDER — MELOXICAM 7.5 MG PO TABS: 7.5000 mg | ORAL_TABLET | Freq: Every day | ORAL | 0 refills | Status: DC

## 2018-09-26 NOTE — Telephone Encounter (Signed)
Rx refill for Meloxicam sent

## 2018-09-27 ENCOUNTER — Other Ambulatory Visit: Payer: Self-pay

## 2018-09-27 ENCOUNTER — Ambulatory Visit: Payer: PRIVATE HEALTH INSURANCE | Admitting: Gastroenterology

## 2018-09-27 ENCOUNTER — Ambulatory Visit (INDEPENDENT_AMBULATORY_CARE_PROVIDER_SITE_OTHER): Payer: PRIVATE HEALTH INSURANCE | Admitting: Gastroenterology

## 2018-09-27 ENCOUNTER — Encounter: Payer: Self-pay | Admitting: Gastroenterology

## 2018-09-27 ENCOUNTER — Other Ambulatory Visit (INDEPENDENT_AMBULATORY_CARE_PROVIDER_SITE_OTHER): Payer: PRIVATE HEALTH INSURANCE

## 2018-09-27 VITALS — BP 97/63 | HR 99 | Temp 98.3°F | Ht 59.0 in | Wt 96.6 lb

## 2018-09-27 DIAGNOSIS — N3001 Acute cystitis with hematuria: Secondary | ICD-10-CM

## 2018-09-27 DIAGNOSIS — K5901 Slow transit constipation: Secondary | ICD-10-CM

## 2018-09-27 DIAGNOSIS — K5909 Other constipation: Secondary | ICD-10-CM

## 2018-09-27 LAB — POC URINALSYSI DIPSTICK (AUTOMATED)
Bilirubin, UA: NEGATIVE
Blood, UA: NEGATIVE
Glucose, UA: NEGATIVE
Ketones, UA: NEGATIVE
Leukocytes, UA: NEGATIVE
Nitrite, UA: NEGATIVE
Protein, UA: NEGATIVE
Spec Grav, UA: 1.025 (ref 1.010–1.025)
Urobilinogen, UA: 0.2 E.U./dL
pH, UA: 5 (ref 5.0–8.0)

## 2018-09-27 NOTE — Progress Notes (Signed)
Primary Care Physician: Tanya Koch, NP  Primary Gastroenterologist:  Dr. [name]  Chief Complaint  Patient presents with  . Follow up constipation    HPI: Tanya Nguyen is a 53 y.o. female here for follow-up of her chronic constipation.  The patient states that she was prescribed Linzess but it was too expensive for her to take.  The patient now reports that she has stopped her BCs and Goody powders which previously had caused her to have significant ulcerations of her stomach.  The patient continues to have constipation with bloating and has had a KUB showing her to have a large amount of stool in her colon.  The patient states that she will take 4 laxative pills at a time and then get diarrhea.  She is not taking anything on a regular basis.  The patient tried to take a fiber supplement which resulted in her having nausea since she does not like the taste of many things.  Current Outpatient Medications  Medication Sig Dispense Refill  . cetirizine (ZYRTEC) 10 MG tablet TAKE 1 TABLET BY MOUTH EVERY DAY AS NEEDED FOR ALLERGY 30 tablet 5  . fluticasone (FLONASE) 50 MCG/ACT nasal spray Place 1 spray into both nostrils 2 (two) times daily. 16 g 0  . levothyroxine (SYNTHROID, LEVOTHROID) 100 MCG tablet Take 1 tablet by mouth every morning on an empty stomach with water. No food or other medications for 30 minutes. 90 tablet 2  . meloxicam (MOBIC) 7.5 MG tablet Take 1 tablet (7.5 mg total) by mouth daily. 30 tablet 0  . NONFORMULARY OR COMPOUNDED ITEM See pharmacy note 120 each 2  . oxybutynin (DITROPAN-XL) 5 MG 24 hr tablet TAKE 1 TABLET (5 MG TOTAL) BY MOUTH AT BEDTIME. FOR OVERACTIVE BLADDER. 90 tablet 0  . pantoprazole (PROTONIX) 40 MG tablet Take 1 tablet (40 mg total) by mouth 2 (two) times daily. 60 tablet 11  . sertraline (ZOLOFT) 50 MG tablet Take 1 tablet (50 mg total) by mouth daily. For anxiety. 90 tablet 1  . silver sulfADIAZINE (SILVADENE) 1 % cream Apply 1 application  topically 2 (two) times daily. 50 g 0  . SUMAtriptan (IMITREX) 25 MG tablet TAKE 1 TABLET EVERY 2 HOURS AS NEEDED FOR MIGRAINE, MAY REPEAT IN 2 HOURS IF HEADACHE PERSIST/RECURS 9 tablet 0  . topiramate (TOPAMAX) 25 MG tablet Take 1 tablet by mouth at bedtime for migraine prevention. 90 tablet 1  . traZODone (DESYREL) 150 MG tablet Take 1 tablet (150 mg total) by mouth at bedtime. 90 tablet 1   No current facility-administered medications for this visit.     Allergies as of 09/27/2018 - Review Complete 09/27/2018  Allergen Reaction Noted  . Penicillins Shortness Of Breath, Diarrhea, and Nausea And Vomiting 02/26/2015  . Sulfa antibiotics Nausea And Vomiting 06/26/2012    ROS:  General: Negative for anorexia, weight loss, fever, chills, fatigue, weakness. ENT: Negative for hoarseness, difficulty swallowing , nasal congestion. CV: Negative for chest pain, angina, palpitations, dyspnea on exertion, peripheral edema.  Respiratory: Negative for dyspnea at rest, dyspnea on exertion, cough, sputum, wheezing.  GI: See history of present illness. GU:  Negative for dysuria, hematuria, urinary incontinence, urinary frequency, nocturnal urination.  Endo: Negative for unusual weight change.    Physical Examination:   BP 97/63   Pulse 99   Temp 98.3 F (36.8 C) (Oral)   Ht 4\' 11"  (1.499 m)   Wt 96 lb 9.6 oz (43.8 kg)   BMI 19.51  kg/m   General: Well-nourished, well-developed in no acute distress.  Eyes: No icterus. Conjunctivae pink. Mouth: Oropharyngeal mucosa moist and pink , no lesions erythema or exudate. Lungs: Clear to auscultation bilaterally. Non-labored. Heart: Regular rate and rhythm, no murmurs rubs or gallops.  Abdomen: Bowel sounds are normal, nontender, nondistended, no hepatosplenomegaly or masses, no abdominal bruits or hernia , no rebound or guarding.   Extremities: No lower extremity edema. No clubbing or deformities. Neuro: Alert and oriented x 3.  Grossly intact. Skin:  Warm and dry, no jaundice.   Psych: Alert and cooperative, normal mood and affect.  Labs:    Imaging Studies: Dg Abd 2 Views  Result Date: 09/06/2018 CLINICAL DATA:  Abdominal distension.  Constipation. EXAM: ABDOMEN - 2 VIEW COMPARISON:  Radiographs of August 25, 2018. FINDINGS: Status post cholecystectomy. Large amount of stool remains throughout the colon although it is decreased compared to prior exam. No abnormal bowel dilatation is noted. Postsurgical changes are noted in left upper quadrant. No abnormal calcifications are noted. IMPRESSION: Large stool burden is noted which is decreased compared to prior exam. No abnormal bowel dilatation is noted. Electronically Signed   By: Marijo Conception M.D.   On: 09/06/2018 13:54    Assessment and Plan:   Tanya Nguyen is a 53 y.o. y/o female who comes in with a history of constipation and a colonoscopy that did not show any masses or blockage to her colon.  The patient has been told to take something on a daily basis to keep her bowels moving.  The patient could not afford the Linzess because it was not covered by her insurance.  The patient will be started on a trial of Truelance and she has been given samples of this.  The patient has also been told to titrate MiraLAX daily so that she has a bowel movement once every day to once every other day.  Patient will contact me if her symptoms do not improve.  The patient and her husband have been explained the plan and agree with it.    [name], MD. [name]   Note: This dictation was prepared with Dragon dictation along with smaller phrase technology. Any transcriptional errors that result from this process are unintentional.

## 2018-09-28 LAB — URINE CULTURE
MICRO NUMBER:: 692995
Result:: NO GROWTH
SPECIMEN QUALITY:: ADEQUATE

## 2018-10-03 ENCOUNTER — Other Ambulatory Visit: Payer: Self-pay | Admitting: Primary Care

## 2018-10-03 DIAGNOSIS — Z1231 Encounter for screening mammogram for malignant neoplasm of breast: Secondary | ICD-10-CM

## 2018-10-04 ENCOUNTER — Telehealth: Payer: Self-pay | Admitting: Gastroenterology

## 2018-10-04 ENCOUNTER — Other Ambulatory Visit: Payer: Self-pay

## 2018-10-04 ENCOUNTER — Other Ambulatory Visit: Payer: Self-pay | Admitting: Gastroenterology

## 2018-10-04 MED ORDER — TRULANCE 3 MG PO TABS: 3.0000 mg | ORAL_TABLET | Freq: Every day | ORAL | 6 refills | Status: DC

## 2018-10-04 NOTE — Telephone Encounter (Signed)
Pt is calling she needs rx Trulance  And see if her insurance requires  Prior authorization  cb [phone]

## 2018-10-04 NOTE — Telephone Encounter (Signed)
Spoke with pt and advised her the rx for Trulance has been sent to her pharmacy.

## 2018-10-05 ENCOUNTER — Telehealth: Payer: Self-pay | Admitting: Gastroenterology

## 2018-10-05 ENCOUNTER — Other Ambulatory Visit: Payer: Self-pay

## 2018-10-05 NOTE — Telephone Encounter (Signed)
Pt was already informed of this yesterday when I spoke with her. She is aware that I would be taking care of this and will contact her when I get it approved.

## 2018-10-05 NOTE — Telephone Encounter (Signed)
Pt is calling her Trulance requires Prior authorization to [facility]  cb [phone]

## 2018-10-09 ENCOUNTER — Telehealth: Payer: Self-pay | Admitting: Gastroenterology

## 2018-10-09 ENCOUNTER — Other Ambulatory Visit: Payer: Self-pay

## 2018-10-09 MED ORDER — LINACLOTIDE 290 MCG PO CAPS: 290.0000 ug | ORAL_CAPSULE | Freq: Every day | ORAL | 6 refills | Status: DC

## 2018-10-09 NOTE — Telephone Encounter (Signed)
Patient called in stating she called last week & has not heard back about [employer] covering Trulance 3mg  . Please call patient & advise.

## 2018-10-10 ENCOUNTER — Other Ambulatory Visit: Payer: Self-pay

## 2018-10-10 NOTE — Telephone Encounter (Signed)
Pt notified [employer] has denied her Trulance.

## 2018-10-16 ENCOUNTER — Other Ambulatory Visit: Payer: Self-pay

## 2018-10-18 ENCOUNTER — Ambulatory Visit: Payer: PRIVATE HEALTH INSURANCE | Admitting: Primary Care

## 2018-10-18 ENCOUNTER — Ambulatory Visit (INDEPENDENT_AMBULATORY_CARE_PROVIDER_SITE_OTHER): Payer: PRIVATE HEALTH INSURANCE | Admitting: Gastroenterology

## 2018-10-18 ENCOUNTER — Encounter: Payer: Self-pay | Admitting: Gastroenterology

## 2018-10-18 ENCOUNTER — Other Ambulatory Visit: Payer: Self-pay

## 2018-10-18 VITALS — BP 105/57 | HR 87 | Temp 98.5°F | Ht 59.0 in | Wt 98.4 lb

## 2018-10-18 DIAGNOSIS — K432 Incisional hernia without obstruction or gangrene: Secondary | ICD-10-CM

## 2018-10-18 DIAGNOSIS — K581 Irritable bowel syndrome with constipation: Secondary | ICD-10-CM | POA: Diagnosis not present

## 2018-10-18 NOTE — Progress Notes (Signed)
Primary Care Physician: Pleas Koch, NP  Primary Gastroenterologist:  Dr. [name]  Chief Complaint  Patient presents with  . Abdominal Pain    HPI: Tanya Nguyen is a 53 y.o. female here this patient is here with continued constipation and abdominal discomfort.  The patient reports that she has a roundness in her abdominal wall right near her surgery which gets bigger when she eats.  She states it sometimes gets hard and when she pushes it back and she hears gurgling noises.  She also states that she is going to try Amitiza since Linzess and Trulance were not covered by her insurance.  She states that they both helped but Linzess was over $200 a month for her which was too expensive.  Current Outpatient Medications  Medication Sig Dispense Refill  . cetirizine (ZYRTEC) 10 MG tablet TAKE 1 TABLET BY MOUTH EVERY DAY AS NEEDED FOR ALLERGY 30 tablet 5  . fluticasone (FLONASE) 50 MCG/ACT nasal spray Place 1 spray into both nostrils 2 (two) times daily. 16 g 0  . levothyroxine (SYNTHROID, LEVOTHROID) 100 MCG tablet Take 1 tablet by mouth every morning on an empty stomach with water. No food or other medications for 30 minutes. 90 tablet 2  . linaclotide (LINZESS) 290 MCG CAPS capsule Take 1 capsule (290 mcg total) by mouth daily before breakfast. 30 capsule 6  . meloxicam (MOBIC) 7.5 MG tablet Take 1 tablet (7.5 mg total) by mouth daily. 30 tablet 0  . NONFORMULARY OR COMPOUNDED ITEM See pharmacy note 120 each 2  . oxybutynin (DITROPAN-XL) 5 MG 24 hr tablet TAKE 1 TABLET (5 MG TOTAL) BY MOUTH AT BEDTIME. FOR OVERACTIVE BLADDER. 90 tablet 0  . pantoprazole (PROTONIX) 40 MG tablet Take 1 tablet (40 mg total) by mouth 2 (two) times daily. 60 tablet 11  . Plecanatide (TRULANCE) 3 MG TABS Take 3 mg by mouth daily. 30 tablet 6  . sertraline (ZOLOFT) 50 MG tablet Take 1 tablet (50 mg total) by mouth daily. For anxiety. 90 tablet 1  . silver sulfADIAZINE (SILVADENE) 1 % cream Apply 1  application topically 2 (two) times daily. 50 g 0  . SUMAtriptan (IMITREX) 25 MG tablet TAKE 1 TABLET EVERY 2 HOURS AS NEEDED FOR MIGRAINE, MAY REPEAT IN 2 HOURS IF HEADACHE PERSIST/RECURS 9 tablet 0  . topiramate (TOPAMAX) 25 MG tablet Take 1 tablet by mouth at bedtime for migraine prevention. 90 tablet 1  . traZODone (DESYREL) 150 MG tablet Take 1 tablet (150 mg total) by mouth at bedtime. 90 tablet 1   No current facility-administered medications for this visit.     Allergies as of 10/18/2018 - Review Complete 10/18/2018  Allergen Reaction Noted  . Penicillins Shortness Of Breath, Diarrhea, and Nausea And Vomiting 02/26/2015  . Sulfa antibiotics Nausea And Vomiting 06/26/2012    ROS:  General: Negative for anorexia, weight loss, fever, chills, fatigue, weakness. ENT: Negative for hoarseness, difficulty swallowing , nasal congestion. CV: Negative for chest pain, angina, palpitations, dyspnea on exertion, peripheral edema.  Respiratory: Negative for dyspnea at rest, dyspnea on exertion, cough, sputum, wheezing.  GI: See history of present illness. GU:  Negative for dysuria, hematuria, urinary incontinence, urinary frequency, nocturnal urination.  Endo: Negative for unusual weight change.    Physical Examination:   There were no vitals taken for this visit.  General: Well-nourished, well-developed in no acute distress.  Eyes: No icterus. Conjunctivae pink. Mouth: Oropharyngeal mucosa moist and pink , no lesions erythema or exudate.  Lungs: Clear to auscultation bilaterally. Non-labored. Heart: Regular rate and rhythm, no murmurs rubs or gallops.  Abdomen: Bowel sounds are normal, nontender, nondistended, no hepatosplenomegaly or masses, no abdominal bruits or positive incisional hernia, no rebound or guarding.   Extremities: No lower extremity edema. No clubbing or deformities. Neuro: Alert and oriented x 3.  Grossly intact. Skin: Warm and dry, no jaundice.   Psych: Alert and  cooperative, normal mood and affect.  Labs:    Imaging Studies: No results found.  Assessment and Plan:   [name] is a 53 y.o. y/o female who comes in today with an incisional hernia and abdominal discomfort with chronic constipation.  The patient will try the Amitiza to see if that helps her symptoms.  The patient will be set up to see surgery.  The patient will be seen by Dr. Hampton Abbot who had done her previous surgery.  She was very happy with him.  The patient has been explained the plan and agrees with it.    [name], MD. [name]   Note: This dictation was prepared with Dragon dictation along with smaller phrase technology. Any transcriptional errors that result from this process are unintentional.

## 2018-10-23 ENCOUNTER — Ambulatory Visit: Payer: PRIVATE HEALTH INSURANCE | Admitting: Gastroenterology

## 2018-10-24 ENCOUNTER — Ambulatory Visit (INDEPENDENT_AMBULATORY_CARE_PROVIDER_SITE_OTHER): Payer: PRIVATE HEALTH INSURANCE | Admitting: Primary Care

## 2018-10-24 ENCOUNTER — Encounter: Payer: Self-pay | Admitting: Primary Care

## 2018-10-24 ENCOUNTER — Other Ambulatory Visit: Payer: Self-pay

## 2018-10-24 VITALS — BP 98/74 | HR 88 | Temp 98.3°F | Ht 59.0 in | Wt 100.2 lb

## 2018-10-24 DIAGNOSIS — E538 Deficiency of other specified B group vitamins: Secondary | ICD-10-CM | POA: Diagnosis not present

## 2018-10-24 DIAGNOSIS — E559 Vitamin D deficiency, unspecified: Secondary | ICD-10-CM

## 2018-10-24 DIAGNOSIS — L659 Nonscarring hair loss, unspecified: Secondary | ICD-10-CM | POA: Diagnosis not present

## 2018-10-24 DIAGNOSIS — R748 Abnormal levels of other serum enzymes: Secondary | ICD-10-CM

## 2018-10-24 LAB — CBC
HCT: 38.8 % (ref 36.0–46.0)
Hemoglobin: 12.4 g/dL (ref 12.0–15.0)
MCHC: 31.9 g/dL (ref 30.0–36.0)
MCV: 88.8 fl (ref 78.0–100.0)
Platelets: 315 10*3/uL (ref 150.0–400.0)
RBC: 4.38 Mil/uL (ref 3.87–5.11)
RDW: 18.2 % — ABNORMAL HIGH (ref 11.5–15.5)
WBC: 6.9 10*3/uL (ref 4.0–10.5)

## 2018-10-24 LAB — HEPATIC FUNCTION PANEL
ALT: 29 U/L (ref 0–35)
AST: 33 U/L (ref 0–37)
Albumin: 3.9 g/dL (ref 3.5–5.2)
Alkaline Phosphatase: 81 U/L (ref 39–117)
Bilirubin, Direct: 0 mg/dL (ref 0.0–0.3)
Total Bilirubin: 0.2 mg/dL (ref 0.2–1.2)
Total Protein: 6.4 g/dL (ref 6.0–8.3)

## 2018-10-24 LAB — VITAMIN D 25 HYDROXY (VIT D DEFICIENCY, FRACTURES): VITD: 35.42 ng/mL (ref 30.00–100.00)

## 2018-10-24 LAB — FOLLICLE STIMULATING HORMONE: FSH: 145.8 m[IU]/mL

## 2018-10-24 LAB — VITAMIN B12: Vitamin B-12: 1321 pg/mL — ABNORMAL HIGH (ref 211–911)

## 2018-10-24 LAB — LUTEINIZING HORMONE: LH: 67.71 m[IU]/mL

## 2018-10-24 MED ORDER — LUBIPROSTONE 24 MCG PO CAPS: 24.0000 ug | ORAL_CAPSULE | Freq: Two times a day (BID) | ORAL | 0 refills | Status: DC

## 2018-10-24 NOTE — Assessment & Plan Note (Signed)
Continued although not much evidence of this seen during exam. Overall her hair does appear thinner but she does have thick hear in general.   Discussed that symptoms are likely hormonal, could also be aggravated by stress. Recheck vitamin levels, CBC, FSH/LH.  Recommended she work to reduce stress, use a soft brush when brushing, continue to use mild shampoos.

## 2018-10-24 NOTE — Progress Notes (Signed)
Subjective:    Patient ID: Tanya Nguyen, female    DOB: 1965/04/07, 53 y.o.   MRN: [id number]  HPI  [name] is a 53 year old female with a history of chronic constipation, hypothyroidism, chronic fatigue, Bipolar Depression, hair loss who presents today with a chief complaint of hair loss.  She's been seen for this on several visits; initially after beginning gabapentin months ago for which she stopped immediately, and again in mid July 2020. She's undergone lab work up in late June, thyroid and vitamin levels are within normal range.   She endorses that she's under a lot of stress as she's battling constipation and now may undergo hernia repair surgery. She's also started to notice hot flashes. She denies bald spots, scalp itching, dry skin.  Review of Systems  Respiratory: Negative for shortness of breath.   Cardiovascular: Negative for chest pain.  Skin:       Hair loss  Psychiatric/Behavioral: The patient is nervous/anxious.        Past Medical History:  Diagnosis Date  . Acquired pyloric stricture   . Bleeding duodenal ulcer   . Chronic esophagogastric ulcer   . Depression   . Duodenal obstruction   . Gastric outlet obstruction 01/19/2018  . GERD (gastroesophageal reflux disease)   . Hypothyroidism   . Intractable vomiting   . Kidney stones   . Migraine headache    only1-2x/month since starting topamax     Social History   Socioeconomic History  . Marital status: Married    Spouse name: Not on file  . Number of children: Not on file  . Years of education: Not on file  . Highest education level: Not on file  Occupational History  . Not on file  Social Needs  . Financial resource strain: Not on file  . Food insecurity    Worry: Not on file    Inability: Not on file  . Transportation needs    Medical: Not on file    Non-medical: Not on file  Tobacco Use  . Smoking status: Never Smoker  . Smokeless tobacco: Never Used  Substance and Sexual Activity  .  Alcohol use: Not Currently    Alcohol/week: 0.0 standard drinks  . Drug use: No  . Sexual activity: Yes    Birth control/protection: None  Lifestyle  . Physical activity    Days per week: Not on file    Minutes per session: Not on file  . Stress: Not on file  Relationships  . Social Herbalist on phone: Not on file    Gets together: Not on file    Attends religious service: Not on file    Active member of club or organization: Not on file    Attends meetings of clubs or organizations: Not on file    Relationship status: Not on file  . Intimate partner violence    Fear of current or ex partner: Not on file    Emotionally abused: Not on file    Physically abused: Not on file    Forced sexual activity: Not on file  Other Topics Concern  . Not on file  Social History Narrative  . Not on file    Past Surgical History:  Procedure Laterality Date  . ABDOMINAL HYSTERECTOMY  2004   partial  . CHOLECYSTECTOMY    . COLONOSCOPY WITH PROPOFOL N/A 02/27/2018   Procedure: COLONOSCOPY WITH PROPOFOL;  Surgeon: [name], MD;  Location: [facility];  Service: Endoscopy;  Laterality: N/A;  . ESOPHAGOGASTRODUODENOSCOPY (EGD) WITH PROPOFOL N/A 01/12/2018   Procedure: ESOPHAGOGASTRODUODENOSCOPY (EGD) WITH BIOPSIES;  Surgeon: [name], MD;  Location: [facility];  Service: Endoscopy;  Laterality: N/A;  . ESOPHAGOGASTRODUODENOSCOPY (EGD) WITH PROPOFOL N/A 01/25/2018   Procedure: ESOPHAGOGASTRODUODENOSCOPY (EGD) WITH PROPOFOL;  Surgeon: Jonathon Bellows, MD;  Location: [facility] ENDOSCOPY;  Service: Gastroenterology;  Laterality: N/A;  . ESOPHAGOGASTRODUODENOSCOPY (EGD) WITH PROPOFOL N/A 02/27/2018   Procedure: ESOPHAGOGASTRODUODENOSCOPY (EGD) WITH PROPOFOL;  Surgeon: [name], MD;  Location: [place];  Service: Endoscopy;  Laterality: N/A;  . LAPAROTOMY N/A 01/20/2018   Procedure: EXPLORATORY LAPAROTOMY;  Surgeon: Olean Ree, MD;  Location: [facility];   Service: General;  Laterality: N/A;  . TOOTH EXTRACTION      Family History  Problem Relation Age of Onset  . Cancer Mother   . Breast cancer Mother 31  . Cancer Father   . Alcohol abuse Sister   . Bipolar disorder Sister   . Alcohol abuse Maternal Grandmother   . Stroke Maternal Grandmother   . Stroke Paternal Grandmother     Allergies  Allergen Reactions  . Penicillins Shortness Of Breath, Diarrhea and Nausea And Vomiting    Has patient had a PCN reaction causing immediate rash, facial/tongue/throat swelling, SOB or lightheadedness with hypotension: yes Has patient had a PCN reaction causing severe rash involving mucus membranes or skin necrosis: no Has patient had a PCN reaction that required hospitalization no Has patient had a PCN reaction occurring within the last 10 years: about 10 years If all of the above answers are "NO", then may proceed with Cephalosporin use.   . Sulfa Antibiotics Nausea And Vomiting    Other reaction(s): VOMITING    Current Outpatient Medications on File Prior to Visit  Medication Sig Dispense Refill  . cetirizine (ZYRTEC) 10 MG tablet TAKE 1 TABLET BY MOUTH EVERY DAY AS NEEDED FOR ALLERGY 30 tablet 5  . fluticasone (FLONASE) 50 MCG/ACT nasal spray Place 1 spray into both nostrils 2 (two) times daily. 16 g 0  . levothyroxine (SYNTHROID, LEVOTHROID) 100 MCG tablet Take 1 tablet by mouth every morning on an empty stomach with water. No food or other medications for 30 minutes. 90 tablet 2  . linaclotide (LINZESS) 290 MCG CAPS capsule Take 1 capsule (290 mcg total) by mouth daily before breakfast. 30 capsule 6  . meloxicam (MOBIC) 7.5 MG tablet Take 1 tablet (7.5 mg total) by mouth daily. 30 tablet 0  . NONFORMULARY OR COMPOUNDED ITEM See pharmacy note 120 each 2  . oxybutynin (DITROPAN-XL) 5 MG 24 hr tablet TAKE 1 TABLET (5 MG TOTAL) BY MOUTH AT BEDTIME. FOR OVERACTIVE BLADDER. 90 tablet 0  . pantoprazole (PROTONIX) 40 MG tablet Take 1 tablet (40 mg  total) by mouth 2 (two) times daily. 60 tablet 11  . Plecanatide (TRULANCE) 3 MG TABS Take 3 mg by mouth daily. 30 tablet 6  . sertraline (ZOLOFT) 50 MG tablet Take 1 tablet (50 mg total) by mouth daily. For anxiety. 90 tablet 1  . silver sulfADIAZINE (SILVADENE) 1 % cream Apply 1 application topically 2 (two) times daily. 50 g 0  . SUMAtriptan (IMITREX) 25 MG tablet TAKE 1 TABLET EVERY 2 HOURS AS NEEDED FOR MIGRAINE, MAY REPEAT IN 2 HOURS IF HEADACHE PERSIST/RECURS 9 tablet 0  . topiramate (TOPAMAX) 25 MG tablet Take 1 tablet by mouth at bedtime for migraine prevention. 90 tablet 1  . traZODone (DESYREL) 150 MG tablet Take 1 tablet (150  mg total) by mouth at bedtime. 90 tablet 1   No current facility-administered medications on file prior to visit.     BP 98/74   Pulse 88   Temp 98.3 F (36.8 C) (Temporal)   Ht 4\' 11"  (1.499 m)   Wt 100 lb 4 oz (45.5 kg)   SpO2 97%   BMI 20.25 kg/m    Objective:   Physical Exam  Constitutional: She appears well-nourished.  Neck: Neck supple.  Respiratory: Effort normal.  Skin: Skin is warm and dry.  No bald spots, rash to scalp, dry skin to scalp, lesions to scalp.  Psychiatric: She has a normal mood and affect.           Assessment & Plan:

## 2018-10-24 NOTE — Patient Instructions (Signed)
Stop by the lab prior to leaving today. I will notify you of your results once received.   It was a pleasure to see you today!  

## 2018-10-25 ENCOUNTER — Other Ambulatory Visit: Payer: Self-pay

## 2018-10-25 ENCOUNTER — Ambulatory Visit: Payer: PRIVATE HEALTH INSURANCE | Admitting: Surgery

## 2018-10-25 ENCOUNTER — Encounter: Payer: Self-pay | Admitting: Surgery

## 2018-10-25 VITALS — BP 92/64 | HR 67 | Temp 97.9°F | Ht 59.0 in | Wt 100.8 lb

## 2018-10-25 DIAGNOSIS — K432 Incisional hernia without obstruction or gangrene: Secondary | ICD-10-CM | POA: Diagnosis not present

## 2018-10-25 NOTE — H&P (View-Only) (Signed)
10/25/2018  History of Present Illness: Tanya Nguyen is a 53 y.o. female status post open gastrojejunostomy for gastric outlet obstruction on 01/20/2018.  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  Patient reports that she goes to the bathroom for a bowel movement every 2 to 3 days.  She reports that after eating she feels that her abdomen becomes hard she feels a bulging sensation in the lower portion of the incision.  Denies any associated nausea or vomiting and reports only constipation which is at her baseline.  There is no new worsening pain other than the stable pain at the lower portion of the incision.  She last saw Dr. Allen Norris on 10/18/2018 and after discussing these findings with her, she was found to have an incisional hernia at the lower portion of her wound.  Past Medical History: Past Medical History:  Diagnosis Date  . Acquired pyloric stricture   . Bleeding duodenal ulcer   . Chronic esophagogastric ulcer   . Depression   . Duodenal obstruction   . Gastric outlet obstruction 01/19/2018  . GERD (gastroesophageal reflux disease)   . Hypothyroidism   . Intractable vomiting   . Kidney stones   . Migraine headache    only1-2x/month since starting topamax     Past Surgical History: Past Surgical History:  Procedure Laterality Date  . ABDOMINAL HYSTERECTOMY  2004   partial  . CHOLECYSTECTOMY    . COLONOSCOPY WITH PROPOFOL N/A 02/27/2018   Procedure: COLONOSCOPY WITH PROPOFOL;  Surgeon: [name], MD;  Location: [place];  Service: Endoscopy;  Laterality: N/A;  . ESOPHAGOGASTRODUODENOSCOPY (EGD) WITH PROPOFOL N/A 01/12/2018   Procedure: ESOPHAGOGASTRODUODENOSCOPY (EGD) WITH BIOPSIES;  Surgeon: [name], MD;  Location: [place];  Service: Endoscopy;  Laterality: N/A;  . ESOPHAGOGASTRODUODENOSCOPY (EGD) WITH PROPOFOL N/A 01/25/2018   Procedure: ESOPHAGOGASTRODUODENOSCOPY (EGD) WITH PROPOFOL;  Surgeon: Jonathon Bellows, MD;  Location: [facility]  ENDOSCOPY;  Service: Gastroenterology;  Laterality: N/A;  . ESOPHAGOGASTRODUODENOSCOPY (EGD) WITH PROPOFOL N/A 02/27/2018   Procedure: ESOPHAGOGASTRODUODENOSCOPY (EGD) WITH PROPOFOL;  Surgeon: [name], MD;  Location: [place];  Service: Endoscopy;  Laterality: N/A;  . LAPAROTOMY N/A 01/20/2018   Procedure: EXPLORATORY LAPAROTOMY;  Surgeon: Olean Ree, MD;  Location: [facility];  Service: General;  Laterality: N/A;  . TOOTH EXTRACTION      Home Medications: Prior to Admission medications   Medication Sig Start Date End Date Taking? Authorizing Provider  cetirizine (ZYRTEC) 10 MG tablet TAKE 1 TABLET BY MOUTH EVERY DAY AS NEEDED FOR ALLERGY 07/24/18  Yes Pleas Koch, NP  fluticasone (FLONASE) 50 MCG/ACT nasal spray Place 1 spray into both nostrils 2 (two) times daily. 05/03/18  Yes Pleas Koch, NP  levothyroxine (SYNTHROID, LEVOTHROID) 100 MCG tablet Take 1 tablet by mouth every morning on an empty stomach with water. No food or other medications for 30 minutes. 03/30/18  Yes Pleas Koch, NP  lubiprostone (AMITIZA) 24 MCG capsule Take 1 capsule (24 mcg total) by mouth 2 (two) times daily with a meal. 10/24/18  Yes [name], MD  meloxicam (MOBIC) 7.5 MG tablet Take 1 tablet (7.5 mg total) by mouth daily. Patient taking differently: Take 7.5 mg by mouth as needed.  09/26/18  Yes Edrick Kins, DPM  NONFORMULARY OR COMPOUNDED ITEM See pharmacy note 08/04/17  Yes Edrick Kins, DPM  pantoprazole (PROTONIX) 40 MG tablet Take 1 tablet (40 mg total) by mouth 2 (two) times daily. 03/30/18  Yes [name],  MD  sertraline (ZOLOFT) 50 MG tablet Take 1 tablet (50 mg total) by mouth daily. For anxiety. 05/29/18  Yes Pleas Koch, NP  SUMAtriptan (IMITREX) 25 MG tablet TAKE 1 TABLET EVERY 2 HOURS AS NEEDED FOR MIGRAINE, MAY REPEAT IN 2 HOURS IF HEADACHE PERSIST/RECURS 08/07/18  Yes Pleas Koch, NP  topiramate (TOPAMAX) 25 MG tablet Take 1 tablet by mouth at  bedtime for migraine prevention. 08/25/18  Yes Pleas Koch, NP  traZODone (DESYREL) 150 MG tablet Take 1 tablet (150 mg total) by mouth at bedtime. 05/30/18  Yes Pleas Koch, NP  Plecanatide (TRULANCE) 3 MG TABS Take 3 mg by mouth daily. Patient not taking: Reported on 10/25/2018 10/04/18   [name], MD    Allergies: Allergies  Allergen Reactions  . Penicillins Shortness Of Breath, Diarrhea and Nausea And Vomiting    Has patient had a PCN reaction causing immediate rash, facial/tongue/throat swelling, SOB or lightheadedness with hypotension: yes Has patient had a PCN reaction causing severe rash involving mucus membranes or skin necrosis: no Has patient had a PCN reaction that required hospitalization no Has patient had a PCN reaction occurring within the last 10 years: about 10 years If all of the above answers are "NO", then may proceed with Cephalosporin use.   . Sulfa Antibiotics Nausea And Vomiting    Other reaction(s): VOMITING    Review of Systems: Review of Systems  Constitutional: Negative for chills and fever.  Respiratory: Negative for shortness of breath.   Cardiovascular: Negative for chest pain.  Gastrointestinal: Positive for abdominal pain and constipation. Negative for nausea and vomiting.    Physical Exam BP 92/64   Pulse 67   Temp 97.9 F (36.6 C) (Temporal)   Ht 4\' 11"  (1.499 m)   Wt 100 lb 12.8 oz (45.7 kg)   SpO2 98%   BMI 20.36 kg/m  CONSTITUTIONAL: No acute distress HEENT:  Normocephalic, atraumatic, extraocular motion intact. RESPIRATORY:  Lungs are clear, and breath sounds are equal bilaterally. Normal respiratory effort without pathologic use of accessory muscles. CARDIOVASCULAR: Heart is regular without murmurs, gallops, or rubs. GI: The abdomen is soft, nondistended, with mild discomfort to palpation on the lower portion of her midline incision.  She has a midline incision extending from the epigastric area down to just below the  umbilicus which is well-healed at the skin level.  She does have about a 3 cm incisional hernia at her umbilicus in the lower portion of the incision which is easily reducible.  NEUROLOGIC:  Motor and sensation is grossly normal.  Cranial nerves are grossly intact. PSYCH:  Alert and oriented to person, place and time. Affect is normal.  Labs/Imaging: Labs from 10/24/2018: Alkaline phosphatase 81, albumin 3.9, AST 33, ALT 29, total bilirubin 0.2.  Vitamin B12 1321.  WBC 6.9, hemoglobin 12.4, hematocrit 38.8, platelet 315.  Assessment and Plan: This is a 53 y.o. female with an incisional hernia status post prior open gastrojejunostomy for gastric outlet obstruction.  -Discussed with the patient that she has an incisional hernia at the lower portion of her incision from her prior surgery.  Discussed with her that we can offer her hernia repair.  Discussed with her that this will be an outpatient procedure likely with mesh placement to help reinforce that tissue repair.  Discussed with her the restrictions postoperatively of no heavy lifting or pushing for 4 weeks.  Discussed with her the risks of bleeding, infection, injury to surrounding structures.  She is willing to  proceed.  We will tentatively schedule her for 9/17 for incisional hernia repair with mesh.  She will contact her husband and discuss timing of surgery with him as well and will call us back if that date does not work so that we can reschedule.  Face-to-face time spent with the patient and care providers was 25 minutes, with more than 50% of the time spent counseling, educating, and coordinating care of the patient.     Melvyn Neth, [facility]

## 2018-10-25 NOTE — Patient Instructions (Addendum)
The patient is aware to call back for any questions or new concerns.  Schedule surgery No heavy lifting or pulling for 4 weeks after surgery Incisional Hernia Hernia, Adult     A hernia is the bulging of an organ or tissue through a weak spot in the muscles of the abdomen (abdominal wall). Hernias develop most often near the belly button (navel) or the area where the leg meets the lower abdomen (groin). Common types of hernias include:  Incisional hernia. This type bulges through a scar from an abdominal surgery.  Umbilical hernia. This type develops near the navel.  Inguinal hernia. This type develops in the groin or scrotum.  Femoral hernia. This type develops under the groin, in the upper thigh area.  Hiatal hernia. This type occurs when part of the stomach slides above the muscle that separates the abdomen from the chest (diaphragm). What are the causes? This condition may be caused by:  Heavy lifting.  Coughing over a long period of time.  Straining to have a bowel movement. Constipation can lead to straining.  An incision made during an abdominal surgery.  A physical problem that is present at birth (congenital defect).  Being overweight or obese.  Smoking.  Excess fluid in the abdomen.  Undescended testicles in males. What are the signs or symptoms? The main symptom is a skin-colored, rounded bulge in the area of the hernia. However, a bulge may not always be present. It may grow bigger or be more visible when you cough or strain (such as when lifting something heavy). A hernia that can be pushed back into the area (is reducible) rarely causes pain. A hernia that cannot be pushed back into the area (is incarcerated) may lose its blood supply (become strangulated). A hernia that is incarcerated may cause:  Pain.  Fever.  Nausea and vomiting.  Swelling.  Constipation. How is this diagnosed? A hernia may be diagnosed based on:  Your symptoms and medical  history.  A physical exam. Your health care provider may ask you to cough or move in certain ways to see if the hernia becomes visible.  Imaging tests, such as: ? X-rays. ? Ultrasound. ? CT scan. How is this treated? A hernia that is small and painless may not need to be treated. A hernia that is large or painful may be treated with surgery. Inguinal hernias may be treated with surgery to prevent incarceration or strangulation. Strangulated hernias are always treated with surgery because a lack of blood supply to the trapped organ or tissue can cause it to die. Surgery to treat a hernia involves pushing the bulge back into place and repairing the weak area of the muscle or abdominal wall. Follow these instructions at home: Activity  Avoid straining.  Do not lift anything that is heavier than 10 lb (4.5 kg), or the limit that you are told, until your health care provider says that it is safe.  When lifting heavy objects, lift with your leg muscles, not your back muscles. Preventing constipation  Take actions to prevent constipation. Constipation leads to straining with bowel movements, which can make a hernia worse or cause a hernia repair to break down. Your health care provider may recommend that you: ? Drink enough fluid to keep your urine pale yellow. ? Eat foods that are high in fiber, such as fresh fruits and vegetables, whole grains, and beans. ? Limit foods that are high in fat and processed sugars, such as fried or sweet foods. ?  Take an over-the-counter or prescription medicine for constipation. General instructions  When coughing, try to cough gently.  You may try to push the hernia back in place by very gently pressing on it while lying down. Do not try to force the bulge back in if it will not push in easily.  If you are overweight, work with your health care provider to lose weight safely.  Do not use any products that contain nicotine or tobacco, such as cigarettes and  e-cigarettes. If you need help quitting, ask your health care provider.  If you are scheduled for hernia repair, watch your hernia for any changes in shape, size, or color. Tell your health care provider about any changes or new symptoms.  Take over-the-counter and prescription medicines only as told by your health care provider.  Keep all follow-up visits as told by your health care provider. This is important. Contact a health care provider if:  You develop new pain, swelling, or redness around your hernia.  You have signs of constipation, such as: ? Fewer bowel movements in a week than normal. ? Difficulty having a bowel movement. ? Stools that are dry, hard, or larger than normal. Get help right away if:  You have a fever.  You have abdomen pain that gets worse.  You feel nauseous or you vomit.  You cannot push the hernia back in place by very gently pressing on it while lying down. Do not try to force the bulge back in if it will not push in easily.  The hernia: ? Changes in shape, size, or color. ? Feels hard or tender. These symptoms may represent a serious problem that is an emergency. Do not wait to see if the symptoms will go away. Get medical help right away. Call your local emergency services (911 in the [place] Summary  A hernia is the bulging of an organ or tissue through a weak spot in the muscles of the abdomen (abdominal wall).  The main symptom is a skin-colored, rounded lump (bulge) in the hernia area. However, a bulge may not always be present. It may grow bigger or more visible when you cough or strain (such as when having a bowel movement).  A hernia that is small and painless may not need to be treated. A hernia that is large or painful may be treated with surgery.  Surgery to treat a hernia involves pushing the bulge back into place and repairing the weak part of the abdomen. This information is not intended to replace advice given to you by your health  care provider. Make sure you discuss any questions you have with your health care provider. Document Released: 02/22/2005 Document Revised: 06/15/2018 Document Reviewed: 11/24/2016 Elsevier Patient Education  2020 [employer].

## 2018-10-25 NOTE — Progress Notes (Signed)
10/25/2018  History of Present Illness: Tanya Nguyen is a 53 y.o. female status post open gastrojejunostomy for gastric outlet obstruction on 01/20/2018.  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  Patient reports that she goes to the bathroom for a bowel movement every 2 to 3 days.  She reports that after eating she feels that her abdomen becomes hard she feels a bulging sensation in the lower portion of the incision.  Denies any associated nausea or vomiting and reports only constipation which is at her baseline.  There is no new worsening pain other than the stable pain at the lower portion of the incision.  She last saw Dr. Allen Norris on 10/18/2018 and after discussing these findings with her, she was found to have an incisional hernia at the lower portion of her wound.  Past Medical History: Past Medical History:  Diagnosis Date  . Acquired pyloric stricture   . Bleeding duodenal ulcer   . Chronic esophagogastric ulcer   . Depression   . Duodenal obstruction   . Gastric outlet obstruction 01/19/2018  . GERD (gastroesophageal reflux disease)   . Hypothyroidism   . Intractable vomiting   . Kidney stones   . Migraine headache    only1-2x/month since starting topamax     Past Surgical History: Past Surgical History:  Procedure Laterality Date  . ABDOMINAL HYSTERECTOMY  2004   partial  . CHOLECYSTECTOMY    . COLONOSCOPY WITH PROPOFOL N/A 02/27/2018   Procedure: COLONOSCOPY WITH PROPOFOL;  Surgeon: [name], MD;  Location: [place];  Service: Endoscopy;  Laterality: N/A;  . ESOPHAGOGASTRODUODENOSCOPY (EGD) WITH PROPOFOL N/A 01/12/2018   Procedure: ESOPHAGOGASTRODUODENOSCOPY (EGD) WITH BIOPSIES;  Surgeon: [name], MD;  Location: [place];  Service: Endoscopy;  Laterality: N/A;  . ESOPHAGOGASTRODUODENOSCOPY (EGD) WITH PROPOFOL N/A 01/25/2018   Procedure: ESOPHAGOGASTRODUODENOSCOPY (EGD) WITH PROPOFOL;  Surgeon: Jonathon Bellows, MD;  Location: [facility]  ENDOSCOPY;  Service: Gastroenterology;  Laterality: N/A;  . ESOPHAGOGASTRODUODENOSCOPY (EGD) WITH PROPOFOL N/A 02/27/2018   Procedure: ESOPHAGOGASTRODUODENOSCOPY (EGD) WITH PROPOFOL;  Surgeon: [name], MD;  Location: [place];  Service: Endoscopy;  Laterality: N/A;  . LAPAROTOMY N/A 01/20/2018   Procedure: EXPLORATORY LAPAROTOMY;  Surgeon: Olean Ree, MD;  Location: [facility];  Service: General;  Laterality: N/A;  . TOOTH EXTRACTION      Home Medications: Prior to Admission medications   Medication Sig Start Date End Date Taking? Authorizing Provider  cetirizine (ZYRTEC) 10 MG tablet TAKE 1 TABLET BY MOUTH EVERY DAY AS NEEDED FOR ALLERGY 07/24/18  Yes Pleas Koch, NP  fluticasone (FLONASE) 50 MCG/ACT nasal spray Place 1 spray into both nostrils 2 (two) times daily. 05/03/18  Yes Pleas Koch, NP  levothyroxine (SYNTHROID, LEVOTHROID) 100 MCG tablet Take 1 tablet by mouth every morning on an empty stomach with water. No food or other medications for 30 minutes. 03/30/18  Yes Pleas Koch, NP  lubiprostone (AMITIZA) 24 MCG capsule Take 1 capsule (24 mcg total) by mouth 2 (two) times daily with a meal. 10/24/18  Yes [name], MD  meloxicam (MOBIC) 7.5 MG tablet Take 1 tablet (7.5 mg total) by mouth daily. Patient taking differently: Take 7.5 mg by mouth as needed.  09/26/18  Yes Edrick Kins, DPM  NONFORMULARY OR COMPOUNDED ITEM See pharmacy note 08/04/17  Yes Edrick Kins, DPM  pantoprazole (PROTONIX) 40 MG tablet Take 1 tablet (40 mg total) by mouth 2 (two) times daily. 03/30/18  Yes [name],  MD  sertraline (ZOLOFT) 50 MG tablet Take 1 tablet (50 mg total) by mouth daily. For anxiety. 05/29/18  Yes Pleas Koch, NP  SUMAtriptan (IMITREX) 25 MG tablet TAKE 1 TABLET EVERY 2 HOURS AS NEEDED FOR MIGRAINE, MAY REPEAT IN 2 HOURS IF HEADACHE PERSIST/RECURS 08/07/18  Yes Pleas Koch, NP  topiramate (TOPAMAX) 25 MG tablet Take 1 tablet by mouth at  bedtime for migraine prevention. 08/25/18  Yes Pleas Koch, NP  traZODone (DESYREL) 150 MG tablet Take 1 tablet (150 mg total) by mouth at bedtime. 05/30/18  Yes Pleas Koch, NP  Plecanatide (TRULANCE) 3 MG TABS Take 3 mg by mouth daily. Patient not taking: Reported on 10/25/2018 10/04/18   [name], MD    Allergies: Allergies  Allergen Reactions  . Penicillins Shortness Of Breath, Diarrhea and Nausea And Vomiting    Has patient had a PCN reaction causing immediate rash, facial/tongue/throat swelling, SOB or lightheadedness with hypotension: yes Has patient had a PCN reaction causing severe rash involving mucus membranes or skin necrosis: no Has patient had a PCN reaction that required hospitalization no Has patient had a PCN reaction occurring within the last 10 years: about 10 years If all of the above answers are "NO", then may proceed with Cephalosporin use.   . Sulfa Antibiotics Nausea And Vomiting    Other reaction(s): VOMITING    Review of Systems: Review of Systems  Constitutional: Negative for chills and fever.  Respiratory: Negative for shortness of breath.   Cardiovascular: Negative for chest pain.  Gastrointestinal: Positive for abdominal pain and constipation. Negative for nausea and vomiting.    Physical Exam BP 92/64   Pulse 67   Temp 97.9 F (36.6 C) (Temporal)   Ht 4\' 11"  (1.499 m)   Wt 100 lb 12.8 oz (45.7 kg)   SpO2 98%   BMI 20.36 kg/m  CONSTITUTIONAL: No acute distress HEENT:  Normocephalic, atraumatic, extraocular motion intact. RESPIRATORY:  Lungs are clear, and breath sounds are equal bilaterally. Normal respiratory effort without pathologic use of accessory muscles. CARDIOVASCULAR: Heart is regular without murmurs, gallops, or rubs. GI: The abdomen is soft, nondistended, with mild discomfort to palpation on the lower portion of her midline incision.  She has a midline incision extending from the epigastric area down to just below the  umbilicus which is well-healed at the skin level.  She does have about a 3 cm incisional hernia at her umbilicus in the lower portion of the incision which is easily reducible.  NEUROLOGIC:  Motor and sensation is grossly normal.  Cranial nerves are grossly intact. PSYCH:  Alert and oriented to person, place and time. Affect is normal.  Labs/Imaging: Labs from 10/24/2018: Alkaline phosphatase 81, albumin 3.9, AST 33, ALT 29, total bilirubin 0.2.  Vitamin B12 1321.  WBC 6.9, hemoglobin 12.4, hematocrit 38.8, platelet 315.  Assessment and Plan: This is a 53 y.o. female with an incisional hernia status post prior open gastrojejunostomy for gastric outlet obstruction.  -Discussed with the patient that she has an incisional hernia at the lower portion of her incision from her prior surgery.  Discussed with her that we can offer her hernia repair.  Discussed with her that this will be an outpatient procedure likely with mesh placement to help reinforce that tissue repair.  Discussed with her the restrictions postoperatively of no heavy lifting or pushing for 4 weeks.  Discussed with her the risks of bleeding, infection, injury to surrounding structures.  She is willing to  proceed.  We will tentatively schedule her for 9/17 for incisional hernia repair with mesh.  She will contact her husband and discuss timing of surgery with him as well and will call us back if that date does not work so that we can reschedule.  Face-to-face time spent with the patient and care providers was 25 minutes, with more than 50% of the time spent counseling, educating, and coordinating care of the patient.     Melvyn Neth, [facility]

## 2018-10-26 ENCOUNTER — Telehealth: Payer: Self-pay | Admitting: *Deleted

## 2018-10-26 ENCOUNTER — Telehealth: Payer: Self-pay | Admitting: Primary Care

## 2018-10-26 NOTE — Telephone Encounter (Signed)
Message left for patient to call the office.   We received medical clearance from Alma Friendly, NP and just wanted to notify the patient of this. She has been cleared at low risk and is optimized for surgery.   Note routed to Dr. Hampton Abbot.

## 2018-10-26 NOTE — Telephone Encounter (Signed)
Received a call from [facility] for surgical clearance. Sharyn Lull states that this form was faxed over to Gentry Fitz to sign off on upcoming Incisional Hernia Repair. She states that we can completed the clearance form and fax back to [phone] or we can document within this telephone encounter of clearance and just call them to make them aware clearance is in Epic.   Please advise.

## 2018-10-26 NOTE — Telephone Encounter (Signed)
The form was just faxed on 10/25/2018.  Tanya Nguyen just completed this morning. I have faxed as requested.

## 2018-10-26 NOTE — Telephone Encounter (Signed)
Patient called the office back and was notified per previous message.   The patient verbalizes understanding.  

## 2018-10-26 NOTE — Telephone Encounter (Signed)
Message left on the nurse line at [facility] (Alma Friendly, NP) to see if they received medical clearance request on this patient.   Surgery scheduled for 11-23-18 with Dr. Hampton Abbot.

## 2018-10-31 ENCOUNTER — Other Ambulatory Visit: Payer: PRIVATE HEALTH INSURANCE

## 2018-11-01 ENCOUNTER — Telehealth: Payer: Self-pay | Admitting: Gastroenterology

## 2018-11-01 NOTE — Telephone Encounter (Signed)
Patient called and Has not hear from the insurance company or [place] about Amitiza 24mg . She was approved for the [place] but unable to afford it. Please advise patient.

## 2018-11-02 NOTE — Telephone Encounter (Signed)
Pt is calling again regarding prev. Message please call pt

## 2018-11-03 NOTE — Telephone Encounter (Signed)
Pt notified the Amitiza was approved through covermymeds.  Approved for AMITIZA Capsule 24MCG # 60 per month. Approve 730 capsules for 12 months

## 2018-11-14 ENCOUNTER — Ambulatory Visit
Admission: RE | Admit: 2018-11-14 | Discharge: 2018-11-14 | Disposition: A | Discharge status: Home / Self Care | Payer: PRIVATE HEALTH INSURANCE | Source: Ambulatory Visit | Attending: Primary Care | Admitting: Primary Care

## 2018-11-14 ENCOUNTER — Ambulatory Visit (INDEPENDENT_AMBULATORY_CARE_PROVIDER_SITE_OTHER): Payer: PRIVATE HEALTH INSURANCE | Admitting: Family Medicine

## 2018-11-14 ENCOUNTER — Other Ambulatory Visit: Payer: Self-pay

## 2018-11-14 ENCOUNTER — Encounter: Payer: Self-pay | Admitting: Family Medicine

## 2018-11-14 ENCOUNTER — Ambulatory Visit (INDEPENDENT_AMBULATORY_CARE_PROVIDER_SITE_OTHER)
Admission: RE | Admit: 2018-11-14 | Discharge: 2018-11-14 | Disposition: A | Discharge status: Home / Self Care | Payer: PRIVATE HEALTH INSURANCE | Source: Ambulatory Visit | Attending: Family Medicine | Admitting: Family Medicine

## 2018-11-14 VITALS — BP 108/64 | HR 88 | Temp 98.5°F | Ht 59.0 in | Wt 106.0 lb

## 2018-11-14 DIAGNOSIS — Z23 Encounter for immunization: Secondary | ICD-10-CM

## 2018-11-14 DIAGNOSIS — Z1231 Encounter for screening mammogram for malignant neoplasm of breast: Secondary | ICD-10-CM | POA: Diagnosis not present

## 2018-11-14 DIAGNOSIS — M79602 Pain in left arm: Secondary | ICD-10-CM

## 2018-11-14 DIAGNOSIS — L659 Nonscarring hair loss, unspecified: Secondary | ICD-10-CM | POA: Diagnosis not present

## 2018-11-14 MED ORDER — PREDNISONE 20 MG PO TABS: ORAL_TABLET | ORAL | 0 refills | Status: DC

## 2018-11-14 NOTE — Progress Notes (Signed)
L shoulder pain.  Started about 1 month ago.  Worse recently.  Pain on ROM.  "I think I pulled something.  Pain down the L arm, down to the fingertips.  No R sided pain.  No neck pain.  Pain from L shoulder/axilla distally.    No rash.  No FCNAVD. No leg sx on either side.  R handed.  Still with intact B grip.    She has hernia surgery pending.    She has hair loss.  Started after taking gabapentin.  She is off med currently.  She wanted to talk to dermatology about this.  She has a wig currently.    nad ncat Neck supple, no LA, normal ROM w/o inducing sx in the arms B Normal sensation R arm and B legs.  Dec sensation to light tough L arm.  Normal strength ext x4 L wrist tinel neg.  Normal L shoulder ROM, no pain on int/ext rotation.  rrr ctab Wearing a wig.

## 2018-11-14 NOTE — Patient Instructions (Signed)
Go to the lab on the way out.  We'll contact you with your xray report. Start prednisone with food.  We'll call about a dermatology appointment.  Update [place] as needed.  Take care.  Glad to see you.

## 2018-11-15 DIAGNOSIS — M79602 Pain in left arm: Secondary | ICD-10-CM | POA: Insufficient documentation

## 2018-11-15 NOTE — Assessment & Plan Note (Signed)
She does not have inducible symptoms with Tinel testing of the left wrist.  She does not have inducible symptoms with neck range of motion.  Normal shoulder range of motion.  Normal grip.  Reasonable to check neck x-ray, see notes on plain films.  She is intolerant to gabapentin.  Discussed short course of prednisone taper to see if that would help alleviate her symptoms.  She agreed.  Routine steroid cautions given, especially to avoid NSAIDs with use.  Update me as needed.  She agrees.  Okay for outpatient follow-up.  She does not have left leg symptoms suggestive of CVA.  No motor loss.

## 2018-11-15 NOTE — Assessment & Plan Note (Signed)
Refer to dermatology per patient request.

## 2018-11-16 ENCOUNTER — Telehealth: Payer: Self-pay | Admitting: Primary Care

## 2018-11-16 ENCOUNTER — Encounter
Admission: RE | Admit: 2018-11-16 | Discharge: 2018-11-16 | Disposition: A | Discharge status: Home / Self Care | Payer: PRIVATE HEALTH INSURANCE | Source: Ambulatory Visit | Attending: Surgery | Admitting: Surgery

## 2018-11-16 ENCOUNTER — Other Ambulatory Visit: Payer: Self-pay

## 2018-11-16 HISTORY — DX: Personal history of peptic ulcer disease: Z87.11

## 2018-11-16 HISTORY — DX: Migraine, unspecified, not intractable, without status migrainosus: G43.909

## 2018-11-16 NOTE — Patient Instructions (Signed)
Your procedure is scheduled on: 11/23/2018 Thurs Report to Same Day Surgery 2nd floor medical mall [facility] Entrance-take elevator on left to 2nd floor.  Check in with surgery information desk.) To find out your arrival time please call [phone] between 1PM - 3PM on 11/22/2018 Wed  Remember: Instructions that are not followed completely may result in serious medical risk, up to and including death, or upon the discretion of your surgeon and anesthesiologist your surgery may need to be rescheduled.    _x___ 1. Do not eat food after midnight the night before your procedure. You may drink clear liquids up to 2 hours before you are scheduled to arrive at the hospital for your procedure.  Do not drink clear liquids within 2 hours of your scheduled arrival to the hospital.  Clear liquids include  --Water or Apple juice without pulp  --Clear carbohydrate beverage such as ClearFast or Gatorade  --Black Coffee or Clear Tea (No milk, no creamers, do not add anything to                  the coffee or Tea Type 1 and type 2 diabetics should only drink water.   ____Ensure clear carbohydrate drink on the way to the hospital for bariatric patients  ____Ensure clear carbohydrate drink 3 hours before surgery.   No gum chewing or hard candies.     __x__ 2. No Alcohol for 24 hours before or after surgery.   __x__3. No Smoking or e-cigarettes for 24 prior to surgery.  Do not use any chewable tobacco products for at least 6 hour prior to surgery   ____  4. Bring all medications with you on the day of surgery if instructed.    __x__ 5. Notify your doctor if there is any change in your medical condition     (cold, fever, infections).    x___6. On the morning of surgery brush your teeth with toothpaste and water.  You may rinse your mouth with mouth wash if you wish.  Do not swallow any toothpaste or mouthwash.   Do not wear jewelry, make-up, hairpins, clips or nail polish.  Do not wear lotions,  powders, or perfumes. You may wear deodorant.  Do not shave 48 hours prior to surgery. Men may shave face and neck.  Do not bring valuables to the hospital.    [facility] is not responsible for any belongings or valuables.               Contacts, dentures or bridgework may not be worn into surgery.  Leave your suitcase in the car. After surgery it may be brought to your room.  For patients admitted to the hospital, discharge time is determined by your                       treatment team.  _  Patients discharged the day of surgery will not be allowed to drive home.  You will need someone to drive you home and stay with you the night of your procedure.    Please read over the following fact sheets that you were given:   [facility] Preparing for Surgery and or MRSA Information   _x___ Take anti-hypertensive listed below, cardiac, seizure, asthma,     anti-reflux and psychiatric medicines. These include:  1. levothyroxine (SYNTHROID, LEVOTHROID) 100 MCG tablet  2.pantoprazole (PROTONIX) 40 MG tablet  3.sertraline (ZOLOFT) 50 MG tablet  4.  5.  6.  ____Fleets  enema or Magnesium Citrate as directed.   _x___ Use CHG Soap or sage wipes as directed on instruction sheet   ____ Use inhalers on the day of surgery and bring to hospital day of surgery  ____ Stop Metformin and Janumet 2 days prior to surgery.    ____ Take 1/2 of usual insulin dose the night before surgery and none on the morning     surgery.   _x___ Follow recommendations from Cardiologist, Pulmonologist or PCP regarding          stopping Aspirin, Coumadin, Plavix ,Eliquis, Effient, or Pradaxa, and Pletal.  X____Stop Anti-inflammatories such as Advil, Aleve, Ibuprofen, Motrin, Naproxen, Naprosyn, Goodies powders or aspirin products. OK to take Tylenol and                          Celebrex.   _x___ Stop supplements until after surgery.  But may continue Vitamin D, Vitamin B,       and multivitamin.   ____ Bring C-Pap to  the hospital.

## 2018-11-16 NOTE — Telephone Encounter (Signed)
Patient stated she is waiting on results from an xray she had done when she was in the office on 9/8 with Dr Damita Dunnings.  She would like to know if these have come back yet.

## 2018-11-17 NOTE — Telephone Encounter (Signed)
Patient advised yesterday of results.

## 2018-11-19 ENCOUNTER — Other Ambulatory Visit: Payer: Self-pay | Admitting: Primary Care

## 2018-11-19 DIAGNOSIS — G4709 Other insomnia: Secondary | ICD-10-CM

## 2018-11-19 DIAGNOSIS — F3130 Bipolar disorder, current episode depressed, mild or moderate severity, unspecified: Secondary | ICD-10-CM

## 2018-11-20 ENCOUNTER — Other Ambulatory Visit
Admission: RE | Admit: 2018-11-20 | Discharge: 2018-11-20 | Disposition: A | Discharge status: Home / Self Care | Payer: PRIVATE HEALTH INSURANCE | Source: Ambulatory Visit | Attending: Surgery | Admitting: Surgery

## 2018-11-20 ENCOUNTER — Other Ambulatory Visit: Payer: Self-pay

## 2018-11-20 DIAGNOSIS — Z20828 Contact with and (suspected) exposure to other viral communicable diseases: Secondary | ICD-10-CM | POA: Insufficient documentation

## 2018-11-20 DIAGNOSIS — K432 Incisional hernia without obstruction or gangrene: Secondary | ICD-10-CM | POA: Insufficient documentation

## 2018-11-20 DIAGNOSIS — Z01812 Encounter for preprocedural laboratory examination: Secondary | ICD-10-CM | POA: Insufficient documentation

## 2018-11-20 LAB — SARS CORONAVIRUS 2 (TAT 6-24 HRS): SARS Coronavirus 2: NEGATIVE

## 2018-11-21 ENCOUNTER — Telehealth: Payer: Self-pay | Admitting: Primary Care

## 2018-11-21 NOTE — Telephone Encounter (Signed)
Patient is requesting a refill   predniSONE (DELTASONE) 20 MG tablet   She stated this is really helping her pain. She has about 2 tablets left  [facility] [address]

## 2018-11-22 MED ORDER — CLINDAMYCIN PHOSPHATE 900 MG/50ML IV SOLN
900.0000 mg | INTRAVENOUS | Status: AC
  Administered 2018-11-23: 09:00:00 900 mg via INTRAVENOUS

## 2018-11-22 NOTE — Addendum Note (Signed)
Addended by: Pleas Koch on: 11/22/2018 12:40 PM   Modules accepted: Orders

## 2018-11-22 NOTE — Telephone Encounter (Signed)
This was prescribed by Dr Damita Dunnings ans acute appointment on 11/14/2018.

## 2018-11-22 NOTE — Telephone Encounter (Signed)
Please notify patient that we cannot refill the prednisone as it cannot be taken for a long period of time.  We can send her to physical therapy for further evaluation or she can see Dr. Lorelei Pont for potential steroid injection.

## 2018-11-22 NOTE — Telephone Encounter (Signed)
Spoken and notified patient of Kate Clark's comments. Patient verbalized understanding.  

## 2018-11-23 ENCOUNTER — Ambulatory Visit
Admission: RE | Admit: 2018-11-23 | Discharge: 2018-11-23 | Disposition: A | Discharge status: Home / Self Care | Payer: PRIVATE HEALTH INSURANCE | Attending: Surgery | Admitting: Surgery

## 2018-11-23 ENCOUNTER — Ambulatory Visit: Payer: PRIVATE HEALTH INSURANCE | Admitting: Anesthesiology

## 2018-11-23 ENCOUNTER — Encounter
Admission: RE | Disposition: A | Discharge status: Home / Self Care | Payer: Self-pay | Source: Home / Self Care | Attending: Surgery

## 2018-11-23 ENCOUNTER — Other Ambulatory Visit: Payer: Self-pay

## 2018-11-23 DIAGNOSIS — K581 Irritable bowel syndrome with constipation: Secondary | ICD-10-CM | POA: Diagnosis not present

## 2018-11-23 DIAGNOSIS — K432 Incisional hernia without obstruction or gangrene: Secondary | ICD-10-CM | POA: Diagnosis not present

## 2018-11-23 DIAGNOSIS — Z79890 Hormone replacement therapy (postmenopausal): Secondary | ICD-10-CM | POA: Insufficient documentation

## 2018-11-23 DIAGNOSIS — Z882 Allergy status to sulfonamides status: Secondary | ICD-10-CM | POA: Insufficient documentation

## 2018-11-23 DIAGNOSIS — K219 Gastro-esophageal reflux disease without esophagitis: Secondary | ICD-10-CM | POA: Insufficient documentation

## 2018-11-23 DIAGNOSIS — F419 Anxiety disorder, unspecified: Secondary | ICD-10-CM | POA: Diagnosis not present

## 2018-11-23 DIAGNOSIS — E039 Hypothyroidism, unspecified: Secondary | ICD-10-CM | POA: Insufficient documentation

## 2018-11-23 DIAGNOSIS — Z980 Intestinal bypass and anastomosis status: Secondary | ICD-10-CM | POA: Diagnosis not present

## 2018-11-23 DIAGNOSIS — G43909 Migraine, unspecified, not intractable, without status migrainosus: Secondary | ICD-10-CM | POA: Insufficient documentation

## 2018-11-23 DIAGNOSIS — Z79899 Other long term (current) drug therapy: Secondary | ICD-10-CM | POA: Insufficient documentation

## 2018-11-23 DIAGNOSIS — Z880 Allergy status to penicillin: Secondary | ICD-10-CM | POA: Diagnosis not present

## 2018-11-23 DIAGNOSIS — F319 Bipolar disorder, unspecified: Secondary | ICD-10-CM | POA: Diagnosis not present

## 2018-11-23 HISTORY — PX: INCISIONAL HERNIA REPAIR: SHX193

## 2018-11-23 HISTORY — PX: INSERTION OF MESH: SHX5868

## 2018-11-23 SURGERY — REPAIR, HERNIA, INCISIONAL
Anesthesia: General

## 2018-11-23 MED ORDER — BUPIVACAINE LIPOSOME 1.3 % IJ SUSP: 20.0000 mL | Freq: Once | INTRAMUSCULAR | Status: DC

## 2018-11-23 MED ORDER — MIDAZOLAM HCL 2 MG/2ML IJ SOLN
INTRAMUSCULAR | Status: AC
  Filled 2018-11-23: qty 2

## 2018-11-23 MED ORDER — ACETAMINOPHEN 500 MG PO TABS
ORAL_TABLET | ORAL | Status: AC
  Filled 2018-11-23: qty 2

## 2018-11-23 MED ORDER — DEXAMETHASONE SODIUM PHOSPHATE 10 MG/ML IJ SOLN
INTRAMUSCULAR | Status: AC
  Filled 2018-11-23: qty 1

## 2018-11-23 MED ORDER — CHLORHEXIDINE GLUCONATE CLOTH 2 % EX PADS: 6.0000 | MEDICATED_PAD | Freq: Once | CUTANEOUS | Status: DC

## 2018-11-23 MED ORDER — ONDANSETRON HCL 4 MG/2ML IJ SOLN
INTRAMUSCULAR | Status: AC
  Filled 2018-11-23: qty 2

## 2018-11-23 MED ORDER — DEXMEDETOMIDINE HCL IN NACL 80 MCG/20ML IV SOLN
INTRAVENOUS | Status: AC
  Filled 2018-11-23: qty 20

## 2018-11-23 MED ORDER — BUPIVACAINE-EPINEPHRINE (PF) 0.25% -1:200000 IJ SOLN
INTRAMUSCULAR | Status: AC
  Filled 2018-11-23: qty 30

## 2018-11-23 MED ORDER — KETOROLAC TROMETHAMINE 30 MG/ML IJ SOLN
INTRAMUSCULAR | Status: AC
  Filled 2018-11-23: qty 1

## 2018-11-23 MED ORDER — MEPERIDINE HCL 50 MG/ML IJ SOLN: 6.2500 mg | INTRAMUSCULAR | Status: DC | PRN

## 2018-11-23 MED ORDER — LIDOCAINE HCL (PF) 2 % IJ SOLN
INTRAMUSCULAR | Status: AC
  Filled 2018-11-23: qty 10

## 2018-11-23 MED ORDER — FENTANYL CITRATE (PF) 100 MCG/2ML IJ SOLN
INTRAMUSCULAR | Status: AC
  Filled 2018-11-23: qty 2

## 2018-11-23 MED ORDER — KETOROLAC TROMETHAMINE 30 MG/ML IJ SOLN
INTRAMUSCULAR | Status: DC | PRN
  Administered 2018-11-23: 30 mg via INTRAVENOUS

## 2018-11-23 MED ORDER — ACETAMINOPHEN 160 MG/5ML PO SOLN
325.0000 mg | ORAL | Status: DC | PRN
  Filled 2018-11-23: qty 20.3

## 2018-11-23 MED ORDER — KETOROLAC TROMETHAMINE 30 MG/ML IJ SOLN: 30.0000 mg | Freq: Once | INTRAMUSCULAR | Status: DC | PRN

## 2018-11-23 MED ORDER — PROPOFOL 10 MG/ML IV BOLUS
INTRAVENOUS | Status: DC | PRN
  Administered 2018-11-23: 130 mg via INTRAVENOUS

## 2018-11-23 MED ORDER — ROCURONIUM BROMIDE 100 MG/10ML IV SOLN
INTRAVENOUS | Status: DC | PRN
  Administered 2018-11-23 (×2): 10 mg via INTRAVENOUS
  Administered 2018-11-23: 30 mg via INTRAVENOUS

## 2018-11-23 MED ORDER — EPHEDRINE SULFATE 50 MG/ML IJ SOLN
INTRAMUSCULAR | Status: AC
  Filled 2018-11-23: qty 1

## 2018-11-23 MED ORDER — FENTANYL CITRATE (PF) 100 MCG/2ML IJ SOLN: 25.0000 ug | INTRAMUSCULAR | Status: DC | PRN

## 2018-11-23 MED ORDER — CLINDAMYCIN PHOSPHATE 900 MG/50ML IV SOLN
INTRAVENOUS | Status: AC
  Filled 2018-11-23: qty 50

## 2018-11-23 MED ORDER — PROMETHAZINE HCL 25 MG/ML IJ SOLN: 6.2500 mg | INTRAMUSCULAR | Status: DC | PRN

## 2018-11-23 MED ORDER — SUGAMMADEX SODIUM 200 MG/2ML IV SOLN
INTRAVENOUS | Status: AC
  Filled 2018-11-23: qty 2

## 2018-11-23 MED ORDER — SODIUM CHLORIDE 0.9 % IV SOLN
INTRAVENOUS | Status: DC | PRN
  Administered 2018-11-23: 10 ug/min via INTRAVENOUS

## 2018-11-23 MED ORDER — DEXAMETHASONE SODIUM PHOSPHATE 10 MG/ML IJ SOLN
INTRAMUSCULAR | Status: DC | PRN
  Administered 2018-11-23: 5 mg via INTRAVENOUS

## 2018-11-23 MED ORDER — ACETAMINOPHEN 325 MG PO TABS: 325.0000 mg | ORAL_TABLET | ORAL | Status: DC | PRN

## 2018-11-23 MED ORDER — HYDROCODONE-ACETAMINOPHEN 7.5-325 MG PO TABS: 1.0000 | ORAL_TABLET | Freq: Once | ORAL | Status: DC | PRN

## 2018-11-23 MED ORDER — BUPIVACAINE-EPINEPHRINE (PF) 0.25% -1:200000 IJ SOLN
INTRAMUSCULAR | Status: DC | PRN
  Administered 2018-11-23: 30 mL

## 2018-11-23 MED ORDER — FENTANYL CITRATE (PF) 100 MCG/2ML IJ SOLN
INTRAMUSCULAR | Status: DC | PRN
  Administered 2018-11-23: 50 ug via INTRAVENOUS
  Administered 2018-11-23: 100 ug via INTRAVENOUS
  Administered 2018-11-23 (×2): 25 ug via INTRAVENOUS

## 2018-11-23 MED ORDER — BUPIVACAINE LIPOSOME 1.3 % IJ SUSP
INTRAMUSCULAR | Status: AC
  Filled 2018-11-23: qty 20

## 2018-11-23 MED ORDER — ACETAMINOPHEN 500 MG PO TABS
1000.0000 mg | ORAL_TABLET | ORAL | Status: AC
  Administered 2018-11-23: 1000 mg via ORAL

## 2018-11-23 MED ORDER — LACTATED RINGERS IV SOLN
INTRAVENOUS | Status: DC
  Administered 2018-11-23 (×2): via INTRAVENOUS

## 2018-11-23 MED ORDER — LIDOCAINE HCL (CARDIAC) PF 100 MG/5ML IV SOSY
PREFILLED_SYRINGE | INTRAVENOUS | Status: DC | PRN
  Administered 2018-11-23: 100 mg via INTRAVENOUS

## 2018-11-23 MED ORDER — ROCURONIUM BROMIDE 50 MG/5ML IV SOLN
INTRAVENOUS | Status: AC
  Filled 2018-11-23: qty 1

## 2018-11-23 MED ORDER — ONDANSETRON HCL 4 MG/2ML IJ SOLN
INTRAMUSCULAR | Status: DC | PRN
  Administered 2018-11-23: 4 mg via INTRAVENOUS

## 2018-11-23 MED ORDER — EPHEDRINE SULFATE 50 MG/ML IJ SOLN
INTRAMUSCULAR | Status: DC | PRN
  Administered 2018-11-23: 5 mg via INTRAVENOUS

## 2018-11-23 MED ORDER — OXYCODONE HCL 5 MG PO TABS: 5.0000 mg | ORAL_TABLET | ORAL | 0 refills | Status: DC | PRN

## 2018-11-23 MED ORDER — DEXMEDETOMIDINE HCL IN NACL 200 MCG/50ML IV SOLN
INTRAVENOUS | Status: DC | PRN
  Administered 2018-11-23: 12 ug via INTRAVENOUS

## 2018-11-23 MED ORDER — PHENYLEPHRINE HCL (PRESSORS) 10 MG/ML IV SOLN
INTRAVENOUS | Status: DC | PRN
  Administered 2018-11-23: 100 ug via INTRAVENOUS
  Administered 2018-11-23: 150 ug via INTRAVENOUS
  Administered 2018-11-23 (×2): 100 ug via INTRAVENOUS
  Administered 2018-11-23 (×3): 150 ug via INTRAVENOUS
  Administered 2018-11-23: 100 ug via INTRAVENOUS

## 2018-11-23 MED ORDER — SUGAMMADEX SODIUM 200 MG/2ML IV SOLN
INTRAVENOUS | Status: DC | PRN
  Administered 2018-11-23: 100 mg via INTRAVENOUS

## 2018-11-23 MED ORDER — MIDAZOLAM HCL 2 MG/2ML IJ SOLN
INTRAMUSCULAR | Status: DC | PRN
  Administered 2018-11-23: 1 mg via INTRAVENOUS

## 2018-11-23 MED ORDER — PROPOFOL 10 MG/ML IV BOLUS
INTRAVENOUS | Status: AC
  Filled 2018-11-23: qty 20

## 2018-11-23 MED ORDER — BUPIVACAINE LIPOSOME 1.3 % IJ SUSP
INTRAMUSCULAR | Status: DC | PRN
  Administered 2018-11-23: 20 mL

## 2018-11-23 MED ORDER — CYCLOBENZAPRINE HCL 5 MG PO TABS: 5.0000 mg | ORAL_TABLET | Freq: Three times a day (TID) | ORAL | 0 refills | Status: DC | PRN

## 2018-11-23 SURGICAL SUPPLY — 42 items
BLADE SURG 15 STRL LF DISP TIS (BLADE) IMPLANT
BLADE SURG 15 STRL SS (BLADE) ×1
BULB RESERV EVAC DRAIN JP 100C (MISCELLANEOUS) ×1 IMPLANT
CANISTER SUCT 1200ML W/VALVE (MISCELLANEOUS) ×2 IMPLANT
CHLORAPREP W/TINT 26 (MISCELLANEOUS) ×2 IMPLANT
COVER WAND RF STERILE (DRAPES) IMPLANT
DERMABOND ADVANCED (GAUZE/BANDAGES/DRESSINGS) ×1
DERMABOND ADVANCED .7 DNX12 (GAUZE/BANDAGES/DRESSINGS) ×1 IMPLANT
DRAIN CHANNEL JP 15F RND 16 (MISCELLANEOUS) ×1 IMPLANT
DRAPE LAPAROTOMY 100X77 ABD (DRAPES) ×2 IMPLANT
DRSG OPSITE POSTOP 4X8 (GAUZE/BANDAGES/DRESSINGS) ×1 IMPLANT
DRSG TEGADERM 4X4.75 (GAUZE/BANDAGES/DRESSINGS) ×1 IMPLANT
ELECT CAUTERY BLADE 6.4 (BLADE) ×2 IMPLANT
ELECT REM PT RETURN 9FT ADLT (ELECTROSURGICAL) ×2
ELECTRODE REM PT RTRN 9FT ADLT (ELECTROSURGICAL) ×1 IMPLANT
GAUZE SPONGE 4X4 12PLY STRL (GAUZE/BANDAGES/DRESSINGS) ×1 IMPLANT
GLOVE PROTEXIS LATEX SZ 7.5 (GLOVE) ×2 IMPLANT
GLOVE SURG LATEX 7.5 PF (GLOVE) ×1 IMPLANT
GLOVE SURG SYN 7.0 (GLOVE) ×2 IMPLANT
GLOVE SURG SYN 7.0 PF PI (GLOVE) ×1 IMPLANT
GOWN STRL REUS W/ TWL LRG LVL3 (GOWN DISPOSABLE) ×2 IMPLANT
GOWN STRL REUS W/TWL LRG LVL3 (GOWN DISPOSABLE) ×2
MESH VENTRALIGHT ST 4X6IN (Mesh General) ×1 IMPLANT
NEEDLE HYPO 22GX1.5 SAFETY (NEEDLE) ×2 IMPLANT
NS IRRIG 500ML POUR BTL (IV SOLUTION) ×2 IMPLANT
PACK BASIN MINOR ARMC (MISCELLANEOUS) ×2 IMPLANT
SPONGE DRAIN TRACH 4X4 STRL 2S (GAUZE/BANDAGES/DRESSINGS) ×1 IMPLANT
SUT ETHIBOND 0 (SUTURE) ×2 IMPLANT
SUT ETHIBOND 0 MO6 C/R (SUTURE) ×4 IMPLANT
SUT ETHILON 3-0 FS-10 30 BLK (SUTURE) ×2
SUT MNCRL AB 4-0 PS2 18 (SUTURE) ×2 IMPLANT
SUT PROLENE 2 0 SH DA (SUTURE) ×2 IMPLANT
SUT SILK 0 (SUTURE)
SUT SILK 0 30XBRD TIE 6 (SUTURE) ×1 IMPLANT
SUT SILK 0 SH 30 (SUTURE) ×2 IMPLANT
SUT VIC AB 0 SH 27 (SUTURE) ×2 IMPLANT
SUT VIC AB 2-0 SH 27 (SUTURE) ×1
SUT VIC AB 2-0 SH 27XBRD (SUTURE) IMPLANT
SUT VIC AB 3-0 SH 27 (SUTURE) ×2
SUT VIC AB 3-0 SH 27X BRD (SUTURE) ×3 IMPLANT
SUTURE EHLN 3-0 FS-10 30 BLK (SUTURE) IMPLANT
SYR 10ML LL (SYRINGE) ×2 IMPLANT

## 2018-11-23 NOTE — OR Nursing (Signed)
Dr. Hampton Abbot in to speak with patient [phone].

## 2018-11-23 NOTE — Anesthesia Procedure Notes (Signed)
Procedure Name: Intubation Date/Time: 11/23/2018 8:53 AM Performed by: Lavone Orn, CRNA Pre-anesthesia Checklist: Patient identified, Emergency Drugs available, Suction available, Patient being monitored and Timeout performed Patient Re-evaluated:Patient Re-evaluated prior to induction Oxygen Delivery Method: Circle system utilized Preoxygenation: Pre-oxygenation with 100% oxygen Induction Type: IV induction Ventilation: Mask ventilation without difficulty Laryngoscope Size: Mac and 3 Grade View: Grade I Tube type: Oral Tube size: 7.0 mm Number of attempts: 1 Airway Equipment and Method: Stylet Placement Confirmation: ETT inserted through vocal cords under direct vision,  positive ETCO2 and breath sounds checked- equal and bilateral Secured at: 19 cm Tube secured with: Tape Dental Injury: Teeth and Oropharynx as per pre-operative assessment

## 2018-11-23 NOTE — Op Note (Signed)
  Procedure Date:  11/23/2018  Pre-operative Diagnosis:  Incisional hernia  Post-operative Diagnosis:  Incisional hernia x 3 defects  Procedure:  Open Incisional Hernia Repair with mesh underlay  Surgeon:  Melvyn Neth, MD  Assistant:  Romie Minus, PA-S; Arvilla Meres, RNFA  Anesthesia:  General endotracheal  Estimated Blood Loss:  20 ml  Specimens:  Hernia sacs  Complications:  None  Indications for Procedure:  This is a 53 y.o. female who presents with an incisional hernia from a prior open gastrojejunostomy in 01/2018.  The options of surgery versus observation were reviewed with the patient and/or family. The risks of bleeding, abscess or infection, recurrence of symptoms, injury to surrounding structures, and chronic pain were all discussed with the patient and was willing to proceed.  Description of Procedure: The patient was correctly identified in the preoperative area and brought into the operating room.  The patient was placed supine with VTE prophylaxis in place.  Appropriate time-outs were performed.  Anesthesia was induced and the patient was intubated.  Appropriate antibiotics were infused.  The abdomen was prepped and draped in a sterile fashion. The patient's hernia defect was palpable at the umbilicus.  A periumbilical incision was created using the patient's prior scar, and dissection was carried down the subcutaneous tissue using electrocautery.  The hernia sac was identified and dissected down to healthy fascia.  The hernia sac was excised.  At this point, I evaluated the fascia intraperitoneally with my fingers, and found that the patient had two further hernia defects, one above and one below the umbilicus.  The incision was extended superiorly and inferiorly.  The two hernia sacs were identified, dissected, and resected.  The fascial edges were cleaned using cautery.  Intra-abdominal adhesions to the anterior abdominal wall were carefully dissected free.   Overall, the total length from the superior edge of the superior defect to the inferior edge of the inferior defect was 10 cm.  50 ml of Exparel solution mixed with 0.5% bupivacaine with epi was infiltrated onto the peritoneum, fascia, and subcutaneous tissue.  A Ventralight ST mesh  Measuring 4 x 6 inches was selected and placed intraperitoneally.  0 Ethibond sutures were used to tack the mesh to the abdominal wall in multiple places all around the mesh.  The mesh was lying flat, without kinks, and covering the entire defects appropriately.  Then the fascia of the three defects was closed using 0 Ethibond sutures, incorporating mesh into each bite.  The umbilical stalk was then reattached to the fascia using 2-0 Vicryl.  A 10 Fr. Blake drain was placed anterior to fascia in subcutaneous space and secured with 3-0 Nylon suture. The wound was then irrigated thoroughly and closed in two layers using 3-0 Vicryl and 4-0 Monocryl.  The incision was cleaned and sealed with DermaBond.  Drain was dressed with 4x4 gauze and Tegaderm.  The patient was emerged from anesthesia and extubated and brought to the recovery room for further management.  The patient tolerated the procedure well and all counts were correct at the end of the case.   Melvyn Neth, MD

## 2018-11-23 NOTE — Transfer of Care (Addendum)
Immediate Anesthesia Transfer of Care Note  Patient: Maxi Lougee  Procedure(s) Performed: HERNIA REPAIR INCISIONAL (N/A ) INSERTION OF MESH (N/A )  Patient Location: PACU  Anesthesia Type:General  Level of Consciousness: drowsy  Airway & Oxygen Therapy: Patient Spontanous Breathing and Patient connected to face mask oxygen  Post-op Assessment: Report given to RN and Post -op Vital signs reviewed and stable  Post vital signs: stable  Last Vitals:  Vitals Value Taken Time  BP 119/63 11/23/18 1201  Temp 37 C 11/23/18 1200  Pulse 105 11/23/18 1206  Resp 18 11/23/18 1206  SpO2 100 % 11/23/18 1206  Vitals shown include unvalidated device data.  Last Pain:  Vitals:   11/23/18 1200  TempSrc:   PainSc: Asleep         Complications: No apparent anesthesia complications

## 2018-11-23 NOTE — Discharge Instructions (Signed)

## 2018-11-23 NOTE — Interval H&P Note (Signed)
History and Physical Interval Note:  11/23/2018 8:40 AM  Tanya Nguyen  has presented today for surgery, with the diagnosis of Incisional hernia.  The various methods of treatment have been discussed with the patient and family. After consideration of risks, benefits and other options for treatment, the patient has consented to  Procedure(s): HERNIA REPAIR INCISIONAL (N/A) INSERTION OF MESH (N/A) as a surgical intervention.  The patient's history has been reviewed, patient examined, no change in status, stable for surgery.  I have reviewed the patient's chart and labs.  Questions were answered to the patient's satisfaction.     Yoshiaki Kreuser

## 2018-11-23 NOTE — Anesthesia Preprocedure Evaluation (Addendum)
Anesthesia Evaluation  Patient identified by MRN, date of birth, ID band Patient awake    Reviewed: Allergy & Precautions, H&P , NPO status , reviewed documented beta blocker date and time   Airway Mallampati: III   Neck ROM: full    Dental  (+) Chipped   Pulmonary    Pulmonary exam normal        Cardiovascular Normal cardiovascular exam     Neuro/Psych  Headaches, PSYCHIATRIC DISORDERS Anxiety Depression Bipolar Disorder    GI/Hepatic PUD, GERD  Medicated and Controlled,  Endo/Other  Hypothyroidism   Renal/GU Renal disease     Musculoskeletal   Abdominal   Peds  Hematology   Anesthesia Other Findings Past Medical History: No date: Acquired pyloric stricture No date: Bleeding duodenal ulcer 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 No date: Depression No date: Duodenal obstruction 01/19/2018: Gastric outlet obstruction No date: GERD (gastroesophageal reflux disease) No date: History of stomach ulcers No date: Hypothyroidism No date: Intractable vomiting No date: Kidney stones No date: Migraine No date: Migraine headache     Comment:  only1-2x/month since starting topamax Past Surgical History: 2004: ABDOMINAL HYSTERECTOMY     Comment:  partial No date: CHOLECYSTECTOMY 02/27/2018: COLONOSCOPY WITH PROPOFOL; N/A     Comment:  Procedure: COLONOSCOPY WITH PROPOFOL;  Surgeon: [name], MD;  Location: [place];  Service:               Endoscopy;  Laterality: N/A; 01/12/2018: ESOPHAGOGASTRODUODENOSCOPY (EGD) WITH PROPOFOL; N/A     Comment:  Procedure: ESOPHAGOGASTRODUODENOSCOPY (EGD) WITH               BIOPSIES;  Surgeon: [name], MD;  Location: [place];  Service: Endoscopy;  Laterality: N/A; 01/25/2018: ESOPHAGOGASTRODUODENOSCOPY (EGD) WITH PROPOFOL; N/A     Comment:  Procedure: ESOPHAGOGASTRODUODENOSCOPY (EGD) WITH               PROPOFOL;  Surgeon:  Jonathon Bellows, MD;  Location: [facility]               ENDOSCOPY;  Service: Gastroenterology;  Laterality: N/A; 02/27/2018: ESOPHAGOGASTRODUODENOSCOPY (EGD) WITH PROPOFOL; N/A     Comment:  Procedure: ESOPHAGOGASTRODUODENOSCOPY (EGD) WITH               PROPOFOL;  Surgeon: [name], MD;  Location: [place];  Service: Endoscopy;  Laterality: N/A; 01/20/2018: LAPAROTOMY; N/A     Comment:  Procedure: EXPLORATORY LAPAROTOMY;  Surgeon: Olean Ree, MD;  Location: [facility];  Service: General;                Laterality: N/A; No date: TOOTH EXTRACTION   Reproductive/Obstetrics                            Anesthesia Physical Anesthesia Plan  ASA: III  Anesthesia Plan: General   Post-op Pain Management:    Induction: Intravenous  PONV Risk Score and Plan: Ondansetron and Treatment may vary due to age or medical condition  Airway Management Planned: Oral ETT and LMA  Additional Equipment:   Intra-op Plan:   Post-operative Plan: Extubation in OR  Informed Consent: I  have reviewed the patients History and Physical, chart, labs and discussed the procedure including the risks, benefits and alternatives for the proposed anesthesia with the patient or authorized representative who has indicated his/her understanding and acceptance.     Dental Advisory Given  Plan Discussed with: CRNA  Anesthesia Plan Comments:         Anesthesia Quick Evaluation

## 2018-11-23 NOTE — Anesthesia Post-op Follow-up Note (Signed)
Anesthesia QCDR form completed.        

## 2018-11-24 ENCOUNTER — Encounter: Payer: Self-pay | Admitting: Surgery

## 2018-11-24 ENCOUNTER — Ambulatory Visit (INDEPENDENT_AMBULATORY_CARE_PROVIDER_SITE_OTHER): Payer: PRIVATE HEALTH INSURANCE | Admitting: Surgery

## 2018-11-24 ENCOUNTER — Telehealth: Payer: Self-pay | Admitting: *Deleted

## 2018-11-24 VITALS — BP 90/59 | HR 106 | Temp 97.7°F | Ht 59.0 in | Wt 108.6 lb

## 2018-11-24 DIAGNOSIS — Z09 Encounter for follow-up examination after completed treatment for conditions other than malignant neoplasm: Secondary | ICD-10-CM

## 2018-11-24 DIAGNOSIS — K432 Incisional hernia without obstruction or gangrene: Secondary | ICD-10-CM

## 2018-11-24 LAB — SURGICAL PATHOLOGY

## 2018-11-24 MED ORDER — HYDROCODONE-ACETAMINOPHEN 5-325 MG PO TABS: 1.0000 | ORAL_TABLET | Freq: Four times a day (QID) | ORAL | 0 refills | Status: DC | PRN

## 2018-11-24 NOTE — Patient Instructions (Signed)
Return as scheduled 

## 2018-11-24 NOTE — Telephone Encounter (Signed)
Telephone Triage Questions   Type of surgery: Incisional Hernia Repair     Date: 11/23/18                                Physician: Dr.Piscoya       Pain ? 10  Patient is currently taking oxycodone 5mg  tablet and wants to know if she can get anything stronger but she is still in a lot of pain. Please call and advise

## 2018-11-24 NOTE — Telephone Encounter (Signed)
Patient is coming in to be seen.

## 2018-11-25 ENCOUNTER — Encounter: Payer: Self-pay | Admitting: Surgery

## 2018-11-25 NOTE — Progress Notes (Signed)
11/24/2018  HPI: Tanya Nguyen is a 53 y.o. female s/p open incisional hernia repair with mesh on 9/17.  She presents today because she's having a lot of pain at home.  Denies any nausea or vomiting, fevers, or chills.  Reports the oxycodone is not helping as much.  The Flexeril did help some.  Vital signs: BP (!) 90/59   Pulse (!) 106   Temp 97.7 F (36.5 C)   Ht 4\' 11"  (1.499 m)   Wt 108 lb 9.6 oz (49.3 kg)   SpO2 92%   BMI 21.93 kg/m    Physical Exam: Constitutional: No acute distress Abdomen:  No acute distress.  Abdomen soft, non-distended, with tenderness to palpation at the midline incision.  No evidence of wound breakdown, erythema, drainage, or recurrent hernia.  Patient's Blake drain has serosanguinous fluid.  Assessment/Plan: This is a 54 y.o. female s/p incisional hernia repair with mesh.  Discussed with patient that likely the Exparel local anesthetic did not last as long and now it's more incisional pain.  I'm not concerned about a complication at this point.    Will give her a trial of Vicodin instead to see if this works for her better.  If the Vicodin works better, then she can discard the Oxycodone.  Otherwise, discard the Vicodin.  The patient understands these instructions.  She can also continue taking the Flexeril up to 3 times a day.  She was only taking it once daily.    Otherwise follow up next week for drain removal.   Melvyn Neth, MD [place]

## 2018-11-28 ENCOUNTER — Ambulatory Visit (INDEPENDENT_AMBULATORY_CARE_PROVIDER_SITE_OTHER): Payer: PRIVATE HEALTH INSURANCE | Admitting: Surgery

## 2018-11-28 ENCOUNTER — Other Ambulatory Visit: Payer: Self-pay

## 2018-11-28 ENCOUNTER — Encounter: Payer: Self-pay | Admitting: Surgery

## 2018-11-28 ENCOUNTER — Other Ambulatory Visit: Payer: Self-pay | Admitting: Primary Care

## 2018-11-28 VITALS — BP 108/68 | HR 72 | Temp 97.7°F | Ht 60.0 in | Wt 110.0 lb

## 2018-11-28 DIAGNOSIS — N3941 Urge incontinence: Secondary | ICD-10-CM

## 2018-11-28 DIAGNOSIS — Z09 Encounter for follow-up examination after completed treatment for conditions other than malignant neoplasm: Secondary | ICD-10-CM

## 2018-11-28 DIAGNOSIS — K432 Incisional hernia without obstruction or gangrene: Secondary | ICD-10-CM

## 2018-11-28 NOTE — Progress Notes (Signed)
11/28/2018  HPI: Tanya Nguyen is a 53 y.o. female s/p incisional hernia repair with mesh on 9/17.  Patient presents for follow up.  Reports her pain is doing better after changing to Vicodin for pain control.  She discarded her Oxycodone as instructed.  No other issues.  Her drain has been low output and decreasing daily.  Vital signs: BP 108/68   Pulse 72   Temp 97.7 F (36.5 C) (Skin)   Ht 5' (1.524 m)   Wt 110 lb (49.9 kg)   SpO2 98%   BMI 21.48 kg/m    Physical Exam: Constitutional: No acute distress Abdomen:  Soft, nondistended, appropriately sore to palpation. Incision is clean, dry, intact without evidence of hernia recurrence.  Blake drain with serosanguinous fluid.  Drain removed at bedside without complications and drain site dressed with dry gauze and tape.  Assessment/Plan: This is a 53 y.o. female s/p incisional hernia repair with mesh.  Blake drain removed without issues. Pain better controlled. No evidence of hernia recurrence Follow up in 3 weeks to check on her progress and healing   Melvyn Neth, [place]

## 2018-11-28 NOTE — Patient Instructions (Signed)
Return in three weeks. May change the dressing

## 2018-11-29 ENCOUNTER — Telehealth: Payer: Self-pay | Admitting: Primary Care

## 2018-11-29 ENCOUNTER — Other Ambulatory Visit: Payer: Self-pay | Admitting: Primary Care

## 2018-11-29 DIAGNOSIS — G43001 Migraine without aura, not intractable, with status migrainosus: Secondary | ICD-10-CM

## 2018-11-29 NOTE — Telephone Encounter (Signed)
Returned patients call and left her a detailed message that her Referral was sent to [facility] at [facility]. Phone# [phone]. She can wait for them to call her or can call directly to schedule with them.

## 2018-11-29 NOTE — Telephone Encounter (Signed)
Pt is following up on referral. She was seen on 11/14/18 by Dr. Damita Dunnings. Please call pt back.  CB [phone]

## 2018-11-30 NOTE — Anesthesia Postprocedure Evaluation (Signed)
Anesthesia Post Note  Patient: Tanya Nguyen  Procedure(s) Performed: HERNIA REPAIR INCISIONAL (N/A ) INSERTION OF MESH (N/A )  Patient location during evaluation: PACU Anesthesia Type: General Level of consciousness: awake and alert Pain management: pain level controlled Vital Signs Assessment: post-procedure vital signs reviewed and stable Respiratory status: spontaneous breathing, nonlabored ventilation and respiratory function stable Cardiovascular status: blood pressure returned to baseline and stable Postop Assessment: no apparent nausea or vomiting Anesthetic complications: no     Last Vitals:  Vitals:   11/23/18 1309 11/23/18 1401  BP: 110/63 112/62  Pulse: 95 (!) 105  Resp: 16 16  Temp:    SpO2: 98% 96%    Last Pain:  Vitals:   11/24/18 0909  TempSrc:   PainSc: 8                  Gleason Ardoin Harvie Heck

## 2018-12-05 ENCOUNTER — Encounter: Payer: Self-pay | Admitting: Surgery

## 2018-12-07 ENCOUNTER — Encounter: Payer: Self-pay | Admitting: Surgery

## 2018-12-11 ENCOUNTER — Telehealth: Payer: Self-pay | Admitting: Surgery

## 2018-12-11 NOTE — Telephone Encounter (Signed)
Patient reports that she has not been taking any pain medications and has no pain in the area. She may drive now.

## 2018-12-11 NOTE — Telephone Encounter (Signed)
Telephone Triage Questions    Type of surgery? Hernia repair incisional insertion of mesh      Date?      11/23/2018                 Physician?     Dr. Hampton Abbot     Patient is calling and is asking when she will be able to drive again after her surgery, she is also asking if she still has any restrictions. Please call patient and advise.

## 2018-12-19 ENCOUNTER — Ambulatory Visit (INDEPENDENT_AMBULATORY_CARE_PROVIDER_SITE_OTHER): Payer: PRIVATE HEALTH INSURANCE | Admitting: Surgery

## 2018-12-19 ENCOUNTER — Encounter: Payer: Self-pay | Admitting: Surgery

## 2018-12-19 ENCOUNTER — Other Ambulatory Visit: Payer: Self-pay

## 2018-12-19 VITALS — BP 114/70 | HR 101 | Temp 97.7°F | Resp 12 | Ht 59.0 in | Wt 100.2 lb

## 2018-12-19 DIAGNOSIS — Z09 Encounter for follow-up examination after completed treatment for conditions other than malignant neoplasm: Secondary | ICD-10-CM

## 2018-12-19 DIAGNOSIS — K432 Incisional hernia without obstruction or gangrene: Secondary | ICD-10-CM

## 2018-12-19 NOTE — Patient Instructions (Signed)

## 2018-12-19 NOTE — Progress Notes (Signed)
12/19/2018  HPI: Tanya Nguyen is a 53 y.o. female s/p incisional hernia repair with mesh on 9/17.  She presents for follow up.  Denies any pain, nausea, or vomiting.  Eating well and trying to gain weight.  Vital signs: BP 114/70   Pulse (!) 101   Temp 97.7 F (36.5 C) (Temporal)   Resp 12   Ht 4\' 11"  (1.499 m)   Wt 100 lb 3.2 oz (45.5 kg)   SpO2 97%   BMI 20.24 kg/m    Physical Exam: Constitutional: No acute distress Abdomen:  Soft, non-distended, non-tender.  Midline incision is well healed, without any evidence of hernia recurrence  Assessment/Plan: This is a 53 y.o. female s/p incisional hernia repair.  Patient is doing well and her incision has healed well. Still has no heavy lifting or pushing restrictions until 10/29 Follow up as needed.   Melvyn Neth, [facility]

## 2018-12-20 ENCOUNTER — Ambulatory Visit: Payer: PRIVATE HEALTH INSURANCE | Admitting: Family Medicine

## 2018-12-20 ENCOUNTER — Telehealth: Payer: Self-pay

## 2018-12-20 NOTE — Telephone Encounter (Signed)
Please notify patient that I reviewed her results and would like to repeat these thyroid tests in 2 weeks.  Continue current dose of levothyroxine at 100 mcg as she has been stable on this dose for years. Please schedule a lab only appointment.

## 2018-12-20 NOTE — Telephone Encounter (Signed)
Pt said has h/a since 12/19/18; pt pain level now is 6. If h/a does not improve on oral med pt will go to [place] for injection. Pt is nauseated but is able to keep food and liquids down. Offered virtual appt but pt wanted to come in for injection but due to covid guidelines would need to be virtual or go to UC.Pt will cb if needed.

## 2018-12-20 NOTE — Telephone Encounter (Signed)
Patient called back stating she needed to see you in regards to lab results. She stated that she went to see Dr Nicole Kindred for her hair loss and she wanted to discuss her results.  Advised patient it would have to be a virtual visit because she had previously called with a migraine and nausea.    Patient would like to wait to schedule virtual until she knows that you have those lab results to go over. Dr Les Pou office was going to send these over to you

## 2018-12-21 NOTE — Telephone Encounter (Signed)
Please thank her for the update. Medications like iron, vitamins, magnesium, calcium have to be separated at least 4 hours from levothyroxine or that will alter her thyroid lab tests.

## 2018-12-21 NOTE — Telephone Encounter (Signed)
Spoken and notified patient of Kate Clark's comments. Patient verbalized understanding.  

## 2018-12-21 NOTE — Telephone Encounter (Signed)
Patient advised of Provider message she stated she understood and will conintue the current dose of Levothyroxine .  Schedule 2 week labs for 10/29  Patient did want you to know that Dr Nicole Kindred has her taking iron medication also

## 2018-12-25 ENCOUNTER — Other Ambulatory Visit: Payer: Self-pay | Admitting: Primary Care

## 2018-12-25 DIAGNOSIS — J309 Allergic rhinitis, unspecified: Secondary | ICD-10-CM

## 2018-12-25 DIAGNOSIS — E038 Other specified hypothyroidism: Secondary | ICD-10-CM

## 2018-12-25 DIAGNOSIS — G43001 Migraine without aura, not intractable, with status migrainosus: Secondary | ICD-10-CM

## 2018-12-25 DIAGNOSIS — N3941 Urge incontinence: Secondary | ICD-10-CM

## 2018-12-25 NOTE — Telephone Encounter (Signed)
Refilled levothyroxine. Denied refill request for sumatriptan, refill too soon.

## 2018-12-25 NOTE — Telephone Encounter (Signed)
Imitrex last filled 11/29/2018... please advise if okay to refill

## 2019-01-04 ENCOUNTER — Other Ambulatory Visit: Payer: PRIVATE HEALTH INSURANCE

## 2019-01-04 ENCOUNTER — Telehealth: Payer: Self-pay | Admitting: Primary Care

## 2019-01-04 DIAGNOSIS — E538 Deficiency of other specified B group vitamins: Secondary | ICD-10-CM

## 2019-01-04 NOTE — Telephone Encounter (Signed)
Noted, labs ordered.

## 2019-01-04 NOTE — Telephone Encounter (Signed)
Patient is requesting her Iron to be checked at her lab appointment next Wednesday

## 2019-01-10 ENCOUNTER — Encounter: Payer: Self-pay | Admitting: Family Medicine

## 2019-01-10 ENCOUNTER — Ambulatory Visit (INDEPENDENT_AMBULATORY_CARE_PROVIDER_SITE_OTHER): Payer: PRIVATE HEALTH INSURANCE | Admitting: Family Medicine

## 2019-01-10 ENCOUNTER — Other Ambulatory Visit: Payer: Self-pay

## 2019-01-10 ENCOUNTER — Other Ambulatory Visit (INDEPENDENT_AMBULATORY_CARE_PROVIDER_SITE_OTHER): Payer: PRIVATE HEALTH INSURANCE

## 2019-01-10 VITALS — BP 120/60 | HR 89 | Temp 98.8°F | Ht 59.0 in | Wt 100.2 lb

## 2019-01-10 DIAGNOSIS — S4492XA Injury of unspecified nerve at shoulder and upper arm level, left arm, initial encounter: Secondary | ICD-10-CM | POA: Diagnosis not present

## 2019-01-10 DIAGNOSIS — E038 Other specified hypothyroidism: Secondary | ICD-10-CM

## 2019-01-10 DIAGNOSIS — E538 Deficiency of other specified B group vitamins: Secondary | ICD-10-CM

## 2019-01-10 LAB — TSH: TSH: 0.69 u[IU]/mL (ref 0.35–4.50)

## 2019-01-10 LAB — CBC
HCT: 39.7 % (ref 36.0–46.0)
Hemoglobin: 12.9 g/dL (ref 12.0–15.0)
MCHC: 32.5 g/dL (ref 30.0–36.0)
MCV: 89.7 fl (ref 78.0–100.0)
Platelets: 327 10*3/uL (ref 150.0–400.0)
RBC: 4.43 Mil/uL (ref 3.87–5.11)
RDW: 14.6 % (ref 11.5–15.5)
WBC: 6 10*3/uL (ref 4.0–10.5)

## 2019-01-10 LAB — VITAMIN B12: Vitamin B-12: 347 pg/mL (ref 211–911)

## 2019-01-10 LAB — IBC + FERRITIN
Ferritin: 8.4 ng/mL — ABNORMAL LOW (ref 10.0–291.0)
Iron: 75 ug/dL (ref 42–145)
Saturation Ratios: 28.8 % (ref 20.0–50.0)
Transferrin: 186 mg/dL — ABNORMAL LOW (ref 212.0–360.0)

## 2019-01-10 LAB — T4, FREE: Free T4: 0.95 ng/dL (ref 0.60–1.60)

## 2019-01-10 MED ORDER — METHYLPREDNISOLONE ACETATE 40 MG/ML IJ SUSP
20.0000 mg | Freq: Once | INTRAMUSCULAR | Status: AC
  Administered 2019-01-10: 20 mg via INTRA_ARTICULAR
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
## 2019-01-29 ENCOUNTER — Telehealth: Payer: Self-pay | Admitting: Primary Care

## 2019-01-29 DIAGNOSIS — G43001 Migraine without aura, not intractable, with status migrainosus: Secondary | ICD-10-CM

## 2019-01-29 MED ORDER — SUMATRIPTAN SUCCINATE 25 MG PO TABS: ORAL_TABLET | ORAL | 0 refills | Status: DC

## 2019-01-29 NOTE — Telephone Encounter (Signed)
Refill as requested. Patient has been notified.

## 2019-01-29 NOTE — Telephone Encounter (Signed)
Patient is requesting a refill   SUMATRIPTAN   She has 1 tablet left.  [facility] [place]

## 2019-02-05 ENCOUNTER — Encounter: Payer: Self-pay | Admitting: *Deleted

## 2019-02-05 ENCOUNTER — Other Ambulatory Visit: Payer: Self-pay

## 2019-02-05 ENCOUNTER — Ambulatory Visit
Admission: EM | Admit: 2019-02-05 | Discharge: 2019-02-05 | Disposition: A | Discharge status: Home / Self Care | Payer: PRIVATE HEALTH INSURANCE | Attending: Emergency Medicine | Admitting: Emergency Medicine

## 2019-02-05 DIAGNOSIS — R110 Nausea: Secondary | ICD-10-CM

## 2019-02-05 DIAGNOSIS — R197 Diarrhea, unspecified: Secondary | ICD-10-CM | POA: Diagnosis not present

## 2019-02-05 DIAGNOSIS — R1032 Left lower quadrant pain: Secondary | ICD-10-CM

## 2019-02-05 MED ORDER — ONDANSETRON HCL 4 MG PO TABS: 4.0000 mg | ORAL_TABLET | Freq: Four times a day (QID) | ORAL | 0 refills | Status: DC

## 2019-02-05 NOTE — ED Triage Notes (Addendum)
Patient reports 15 days of diarrhea after getting food poisoning. Patient has been taking OTC meds, no relief. Reports nausea and abdominal pain, states that stomach has been constantly gurgling. Patient has been drinking a lot of water to help with the output.

## 2019-02-05 NOTE — ED Provider Notes (Signed)
Roderic [facility]    CSN: [id number] Arrival date & time: 02/05/19  1708      History   Chief Complaint Chief Complaint  Patient presents with  . Diarrhea    HPI Tanya Nguyen is a 53 y.o. female.   Patient presents with diarrhea, abdominal pain, and nausea x15 days.  She states her symptoms started after eating fast food which she believes gave her food poisoning.  She denies fever, chills, vomiting, or other symptoms.  She has been treating her symptoms at home with Imodium and increased water intake.  She reports 2 episodes of diarrhea today and states it is improving.  She states the abdominal pain is in her LLQ, 6/10, intermittent, "gurgling", no alleviating or aggravating factors identified.  The history is provided by the patient.    Past Medical History:  Diagnosis Date  . Acquired pyloric stricture   . Bleeding duodenal ulcer   . Chronic esophagogastric ulcer   . Depression   . Duodenal obstruction   . Gastric outlet obstruction 01/19/2018  . GERD (gastroesophageal reflux disease)   . History of stomach ulcers   . Hypothyroidism   . Intractable vomiting   . Kidney stones   . Migraine   . Migraine headache    only1-2x/month since starting topamax    Patient Active Problem List   Diagnosis Date Noted  . Incisional hernia, without obstruction or gangrene   . Left arm pain 11/15/2018  . Hair loss 09/22/2018  . Abdominal distension 08/25/2018  . Sacroiliac joint pain 08/14/2018  . Urge incontinence 08/11/2018  . Side pain 03/30/2018  . Protein-calorie malnutrition, severe 01/20/2018  . Abdominal pain, generalized   . Vitamin D deficiency 07/28/2017  . Vitamin B 12 deficiency 07/28/2017  . Chronic low back pain 07/28/2017  . Plantar fascial fibromatosis of both feet 11/10/2016  . Preventative health care 09/16/2016  . Bipolar I disorder, most recent episode depressed ([place]) 03/01/2015  . PTSD (post-traumatic stress disorder) 02/27/2015  . Dysuria  08/26/2014  . Constipation 04/02/2014  . Fatigue 12/13/2013  . Insomnia 08/30/2013  . Loss of weight 03/22/2013  . GERD (gastroesophageal reflux disease)   . Migraines   . Hypothyroidism     Past Surgical History:  Procedure Laterality Date  . ABDOMINAL HYSTERECTOMY  2004   partial  . CHOLECYSTECTOMY    . COLONOSCOPY WITH PROPOFOL N/A 02/27/2018   Procedure: COLONOSCOPY WITH PROPOFOL;  Surgeon: [name], MD;  Location: [facility];  Service: Endoscopy;  Laterality: N/A;  . ESOPHAGOGASTRODUODENOSCOPY (EGD) WITH PROPOFOL N/A 01/12/2018   Procedure: ESOPHAGOGASTRODUODENOSCOPY (EGD) WITH BIOPSIES;  Surgeon: [name], MD;  Location: [facility];  Service: Endoscopy;  Laterality: N/A;  . ESOPHAGOGASTRODUODENOSCOPY (EGD) WITH PROPOFOL N/A 01/25/2018   Procedure: ESOPHAGOGASTRODUODENOSCOPY (EGD) WITH PROPOFOL;  Surgeon: Jonathon Bellows, MD;  Location: [facility];  Service: Gastroenterology;  Laterality: N/A;  . ESOPHAGOGASTRODUODENOSCOPY (EGD) WITH PROPOFOL N/A 02/27/2018   Procedure: ESOPHAGOGASTRODUODENOSCOPY (EGD) WITH PROPOFOL;  Surgeon: [name], MD;  Location: [place];  Service: Endoscopy;  Laterality: N/A;  . INCISIONAL HERNIA REPAIR N/A 11/23/2018   Procedure: HERNIA REPAIR INCISIONAL;  Surgeon: Olean Ree, MD;  Location: [facility];  Service: General;  Laterality: N/A;  . INSERTION OF MESH N/A 11/23/2018   Procedure: INSERTION OF MESH;  Surgeon: Olean Ree, MD;  Location: [facility];  Service: General;  Laterality: N/A;  . LAPAROTOMY N/A 01/20/2018   Procedure: EXPLORATORY LAPAROTOMY;  Surgeon: Olean Ree, MD;  Location: [facility];  Service: General;  Laterality: N/A;  . TOOTH EXTRACTION      OB History   No obstetric history on file.      Home Medications    Prior to Admission medications   Medication Sig Start Date End Date Taking? Authorizing Provider  [facility] INDOOR/OUTDOOR ALLERGY RLF 10 MG tablet TAKE 1 TABLET BY MOUTH EVERY DAY AS  NEEDED FOR ALLERGY 12/25/18   Pleas Koch, NP  fluticasone (FLONASE) 50 MCG/ACT nasal spray Place 1 spray into both nostrils 2 (two) times daily. 05/03/18   Pleas Koch, NP  levothyroxine (SYNTHROID) 100 MCG tablet TAKE 1 TABLET BY MOUTH EVERY MORNING ON EMPTY STOMACH WITH WATER. NO FOOD OR OTHER MEDS FOR 30 MIN 12/25/18   Pleas Koch, NP  ondansetron (ZOFRAN) 4 MG tablet Take 1 tablet (4 mg total) by mouth every 6 (six) hours. 02/05/19   Sharion Balloon, NP  pantoprazole (PROTONIX) 40 MG tablet Take 40 mg by mouth daily.    [provider]  sertraline (ZOLOFT) 50 MG tablet TAKE 1 TABLET (50 MG TOTAL) BY MOUTH DAILY. FOR ANXIETY. 11/21/18   Pleas Koch, NP  SUMAtriptan (IMITREX) 25 MG tablet May repeat in 2 hours if headache persists or recurs. 01/29/19   Pleas Koch, NP  topiramate (TOPAMAX) 25 MG tablet Take 1 tablet by mouth at bedtime for migraine prevention. 08/25/18   Pleas Koch, NP  traZODone (DESYREL) 150 MG tablet TAKE 1 TABLET (150 MG TOTAL) BY MOUTH AT BEDTIME. 11/21/18   Pleas Koch, NP    Family History Family History  Problem Relation Age of Onset  . Cancer Mother   . Breast cancer Mother 42  . Cancer Father   . Alcohol abuse Sister   . Bipolar disorder Sister   . Alcohol abuse Maternal Grandmother   . Stroke Maternal Grandmother   . Stroke Paternal Grandmother     Social History Social History   Tobacco Use  . Smoking status: Never Smoker  . Smokeless tobacco: Never Used  Substance Use Topics  . Alcohol use: Not Currently    Alcohol/week: 0.0 standard drinks  . Drug use: No     Allergies   Penicillins, Gabapentin, and Sulfa antibiotics   Review of Systems Review of Systems  Constitutional: Negative for chills and fever.  HENT: Negative for ear pain and sore throat.   Eyes: Negative for pain and visual disturbance.  Respiratory: Negative for cough and shortness of breath.   Cardiovascular: Negative for  chest pain and palpitations.  Gastrointestinal: Positive for abdominal pain, diarrhea and nausea. Negative for vomiting.  Genitourinary: Negative for dysuria and hematuria.  Musculoskeletal: Negative for arthralgias and back pain.  Skin: Negative for color change and rash.  Neurological: Negative for seizures and syncope.  All other systems reviewed and are negative.    Physical Exam Triage Vital Signs ED Triage Vitals  Enc Vitals Group     BP      Pulse      Resp      Temp      Temp src      SpO2      Weight      Height      Head Circumference      Peak Flow      Pain Score      Pain Loc      Pain Edu?      Excl. in [place]?    No data found.  Updated Vital Signs BP 107/72 (BP Location: Left Arm)   Pulse 94   Temp 98.8 F (37.1 C) (Oral)   Resp 17   SpO2 96%   Visual Acuity Right Eye Distance:   Left Eye Distance:   Bilateral Distance:    Right Eye Near:   Left Eye Near:    Bilateral Near:     Physical Exam Vitals signs and nursing note reviewed.  Constitutional:      General: She is not in acute distress.    Appearance: She is well-developed. She is not ill-appearing.  HENT:     Head: Normocephalic and atraumatic.     Mouth/Throat:     Mouth: Mucous membranes are moist.     Pharynx: Oropharynx is clear.  Eyes:     Conjunctiva/sclera: Conjunctivae normal.  Neck:     Musculoskeletal: Neck supple.  Cardiovascular:     Rate and Rhythm: Normal rate and regular rhythm.     Heart sounds: No murmur.  Pulmonary:     Effort: Pulmonary effort is normal. No respiratory distress.     Breath sounds: Normal breath sounds.  Abdominal:     General: Bowel sounds are normal.     Palpations: Abdomen is soft.     Tenderness: There is no abdominal tenderness. There is no right CVA tenderness, left CVA tenderness, guarding or rebound.  Skin:    General: Skin is warm and dry.     Findings: No rash.  Neurological:     General: No focal deficit present.     Mental  Status: She is alert and oriented to person, place, and time.      UC Treatments / Results  Labs (all labs ordered are listed, but only abnormal results are displayed) Labs Reviewed  GASTROINTESTINAL PANEL BY PCR, STOOL (REPLACES STOOL CULTURE)    EKG   Radiology No results found.  Procedures Procedures (including critical care time)  Medications Ordered in UC Medications - No data to display  Initial Impression / Assessment and Plan / UC Course  I have reviewed the triage vital signs and the nursing notes.  Pertinent labs & imaging results that were available during my care of the patient were reviewed by me and considered in my medical decision making (see chart for details).   Diarrhea, nausea without vomiting.  Patient was able to provide a stool specimen here in the office and it is formed and normal in appearance.  Treating nausea with Zofran.  Instructed patient to go to the emergency department if she has acute abdominal pain or worsening diarrhea.  Instructed her to follow-up with her PCP in 2 to 3 days if her symptoms continue.  Patient agrees to plan of care.     Final Clinical Impressions(s) / UC Diagnoses   Final diagnoses:  Diarrhea, unspecified type  Nausea without vomiting     Discharge Instructions     Take the prescribed antinausea medicine as directed.    Go to the emergency department if you have acute abdominal pain or worsening diarrhea.      Follow-up with your primary care provider in 2 to 3 days for a recheck if your symptoms continue.        ED Prescriptions    Medication Sig Dispense Auth. Provider   ondansetron (ZOFRAN) 4 MG tablet Take 1 tablet (4 mg total) by mouth every 6 (six) hours. 12 tablet Sharion Balloon, NP     PDMP not reviewed this encounter.   Hall Busing,  Fredderick Phenix, NP 02/05/19 1758

## 2019-02-05 NOTE — ED Notes (Signed)
Patient decided she does not want COVID testing because she doesn't know how much it will cost, told patient if she develops symptoms she can go to one of the testing sites. Spoke with patient regarding foods to help with diarrhea and abdominal pain.

## 2019-02-05 NOTE — Discharge Instructions (Addendum)
Take the prescribed antinausea medicine as directed.    Go to the emergency department if you have acute abdominal pain or worsening diarrhea.      Follow-up with your primary care provider in 2 to 3 days for a recheck if your symptoms continue.

## 2019-02-05 NOTE — ED Notes (Signed)
Stool was soft and formed. Spoke with NP, send out stool sample discontinued.

## 2019-02-09 ENCOUNTER — Ambulatory Visit
Admission: RE | Admit: 2019-02-09 | Discharge: 2019-02-09 | Disposition: A | Discharge status: Home / Self Care | Payer: PRIVATE HEALTH INSURANCE | Source: Ambulatory Visit | Attending: *Deleted | Admitting: *Deleted

## 2019-02-09 ENCOUNTER — Ambulatory Visit
Admission: RE | Admit: 2019-02-09 | Discharge: 2019-02-09 | Disposition: A | Discharge status: Home / Self Care | Payer: PRIVATE HEALTH INSURANCE | Source: Ambulatory Visit | Attending: Family Medicine | Admitting: Family Medicine

## 2019-02-09 ENCOUNTER — Ambulatory Visit
Admission: EM | Admit: 2019-02-09 | Discharge: 2019-02-09 | Disposition: A | Discharge status: Home / Self Care | Payer: PRIVATE HEALTH INSURANCE | Attending: Family Medicine | Admitting: Family Medicine

## 2019-02-09 ENCOUNTER — Encounter: Payer: Self-pay | Admitting: Emergency Medicine

## 2019-02-09 ENCOUNTER — Other Ambulatory Visit: Payer: Self-pay

## 2019-02-09 DIAGNOSIS — R197 Diarrhea, unspecified: Secondary | ICD-10-CM | POA: Diagnosis not present

## 2019-02-09 DIAGNOSIS — R109 Unspecified abdominal pain: Secondary | ICD-10-CM

## 2019-02-09 MED ORDER — DICYCLOMINE HCL 20 MG PO TABS: 20.0000 mg | ORAL_TABLET | Freq: Four times a day (QID) | ORAL | 0 refills | Status: DC

## 2019-02-09 NOTE — ED Triage Notes (Signed)
Patient in office persistent diarrhea

## 2019-02-09 NOTE — ED Provider Notes (Signed)
Roderic Palau    CSN: [id number] Arrival date & time: 02/09/19  1405      History   Chief Complaint Chief Complaint  Patient presents with  . Diarrhea    HPI Tanya Nguyen is a 53 y.o. female.   Pt is a 53 year old female with past medical history of acquired pyloric stricture, bleeding duodenal ulcer, chronic esophagogastric ulcer, duodenal obstruction, gastric outlet obstruction, GERD, hypothyroidism, kidney stone, migraine.  She presents today with diarrhea.  This is been persistent over the past 2 and half weeks.  Reporting she has been going up to 7 or more times daily.  Initially started as soft stool and now is more liquidy and watery.  She is having some generalized abdominal "gurgling" and left lower quadrant discomfort at times.  Denies any history of diverticulosis and had a colonoscopy back in 2019 which did not show any signs of this.  She has had some mild associated nausea but no vomiting.  No fevers but has had some intermittent chills.  No upper respiratory symptoms.  No recent traveling or sick contacts.  She has been taking Imodium every day sometimes 10 times a day.  She is also been taking Pepto-Bismol.  This does not help her symptoms.  Patient reports she has lost 10 pounds over the past 2 and half weeks.  She has been able to eat soups and broths.    ROS per HPI      Past Medical History:  Diagnosis Date  . Acquired pyloric stricture   . Bleeding duodenal ulcer   . Chronic esophagogastric ulcer   . Depression   . Duodenal obstruction   . Gastric outlet obstruction 01/19/2018  . GERD (gastroesophageal reflux disease)   . History of stomach ulcers   . Hypothyroidism   . Intractable vomiting   . Kidney stones   . Migraine   . Migraine headache    only1-2x/month since starting topamax    Patient Active Problem List   Diagnosis Date Noted  . Incisional hernia, without obstruction or gangrene   . Left arm pain 11/15/2018  . Hair loss  09/22/2018  . Abdominal distension 08/25/2018  . Sacroiliac joint pain 08/14/2018  . Urge incontinence 08/11/2018  . Side pain 03/30/2018  . Protein-calorie malnutrition, severe 01/20/2018  . Abdominal pain, generalized   . Vitamin D deficiency 07/28/2017  . Vitamin B 12 deficiency 07/28/2017  . Chronic low back pain 07/28/2017  . Plantar fascial fibromatosis of both feet 11/10/2016  . Preventative health care 09/16/2016  . Bipolar I disorder, most recent episode depressed ([place]) 03/01/2015  . PTSD (post-traumatic stress disorder) 02/27/2015  . Dysuria 08/26/2014  . Constipation 04/02/2014  . Fatigue 12/13/2013  . Insomnia 08/30/2013  . Loss of weight 03/22/2013  . GERD (gastroesophageal reflux disease)   . Migraines   . Hypothyroidism     Past Surgical History:  Procedure Laterality Date  . ABDOMINAL HYSTERECTOMY  2004   partial  . CHOLECYSTECTOMY    . COLONOSCOPY WITH PROPOFOL N/A 02/27/2018   Procedure: COLONOSCOPY WITH PROPOFOL;  Surgeon: [name], MD;  Location: [facility];  Service: Endoscopy;  Laterality: N/A;  . ESOPHAGOGASTRODUODENOSCOPY (EGD) WITH PROPOFOL N/A 01/12/2018   Procedure: ESOPHAGOGASTRODUODENOSCOPY (EGD) WITH BIOPSIES;  Surgeon: [name], MD;  Location: [facility];  Service: Endoscopy;  Laterality: N/A;  . ESOPHAGOGASTRODUODENOSCOPY (EGD) WITH PROPOFOL N/A 01/25/2018   Procedure: ESOPHAGOGASTRODUODENOSCOPY (EGD) WITH PROPOFOL;  Surgeon: Jonathon Bellows, MD;  Location: [facility] ENDOSCOPY;  Service: Gastroenterology;  Laterality: N/A;  . ESOPHAGOGASTRODUODENOSCOPY (EGD) WITH PROPOFOL N/A 02/27/2018   Procedure: ESOPHAGOGASTRODUODENOSCOPY (EGD) WITH PROPOFOL;  Surgeon: [name], MD;  Location: [place];  Service: Endoscopy;  Laterality: N/A;  . INCISIONAL HERNIA REPAIR N/A 11/23/2018   Procedure: HERNIA REPAIR INCISIONAL;  Surgeon: Olean Ree, MD;  Location: [facility];  Service: General;  Laterality: N/A;  . INSERTION OF MESH  N/A 11/23/2018   Procedure: INSERTION OF MESH;  Surgeon: Olean Ree, MD;  Location: [facility];  Service: General;  Laterality: N/A;  . LAPAROTOMY N/A 01/20/2018   Procedure: EXPLORATORY LAPAROTOMY;  Surgeon: Olean Ree, MD;  Location: [facility];  Service: General;  Laterality: N/A;  . TOOTH EXTRACTION      OB History   No obstetric history on file.      Home Medications    Prior to Admission medications   Medication Sig Start Date End Date Taking? Authorizing Provider  [facility] INDOOR/OUTDOOR ALLERGY RLF 10 MG tablet TAKE 1 TABLET BY MOUTH EVERY DAY AS NEEDED FOR ALLERGY 12/25/18   Pleas Koch, NP  dicyclomine (BENTYL) 20 MG tablet Take 1 tablet (20 mg total) by mouth 4 (four) times daily for 7 days. 02/09/19 02/16/19  Loura Halt A, NP  fluticasone (FLONASE) 50 MCG/ACT nasal spray Place 1 spray into both nostrils 2 (two) times daily. 05/03/18   Pleas Koch, NP  levothyroxine (SYNTHROID) 100 MCG tablet TAKE 1 TABLET BY MOUTH EVERY MORNING ON EMPTY STOMACH WITH WATER. NO FOOD OR OTHER MEDS FOR 30 MIN 12/25/18   Pleas Koch, NP  ondansetron (ZOFRAN) 4 MG tablet Take 1 tablet (4 mg total) by mouth every 6 (six) hours. 02/05/19   Sharion Balloon, NP  pantoprazole (PROTONIX) 40 MG tablet Take 40 mg by mouth daily.    [provider]  sertraline (ZOLOFT) 50 MG tablet TAKE 1 TABLET (50 MG TOTAL) BY MOUTH DAILY. FOR ANXIETY. 11/21/18   Pleas Koch, NP  SUMAtriptan (IMITREX) 25 MG tablet May repeat in 2 hours if headache persists or recurs. 01/29/19   Pleas Koch, NP  topiramate (TOPAMAX) 25 MG tablet Take 1 tablet by mouth at bedtime for migraine prevention. 08/25/18   Pleas Koch, NP  traZODone (DESYREL) 150 MG tablet TAKE 1 TABLET (150 MG TOTAL) BY MOUTH AT BEDTIME. 11/21/18   Pleas Koch, NP    Family History Family History  Problem Relation Age of Onset  . Cancer Mother   . Breast cancer Mother 26  . Cancer Father   . Alcohol abuse  Sister   . Bipolar disorder Sister   . Alcohol abuse Maternal Grandmother   . Stroke Maternal Grandmother   . Stroke Paternal Grandmother     Social History Social History   Tobacco Use  . Smoking status: Never Smoker  . Smokeless tobacco: Never Used  Substance Use Topics  . Alcohol use: Not Currently    Alcohol/week: 0.0 standard drinks  . Drug use: No     Allergies   Penicillins, Gabapentin, and Sulfa antibiotics   Review of Systems Review of Systems   Physical Exam Triage Vital Signs ED Triage Vitals  Enc Vitals Group     BP 02/09/19 1413 114/74     Pulse Rate 02/09/19 1413 95     Resp 02/09/19 1413 18     Temp 02/09/19 1413 97.7 F (36.5 C)     Temp Source 02/09/19 1413 Oral     SpO2 02/09/19 1413  95 %     Weight 02/09/19 1414 90 lb (40.8 kg)     Height --      Head Circumference --      Peak Flow --      Pain Score --      Pain Loc --      Pain Edu? --      Excl. in [place]? --    No data found.  Updated Vital Signs BP 114/74 (BP Location: Left Arm)   Pulse 95   Temp 97.7 F (36.5 C) (Oral)   Resp 18   Wt 90 lb (40.8 kg)   SpO2 95%   BMI 18.18 kg/m   Visual Acuity Right Eye Distance:   Left Eye Distance:   Bilateral Distance:    Right Eye Near:   Left Eye Near:    Bilateral Near:     Physical Exam Vitals signs and nursing note reviewed.  Constitutional:      General: She is not in acute distress.    Appearance: Normal appearance. She is not ill-appearing, toxic-appearing or diaphoretic.  HENT:     Head: Normocephalic and atraumatic.  Neck:     Musculoskeletal: Normal range of motion.  Pulmonary:     Effort: Pulmonary effort is normal.  Abdominal:     Palpations: Abdomen is soft.     Tenderness: There is abdominal tenderness.     Comments: Generalized abdominal tenderness.  No guarding or rebound.  Musculoskeletal: Normal range of motion.  Skin:    General: Skin is warm and dry.  Neurological:     Mental Status: She is alert.   Psychiatric:        Mood and Affect: Mood normal.      UC Treatments / Results  Labs (all labs ordered are listed, but only abnormal results are displayed) Labs Reviewed  NOVEL CORONAVIRUS, NAA  GASTROINTESTINAL PANEL BY PCR, STOOL (REPLACES STOOL CULTURE)  CBC WITH DIFFERENTIAL/PLATELET  COMPREHENSIVE METABOLIC PANEL    EKG   Radiology No results found.  Procedures Procedures (including critical care time)  Medications Ordered in UC Medications - No data to display  Initial Impression / Assessment and Plan / UC Course  I have reviewed the triage vital signs and the nursing notes.  Pertinent labs & imaging results that were available during my care of the patient were reviewed by me and considered in my medical decision making (see chart for details).     53 year old female presents today with diarrhea for 2 weeks. Has been using Imodium and Pepto with no relief.  Feels as if the situation is getting worse.  The diarrhea is becoming more watery. She is having generalized abdominal cramping more on the left lower quadrant. Today on exam there is no guarding or rebound Based on symptoms and the prolonged diarrhea for greater than 2 weeks we will go ahead and get stool sample and sent for testing There could be some sort of infectious diarrhea that needs to be treated with antibiotics. Checking CBC and CMP. Sending for KUB to rule out bowel obstruction Prescribed Bentyl to take 4 times a day for symptoms Recommended that if symptoms continue or worsen she will need to go to the ER for a CT scan based on history. Otherwise we will wait on lab work to return and x-ray results.  Final Clinical Impressions(s) / UC Diagnoses   Final diagnoses:  Diarrhea of presumed infectious origin     Discharge Instructions  We will send her stool for testing to ensure there is no bacterial infection that needs to be treated. I am also checking some blood work to check on your  electrolytes Make sure you are drinking plenty of fluids with electrolytes to include Gatorade You can take the Bentyl 4 times a day for abdominal cramping I am also sending you for an x-ray to ensure there is no bowel obstruction Covid swab sent for testing If this continues and all of your lab work and x-ray is normal you will have to follow-up with GI specialist.   ED Prescriptions    Medication Sig Dispense Auth. Provider   dicyclomine (BENTYL) 20 MG tablet Take 1 tablet (20 mg total) by mouth 4 (four) times daily for 7 days. 28 tablet Daliya Parchment A, NP     PDMP not reviewed this encounter.   Loura Halt A, NP 02/09/19 1600

## 2019-02-09 NOTE — Discharge Instructions (Addendum)
We will send her stool for testing to ensure there is no bacterial infection that needs to be treated. I am also checking some blood work to check on your electrolytes Make sure you are drinking plenty of fluids with electrolytes to include Gatorade You can take the Bentyl 4 times a day for abdominal cramping I am also sending you for an x-ray to ensure there is no bowel obstruction Covid swab sent for testing If this continues and all of your lab work and x-ray is normal you will have to follow-up with GI specialist.

## 2019-02-10 LAB — COMPREHENSIVE METABOLIC PANEL
ALT: 15 IU/L (ref 0–32)
AST: 21 IU/L (ref 0–40)
Albumin/Globulin Ratio: 1.4 (ref 1.2–2.2)
Albumin: 3.6 g/dL — ABNORMAL LOW (ref 3.8–4.9)
Alkaline Phosphatase: 147 IU/L — ABNORMAL HIGH (ref 39–117)
BUN/Creatinine Ratio: 23 (ref 9–23)
BUN: 21 mg/dL (ref 6–24)
Bilirubin Total: 0.2 mg/dL (ref 0.0–1.2)
CO2: 21 mmol/L (ref 20–29)
Calcium: 8.5 mg/dL — ABNORMAL LOW (ref 8.7–10.2)
Chloride: 100 mmol/L (ref 96–106)
Creatinine, Ser: 0.92 mg/dL (ref 0.57–1.00)
GFR calc Af Amer: 82 mL/min/{1.73_m2} (ref >59)
GFR calc non Af Amer: 71 mL/min/{1.73_m2} (ref >59)
Globulin, Total: 2.5 g/dL (ref 1.5–4.5)
Glucose: 77 mg/dL (ref 65–99)
Potassium: 4.5 mmol/L (ref 3.5–5.2)
Sodium: 138 mmol/L (ref 134–144)
Total Protein: 6.1 g/dL (ref 6.0–8.5)

## 2019-02-10 LAB — CBC WITH DIFFERENTIAL/PLATELET
Basophils Absolute: 0 10*3/uL (ref 0.0–0.2)
Basos: 0 %
EOS (ABSOLUTE): 0.1 10*3/uL (ref 0.0–0.4)
Eos: 1 %
Hematocrit: 45.8 % (ref 34.0–46.6)
Hemoglobin: 15.4 g/dL (ref 11.1–15.9)
Immature Grans (Abs): 0 10*3/uL (ref 0.0–0.1)
Immature Granulocytes: 0 %
Lymphocytes Absolute: 1.5 10*3/uL (ref 0.7–3.1)
Lymphs: 18 %
MCH: 29 pg (ref 26.6–33.0)
MCHC: 33.6 g/dL (ref 31.5–35.7)
MCV: 86 fL (ref 79–97)
Monocytes Absolute: 0.4 10*3/uL (ref 0.1–0.9)
Monocytes: 5 %
Neutrophils Absolute: 6.2 10*3/uL (ref 1.4–7.0)
Neutrophils: 76 %
Platelets: 396 10*3/uL (ref 150–450)
RBC: 5.31 x10E6/uL — ABNORMAL HIGH (ref 3.77–5.28)
RDW: 13 % (ref 11.7–15.4)
WBC: 8.2 10*3/uL (ref 3.4–10.8)

## 2019-02-12 ENCOUNTER — Telehealth: Payer: Self-pay | Admitting: Gastroenterology

## 2019-02-12 ENCOUNTER — Other Ambulatory Visit: Payer: Self-pay

## 2019-02-12 ENCOUNTER — Ambulatory Visit (INDEPENDENT_AMBULATORY_CARE_PROVIDER_SITE_OTHER): Payer: PRIVATE HEALTH INSURANCE | Admitting: Family Medicine

## 2019-02-12 ENCOUNTER — Encounter: Payer: Self-pay | Admitting: Family Medicine

## 2019-02-12 VITALS — Temp 94.3°F | Wt 90.0 lb

## 2019-02-12 DIAGNOSIS — R197 Diarrhea, unspecified: Secondary | ICD-10-CM

## 2019-02-12 NOTE — Progress Notes (Signed)
Virtual Visit via Telephone Note  I connected with Tanya Nguyen on 02/12/19 at 12:00 PM EST by telephone and verified that I am speaking with the correct person using two identifiers.   I discussed the limitations, risks, security and privacy concerns of performing an evaluation and management service by telephone and the availability of in person appointments. I also discussed with the patient that there may be a patient responsible charge related to this service. The patient expressed understanding and agreed to proceed.  Patient location: Home Provider Location: [place] Participants: Lesleigh Noe and [name]   History of Present Illness: Chief Complaint  Patient presents with  . Diarrhea    vomiting, x 2 weeks and 7 days. Went to [facility] urgent care x 2 for this issue.    Diarrhea  This is a new problem. The current episode started 1 to 4 weeks ago. The problem occurs 5 to 10 times per day (initially every 15 minutes). The problem has been gradually improving. The stool consistency is described as watery (greenish). The patient states that diarrhea awakens her from sleep. Associated symptoms include chills, vomiting and weight loss (10lbs). Pertinent negatives include no abdominal pain or fever. Nothing aggravates the symptoms. She has tried anti-motility drug (light food) for the symptoms.   Initially though food poisoning after eating chick-fil-a  Stool study pending   Covid test - was done on Friday - not back yet  Notes dry mouth but drinking plenty of water - feels thirsty all the time Today was the first day she had vomiting  Has zofran and Bentyl to treat  Has a GI doctor, has not reached out to them  Has chronic constipation normally   Review of Systems  Constitutional: Positive for chills and weight loss (10lbs). Negative for fever.  Gastrointestinal: Positive for diarrhea and vomiting. Negative for abdominal pain.       Observations/Objective: Temp (!) 94.3 F (34.6 C) Comment: per patient  Wt 90 lb (40.8 kg) Comment: per patient  BMI 18.18 kg/m   Phone visit:  Patient speaking in complete sentences No distress Alert and oriented Normal mood  Assessment and Plan: Problem List Items Addressed This Visit    None    Visit Diagnoses    Diarrhea of presumed infectious origin    -  Primary     Discussed waiting for results from Urgent care Continue to hydrate - consider Gatorade and or Ensure if low appetite Reach out to GI doctor for appointment next week if symptoms not improved -- as already transitioning into chronic diarrhea picture  Follow Up Instructions:  Return if symptoms worsen or fail to improve.   I discussed the assessment and treatment plan with the patient. The patient was provided an opportunity to ask questions and all were answered. The patient agreed with the plan and demonstrated an understanding of the instructions.   The patient was advised to call back or seek an in-person evaluation if the symptoms worsen or if the condition fails to improve as anticipated.  I provided 18 minutes of non-face-to-face time during this encounter.  Interactive audio and video telecommunications were attempted between this provider and patient, however failed, due to patient having technical difficulties OR patient did not have access to video capability.  We continued and completed visit with audio only.   Start Time: 12:03 End Time: 12:22    Lesleigh Noe, MD

## 2019-02-12 NOTE — Telephone Encounter (Signed)
Patient called stating she has had diarrhea for 3 wks & vomiting once today. She has been treated at the urgent care of [place] .They have sent out a stool sample Friday & she is waiting for the results. Patient is real week.The first thing we have for an appointment is in February.

## 2019-02-12 NOTE — Telephone Encounter (Signed)
Spoke with pt regarding her symptoms of diarrhea. Pt had a virtual appt with her PCP today. GI panel is still pending. Advised pt we will wait for that result as well. Pt was given a rx for the diarrhea and stated he has been working. Pt will call back once results are available.

## 2019-02-13 LAB — NOVEL CORONAVIRUS, NAA: SARS-CoV-2, NAA: NOT DETECTED

## 2019-02-14 ENCOUNTER — Encounter: Payer: Self-pay | Admitting: Internal Medicine

## 2019-02-14 ENCOUNTER — Ambulatory Visit (INDEPENDENT_AMBULATORY_CARE_PROVIDER_SITE_OTHER): Payer: PRIVATE HEALTH INSURANCE | Admitting: Internal Medicine

## 2019-02-14 DIAGNOSIS — R3915 Urgency of urination: Secondary | ICD-10-CM

## 2019-02-14 DIAGNOSIS — R8290 Unspecified abnormal findings in urine: Secondary | ICD-10-CM | POA: Diagnosis not present

## 2019-02-14 DIAGNOSIS — R300 Dysuria: Secondary | ICD-10-CM

## 2019-02-14 LAB — GI PATHOGEN PANEL BY PCR, STOOL

## 2019-02-14 LAB — POC URINALSYSI DIPSTICK (AUTOMATED)
Glucose, UA: NEGATIVE
Nitrite, UA: NEGATIVE
Protein, UA: NEGATIVE
Spec Grav, UA: 1.02 (ref 1.010–1.025)
Urobilinogen, UA: 0.2 E.U./dL
pH, UA: 5.5 (ref 5.0–8.0)

## 2019-02-14 NOTE — Patient Instructions (Signed)

## 2019-02-14 NOTE — Progress Notes (Signed)
Virtual Visit via Telephone Note  I connected with Tanya Nguyen on 02/14/19 at  2:00 PM EST by telephone and verified that I am speaking with the correct person using two identifiers.  Location: Patient: Home Provider: Office   I discussed the limitations, risks, security and privacy concerns of performing an evaluation and management service by telephone and the availability of in person appointments. I also discussed with the patient that there may be a patient responsible charge related to this service. The patient expressed understanding and agreed to proceed.   HPI  Pt reports urinary urgency, urine odor and dysuria. This started 1-2 weeks ago. She denies frequency or blood in her urine. She denies fever, chills, nausea, vomiting or low back pain. She denies vaginal issues. She was recently seen for diarrhea, GI pathogen panel pending. She has not tried anything OTC for this.   Review of Systems  Past Medical History:  Diagnosis Date  . Acquired pyloric stricture   . Bleeding duodenal ulcer   . Chronic esophagogastric ulcer   . Depression   . Duodenal obstruction   . Gastric outlet obstruction 01/19/2018  . GERD (gastroesophageal reflux disease)   . History of stomach ulcers   . Hypothyroidism   . Intractable vomiting   . Kidney stones   . Migraine   . Migraine headache    only1-2x/month since starting topamax    Family History  Problem Relation Age of Onset  . Cancer Mother   . Breast cancer Mother 55  . Cancer Father   . Alcohol abuse Sister   . Bipolar disorder Sister   . Alcohol abuse Maternal Grandmother   . Stroke Maternal Grandmother   . Stroke Paternal Grandmother     Social History   Socioeconomic History  . Marital status: Married    Spouse name: Not on file  . Number of children: Not on file  . Years of education: Not on file  . Highest education level: Not on file  Occupational History  . Not on file  Social Needs  . Financial resource  strain: Not on file  . Food insecurity    Worry: Not on file    Inability: Not on file  . Transportation needs    Medical: Not on file    Non-medical: Not on file  Tobacco Use  . Smoking status: Never Smoker  . Smokeless tobacco: Never Used  Substance and Sexual Activity  . Alcohol use: Not Currently    Alcohol/week: 0.0 standard drinks  . Drug use: No  . Sexual activity: Yes    Birth control/protection: None  Lifestyle  . Physical activity    Days per week: Not on file    Minutes per session: Not on file  . Stress: Not on file  Relationships  . Social Herbalist on phone: Not on file    Gets together: Not on file    Attends religious service: Not on file    Active member of club or organization: Not on file    Attends meetings of clubs or organizations: Not on file    Relationship status: Not on file  . Intimate partner violence    Fear of current or ex partner: Not on file    Emotionally abused: Not on file    Physically abused: Not on file    Forced sexual activity: Not on file  Other Topics Concern  . Not on file  Social History Narrative  . Not on file  Allergies  Allergen Reactions  . Penicillins Shortness Of Breath, Diarrhea and Nausea And Vomiting    Has patient had a PCN reaction causing immediate rash, facial/tongue/throat swelling, SOB or lightheadedness with hypotension: yes Has patient had a PCN reaction causing severe rash involving mucus membranes or skin necrosis: no Has patient had a PCN reaction that required hospitalization no Has patient had a PCN reaction occurring within the last 10 years: about 10 years If all of the above answers are "NO", then may proceed with Cephalosporin use.   . Gabapentin     Hair loss  . Sulfa Antibiotics Nausea And Vomiting    Other reaction(s): VOMITING     Constitutional: Denies fever, malaise, fatigue, headache or abrupt weight changes.   GU: Pt reports urgency, pain with urination and urine odor.  Denies urinary frequency, burning sensation, blood in urine, odor or discharge. Skin: Denies redness, rashes, lesions or ulcercations.   No other specific complaints in a complete review of systems (except as listed in HPI above).    Objective:   Physical Exam   Wt Readings from Last 3 Encounters:  02/12/19 90 lb (40.8 kg)  02/09/19 90 lb (40.8 kg)  01/10/19 100 lb 4 oz (45.5 kg)    Telephone visit, no exam able to be performed.        Assessment & Plan:   Urgency, Dysuria and Urine Odor  Urinalysis: 1+ leuks, neg blood, neg nitrites Will send urine culture OK to take AZO OTC Drink plenty of fluids  RTC as needed or if symptoms persist. Webb Silversmith, NP    Follow Up Instructions:    I discussed the assessment and treatment plan with the patient. The patient was provided an opportunity to ask questions and all were answered. The patient agreed with the plan and demonstrated an understanding of the instructions.   The patient was advised to call back or seek an in-person evaluation if the symptoms worsen or if the condition fails to improve as anticipated.  Total of 5:45 minutes, non face to face time spent on this telephone encounter   Webb Silversmith, NP

## 2019-02-16 ENCOUNTER — Telehealth (HOSPITAL_COMMUNITY): Payer: Self-pay | Admitting: Family Medicine

## 2019-02-16 ENCOUNTER — Telehealth: Payer: Self-pay

## 2019-02-16 LAB — URINE CULTURE
MICRO NUMBER:: 1180639
SPECIMEN QUALITY:: ADEQUATE

## 2019-02-16 MED ORDER — DICYCLOMINE HCL 20 MG PO TABS: 20.0000 mg | ORAL_TABLET | Freq: Four times a day (QID) | ORAL | 0 refills | Status: DC

## 2019-02-16 MED ORDER — ONDANSETRON HCL 4 MG PO TABS: 4.0000 mg | ORAL_TABLET | Freq: Four times a day (QID) | ORAL | 0 refills | Status: DC

## 2019-02-16 MED ORDER — AZITHROMYCIN 250 MG PO TABS: 1000.0000 mg | ORAL_TABLET | Freq: Once | ORAL | 0 refills | Status: AC

## 2019-02-16 NOTE — Telephone Encounter (Signed)
Patient treated by Urgent Care provider today. Agree with azithromycin course.

## 2019-02-16 NOTE — Telephone Encounter (Signed)
Patient contacted the office and stated that she had a GI panel ordered at Urgent Care, and she see's the results in her MyChart and she states something is abnormal and bacteria was detected and she doesn't know what to do. She states she has had diarrhea for over 3 weeks now, and she needs something to help resolve this.   Please advise. Thanks!

## 2019-02-16 NOTE — Telephone Encounter (Signed)
Treating for Ecoli in stool based on length of symptoms.  1 x dose azithromycin.  Refilled the bentyl and Zofran.  Reassured pt and all questions answered.

## 2019-02-17 MED ORDER — NITROFURANTOIN MONOHYD MACRO 100 MG PO CAPS: 100.0000 mg | ORAL_CAPSULE | Freq: Two times a day (BID) | ORAL | 0 refills | Status: DC

## 2019-02-17 NOTE — Addendum Note (Signed)
Addended by: Jearld Fenton on: 02/17/2019 08:45 PM   Modules accepted: Orders

## 2019-02-19 ENCOUNTER — Telehealth: Payer: Self-pay | Admitting: Gastroenterology

## 2019-02-19 NOTE — Telephone Encounter (Signed)
FYI per pt. Diagnosed with E-coli.

## 2019-02-19 NOTE — Telephone Encounter (Signed)
Pt is calling to let Dr. Allen Norris know she does have an infection in her stool and is currently on RX azithromycin 250 mg.

## 2019-02-21 ENCOUNTER — Other Ambulatory Visit: Payer: Self-pay | Admitting: Primary Care

## 2019-02-21 DIAGNOSIS — G43001 Migraine without aura, not intractable, with status migrainosus: Secondary | ICD-10-CM

## 2019-02-22 ENCOUNTER — Telehealth: Payer: Self-pay | Admitting: Primary Care

## 2019-02-22 DIAGNOSIS — N3000 Acute cystitis without hematuria: Secondary | ICD-10-CM

## 2019-02-22 NOTE — Telephone Encounter (Signed)
Patient is calling in regards to the nitrofurantion she was prescribed for UTI  She stated that it is causing major headache and nausea. She said she cant even take it because it makes her sick. She is not sure what she should do.   Spoke with Dr Einar Pheasant to help patient get through the night and patient did not want to take the last does for today and would wait to her back from you tomorrow

## 2019-02-23 MED ORDER — CIPROFLOXACIN HCL 500 MG PO TABS: 500.0000 mg | ORAL_TABLET | Freq: Every day | ORAL | 0 refills | Status: AC

## 2019-02-23 NOTE — Telephone Encounter (Signed)
I have spoken to patient. First, patient is taking not macrobid as instructed due to stomach pain. She took the first dosage BID on Monday 02/19/2019 and the rest of the week, she only took 1 capsule daily except, yesterday she did not take any since she vomited. Patient would like to know what should she do. Try a different antidotic?

## 2019-02-23 NOTE — Telephone Encounter (Signed)
Spoken and notified patient of Tanya Millers comments. Patient stated that she have not taken before. [facility]

## 2019-02-23 NOTE — Telephone Encounter (Signed)
Pt called back wanting someone to call her back.  She doesn't have a headache today and her stomach is doing better also.  She is wanting to know what she needs to do  Are you going to call her in another rx  Pt stated she vomiting after she talked to  The office.    Best number [phone]  [facility] [address] [place]

## 2019-02-23 NOTE — Telephone Encounter (Signed)
Please notify patient that we can do a three day course of Ciprofloxacin but that's really our last option.  Has she taken Cipro before? Any problems?

## 2019-02-23 NOTE — Telephone Encounter (Signed)
Noted, Rx sent to pharmacy. 

## 2019-02-27 ENCOUNTER — Ambulatory Visit: Payer: Self-pay | Admitting: Surgery

## 2019-03-20 ENCOUNTER — Other Ambulatory Visit: Payer: Self-pay | Admitting: Primary Care

## 2019-03-20 DIAGNOSIS — E038 Other specified hypothyroidism: Secondary | ICD-10-CM

## 2019-03-23 ENCOUNTER — Other Ambulatory Visit: Payer: Self-pay | Admitting: Primary Care

## 2019-03-23 DIAGNOSIS — G43001 Migraine without aura, not intractable, with status migrainosus: Secondary | ICD-10-CM

## 2019-03-26 ENCOUNTER — Telehealth: Payer: Self-pay | Admitting: Primary Care

## 2019-03-26 DIAGNOSIS — G43001 Migraine without aura, not intractable, with status migrainosus: Secondary | ICD-10-CM

## 2019-03-26 NOTE — Telephone Encounter (Signed)
Noted. Refill sent as requested.

## 2019-03-26 NOTE — Telephone Encounter (Signed)
Pt called requesting a refill on her sumatriptan [facility] [address]

## 2019-04-12 ENCOUNTER — Ambulatory Visit: Payer: PRIVATE HEALTH INSURANCE | Admitting: Gastroenterology

## 2019-04-18 ENCOUNTER — Other Ambulatory Visit: Payer: Self-pay | Admitting: Primary Care

## 2019-04-18 DIAGNOSIS — G43001 Migraine without aura, not intractable, with status migrainosus: Secondary | ICD-10-CM

## 2019-04-28 ENCOUNTER — Other Ambulatory Visit: Payer: Self-pay | Admitting: Primary Care

## 2019-04-28 DIAGNOSIS — G43701 Chronic migraine without aura, not intractable, with status migrainosus: Secondary | ICD-10-CM

## 2019-05-01 ENCOUNTER — Telehealth: Payer: Self-pay | Admitting: *Deleted

## 2019-05-01 NOTE — Telephone Encounter (Signed)
Patient called stating that she started with diarrhea Sunday and it stopped Monday after taking imodium and Pepto bismol. Patient stated that she has been vomiting today since 3:30 am and has thrown up 7 times. Patient stated that she has not had any food since noon yesterday which was chicken noodle soup. Patient stated that she was having some abdominal pain, but that has eased up since throwing up so much. Patient denies a fever, dryness of mouth or lips. Patient stated that she is urinating as usual.  After speaking with Allie Bossier NP patient was advised to contact her GI doctor and not to take anymore Imodium. Patient stated that she forgot to mention that she has had a migraine for two days and has been taking her medication. Patient stated that the migraine has eased up. Patient stated that she will reach out to her GI doctor.

## 2019-05-02 NOTE — Telephone Encounter (Signed)
Message left for patient to return my call.  

## 2019-05-02 NOTE — Telephone Encounter (Signed)
Noted. Did she get in touch with GI? How's she doing today?

## 2019-05-03 NOTE — Telephone Encounter (Signed)
Spoken to patient earlier today and she stated that she was feeling better yesterday so she didn't call GI. However, today it started back up again so she told me that she will be calling GI for advise and appointment.

## 2019-05-03 NOTE — Telephone Encounter (Signed)
Noted.  We could see her in our office if needed.

## 2019-05-13 ENCOUNTER — Other Ambulatory Visit: Payer: Self-pay | Admitting: Primary Care

## 2019-05-13 DIAGNOSIS — G43001 Migraine without aura, not intractable, with status migrainosus: Secondary | ICD-10-CM

## 2019-05-16 ENCOUNTER — Other Ambulatory Visit: Payer: Self-pay | Admitting: Primary Care

## 2019-05-16 DIAGNOSIS — G4709 Other insomnia: Secondary | ICD-10-CM

## 2019-05-16 DIAGNOSIS — F3130 Bipolar disorder, current episode depressed, mild or moderate severity, unspecified: Secondary | ICD-10-CM

## 2019-05-24 ENCOUNTER — Other Ambulatory Visit: Payer: Self-pay | Admitting: Primary Care

## 2019-05-24 DIAGNOSIS — R7989 Other specified abnormal findings of blood chemistry: Secondary | ICD-10-CM

## 2019-05-24 DIAGNOSIS — E038 Other specified hypothyroidism: Secondary | ICD-10-CM

## 2019-05-24 DIAGNOSIS — E559 Vitamin D deficiency, unspecified: Secondary | ICD-10-CM

## 2019-05-24 DIAGNOSIS — E538 Deficiency of other specified B group vitamins: Secondary | ICD-10-CM

## 2019-06-07 ENCOUNTER — Other Ambulatory Visit: Payer: Self-pay

## 2019-06-08 ENCOUNTER — Other Ambulatory Visit: Payer: Self-pay

## 2019-06-11 ENCOUNTER — Other Ambulatory Visit (INDEPENDENT_AMBULATORY_CARE_PROVIDER_SITE_OTHER): Payer: 59

## 2019-06-11 ENCOUNTER — Other Ambulatory Visit: Payer: Self-pay

## 2019-06-11 ENCOUNTER — Ambulatory Visit: Payer: Self-pay | Admitting: Gastroenterology

## 2019-06-11 DIAGNOSIS — E038 Other specified hypothyroidism: Secondary | ICD-10-CM | POA: Diagnosis not present

## 2019-06-11 DIAGNOSIS — R7989 Other specified abnormal findings of blood chemistry: Secondary | ICD-10-CM

## 2019-06-11 DIAGNOSIS — E538 Deficiency of other specified B group vitamins: Secondary | ICD-10-CM

## 2019-06-11 DIAGNOSIS — E559 Vitamin D deficiency, unspecified: Secondary | ICD-10-CM

## 2019-06-11 LAB — CBC
HCT: 35.4 % — ABNORMAL LOW (ref 36.0–46.0)
Hemoglobin: 11.7 g/dL — ABNORMAL LOW (ref 12.0–15.0)
MCHC: 33.1 g/dL (ref 30.0–36.0)
MCV: 95.4 fl (ref 78.0–100.0)
Platelets: 205 10*3/uL (ref 150.0–400.0)
RBC: 3.71 Mil/uL — ABNORMAL LOW (ref 3.87–5.11)
RDW: 14.1 % (ref 11.5–15.5)
WBC: 3.7 10*3/uL — ABNORMAL LOW (ref 4.0–10.5)

## 2019-06-11 LAB — COMPREHENSIVE METABOLIC PANEL
ALT: 12 U/L (ref 0–35)
AST: 15 U/L (ref 0–37)
Albumin: 3.3 g/dL — ABNORMAL LOW (ref 3.5–5.2)
Alkaline Phosphatase: 73 U/L (ref 39–117)
BUN: 6 mg/dL (ref 6–23)
CO2: 28 mEq/L (ref 19–32)
Calcium: 8.3 mg/dL — ABNORMAL LOW (ref 8.4–10.5)
Chloride: 109 mEq/L (ref 96–112)
Creatinine, Ser: 0.69 mg/dL (ref 0.40–1.20)
GFR: 88.43 mL/min (ref >60.00)
Glucose, Bld: 80 mg/dL (ref 70–99)
Potassium: 4.1 mEq/L (ref 3.5–5.1)
Sodium: 143 mEq/L (ref 135–145)
Total Bilirubin: 0.2 mg/dL (ref 0.2–1.2)
Total Protein: 5.1 g/dL — ABNORMAL LOW (ref 6.0–8.3)

## 2019-06-11 LAB — LIPID PANEL
Cholesterol: 203 mg/dL — ABNORMAL HIGH (ref 0–200)
HDL: 62.8 mg/dL (ref >39.00)
LDL Cholesterol: 121 mg/dL — ABNORMAL HIGH (ref 0–99)
NonHDL: 139.84
Total CHOL/HDL Ratio: 3
Triglycerides: 93 mg/dL (ref 0.0–149.0)
VLDL: 18.6 mg/dL (ref 0.0–40.0)

## 2019-06-11 LAB — VITAMIN D 25 HYDROXY (VIT D DEFICIENCY, FRACTURES): VITD: 27.52 ng/mL — ABNORMAL LOW (ref 30.00–100.00)

## 2019-06-11 LAB — TSH: TSH: 11.66 u[IU]/mL — ABNORMAL HIGH (ref 0.35–4.50)

## 2019-06-11 LAB — VITAMIN B12: Vitamin B-12: 666 pg/mL (ref 211–911)

## 2019-06-14 ENCOUNTER — Other Ambulatory Visit: Payer: Self-pay

## 2019-06-15 ENCOUNTER — Encounter: Payer: Self-pay | Admitting: Primary Care

## 2019-06-15 ENCOUNTER — Ambulatory Visit (INDEPENDENT_AMBULATORY_CARE_PROVIDER_SITE_OTHER): Payer: 59 | Admitting: Primary Care

## 2019-06-15 VITALS — BP 116/70 | HR 82 | Temp 97.0°F | Ht 59.0 in | Wt 105.2 lb

## 2019-06-15 DIAGNOSIS — K5900 Constipation, unspecified: Secondary | ICD-10-CM

## 2019-06-15 DIAGNOSIS — K219 Gastro-esophageal reflux disease without esophagitis: Secondary | ICD-10-CM

## 2019-06-15 DIAGNOSIS — E038 Other specified hypothyroidism: Secondary | ICD-10-CM | POA: Diagnosis not present

## 2019-06-15 DIAGNOSIS — M545 Low back pain, unspecified: Secondary | ICD-10-CM

## 2019-06-15 DIAGNOSIS — E559 Vitamin D deficiency, unspecified: Secondary | ICD-10-CM

## 2019-06-15 DIAGNOSIS — G4700 Insomnia, unspecified: Secondary | ICD-10-CM

## 2019-06-15 DIAGNOSIS — M722 Plantar fascial fibromatosis: Secondary | ICD-10-CM

## 2019-06-15 DIAGNOSIS — G8929 Other chronic pain: Secondary | ICD-10-CM

## 2019-06-15 DIAGNOSIS — N3941 Urge incontinence: Secondary | ICD-10-CM

## 2019-06-15 DIAGNOSIS — G43001 Migraine without aura, not intractable, with status migrainosus: Secondary | ICD-10-CM | POA: Diagnosis not present

## 2019-06-15 DIAGNOSIS — L659 Nonscarring hair loss, unspecified: Secondary | ICD-10-CM

## 2019-06-15 DIAGNOSIS — Z0000 Encounter for general adult medical examination without abnormal findings: Secondary | ICD-10-CM | POA: Diagnosis not present

## 2019-06-15 DIAGNOSIS — E538 Deficiency of other specified B group vitamins: Secondary | ICD-10-CM

## 2019-06-15 DIAGNOSIS — F3130 Bipolar disorder, current episode depressed, mild or moderate severity, unspecified: Secondary | ICD-10-CM

## 2019-06-15 MED ORDER — TOPIRAMATE 50 MG PO TABS: 50.0000 mg | ORAL_TABLET | Freq: Every day | ORAL | 0 refills | Status: DC

## 2019-06-15 NOTE — Assessment & Plan Note (Signed)
Recent B12 level stable. Discussed to start taking at bedtime.

## 2019-06-15 NOTE — Assessment & Plan Note (Signed)
Improved, no concerns today.

## 2019-06-15 NOTE — Assessment & Plan Note (Addendum)
Compliant to Topamax 25 mg for migraine prevention. Migraines improved overall, but is still experiencing three migraines monthly.   Increase Topamax to 50 mg. She will update. Continue PRN sumatriptan.

## 2019-06-15 NOTE — Progress Notes (Signed)
Subjective:    Patient ID: Tanya Nguyen, female    DOB: 1966/01/23, 54 y.o.   MRN: [id number]  HPI  This visit occurred during the SARS-CoV-2 public health emergency.  Safety protocols were in place, including screening questions prior to the visit, additional usage of staff PPE, and extensive cleaning of exam room while observing appropriate contact time as indicated for disinfecting solutions.   Tanya Nguyen is a 54 year old female who presents today for complete physical.  Immunizations: -Tetanus: Completed in 2016 -Influenza: Completed this season  -Shingles: Completed Zostavax in 2016  Diet: She endorses a healthy diet.  Exercise: She is not exercising.   Eye exam: Completed in 2020 Dental exam: Completes annually   Pap Smear: Hysterectomy  Mammogram: Completed in 2020, September Colonoscopy: Completed in 2019, due in 2029  BP Readings from Last 3 Encounters:  06/15/19 116/70  02/09/19 114/74  02/05/19 107/72   The 10-year ASCVD risk score Mikey Bussing DC Jr., et al., 2013) is: 1.3%   Values used to calculate the score:     Age: 67 years     Sex: Female     Is Non-Hispanic African American: No     Diabetic: No     Tobacco smoker: No     Systolic Blood Pressure: 99991111 mmHg     Is BP treated: No     HDL Cholesterol: 62.8 mg/dL     Total Cholesterol: 203 mg/dL   Review of Systems  Constitutional: Negative for unexpected weight change.  HENT: Negative for rhinorrhea.   Respiratory: Negative for cough and shortness of breath.   Cardiovascular: Negative for chest pain.  Gastrointestinal: Negative for constipation and diarrhea.  Genitourinary: Negative for difficulty urinating.  Musculoskeletal: Negative for arthralgias.  Skin: Negative for rash.  Allergic/Immunologic: Negative for environmental allergies.  Neurological: Positive for headaches. Negative for dizziness and numbness.  Psychiatric/Behavioral: The patient is not nervous/anxious.        Past Medical History:   Diagnosis Date  . Acquired pyloric stricture   . Bleeding duodenal ulcer   . Chronic esophagogastric ulcer   . Depression   . Duodenal obstruction   . Gastric outlet obstruction 01/19/2018  . GERD (gastroesophageal reflux disease)   . History of stomach ulcers   . Hypothyroidism   . Incisional hernia, without obstruction or gangrene   . Intractable vomiting   . Kidney stones   . Migraine   . Migraine headache    only1-2x/month since starting topamax  . Side pain 03/30/2018     Social History   Socioeconomic History  . Marital status: Married    Spouse name: Not on file  . Number of children: Not on file  . Years of education: Not on file  . Highest education level: Not on file  Occupational History  . Not on file  Tobacco Use  . Smoking status: Never Smoker  . Smokeless tobacco: Never Used  Substance and Sexual Activity  . Alcohol use: Not Currently    Alcohol/week: 0.0 standard drinks  . Drug use: No  . Sexual activity: Yes    Birth control/protection: None  Other Topics Concern  . Not on file  Social History Narrative  . Not on file   Social Determinants of Health   Financial Resource Strain:   . Difficulty of Paying Living Expenses:   Food Insecurity:   . Worried About Charity fundraiser in the Last Year:   . [place] in the Last  Year:   Transportation Needs:   . Film/video editor (Medical):   Marland Kitchen Lack of Transportation (Non-Medical):   Physical Activity:   . Days of Exercise per Week:   . Minutes of Exercise per Session:   Stress:   . Feeling of Stress :   Social Connections:   . Frequency of Communication with Friends and Family:   . Frequency of Social Gatherings with Friends and Family:   . Attends Religious Services:   . Active Member of Clubs or Organizations:   . Attends Archivist Meetings:   Marland Kitchen Marital Status:   Intimate Partner Violence:   . Fear of Current or Ex-Partner:   . Emotionally Abused:   Marland Kitchen Physically  Abused:   . Sexually Abused:     Past Surgical History:  Procedure Laterality Date  . ABDOMINAL HYSTERECTOMY  2004   partial  . CHOLECYSTECTOMY    . COLONOSCOPY WITH PROPOFOL N/A 02/27/2018   Procedure: COLONOSCOPY WITH PROPOFOL;  Surgeon: [name], MD;  Location: [facility];  Service: Endoscopy;  Laterality: N/A;  . ESOPHAGOGASTRODUODENOSCOPY (EGD) WITH PROPOFOL N/A 01/12/2018   Procedure: ESOPHAGOGASTRODUODENOSCOPY (EGD) WITH BIOPSIES;  Surgeon: [name], MD;  Location: [place];  Service: Endoscopy;  Laterality: N/A;  . ESOPHAGOGASTRODUODENOSCOPY (EGD) WITH PROPOFOL N/A 01/25/2018   Procedure: ESOPHAGOGASTRODUODENOSCOPY (EGD) WITH PROPOFOL;  Surgeon: Jonathon Bellows, MD;  Location: [facility] ENDOSCOPY;  Service: Gastroenterology;  Laterality: N/A;  . ESOPHAGOGASTRODUODENOSCOPY (EGD) WITH PROPOFOL N/A 02/27/2018   Procedure: ESOPHAGOGASTRODUODENOSCOPY (EGD) WITH PROPOFOL;  Surgeon: [name], MD;  Location: [place];  Service: Endoscopy;  Laterality: N/A;  . INCISIONAL HERNIA REPAIR N/A 11/23/2018   Procedure: HERNIA REPAIR INCISIONAL;  Surgeon: Olean Ree, MD;  Location: [facility];  Service: General;  Laterality: N/A;  . INSERTION OF MESH N/A 11/23/2018   Procedure: INSERTION OF MESH;  Surgeon: Olean Ree, MD;  Location: [facility];  Service: General;  Laterality: N/A;  . LAPAROTOMY N/A 01/20/2018   Procedure: EXPLORATORY LAPAROTOMY;  Surgeon: Olean Ree, MD;  Location: [facility];  Service: General;  Laterality: N/A;  . TOOTH EXTRACTION      Family History  Problem Relation Age of Onset  . Cancer Mother   . Breast cancer Mother 35  . Cancer Father   . Alcohol abuse Sister   . Bipolar disorder Sister   . Alcohol abuse Maternal Grandmother   . Stroke Maternal Grandmother   . Stroke Paternal Grandmother     Allergies  Allergen Reactions  . Penicillins Shortness Of Breath, Diarrhea and Nausea And Vomiting    Has patient had a PCN reaction  causing immediate rash, facial/tongue/throat swelling, SOB or lightheadedness with hypotension: yes Has patient had a PCN reaction causing severe rash involving mucus membranes or skin necrosis: no Has patient had a PCN reaction that required hospitalization no Has patient had a PCN reaction occurring within the last 10 years: about 10 years If all of the above answers are "NO", then may proceed with Cephalosporin use.   . Gabapentin     Hair loss  . Sulfa Antibiotics Nausea And Vomiting    Other reaction(s): VOMITING    Current Outpatient Medications on File Prior to Visit  Medication Sig Dispense Refill  . [facility] INDOOR/OUTDOOR ALLERGY RLF 10 MG tablet TAKE 1 TABLET BY MOUTH EVERY DAY AS NEEDED FOR ALLERGY 30 tablet 5  . fluticasone (FLONASE) 50 MCG/ACT nasal spray Place 1 spray into both nostrils 2 (two) times daily. 16 g 0  .  levothyroxine (SYNTHROID) 100 MCG tablet TAKE 1 TABLET BY MOUTH EVERY MORNING ON EMPTY STOMACH WITH WATER. NO FOOD OR OTHER MEDS FOR 30 MIN 90 tablet 1  . pantoprazole (PROTONIX) 40 MG tablet Take 40 mg by mouth daily.    . sertraline (ZOLOFT) 50 MG tablet TAKE 1 TABLET (50 MG TOTAL) BY MOUTH DAILY. FOR ANXIETY. 90 tablet 1  . SUMAtriptan (IMITREX) 25 MG tablet TAKE 1 TABLET BY MOUTH (MAY REPEAT IN 2 HOURS IF HEADACHE PERSISTS OR RECURS.) 9 tablet 0  . traZODone (DESYREL) 150 MG tablet TAKE 1 TABLET (150 MG TOTAL) BY MOUTH AT BEDTIME. 90 tablet 1   No current facility-administered medications on file prior to visit.    BP 116/70   Pulse 82   Temp (!) 97 F (36.1 C) (Temporal)   Ht 4\' 11"  (1.499 m)   Wt 105 lb 4 oz (47.7 kg)   SpO2 98%   BMI 21.26 kg/m    Objective:   Physical Exam  Constitutional: She is oriented to person, place, and time. She appears well-nourished.  HENT:  Right Ear: Tympanic membrane and ear canal normal.  Left Ear: Tympanic membrane and ear canal normal.  Mouth/Throat: Oropharynx is clear and moist.  Eyes: Pupils are equal,  round, and reactive to light. EOM are normal.  Cardiovascular: Normal rate and regular rhythm.  Respiratory: Effort normal and breath sounds normal.  GI: Soft. Bowel sounds are normal. There is no abdominal tenderness.  Musculoskeletal:        General: Normal range of motion.     Cervical back: Neck supple.  Neurological: She is alert and oriented to person, place, and time. No cranial nerve deficit.  Reflex Scores:      Patellar reflexes are 2+ on the right side and 2+ on the left side. Skin: Skin is warm and dry.  Psychiatric: She has a normal mood and affect.           Assessment & Plan:

## 2019-06-15 NOTE — Assessment & Plan Note (Signed)
Recent TSH of 11.  Takes levothyroxine every morning on an empty stomach with vitamins. Discussed this is incorrect and to separate vitamins four hours from levothyroxine. Also discussed correct instructions.   Repeat TSH in 2 months.

## 2019-06-15 NOTE — Assessment & Plan Note (Signed)
Resolved

## 2019-06-15 NOTE — Assessment & Plan Note (Signed)
Overall improved with podiatry evaluation, she is due for another injection and will schedule an appointment.

## 2019-06-15 NOTE — Assessment & Plan Note (Signed)
Immunizations UTD, discussed Shingrix. She will see if her new insurance will cover. Mammogram UTD. Colonoscopy UTD, due in 2029. Encouraged a healthy diet, regular exercise.  Exam today unremarkable. Labs reviewed.

## 2019-06-15 NOTE — Assessment & Plan Note (Signed)
Doing well on sertraline 50 mg, continue same.  

## 2019-06-15 NOTE — Patient Instructions (Addendum)
Start taking calcium 1200 mg and vitamin D 800 IU daily.  Take all of your vitamins at bedtime.  We've increased your Topamax to 50 mg. You can take two of your 25 mg tablets until your bottle is gone. I sent a new prescription for 50 mg to your pharmacy.  Be sure to take your levothyroxine (thyroid medication) every morning on an empty stomach with water only. No food or other medications for 30 minutes. No heartburn medication, iron pills, calcium, vitamin D, or magnesium pills within four hours of taking levothyroxine.   Start exercising. You should be getting 150 minutes of moderate intensity exercise weekly.  It's important to improve your diet by reducing consumption of fast food, fried food, processed snack foods, sugary drinks. Increase consumption of fresh vegetables and fruits, whole grains, water.  Ensure you are drinking 64 ounces of water daily.  Schedule a lab appointment for 2 months to repeat your thyroid test.  It was a pleasure to see you today!   Preventive Care 89-55 Years Old, Female Preventive care refers to visits with your health care provider and lifestyle choices that can promote health and wellness. This includes:  A yearly physical exam. This may also be called an annual well check.  Regular dental visits and eye exams.  Immunizations.  Screening for certain conditions.  Healthy lifestyle choices, such as eating a healthy diet, getting regular exercise, not using drugs or products that contain nicotine and tobacco, and limiting alcohol use. What can I expect for my preventive care visit? Physical exam Your health care provider will check your:  Height and weight. This may be used to calculate body mass index (BMI), which tells if you are at a healthy weight.  Heart rate and blood pressure.  Skin for abnormal spots. Counseling Your health care provider may ask you questions about your:  Alcohol, tobacco, and drug use.  Emotional  well-being.  Home and relationship well-being.  Sexual activity.  Eating habits.  Work and work Statistician.  Method of birth control.  Menstrual cycle.  Pregnancy history. What immunizations do I need?  Influenza (flu) vaccine  This is recommended every year. Tetanus, diphtheria, and pertussis (Tdap) vaccine  You may need a Td booster every 10 years. Varicella (chickenpox) vaccine  You may need this if you have not been vaccinated. Zoster (shingles) vaccine  You may need this after age 69. Measles, mumps, and rubella (MMR) vaccine  You may need at least one dose of MMR if you were born in 1957 or later. You may also need a second dose. Pneumococcal conjugate (PCV13) vaccine  You may need this if you have certain conditions and were not previously vaccinated. Pneumococcal polysaccharide (PPSV23) vaccine  You may need one or two doses if you smoke cigarettes or if you have certain conditions. Meningococcal conjugate (MenACWY) vaccine  You may need this if you have certain conditions. Hepatitis A vaccine  You may need this if you have certain conditions or if you travel or work in places where you may be exposed to hepatitis A. Hepatitis B vaccine  You may need this if you have certain conditions or if you travel or work in places where you may be exposed to hepatitis B. Haemophilus influenzae type b (Hib) vaccine  You may need this if you have certain conditions. Human papillomavirus (HPV) vaccine  If recommended by your health care provider, you may need three doses over 6 months. You may receive vaccines as individual doses or  as more than one vaccine together in one shot (combination vaccines). Talk with your health care provider about the risks and benefits of combination vaccines. What tests do I need? Blood tests  Lipid and cholesterol levels. These may be checked every 5 years, or more frequently if you are over 56 years old.  Hepatitis C  test.  Hepatitis B test. Screening  Lung cancer screening. You may have this screening every year starting at age 71 if you have a 30-pack-year history of smoking and currently smoke or have quit within the past 15 years.  Colorectal cancer screening. All adults should have this screening starting at age 59 and continuing until age 32. Your health care provider may recommend screening at age 17 if you are at increased risk. You will have tests every 1-10 years, depending on your results and the type of screening test.  Diabetes screening. This is done by checking your blood sugar (glucose) after you have not eaten for a while (fasting). You may have this done every 1-3 years.  Mammogram. This may be done every 1-2 years. Talk with your health care provider about when you should start having regular mammograms. This may depend on whether you have a family history of breast cancer.  BRCA-related cancer screening. This may be done if you have a family history of breast, ovarian, tubal, or peritoneal cancers.  Pelvic exam and Pap test. This may be done every 3 years starting at age 66. Starting at age 56, this may be done every 5 years if you have a Pap test in combination with an HPV test. Other tests  Sexually transmitted disease (STD) testing.  Bone density scan. This is done to screen for osteoporosis. You may have this scan if you are at high risk for osteoporosis. Follow these instructions at home: Eating and drinking  Eat a diet that includes fresh fruits and vegetables, whole grains, lean protein, and low-fat dairy.  Take vitamin and mineral supplements as recommended by your health care provider.  Do not drink alcohol if: ? Your health care provider tells you not to drink. ? You are pregnant, may be pregnant, or are planning to become pregnant.  If you drink alcohol: ? Limit how much you have to 0-1 drink a day. ? Be aware of how much alcohol is in your drink. In the [place] one  drink equals one 12 oz bottle of beer (355 mL), one 5 oz glass of wine (148 mL), or one 1 oz glass of hard liquor (44 mL). Lifestyle  Take daily care of your teeth and gums.  Stay active. Exercise for at least 30 minutes on 5 or more days each week.  Do not use any products that contain nicotine or tobacco, such as cigarettes, e-cigarettes, and chewing tobacco. If you need help quitting, ask your health care provider.  If you are sexually active, practice safe sex. Use a condom or other form of birth control (contraception) in order to prevent pregnancy and STIs (sexually transmitted infections).  If told by your health care provider, take low-dose aspirin daily starting at age 53. What's next?  Visit your health care provider once a year for a well check visit.  Ask your health care provider how often you should have your eyes and teeth checked.  Stay up to date on all vaccines. This information is not intended to replace advice given to you by your health care provider. Make sure you discuss any questions you have with your  health care provider. Document Revised: 11/03/2017 Document Reviewed: 11/03/2017 Elsevier Patient Education  2020 [employer].

## 2019-06-15 NOTE — Assessment & Plan Note (Addendum)
Overall doing well on Trazodone 150 mg HS, mostly taking 75 mg. Continue same.

## 2019-06-15 NOTE — Assessment & Plan Note (Signed)
Overall well managed on pantoprazole 40 mg for which she takes HS. Continue same.

## 2019-06-15 NOTE — Assessment & Plan Note (Signed)
Resolved. No more loss.

## 2019-06-15 NOTE — Assessment & Plan Note (Signed)
Vitamin D level low on recent labs, she is not taking vitamin D. Discussed to take calcium with vitamin D daily at bedtime.

## 2019-06-15 NOTE — Assessment & Plan Note (Signed)
Improved. Follows with GI>

## 2019-07-10 ENCOUNTER — Telehealth: Payer: Self-pay | Admitting: Podiatry

## 2019-07-10 ENCOUNTER — Telehealth: Payer: Self-pay | Admitting: Primary Care

## 2019-07-10 ENCOUNTER — Other Ambulatory Visit: Payer: Self-pay

## 2019-07-10 ENCOUNTER — Ambulatory Visit (INDEPENDENT_AMBULATORY_CARE_PROVIDER_SITE_OTHER): Payer: 59 | Admitting: Podiatry

## 2019-07-10 DIAGNOSIS — G609 Hereditary and idiopathic neuropathy, unspecified: Secondary | ICD-10-CM | POA: Diagnosis not present

## 2019-07-10 DIAGNOSIS — G43001 Migraine without aura, not intractable, with status migrainosus: Secondary | ICD-10-CM

## 2019-07-10 DIAGNOSIS — M722 Plantar fascial fibromatosis: Secondary | ICD-10-CM

## 2019-07-10 DIAGNOSIS — E038 Other specified hypothyroidism: Secondary | ICD-10-CM

## 2019-07-10 DIAGNOSIS — J309 Allergic rhinitis, unspecified: Secondary | ICD-10-CM

## 2019-07-10 MED ORDER — PREGABALIN 150 MG PO CAPS: 150.0000 mg | ORAL_CAPSULE | Freq: Two times a day (BID) | ORAL | 2 refills | Status: DC

## 2019-07-10 NOTE — Telephone Encounter (Signed)
Pt says she needs authorization from Dr. Amalia Hailey to receive her medication, per her insurance. Please advise

## 2019-07-11 NOTE — Telephone Encounter (Addendum)
I spoke with patient and informed her that insurance denied paying for medication.  I told her that I called insurance and started an appeal and I am waiting on Dr. Amalia Hailey to finish his last office note that needs to be faxed to [place] fax: [phone].  Reference # [id number]    I informed her that I would let her know once it is approved.  She verbalized understanding FYI, Gabapentin causes alopecia in this patient

## 2019-07-11 NOTE — Telephone Encounter (Signed)
Pt has called stating she needed Dr,Evans to give authorization on medication please assist

## 2019-07-12 NOTE — Progress Notes (Signed)
   Subjective: 54 year old female presenting today for follow up evaluation of bilateral plantar fasciitis and idiopathic peripheral neuropathy. She reports having "nerve pain" in both feet. She states the pain radiates up the bilateral lower extremities. Standing increases the pain. She has been wearing Brooks running shoes which help ease the discomfort. She was taking Gabapentin but reports it caused alopecia so she discontinued use. Patient is here for further evaluation and treatment.    Past Medical History:  Diagnosis Date  . Acquired pyloric stricture   . Bleeding duodenal ulcer   . Chronic esophagogastric ulcer   . Depression   . Duodenal obstruction   . Gastric outlet obstruction 01/19/2018  . GERD (gastroesophageal reflux disease)   . History of stomach ulcers   . Hypothyroidism   . Incisional hernia, without obstruction or gangrene   . Intractable vomiting   . Kidney stones   . Migraine   . Migraine headache    only1-2x/month since starting topamax  . Side pain 03/30/2018     Objective: Physical Exam General: The patient is alert and oriented x3 in no acute distress.  Dermatology: Skin is warm, dry and supple bilateral lower extremities. Negative for open lesions or macerations bilateral.   Vascular: Dorsalis Pedis and Posterior Tibial pulses palpable bilateral.  Capillary fill time is immediate to all digits.  Neurological: Epicritic and protective threshold intact bilateral.   Musculoskeletal: Tenderness to palpation to the plantar aspect of the bilateral heels along the plantar fascia. All other joints range of motion within normal limits bilateral. Strength 5/5 in all groups bilateral.   Assessment: 1. plantar fasciitis bilateral feet  2. Idiopathic peripheral neuropathy bilateral   Plan of Care:  1. Patient evaluated.  2. Injection of 0.5cc Celestone soluspan injected into the bilateral heels.  3. Prescription for Lyrica 150 mg BID provided to  patient. 4. Patient denies any lumbar symptoms.  5. Continue wearing Brooks shoes. Do not go barefoot.  6. Return to clinic as needed.   Quit working for [employer]. Currently caring for mother.    Edrick Kins, DPM [facility]  Dr. Edrick Kins, DPM    [address], [place] [address]                Office [phone]  Fax [phone]

## 2019-07-13 NOTE — Telephone Encounter (Signed)
"  Please call me about that medicine.  My feet are about to kill me."

## 2019-07-16 NOTE — Telephone Encounter (Signed)
I spoke with Tanya Nguyen with Tanya Nguyen, the appeals is still open and being reviewed.  I called and informed patient.  She stated that the pharmacist also recommended Cymbalta as an alternative.  I told her I would ask Dr. Amalia Hailey if he would prescribe this instead and let her know.  She verbalized understanding.

## 2019-07-23 ENCOUNTER — Encounter: Payer: Self-pay | Admitting: Primary Care

## 2019-07-23 ENCOUNTER — Other Ambulatory Visit: Payer: Self-pay | Admitting: Primary Care

## 2019-07-23 ENCOUNTER — Ambulatory Visit (INDEPENDENT_AMBULATORY_CARE_PROVIDER_SITE_OTHER): Payer: 59 | Admitting: Primary Care

## 2019-07-23 ENCOUNTER — Other Ambulatory Visit: Payer: Self-pay

## 2019-07-23 VITALS — BP 106/60 | HR 105 | Temp 97.9°F | Ht 59.0 in | Wt 105.0 lb

## 2019-07-23 DIAGNOSIS — G609 Hereditary and idiopathic neuropathy, unspecified: Secondary | ICD-10-CM | POA: Diagnosis not present

## 2019-07-23 DIAGNOSIS — F3130 Bipolar disorder, current episode depressed, mild or moderate severity, unspecified: Secondary | ICD-10-CM

## 2019-07-23 DIAGNOSIS — Z23 Encounter for immunization: Secondary | ICD-10-CM | POA: Diagnosis not present

## 2019-07-23 DIAGNOSIS — G43001 Migraine without aura, not intractable, with status migrainosus: Secondary | ICD-10-CM

## 2019-07-23 MED ORDER — DULOXETINE HCL 20 MG PO CPEP: 20.0000 mg | ORAL_CAPSULE | Freq: Every day | ORAL | 1 refills | Status: DC

## 2019-07-23 NOTE — Progress Notes (Signed)
Subjective:    Patient ID: Tanya Nguyen, female    DOB: 1965-04-08, 54 y.o.   MRN: [id number]  HPI  This visit occurred during the SARS-CoV-2 public health emergency.  Safety protocols were in place, including screening questions prior to the visit, additional usage of staff PPE, and extensive cleaning of exam room while observing appropriate contact time as indicated for disinfecting solutions.   Tanya Nguyen is a 54 year old female with a history of migraines, GERD, hypothyroidism, Bipolar I Disorder, chronic abdominal pain, insomnia, fatigue who presents today to discuss neuropathy. She is also requesting her first Shingrix vaccine.  She is currently following with podiatry for neuropathy. She endorses burning, "stabbing pain" to bilateral plantar feet with bright red color. This will occur everyday if she's on her feet for prolonged periods of time. She is currently prescribed Lyrica 150 mg, but her insurance will not cover so she has yet to pick this up.   She cannot take gabapentin as it caused hair loss. She tried taking some cyclobenzaprine 10 mg with improvement in discomfort. It was recommended by [facility] that she consider trying Cymbalta.   BP Readings from Last 3 Encounters:  07/23/19 106/60  06/15/19 116/70  02/09/19 114/74     Review of Systems  Neurological:       Plantar neuropathy, bilaterally   Psychiatric/Behavioral: Negative for sleep disturbance. The patient is not nervous/anxious.        Past Medical History:  Diagnosis Date  . Acquired pyloric stricture   . Bleeding duodenal ulcer   . Chronic esophagogastric ulcer   . Depression   . Duodenal obstruction   . Gastric outlet obstruction 01/19/2018  . GERD (gastroesophageal reflux disease)   . History of stomach ulcers   . Hypothyroidism   . Incisional hernia, without obstruction or gangrene   . Intractable vomiting   . Kidney stones   . Migraine   . Migraine headache    only1-2x/month since starting topamax   . Side pain 03/30/2018     Social History   Socioeconomic History  . Marital status: Married    Spouse name: Not on file  . Number of children: Not on file  . Years of education: Not on file  . Highest education level: Not on file  Occupational History  . Not on file  Tobacco Use  . Smoking status: Never Smoker  . Smokeless tobacco: Never Used  Substance and Sexual Activity  . Alcohol use: Not Currently    Alcohol/week: 0.0 standard drinks  . Drug use: No  . Sexual activity: Yes    Birth control/protection: None  Other Topics Concern  . Not on file  Social History Narrative  . Not on file   Social Determinants of Health   Financial Resource Strain:   . Difficulty of Paying Living Expenses:   Food Insecurity:   . Worried About Charity fundraiser in the Last Year:   . Arboriculturist in the Last Year:   Transportation Needs:   . Film/video editor (Medical):   Marland Kitchen Lack of Transportation (Non-Medical):   Physical Activity:   . Days of Exercise per Week:   . Minutes of Exercise per Session:   Stress:   . Feeling of Stress :   Social Connections:   . Frequency of Communication with Friends and Family:   . Frequency of Social Gatherings with Friends and Family:   . Attends Religious Services:   . Active Member of  Clubs or Organizations:   . Attends Archivist Meetings:   Marland Kitchen Marital Status:   Intimate Partner Violence:   . Fear of Current or Ex-Partner:   . Emotionally Abused:   Marland Kitchen Physically Abused:   . Sexually Abused:     Past Surgical History:  Procedure Laterality Date  . ABDOMINAL HYSTERECTOMY  2004   partial  . CHOLECYSTECTOMY    . COLONOSCOPY WITH PROPOFOL N/A 02/27/2018   Procedure: COLONOSCOPY WITH PROPOFOL;  Surgeon: [name], MD;  Location: [place];  Service: Endoscopy;  Laterality: N/A;  . ESOPHAGOGASTRODUODENOSCOPY (EGD) WITH PROPOFOL N/A 01/12/2018   Procedure: ESOPHAGOGASTRODUODENOSCOPY (EGD) WITH BIOPSIES;  Surgeon:  [name], MD;  Location: [place];  Service: Endoscopy;  Laterality: N/A;  . ESOPHAGOGASTRODUODENOSCOPY (EGD) WITH PROPOFOL N/A 01/25/2018   Procedure: ESOPHAGOGASTRODUODENOSCOPY (EGD) WITH PROPOFOL;  Surgeon: Jonathon Bellows, MD;  Location: [facility] ENDOSCOPY;  Service: Gastroenterology;  Laterality: N/A;  . ESOPHAGOGASTRODUODENOSCOPY (EGD) WITH PROPOFOL N/A 02/27/2018   Procedure: ESOPHAGOGASTRODUODENOSCOPY (EGD) WITH PROPOFOL;  Surgeon: [name], MD;  Location: [place];  Service: Endoscopy;  Laterality: N/A;  . INCISIONAL HERNIA REPAIR N/A 11/23/2018   Procedure: HERNIA REPAIR INCISIONAL;  Surgeon: Olean Ree, MD;  Location: [facility];  Service: General;  Laterality: N/A;  . INSERTION OF MESH N/A 11/23/2018   Procedure: INSERTION OF MESH;  Surgeon: Olean Ree, MD;  Location: [facility];  Service: General;  Laterality: N/A;  . LAPAROTOMY N/A 01/20/2018   Procedure: EXPLORATORY LAPAROTOMY;  Surgeon: Olean Ree, MD;  Location: [facility];  Service: General;  Laterality: N/A;  . TOOTH EXTRACTION      Family History  Problem Relation Age of Onset  . Cancer Mother   . Breast cancer Mother 52  . Cancer Father   . Alcohol abuse Sister   . Bipolar disorder Sister   . Alcohol abuse Maternal Grandmother   . Stroke Maternal Grandmother   . Stroke Paternal Grandmother     Allergies  Allergen Reactions  . Penicillins Shortness Of Breath, Diarrhea and Nausea And Vomiting    Has patient had a PCN reaction causing immediate rash, facial/tongue/throat swelling, SOB or lightheadedness with hypotension: yes Has patient had a PCN reaction causing severe rash involving mucus membranes or skin necrosis: no Has patient had a PCN reaction that required hospitalization no Has patient had a PCN reaction occurring within the last 10 years: about 10 years If all of the above answers are "NO", then may proceed with Cephalosporin use.   . Gabapentin     Hair loss  . Sulfa  Antibiotics Nausea And Vomiting    Other reaction(s): VOMITING    Current Outpatient Medications on File Prior to Visit  Medication Sig Dispense Refill  . cetirizine (ZYRTEC) 10 MG tablet TAKE 1 TABLET BY MOUTH EVERY DAY AS NEEDED FOR ALLERGY 30 tablet 5  . fluticasone (FLONASE) 50 MCG/ACT nasal spray Place 1 spray into both nostrils 2 (two) times daily. 16 g 0  . levothyroxine (SYNTHROID) 100 MCG tablet TAKE 1 TABLET BY MOUTH EVERY MORNING ON EMPTY STOMACH WITH WATER. NO FOOD OR OTHER MEDS FOR 30 MIN 90 tablet 1  . pantoprazole (PROTONIX) 40 MG tablet Take 40 mg by mouth daily.    . SUMAtriptan (IMITREX) 25 MG tablet TAKE 1 TABLET BY MOUTH (MAY REPEAT IN 2 HOURS IF HEADACHE PERSISTS OR RECURS.) 9 tablet 0  . topiramate (TOPAMAX) 50 MG tablet TAKE 1 TABLET (50 MG TOTAL) BY MOUTH AT BEDTIME. FOR MIGRAINE  PREVENTION. 90 tablet 1  . traZODone (DESYREL) 150 MG tablet TAKE 1 TABLET (150 MG TOTAL) BY MOUTH AT BEDTIME. 90 tablet 1  . pregabalin (LYRICA) 150 MG capsule Take 1 capsule (150 mg total) by mouth 2 (two) times daily. (Patient not taking: Reported on 07/23/2019) 60 capsule 2   No current facility-administered medications on file prior to visit.    BP 106/60 (BP Location: Left Arm, Patient Position: Sitting, Cuff Size: Normal)   Pulse (!) 105   Temp 97.9 F (36.6 C)   Ht 4\' 11"  (1.499 m)   Wt 105 lb (47.6 kg)   SpO2 99%   BMI 21.21 kg/m    Objective:   Physical Exam  Constitutional: She appears well-nourished.  Cardiovascular: Normal rate and regular rhythm.  Skin: Skin is warm and dry.  Psychiatric: She has a normal mood and affect.           Assessment & Plan:

## 2019-07-23 NOTE — Addendum Note (Signed)
Addended by: Pilar Grammes on: 07/23/2019 11:58 AM   Modules accepted: Orders

## 2019-07-23 NOTE — Assessment & Plan Note (Signed)
Switching to Cymbalta 20 mg due to chronic foot pain secondary to neuropathy.   Reduce Zoloft to 1/2 tablet daily x 1 week while initiating Cymbalta 20 mg. Stop 1/2 tablet of Zoloft after one week.   She will update.

## 2019-07-23 NOTE — Assessment & Plan Note (Signed)
Following with podiatry, receiving steroid injections.  Cannot afford Lyrica.   Will trial Cymbalta 20 mg. Stop Zoloft. Discussed to update me in 3-4 weeks. Discussed potential side effects.

## 2019-07-23 NOTE — Patient Instructions (Addendum)
Cut your sertraline (Zoloft) pill in half and take 1/2 tablet once daily for one week, then stop.  Start duloxetine (Cymbalta) 20 mg once daily now.   Please update me in 3-4 weeks as discussed.   Schedule a nurse visit in 2-6 months for your second Shingrix vaccine.  It was a pleasure to see you today!

## 2019-08-01 ENCOUNTER — Other Ambulatory Visit: Payer: Self-pay | Admitting: Primary Care

## 2019-08-01 DIAGNOSIS — E559 Vitamin D deficiency, unspecified: Secondary | ICD-10-CM

## 2019-08-01 DIAGNOSIS — E038 Other specified hypothyroidism: Secondary | ICD-10-CM

## 2019-08-02 ENCOUNTER — Telehealth: Payer: Self-pay | Admitting: Primary Care

## 2019-08-02 NOTE — Telephone Encounter (Signed)
Patient was asked to call and let Tanya Nguyen know how she's doing on the Duloxetine.  Patient's feet are feeling better and patient wants to know if it can be upped a little bit because her feet start to hurt in the evening. [facility]Marland Kitchen

## 2019-08-02 NOTE — Telephone Encounter (Signed)
Spoken and notified patient of Kate Clark's comments. Patient verbalized understanding.  

## 2019-08-02 NOTE — Telephone Encounter (Signed)
Yes, okay to increase to 2 capsules. Have her update me in 2 weeks.

## 2019-08-08 ENCOUNTER — Other Ambulatory Visit: Payer: Self-pay

## 2019-08-08 ENCOUNTER — Encounter: Payer: Self-pay | Admitting: Primary Care

## 2019-08-08 ENCOUNTER — Ambulatory Visit (INDEPENDENT_AMBULATORY_CARE_PROVIDER_SITE_OTHER): Payer: 59 | Admitting: Primary Care

## 2019-08-08 VITALS — BP 100/64 | HR 104 | Temp 98.3°F | Ht 59.0 in | Wt 105.0 lb

## 2019-08-08 DIAGNOSIS — E038 Other specified hypothyroidism: Secondary | ICD-10-CM

## 2019-08-08 DIAGNOSIS — G43001 Migraine without aura, not intractable, with status migrainosus: Secondary | ICD-10-CM

## 2019-08-08 DIAGNOSIS — E559 Vitamin D deficiency, unspecified: Secondary | ICD-10-CM

## 2019-08-08 DIAGNOSIS — M722 Plantar fascial fibromatosis: Secondary | ICD-10-CM

## 2019-08-08 DIAGNOSIS — R300 Dysuria: Secondary | ICD-10-CM

## 2019-08-08 LAB — POCT URINALYSIS DIPSTICK
Blood, UA: NEGATIVE
Glucose, UA: NEGATIVE
Ketones, UA: NEGATIVE
Leukocytes, UA: NEGATIVE
Nitrite, UA: NEGATIVE
Protein, UA: NEGATIVE
Spec Grav, UA: 1.025 (ref 1.010–1.025)
Urobilinogen, UA: 0.2 E.U./dL
pH, UA: 6 (ref 5.0–8.0)

## 2019-08-08 MED ORDER — DULOXETINE HCL 20 MG PO CPEP: 40.0000 mg | ORAL_CAPSULE | Freq: Every day | ORAL | 1 refills | Status: DC

## 2019-08-08 MED ORDER — KETOROLAC TROMETHAMINE 60 MG/2ML IM SOLN
60.0000 mg | Freq: Once | INTRAMUSCULAR | Status: AC
  Administered 2019-08-08: 60 mg via INTRAMUSCULAR

## 2019-08-08 NOTE — Assessment & Plan Note (Signed)
Recent headache likely induced by stress at home.  Notified patient to pick up the Imitrex Rx at the pharmacy. Continue Topamax.   IM Toradol provided today.  She will update if headaches become more frequent.

## 2019-08-08 NOTE — Assessment & Plan Note (Signed)
Compliant to vitamin D 500 IU daily. Repeat vitamin D pending.

## 2019-08-08 NOTE — Assessment & Plan Note (Signed)
Improved with duloxetine, continue 40 mg daily. Refills sent to pharmacy.

## 2019-08-08 NOTE — Patient Instructions (Signed)
Stop by the lab prior to leaving today. I will notify you of your results once received.   Continue Topamax daily for headaches. Use the sumatriptan if needed for breakthrough migraines.   We will be in touch with your urine culture results.  Continue duloxetine 40 mg for foot pain.  It was a pleasure to see you today!

## 2019-08-08 NOTE — Assessment & Plan Note (Signed)
She is taking levothyroxine correctly, repeat TSH pending. 

## 2019-08-08 NOTE — Progress Notes (Signed)
Subjective:    Patient ID: Tanya Nguyen, female    DOB: 22-Jan-1966, 54 y.o.   MRN: [id number]  HPI  This visit occurred during the SARS-CoV-2 public health emergency.  Safety protocols were in place, including screening questions prior to the visit, additional usage of staff PPE, and extensive cleaning of exam room while observing appropriate contact time as indicated for disinfecting solutions.   Tanya Nguyen is a 54 year old female with a history of acute cystitis, migraines, GERD, hypothyroidism, insomnia, fatigue, PTSD, anxiety, chronic abdominal pain who presents today with multiple issues.  She has noticed symptoms of dysuria and foul smelling urine. This began 2-3 days ago. She denies vaginal discharge, hematuria.   She also reports a new headache that began to the right parietal lobe about three days ago. She has been compliant to her Topamax daily which has been very effective. She's been very stressed at home with the care of her elderly mother who recently fell. She will likely have to put her mother into a long term care facility.  She has not taken any Imitrex a she did not realize she had a refill at the pharmacy.   Her chronic foot pain has improved with duloxetine in general. She recently increased her dose to 40 mg and has noticed even more improvement.   Review of Systems  Gastrointestinal: Negative for abdominal pain.  Genitourinary: Positive for dysuria. Negative for vaginal discharge.       See HPI  Musculoskeletal: Positive for arthralgias.  Neurological: Positive for headaches.       Past Medical History:  Diagnosis Date  . Acquired pyloric stricture   . Bleeding duodenal ulcer   . Chronic esophagogastric ulcer   . Depression   . Duodenal obstruction   . Gastric outlet obstruction 01/19/2018  . GERD (gastroesophageal reflux disease)   . History of stomach ulcers   . Hypothyroidism   . Incisional hernia, without obstruction or gangrene   . Intractable  vomiting   . Kidney stones   . Migraine   . Migraine headache    only1-2x/month since starting topamax  . Side pain 03/30/2018     Social History   Socioeconomic History  . Marital status: Married    Spouse name: Not on file  . Number of children: Not on file  . Years of education: Not on file  . Highest education level: Not on file  Occupational History  . Not on file  Tobacco Use  . Smoking status: Never Smoker  . Smokeless tobacco: Never Used  Substance and Sexual Activity  . Alcohol use: Not Currently    Alcohol/week: 0.0 standard drinks  . Drug use: No  . Sexual activity: Yes    Birth control/protection: None  Other Topics Concern  . Not on file  Social History Narrative  . Not on file   Social Determinants of Health   Financial Resource Strain:   . Difficulty of Paying Living Expenses:   Food Insecurity:   . Worried About Charity fundraiser in the Last Year:   . Arboriculturist in the Last Year:   Transportation Needs:   . Film/video editor (Medical):   Marland Kitchen Lack of Transportation (Non-Medical):   Physical Activity:   . Days of Exercise per Week:   . Minutes of Exercise per Session:   Stress:   . Feeling of Stress :   Social Connections:   . Frequency of Communication with Friends and Family:   .  Frequency of Social Gatherings with Friends and Family:   . Attends Religious Services:   . Active Member of Clubs or Organizations:   . Attends Archivist Meetings:   Marland Kitchen Marital Status:   Intimate Partner Violence:   . Fear of Current or Ex-Partner:   . Emotionally Abused:   Marland Kitchen Physically Abused:   . Sexually Abused:     Past Surgical History:  Procedure Laterality Date  . ABDOMINAL HYSTERECTOMY  2004   partial  . CHOLECYSTECTOMY    . COLONOSCOPY WITH PROPOFOL N/A 02/27/2018   Procedure: COLONOSCOPY WITH PROPOFOL;  Surgeon: [name], MD;  Location: [place];  Service: Endoscopy;  Laterality: N/A;  . ESOPHAGOGASTRODUODENOSCOPY  (EGD) WITH PROPOFOL N/A 01/12/2018   Procedure: ESOPHAGOGASTRODUODENOSCOPY (EGD) WITH BIOPSIES;  Surgeon: [name], MD;  Location: [place];  Service: Endoscopy;  Laterality: N/A;  . ESOPHAGOGASTRODUODENOSCOPY (EGD) WITH PROPOFOL N/A 01/25/2018   Procedure: ESOPHAGOGASTRODUODENOSCOPY (EGD) WITH PROPOFOL;  Surgeon: Jonathon Bellows, MD;  Location[facility];  Service: Gastroenterology;  Laterality: N/A;  . ESOPHAGOGASTRODUODENOSCOPY (EGD) WITH PROPOFOL N/A 02/27/2018   Procedure: ESOPHAGOGASTRODUODENOSCOPY (EGD) WITH PROPOFOL;  Surgeon: [name], MD;  Location: [place];  Service: Endoscopy;  Laterality: N/A;  . INCISIONAL HERNIA REPAIR N/A 11/23/2018   Procedure: HERNIA REPAIR INCISIONAL;  Surgeon: Olean Ree, MD;  Location: [facility];  Service: General;  Laterality: N/A;  . INSERTION OF MESH N/A 11/23/2018   Procedure: INSERTION OF MESH;  Surgeon: Olean Ree, MD;  Location: [facility];  Service: General;  Laterality: N/A;  . LAPAROTOMY N/A 01/20/2018   Procedure: EXPLORATORY LAPAROTOMY;  Surgeon: Olean Ree, MD;  Location: [facility];  Service: General;  Laterality: N/A;  . TOOTH EXTRACTION      Family History  Problem Relation Age of Onset  . Cancer Mother   . Breast cancer Mother 3  . Cancer Father   . Alcohol abuse Sister   . Bipolar disorder Sister   . Alcohol abuse Maternal Grandmother   . Stroke Maternal Grandmother   . Stroke Paternal Grandmother     Allergies  Allergen Reactions  . Penicillins Shortness Of Breath, Diarrhea and Nausea And Vomiting    Has patient had a PCN reaction causing immediate rash, facial/tongue/throat swelling, SOB or lightheadedness with hypotension: yes Has patient had a PCN reaction causing severe rash involving mucus membranes or skin necrosis: no Has patient had a PCN reaction that required hospitalization no Has patient had a PCN reaction occurring within the last 10 years: about 10 years If all of the above  answers are "NO", then may proceed with Cephalosporin use.   . Gabapentin     Hair loss  . Sulfa Antibiotics Nausea And Vomiting    Other reaction(s): VOMITING    Current Outpatient Medications on File Prior to Visit  Medication Sig Dispense Refill  . cetirizine (ZYRTEC) 10 MG tablet TAKE 1 TABLET BY MOUTH EVERY DAY AS NEEDED FOR ALLERGY 30 tablet 5  . fluticasone (FLONASE) 50 MCG/ACT nasal spray Place 1 spray into both nostrils 2 (two) times daily. 16 g 0  . levothyroxine (SYNTHROID) 100 MCG tablet TAKE 1 TABLET BY MOUTH EVERY MORNING ON EMPTY STOMACH WITH WATER. NO FOOD OR OTHER MEDS FOR 30 MIN 90 tablet 1  . pantoprazole (PROTONIX) 40 MG tablet Take 40 mg by mouth daily.    . SUMAtriptan (IMITREX) 25 MG tablet TAKE 1 TABLET BY MOUTH (MAY REPEAT IN 2 HOURS IF HEADACHE PERSISTS OR RECURS.) 9 tablet  0  . topiramate (TOPAMAX) 50 MG tablet TAKE 1 TABLET (50 MG TOTAL) BY MOUTH AT BEDTIME. FOR MIGRAINE PREVENTION. 90 tablet 1  . traZODone (DESYREL) 150 MG tablet TAKE 1 TABLET (150 MG TOTAL) BY MOUTH AT BEDTIME. 90 tablet 1   No current facility-administered medications on file prior to visit.    BP 100/64   Pulse (!) 104   Temp 98.3 F (36.8 C) (Temporal)   Ht 4\' 11"  (1.499 m)   Wt 105 lb (47.6 kg)   SpO2 98%   BMI 21.21 kg/m    Objective:   Physical Exam  Constitutional: She appears well-nourished.  Cardiovascular: Normal rate and regular rhythm.  Respiratory: Effort normal and breath sounds normal.  Musculoskeletal:     Cervical back: Neck supple.  Skin: Skin is warm and dry.  Psychiatric: She has a normal mood and affect.           Assessment & Plan:

## 2019-08-08 NOTE — Assessment & Plan Note (Signed)
Acute for the last 2-3 days. UA today negative. Culture sent given her history. Await results.

## 2019-08-09 LAB — TSH: TSH: 0.17 u[IU]/mL — ABNORMAL LOW (ref 0.35–4.50)

## 2019-08-09 LAB — VITAMIN D 25 HYDROXY (VIT D DEFICIENCY, FRACTURES): VITD: 28.37 ng/mL — ABNORMAL LOW (ref 30.00–100.00)

## 2019-08-10 ENCOUNTER — Telehealth: Payer: Self-pay | Admitting: Primary Care

## 2019-08-10 LAB — URINE CULTURE: Organism ID, Bacteria: NO GROWTH

## 2019-08-10 NOTE — Telephone Encounter (Signed)
Spoken to patient and addressed in result note

## 2019-08-10 NOTE — Telephone Encounter (Signed)
Patient called in regards to her urinalysis results  She wanted to know if we have received the results

## 2019-08-16 ENCOUNTER — Other Ambulatory Visit: Payer: 59

## 2019-08-16 ENCOUNTER — Ambulatory Visit: Payer: 59

## 2019-08-17 ENCOUNTER — Other Ambulatory Visit: Payer: 59

## 2019-08-24 ENCOUNTER — Other Ambulatory Visit: Payer: Self-pay | Admitting: Primary Care

## 2019-08-24 MED ORDER — TRAZODONE HCL 50 MG PO TABS
150.0000 mg | ORAL_TABLET | Freq: Every day | ORAL | 1 refills | Status: DC
Start: 2019-08-24 — End: 2021-03-04

## 2019-08-24 NOTE — Telephone Encounter (Signed)
Received a fax that patient cannot swallow the 150 mg and asked to change to take (3) of 50 mg  Per Allie Bossier ok to change.

## 2019-08-31 ENCOUNTER — Telehealth: Payer: Self-pay

## 2019-08-31 NOTE — Telephone Encounter (Signed)
Pt reports she is crying and shaking a lot since she started the cymbalta.  Pt requested to stop cymbalta and start zoloft again. Advised this was ok and if any problems to contact this office.  Pt verbalized understanding.

## 2019-08-31 NOTE — Telephone Encounter (Signed)
Please try to confirm from patient. I am happy to resume Zoloft, we just need to confirm that this is per the patient's request.

## 2019-08-31 NOTE — Telephone Encounter (Signed)
Received a call on triage VM from an unnamed person stating the pt was changed from Zoloft to Cymbalta recently. Said she is having more anxiety and feeling worse. Said she needs to go back on Zoloft. They did not leave their name.  I tried to call back to verify this message and had to leave a message.   Will forward to Alma who may know who called about the pt.

## 2019-09-01 ENCOUNTER — Other Ambulatory Visit: Payer: Self-pay | Admitting: Primary Care

## 2019-09-01 DIAGNOSIS — G43001 Migraine without aura, not intractable, with status migrainosus: Secondary | ICD-10-CM

## 2019-09-24 ENCOUNTER — Telehealth: Payer: Self-pay | Admitting: Primary Care

## 2019-09-24 DIAGNOSIS — E038 Other specified hypothyroidism: Secondary | ICD-10-CM

## 2019-09-24 DIAGNOSIS — E559 Vitamin D deficiency, unspecified: Secondary | ICD-10-CM

## 2019-09-24 NOTE — Telephone Encounter (Signed)
Orders placed.

## 2019-09-24 NOTE — Telephone Encounter (Signed)
Please advise 

## 2019-09-24 NOTE — Telephone Encounter (Signed)
Patient called and wanted to schedule labs for vitamin D and thyroid, since she forgot to schedule them last visit. I got her scheduled for next Monday the 26th at 8:45 AM. Can you please let me know when orders are placed? Thank you!

## 2019-09-27 ENCOUNTER — Ambulatory Visit: Payer: 59

## 2019-10-01 ENCOUNTER — Telehealth: Payer: Self-pay | Admitting: Primary Care

## 2019-10-01 ENCOUNTER — Other Ambulatory Visit: Payer: 59

## 2019-10-01 NOTE — Telephone Encounter (Signed)
Left detail message for patient.  Pneumococcal vaccine for adults 42 through 55 years old are recommended for those who have certain medical conditions or who smoke.  Patient does not have any of the conditions that would recommended getting the pneumococcal vaccine before 65

## 2019-10-01 NOTE — Telephone Encounter (Signed)
Pt has appointment tomorrow for 2nd shindgrix and wanted to know if she could get a pneumonia vaccine also

## 2019-10-02 ENCOUNTER — Other Ambulatory Visit: Payer: Self-pay

## 2019-10-02 ENCOUNTER — Ambulatory Visit (INDEPENDENT_AMBULATORY_CARE_PROVIDER_SITE_OTHER): Payer: 59 | Admitting: *Deleted

## 2019-10-02 ENCOUNTER — Other Ambulatory Visit (INDEPENDENT_AMBULATORY_CARE_PROVIDER_SITE_OTHER): Payer: 59

## 2019-10-02 DIAGNOSIS — Z23 Encounter for immunization: Secondary | ICD-10-CM

## 2019-10-02 DIAGNOSIS — E038 Other specified hypothyroidism: Secondary | ICD-10-CM

## 2019-10-02 DIAGNOSIS — E559 Vitamin D deficiency, unspecified: Secondary | ICD-10-CM | POA: Diagnosis not present

## 2019-10-02 NOTE — Progress Notes (Signed)
Per orders of Allie Bossier, NP, injection of Shingrix #2 given by Virl Cagey. Patient tolerated injection well.

## 2019-10-02 NOTE — Telephone Encounter (Signed)
Patient have been notified.  

## 2019-10-03 LAB — TSH: TSH: 0.11 u[IU]/mL — ABNORMAL LOW (ref 0.35–4.50)

## 2019-10-03 LAB — VITAMIN D 25 HYDROXY (VIT D DEFICIENCY, FRACTURES): VITD: 36.81 ng/mL (ref 30.00–100.00)

## 2019-10-05 ENCOUNTER — Encounter: Payer: Self-pay | Admitting: Primary Care

## 2019-10-05 ENCOUNTER — Other Ambulatory Visit: Payer: Self-pay

## 2019-10-05 ENCOUNTER — Ambulatory Visit (INDEPENDENT_AMBULATORY_CARE_PROVIDER_SITE_OTHER): Payer: 59 | Admitting: Primary Care

## 2019-10-05 VITALS — BP 106/68 | HR 102 | Temp 96.4°F | Ht 59.0 in | Wt 107.5 lb

## 2019-10-05 DIAGNOSIS — G609 Hereditary and idiopathic neuropathy, unspecified: Secondary | ICD-10-CM

## 2019-10-05 DIAGNOSIS — F3130 Bipolar disorder, current episode depressed, mild or moderate severity, unspecified: Secondary | ICD-10-CM

## 2019-10-05 DIAGNOSIS — E559 Vitamin D deficiency, unspecified: Secondary | ICD-10-CM

## 2019-10-05 DIAGNOSIS — E038 Other specified hypothyroidism: Secondary | ICD-10-CM

## 2019-10-05 MED ORDER — LEVOTHYROXINE SODIUM 88 MCG PO TABS: ORAL_TABLET | ORAL | 1 refills | Status: DC

## 2019-10-05 NOTE — Assessment & Plan Note (Signed)
Recent TSH of 0.11, she is taking levothyroxine correctly.  Reduce dose down to 88 mcg. Repeat TSH again in 2 months.

## 2019-10-05 NOTE — Patient Instructions (Signed)
Stop taking levothyroxine 100 mcg.  Start taking levothyroxine 88 mcg daily for thyroid.  Be sure to take your levothyroxine (thyroid medication) every morning on an empty stomach with water only. No food or other medications for 30 minutes. No heartburn medication, iron pills, calcium, vitamin D, or magnesium pills within four hours of taking levothyroxine.   You can try Capsicin topical cream for your foot pain.  You can try taking Alpha-Lipoic Acid for foot pain.  You will be contacted regarding your referral to neurology.  Please let us know if you have not been contacted within two weeks.   It was a pleasure to see you today!

## 2019-10-05 NOTE — Assessment & Plan Note (Signed)
Recent level improved. Continue oral vitamin D.

## 2019-10-05 NOTE — Assessment & Plan Note (Signed)
Unclear if this is actually the cause for her symptoms, or if something else is going on.  No improvement with nearly every treatment, including during podiatry visits.  Given newer symptoms of radiation of pain up lower extremities, imbalance, increased pain, we will send to neurology for other causes.   Referral placed. Discussed Capsaicin and Alpha lipoic acid. Consider low dose cyclobenzaprine, have her cut in hafl (2.5 mg) if needed.

## 2019-10-05 NOTE — Assessment & Plan Note (Signed)
Anxiety increased when switching to duloxetine and stopping Zoloft. Resumed Zoloft and is doing better. Continue Zoloft.

## 2019-10-05 NOTE — Progress Notes (Signed)
Subjective:    Patient ID: Tanya Nguyen, female    DOB: 1966-02-02, 54 y.o.   MRN: [id number]  HPI  This visit occurred during the SARS-CoV-2 public health emergency.  Safety protocols were in place, including screening questions prior to the visit, additional usage of staff PPE, and extensive cleaning of exam room while observing appropriate contact time as indicated for disinfecting solutions.   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  1) Neuropathy: Previously following with podiatry for neuropathy, symptoms of stabbing pain to bilateral plantar feet with erythematous color. Insurance would not cover Lyrica, and she had hair loss with gabapentin. We initiated duloxetine and increased her dose gradually up to 40 mg with improvement in symptoms. Given the initiation of duloxetine, we had to discontinue Zoloft.   Today she continues to noticed pain to her bilateral feet with redness. She's also noticed radiation of pain up to her mid lower extremities with some imbalance. She's unable to stand/walk for prolonged periods of time. She's also noticing pain during sleep. She has purchased nice supportive shoes with support without improvement.  The main change in her symptoms is increased pain, imbalance, and radiation of pain up her lower extremities. She's been taking cyclobenzaprine and Tramadol with some improvement.  2) Hypothyroidism: Currently she is taking her levothyroxine 100 mcg every morning on an empty stomach with water only. She avoids food and other medications for at least 30 minutes. She separates vitamins and PPI for at least four hours.    Review of Systems  Skin: Positive for color change.  Neurological: Positive for weakness.       Bilateral planar foot pain with radiation up lower  extremities.   Psychiatric/Behavioral: The patient is not nervous/anxious.        Past Medical History:  Diagnosis Date  . Acquired pyloric stricture   . Bleeding duodenal ulcer   . Chronic esophagogastric ulcer   . Depression   . Duodenal obstruction   . Gastric outlet obstruction 01/19/2018  . GERD (gastroesophageal reflux disease)   . History of stomach ulcers   . Hypothyroidism   . Incisional hernia, without obstruction or gangrene   . Intractable vomiting   . Kidney stones   . Migraine   . Migraine headache    only1-2x/month since starting topamax  . Side pain 03/30/2018     Social History   Socioeconomic History  . Marital status: Married    Spouse name: Not on file  . Number of children: Not on file  . Years of education: Not on file  . Highest education level: Not on file  Occupational History  . Not on file  Tobacco Use  . Smoking status: Never Smoker  . Smokeless tobacco: Never Used  Vaping Use  . Vaping Use: Never used  Substance and Sexual Activity  . Alcohol use: Not Currently    Alcohol/week: 0.0 standard drinks  . Drug use: No  . Sexual activity: Yes    Birth control/protection: None  Other Topics Concern  . Not on file  Social History Narrative  . Not on file   Social Determinants of Health   Financial Resource Strain:   . Difficulty of Paying Living Expenses:   Food Insecurity:   . Worried About Charity fundraiser in the Last Year:   .  Ran Out of Food in the Last Year:   Transportation Needs:   . Film/video editor (Medical):   Marland Kitchen Lack of Transportation (Non-Medical):   Physical Activity:   . Days of Exercise per Week:   . Minutes of Exercise per Session:   Stress:   . Feeling of Stress :   Social Connections:   . Frequency of Communication with Friends and Family:   . Frequency of Social Gatherings with Friends and Family:   . Attends Religious Services:   . Active Member of Clubs or Organizations:   . Attends Theatre manager Meetings:   Marland Kitchen Marital Status:   Intimate Partner Violence:   . Fear of Current or Ex-Partner:   . Emotionally Abused:   Marland Kitchen Physically Abused:   . Sexually Abused:     Past Surgical History:  Procedure Laterality Date  . ABDOMINAL HYSTERECTOMY  2004   partial  . CHOLECYSTECTOMY    . COLONOSCOPY WITH PROPOFOL N/A 02/27/2018   Procedure: COLONOSCOPY WITH PROPOFOL;  Surgeon: [name], MD;  Location: [place];  Service: Endoscopy;  Laterality: N/A;  . ESOPHAGOGASTRODUODENOSCOPY (EGD) WITH PROPOFOL N/A 01/12/2018   Procedure: ESOPHAGOGASTRODUODENOSCOPY (EGD) WITH BIOPSIES;  Surgeon: [name], MD;  Location: [place] [place];  Service: Endoscopy;  Laterality: N/A;  . ESOPHAGOGASTRODUODENOSCOPY (EGD) WITH PROPOFOL N/A 01/25/2018   Procedure: ESOPHAGOGASTRODUODENOSCOPY (EGD) WITH PROPOFOL;  Surgeon: Jonathon Bellows, MD;  Location[facility] ENDOSCOPY;  Service: Gastroenterology;  Laterality: N/A;  . ESOPHAGOGASTRODUODENOSCOPY (EGD) WITH PROPOFOL N/A 02/27/2018   Procedure: ESOPHAGOGASTRODUODENOSCOPY (EGD) WITH PROPOFOL;  Surgeon: [name], MD;  Location: [place];  Service: Endoscopy;  Laterality: N/A;  . INCISIONAL HERNIA REPAIR N/A 11/23/2018   Procedure: HERNIA REPAIR INCISIONAL;  Surgeon: Olean Ree, MD;  Location: [facility];  Service: General;  Laterality: N/A;  . INSERTION OF MESH N/A 11/23/2018   Procedure: INSERTION OF MESH;  Surgeon: Olean Ree, MD;  Location: [facility];  Service: General;  Laterality: N/A;  . LAPAROTOMY N/A 01/20/2018   Procedure: EXPLORATORY LAPAROTOMY;  Surgeon: Olean Ree, MD;  Location: [facility];  Service: General;  Laterality: N/A;  . TOOTH EXTRACTION      Family History  Problem Relation Age of Onset  . Cancer Mother   . Breast cancer Mother 33  . Cancer Father   . Alcohol abuse Sister   . Bipolar disorder Sister   . Alcohol abuse Maternal Grandmother   . Stroke Maternal Grandmother   . Stroke Paternal  Grandmother     Allergies  Allergen Reactions  . Penicillins Shortness Of Breath, Diarrhea and Nausea And Vomiting    Has patient had a PCN reaction causing immediate rash, facial/tongue/throat swelling, SOB or lightheadedness with hypotension: yes Has patient had a PCN reaction causing severe rash involving mucus membranes or skin necrosis: no Has patient had a PCN reaction that required hospitalization no Has patient had a PCN reaction occurring within the last 10 years: about 10 years If all of the above answers are "NO", then may proceed with Cephalosporin use.   . Gabapentin     Hair loss  . Sulfa Antibiotics Nausea And Vomiting    Other reaction(s): VOMITING    Current Outpatient Medications on File Prior to Visit  Medication Sig Dispense Refill  . cetirizine (ZYRTEC) 10 MG tablet TAKE 1 TABLET BY MOUTH EVERY DAY AS NEEDED FOR ALLERGY 30 tablet 5  . fluticasone (FLONASE) 50 MCG/ACT nasal spray Place 1 spray into both nostrils 2 (two) times  daily. 16 g 0  . pantoprazole (PROTONIX) 40 MG tablet Take 40 mg by mouth daily.    . sertraline (ZOLOFT) 50 MG tablet Take 50 mg by mouth daily.    . SUMAtriptan (IMITREX) 25 MG tablet TAKE 1 TABLET BY MOUTH (MAY REPEAT IN 2 HOURS IF HEADACHE PERSISTS OR RECURS.) 9 tablet 0  . topiramate (TOPAMAX) 50 MG tablet TAKE 1 TABLET (50 MG TOTAL) BY MOUTH AT BEDTIME. FOR MIGRAINE PREVENTION. 90 tablet 1  . traZODone (DESYREL) 50 MG tablet Take 3 tablets (150 mg total) by mouth at bedtime. 270 tablet 1   No current facility-administered medications on file prior to visit.    BP 106/68   Pulse 102   Temp (!) 96.4 F (35.8 C) (Temporal)   Ht 4\' 11"  (1.499 m)   Wt 107 lb 8 oz (48.8 kg)   SpO2 98%   BMI 21.71 kg/m    Objective:   Physical Exam Cardiovascular:     Rate and Rhythm: Normal rate and regular rhythm.  Pulmonary:     Effort: Pulmonary effort is normal.     Breath sounds: Normal breath sounds.  Musculoskeletal:     Cervical back:  Neck supple.  Feet:     Comments: Moderate tenderness with palpation to bilateral plantar feet, primarily at heels and balls of feet. No erythema. She is ambulatory in the clinic without too much difficulty.  Skin:    General: Skin is warm and dry.  Neurological:     Mental Status: She is alert and oriented to person, place, and time.            Assessment & Plan:

## 2019-10-08 ENCOUNTER — Telehealth: Payer: Self-pay | Admitting: Primary Care

## 2019-10-08 DIAGNOSIS — G609 Hereditary and idiopathic neuropathy, unspecified: Secondary | ICD-10-CM

## 2019-10-08 MED ORDER — CYCLOBENZAPRINE HCL 5 MG PO TABS: 2.5000 mg | ORAL_TABLET | Freq: Three times a day (TID) | ORAL | 0 refills | Status: DC | PRN

## 2019-10-08 NOTE — Telephone Encounter (Signed)
Patient called stating she would like a refill on a muscle relaxer (she doesn't know the name of it) for feet pain. She wants enough to get her by before her appointment with Neurologist. Please give patient a call at [phone].

## 2019-10-08 NOTE — Telephone Encounter (Signed)
Spoken to patient and she she was referring to cyclobenzaprine (FLEXERIL) 5mg   Last prescribed on 11/23/2018 Last seen on 10/05/2019

## 2019-10-08 NOTE — Telephone Encounter (Signed)
Noted, prescription sent to pharmacy. 

## 2019-10-11 ENCOUNTER — Telehealth: Payer: Self-pay | Admitting: Primary Care

## 2019-10-11 DIAGNOSIS — M722 Plantar fascial fibromatosis: Secondary | ICD-10-CM

## 2019-10-11 DIAGNOSIS — F3130 Bipolar disorder, current episode depressed, mild or moderate severity, unspecified: Secondary | ICD-10-CM

## 2019-10-11 DIAGNOSIS — G43001 Migraine without aura, not intractable, with status migrainosus: Secondary | ICD-10-CM

## 2019-10-11 NOTE — Telephone Encounter (Signed)
Patient stated that she was stress because of mom and believe it is not because of Cymbalta. When she took the Cymbalta this morning, she noticed it helps with the pain in feet and overall feels better

## 2019-10-11 NOTE — Telephone Encounter (Signed)
Patient called in stating she would like to know if PCP will restart her duloxetine 20mg , instead of her other medication for depression. Patient feels that this works better for her. Please advise.

## 2019-10-11 NOTE — Telephone Encounter (Signed)
Please clarify. She told me last week that the duloxetine (Cymbalta) caused increased anxiety, this is why we switched her back to Zoloft. Which is it?

## 2019-10-12 NOTE — Telephone Encounter (Signed)
Just to clarify before restarting Cymbalta, is she currently still taking Sertraline?

## 2019-10-16 MED ORDER — TOPIRAMATE 50 MG PO TABS: 50.0000 mg | ORAL_TABLET | Freq: Every day | ORAL | 1 refills | Status: DC

## 2019-10-16 NOTE — Telephone Encounter (Signed)
Spoken to patient and she confirm that she had stopped taking sertraline. Patient is currently taking Cymbalta 20 mg and it has ease the pain in her feet and anixety. Patient wanted to know if Anda Kraft can increased the dosage or not. I have inform patient that Anda Kraft is out of the office and will not be back until 10/24/2019. Patient will wait until then.  Also refilled Topamax as requested for patient.

## 2019-10-16 NOTE — Addendum Note (Signed)
Addended by: Jacqualin Combes on: 10/16/2019 10:37 AM   Modules accepted: Orders

## 2019-10-18 ENCOUNTER — Other Ambulatory Visit: Payer: Self-pay | Admitting: Primary Care

## 2019-10-18 DIAGNOSIS — Z1231 Encounter for screening mammogram for malignant neoplasm of breast: Secondary | ICD-10-CM

## 2019-10-24 MED ORDER — DULOXETINE HCL 30 MG PO CPEP: 30.0000 mg | ORAL_CAPSULE | Freq: Every day | ORAL | 1 refills | Status: DC

## 2019-10-24 NOTE — Telephone Encounter (Signed)
Yes, okay to increase the dose to 30 mg, I can send in a new prescription for the 30 mg dose.  Does this sound good?

## 2019-10-24 NOTE — Addendum Note (Signed)
Addended by: Pleas Koch on: 10/24/2019 04:40 PM   Modules accepted: Orders

## 2019-10-24 NOTE — Telephone Encounter (Signed)
Spoken and notified patient of Tanya Nguyen comments. Patient is agreeable and [facility]

## 2019-10-24 NOTE — Telephone Encounter (Signed)
Noted, Rx for Cymbalta 30 mg sent to pharmacy/

## 2019-11-05 ENCOUNTER — Telehealth: Payer: Self-pay | Admitting: Primary Care

## 2019-11-05 NOTE — Telephone Encounter (Signed)
Left message for patient to call office to discuss cyclobenzaprine use.

## 2019-11-05 NOTE — Telephone Encounter (Signed)
Please notify patient that we initiated duloxetine (Cymbalta) to help with pain. The cyclobenzaprine medication was temporary until we could get her pain under better control. It's not meant to be used everyday.   Did she get scheduled with the neurologist?  Also, in regards to her levothyroxine,  I provided her 30 tablets with one refill last month. Have her refill the extra refill.

## 2019-11-05 NOTE — Telephone Encounter (Signed)
Medication Refill - Medication:  cyclobenzaprine (FLEXERIL) 5 MG tablet  levothyroxine (SYNTHROID) 88 MCG tablet   Has the patient contacted their pharmacy? Advised to call office to refill.   Preferred Pharmacy (with phone number or street name):  [id number] [place], [place] - [address] Phone:  [phone]  Fax:  [phone]

## 2019-11-06 ENCOUNTER — Telehealth: Payer: Self-pay | Admitting: Primary Care

## 2019-11-06 DIAGNOSIS — G609 Hereditary and idiopathic neuropathy, unspecified: Secondary | ICD-10-CM

## 2019-11-06 MED ORDER — CYCLOBENZAPRINE HCL 5 MG PO TABS: 2.5000 mg | ORAL_TABLET | Freq: Every day | ORAL | 0 refills | Status: DC | PRN

## 2019-11-06 NOTE — Telephone Encounter (Signed)
Pt called to get a refill on cyclobenzaprine.  She stated she had bottle of meds in pocket book and she lost them.  She stated she lost them on Saturday.  [facility] [address]  Pt had 1 pill at house she took this morning.

## 2019-11-06 NOTE — Telephone Encounter (Signed)
Spoke with patient and advise that new prescription for cyclobenzaprine was sent to pharmacy with new directions.

## 2019-11-06 NOTE — Telephone Encounter (Signed)
Please notify patient that I will provide a refill of her cyclobenzaprine temporarily, but she will need to discuss symptoms further with her neurologist in October.  Have her try to cut the tablets in half in order to make them last longer.

## 2019-11-06 NOTE — Telephone Encounter (Signed)
Patient does not see neurology until October.  She says she uses the cyclobenzaprine daily but cannot go without.

## 2019-11-07 ENCOUNTER — Other Ambulatory Visit: Payer: Self-pay | Admitting: Primary Care

## 2019-11-07 DIAGNOSIS — G609 Hereditary and idiopathic neuropathy, unspecified: Secondary | ICD-10-CM

## 2019-11-14 ENCOUNTER — Telehealth: Payer: Self-pay | Admitting: *Deleted

## 2019-11-14 NOTE — Telephone Encounter (Signed)
"  Call me back."

## 2019-11-15 ENCOUNTER — Ambulatory Visit
Admission: RE | Admit: 2019-11-15 | Discharge: 2019-11-15 | Disposition: A | Discharge status: Home / Self Care | Payer: 59 | Source: Ambulatory Visit | Attending: Primary Care | Admitting: Primary Care

## 2019-11-15 DIAGNOSIS — Z1231 Encounter for screening mammogram for malignant neoplasm of breast: Secondary | ICD-10-CM | POA: Insufficient documentation

## 2019-11-22 ENCOUNTER — Telehealth: Payer: Self-pay | Admitting: Primary Care

## 2019-11-22 ENCOUNTER — Other Ambulatory Visit: Payer: Self-pay

## 2019-11-22 DIAGNOSIS — G43001 Migraine without aura, not intractable, with status migrainosus: Secondary | ICD-10-CM

## 2019-11-22 MED ORDER — SUMATRIPTAN SUCCINATE 25 MG PO TABS: ORAL_TABLET | ORAL | 0 refills | Status: DC

## 2019-11-22 NOTE — Telephone Encounter (Signed)
I have not seen it any luck finding?

## 2019-11-22 NOTE — Telephone Encounter (Signed)
Have you seen this ppw?

## 2019-11-22 NOTE — Telephone Encounter (Signed)
Refill sent in

## 2019-11-22 NOTE — Telephone Encounter (Signed)
I have not seen this or really know what she's referring to.

## 2019-11-22 NOTE — Telephone Encounter (Signed)
Pt called needing to get refill on sumatriptan she stated she only gets 9 pills at a time.  She thinks this is what she needs for migraines  [facility] [address]

## 2019-11-22 NOTE — Telephone Encounter (Signed)
Pt stated she came in last week to sign a paperwork for medicals for disability.  Do you have that paperwork?  I don't see if up front

## 2019-11-22 NOTE — Telephone Encounter (Signed)
Do you remember Tanya Nguyen signing any paperwork last week. Tanya Nguyen stated Tanya Nguyen came and signed paperwork for records

## 2019-11-23 ENCOUNTER — Other Ambulatory Visit: Payer: Self-pay

## 2019-11-23 ENCOUNTER — Encounter: Payer: Self-pay | Admitting: Podiatry

## 2019-11-23 ENCOUNTER — Ambulatory Visit (INDEPENDENT_AMBULATORY_CARE_PROVIDER_SITE_OTHER): Payer: 59 | Admitting: Podiatry

## 2019-11-23 DIAGNOSIS — G609 Hereditary and idiopathic neuropathy, unspecified: Secondary | ICD-10-CM | POA: Diagnosis not present

## 2019-11-23 DIAGNOSIS — M722 Plantar fascial fibromatosis: Secondary | ICD-10-CM | POA: Diagnosis not present

## 2019-11-23 MED ORDER — PREGABALIN 150 MG PO CAPS: 150.0000 mg | ORAL_CAPSULE | Freq: Two times a day (BID) | ORAL | 1 refills | Status: DC

## 2019-11-23 MED ORDER — METHYLPREDNISOLONE 4 MG PO TBPK: ORAL_TABLET | ORAL | 0 refills | Status: DC

## 2019-11-23 MED ORDER — TRAMADOL HCL 50 MG PO TABS: 50.0000 mg | ORAL_TABLET | ORAL | 0 refills | Status: DC | PRN

## 2019-11-23 NOTE — Telephone Encounter (Signed)
Able to locate med rec request. Called patient and let her know that it has been located and that it will be sent over to our copying service. I advised her that it can take 15-17 business days to be processed and she verbalized understanding.

## 2019-11-23 NOTE — Progress Notes (Signed)
° °  Subjective: 54 year old female presenting today for follow up evaluation of bilateral plantar fasciitis and idiopathic peripheral neuropathy.  Patient continues to get burning sensation to the bilateral feet.  She has an appointment with the neurologist on 12/13/2019.  She continues having "nerve pain" in both feet.  She states that the pain radiates up to the bilateral ankles.  Walking aggravates the pain.  She wears good supportive running shoes and so far conservative treatments have been unsuccessful.  She presents for further treatment and evaluation    Past Medical History:  Diagnosis Date   Acquired pyloric stricture    Bleeding duodenal ulcer    Chronic esophagogastric ulcer    Depression    Duodenal obstruction    Gastric outlet obstruction 01/19/2018   GERD (gastroesophageal reflux disease)    History of stomach ulcers    Hypothyroidism    Incisional hernia, without obstruction or gangrene    Intractable vomiting    Kidney stones    Migraine    Migraine headache    only1-2x/month since starting topamax   Side pain 03/30/2018     Objective: Physical Exam General: The patient is alert and oriented x3 in no acute distress.  Dermatology: Skin is warm, dry and supple bilateral lower extremities. Negative for open lesions or macerations bilateral.   Vascular: Dorsalis Pedis and Posterior Tibial pulses palpable bilateral.  Capillary fill time is immediate to all digits.  Neurological: Epicritic and protective threshold intact bilateral.   Musculoskeletal: Tenderness to palpation to the plantar aspect of the bilateral heels along the plantar fascia. All other joints range of motion within normal limits bilateral. Strength 5/5 in all groups bilateral.   Assessment: 1. plantar fasciitis bilateral feet  2. Idiopathic peripheral neuropathy bilateral   Plan of Care:  1. Patient evaluated.  2. Injection of 0.5cc Celestone soluspan injected into the bilateral  heels.  3. Prescription for Lyrica 150 mg BID provided to patient.  Patient was prescribed Lyrica last visit however her insurance did not cover the prescription.  She states that she has new insurance now which will potentially cover the Lyrica prescription 4. Patient denies any lumbar symptoms.  5. Continue wearing Brooks shoes. Do not go barefoot.  6.  Prescription for tramadol 50 mg every 4 hours as needed pain  7.  Patient has appointment with neurologist on 12/13/2019.  In the meantime continue Cymbalta and Flexeril from PCP 8.  Return to clinic in 1 month  Quit working for [employer]. Currently caring for mother.    Edrick Kins, DPM [facility]  Dr. Edrick Kins, DPM    [address], [place] [address]                Office [phone]  Fax [phone]

## 2019-11-23 NOTE — Telephone Encounter (Signed)
Tanya Nguyen call back checking on paperwork she signed.   She stated we called her to come in and sign paperwork (sounds like medial records release)    She stated she came in 9/9 or 9/10 to sign paperwork so she could get records for disability.  She stated it was a white girl with pony tail sitting in front window on left.  Not the window looking at lab door.   Does anyone remember getting paperwork or know who received this paperwork

## 2019-11-26 ENCOUNTER — Telehealth: Payer: Self-pay

## 2019-11-26 IMAGING — CT CT ABD-PELV W/ CM
2 of 5 series · 15 of 46 positions shown, 17 images · IV contrast (APPLIED)
Comparison: KUB of 04/08/2014

CLINICAL DATA: Unintended weight loss of 50 pounds in 6 months,
constipation, some nausea and vomiting

EXAM:
CT ABDOMEN AND PELVIS WITH CONTRAST
TECHNIQUE: Multidetector CT imaging of the abdomen and pelvis was performed
using the standard protocol following bolus administration of
intravenous contrast.
CONTRAST:  75mL OMNIPAQUE IOHEXOL 300 MG/ML  SOLN

[Series 2: routine abd/pel with · axial · 0.59mm/px · z∈[-846,-481]mm · 12 of 83 slices shown, 14 images]
[im 5/83  soft-tissue]
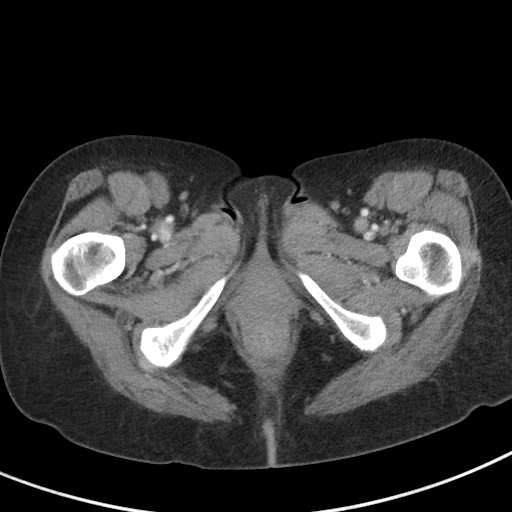
[im 5/83  bone]
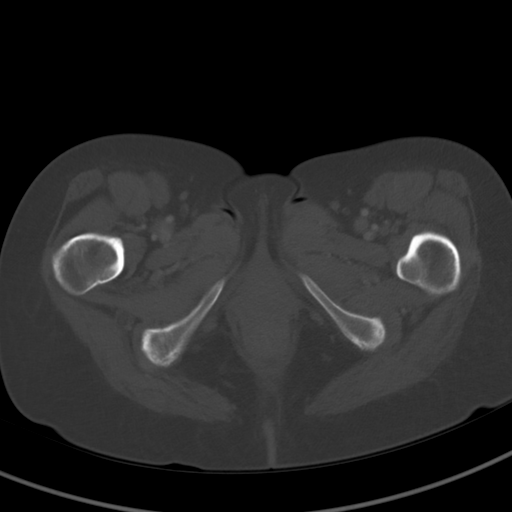
[im 13/83  soft-tissue]
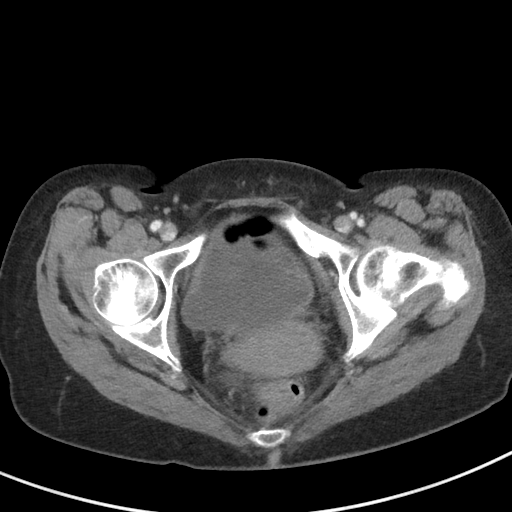
[im 18/83  soft-tissue]
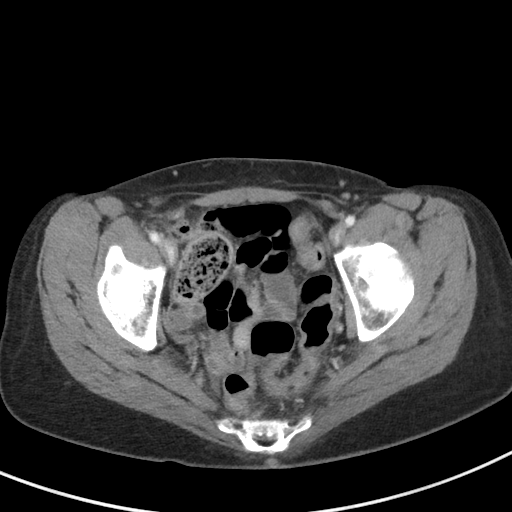
[im 26/83  soft-tissue]
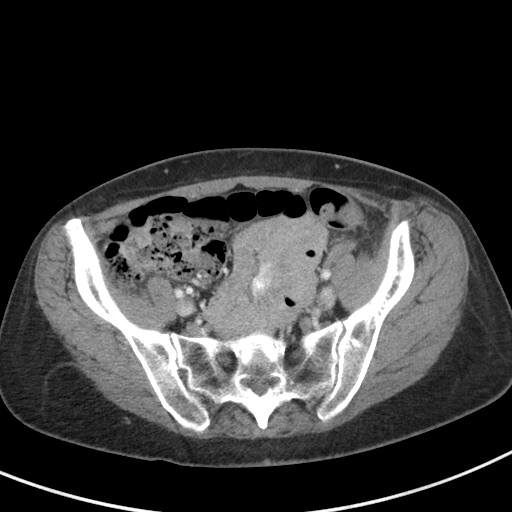
[im 31/83  soft-tissue]
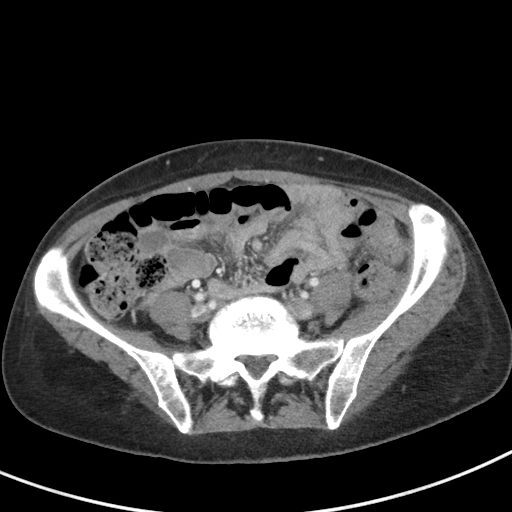
[im 39/83  soft-tissue]
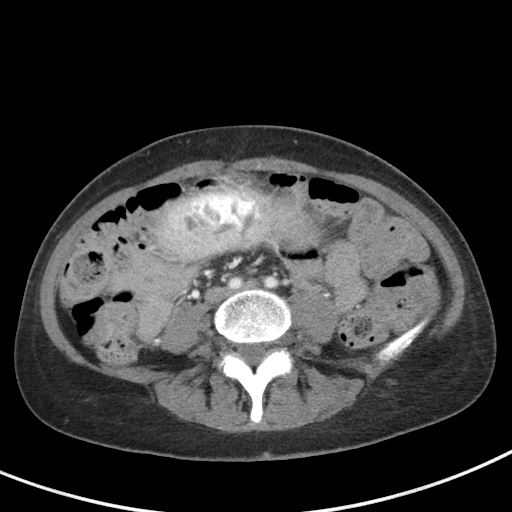
[im 44/83  soft-tissue]
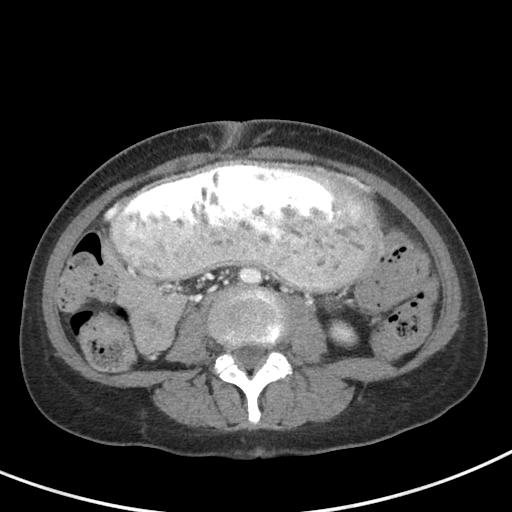
[im 52/83  soft-tissue]
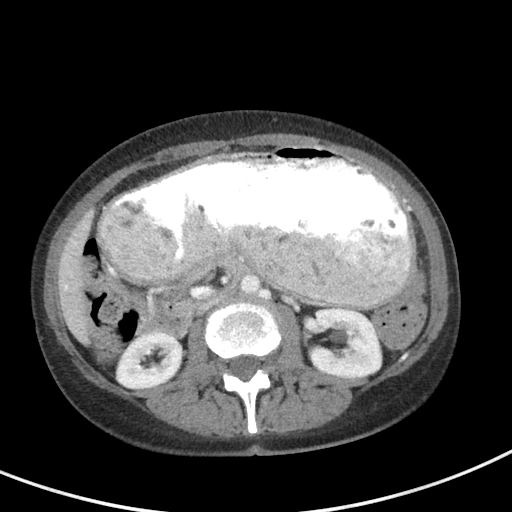
[im 57/83  soft-tissue]
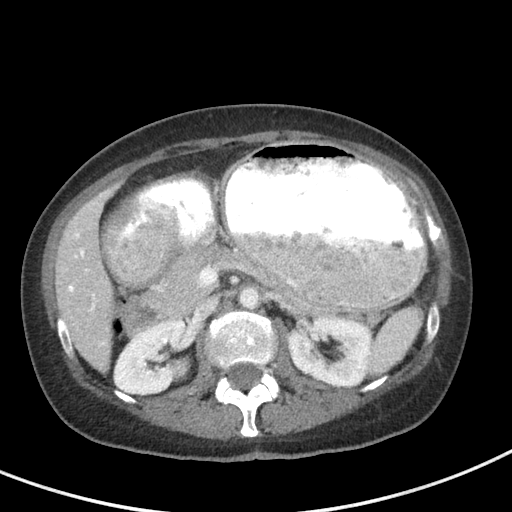
[im 57/83  bone]
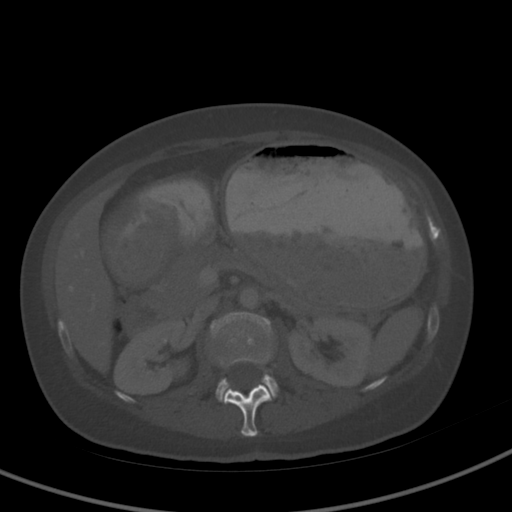
[im 65/83  soft-tissue]
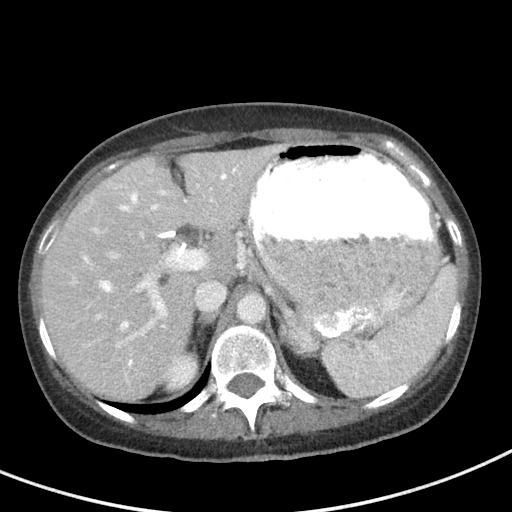
[im 70/83  soft-tissue]
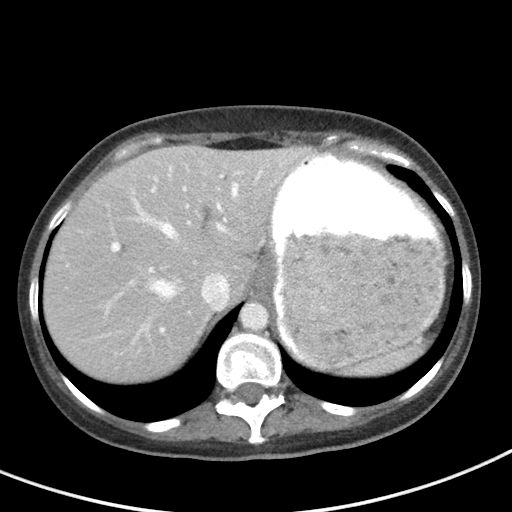
[im 78/83  soft-tissue]
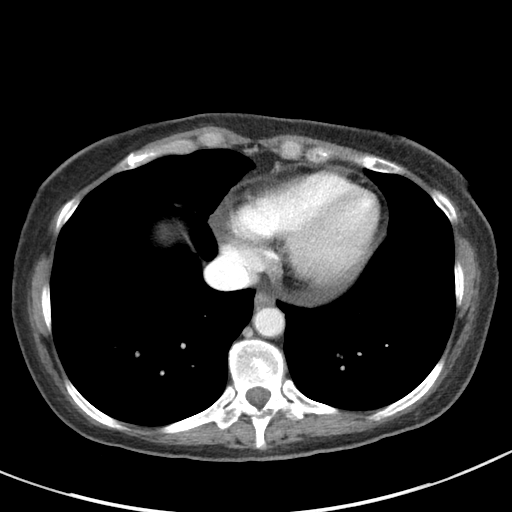

[Series 5: coronal st · coronal · 0.64mm/px · 3 of 70 slices shown]
[im 24/70  soft-tissue]
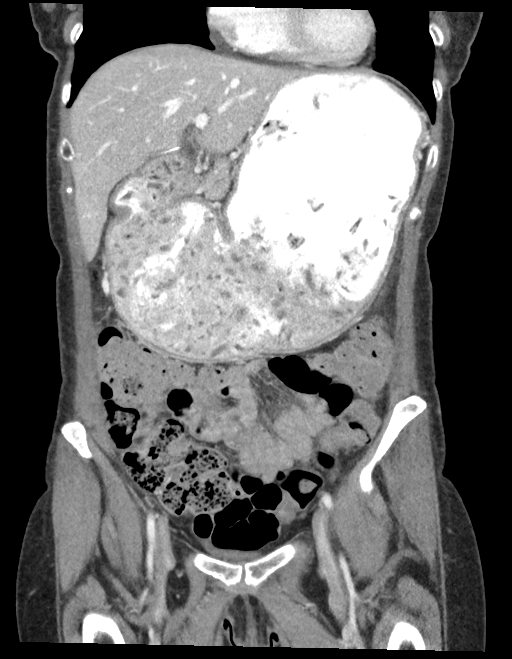
[im 31/70  soft-tissue]
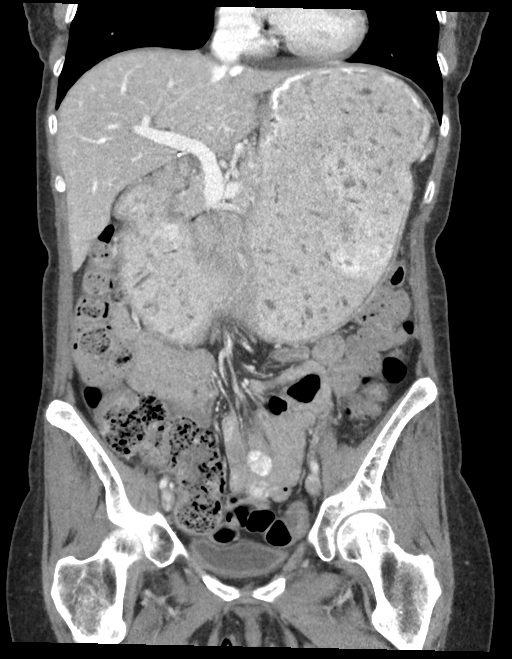
[im 39/70  soft-tissue]
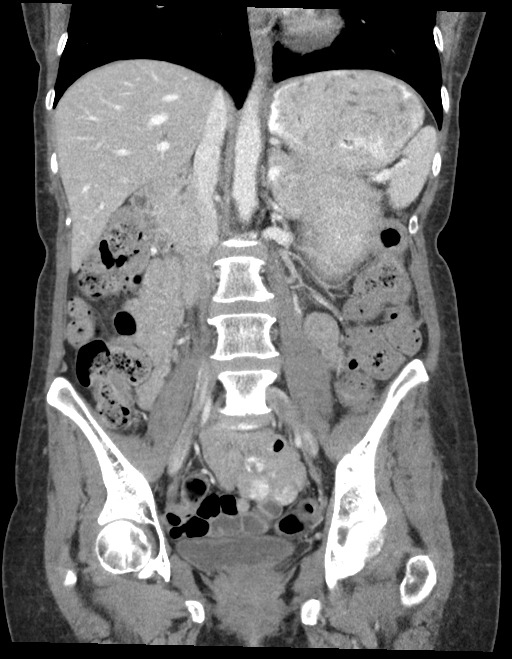

[15 of 46 positions shown; findings below may reference images not displayed]

FINDINGS: Lower chest: The lung bases are clear. The heart is within normal
limits in size. No pericardial effusion is seen.

Hepatobiliary: The liver enhances and there are 2 low-attenuation
structures 1 medially in the right lobe and 1 anteriorly in the left
lobe both of which are most typical of a benign process but cannot
be well assessed on this CT. On delayed images, both of these
lesions remain low attenuation probably representing small hepatic
cysts of doubtful clinical significance. If further assessment is
warranted, ultrasound would be helpful. No ductal dilatation is
seen. The gallbladder has previously been resected with surgical
clips noted.

Pancreas: The pancreas is normal in size and the pancreatic duct is
not dilated.

Spleen: The spleen is unremarkable.

Adrenals/Urinary Tract: The adrenal glands appear normal. The
kidneys enhance and there do appear to be small bilateral renal
calculi more numerous in the lower pole of the right kidney without
obstruction. None of these calculi is larger than 4-5 mm in
diameter. No hydronephrosis is seen. The ureters appear normal in
caliber. The urinary bladder is not well distended but no
abnormality is seen.

Stomach/Bowel: The stomach is massively dilated with contrast and
considerable food debris. Is there any suspicion of gastric outlet
obstruction? No definite mass is seen. The small bowel is not
dilated. There is a moderate amount of feces throughout the entire
colon. The terminal ileum is difficult to adequately visualized but
no edema or mass is seen. The appendix may extend posteriorly, but
it is not optimally visualized. No inflammatory process is noted
however within the right lower quadrant.

Vascular/Lymphatic: The abdominal aorta is normal in caliber. No
adenopathy is seen.

Reproductive: The uterus has previously been resected. No adnexal
lesion is seen. There is a small amount of fluid within the pelvis.

Other: No abdominal wall hernia is noted.

Musculoskeletal: The lumbar vertebrae are in normal alignment. There
is degenerative disc disease at L5-S1 where there is some loss of
disc space, sclerosis, spurring, and vacuum disc phenomenon present.
The SI joints appear corticated.
IMPRESSION: 1. Massive distention of the stomach with considerable retained
fluid and food debris. Is there any clinical suspicion of gastric
outlet obstruction? No definite mass is seen.
2. Bilateral renal calculi of no more than 4-5 mm in diameter. No
present obstruction.
3. Degenerative disc disease at L5-S1.
4. Probable small cysts within the liver. Consider ultrasound if
further assessment is warranted.

## 2019-11-26 IMAGING — DX DG ABDOMEN 1V
1 series · 1 of 1 positions shown · non-contrast
Comparison: CT abdomen 01/19/2018

CLINICAL DATA: Nasogastric tube placement

EXAM:
ABDOMEN - 1 VIEW

[abdomen supine]
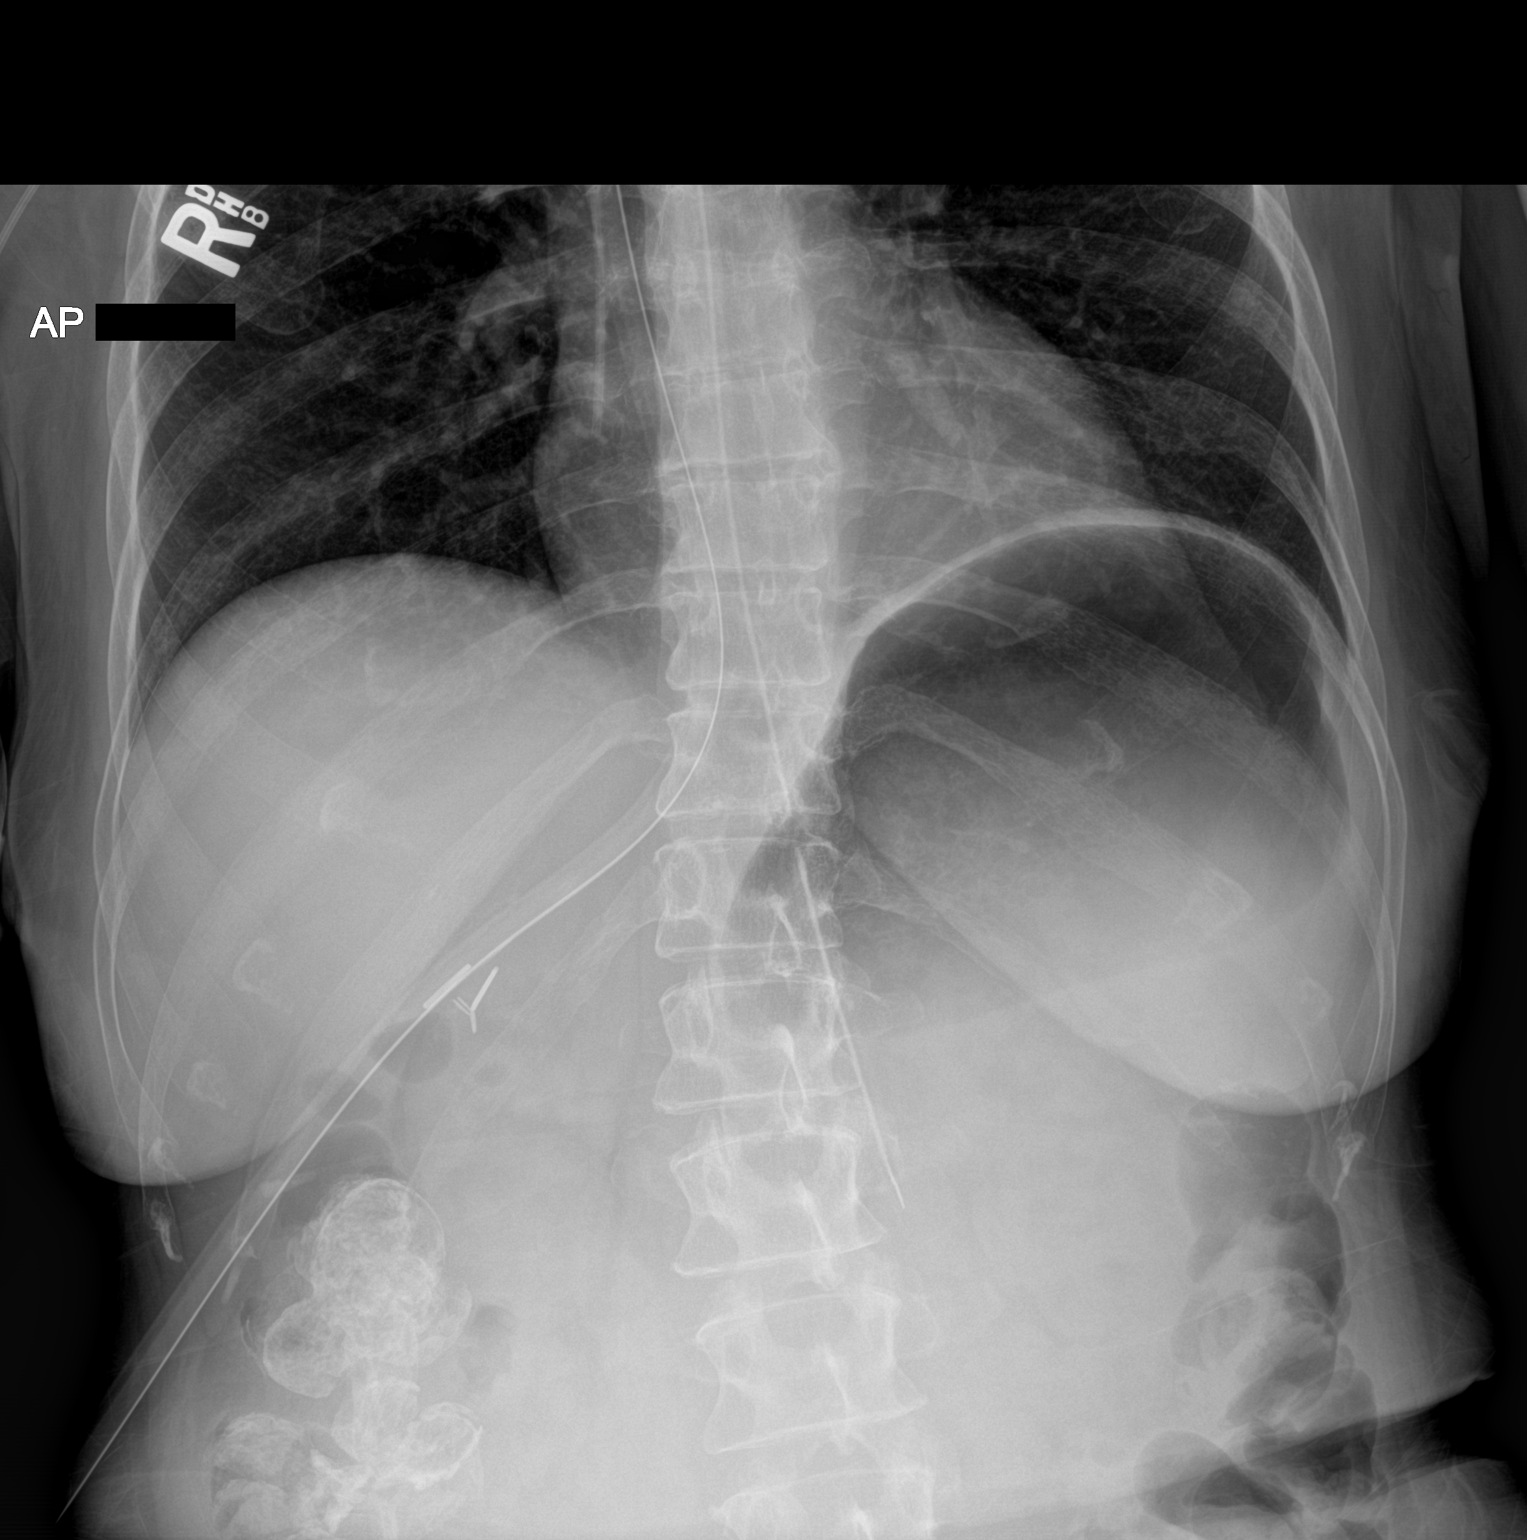

[1 of 1 positions shown; findings below may reference images not displayed]

FINDINGS: Distended stomach. The nasogastric tube tip is in the stomach body.
Small amount of contrast medium is in the right colon.
IMPRESSION: 1. The nasogastric tube tip and side port are in the stomach body.
The stomach appears distended.

## 2019-11-26 NOTE — Telephone Encounter (Signed)
Pt called on Friday 11/23/19 at 2pm in regards to her new medication Lyrica that [facility]. Please advise

## 2019-11-27 NOTE — Telephone Encounter (Signed)
I sent in request for PA yesterday, waiting on response

## 2019-11-28 ENCOUNTER — Telehealth: Payer: Self-pay

## 2019-11-28 NOTE — Telephone Encounter (Signed)
I tried to get approval for Lyrica, but insurance denied it.  I called for an appeal and she does not have any diagnosis that meets the criteria for approval.  I informed the patient and she said the medrol dose pack has helped but she still has a lot of burning pains in her feet.  She can't take Gabapentin.  Is there anything else that we can give her for relief?  She has appt with neuro on 12/13/19.   Please advise

## 2019-11-29 ENCOUNTER — Other Ambulatory Visit: Payer: Self-pay | Admitting: Primary Care

## 2019-11-29 DIAGNOSIS — E038 Other specified hypothyroidism: Secondary | ICD-10-CM

## 2019-12-05 ENCOUNTER — Other Ambulatory Visit: Payer: Self-pay | Admitting: Podiatry

## 2019-12-05 ENCOUNTER — Telehealth: Payer: Self-pay | Admitting: Podiatry

## 2019-12-05 NOTE — Telephone Encounter (Signed)
Please advise 

## 2019-12-05 NOTE — Telephone Encounter (Signed)
Pt would like a referral of Tramadol. [facility] [place]

## 2019-12-06 ENCOUNTER — Other Ambulatory Visit: Payer: Self-pay

## 2019-12-06 ENCOUNTER — Other Ambulatory Visit: Payer: Self-pay | Admitting: Primary Care

## 2019-12-06 ENCOUNTER — Other Ambulatory Visit (INDEPENDENT_AMBULATORY_CARE_PROVIDER_SITE_OTHER): Payer: 59

## 2019-12-06 DIAGNOSIS — E038 Other specified hypothyroidism: Secondary | ICD-10-CM

## 2019-12-06 DIAGNOSIS — E559 Vitamin D deficiency, unspecified: Secondary | ICD-10-CM | POA: Diagnosis not present

## 2019-12-06 LAB — VITAMIN D 25 HYDROXY (VIT D DEFICIENCY, FRACTURES): VITD: 44.37 ng/mL (ref 30.00–100.00)

## 2019-12-06 LAB — TSH: TSH: 0.91 u[IU]/mL (ref 0.35–4.50)

## 2019-12-06 MED ORDER — LEVOTHYROXINE SODIUM 88 MCG PO TABS: ORAL_TABLET | ORAL | 3 refills | Status: DC

## 2019-12-06 NOTE — Telephone Encounter (Addendum)
Error

## 2019-12-10 ENCOUNTER — Other Ambulatory Visit: Payer: Self-pay

## 2019-12-10 ENCOUNTER — Telehealth: Payer: Self-pay | Admitting: Primary Care

## 2019-12-10 DIAGNOSIS — E038 Other specified hypothyroidism: Secondary | ICD-10-CM

## 2019-12-10 NOTE — Telephone Encounter (Signed)
See lab results for documentation.  

## 2019-12-10 NOTE — Telephone Encounter (Signed)
Patient returning call from [facility] to discuss lab results. Please advise.

## 2019-12-13 ENCOUNTER — Encounter: Payer: Self-pay | Admitting: Neurology

## 2019-12-13 ENCOUNTER — Ambulatory Visit (INDEPENDENT_AMBULATORY_CARE_PROVIDER_SITE_OTHER): Payer: 59 | Admitting: Neurology

## 2019-12-13 VITALS — BP 100/72 | HR 92 | Ht 59.0 in | Wt 110.5 lb

## 2019-12-13 DIAGNOSIS — R202 Paresthesia of skin: Secondary | ICD-10-CM

## 2019-12-13 DIAGNOSIS — M722 Plantar fascial fibromatosis: Secondary | ICD-10-CM | POA: Diagnosis not present

## 2019-12-13 DIAGNOSIS — F3130 Bipolar disorder, current episode depressed, mild or moderate severity, unspecified: Secondary | ICD-10-CM | POA: Diagnosis not present

## 2019-12-13 MED ORDER — DULOXETINE HCL 60 MG PO CPEP: 60.0000 mg | ORAL_CAPSULE | Freq: Every day | ORAL | 11 refills | Status: DC

## 2019-12-13 MED ORDER — PREGABALIN 75 MG PO CAPS
75.0000 mg | ORAL_CAPSULE | Freq: Three times a day (TID) | ORAL | 4 refills | Status: DC
Start: 2019-12-13 — End: 2020-01-29

## 2019-12-13 NOTE — Progress Notes (Signed)
Chief Complaint  Patient presents with  . New Patient (Initial Visit)    She is here with her cousin, Tanya Nguyen. Reports pain, burning and tingling in her bilateral feet that is starting to travel up into her legs.  States gabapentin caused her hair to fall out. She was prescribed Lyrica 150mg , one capsule BID, but never started it due to cost. She takes duloxetine 30mg  for depression and anxiety but also in hopes that it would help with her pain.   Marland Kitchen PCP    Tanya Koch, NP    HISTORICAL  Tanya Nguyen is a 54 year old female, seen in request by her primary care nurse practitioner Tanya Nguyen, for evaluation of burning in her feet, initial evaluation was on December 13, 2019.  I reviewed and summarized the referring note.  Past medical history Hypothyroidism, on supplement Chronic migraine headaches  She complains of 2 years history of bilateral feet paresthesia, starting from 2019, she noticed bottom of her feet numbness, turning to red like apple, burning, painful, sometimes radiating sensation to her leg, going to her problems, she has difficulty sleeping, symptoms getting worse bearing weight  She denies bilateral hands paresthesia  Gabapentin because hair loss, she is on Cymbalta 30 mg daily, which is helpful, denies significant side effect  She was seen by podiatrist, had a cortisone injection, which helped her some,  Laboratory evaluations in July 2021 normal TSH,Vitamin D, LDL 121, normal CMP, creatinine 0.69, CBC, hemoglobin of 11.7  REVIEW OF SYSTEMS: Full 14 system review of systems performed and notable only for as above All other review of systems were negative.  ALLERGIES: Allergies  Allergen Reactions  . Penicillins Shortness Of Breath, Diarrhea and Nausea And Vomiting    Has patient had a PCN reaction causing immediate rash, facial/tongue/throat swelling, SOB or lightheadedness with hypotension: yes Has patient had a PCN reaction causing severe rash  involving mucus membranes or skin necrosis: no Has patient had a PCN reaction that required hospitalization no Has patient had a PCN reaction occurring within the last 10 years: about 10 years If all of the above answers are "NO", then may proceed with Cephalosporin use.   . Gabapentin     Hair loss  . Sulfa Antibiotics Nausea And Vomiting    Other reaction(s): VOMITING    HOME MEDICATIONS: Current Outpatient Medications  Medication Sig Dispense Refill  . cetirizine (ZYRTEC) 10 MG tablet TAKE 1 TABLET BY MOUTH EVERY DAY AS NEEDED FOR ALLERGY 30 tablet 5  . DULoxetine (CYMBALTA) 30 MG capsule Take 1 capsule (30 mg total) by mouth daily. For pain and anxiety. 90 capsule 1  . fluticasone (FLONASE) 50 MCG/ACT nasal spray Place 1 spray into both nostrils 2 (two) times daily. 16 g 0  . levothyroxine (SYNTHROID) 88 MCG tablet TAKE 1 TABLET BY MOUTH EVERY MORNING ON EMPTY STOMACH WITH WATER ONLY-NO FOOD OR OTHER MED X 30 MIN 90 tablet 3  . pantoprazole (PROTONIX) 40 MG tablet Take 40 mg by mouth daily.    . pregabalin (LYRICA) 150 MG capsule Take 1 capsule (150 mg total) by mouth 2 (two) times daily. 60 capsule 1  . SUMAtriptan (IMITREX) 25 MG tablet May repeat in 2 hours if headache persists or recurs. 9 tablet 0  . topiramate (TOPAMAX) 50 MG tablet Take 1 tablet (50 mg total) by mouth at bedtime. For migraine prevention. 90 tablet 1  . traZODone (DESYREL) 50 MG tablet Take 3 tablets (150 mg total) by mouth at bedtime.  270 tablet 1   No current facility-administered medications for this visit.    PAST MEDICAL HISTORY: Past Medical History:  Diagnosis Date  . Acquired pyloric stricture   . Bleeding duodenal ulcer   . Chronic esophagogastric ulcer   . Depression   . Duodenal obstruction   . Gastric outlet obstruction 01/19/2018  . GERD (gastroesophageal reflux disease)   . History of stomach ulcers   . Hypothyroidism   . Incisional hernia, without obstruction or gangrene   .  Intractable vomiting   . Kidney stones   . Migraine   . Migraine headache    only1-2x/month since starting topamax  . Neuropathy   . Side pain 03/30/2018    PAST SURGICAL HISTORY: Past Surgical History:  Procedure Laterality Date  . ABDOMINAL HYSTERECTOMY  2004   partial  . CHOLECYSTECTOMY    . COLONOSCOPY WITH PROPOFOL N/A 02/27/2018   Procedure: COLONOSCOPY WITH PROPOFOL;  Surgeon: [name], MD;  Location: [facility];  Service: Endoscopy;  Laterality: N/A;  . ESOPHAGOGASTRODUODENOSCOPY (EGD) WITH PROPOFOL N/A 01/12/2018   Procedure: ESOPHAGOGASTRODUODENOSCOPY (EGD) WITH BIOPSIES;  Surgeon: [name], MD;  Location: [facility];  Service: Endoscopy;  Laterality: N/A;  . ESOPHAGOGASTRODUODENOSCOPY (EGD) WITH PROPOFOL N/A 01/25/2018   Procedure: ESOPHAGOGASTRODUODENOSCOPY (EGD) WITH PROPOFOL;  Surgeon: Jonathon Bellows, MD;  Location: [facility] ENDOSCOPY;  Service: Gastroenterology;  Laterality: N/A;  . ESOPHAGOGASTRODUODENOSCOPY (EGD) WITH PROPOFOL N/A 02/27/2018   Procedure: ESOPHAGOGASTRODUODENOSCOPY (EGD) WITH PROPOFOL;  Surgeon: [name], MD;  Location: [place] [facility];  Service: Endoscopy;  Laterality: N/A;  . INCISIONAL HERNIA REPAIR N/A 11/23/2018   Procedure: HERNIA REPAIR INCISIONAL;  Surgeon: Olean Ree, MD;  Location: [facility];  Service: General;  Laterality: N/A;  . INSERTION OF MESH N/A 11/23/2018   Procedure: INSERTION OF MESH;  Surgeon: Olean Ree, MD;  Location: [facility];  Service: General;  Laterality: N/A;  . LAPAROTOMY N/A 01/20/2018   Procedure: EXPLORATORY LAPAROTOMY;  Surgeon: Olean Ree, MD;  Location: [facility];  Service: General;  Laterality: N/A;  . TOOTH EXTRACTION      FAMILY HISTORY: Family History  Problem Relation Age of Onset  . Cancer Mother   . Breast cancer Mother 11  . Lung cancer Father   . Alcohol abuse Sister   . Bipolar disorder Sister   . Alcohol abuse Maternal Grandmother   . Stroke Maternal Grandmother   .  Stroke Paternal Grandmother     SOCIAL HISTORY: Social History   Socioeconomic History  . Marital status: Married    Spouse name: Not on file  . Number of children: 0  . Years of education: 36  . Highest education level: High school graduate  Occupational History  . Occupation: Homemaker  Tobacco Use  . Smoking status: Never Smoker  . Smokeless tobacco: Never Used  Vaping Use  . Vaping Use: Never used  Substance and Sexual Activity  . Alcohol use: Not Currently    Alcohol/week: 0.0 standard drinks  . Drug use: No  . Sexual activity: Yes    Birth control/protection: None  Other Topics Concern  . Not on file  Social History Narrative   Lives at home with her husband.   Right-handed.   Rare caffeine.   Social Determinants of Health   Financial Resource Strain:   . Difficulty of Paying Living Expenses: Not on file  Food Insecurity:   . Worried About Charity fundraiser in the Last Year: Not on file  . Ran Out of Food  in the Last Year: Not on file  Transportation Needs:   . Lack of Transportation (Medical): Not on file  . Lack of Transportation (Non-Medical): Not on file  Physical Activity:   . Days of Exercise per Week: Not on file  . Minutes of Exercise per Session: Not on file  Stress:   . Feeling of Stress : Not on file  Social Connections:   . Frequency of Communication with Friends and Family: Not on file  . Frequency of Social Gatherings with Friends and Family: Not on file  . Attends Religious Services: Not on file  . Active Member of Clubs or Organizations: Not on file  . Attends Archivist Meetings: Not on file  . Marital Status: Not on file  Intimate Partner Violence:   . Fear of Current or Ex-Partner: Not on file  . Emotionally Abused: Not on file  . Physically Abused: Not on file  . Sexually Abused: Not on file     PHYSICAL EXAM   Vitals:   12/13/19 1100  BP: 100/72  Pulse: 92  Weight: 110 lb 8 oz (50.1 kg)  Height: 4\' 11"  (1.499  m)   Not recorded     Body mass index is 22.32 kg/m.  PHYSICAL EXAMNIATION:  Gen: NAD, conversant, well nourised, well groomed                     Cardiovascular: Regular rate rhythm, no peripheral edema, warm, nontender. Eyes: Conjunctivae clear without exudates or hemorrhage Neck: Supple, no carotid bruits. Pulmonary: Clear to auscultation bilaterally   NEUROLOGICAL EXAM:  MENTAL STATUS: Speech:    Speech is normal; fluent and spontaneous with normal comprehension.  Cognition:     Orientation to time, place and person     Normal recent and remote memory     Normal Attention span and concentration     Normal Language, naming, repeating,spontaneous speech     Fund of knowledge   CRANIAL NERVES: CN II: Visual fields are full to confrontation. Pupils are round equal and briskly reactive to light. CN III, IV, VI: extraocular movement are normal. No ptosis. CN V: Facial sensation is intact to light touch CN VII: Face is symmetric with normal eye closure  CN VIII: Hearing is normal to causal conversation. CN IX, X: Phonation is normal. CN XI: Head turning and shoulder shrug are intact  MOTOR: There is no pronator drift of out-stretched arms. Muscle bulk and tone are normal. Muscle strength is normal.  REFLEXES: Reflexes are 2+ and symmetric at the biceps, triceps, knees, and ankles. Plantar responses are flexor.  SENSORY: Intact to light touch, pinprick and vibratory sensation are intact in fingers and toes.  COORDINATION: There is no trunk or limb dysmetria noted.  GAIT/STANCE: Steady,  Cautious   DIAGNOSTIC DATA (LABS, IMAGING, TESTING) - I reviewed patient records, labs, notes, testing and imaging myself where available.   ASSESSMENT AND PLAN  Tanya Nguyen is a 54 y.o. female   Bilateral feet and lower extremity paresthesia, restless  Differentiation diagnosis include peripheral neuropathy  EMG nerve conduction study  Lyrica 75 mg 3 times  daily     Marcial Pacas, M.D. Ph.D.  [facility] [address] [place], [place] [address] Ph: [phone] Fax: [phone]  CC:  Tanya Koch, NP [place],   [id number]

## 2019-12-14 ENCOUNTER — Other Ambulatory Visit: Payer: Self-pay | Admitting: Neurology

## 2019-12-14 DIAGNOSIS — F3130 Bipolar disorder, current episode depressed, mild or moderate severity, unspecified: Secondary | ICD-10-CM

## 2019-12-14 DIAGNOSIS — M722 Plantar fascial fibromatosis: Secondary | ICD-10-CM

## 2019-12-25 ENCOUNTER — Encounter: Payer: 59 | Admitting: Podiatry

## 2020-01-01 ENCOUNTER — Telehealth: Payer: Self-pay

## 2020-01-01 NOTE — Telephone Encounter (Signed)
Pt would like a refill for tramadol sent to [facility] [address]. Please advise.

## 2020-01-03 ENCOUNTER — Telehealth: Payer: Self-pay

## 2020-01-03 ENCOUNTER — Other Ambulatory Visit: Payer: Self-pay | Admitting: Podiatry

## 2020-01-03 NOTE — Telephone Encounter (Signed)
Patient called requesting a refill of Tramadol.  Please advise

## 2020-01-03 NOTE — Telephone Encounter (Signed)
Please advise 

## 2020-01-09 ENCOUNTER — Telehealth: Payer: Self-pay | Admitting: Neurology

## 2020-01-09 NOTE — Telephone Encounter (Signed)
I returned the call to the patient. She is having increased burning pain in her bilateral feet and up into her legs. She is currently taking duloxetine 60mg , one capsule daily and pregabalin 75, one capsule TID. She has NCV/EMG scheduled on 01/21/20. She would like to know if any other medication can be added to help with her symptoms.

## 2020-01-09 NOTE — Telephone Encounter (Signed)
Pt is requesting for medication for sever leg pain.  Pt states this is the worse leg pain she has ever had to ensure.  Pt is asking [id number]

## 2020-01-09 NOTE — Telephone Encounter (Addendum)
I returned the call to the patient. I notified her of Dr. Rhea Belton directions to increase Lyrica 75mg  and she correctly repeated the instructions back to me. She has enough medication at home to try this new dosage without needing a new prescription. She is aware Dr. Krista Blue will further evaluate the pain at her pending NCV/EMG on 01/21/20.

## 2020-01-09 NOTE — Telephone Encounter (Signed)
She may take higher dose of Lyrica, 75 mg 2 tablets 3 times a day,  At next visit for EMG nerve conduction study in 2 weeks, we can discuss other medication choices

## 2020-01-19 ENCOUNTER — Other Ambulatory Visit: Payer: Self-pay | Admitting: Primary Care

## 2020-01-19 DIAGNOSIS — J309 Allergic rhinitis, unspecified: Secondary | ICD-10-CM

## 2020-01-20 ENCOUNTER — Other Ambulatory Visit: Payer: Self-pay | Admitting: Primary Care

## 2020-01-20 DIAGNOSIS — G609 Hereditary and idiopathic neuropathy, unspecified: Secondary | ICD-10-CM

## 2020-01-21 ENCOUNTER — Ambulatory Visit (INDEPENDENT_AMBULATORY_CARE_PROVIDER_SITE_OTHER): Payer: 59 | Admitting: Neurology

## 2020-01-21 ENCOUNTER — Encounter (INDEPENDENT_AMBULATORY_CARE_PROVIDER_SITE_OTHER): Payer: 59 | Admitting: Neurology

## 2020-01-21 DIAGNOSIS — R202 Paresthesia of skin: Secondary | ICD-10-CM

## 2020-01-21 DIAGNOSIS — M722 Plantar fascial fibromatosis: Secondary | ICD-10-CM

## 2020-01-21 DIAGNOSIS — F3130 Bipolar disorder, current episode depressed, mild or moderate severity, unspecified: Secondary | ICD-10-CM

## 2020-01-21 DIAGNOSIS — Z0289 Encounter for other administrative examinations: Secondary | ICD-10-CM

## 2020-01-21 NOTE — Procedures (Signed)
Full Name: Tanya Nguyen Gender: Female MRN #: [id number] Date of Birth: 01-17-1966    Visit Date: 01/21/2020 09:06 Age: 54 Years Examining Physician: Marcial Pacas, MD  Referring Physician: Marcial Pacas, MD Height: 4 feet 11 inch Patient History: 54 year old female presented with diffuse body achy pain, bilateral feet paresthesia  Summary of the test:  Nerve conduction study:  Bilateral sural, peroneal sensory responses were normal.  Bilateral peroneal to EDB and tibial motor responses were normal.  Electromyography: Selected needle examination of right lower extremity muscles and right lumbosacral paraspinal muscles were normal.  Conclusion: This is a normal study.  There is no electrodiagnostic evidence of large fiber peripheral neuropathy or right lumbosacral radiculopathy.    ------------------------------- Marcial Pacas, M.D. PhD  [facility] [address], [place] [address] Tel: [phone] Fax: [phone]  Verbal informed consent was obtained from the patient, patient was informed of potential risk of procedure, including bruising, bleeding, hematoma formation, infection, muscle weakness, muscle pain, numbness, among others.         [place]    Nerve / Sites Muscle Latency Ref. Amplitude Ref. Rel Amp Segments Distance Velocity Ref. Area    ms ms mV mV %  cm m/s m/s mVms  L Peroneal - EDB     Ankle EDB 4.2 ?6.5 4.8 ?2.0 100 Ankle - EDB 9   17.5     Fib head EDB 9.0  4.5  93.4 Fib head - Ankle 24 51 ?44 18.4     Pop fossa EDB 11.1  4.4  98.5 Pop fossa - Fib head 10 46 ?44 19.6         Pop fossa - Ankle      R Peroneal - EDB     Ankle EDB 5.1 ?6.5 5.5 ?2.0 100 Ankle - EDB 9   19.7     Fib head EDB 10.0  6.1  111 Fib head - Ankle 25 51 ?44 19.0     Pop fossa EDB 12.0  5.6  92.5 Pop fossa - Fib head 10 49 ?44 18.9         Pop fossa - Ankle      L Tibial - AH     Ankle AH 4.3 ?5.8 15.2 ?4.0 100 Ankle - AH 9   26.2     Pop fossa AH 12.1   11.7  76.6 Pop fossa - Ankle 34 44 ?41 24.1  R Tibial - AH     Ankle AH 4.1 ?5.8 13.0 ?4.0 100 Ankle - AH 9   25.6     Pop fossa AH 12.4  11.0  84.3 Pop fossa - Ankle 35 42 ?41 27.7             SNC    Nerve / Sites Rec. Site Peak Lat Ref.  Amp Ref. Segments Distance    ms ms V V  cm  L Sural - Ankle (Calf)     Calf Ankle 3.9 ?4.4 7 ?6 Calf - Ankle 14  R Sural - Ankle (Calf)     Calf Ankle 3.7 ?4.4 15 ?6 Calf - Ankle 14  L Superficial peroneal - Ankle     Lat leg Ankle 3.5 ?4.4 12 ?6 Lat leg - Ankle 14  R Superficial peroneal - Ankle     Lat leg Ankle 3.7 ?4.4 9 ?6 Lat leg - Ankle 14             F  Wave    Nerve F Lat Ref.   ms ms  L Tibial - AH 45.9 ?56.0  R Tibial - AH 47.1 ?56.0         EMG Summary Table    Spontaneous MUAP Recruitment  Muscle IA Fib PSW Fasc Other Amp Dur. Poly Pattern  R. Tibialis anterior Normal None None None _______ Normal Normal Normal Normal  R. Tibialis posterior Normal None None None _______ Normal Normal Normal Normal  R. Peroneus longus Normal None None None _______ Normal Normal Normal Normal  R. Gastrocnemius (Medial head) Normal None None None _______ Normal Normal Normal Normal  R. Vastus lateralis Normal None None None _______ Normal Normal Normal Normal  R. Lumbar paraspinals (low) Normal None None None _______ Normal Normal Normal Normal  R. Lumbar paraspinals (mid) Normal None None None _______ Normal Normal Normal Normal

## 2020-01-22 NOTE — Telephone Encounter (Signed)
Returning a phone call

## 2020-01-22 NOTE — Telephone Encounter (Signed)
Left message to return call to our office.  

## 2020-01-22 NOTE — Telephone Encounter (Signed)
Refill sent to pharmacy.   

## 2020-01-25 ENCOUNTER — Other Ambulatory Visit: Payer: Self-pay

## 2020-01-25 ENCOUNTER — Ambulatory Visit (INDEPENDENT_AMBULATORY_CARE_PROVIDER_SITE_OTHER): Payer: 59 | Admitting: Podiatry

## 2020-01-25 ENCOUNTER — Encounter: Payer: Self-pay | Admitting: Podiatry

## 2020-01-25 DIAGNOSIS — M722 Plantar fascial fibromatosis: Secondary | ICD-10-CM | POA: Diagnosis not present

## 2020-01-25 DIAGNOSIS — G609 Hereditary and idiopathic neuropathy, unspecified: Secondary | ICD-10-CM | POA: Diagnosis not present

## 2020-01-25 MED ORDER — TRAMADOL HCL 50 MG PO TABS: 50.0000 mg | ORAL_TABLET | Freq: Four times a day (QID) | ORAL | 0 refills | Status: AC | PRN

## 2020-01-25 NOTE — Progress Notes (Signed)
Neuro  Subjective: 54 year old female presenting today for follow up evaluation of bilateral plantar fasciitis and idiopathic peripheral neuropathy.  Patient continues to have bilateral lower extremity severe pain.  Patient states that the neurologist increased her dosage of Lyrica.  She currently is not taking any Flexeril.  She is taking Cymbalta from her PCP.  She is very frustrated because the amount of pain that she is in and there has been no definitive diagnosis or alleviation of her symptoms.   Past Medical History:  Diagnosis Date  . Acquired pyloric stricture   . Bleeding duodenal ulcer   . Chronic esophagogastric ulcer   . Depression   . Duodenal obstruction   . Gastric outlet obstruction 01/19/2018  . GERD (gastroesophageal reflux disease)   . History of stomach ulcers   . Hypothyroidism   . Incisional hernia, without obstruction or gangrene   . Intractable vomiting   . Kidney stones   . Migraine   . Migraine headache    only1-2x/month since starting topamax  . Neuropathy   . Side pain 03/30/2018     Objective: Physical Exam General: The patient is alert and oriented x3 in no acute distress.  Dermatology: Skin is warm, dry and supple bilateral lower extremities. Negative for open lesions or macerations bilateral.   Vascular: Dorsalis Pedis and Posterior Tibial pulses palpable bilateral.  Capillary fill time is immediate to all digits.  Neurological: Epicritic and protective threshold intact bilateral.   Musculoskeletal: Tenderness to palpation to the plantar aspect of the bilateral heels along the plantar fascia. All other joints range of motion within normal limits bilateral. Strength 5/5 in all groups bilateral.   Assessment: 1. plantar fasciitis bilateral feet  2. Idiopathic peripheral neuropathy bilateral   Plan of Care:  1. Patient evaluated.  2. Injection of 0.5cc Celestone soluspan injected into the bilateral heels.  3.  Patient is currently taking  Lyrica from neurology.  Continue Lyrica as per neurology 4.  Prescription for tramadol 50 mg every 6 hours as needed pain 5.  The patient is in chronic bilateral lower extremity severe pain.  At this point we are going to refer her to chronic pain management to see if they can help to assess and manage her pain and give her more definitive diagnosis.  The patient does have some moderate plantar fascial heel pain, however this is completely different and unassociated with the pain that she experiences to the bilateral legs that extend all the way up past the knees. 6.  Return to clinic as needed  Quit working for [employer]. Currently caring for mother.    Edrick Kins, DPM [facility]  Dr. Edrick Kins, DPM    [address], [place] [address]                Office [phone]  Fax [phone]

## 2020-01-28 ENCOUNTER — Other Ambulatory Visit: Payer: Self-pay | Admitting: Neurology

## 2020-01-28 NOTE — Telephone Encounter (Signed)
It is Ok to taker high dose of Lyrica if she can tolerate the side effect.

## 2020-01-28 NOTE — Telephone Encounter (Addendum)
Called patient who stated she's taking lyrica 2 tabs in am, 2 tabs at noon, 3 tabs at bedtime. She stated her duloxetine was lowered to 30 mg by PCP. I advised her per Dr Rhea Belton note, she is supposed to take Lyrica two tabs three x daily.  I advised will let Dr Earnest Bailey know how much Lyrica and duloxetine she is taking. Will call her back with Dr Rhea Belton instructions. Patient verbalized understanding, appreciation.

## 2020-01-28 NOTE — Telephone Encounter (Signed)
Pt. is requesting pregabalin (LYRICA) 75 MG capsule to be filled.   Pharmacy: [facility]

## 2020-01-28 NOTE — Telephone Encounter (Signed)
Called patient who stated she is doing fine on current doses of Lyrica. She needs new Rx to reflect new doses. Oct Rx will have her run out of medication. I advised will let Dr Krista Blue know to send in new Rx of Lyrica. Patient verbalized understanding, appreciation.

## 2020-01-29 MED ORDER — PREGABALIN 75 MG PO CAPS: ORAL_CAPSULE | ORAL | 4 refills | Status: DC

## 2020-01-29 NOTE — Addendum Note (Signed)
Addended by: Marcial Pacas on: 01/29/2020 09:54 AM   Modules accepted: Orders

## 2020-01-29 NOTE — Telephone Encounter (Signed)
Meds ordered this encounter  Medications  . pregabalin (LYRICA) 75 MG capsule    Sig: 2 tabs twice a day 3 tabs every night    Dispense:  450 capsule    Refill:  4

## 2020-02-08 ENCOUNTER — Telehealth: Payer: Self-pay

## 2020-02-08 NOTE — Telephone Encounter (Signed)
I spoke with pt; pt is at [facility] now. FYI to Gentry Fitz NP.

## 2020-02-08 NOTE — Telephone Encounter (Signed)
Noted. Will await notes.  

## 2020-02-08 NOTE — Telephone Encounter (Signed)
[facility] Day - Client TELEPHONE ADVICE RECORD [facility] Patient Name: Tanya Nguyen Gender: Female DOB: 11-Sep-1965 Age: 55 Y 65 M 13 D Return Phone Number: [phone] (Primary), [phone] (Secondary) Address: [address] City/State/Zip: [place] [address] Client [place] Day - Client Client Site [facility] - Day Physician Alma Friendly - NP Contact Type Call Who Is Calling Patient / Member / Family / Caregiver Call Type Triage / Clinical Relationship To Patient Self Return Phone Number [phone] (Primary) Chief Complaint CHEST PAIN (>=21 years) - pain, pressure, heaviness or tightness Reason for Call Symptomatic / Request for [place] states she's having chest pain, cough. Throat burning. Translation No Nurse Assessment Nurse: Alveta Heimlich, RN, Rise Paganini Date/Time (Eastern Time): 02/08/2020 10:42:41 AM Confirm and document reason for call. If symptomatic, describe symptoms. ---[name] states she is having a cough with her chest hurting. It is worse at night. The cough started over the weekend . No fever. Her chest started hurting Saturday. Her chest is hurting a little now, hurts worse when coughing. Has had runny nose also. Throat is burning and down in chest when she coughs. Does the patient have any new or worsening symptoms? ---Yes Will a triage be completed? ---Yes Related visit to physician within the last 2 weeks? ---No Does the PT have any chronic conditions? (i.e. diabetes, asthma, this includes High risk factors for pregnancy, etc.) ---Yes List chronic conditions. ---plantar faciitis, neurophathy Is this a behavioral health or substance abuse call? ---No Guidelines Guideline Title Affirmed Question Affirmed Notes Nurse Date/Time ([place] Time) COVID-19 - Diagnosed or Suspected Patient sounds very sick or weak to the triager Alveta Heimlich, RN, [facility]  02/08/2020 10:47:53 AM Disp. Time Eilene Ghazi Time) Disposition Final User 02/08/2020 10:36:50 AM Send to Urgent Queue Baruch Goldmann 02/08/2020 10:51:58 AM Go to ED Now (or PCP triage) Yes Alveta Heimlich, RN, Rise Paganini PLEASE NOTE: All timestamps contained within this report are represented as [place] Standard Time. CONFIDENTIALTY NOTICE: This fax transmission is intended only for the addressee. It contains information that is legally privileged, confidential or otherwise protected from use or disclosure. If you are not the intended recipient, you are strictly prohibited from reviewing, disclosing, copying using or disseminating any of this information or taking any action in reliance on or regarding this information. If you have received this fax in error, please notify us immediately by telephone so that we can arrange for its return to [place]. Phone: [phone], Toll-Free: [phone], Fax: [phone] Page: 2 of 2 Call Id: [id number] [place] Disagree/Comply Comply [name] Understands Yes PreDisposition Call Doctor Care Advice Given Per Guideline GO TO ED NOW (OR PCP TRIAGE): * IF NO PCP (PRIMARY CARE PROVIDER) SECOND-LEVEL TRIAGE: You need to be seen within the next hour. Go to the [facility] at _____________ [place] as soon as you can. CARE ADVICE given per COVID-19 - DIAGNOSED OR SUSPECTED (Adult) guideline. Comments User: Baruch Goldmann Date/Time Eilene Ghazi Time): 02/08/2020 10:35:57 AM [name] is transferred from the office. User: Debby Bud, RN Date/Time Eilene Ghazi Time): 02/08/2020 10:53:59 AM She says there are 2 urgent cares that she has used and not sure which one she will go to yet. Referrals GO TO FACILITY UNDECIDED

## 2020-02-11 ENCOUNTER — Telehealth: Payer: Self-pay | Admitting: Primary Care

## 2020-02-11 NOTE — Telephone Encounter (Signed)
I do not know what she's referring to.

## 2020-02-11 NOTE — Telephone Encounter (Signed)
PT CALLED IN WANTED TO KNOW ABOUT PAPERWORK FOR DISABILITY.  SHE HAS BEEN WAITING A WHILE FOR THEM.

## 2020-02-11 NOTE — Telephone Encounter (Signed)
Do you know anything about ppw?

## 2020-02-12 ENCOUNTER — Other Ambulatory Visit: Payer: Self-pay

## 2020-02-12 ENCOUNTER — Encounter: Payer: Self-pay | Admitting: Primary Care

## 2020-02-12 ENCOUNTER — Telehealth (INDEPENDENT_AMBULATORY_CARE_PROVIDER_SITE_OTHER): Payer: 59 | Admitting: Primary Care

## 2020-02-12 DIAGNOSIS — M79676 Pain in unspecified toe(s): Secondary | ICD-10-CM

## 2020-02-12 DIAGNOSIS — J189 Pneumonia, unspecified organism: Secondary | ICD-10-CM | POA: Insufficient documentation

## 2020-02-12 HISTORY — DX: Pneumonia, unspecified organism: J18.9

## 2020-02-12 NOTE — Telephone Encounter (Signed)
This request was sent to [facility] in September. I called and gave patient the number to call to check on status of these records.

## 2020-02-12 NOTE — Assessment & Plan Note (Signed)
Diagnosed on 12/03 at [facility]. Compliant to [facility], will finish in two days.  She sounds well today, no cough noted during visit.  Discussed to return in 3-4 weeks for repeat chest xray to ensure resolve.

## 2020-02-12 NOTE — Addendum Note (Signed)
Addended by: Pleas Koch on: 02/12/2020 11:38 AM   Modules accepted: Orders

## 2020-02-12 NOTE — Progress Notes (Signed)
Subjective:    Patient ID: Tanya Nguyen, female    DOB: January 12, 1966, 54 y.o.   MRN: [id number]  HPI  This visit occurred during the SARS-CoV-2 public health emergency.  Safety protocols were in place, including screening questions prior to the visit, additional usage of staff PPE, and extensive cleaning of exam room while observing appropriate contact time as indicated for disinfecting solutions.      Tanya Nguyen - 54 y.o. female  MRN [id number]  Date of Birth: [birth year]  PCP: Pleas Koch, NP  This service was provided via telemedicine. Phone Visit performed on 02/12/2020    Rationale for phone visit along with limitations reviewed. I discussed the limitations, risks, security and privacy concerns of performing a phone visit and the availability of in person appointments. I also discussed with the patient that there may be a patient responsible charge related to this service. Patient consented to telephone encounter.    Location of patient: Home Location of provider: Office at [employer] [facility] Name of referring provider: N/A   Names of persons and role in encounter: Provider: Pleas Koch, NP  Patient: Tanya Nguyen  Participants: Patient and myself   Time on call: 8 min - 30 sec   Subjective: Chief Complaint  Patient presents with  . Follow-up    seen at Urgent care a week ago      HPI:  Tanya Nguyen is a 54 year old female with a history of migraines, GERD, hypothyroidism, PTSD, fatigue who presents today for urgent care follow up: Pneumonia and right upper extremity pain.  Evaluated at [facility] on 02/08/20 for symptoms of cough/congestion that began on 01/26/20. Associated symptoms of fatigue, generalized weakness, nausea. No loss of taste/smell, diarrhea. Lab testing negative for Covid-19. Chest xray with right sided infiltrate. Treated for CAP with Levaquin and promethazine/dextromethorphan. She was told to have repeat chest xray in 4-6 weeks.    Also evaluated for acute right upper extremity pain, tripped and fell while getting something out of her care while at [facility] the day prior. Landed on her right side. She underwent plain films of the right upper extremity, negative for acute bony injury.  Today she's feeling better. She is compliant to her Levaquin, is utilizing the cough syrup with improvement.  Her right upper extremity pain has improved. She denies fevers.    Objective/Observations:  No physical exam or vital signs collected unless specifically identified below.   Ht 4\' 11"  (1.499 m)   Wt 110 lb (49.9 kg)   BMI 22.22 kg/m    Respiratory status: speaks in complete sentences without evident shortness of breath.   Assessment/Plan:  See problem based charting.  I discussed the assessment and treatment plan with the patient. The patient was provided an opportunity to ask questions and all were answered. The patient agreed with the plan and demonstrated an understanding of the instructions.  Lab Orders  No laboratory test(s) ordered today    No orders of the defined types were placed in this encounter.   The patient was advised to call back or seek an in-person evaluation if the symptoms worsen or if the condition fails to improve as anticipated.  Pleas Koch, NP    Review of Systems  Constitutional: Positive for fatigue. Negative for fever.  HENT: Positive for congestion. Negative for sore throat.   Respiratory: Positive for cough. Negative for shortness of breath.        Past Medical History:  Diagnosis Date  .  Acquired pyloric stricture   . Bleeding duodenal ulcer   . Chronic esophagogastric ulcer   . Depression   . Duodenal obstruction   . Gastric outlet obstruction 01/19/2018  . GERD (gastroesophageal reflux disease)   . History of stomach ulcers   . Hypothyroidism   . Incisional hernia, without obstruction or gangrene   . Intractable vomiting   . Kidney stones   . Migraine   .  Migraine headache    only1-2x/month since starting topamax  . Neuropathy   . Side pain 03/30/2018     Social History   Socioeconomic History  . Marital status: Married    Spouse name: Not on file  . Number of children: 0  . Years of education: 23  . Highest education level: High school graduate  Occupational History  . Occupation: Homemaker  Tobacco Use  . Smoking status: Never Smoker  . Smokeless tobacco: Never Used  Vaping Use  . Vaping Use: Never used  Substance and Sexual Activity  . Alcohol use: Not Currently    Alcohol/week: 0.0 standard drinks  . Drug use: No  . Sexual activity: Yes    Birth control/protection: None  Other Topics Concern  . Not on file  Social History Narrative   Lives at home with her husband.   Right-handed.   Rare caffeine.   Social Determinants of Health   Financial Resource Strain:   . Difficulty of Paying Living Expenses: Not on file  Food Insecurity:   . Worried About Charity fundraiser in the Last Year: Not on file  . Ran Out of Food in the Last Year: Not on file  Transportation Needs:   . Lack of Transportation (Medical): Not on file  . Lack of Transportation (Non-Medical): Not on file  Physical Activity:   . Days of Exercise per Week: Not on file  . Minutes of Exercise per Session: Not on file  Stress:   . Feeling of Stress : Not on file  Social Connections:   . Frequency of Communication with Friends and Family: Not on file  . Frequency of Social Gatherings with Friends and Family: Not on file  . Attends Religious Services: Not on file  . Active Member of Clubs or Organizations: Not on file  . Attends Archivist Meetings: Not on file  . Marital Status: Not on file  Intimate Partner Violence:   . Fear of Current or Ex-Partner: Not on file  . Emotionally Abused: Not on file  . Physically Abused: Not on file  . Sexually Abused: Not on file    Past Surgical History:  Procedure Laterality Date  . ABDOMINAL  HYSTERECTOMY  2004   partial  . CHOLECYSTECTOMY    . COLONOSCOPY WITH PROPOFOL N/A 02/27/2018   Procedure: COLONOSCOPY WITH PROPOFOL;  Surgeon: [name], MD;  Location: [place];  Service: Endoscopy;  Laterality: N/A;  . ESOPHAGOGASTRODUODENOSCOPY (EGD) WITH PROPOFOL N/A 01/12/2018   Procedure: ESOPHAGOGASTRODUODENOSCOPY (EGD) WITH BIOPSIES;  Surgeon: [name], MD;  Location: [place];  Service: Endoscopy;  Laterality: N/A;  . ESOPHAGOGASTRODUODENOSCOPY (EGD) WITH PROPOFOL N/A 01/25/2018   Procedure: ESOPHAGOGASTRODUODENOSCOPY (EGD) WITH PROPOFOL;  Surgeon: Jonathon Bellows, MD;  Location: [facility] ENDOSCOPY;  Service: Gastroenterology;  Laterality: N/A;  . ESOPHAGOGASTRODUODENOSCOPY (EGD) WITH PROPOFOL N/A 02/27/2018   Procedure: ESOPHAGOGASTRODUODENOSCOPY (EGD) WITH PROPOFOL;  Surgeon: [name], MD;  Location: [place];  Service: Endoscopy;  Laterality: N/A;  . INCISIONAL HERNIA REPAIR N/A 11/23/2018   Procedure: HERNIA REPAIR  INCISIONAL;  Surgeon: Olean Ree, MD;  Location: [facility];  Service: General;  Laterality: N/A;  . INSERTION OF MESH N/A 11/23/2018   Procedure: INSERTION OF MESH;  Surgeon: Olean Ree, MD;  Location: [facility];  Service: General;  Laterality: N/A;  . LAPAROTOMY N/A 01/20/2018   Procedure: EXPLORATORY LAPAROTOMY;  Surgeon: Olean Ree, MD;  Location: [facility];  Service: General;  Laterality: N/A;  . TOOTH EXTRACTION      Family History  Problem Relation Age of Onset  . Cancer Mother   . Breast cancer Mother 36  . Lung cancer Father   . Alcohol abuse Sister   . Bipolar disorder Sister   . Alcohol abuse Maternal Grandmother   . Stroke Maternal Grandmother   . Stroke Paternal Grandmother     Allergies  Allergen Reactions  . Penicillins Shortness Of Breath, Diarrhea and Nausea And Vomiting    Has patient had a PCN reaction causing immediate rash, facial/tongue/throat swelling, SOB or lightheadedness with hypotension:  yes Has patient had a PCN reaction causing severe rash involving mucus membranes or skin necrosis: no Has patient had a PCN reaction that required hospitalization no Has patient had a PCN reaction occurring within the last 10 years: about 10 years If all of the above answers are "NO", then may proceed with Cephalosporin use.   . Gabapentin     Hair loss  . Sulfa Antibiotics Nausea And Vomiting    Other reaction(s): VOMITING    Current Outpatient Medications on File Prior to Visit  Medication Sig Dispense Refill  . cetirizine (ZYRTEC) 10 MG tablet TAKE 1 TABLET BY MOUTH EVERY DAY AS NEEDED FOR ALLERGY 90 tablet 1  . cyclobenzaprine (FLEXERIL) 5 MG tablet TAKE 1/2 TO 1 TABLET (2.5-5 MG TOTAL) BY MOUTH DAILY AS NEEDED (FOOT PAIN). 30 tablet 0  . DULoxetine (CYMBALTA) 30 MG capsule Take 30 mg by mouth daily.    . DULoxetine (CYMBALTA) 60 MG capsule TAKE 1 CAPSULE (60 MG TOTAL) BY MOUTH DAILY. FOR PAIN AND ANXIETY. 90 capsule 4  . fluticasone (FLONASE) 50 MCG/ACT nasal spray Place 1 spray into both nostrils 2 (two) times daily. 16 g 0  . levofloxacin (LEVAQUIN) 500 MG tablet Take by mouth.    . levothyroxine (SYNTHROID) 88 MCG tablet TAKE 1 TABLET BY MOUTH EVERY MORNING ON EMPTY STOMACH WITH WATER ONLY-NO FOOD OR OTHER MED X 30 MIN 90 tablet 3  . pantoprazole (PROTONIX) 40 MG tablet Take 40 mg by mouth daily.    . pregabalin (LYRICA) 75 MG capsule 2 tabs twice a day 3 tabs every night 450 capsule 4  . promethazine-dextromethorphan (PROMETHAZINE-DM) 6.25-15 MG/5ML syrup Take by mouth.    . sertraline (ZOLOFT) 50 MG tablet     . SUMAtriptan (IMITREX) 25 MG tablet May repeat in 2 hours if headache persists or recurs. 9 tablet 0  . topiramate (TOPAMAX) 50 MG tablet Take 1 tablet (50 mg total) by mouth at bedtime. For migraine prevention. 90 tablet 1  . traMADol (ULTRAM) 50 MG tablet     . traZODone (DESYREL) 50 MG tablet Take 3 tablets (150 mg total) by mouth at bedtime. 270 tablet 1   No  current facility-administered medications on file prior to visit.    Ht 4\' 11"  (1.499 m)   Wt 110 lb (49.9 kg)   BMI 22.22 kg/m    Objective:   Physical Exam Constitutional:      General: She is not in acute distress. Pulmonary:  Effort: Pulmonary effort is normal.     Comments: No cough during phone visit. Neurological:     Mental Status: She is alert and oriented to person, place, and time.  Psychiatric:        Mood and Affect: Mood normal.            Assessment & Plan:

## 2020-02-12 NOTE — Telephone Encounter (Signed)
Looks like this was a release that patient filled out to have notes sent to her. Do we have a number we can give her to call to check on that?

## 2020-02-12 NOTE — Patient Instructions (Signed)
Continue taking the Levaquin antibiotic.  Use the cough syrup as needed.   Please come back in 3-4 weeks to repeat your chest xray.   It was a pleasure to see you today!

## 2020-02-12 NOTE — Telephone Encounter (Signed)
We are not aware of any ppw are you?

## 2020-02-13 ENCOUNTER — Other Ambulatory Visit: Payer: Self-pay | Admitting: Primary Care

## 2020-02-13 DIAGNOSIS — G43001 Migraine without aura, not intractable, with status migrainosus: Secondary | ICD-10-CM

## 2020-02-14 ENCOUNTER — Telehealth: Payer: Self-pay

## 2020-02-14 NOTE — Telephone Encounter (Signed)
Disability determination services requested records from 02/05/2018 to current. Faxed this request to them at [phone]

## 2020-02-15 ENCOUNTER — Telehealth: Payer: Self-pay

## 2020-02-15 DIAGNOSIS — E038 Other specified hypothyroidism: Secondary | ICD-10-CM

## 2020-02-15 NOTE — Telephone Encounter (Signed)
Noted  

## 2020-02-15 NOTE — Telephone Encounter (Signed)
Pt had 02/12/20 video visit and forgot to mention to Gentry Fitz NP since having pneumonia she has had jitteriness and shaking inside but is worse today. Pt  Still has dry cough, chills, sore muscles, H/A, watery diarrhea couple of times in last few days; S/T and pt is concerned about the worsening shakiness inside. Pts husband will take pt to [facility] now.FYI to Gentry Fitz  NP.

## 2020-02-18 NOTE — Telephone Encounter (Signed)
Left message to return call to our office.  

## 2020-02-18 NOTE — Telephone Encounter (Signed)
Please advise 

## 2020-02-18 NOTE — Telephone Encounter (Signed)
Pt called in and stated that she went to [facility] clinic and they saw that her thyriod levels are high and she stated that they sent her message.  Wanted to know what are her next steps.

## 2020-02-18 NOTE — Addendum Note (Signed)
Addended by: Pleas Koch on: 02/18/2020 01:30 PM   Modules accepted: Orders

## 2020-02-18 NOTE — Telephone Encounter (Signed)
I recommend we start with repeating her thyroid levels first. Lab appointment is fine.  Also, please verify that she is taking levothyroxine correctly.  Be sure to take your levothyroxine (thyroid medication) every morning on an empty stomach with water only. No food or other medications for 30 minutes. No heartburn medication, iron pills, calcium, vitamin D, or magnesium pills within four hours of taking levothyroxine.

## 2020-02-19 NOTE — Telephone Encounter (Signed)
Called patient she is taking as directed. Will come tomorrow to recheck labs.

## 2020-02-19 NOTE — Telephone Encounter (Signed)
Pt called returning a call  °

## 2020-02-20 ENCOUNTER — Other Ambulatory Visit: Payer: 59

## 2020-02-21 ENCOUNTER — Other Ambulatory Visit: Payer: Self-pay

## 2020-02-21 ENCOUNTER — Other Ambulatory Visit (INDEPENDENT_AMBULATORY_CARE_PROVIDER_SITE_OTHER): Payer: 59

## 2020-02-21 DIAGNOSIS — E038 Other specified hypothyroidism: Secondary | ICD-10-CM | POA: Diagnosis not present

## 2020-02-21 LAB — TSH: TSH: 9.1 u[IU]/mL — ABNORMAL HIGH (ref 0.35–4.50)

## 2020-02-25 ENCOUNTER — Encounter: Payer: Self-pay | Admitting: Primary Care

## 2020-03-04 ENCOUNTER — Telehealth: Payer: Self-pay

## 2020-03-04 NOTE — Telephone Encounter (Signed)
Patient called requesting a refill of her Tramadol.  She is seeing pain clinic and does not go back until sometime in January.  Please advise if you want to refill   Thanks

## 2020-03-05 ENCOUNTER — Telehealth: Payer: Self-pay | Admitting: *Deleted

## 2020-03-05 NOTE — Telephone Encounter (Signed)
"  I called yesterday morning and left a message for the nurse.  I haven't heard anything back.  I need a refill on my pain medicine until I go see pain management on 03/13/2019.  My foot is killing me but I can't deal with this pain until then.  I'm working on concrete floors at [employer] so my feet are hurting more."  I'll send another message to Dr. Logan Bores.  He has not responded at this time.

## 2020-03-11 ENCOUNTER — Other Ambulatory Visit: Payer: 59

## 2020-03-13 ENCOUNTER — Other Ambulatory Visit: Payer: Self-pay

## 2020-03-13 ENCOUNTER — Encounter: Payer: Self-pay | Admitting: Student in an Organized Health Care Education/Training Program

## 2020-03-13 ENCOUNTER — Ambulatory Visit
Payer: 59 | Attending: Student in an Organized Health Care Education/Training Program | Admitting: Student in an Organized Health Care Education/Training Program

## 2020-03-13 VITALS — BP 116/75 | HR 91 | Temp 97.3°F | Resp 16 | Ht 59.0 in | Wt 105.0 lb

## 2020-03-13 DIAGNOSIS — M722 Plantar fascial fibromatosis: Secondary | ICD-10-CM | POA: Insufficient documentation

## 2020-03-13 DIAGNOSIS — F3130 Bipolar disorder, current episode depressed, mild or moderate severity, unspecified: Secondary | ICD-10-CM | POA: Diagnosis present

## 2020-03-13 DIAGNOSIS — M79672 Pain in left foot: Secondary | ICD-10-CM

## 2020-03-13 DIAGNOSIS — G894 Chronic pain syndrome: Secondary | ICD-10-CM | POA: Insufficient documentation

## 2020-03-13 DIAGNOSIS — M79671 Pain in right foot: Secondary | ICD-10-CM | POA: Insufficient documentation

## 2020-03-13 DIAGNOSIS — G609 Hereditary and idiopathic neuropathy, unspecified: Secondary | ICD-10-CM | POA: Insufficient documentation

## 2020-03-13 DIAGNOSIS — F4310 Post-traumatic stress disorder, unspecified: Secondary | ICD-10-CM | POA: Insufficient documentation

## 2020-03-13 NOTE — Progress Notes (Signed)
Patient: Tanya Nguyen  Service Category: E/M  Provider: Gillis Santa, MD  DOB: February 02, 1966  DOS: 03/13/2020  Referring Provider: Edrick Kins DPM  MRN: [id number]  Setting: Ambulatory outpatient  PCP: Pleas Koch, NP  Type: New Patient  Specialty: Interventional Pain Management    Location: Office  Delivery: Face-to-face     Primary Reason(s) for Visit: Encounter for initial evaluation of one or more chronic problems (new to examiner) potentially causing chronic pain, and posing a threat to normal musculoskeletal function. (Level of risk: High) CC: Foot Pain (bilateral)  HPI  Tanya Nguyen is a 55 y.o. year old, female patient, who comes for the first time to our practice referred by Edrick Kins, DPM for our initial evaluation of her chronic pain. She has GERD (gastroesophageal reflux disease); Migraines; Hypothyroidism; Insomnia; Fatigue; Constipation; Dysuria; PTSD (post-traumatic stress disorder); Bipolar I disorder, most recent episode depressed ([place]); Preventative health care; Plantar fascial fibromatosis of both feet; Vitamin D deficiency; Vitamin B 12 deficiency; Chronic low back pain; Abdominal pain, generalized; Protein-calorie malnutrition, severe; Urge incontinence; Sacroiliac joint pain; Hair loss; Idiopathic peripheral neuropathy; Paresthesia; CAP (community acquired pneumonia); Bilateral foot pain; and Chronic pain syndrome on their problem list. Today she comes in for evaluation of her Foot Pain (bilateral)  Pain Assessment: Location: Right,Left Foot Radiating: ankles Onset: More than a month ago Duration: Chronic pain Quality: Burning,Tingling,Constant Severity: 10-Worst pain ever/10 (subjective, self-reported pain score)  Effect on ADL: difficulty performing daily activities Timing: Constant Modifying factors: medications BP: 116/75  HR: 91  Onset and Duration: Gradual and Present longer than 3 months Cause of pain: Unknown Severity: Getting worse, NAS-11 at its worse:  10/10, NAS-11 at its best: 8/10, NAS-11 now: 10/10 and NAS-11 on the average: 8/10 Timing: Night, During activity or exercise, After activity or exercise and After a period of immobility Aggravating Factors: Bending, Squatting and Walking Alleviating Factors: Medications Associated Problems: Numbness and Tingling Quality of Pain: Burning and Constant Previous Examinations or Tests: X-rays and Nerve conduction test Previous Treatments: Narcotic medications, "cortisone" injections  Tanya Nguyen is a pleasant 55 year old female who presents with a chief complaint of bilateral foot pain related to plantar fasciitis and idiopathic peripheral neuropathy.  She describes her pain as burning and tingling has been chronic in nature.  She has been followed by podiatry in the past and has received steroid injections in both of her feet, bilateral heels.  She has been referred by Dr. Amalia Hailey for medication management.  Patient is on tramadol and takes it 50 mg once or twice daily as needed.  She is also on Lyrica 75 mg twice a day and 225 mg nightly.  This is being managed by her primary care provider.  She is also on Cymbalta 30 mg as well as Zoloft 50 mg.  No symptoms concerning for serotonin syndrome such as agitation, nausea, diaphoresis, tachycardia, palpitations..  She would like to continue her tramadol and is requesting for it to be managed by the pain management clinic.  Historic Controlled Substance Pharmacotherapy Review  PMP and historical list of controlled substances: Tramadol 50 mg twice daily as needed  Historical Monitoring: The patient  reports no history of drug use. List of all UDS Test(s): Lab Results  Component Value Date   COCAINSCRNUR NONE DETECTED 02/26/2015   THCU NONE DETECTED 02/26/2015   ETH <5 02/26/2015   List of other Serum/Urine Drug Screening Test(s):  Lab Results  Component Value Date   COCAINSCRNUR NONE DETECTED 02/26/2015  THCU NONE DETECTED 02/26/2015   ETH <5  02/26/2015   Historical Background Evaluation: [place] PMP: PDMP reviewed during this encounter. Online review of the past 61-monthperiod conducted.             [place] Department of public safety, offender search: (Editor, commissioningInformation) Non-contributory Risk Assessment Profile: Aberrant behavior: None observed or detected today Risk factors for fatal opioid overdose: None identified today Fatal overdose hazard ratio (HR): Calculation deferred Non-fatal overdose hazard ratio (HR): Calculation deferred Risk of opioid abuse or dependence: 0.7-3.0% with doses ? 36 MME/day and 6.1-26% with doses ? 120 MME/day. Substance use disorder (SUD) risk level: See below Personal History of Substance Abuse (SUD-Substance use disorder):  Alcohol: Negative  Illegal Drugs: Negative  Rx Drugs: Negative  ORT Risk Level calculation: Low Risk  Opioid Risk Tool - 03/13/20 1059      Family History of Substance Abuse   Alcohol Negative    Illegal Drugs Negative    Rx Drugs Negative      Personal History of Substance Abuse   Alcohol Negative    Illegal Drugs Negative    Rx Drugs Negative      Age   Age between 153-45years  No      History of Preadolescent Sexual Abuse   History of Preadolescent Sexual Abuse Negative or Female      Psychological Disease   Psychological Disease Negative    Depression Negative      Total Score   Opioid Risk Tool Scoring 0    Opioid Risk Interpretation Low Risk          ORT Scoring interpretation table:  Score <3 = Low Risk for SUD  Score between 4-7 = Moderate Risk for SUD  Score >8 = High Risk for Opioid Abuse   PHQ-2 Depression Scale:  Total score: 0  PHQ-2 Scoring interpretation table: (Score and probability of major depressive disorder)  Score 0 = No depression  Score 1 = 15.4% Probability  Score 2 = 21.1% Probability  Score 3 = 38.4% Probability  Score 4 = 45.5% Probability  Score 5 = 56.4% Probability  Score 6 = 78.6% Probability   PHQ-9 Depression Scale:   Total score: 0  PHQ-9 Scoring interpretation table:  Score 0-4 = No depression  Score 5-9 = Mild depression  Score 10-14 = Moderate depression  Score 15-19 = Moderately severe depression  Score 20-27 = Severe depression (2.4 times higher risk of SUD and 2.89 times higher risk of overuse)   Pharmacologic Plan: As per protocol, I have not taken over any controlled substance management, pending the results of ordered tests and/or consults.            Initial impression: Pending review of available data and ordered tests.  Meds   Current Outpatient Medications:  .  cetirizine (ZYRTEC) 10 MG tablet, TAKE 1 TABLET BY MOUTH EVERY DAY AS NEEDED FOR ALLERGY, Disp: 90 tablet, Rfl: 1 .  cyclobenzaprine (FLEXERIL) 5 MG tablet, TAKE 1/2 TO 1 TABLET (2.5-5 MG TOTAL) BY MOUTH DAILY AS NEEDED (FOOT PAIN)., Disp: 30 tablet, Rfl: 0 .  DULoxetine (CYMBALTA) 30 MG capsule, Take 30 mg by mouth daily., Disp: , Rfl:  .  fluticasone (FLONASE) 50 MCG/ACT nasal spray, Place 1 spray into both nostrils 2 (two) times daily., Disp: 16 g, Rfl: 0 .  levothyroxine (SYNTHROID) 88 MCG tablet, TAKE 1 TABLET BY MOUTH EVERY MORNING ON EMPTY STOMACH WITH WATER ONLY-NO FOOD OR OTHER MED X 30  MIN, Disp: 90 tablet, Rfl: 3 .  pantoprazole (PROTONIX) 40 MG tablet, Take 40 mg by mouth daily., Disp: , Rfl:  .  pregabalin (LYRICA) 75 MG capsule, 2 tabs twice a day 3 tabs every night, Disp: 450 capsule, Rfl: 4 .  sertraline (ZOLOFT) 50 MG tablet, , Disp: , Rfl:  .  SUMAtriptan (IMITREX) 25 MG tablet, TAKE 1 TABLET BY MOUTH (MAY REPEAT IN 2 HOURS IF HEADACHE PERSISTS OR RECURS.), Disp: 9 tablet, Rfl: 0 .  topiramate (TOPAMAX) 50 MG tablet, Take 1 tablet (50 mg total) by mouth at bedtime. For migraine prevention., Disp: 90 tablet, Rfl: 1 .  traMADol (ULTRAM) 50 MG tablet, 50 mg at bedtime., Disp: , Rfl:  .  traZODone (DESYREL) 50 MG tablet, Take 3 tablets (150 mg total) by mouth at bedtime., Disp: 270 tablet, Rfl: 1  Imaging Review    Narrative CLINICAL DATA:  55 year old female with left arm pain.  EXAM: CERVICAL SPINE - COMPLETE 4+ VIEW  COMPARISON:  None.  FINDINGS: There is no acute fracture or subluxation of the cervical spine. The bones are osteopenic. The visualized posterior elements and odontoid appear intact. There is anatomic alignment the lateral masses of C1 and C2. Mild neural foramina narrowing at left C3-C4 and C5-C6. The soft tissues are unremarkable.  IMPRESSION: No acute/traumatic cervical spine pathology.   Electronically Signed By: Anner Crete M.D. On: 11/14/2018 15:49  Complexity Note: Imaging results reviewed. Results shared with Ms. Corona, using Layman's terms.                         ROS  Cardiovascular: No reported cardiovascular signs or symptoms such as High blood pressure, coronary artery disease, abnormal heart rate or rhythm, heart attack, blood thinner therapy or heart weakness and/or failure Pulmonary or Respiratory: No reported pulmonary signs or symptoms such as wheezing and difficulty taking a deep full breath (Asthma), difficulty blowing air out (Emphysema), coughing up mucus (Bronchitis), persistent dry cough, or temporary stoppage of breathing during sleep Neurological: No reported neurological signs or symptoms such as seizures, abnormal skin sensations, urinary and/or fecal incontinence, being born with an abnormal open spine and/or a tethered spinal cord Psychological-Psychiatric: No reported psychological or psychiatric signs or symptoms such as difficulty sleeping, anxiety, depression, delusions or hallucinations (schizophrenial), mood swings (bipolar disorders) or suicidal ideations or attempts Gastrointestinal: Reflux or heatburn Genitourinary: No reported renal or genitourinary signs or symptoms such as difficulty voiding or producing urine, peeing blood, non-functioning kidney, kidney stones, difficulty emptying the bladder, difficulty controlling the flow of  urine, or chronic kidney disease Hematological: No reported hematological signs or symptoms such as prolonged bleeding, low or poor functioning platelets, bruising or bleeding easily, hereditary bleeding problems, low energy levels due to low hemoglobin or being anemic Endocrine: No reported endocrine signs or symptoms such as high or low blood sugar, rapid heart rate due to high thyroid levels, obesity or weight gain due to slow thyroid or thyroid disease Rheumatologic: No reported rheumatological signs and symptoms such as fatigue, joint pain, tenderness, swelling, redness, heat, stiffness, decreased range of motion, with or without associated rash Musculoskeletal: Negative for myasthenia gravis, muscular dystrophy, multiple sclerosis or malignant hyperthermia Work History: Working full time  Allergies  Ms. Avilla is allergic to penicillins, gabapentin, and sulfa antibiotics.  Laboratory Chemistry Profile   Renal Lab Results  Component Value Date   BUN 6 06/11/2019   CREATININE 0.69 06/11/2019   BCR 23 02/09/2019  GFR 88.43 06/11/2019   GFRAA 82 02/09/2019   GFRNONAA 71 02/09/2019   SPECGRAV 1.025 08/08/2019   PHUR 6.0 08/08/2019   PROTEINUR Negative 08/08/2019     Electrolytes Lab Results  Component Value Date   NA 143 06/11/2019   K 4.1 06/11/2019   CL 109 06/11/2019   CALCIUM 8.3 (L) 06/11/2019   MG 1.8 01/26/2018   PHOS 4.8 (H) 01/26/2018     Hepatic Lab Results  Component Value Date   AST 15 06/11/2019   ALT 12 06/11/2019   ALBUMIN 3.3 (L) 06/11/2019   ALKPHOS 73 06/11/2019   LIPASE 40 01/19/2018     ID Lab Results  Component Value Date   HIV Non Reactive 01/20/2018   SARSCOV2NAA Not Detected 02/09/2019   MRSAPCR NEGATIVE 01/19/2018     Bone Lab Results  Component Value Date   VD25OH 44.37 12/06/2019     Endocrine Lab Results  Component Value Date   GLUCOSE 80 06/11/2019   GLUCOSEU NEGATIVE 01/19/2018   HGBA1C 5.7 08/29/2018   TSH 9.10 (H)  02/21/2020   FREET4 0.95 01/10/2019     Neuropathy Lab Results  Component Value Date   VITAMINB12 666 06/11/2019   HGBA1C 5.7 08/29/2018   HIV Non Reactive 01/20/2018     CNS No results found for: COLORCSF, APPEARCSF, RBCCOUNTCSF, WBCCSF, POLYSCSF, LYMPHSCSF, EOSCSF, PROTEINCSF, GLUCCSF, JCVIRUS, CSFOLI, IGGCSF, LABACHR, ACETBL, LABACHR, ACETBL   Inflammation (CRP: Acute  ESR: Chronic) No results found for: CRP, ESRSEDRATE, LATICACIDVEN   Rheumatology No results found for: RF, ANA, LABURIC, URICUR, LYMEIGGIGMAB, LYMEABIGMQN, HLAB27   Coagulation Lab Results  Component Value Date   INR 1.06 01/19/2018   LABPROT 13.7 01/19/2018   PLT 205.0 06/11/2019     Cardiovascular Lab Results  Component Value Date   HGB 11.7 (L) 06/11/2019   HCT 35.4 (L) 06/11/2019     Screening Lab Results  Component Value Date   SARSCOV2NAA Not Detected 02/09/2019   MRSAPCR NEGATIVE 01/19/2018   HIV Non Reactive 01/20/2018     Cancer No results found for: CEA, CA125, LABCA2   Allergens No results found for: ALMOND, APPLE, ASPARAGUS, AVOCADO, BANANA, BARLEY, BASIL, BAYLEAF, GREENBEAN, LIMABEAN, WHITEBEAN, BEEFIGE, REDBEET, BLUEBERRY, BROCCOLI, CABBAGE, MELON, CARROT, CASEIN, CASHEWNUT, CAULIFLOWER, CELERY     Note: Lab results reviewed.  [place]  Drug: Ms. Rundquist  reports no history of drug use. Alcohol:  reports previous alcohol use. Tobacco:  reports that she has never smoked. She has never used smokeless tobacco. Medical:  has a past medical history of Acquired pyloric stricture, Bleeding duodenal ulcer, Chronic esophagogastric ulcer, Depression, Duodenal obstruction, Gastric outlet obstruction (01/19/2018), GERD (gastroesophageal reflux disease), History of stomach ulcers, Hypothyroidism, Incisional hernia, without obstruction or gangrene, Intractable vomiting, Kidney stones, Migraine, Migraine headache, Neuropathy, and Side pain (03/30/2018). Family: family history includes Alcohol abuse in  her maternal grandmother and sister; Bipolar disorder in her sister; Breast cancer (age of onset: 8) in her mother; Cancer in her mother; Lung cancer in her father; Stroke in her maternal grandmother and paternal grandmother.  Past Surgical History:  Procedure Laterality Date  . ABDOMINAL HYSTERECTOMY  2004   partial  . CHOLECYSTECTOMY    . COLONOSCOPY WITH PROPOFOL N/A 02/27/2018   Procedure: COLONOSCOPY WITH PROPOFOL;  Surgeon: [name], MD;  Location: [place];  Service: Endoscopy;  Laterality: N/A;  . ESOPHAGOGASTRODUODENOSCOPY (EGD) WITH PROPOFOL N/A 01/12/2018   Procedure: ESOPHAGOGASTRODUODENOSCOPY (EGD) WITH BIOPSIES;  Surgeon: [name], MD;  Location: [place];  Service: Endoscopy;  Laterality: N/A;  . ESOPHAGOGASTRODUODENOSCOPY (EGD) WITH PROPOFOL N/A 01/25/2018   Procedure: ESOPHAGOGASTRODUODENOSCOPY (EGD) WITH PROPOFOL;  Surgeon: Jonathon Bellows, MD;  Location[facility] ENDOSCOPY;  Service: Gastroenterology;  Laterality: N/A;  . ESOPHAGOGASTRODUODENOSCOPY (EGD) WITH PROPOFOL N/A 02/27/2018   Procedure: ESOPHAGOGASTRODUODENOSCOPY (EGD) WITH PROPOFOL;  Surgeon: [name], MD;  Location: [place];  Service: Endoscopy;  Laterality: N/A;  . INCISIONAL HERNIA REPAIR N/A 11/23/2018   Procedure: HERNIA REPAIR INCISIONAL;  Surgeon: Olean Ree, MD;  Location: [facility];  Service: General;  Laterality: N/A;  . INSERTION OF MESH N/A 11/23/2018   Procedure: INSERTION OF MESH;  Surgeon: Olean Ree, MD;  Location: [facility];  Service: General;  Laterality: N/A;  . LAPAROTOMY N/A 01/20/2018   Procedure: EXPLORATORY LAPAROTOMY;  Surgeon: Olean Ree, MD;  Location: [facility];  Service: General;  Laterality: N/A;  . TOOTH EXTRACTION     Active Ambulatory Problems    Diagnosis Date Noted  . GERD (gastroesophageal reflux disease)   . Migraines   . Hypothyroidism   . Insomnia 08/30/2013  . Fatigue 12/13/2013  . Constipation 04/02/2014  . Dysuria 08/26/2014   . PTSD (post-traumatic stress disorder) 02/27/2015  . Bipolar I disorder, most recent episode depressed ([place]) 03/01/2015  . Preventative health care 09/16/2016  . Plantar fascial fibromatosis of both feet 11/10/2016  . Vitamin D deficiency 07/28/2017  . Vitamin B 12 deficiency 07/28/2017  . Chronic low back pain 07/28/2017  . Abdominal pain, generalized   . Protein-calorie malnutrition, severe 01/20/2018  . Urge incontinence 08/11/2018  . Sacroiliac joint pain 08/14/2018  . Hair loss 09/22/2018  . Idiopathic peripheral neuropathy 07/23/2019  . Paresthesia 12/13/2019  . CAP (community acquired pneumonia) 02/12/2020  . Bilateral foot pain 03/13/2020  . Chronic pain syndrome 03/13/2020   Resolved Ambulatory Problems    Diagnosis Date Noted  . Depression   . Bleeding duodenal ulcer   . Acute upper respiratory infections of unspecified site 03/22/2013  . Encounter for routine gynecological examination 03/22/2013  . Loss of weight 03/22/2013  . Dizziness and giddiness 03/29/2013  . UTI (urinary tract infection) 08/30/2013  . Decreased urine volume 02/19/2014  . Well woman exam 05/09/2014  . Overuse of medication 05/09/2014  . Abnormal mammogram 05/27/2014  . MDD (major depressive disorder), recurrent episode, severe ([place]) 02/26/2015  . MDD (major depressive disorder), recurrent severe, without psychosis ([place]) 02/27/2015  . Suicidal ideation 03/19/2015  . Persistent cough 02/23/2016  . Foul smelling urine 12/28/2016  . Epigastric pain 12/19/2017  . Duodenal obstruction   . Chronic esophagogastric ulcer   . Gastric outlet obstruction 01/19/2018  . Acquired pyloric stricture   . Intractable vomiting   . Side pain 03/30/2018  . Abdominal distension 08/25/2018  . Left arm pain 11/15/2018  . Incisional hernia, without obstruction or gangrene    Past Medical History:  Diagnosis Date  . History of stomach ulcers   . Kidney stones   . Migraine   . Migraine headache   .  Neuropathy    Constitutional Exam  General appearance: Well nourished, well developed, and well hydrated. In no apparent acute distress Vitals:   03/13/20 1049  BP: 116/75  Pulse: 91  Resp: 16  Temp: (!) 97.3 F (36.3 C)  TempSrc: Temporal  SpO2: 97%  Weight: 105 lb (47.6 kg)  Height: 4' 11"  (1.499 m)   BMI Assessment: Estimated body mass index is 21.21 kg/m as calculated from the following:   Height as of this encounter: 4'  11" (1.499 m).   Weight as of this encounter: 105 lb (47.6 kg).  BMI interpretation table: BMI level Category Range association with higher incidence of chronic pain  <18 kg/m2 Underweight   18.5-24.9 kg/m2 Ideal body weight   25-29.9 kg/m2 Overweight Increased incidence by 20%  30-34.9 kg/m2 Obese (Class I) Increased incidence by 68%  35-39.9 kg/m2 Severe obesity (Class II) Increased incidence by 136%  >40 kg/m2 Extreme obesity (Class III) Increased incidence by 254%   Patient's current BMI Ideal Body weight  Body mass index is 21.21 kg/m. Patient must be at least 60 in tall to calculate ideal body weight   BMI Readings from Last 4 Encounters:  03/13/20 21.21 kg/m  02/12/20 22.22 kg/m  12/13/19 22.32 kg/m  10/05/19 21.71 kg/m   Wt Readings from Last 4 Encounters:  03/13/20 105 lb (47.6 kg)  02/12/20 110 lb (49.9 kg)  12/13/19 110 lb 8 oz (50.1 kg)  10/05/19 107 lb 8 oz (48.8 kg)    Psych/Mental status: Alert, oriented x 3 (person, place, & time)       Eyes: PERLA Respiratory: No evidence of acute respiratory distress  Cervical Spine Exam  Skin & Axial Inspection: No masses, redness, edema, swelling, or associated skin lesions Alignment: Symmetrical Functional ROM: Unrestricted ROM      Stability: No instability detected Muscle Tone/Strength: Functionally intact. No obvious neuro-muscular anomalies detected. Sensory (Neurological): Unimpaired Palpation: No palpable anomalies              Upper Extremity (UE) Exam    Side: Right  upper extremity  Side: Left upper extremity  Skin & Extremity Inspection: Skin color, temperature, and hair growth are WNL. No peripheral edema or cyanosis. No masses, redness, swelling, asymmetry, or associated skin lesions. No contractures.  Skin & Extremity Inspection: Skin color, temperature, and hair growth are WNL. No peripheral edema or cyanosis. No masses, redness, swelling, asymmetry, or associated skin lesions. No contractures.  Functional ROM: Unrestricted ROM          Functional ROM: Unrestricted ROM          Muscle Tone/Strength: Functionally intact. No obvious neuro-muscular anomalies detected.   Muscle Tone/Strength: Functionally intact. No obvious neuro-muscular anomalies detected.  Sensory (Neurological): Unimpaired          Sensory (Neurological): Unimpaired          Palpation: No palpable anomalies              Palpation: No palpable anomalies              Provocative Test(s):  Phalen's test: deferred Tinel's test: deferred Apley's scratch test (touch opposite shoulder):  Action 1 (Across chest): deferred Action 2 (Overhead): deferred Action 3 (LB reach): deferred   Provocative Test(s):  Phalen's test: deferred Tinel's test: deferred Apley's scratch test (touch opposite shoulder):  Action 1 (Across chest): deferred Action 2 (Overhead): deferred Action 3 (LB reach): deferred    Thoracic Spine Area Exam  Skin & Axial Inspection: No masses, redness, or swelling Alignment: Symmetrical Functional ROM: Unrestricted ROM Stability: No instability detected Muscle Tone/Strength: Functionally intact. No obvious neuro-muscular anomalies detected. Sensory (Neurological): Unimpaired  Lumbar Exam  Skin & Axial Inspection: No masses, redness, or swelling Alignment: Symmetrical Functional ROM: Unrestricted ROM       Stability: No instability detected Muscle Tone/Strength: Functionally intact. No obvious neuro-muscular anomalies detected. Sensory (Neurological):  Unimpaired  *(Flexion, ABduction and External Rotation)  Gait & Posture Assessment  Ambulation: Unassisted Gait: Relatively normal  for age and body habitus Posture: WNL   Lower Extremity Exam    Side: Right lower extremity  Side: Left lower extremity  Stability: No instability observed          Stability: No instability observed          Skin & Extremity Inspection: Skin color, temperature, and hair growth are WNL. No peripheral edema or cyanosis. No masses, redness, swelling, asymmetry, or associated skin lesions. No contractures.  Skin & Extremity Inspection: Skin color, temperature, and hair growth are WNL. No peripheral edema or cyanosis. No masses, redness, swelling, asymmetry, or associated skin lesions. No contractures.  Functional ROM: Pain restricted ROM ankle/foot          Functional ROM: Pain restricted ROM ankle/foot          Muscle Tone/Strength: Functionally intact. No obvious neuro-muscular anomalies detected.  Muscle Tone/Strength: Functionally intact. No obvious neuro-muscular anomalies detected.  Sensory (Neurological): Neuropathic pain pattern        Sensory (Neurological): Neuropathic pain pattern        DTR: Patellar: deferred today Achilles: deferred today Plantar: deferred today  DTR: Patellar: deferred today Achilles: deferred today Plantar: deferred today  Palpation: No palpable anomalies  Palpation: No palpable anomalies   Assessment  Primary Diagnosis & Pertinent Problem List: The primary encounter diagnosis was Plantar fascial fibromatosis of both feet. Diagnoses of Idiopathic peripheral neuropathy, Bilateral foot pain, PTSD (post-traumatic stress disorder), Bipolar I disorder, most recent episode depressed ([place]), and Chronic pain syndrome were also pertinent to this visit.  Visit Diagnosis (New problems to examiner): 1. Plantar fascial fibromatosis of both feet   2. Idiopathic peripheral neuropathy   3. Bilateral foot pain   4. PTSD (post-traumatic  stress disorder)   5. Bipolar I disorder, most recent episode depressed ([place])   6. Chronic pain syndrome    Plan of Care (Initial workup plan)  Note: Ms. Cockerham was reminded that as per protocol, today's visit has been an evaluation only. We have not taken over the patient's controlled substance management.  Urine toxicology screen and referral to psychiatry for risk evaluation prior to having patient sign pain contract and managing her tramadol for chronic pain management.  Patient instructed to follow-up after she has completed her psych evaluation at that time if everything is appropriate, I will have her sign pain contract and can take over her tramadol.  We can also consider doing bilateral heel steroid injections at that time as well.  Patient endorsed understanding.   Lab Orders     Compliance Drug Analysis, Ur  Referral Orders     Ambulatory referral to Psychology  Pharmacological management options:  Opioid Analgesics: The patient was informed that there is no guarantee that she would be a candidate for opioid analgesics. The decision will be made following CDC guidelines. This decision will be based on the results of diagnostic studies, as well as Ms. Milazzo's risk profile.   Membrane stabilizer: Adequate regimen Lyrica 75 mg twice daily during the day and 225 mg nightly managed by PCP  Muscle relaxant: To be determined at a later time  NSAID: To be determined at a later time  Other analgesic(s): To be determined at a later time   Interventional management options: Ms. Dorfman was informed that there is no guarantee that she would be a candidate for interventional therapies. The decision will be based on the results of diagnostic studies, as well as Ms. Hillmann's risk profile.  Procedure(s) under consideration:  Bilateral  heel steroid injections for plantar fasciitis   Provider-requested follow-up: Return for pt will call , After Psychological evaluation.  Future Appointments  Date  Time Provider [place]  03/19/2020 10:00 AM Pleas Koch, NP [facility]    Note by: Gillis Santa, MD Date: 03/13/2020; Time: 1:13 PM

## 2020-03-13 NOTE — Progress Notes (Signed)
Safety precautions to be maintained throughout the outpatient stay will include: orient to surroundings, keep bed in low position, maintain call bell within reach at all times, provide assistance with transfer out of bed and ambulation.  

## 2020-03-13 NOTE — Patient Instructions (Signed)
You will complete psych eval prior to coming for next appt.

## 2020-03-18 DIAGNOSIS — Z0271 Encounter for disability determination: Secondary | ICD-10-CM

## 2020-03-18 LAB — COMPLIANCE DRUG ANALYSIS, UR

## 2020-03-19 ENCOUNTER — Ambulatory Visit: Payer: 59 | Admitting: Primary Care

## 2020-03-19 ENCOUNTER — Other Ambulatory Visit: Payer: Self-pay | Admitting: Primary Care

## 2020-03-19 ENCOUNTER — Ambulatory Visit (INDEPENDENT_AMBULATORY_CARE_PROVIDER_SITE_OTHER)
Admission: RE | Admit: 2020-03-19 | Discharge: 2020-03-19 | Disposition: A | Discharge status: Home / Self Care | Payer: 59 | Source: Ambulatory Visit | Attending: Primary Care | Admitting: Primary Care

## 2020-03-19 ENCOUNTER — Encounter: Payer: Self-pay | Admitting: Primary Care

## 2020-03-19 ENCOUNTER — Other Ambulatory Visit: Payer: Self-pay

## 2020-03-19 VITALS — BP 114/74 | HR 94 | Temp 97.6°F | Ht 59.0 in | Wt 118.0 lb

## 2020-03-19 DIAGNOSIS — J189 Pneumonia, unspecified organism: Secondary | ICD-10-CM | POA: Diagnosis not present

## 2020-03-19 DIAGNOSIS — N3941 Urge incontinence: Secondary | ICD-10-CM

## 2020-03-19 DIAGNOSIS — R32 Unspecified urinary incontinence: Secondary | ICD-10-CM

## 2020-03-19 DIAGNOSIS — E038 Other specified hypothyroidism: Secondary | ICD-10-CM

## 2020-03-19 LAB — POC URINALSYSI DIPSTICK (AUTOMATED)
Bilirubin, UA: NEGATIVE
Blood, UA: NEGATIVE
Glucose, UA: NEGATIVE
Leukocytes, UA: NEGATIVE
Nitrite, UA: NEGATIVE
Protein, UA: POSITIVE — AB
Spec Grav, UA: >=1.03 — AB (ref 1.010–1.025)
Urobilinogen, UA: 0.2 E.U./dL
pH, UA: 5 (ref 5.0–8.0)

## 2020-03-19 LAB — HEMOGLOBIN A1C: Hgb A1c MFr Bld: 5.8 % (ref 4.6–6.5)

## 2020-03-19 LAB — TSH: TSH: 2.58 u[IU]/mL (ref 0.35–4.50)

## 2020-03-19 NOTE — Patient Instructions (Signed)
Stop by the lab and xray prior to leaving today. I will notify you of your results once received.   Be sure to take your levothyroxine (thyroid medication) every morning on an empty stomach with water only. No food or other medications for 30 minutes. No heartburn medication, iron pills, calcium, vitamin D, or magnesium pills within four hours of taking levothyroxine.   It was a pleasure to see you today!  

## 2020-03-19 NOTE — Assessment & Plan Note (Signed)
She is taking levothyroxine 88 mcg correctly, repeat TSH pending. Continue levothyroxine 88 mcg daily.

## 2020-03-19 NOTE — Progress Notes (Signed)
Subjective:    Patient ID: Tanya Nguyen, female    DOB: [birth year], 55 y.o.   MRN: [id number]  HPI  This visit occurred during the SARS-CoV-2 public health emergency.  Safety protocols were in place, including screening questions prior to the visit, additional usage of staff PPE, and extensive cleaning of exam room while observing appropriate contact time as indicated for disinfecting solutions.   Tanya Nguyen is a 55 year old female with a history of CAP, GERD, recurrent UTI, Bipolar Disorder, constipation, fatigue, hypothyroidism who presents today for follow up of thyroid disease and with a chief complaint of urinary incontinence. She is also due for repeat chest xray since she had pneumonia.   1) Hypothyroidism: Currently managed on levothyroxine 88 mcg. TSH of 9.10 in mid December 2021. We called her due to her abnormal result and she endorsed taking vitamins within four hours of levothyroxine. Given this we asked she come in to repeat level prior to adjusting.   2) Urinary Incontinence: Chronic for years, increased over the last three weeks. She will notice urinary urgency and will lose control of her urine before making it to the bathroom. This has been occurring everyday for the last three weeks.   She denies hematuria, dysuria, vaginal symptoms. She was once managed on oxybutynin XL 5 mg which helped with chronic incontinence, but she stopped taking this as she felt like she was doing better. She denies consumption of caffeine.   Wt Readings from Last 3 Encounters:  03/13/20 105 lb (47.6 kg)  02/12/20 110 lb (49.9 kg)  12/13/19 110 lb 8 oz (50.1 kg)     Review of Systems  Constitutional: Negative for fatigue.  Respiratory: Negative for shortness of breath.   Cardiovascular: Negative for chest pain.  Genitourinary: Positive for frequency and urgency. Negative for dysuria and vaginal discharge.       Past Medical History:  Diagnosis Date  . Acquired pyloric stricture   .  Bleeding duodenal ulcer   . Chronic esophagogastric ulcer   . Depression   . Duodenal obstruction   . Gastric outlet obstruction 01/19/2018  . GERD (gastroesophageal reflux disease)   . History of stomach ulcers   . Hypothyroidism   . Incisional hernia, without obstruction or gangrene   . Intractable vomiting   . Kidney stones   . Migraine   . Migraine headache    only1-2x/month since starting topamax  . Neuropathy   . Side pain 03/30/2018     Social History   Socioeconomic History  . Marital status: Married    Spouse name: Not on file  . Number of children: 0  . Years of education: 68  . Highest education level: High school graduate  Occupational History  . Occupation: Homemaker  Tobacco Use  . Smoking status: Never Smoker  . Smokeless tobacco: Never Used  Vaping Use  . Vaping Use: Never used  Substance and Sexual Activity  . Alcohol use: Not Currently    Alcohol/week: 0.0 standard drinks  . Drug use: No  . Sexual activity: Yes    Birth control/protection: None  Other Topics Concern  . Not on file  Social History Narrative   Lives at home with her husband.   Right-handed.   Rare caffeine.   Social Determinants of Health   Financial Resource Strain: Not on file  Food Insecurity: Not on file  Transportation Needs: Not on file  Physical Activity: Not on file  Stress: Not on file  Social Connections: Not  on file  Intimate Partner Violence: Not on file    Past Surgical History:  Procedure Laterality Date  . ABDOMINAL HYSTERECTOMY  2004   partial  . CHOLECYSTECTOMY    . COLONOSCOPY WITH PROPOFOL N/A 02/27/2018   Procedure: COLONOSCOPY WITH PROPOFOL;  Surgeon: [name], MD;  Location: [place];  Service: Endoscopy;  Laterality: N/A;  . ESOPHAGOGASTRODUODENOSCOPY (EGD) WITH PROPOFOL N/A 01/12/2018   Procedure: ESOPHAGOGASTRODUODENOSCOPY (EGD) WITH BIOPSIES;  Surgeon: [name], MD;  Location: [place];  Service: Endoscopy;   Laterality: N/A;  . ESOPHAGOGASTRODUODENOSCOPY (EGD) WITH PROPOFOL N/A 01/25/2018   Procedure: ESOPHAGOGASTRODUODENOSCOPY (EGD) WITH PROPOFOL;  Surgeon: Jonathon Bellows, MD;  Location: [facility] ENDOSCOPY;  Service: Gastroenterology;  Laterality: N/A;  . ESOPHAGOGASTRODUODENOSCOPY (EGD) WITH PROPOFOL N/A 02/27/2018   Procedure: ESOPHAGOGASTRODUODENOSCOPY (EGD) WITH PROPOFOL;  Surgeon: [name], MD;  Location: [place];  Service: Endoscopy;  Laterality: N/A;  . INCISIONAL HERNIA REPAIR N/A 11/23/2018   Procedure: HERNIA REPAIR INCISIONAL;  Surgeon: Olean Ree, MD;  Location: [facility];  Service: General;  Laterality: N/A;  . INSERTION OF MESH N/A 11/23/2018   Procedure: INSERTION OF MESH;  Surgeon: Olean Ree, MD;  Location: [facility];  Service: General;  Laterality: N/A;  . LAPAROTOMY N/A 01/20/2018   Procedure: EXPLORATORY LAPAROTOMY;  Surgeon: Olean Ree, MD;  Location: [facility];  Service: General;  Laterality: N/A;  . TOOTH EXTRACTION      Family History  Problem Relation Age of Onset  . Cancer Mother   . Breast cancer Mother 26  . Lung cancer Father   . Alcohol abuse Sister   . Bipolar disorder Sister   . Alcohol abuse Maternal Grandmother   . Stroke Maternal Grandmother   . Stroke Paternal Grandmother     Allergies  Allergen Reactions  . Penicillins Shortness Of Breath, Diarrhea and Nausea And Vomiting    Has patient had a PCN reaction causing immediate rash, facial/tongue/throat swelling, SOB or lightheadedness with hypotension: yes Has patient had a PCN reaction causing severe rash involving mucus membranes or skin necrosis: no Has patient had a PCN reaction that required hospitalization no Has patient had a PCN reaction occurring within the last 10 years: about 10 years If all of the above answers are "NO", then may proceed with Cephalosporin use.   . Gabapentin     Hair loss  . Sulfa Antibiotics Nausea And Vomiting    Other reaction(s): VOMITING    Current  Outpatient Medications on File Prior to Visit  Medication Sig Dispense Refill  . cetirizine (ZYRTEC) 10 MG tablet TAKE 1 TABLET BY MOUTH EVERY DAY AS NEEDED FOR ALLERGY 90 tablet 1  . cyclobenzaprine (FLEXERIL) 5 MG tablet TAKE 1/2 TO 1 TABLET (2.5-5 MG TOTAL) BY MOUTH DAILY AS NEEDED (FOOT PAIN). 30 tablet 0  . DULoxetine (CYMBALTA) 30 MG capsule Take 30 mg by mouth daily.    . fluticasone (FLONASE) 50 MCG/ACT nasal spray Place 1 spray into both nostrils 2 (two) times daily. 16 g 0  . levothyroxine (SYNTHROID) 88 MCG tablet TAKE 1 TABLET BY MOUTH EVERY MORNING ON EMPTY STOMACH WITH WATER ONLY-NO FOOD OR OTHER MED X 30 MIN 90 tablet 3  . pantoprazole (PROTONIX) 40 MG tablet Take 40 mg by mouth daily.    . pregabalin (LYRICA) 75 MG capsule 2 tabs twice a day 3 tabs every night 450 capsule 4  . sertraline (ZOLOFT) 50 MG tablet     . SUMAtriptan (IMITREX) 25 MG tablet TAKE 1 TABLET  BY MOUTH (MAY REPEAT IN 2 HOURS IF HEADACHE PERSISTS OR RECURS.) 9 tablet 0  . topiramate (TOPAMAX) 50 MG tablet Take 1 tablet (50 mg total) by mouth at bedtime. For migraine prevention. 90 tablet 1  . traMADol (ULTRAM) 50 MG tablet 50 mg at bedtime.    . traZODone (DESYREL) 50 MG tablet Take 3 tablets (150 mg total) by mouth at bedtime. 270 tablet 1   No current facility-administered medications on file prior to visit.    There were no vitals taken for this visit.   Objective:   Physical Exam Constitutional:      Appearance: She is well-nourished.  Cardiovascular:     Rate and Rhythm: Normal rate and regular rhythm.  Pulmonary:     Effort: Pulmonary effort is normal.     Breath sounds: Normal breath sounds.  Musculoskeletal:     Cervical back: Neck supple.  Skin:    General: Skin is warm and dry.  Psychiatric:        Mood and Affect: Mood and affect normal.            Assessment & Plan:

## 2020-03-19 NOTE — Assessment & Plan Note (Signed)
Recovered, lungs clear today. Repeat chest xray pending.

## 2020-03-19 NOTE — Assessment & Plan Note (Signed)
Chronic, increased symptoms x 3 weeks. UA today overall negative, will add culture given her history of acute cystitis.  If UA culture is negative, then will resend oxybutynin XL 5 mg as she did well before.

## 2020-03-20 ENCOUNTER — Telehealth: Payer: Self-pay | Admitting: Primary Care

## 2020-03-20 LAB — URINE CULTURE
MICRO NUMBER:: 11409985
SPECIMEN QUALITY:: ADEQUATE

## 2020-03-20 NOTE — Telephone Encounter (Signed)
Patient is returning a call to our office. NO notes. EM

## 2020-03-21 ENCOUNTER — Other Ambulatory Visit: Payer: Self-pay | Admitting: Primary Care

## 2020-03-21 ENCOUNTER — Other Ambulatory Visit: Payer: Self-pay

## 2020-03-21 DIAGNOSIS — R32 Unspecified urinary incontinence: Secondary | ICD-10-CM

## 2020-03-21 MED ORDER — OXYBUTYNIN CHLORIDE ER 5 MG PO TB24
5.0000 mg | ORAL_TABLET | Freq: Every day | ORAL | 0 refills | Status: DC
Start: 2020-03-21 — End: 2020-03-21

## 2020-03-21 MED ORDER — OXYBUTYNIN CHLORIDE ER 5 MG PO TB24: 5.0000 mg | ORAL_TABLET | Freq: Every day | ORAL | 0 refills | Status: DC

## 2020-03-21 NOTE — Telephone Encounter (Signed)
See lab results for information

## 2020-03-21 NOTE — Telephone Encounter (Signed)
Pt returing call for her lab results

## 2020-03-25 ENCOUNTER — Telehealth: Payer: Self-pay | Admitting: Primary Care

## 2020-03-25 DIAGNOSIS — R059 Cough, unspecified: Secondary | ICD-10-CM

## 2020-03-25 NOTE — Telephone Encounter (Signed)
If she is not feeing better and also with those symptoms then we need to obtain a CT scan of her chest. I'll put this in as a stat order, also FYI to [place].

## 2020-03-25 NOTE — Telephone Encounter (Signed)
Patient is calling back requesting an antibiotic. Patient is complaining that she is still weak and not feeling well. Please call and advise the patient EM

## 2020-03-26 NOTE — Telephone Encounter (Signed)
Called and let patient know she will be getting call for CT

## 2020-03-26 NOTE — Telephone Encounter (Signed)
Pt is already scheduled and is aware of appt date/time 03/28/20 at 8:15am, NPO 4 hours prior  Nothing further needed.

## 2020-03-28 ENCOUNTER — Ambulatory Visit: Admission: RE | Admit: 2020-03-28 | Payer: 59 | Source: Ambulatory Visit

## 2020-03-31 ENCOUNTER — Telehealth: Payer: Self-pay | Admitting: Primary Care

## 2020-03-31 DIAGNOSIS — G609 Hereditary and idiopathic neuropathy, unspecified: Secondary | ICD-10-CM

## 2020-03-31 MED ORDER — CYCLOBENZAPRINE HCL 5 MG PO TABS
ORAL_TABLET | ORAL | 0 refills | Status: DC
Start: 2020-03-31 — End: 2020-06-17

## 2020-03-31 NOTE — Telephone Encounter (Signed)
Patient requests refill on: Cyclobenzaprine 5 mg   LAST REFILL: 01/22/2020 (Q-30, R-0) LAST OV: 03/19/2020 NEXT OV: Not Scheduled  PHARMACY: [id number] [place], [place]

## 2020-03-31 NOTE — Telephone Encounter (Signed)
And she doesn't take the medicine unless she is in pain

## 2020-03-31 NOTE — Telephone Encounter (Signed)
Pt called in wanted to know about getting a refill flexril

## 2020-03-31 NOTE — Telephone Encounter (Signed)
Requested Prescriptions   Signed Prescriptions Disp Refills  . cyclobenzaprine (FLEXERIL) 5 MG tablet 30 tablet 0    Sig: TAKE 1/2 TO 1 TABLET (2.5-5 MG TOTAL) BY MOUTH DAILY AS NEEDED (FOOT PAIN).    Authorizing Provider: Pleas Koch   Refill(s) sent to pharmacy.

## 2020-04-02 ENCOUNTER — Ambulatory Visit
Admission: RE | Admit: 2020-04-02 | Discharge: 2020-04-02 | Disposition: A | Discharge status: Home / Self Care | Payer: 59 | Source: Ambulatory Visit | Attending: Primary Care | Admitting: Primary Care

## 2020-04-02 ENCOUNTER — Other Ambulatory Visit: Payer: Self-pay | Admitting: Primary Care

## 2020-04-02 ENCOUNTER — Other Ambulatory Visit: Payer: Self-pay

## 2020-04-02 ENCOUNTER — Telehealth: Payer: Self-pay | Admitting: Student in an Organized Health Care Education/Training Program

## 2020-04-02 DIAGNOSIS — R059 Cough, unspecified: Secondary | ICD-10-CM | POA: Insufficient documentation

## 2020-04-02 LAB — POCT I-STAT CREATININE: Creatinine, Ser: 0.9 mg/dL (ref 0.44–1.00)

## 2020-04-02 MED ORDER — IOHEXOL 300 MG/ML  SOLN
60.0000 mL | Freq: Once | INTRAMUSCULAR | Status: AC | PRN
  Administered 2020-04-02: 60 mL via INTRAVENOUS

## 2020-04-02 NOTE — Telephone Encounter (Signed)
Spoke with Blanch Media and she states she will take care of it.

## 2020-04-02 NOTE — Telephone Encounter (Signed)
Patient states she cannot see Dr. Shea Evans due to her insurance. Need to know solution.

## 2020-04-03 ENCOUNTER — Other Ambulatory Visit: Payer: Self-pay | Admitting: Primary Care

## 2020-04-03 ENCOUNTER — Telehealth: Payer: Self-pay

## 2020-04-03 DIAGNOSIS — R059 Cough, unspecified: Secondary | ICD-10-CM

## 2020-04-03 DIAGNOSIS — J189 Pneumonia, unspecified organism: Secondary | ICD-10-CM

## 2020-04-03 MED ORDER — AZITHROMYCIN 250 MG PO TABS: ORAL_TABLET | ORAL | 0 refills | Status: DC

## 2020-04-03 NOTE — Telephone Encounter (Signed)
Patient called requesting another refill of her Tramadol.  She said that pain clinic will not give her any medication until contract is signed at next office visit.  She said she was almost out   Please advise

## 2020-04-04 NOTE — Telephone Encounter (Signed)
Pt called back regarding refill. Please advise.

## 2020-04-07 ENCOUNTER — Telehealth: Payer: Self-pay

## 2020-04-07 DIAGNOSIS — G609 Hereditary and idiopathic neuropathy, unspecified: Secondary | ICD-10-CM

## 2020-04-07 DIAGNOSIS — G894 Chronic pain syndrome: Secondary | ICD-10-CM

## 2020-04-07 DIAGNOSIS — M722 Plantar fascial fibromatosis: Secondary | ICD-10-CM

## 2020-04-07 NOTE — Telephone Encounter (Signed)
Pt was suppose referred to go to Physical Therapy & they were not in the  [facility] network. Said that she has called and noone has returned her call. She is unsure what to do.

## 2020-04-07 NOTE — Telephone Encounter (Signed)
Called patient to inquire. She needs another referral to Psy. For medication approval. Can you refer to someone in the [facility] .

## 2020-04-10 ENCOUNTER — Other Ambulatory Visit: Payer: Self-pay

## 2020-04-10 ENCOUNTER — Telehealth: Payer: Self-pay | Admitting: Gastroenterology

## 2020-04-10 MED ORDER — PANTOPRAZOLE SODIUM 40 MG PO TBEC: 40.0000 mg | DELAYED_RELEASE_TABLET | Freq: Every day | ORAL | 1 refills | Status: DC

## 2020-04-10 NOTE — Telephone Encounter (Signed)
Psychologist she was referred to by our office is not in network with her health insurance. She does not know who is. I advised her to find out what psychologist is in network and let us know.

## 2020-04-10 NOTE — Telephone Encounter (Signed)
Pt has questions about receiving medications. Was not sure if you all wanted me to just schedule her a follow up. She would like a nurse call

## 2020-04-10 NOTE — Telephone Encounter (Signed)
Left vm notifying pt the [facility] Advised pt on the voicemail she needs a follow up in the clinic as its been 2 years since her last appt,

## 2020-04-10 NOTE — Telephone Encounter (Signed)
Patient called needing refill on pantoprazole (PROTONIX) 40 MG tablet [facility] [address].  Patient says she is doing very well.

## 2020-04-13 ENCOUNTER — Other Ambulatory Visit: Payer: Self-pay | Admitting: Primary Care

## 2020-04-13 DIAGNOSIS — F3130 Bipolar disorder, current episode depressed, mild or moderate severity, unspecified: Secondary | ICD-10-CM

## 2020-04-13 DIAGNOSIS — M722 Plantar fascial fibromatosis: Secondary | ICD-10-CM

## 2020-04-13 NOTE — Telephone Encounter (Signed)
Is she taking duloxetine 30 mg still? I can't remember as she's changed her mind so many times. Also, she is due for general CPE and follow up with me in April 2022. Make sure she is scheduled.   If she is taking duloxetine 30 mg, okay to refill for #90, no refills.

## 2020-04-15 ENCOUNTER — Ambulatory Visit: Payer: 59 | Admitting: Podiatry

## 2020-04-15 ENCOUNTER — Telehealth: Payer: Self-pay

## 2020-04-15 NOTE — Telephone Encounter (Signed)
[facility]or for patient to see the Dr. that her insurance will cover.  Tanya Nguyen or Tanya Nguyen [phone]  I have called the office and left a VM for them to return call to see if they do indeed do the SUD assessment.  Called patient and informed her. Instructed her to call us back if she has not heard from [place] in a few days so we can follow up.

## 2020-04-15 NOTE — Telephone Encounter (Signed)
Called spoke to patient she is taking the Cymbalta 30 refill has been called in. She is not able to make app she is getting mom ready for bath. Will call office in morning to make appointment aware that if she does not follow up she can not get any further refills.

## 2020-04-15 NOTE — Telephone Encounter (Signed)
The patient called wanting to know why she hasnt gotten an appointment. I looked in her chart and she hasnt done the pysc eval yet. I let her know she had to do that and she said Dr. Shea Evans only would do Virtual and she doesn't know how to do that. I told her I could send it to Dr. Lyman Speller and she didn't want to go to [facility] I told her that she would have to get this done before he would prescribe her any medications. She said ok to go ahead and send it.

## 2020-04-18 ENCOUNTER — Telehealth: Payer: Self-pay | Admitting: Student in an Organized Health Care Education/Training Program

## 2020-04-18 ENCOUNTER — Ambulatory Visit: Payer: 59 | Admitting: Podiatry

## 2020-04-18 NOTE — Telephone Encounter (Signed)
Do you know who this is what this is in reference to?

## 2020-04-18 NOTE — Telephone Encounter (Signed)
Tanya Nguyen wants to know if we have found her a therapist that will accept her insurance. Her contact [phone].

## 2020-04-21 ENCOUNTER — Telehealth: Payer: Self-pay | Admitting: Neurology

## 2020-04-21 NOTE — Telephone Encounter (Addendum)
Pt already tried decreasing pregabalin to 1/day and still had side effects on this. Could not tolerate gabapentin. I will call to see if she is willing to try increasing her cymbalta to 60mg .  Called pt. She is agreeable to increase cymbalta to 60mg  po qd. She will try this for a couple weeks. If ineffective, she will call back to let us know. She is aware we can send in updated script for 60 mg per day if she tolerates well and it is effective. She will use current rx for 30mg  and takes two capsules daily.

## 2020-04-21 NOTE — Addendum Note (Signed)
Addended by: Wyvonnia Lora on: 04/21/2020 12:03 PM   Modules accepted: Orders

## 2020-04-21 NOTE — Telephone Encounter (Signed)
Called pt back to further discuss. She believes she is having a reaction to pregabalin. Reports the following sx while on med: tremors, dropping items, spasms, tingling in hands/fingers constantly and legs.  She stopped taking medication 4 days ago d/t SE. She feels a lot better since stopping pregabalin. Wanting to know if there is an alternative Dr. Krista Blue would recommend. She cannot take gabapentin/had SE on this. Advised I will send message to Dr. Krista Blue and call her back once she provides recommendation.  She also sees Dr. Lurline Hare. Had appt last week with him but cx due to being sick. Rescheduled for 05/02/20.

## 2020-04-21 NOTE — Telephone Encounter (Signed)
EMG nerve conduction previously showed no large fiber peripheral neuropathy,  Lyrica is for symptomatic treatment only, she is also Cymbalta 30 mg daily, which has been helpful  The options are  1, stopped Lyrica, reevaluate her symptoms 2.  Higher dose of Cymbalta 60 mg daily 3.  Retry lower dose of gabapentin 100 mg 3 times daily or lower dose of Lyrica 25 mg 3 times a day

## 2020-04-21 NOTE — Telephone Encounter (Signed)
Pt. Called today to see if Dr. Krista Blue could switch her medication ( tregadalin) due to her having a reaction to it. Best call back is [phone]

## 2020-04-23 ENCOUNTER — Ambulatory Visit: Payer: 59 | Admitting: Physical Therapy

## 2020-04-23 ENCOUNTER — Telehealth: Payer: Self-pay

## 2020-04-23 NOTE — Telephone Encounter (Signed)
I called the only place that she gave me within her insurance specifications. I had to leave voicemails and they have not called me back. I called the patient and she is adamant about not going to [facility] and that she cannot do a virtual visit. I suggested getting someone in her family to try to help her set up a way to do virtual which is the only way Dr. Charlcie Cradle office does it now. She is going to reach out to someone in her family to see if they can help her figure out how to do virtual visits. She will call me back when she is ready.

## 2020-04-30 ENCOUNTER — Encounter: Payer: 59 | Admitting: Physical Therapy

## 2020-05-02 ENCOUNTER — Ambulatory Visit (INDEPENDENT_AMBULATORY_CARE_PROVIDER_SITE_OTHER): Payer: 59 | Admitting: Podiatry

## 2020-05-02 ENCOUNTER — Other Ambulatory Visit: Payer: Self-pay

## 2020-05-02 ENCOUNTER — Encounter: Payer: Self-pay | Admitting: Podiatry

## 2020-05-02 DIAGNOSIS — G609 Hereditary and idiopathic neuropathy, unspecified: Secondary | ICD-10-CM | POA: Diagnosis not present

## 2020-05-02 DIAGNOSIS — M722 Plantar fascial fibromatosis: Secondary | ICD-10-CM

## 2020-05-02 MED ORDER — TRAMADOL HCL 50 MG PO TABS: 50.0000 mg | ORAL_TABLET | Freq: Four times a day (QID) | ORAL | 0 refills | Status: DC | PRN

## 2020-05-05 ENCOUNTER — Encounter: Payer: 59 | Admitting: Physical Therapy

## 2020-05-06 ENCOUNTER — Other Ambulatory Visit: Payer: Self-pay | Admitting: Podiatry

## 2020-05-06 ENCOUNTER — Other Ambulatory Visit: Payer: Self-pay | Admitting: Primary Care

## 2020-05-06 DIAGNOSIS — G609 Hereditary and idiopathic neuropathy, unspecified: Secondary | ICD-10-CM

## 2020-05-06 DIAGNOSIS — J309 Allergic rhinitis, unspecified: Secondary | ICD-10-CM

## 2020-05-07 ENCOUNTER — Encounter: Payer: 59 | Admitting: Physical Therapy

## 2020-05-07 ENCOUNTER — Other Ambulatory Visit: Payer: Self-pay | Admitting: Gastroenterology

## 2020-05-11 DIAGNOSIS — M722 Plantar fascial fibromatosis: Secondary | ICD-10-CM | POA: Diagnosis not present

## 2020-05-11 MED ORDER — BETAMETHASONE SOD PHOS & ACET 6 (3-3) MG/ML IJ SUSP
3.0000 mg | Freq: Once | INTRAMUSCULAR | Status: AC
  Administered 2020-05-11: 3 mg via INTRA_ARTICULAR

## 2020-05-11 NOTE — Progress Notes (Signed)
Neuro  Subjective: 55 year old female presenting today for follow up evaluation of bilateral plantar fasciitis and idiopathic peripheral neuropathy.  Patient continues to have bilateral lower extremity severe pain.  Patient states that the Lyrica was causing restless legs and leg trembles.  She has since discontinued the Lyrica that was prescribed by the neurologist.  She is taking Cymbalta from her PCP.  She is very frustrated because the amount of pain that she is in and there has been no definitive diagnosis or alleviation of her symptoms.   Past Medical History:  Diagnosis Date  . Acquired pyloric stricture   . Bleeding duodenal ulcer   . Chronic esophagogastric ulcer   . Depression   . Duodenal obstruction   . Gastric outlet obstruction 01/19/2018  . GERD (gastroesophageal reflux disease)   . History of stomach ulcers   . Hypothyroidism   . Incisional hernia, without obstruction or gangrene   . Intractable vomiting   . Kidney stones   . Migraine   . Migraine headache    only1-2x/month since starting topamax  . Neuropathy   . Side pain 03/30/2018     Objective: Physical Exam General: The patient is alert and oriented x3 in no acute distress.  Dermatology: Skin is warm, dry and supple bilateral lower extremities. Negative for open lesions or macerations bilateral.   Vascular: Dorsalis Pedis and Posterior Tibial pulses palpable bilateral.  Capillary fill time is immediate to all digits.  Neurological: Epicritic and protective threshold intact bilateral.   Musculoskeletal: Tenderness to palpation to the plantar aspect of the bilateral heels along the plantar fascia. All other joints range of motion within normal limits bilateral. Strength 5/5 in all groups bilateral.   Assessment: 1. plantar fasciitis bilateral feet  2. Idiopathic peripheral neuropathy bilateral   Plan of Care:  1. Patient evaluated.  2. Injection of 0.5cc Celestone soluspan injected into the bilateral  heels.  3.  Patient has discontinued Lyrica.  She continues to take Cymbalta from her PCP 4.  Prescription for tramadol 50 mg every 6 hours as needed pain 5.  Appointment with pain management pending.  The patient is in chronic bilateral lower extremity severe pain.  The patient does have some moderate plantar fascial heel pain, however this is completely different and unassociated with the pain that she experiences to the bilateral legs that extend all the way up past the knees. 6.  Return to clinic as needed  Currently not working.  Currently caring for her mother.    Edrick Kins, DPM [facility]  Dr. Edrick Kins, DPM    [address], [place] [address]                Office [phone]  Fax [phone]

## 2020-05-12 ENCOUNTER — Encounter: Payer: 59 | Admitting: Physical Therapy

## 2020-05-13 ENCOUNTER — Telehealth (INDEPENDENT_AMBULATORY_CARE_PROVIDER_SITE_OTHER): Payer: 59 | Admitting: Family Medicine

## 2020-05-13 ENCOUNTER — Telehealth: Payer: Self-pay

## 2020-05-13 DIAGNOSIS — R0981 Nasal congestion: Secondary | ICD-10-CM

## 2020-05-13 DIAGNOSIS — R059 Cough, unspecified: Secondary | ICD-10-CM

## 2020-05-13 DIAGNOSIS — R067 Sneezing: Secondary | ICD-10-CM

## 2020-05-13 MED ORDER — BENZONATATE 100 MG PO CAPS: 100.0000 mg | ORAL_CAPSULE | Freq: Three times a day (TID) | ORAL | 0 refills | Status: DC | PRN

## 2020-05-13 NOTE — Telephone Encounter (Signed)
Per chart review tab pt has already had video visit with Dr Maudie Mercury. As FYI sending note to Dr Maudie Mercury and Gentry Fitz NP.

## 2020-05-13 NOTE — Telephone Encounter (Signed)
[facility] [facility] - Client TELEPHONE ADVICE RECORD [facility] Patient Name: Tanya Nguyen Gender: Female DOB: 1965-11-18 Age: 55 Y 78 M 16 D Return Phone Number: [phone] (Primary), [phone] (Secondary) Address: [address] City/State/Zip: [place] [address] Client [place] [facility] - Client Client Site [facility] - Day Physician Alma Friendly - NP Contact Type Call Who Is Calling Patient / Member / Family / Caregiver Call Type Triage / Clinical Relationship To Patient Self Return Phone Number [phone] (Primary) Chief Complaint Headache Reason for Call Symptomatic / Request for Health Information Initial Comment Has runny nose, headache and chest congestion. Translation No Nurse Assessment Nurse: Velta Addison, RN, Crystal Date/Time (Eastern Time): 05/13/2020 10:52:27 AM Confirm and document reason for call. If symptomatic, describe symptoms. ---Has runny nose, headache and chest congestion. [name] states it started a few days ago. [name] states she also has a sore throat, possibly from drainage. [name] states she checked her temp and states she did not have one. States it was 54 something, Does the patient have any new or worsening symptoms? ---Yes Will a triage be completed? ---Yes Related visit to physician within the last 2 weeks? ---No Does the PT have any chronic conditions? (i.e. diabetes, asthma, this includes High risk factors for pregnancy, etc.) ---Yes List chronic conditions. ---thyroid, depression, Is this a behavioral health or substance abuse call? ---No Guidelines Guideline Title Affirmed Question Affirmed Notes Nurse Date/Time (Eastern Time) COVID-19 - Diagnosed or Suspected [1] COVID-19 infection suspected by caller or triager AND [2] mild symptoms (cough, fever, or others) AND [3] has not gotten tested yet Velta Addison, RN, [place] 05/13/2020 10:54:52 AM Disp. Time Eilene Ghazi  Time) Disposition Final User 05/13/2020 11:02:49 AM Home Care Yes Parrott, RN, Crystal PLEASE NOTE: All timestamps contained within this report are represented as [place] Standard Time. CONFIDENTIALTY NOTICE: This fax transmission is intended only for the addressee. It contains information that is legally privileged, confidential or otherwise protected from use or disclosure. If you are not the intended recipient, you are strictly prohibited from reviewing, disclosing, copying using or disseminating any of this information or taking any action in reliance on or regarding this information. If you have received this fax in error, please notify us immediately by telephone so that we can arrange for its return to [place]. Phone: [phone], Toll-Free: [phone], Fax: [phone] Page: 2 of 2 Call Id: [id number] [name] Disagree/Comply Comply [name] Understands Yes PreDisposition Call Doctor Care Advice Given Per Guideline HOME CARE: * You should be able to treat this at home. CARE ADVICE given per COVID-19 - DIAGNOSED OR SUSPECTED (Adult) guideline. * You become worse * Chest pain or difficulty breathing occurs * Fever returns after being gone for 24 hours * Fever lasts over 3 days * Fever over 103 F (39.4 C) CALL BACK IF: Comments User: Hamilton Capri, RN Date/Time (Eastern Time): 05/13/2020 49:70:26 AM Eulah Citizen at office states she has televisit already set up for today at 1230. Explained that she is not complaining of SOB but can hear wheezing when she is coughing. Explained to pt to keep the appt she has today and explained how they would reach out to her and get in connected with doctor. Verbalized understanding. Referrals REFERRED TO PCP OFFICE

## 2020-05-13 NOTE — Telephone Encounter (Signed)
Noted  

## 2020-05-13 NOTE — Progress Notes (Signed)
Virtual Visit via Telephone Note  I connected with Tanya Nguyen on 05/13/20 at 12:40 PM EST by telephone and verified that I am speaking with the correct person using two identifiers.   I discussed the limitations, risks, security and privacy concerns of performing an evaluation and management service by telephone and the availability of in person appointments. I also discussed with the patient that there may be a patient responsible charge related to this service. The patient expressed understanding and agreed to proceed.  Location patient: home, [place] Location provider: work or home office Participants present for the call: patient, provider Patient did not have a visit with me in the prior 7 days to address this/these issue(s).   History of Present Illness:  Acute telemedicine visit for nasal congestion and cough: -Onset: a couple days ago -Symptoms include: nasal congestion, pnd, cough, sneezing, headache -Denies: CP, SOB, NVD, body aches, inability to eat/drink/get out of bed -reports before she got sick she was at [facility] and a woman was coughing and coughing all over a few days before she sick -Pertinent past medical history:seasonal allergies - takes zyrtec and flonase -Pertinent medication allergies: penicillin, sulfa, gabapentin -COVID-19 vaccine status:  J and J   Observations/Objective: Patient sounds cheerful and well on the phone. I do not appreciate any SOB. Speech and thought processing are grossly intact. Patient reported vitals:  Assessment and Plan:  Nasal congestion  Cough  Sneezing  -we discussed possible serious and likely etiologies, options for evaluation and workup, limitations of telemedicine visit vs in person visit, treatment, treatment risks and precautions. Pt prefers to treat via telemedicine empirically rather than in person at this moment.  Query allergic rhinitis, viral upper respiratory illness, possible Covid versus other.  She opted for trial  of switching her allergy pill from Zyrtec to Allegra, continuation of Flonase, nasal saline and Tessalon for cough.  Advised Covid testing and discussed options.  She plans to do home testing.  Advised prompt follow-up with her primary care office or through [facility] health via video visit if her Covid test is positive to discuss treatment.  Advise staying home while sick. Work/School slipped offered: provided in patient instructions   Scheduled follow up with PCP offered: She agrees to schedule follow-up if needed. Advised to seek prompt in person care if worsening, new symptoms arise, or if is not improving with treatment. Advised of options for inperson care in case PCP office not available. Did let the patient know that I only do telemedicine shifts for [place] on Tuesdays and Thursdays and advised a follow up visit with PCP or at an [facility] if has further questions or concerns.   Follow Up Instructions:  I did not refer this patient for an OV with me in the next 24 hours for this/these issue(s).  I discussed the assessment and treatment plan with the patient. The patient was provided an opportunity to ask questions and all were answered. The patient agreed with the plan and demonstrated an understanding of the instructions.   I spent 17 minutes on the date of this visit in the care of this patient. See summary of tasks completed to properly care for this patient in the detailed notes above which also included counseling of above, review of PMH, medications, allergies, evaluation of the patient and ordering and/or  instructing patient on testing and care options.     Tanya Kern, DO

## 2020-05-13 NOTE — Patient Instructions (Addendum)
   ---------------------------------------------------------------------------------------------------------------------------      WORK SLIP:  Patient Tanya Nguyen,  06-Mar-1966, was seen for a medical visit today, 05/13/20 . Please excuse from work for a COVID like illness. We advise 10 days minimum from the onset of symptoms (05/10/20) PLUS 1 day of no fever and improved symptoms. Will defer to employer for a sooner return to work if symptoms have resolved, it is greater than 5 days since the positive test and the patient can wear a high-quality, tight fitting mask such as N95 or KN95 at all times for an additional 5 days. Would also suggest COVID19 antigen testing is negative prior to return.  Sincerely: E-signature: Dr. Colin Benton, DO [facility] [place] Ph: [phone]   ------------------------------------------------------------------------------------------------------------------------------  HOME CARE TIPS:   -I sent the medication(s) we discussed to your pharmacy: Meds ordered this encounter  Medications  . benzonatate (TESSALON PERLES) 100 MG capsule    Sig: Take 1 capsule (100 mg total) by mouth 3 (three) times daily as needed.    Dispense:  20 capsule    Refill:  0     -can use tylenolif needed for fevers, aches and pains per instructions  -can use nasal saline a few times per day if you have nasal congestion; sometimes  a short course of Afrin nasal spray for 3 days can help with symptoms as well  -stay hydrated, drink plenty of fluids and eat small healthy meals - avoid dairy  -can take 1000 IU (42mcg) Vit D3 and 100-500 mg of Vit C daily per instructions  -If the Covid test is positive, schedule a follow up video visit with your primary care doctor or with [facility] [place] and check out the [facility] website for more information on home care, transmission and treatment for COVID19  -follow up with your doctor in 2-3 days unless improving and feeling  better  -stay home while sick, except to seek medical care, and if you have [place] ideally it would be best to stay home for a full 10 days since the onset of symptoms PLUS one day of no fever and feeling better. Wear a good mask (such as N95 or KN95) if around others to reduce the risk of transmission.  It was nice to meet you today, and I really hope you are feeling better soon. I help [facility] out with telemedicine visits on Tuesdays and Thursdays and am available for visits on those days. If you have any concerns or questions following this visit please schedule a follow up visit with your Primary Care doctor or seek care at a local urgent care clinic to avoid delays in care.    Seek in person care or schedule a follow up video visit promptly if your symptoms worsen, new concerns arise or you are not improving with treatment. Call 911 and/or seek emergency care if your symptoms are severe or life threatening.

## 2020-05-14 ENCOUNTER — Telehealth: Payer: Self-pay

## 2020-05-14 ENCOUNTER — Encounter: Payer: 59 | Admitting: Physical Therapy

## 2020-05-14 NOTE — Telephone Encounter (Signed)
Noted  

## 2020-05-14 NOTE — Telephone Encounter (Signed)
Pt already has video appt on 05/15/20 at 8:20 with Gentry Fitz NP.

## 2020-05-14 NOTE — Telephone Encounter (Signed)
[facility] - Client TELEPHONE ADVICE RECORD [facility] Patient Name: Tanya Nguyen Gender: Female DOB: 1965-05-01 Age: 55 Y 30 M 17 D Return Phone Number: [phone] (Primary), [phone] (Secondary) Address: [address] City/State/Zip: [place] [address] Client [facility] [facility] - Client Client Site [place] - Day Physician Alma Friendly - NP Contact Type Call Who Is Calling Patient / Member / Family / Caregiver Call Type Triage / Clinical Relationship To Patient Self Return Phone Number [phone] (Primary) Chief Complaint CHEST PAIN - pain, pressure, heaviness or tightness Reason for Call Symptomatic / Request for [place] states she is having weakness, light headedness, stuffy nose, cough, sore throat, achy/ pain in her chest and headache. [name] states she was given cough syrup yesterday. Translation No Nurse Assessment Nurse: Lenon Curt, RN, Melanie Date/Time (Eastern Time): 05/14/2020 11:32:16 AM Confirm and document reason for call. If symptomatic, describe symptoms. ---[name] states she is having weakness, light headedness, stuffy nose, cough, sore throat, achy/pain in her chest congestion and headache. [name] states she was given cough syrup yesterday. COVID test negative yesterday. Started about 4 days ago. No fever noticed. Does the patient have any new or worsening symptoms? ---Yes Will a triage be completed? ---Yes Related visit to physician within the last 2 weeks? ---No Does the PT have any chronic conditions? (i.e. diabetes, asthma, this includes High risk factors for pregnancy, etc.) ---No Is this a behavioral health or substance abuse call? ---No Guidelines Guideline Title Affirmed Question Affirmed Notes Nurse Date/Time (Eastern Time) Cough - Acute Non- Productive [1] Continuous (nonstop) coughing interferes with work or school AND  [2] no improvement using cough treatment per Care Advice Lenon Curt, RN, [facility] 05/14/2020 11:35:42 AM Disp. Time Eilene Ghazi Time) Disposition Final User 05/14/2020 11:31:11 AM Send to Urgent Queue Peggye Fothergill PLEASE NOTE: All timestamps contained within this report are represented as [place] Standard Time. CONFIDENTIALTY NOTICE: This fax transmission is intended only for the addressee. It contains information that is legally privileged, confidential or otherwise protected from use or disclosure. If you are not the intended recipient, you are strictly prohibited from reviewing, disclosing, copying using or disseminating any of this information or taking any action in reliance on or regarding this information. If you have received this fax in error, please notify us immediately by telephone so that we can arrange for its return to [place]. Phone: [phone], Toll-Free: [phone], Fax: [phone] Page: 2 of 2 Call Id: [id number] 05/14/2020 11:37:50 AM See PCP within 24 Hours Yes Lenon Curt, RN, Donnajean Lopes Disagree/Comply Comply [name] Understands Yes PreDisposition Call Doctor Care Advice Given Per Guideline SEE PCP WITHIN 24 HOURS: * IF OFFICE WILL BE OPEN: You need to be examined within the next 24 hours. Call your doctor (or NP/PA) when the office opens and make an appointment. CARE ADVICE given per Cough - Acute Non-Productive (Adult) guideline. * You become worse * Difficulty breathing occurs CALL BACK IF: Comments User: Erik Obey, RN Date/Time (Eastern Time): 05/14/2020 11:35:29 AM Yesterday called and got the cough syrup from [place] and needed a covid test to be able to come in for assessment. User: Erik Obey, RN Date/Time Eilene Ghazi Time): 05/14/2020 11:38:45 AM Feels like something needs to come up. Requesting antibiotics because that usually knocks this out. Referrals REFERRED TO PCP OFFICE

## 2020-05-15 ENCOUNTER — Other Ambulatory Visit: Payer: Self-pay

## 2020-05-15 ENCOUNTER — Encounter: Payer: Self-pay | Admitting: Primary Care

## 2020-05-15 ENCOUNTER — Telehealth: Payer: 59 | Admitting: Primary Care

## 2020-05-15 ENCOUNTER — Ambulatory Visit
Admission: RE | Admit: 2020-05-15 | Discharge: 2020-05-15 | Disposition: A | Discharge status: Home / Self Care | Payer: 59 | Source: Ambulatory Visit | Attending: Primary Care | Admitting: Primary Care

## 2020-05-15 ENCOUNTER — Ambulatory Visit
Admission: RE | Admit: 2020-05-15 | Discharge: 2020-05-15 | Disposition: A | Discharge status: Home / Self Care | Payer: 59 | Attending: Primary Care | Admitting: Primary Care

## 2020-05-15 ENCOUNTER — Telehealth (INDEPENDENT_AMBULATORY_CARE_PROVIDER_SITE_OTHER): Payer: 59 | Admitting: Primary Care

## 2020-05-15 VITALS — Ht 59.0 in | Wt 118.0 lb

## 2020-05-15 DIAGNOSIS — R059 Cough, unspecified: Secondary | ICD-10-CM | POA: Insufficient documentation

## 2020-05-15 NOTE — Progress Notes (Signed)
Patient ID: Tanya Nguyen, female    DOB: Jan 16, 1966, 55 y.o.   MRN: [id number]  Virtual visit completed through telephone, a video enabled telemedicine application. Due to national recommendations of social distancing due to COVID-19, a virtual visit is felt to be most appropriate for this patient at this time. Reviewed limitations, risks, security and privacy concerns of performing a virtual visit and the availability of in person appointments. I also reviewed that there may be a patient responsible charge related to this service. The patient agreed to proceed.   Unfortunately she could figure out how to complete the visit via video so we conducted our visit via phone which lasted 5 min and 22 sec.  Patient location: home Provider location: [place] at [facility], office Persons participating in this virtual visit: patient, provider  If any vitals were documented, they were collected by patient at home unless specified below.    Ht 4\' 11"  (1.499 m)   Wt 118 lb (53.5 kg)   BMI 23.83 kg/m    CC: Cough Subjective:   HPI: Tanya Nguyen is a 55 y.o. female presenting on 05/15/2020 for Fatigue (With headache, sore throat and congestion x 1 week. Had fever yesterday )  Tanya Nguyen is a 55 year old female with a history of pneumonia, hypothyroidism, migraines, bipolar disorder, peripheral neuropathy who presents today with a chief complaint of cough.  Evaluated by Dr. Maudie Mercury on 05/13/20, two days ago, for several days of cough and nasal congestion, prior to symptom onset had been exposed to someone who was coughing. She completed one J&J Covid-19 vaccine. During this visit it was documented that she sounded well on the phone, no SOB. She was treated with conservative measures, was advised to go for Covid-19 testing.  Today she endorses headache, sore throat, cough, chest congestion, fatigue. Symptoms originally began 8 days ago. She completed a Covid-19 test two days ago at [facility] which was  negative. She endorses a fever of 100 last night.   She's been taking Day and Night time Sinus without much improvement. Her husband has no symptoms.       Relevant past medical, surgical, family and social history reviewed and updated as indicated. Interim medical history since our last visit reviewed. Allergies and medications reviewed and updated. Outpatient Medications Prior to Visit  Medication Sig Dispense Refill  . benzonatate (TESSALON PERLES) 100 MG capsule Take 1 capsule (100 mg total) by mouth 3 (three) times daily as needed. 20 capsule 0  . cetirizine (ZYRTEC) 10 MG tablet TAKE 1 TABLET BY MOUTH EVERY DAY AS NEEDED FOR ALLERGY 90 tablet 1  . cyclobenzaprine (FLEXERIL) 5 MG tablet TAKE 1/2 TO 1 TABLET (2.5-5 MG TOTAL) BY MOUTH DAILY AS NEEDED (FOOT PAIN). 30 tablet 0  . DULoxetine (CYMBALTA) 30 MG capsule TAKE 1 CAPSULE (30 MG TOTAL) BY MOUTH DAILY. FOR PAIN AND ANXIETY. (Patient taking differently: Take 60 mg by mouth daily.) 90 capsule 0  . fluticasone (FLONASE) 50 MCG/ACT nasal spray Place 1 spray into both nostrils 2 (two) times daily. 16 g 0  . levothyroxine (SYNTHROID) 88 MCG tablet TAKE 1 TABLET BY MOUTH EVERY MORNING ON EMPTY STOMACH WITH WATER ONLY-NO FOOD OR OTHER MED X 30 MIN 90 tablet 3  . oxybutynin (DITROPAN-XL) 5 MG 24 hr tablet Take 1 tablet (5 mg total) by mouth at bedtime. For bladder incontinence. 90 tablet 0  . pantoprazole (PROTONIX) 40 MG tablet TAKE 1 TABLET BY MOUTH EVERY DAY --CALL OFFICE TO  SCHEDULE A FOLLOW UP APPOINTMENT 30 tablet 0  . SUMAtriptan (IMITREX) 25 MG tablet TAKE 1 TABLET BY MOUTH (MAY REPEAT IN 2 HOURS IF HEADACHE PERSISTS OR RECURS.) 9 tablet 0  . topiramate (TOPAMAX) 50 MG tablet Take 1 tablet (50 mg total) by mouth at bedtime. For migraine prevention. 90 tablet 1  . traMADol (ULTRAM) 50 MG tablet TAKE 1 TABLET BY MOUTH EVERY 6 HOURS AS NEEDED FOR UP TO 5 DAYS. 30 tablet 0  . traZODone (DESYREL) 50 MG tablet Take 3 tablets (150 mg total) by  mouth at bedtime. 270 tablet 1   No facility-administered medications prior to visit.     Per HPI unless specifically indicated in ROS section below Review of Systems Objective:  Ht 4\' 11"  (1.499 m)   Wt 118 lb (53.5 kg)   BMI 23.83 kg/m   Wt Readings from Last 3 Encounters:  05/15/20 118 lb (53.5 kg)  03/19/20 118 lb (53.5 kg)  03/13/20 105 lb (47.6 kg)       Physical exam: Gen: alert, NAD, sounds congested. Congested cough noted once. Pulm: speaks in complete sentences without increased work of breathing Psych: normal mood, normal thought content      Results for orders placed or performed during the hospital encounter of 04/02/20  I-STAT creatinine  Result Value Ref Range   Creatinine, Ser 0.90 0.44 - 1.00 mg/dL   Assessment & Plan:   Problem List Items Addressed This Visit   None   Visit Diagnoses    Cough    -  Primary   Relevant Orders   DG Chest 2 View        I discussed the assessment and treatment plan with the patient. The patient was provided an opportunity to ask questions and all were answered. The patient agreed with the plan and demonstrated an understanding of the instructions. The patient was advised to call back or seek an in-person evaluation if the symptoms worsen or if the condition fails to improve as anticipated.  Follow up plan:  Please go to [facility] outpatient imaging center to have the chest x-ray done today.  I will be in touch later today with your results.  Pleas Koch, NP

## 2020-05-15 NOTE — Patient Instructions (Signed)
Please go to [facility] outpatient imaging center to have the chest x-ray done today.  I will be in touch later today with your results.

## 2020-05-15 NOTE — Assessment & Plan Note (Signed)
Acute for the last 8 days, evaluated 2 days ago via phone and appears to have had a grossly unremarkable exam.  History of community-acquired pneumonia, so we will proceed with chest x-ray today.  We will defer treatment until this has been completed.  Negative Covid test 2 days ago, consider repeat testing if chest x-ray negative.

## 2020-05-16 ENCOUNTER — Telehealth: Payer: Self-pay | Admitting: *Deleted

## 2020-05-16 ENCOUNTER — Other Ambulatory Visit: Payer: Self-pay | Admitting: Primary Care

## 2020-05-16 DIAGNOSIS — R059 Cough, unspecified: Secondary | ICD-10-CM

## 2020-05-16 MED ORDER — GUAIFENESIN-CODEINE 100-10 MG/5ML PO SYRP: 5.0000 mL | ORAL_SOLUTION | Freq: Three times a day (TID) | ORAL | 0 refills | Status: DC | PRN

## 2020-05-16 NOTE — Telephone Encounter (Signed)
Noted, will watch out for xray results.  Will notify patient when results have been reviewed.

## 2020-05-16 NOTE — Telephone Encounter (Signed)
Tanya Nguyen called in asking if her xray is back. I advise Tanya Nguyen it isn't back yet and once it is PCP/ assistant will call back to discuss results with her. Tanya Nguyen said she is also asking for "stronger" cough medication, something with codeine in it. Tanya Nguyen said her cough and congestion is worse and the tessalon isn't helping at all. I advise Tanya Nguyen I would send the message to PCP and they will f/u with her  [facility] [address]

## 2020-05-16 NOTE — Telephone Encounter (Signed)
See result note, can you call her?

## 2020-05-16 NOTE — Telephone Encounter (Signed)
Pt has called the scheduler line 2 additional times asking the status of her phone call, she would like a follow up call before the end of day. Pt said she doesn't know what to do about her cough and congestion and wants med she requested in 1st message before she "ends up in the ER or an Urgent care"

## 2020-05-19 NOTE — Telephone Encounter (Signed)
Patient is really hard to understand. Not sure what she is needing. She states that she is still not feeling well. She is wanting to be seen in the office. She went to a walk in clinic and they gave her an antibiotic. Please call the patient to discuss what she is needing being it is unclear what she is needing . EM

## 2020-05-19 NOTE — Telephone Encounter (Signed)
Called patient states she was seen at walking on Friday tested neg for covid and was told she has sinus infection and was started on amoxicillin bid for 7 days. She states that she is still very weak and not feeling well. Wanted to know how she needs to get strength back.

## 2020-05-20 NOTE — Telephone Encounter (Signed)
I am happy to see her whenever we can given those symptoms.  She's tested negative for Covid.

## 2020-05-20 NOTE — Telephone Encounter (Signed)
Called patient and lvm to call back and schedule. Ok to come in as per [facility] for sinus infection symptoms.

## 2020-05-20 NOTE — Telephone Encounter (Signed)
Viewed schedule and pt scheduled for 3/17

## 2020-05-22 ENCOUNTER — Other Ambulatory Visit: Payer: Self-pay

## 2020-05-22 ENCOUNTER — Ambulatory Visit: Payer: 59 | Admitting: Primary Care

## 2020-05-22 DIAGNOSIS — R059 Cough, unspecified: Secondary | ICD-10-CM

## 2020-05-22 DIAGNOSIS — G609 Hereditary and idiopathic neuropathy, unspecified: Secondary | ICD-10-CM | POA: Diagnosis not present

## 2020-05-22 NOTE — Patient Instructions (Signed)
Only take two of the 30 mg of duloxetine (Cymbalta) for foot pain.  Continue Flonase, Zyrtec, cough medication as needed.  It was a pleasure to see you today!

## 2020-05-22 NOTE — Assessment & Plan Note (Signed)
Improving gradually, still suspect that she had viral etiology, especially since she's only slightly better after antibiotics. Exam today stable.   Discussed conservative treatment including Neti Pot rinses, Flonase, Zyrtec.   Chest xray from last week without pneumonia.

## 2020-05-22 NOTE — Assessment & Plan Note (Signed)
Following with neurology who has her on 60 mg of Cymbalta daily. She will notify us when she's running low on her 30 mg capsule, we will send 60 mg at that point.   The increased dose has shown improvement in her pain. Allergy to Lyrica and gabapentin.

## 2020-05-22 NOTE — Progress Notes (Signed)
Subjective:    Patient ID: Tanya Nguyen, female    DOB: [birth year], 55 y.o.   MRN: [id number]  HPI  Tanya Nguyen is a very pleasant 55 y.o. female with a history of CAP, migraines, hypothyroidism, Bipolar Disorder, PTSD who presents today   Initially evaluated by Dr. Maudie Mercury on 05/13/2020 for a several day history of nasal congestion, cough, sneezing, headache.  During this visit she decided to switch her allergy medication to Allegra from Zyrtec, continue Flonase and [employer]  She was evaluated by me via phone on 05/15/2020 for continued symptoms.  Given her history of pneumonia we obtained a chest x-ray which was negative for pneumonia or other abnormalities.  She had tested negative for Covid once several days prior, so we recommended she retest again.  Since her phone visits she tested negative for Covid-19 last week. She presented to [facility] on 05/16/20, prescribed "amoxicillin" for "sinus infection", she finished her last pill yesterday. Today she's feeling somewhat better, still feels "weak" with head pressure. She's taking the Cheratussin cough medication which has helped.   Review of Systems  Constitutional: Positive for fatigue. Negative for fever.  HENT: Positive for postnasal drip. Negative for sinus pressure.   Respiratory: Negative for cough.   Neurological: Positive for headaches.         Past Medical History:  Diagnosis Date  . Acquired pyloric stricture   . Bleeding duodenal ulcer   . Chronic esophagogastric ulcer   . Depression   . Duodenal obstruction   . Gastric outlet obstruction 01/19/2018  . GERD (gastroesophageal reflux disease)   . History of stomach ulcers   . Hypothyroidism   . Incisional hernia, without obstruction or gangrene   . Intractable vomiting   . Kidney stones   . Migraine   . Migraine headache    only1-2x/month since starting topamax  . Neuropathy   . Side pain 03/30/2018    Social History   Socioeconomic History  .  Marital status: Married    Spouse name: Not on file  . Number of children: 0  . Years of education: 46  . Highest education level: High school graduate  Occupational History  . Occupation: Homemaker  Tobacco Use  . Smoking status: Never Smoker  . Smokeless tobacco: Never Used  Vaping Use  . Vaping Use: Never used  Substance and Sexual Activity  . Alcohol use: Not Currently    Alcohol/week: 0.0 standard drinks  . Drug use: No  . Sexual activity: Yes    Birth control/protection: None  Other Topics Concern  . Not on file  Social History Narrative   Lives at home with her husband.   Right-handed.   Rare caffeine.   Social Determinants of Health   Financial Resource Strain: Not on file  Food Insecurity: Not on file  Transportation Needs: Not on file  Physical Activity: Not on file  Stress: Not on file  Social Connections: Not on file  Intimate Partner Violence: Not on file    Past Surgical History:  Procedure Laterality Date  . ABDOMINAL HYSTERECTOMY  2004   partial  . CHOLECYSTECTOMY    . COLONOSCOPY WITH PROPOFOL N/A 02/27/2018   Procedure: COLONOSCOPY WITH PROPOFOL;  Surgeon: [name], MD;  Location: [place];  Service: Endoscopy;  Laterality: N/A;  . ESOPHAGOGASTRODUODENOSCOPY (EGD) WITH PROPOFOL N/A 01/12/2018   Procedure: ESOPHAGOGASTRODUODENOSCOPY (EGD) WITH BIOPSIES;  Surgeon: [name], MD;  Location: [place];  Service: Endoscopy;  Laterality: N/A;  .  ESOPHAGOGASTRODUODENOSCOPY (EGD) WITH PROPOFOL N/A 01/25/2018   Procedure: ESOPHAGOGASTRODUODENOSCOPY (EGD) WITH PROPOFOL;  Surgeon: Jonathon Bellows, MD;  Location: [facility];  Service: Gastroenterology;  Laterality: N/A;  . ESOPHAGOGASTRODUODENOSCOPY (EGD) WITH PROPOFOL N/A 02/27/2018   Procedure: ESOPHAGOGASTRODUODENOSCOPY (EGD) WITH PROPOFOL;  Surgeon: [name], MD;  Location: [place];  Service: Endoscopy;  Laterality: N/A;  . INCISIONAL HERNIA REPAIR N/A 11/23/2018    Procedure: HERNIA REPAIR INCISIONAL;  Surgeon: Olean Ree, MD;  Location: [facility];  Service: General;  Laterality: N/A;  . INSERTION OF MESH N/A 11/23/2018   Procedure: INSERTION OF MESH;  Surgeon: Olean Ree, MD;  Location: [facility];  Service: General;  Laterality: N/A;  . LAPAROTOMY N/A 01/20/2018   Procedure: EXPLORATORY LAPAROTOMY;  Surgeon: Olean Ree, MD;  Location: [facility];  Service: General;  Laterality: N/A;  . TOOTH EXTRACTION      Family History  Problem Relation Age of Onset  . Cancer Mother   . Breast cancer Mother 103  . Lung cancer Father   . Alcohol abuse Sister   . Bipolar disorder Sister   . Alcohol abuse Maternal Grandmother   . Stroke Maternal Grandmother   . Stroke Paternal Grandmother     Allergies  Allergen Reactions  . Penicillins Shortness Of Breath, Diarrhea and Nausea And Vomiting    Has patient had a PCN reaction causing immediate rash, facial/tongue/throat swelling, SOB or lightheadedness with hypotension: yes Has patient had a PCN reaction causing severe rash involving mucus membranes or skin necrosis: no Has patient had a PCN reaction that required hospitalization no Has patient had a PCN reaction occurring within the last 10 years: about 10 years If all of the above answers are "NO", then may proceed with Cephalosporin use.   . Gabapentin     Hair loss  . Pregabalin     Tremors, dropped items, tingling  . Sulfa Antibiotics Nausea And Vomiting    Other reaction(s): VOMITING    Current Outpatient Medications on File Prior to Visit  Medication Sig Dispense Refill  . benzonatate (TESSALON PERLES) 100 MG capsule Take 1 capsule (100 mg total) by mouth 3 (three) times daily as needed. 20 capsule 0  . cetirizine (ZYRTEC) 10 MG tablet TAKE 1 TABLET BY MOUTH EVERY DAY AS NEEDED FOR ALLERGY 90 tablet 1  . cyclobenzaprine (FLEXERIL) 5 MG tablet TAKE 1/2 TO 1 TABLET (2.5-5 MG TOTAL) BY MOUTH DAILY AS NEEDED (FOOT PAIN). 30 tablet 0  . DULoxetine  (CYMBALTA) 30 MG capsule TAKE 1 CAPSULE (30 MG TOTAL) BY MOUTH DAILY. FOR PAIN AND ANXIETY. (Patient taking differently: Take 60 mg by mouth daily.) 90 capsule 0  . fluticasone (FLONASE) 50 MCG/ACT nasal spray Place 1 spray into both nostrils 2 (two) times daily. 16 g 0  . guaiFENesin-codeine (ROBITUSSIN AC) 100-10 MG/5ML syrup Take 5 mLs by mouth 3 (three) times daily as needed for cough or congestion. 75 mL 0  . levothyroxine (SYNTHROID) 88 MCG tablet TAKE 1 TABLET BY MOUTH EVERY MORNING ON EMPTY STOMACH WITH WATER ONLY-NO FOOD OR OTHER MED X 30 MIN 90 tablet 3  . oxybutynin (DITROPAN-XL) 5 MG 24 hr tablet Take 1 tablet (5 mg total) by mouth at bedtime. For bladder incontinence. 90 tablet 0  . pantoprazole (PROTONIX) 40 MG tablet TAKE 1 TABLET BY MOUTH EVERY DAY --CALL OFFICE TO SCHEDULE A FOLLOW UP APPOINTMENT 30 tablet 0  . SUMAtriptan (IMITREX) 25 MG tablet TAKE 1 TABLET BY MOUTH (MAY REPEAT IN 2 HOURS IF HEADACHE PERSISTS  OR RECURS.) 9 tablet 0  . topiramate (TOPAMAX) 50 MG tablet Take 1 tablet (50 mg total) by mouth at bedtime. For migraine prevention. 90 tablet 1  . traMADol (ULTRAM) 50 MG tablet TAKE 1 TABLET BY MOUTH EVERY 6 HOURS AS NEEDED FOR UP TO 5 DAYS. 30 tablet 0  . traZODone (DESYREL) 50 MG tablet Take 3 tablets (150 mg total) by mouth at bedtime. 270 tablet 1   No current facility-administered medications on file prior to visit.    BP 110/62   Pulse 97   Temp (!) 97.2 F (36.2 C) (Temporal)   Ht 4\' 11"  (1.499 m)   Wt 121 lb (54.9 kg)   SpO2 100%   BMI 24.44 kg/m  Objective:   Physical Exam Cardiovascular:     Rate and Rhythm: Normal rate and regular rhythm.  Pulmonary:     Effort: Pulmonary effort is normal.     Breath sounds: Normal breath sounds.  Musculoskeletal:     Cervical back: Neck supple.  Skin:    General: Skin is warm and dry.           Assessment & Plan:      This visit occurred during the SARS-CoV-2 public health emergency.  Safety  protocols were in place, including screening questions prior to the visit, additional usage of staff PPE, and extensive cleaning of exam room while observing appropriate contact time as indicated for disinfecting solutions.

## 2020-06-12 ENCOUNTER — Other Ambulatory Visit: Payer: Self-pay | Admitting: Primary Care

## 2020-06-12 DIAGNOSIS — R32 Unspecified urinary incontinence: Secondary | ICD-10-CM

## 2020-06-17 ENCOUNTER — Other Ambulatory Visit: Payer: Self-pay | Admitting: Primary Care

## 2020-06-17 DIAGNOSIS — F3130 Bipolar disorder, current episode depressed, mild or moderate severity, unspecified: Secondary | ICD-10-CM

## 2020-06-17 DIAGNOSIS — G609 Hereditary and idiopathic neuropathy, unspecified: Secondary | ICD-10-CM

## 2020-06-17 DIAGNOSIS — M722 Plantar fascial fibromatosis: Secondary | ICD-10-CM

## 2020-06-17 NOTE — Telephone Encounter (Signed)
Received refill request for duloxetine 30 mg, but I believe she's taking 60 mg. I can send in the 60 mg tablet so she only has to take one and not two, please ensure she understands only to take ONE of these when I send it in.   If she understands then discontinue the duloxetine 30 mg, send in duloxetine 60 mg. Take 1 capsule by mouth once daily for anxiety, depression, pain. #90, 3 refills.

## 2020-07-01 ENCOUNTER — Other Ambulatory Visit: Payer: Self-pay

## 2020-07-01 ENCOUNTER — Telehealth (INDEPENDENT_AMBULATORY_CARE_PROVIDER_SITE_OTHER): Payer: 59 | Admitting: Family Medicine

## 2020-07-01 ENCOUNTER — Encounter: Payer: Self-pay | Admitting: Family Medicine

## 2020-07-01 VITALS — Wt 115.0 lb

## 2020-07-01 DIAGNOSIS — R519 Headache, unspecified: Secondary | ICD-10-CM | POA: Diagnosis not present

## 2020-07-01 NOTE — Progress Notes (Signed)
Virtual Visit via Telephone Note  I connected with Tanya Nguyen on 07/01/20 at  1:20 PM EDT by telephone and verified that I am speaking with the correct person using two identifiers.   I discussed the limitations, risks, security and privacy concerns of performing an evaluation and management service by telephone and the availability of in person appointments. I also discussed with the patient that there may be a patient responsible charge related to this service. The patient expressed understanding and agreed to proceed.  Location patient: home, [place] Location provider: work or home office Participants present for the call: patient, provider Patient did not have a visit with me in the prior 7 days to address this/these issue(s).   History of Present Illness:  Acute telemedicine visit for "severe headache": -Onset: 3 days ago, constant, worsening -Symptoms include: pain is is on the top of her head "in the middle of my brain",  eyes are "killing me", nausea -Denies:fevers, chills, vomiting, diarrhea, other neuro symptoms, respiratory symptoms -reports this is worse than any headache she had ever had - "very very severe" -Has tried: reports has been taking imitrex but it is not working - reports has rare migraines usually resolved with imitrex -Pertinent past medical history: Migraines, neuropathy, GERD, Hypothyroid, bipoloar d/o -Pertinent medication allergies: penicillin, gabapentin, pregabalin, sulfa -COVID-19 vaccine status: J and J   Observations/Objective: Patient sounds cheerful and well on the phone. I do not appreciate any SOB. Speech and thought processing are grossly intact. Patient reported vitals:  Assessment and Plan:  Acute intractable headache, unspecified headache type  -we discussed possible serious and likely etiologies, options for evaluation and workup, limitations of telemedicine visit vs in person visit, treatment, treatment risks and precautions. Pt prefers to  treat via telemedicine empirically rather than in person at this moment.  Given the reported severity of her headache, advised immediate in person evaluation.  Discussed options for in person care and she agrees to seek care immediately.  She prefers to go by private vehicle and reports her husband will drive her.  They plan to go to the urgent care/emergency room near where they live. Advised to seek prompt in person care if worsening, new symptoms arise, or if is not improving with treatment. Advised of options for inperson care in case PCP office not available. Did let the patient know that I only do telemedicine shifts for [facility] on Tuesdays and Thursdays and advised a follow up visit with PCP or at an [facility] if has further questions or concerns.   Follow Up Instructions:  I did not refer this patient for an OV with me in the next 24 hours for this/these issue(s).  I discussed the assessment and treatment plan with the patient. The patient was provided an opportunity to ask questions and all were answered. The patient agreed with the plan and demonstrated an understanding of the instructions.   I spent 15 minutes on the date of this visit in the care of this patient. See summary of tasks completed to properly care for this patient in the detailed notes above which also included counseling of above, review of PMH, medications, allergies, evaluation of the patient and ordering and/or  instructing patient on testing and care options.     Tanya Kern, DO

## 2020-07-01 NOTE — Patient Instructions (Signed)
Please seek immediate in person care as we discussed.  Call 911 if you are having worsening symptoms or unable to drive to the appointment.

## 2020-07-01 NOTE — Telephone Encounter (Signed)
Left message to return call to our office.  

## 2020-07-05 NOTE — Telephone Encounter (Signed)
Called patient reviewed all information and repeated back to me. Will call if any questions.  She will cal if any issues.

## 2020-07-07 ENCOUNTER — Telehealth (INDEPENDENT_AMBULATORY_CARE_PROVIDER_SITE_OTHER): Payer: 59 | Admitting: Family Medicine

## 2020-07-07 ENCOUNTER — Encounter: Payer: Self-pay | Admitting: Family Medicine

## 2020-07-07 VITALS — Ht 59.0 in

## 2020-07-07 DIAGNOSIS — J3489 Other specified disorders of nose and nasal sinuses: Secondary | ICD-10-CM | POA: Diagnosis not present

## 2020-07-07 DIAGNOSIS — R059 Cough, unspecified: Secondary | ICD-10-CM | POA: Diagnosis not present

## 2020-07-07 MED ORDER — PROMETHAZINE-DM 6.25-15 MG/5ML PO SYRP: 5.0000 mL | ORAL_SOLUTION | Freq: Every evening | ORAL | 0 refills | Status: DC | PRN

## 2020-07-07 MED ORDER — PREDNISONE 20 MG PO TABS: ORAL_TABLET | ORAL | 0 refills | Status: DC

## 2020-07-07 NOTE — Progress Notes (Addendum)
TELEPHONE VISIT  Due to national recommendations of social distancing due to [place] 19, Audio telehealth visit is felt to be most appropriate for this patient at this time.   I connected with Tanya Nguyen on 07/07/20 at  9:40 AM EDT by telephone and verified that I am speaking with the correct person using two identifiers.   Interactive audio and video telecommunications were attempted between this provider and patient, however failed, due to patient having technical difficulties OR patient did not have access to video capability.  We continued and completed visit with audio only.   I discussed the limitations, risks, security and privacy concerns of performing an evaluation and management service by telephone and the availability of in person appointments. I also discussed with the patient that there may be a patient responsible charge related to this service. The patient expressed understanding and agreed to proceed.  Patient location: Home Provider Location: [facility] Participants: Eliezer Lofts and Tanya Nguyen  Chief Complaint  Patient presents with  . Cough    Seen at Urgent Care 07/01/2020-Still symptomatic  . Nasal Congestion  . Facial Pain    History of Present Illness:  55 year old female presents with new onset cough x 2 weeks.  She has had headache, sneeze and pressure in face and forehead.  Seen on 4/26 at Urgent care. Treated for acute sinus infection... started on doxycycline 100 mg BID x 7 days. Reviewed note from Dr. Maudie Mercury virtual visit recommending in person OV on same day.  Also started on nasal spray flonase 2 sprays per nostril daily. Also given nausea medication.   Symptoms have progressed to cough.... no SOB, no wheeze. Cough keeping her up at night.  Facial pressure is improving some but is on last dose of antibiotics.  Post nasal drip. She is sneezing.  No fever.   COVID 19 screen COVID testing:  none COVID vaccine: JJ 06/18/2019 COVID exposure: No  recent travel or known exposure to [place]  The importance of social distancing was discussed today.    Review of Systems  Constitutional: Negative for chills and fever.  HENT: Positive for congestion and sinus pain. Negative for ear pain.   Eyes: Negative for pain and redness.  Respiratory: Positive for cough. Negative for shortness of breath.   Cardiovascular: Negative for chest pain, palpitations and leg swelling.  Gastrointestinal: Negative for abdominal pain, blood in stool, constipation, diarrhea, nausea and vomiting.  Genitourinary: Negative for dysuria.  Musculoskeletal: Negative for falls and myalgias.  Skin: Negative for rash.  Neurological: Negative for dizziness.  Psychiatric/Behavioral: Negative for depression. The patient is not nervous/anxious.       Past Medical History:  Diagnosis Date  . Acquired pyloric stricture   . Bleeding duodenal ulcer   . Chronic esophagogastric ulcer   . Depression   . Duodenal obstruction   . Gastric outlet obstruction 01/19/2018  . GERD (gastroesophageal reflux disease)   . History of stomach ulcers   . Hypothyroidism   . Incisional hernia, without obstruction or gangrene   . Intractable vomiting   . Kidney stones   . Migraine   . Migraine headache    only1-2x/month since starting topamax  . Neuropathy   . Side pain 03/30/2018    reports that she has never smoked. She has never used smokeless tobacco. She reports previous alcohol use. She reports that she does not use drugs.   Current Outpatient Medications:  .  cetirizine (ZYRTEC) 10 MG tablet, TAKE 1 TABLET BY  MOUTH EVERY DAY AS NEEDED FOR ALLERGY, Disp: 90 tablet, Rfl: 1 .  cyclobenzaprine (FLEXERIL) 5 MG tablet, TAKE 1/2 TO 1 TABLET (2.5-5 MG TOTAL) BY MOUTH DAILY AS NEEDED (FOOT PAIN)., Disp: 30 tablet, Rfl: 0 .  doxycycline (VIBRAMYCIN) 100 MG capsule, Take 1 capsule by mouth 2 (two) times daily., Disp: , Rfl:  .  DULoxetine (CYMBALTA) 30 MG capsule, TAKE 1 CAPSULE (30 MG  TOTAL) BY MOUTH DAILY. FOR PAIN AND ANXIETY., Disp: 90 capsule, Rfl: 0 .  fluticasone (FLONASE) 50 MCG/ACT nasal spray, Place 1 spray into both nostrils 2 (two) times daily., Disp: 16 g, Rfl: 0 .  levothyroxine (SYNTHROID) 88 MCG tablet, TAKE 1 TABLET BY MOUTH EVERY MORNING ON EMPTY STOMACH WITH WATER ONLY-NO FOOD OR OTHER MED X 30 MIN, Disp: 90 tablet, Rfl: 3 .  ondansetron (ZOFRAN-ODT) 4 MG disintegrating tablet, Take 1 tablet by mouth every 8 (eight) hours as needed., Disp: , Rfl:  .  oxybutynin (DITROPAN-XL) 5 MG 24 hr tablet, TAKE 1 TABLET (5 MG TOTAL) BY MOUTH AT BEDTIME. FOR BLADDER INCONTINENCE., Disp: 90 tablet, Rfl: 0 .  pantoprazole (PROTONIX) 40 MG tablet, TAKE 1 TABLET BY MOUTH EVERY DAY --CALL OFFICE TO SCHEDULE A FOLLOW UP APPOINTMENT, Disp: 30 tablet, Rfl: 0 .  SUMAtriptan (IMITREX) 25 MG tablet, TAKE 1 TABLET BY MOUTH (MAY REPEAT IN 2 HOURS IF HEADACHE PERSISTS OR RECURS.), Disp: 9 tablet, Rfl: 0 .  topiramate (TOPAMAX) 50 MG tablet, Take 1 tablet (50 mg total) by mouth at bedtime. For migraine prevention., Disp: 90 tablet, Rfl: 1 .  traMADol (ULTRAM) 50 MG tablet, TAKE 1 TABLET BY MOUTH EVERY 6 HOURS AS NEEDED FOR UP TO 5 DAYS., Disp: 30 tablet, Rfl: 0 .  traZODone (DESYREL) 50 MG tablet, Take 3 tablets (150 mg total) by mouth at bedtime., Disp: 270 tablet, Rfl: 1 .  guaiFENesin-codeine (ROBITUSSIN AC) 100-10 MG/5ML syrup, Take 5 mLs by mouth 3 (three) times daily as needed for cough or congestion. (Patient not taking: Reported on 07/07/2020), Disp: 75 mL, Rfl: 0   Observations/Objective: Height 4\' 11"  (1.499 m).  Physical Exam Constitutional:      General: The patient is not in acute distress. Pulmonary:     Effort: Pulmonary effort is normal. No respiratory distress.  Neurological:     Mental Status: The patient is alert and oriented to person, place, and time.  Psychiatric:        Mood and Affect: Mood normal.        Behavior: Behavior normal.     Assessment and  Plan  Problem List Items Addressed This Visit    Cough    Cough suppressant prn.      Sinus pressure - Primary     On last day of doxycycline 7 days... improving sinus pressure but not resolved.  Will add treatment with prednisone taper... if not improving symptoms in next 24-48 hours she can call with update for consideration of antibiotics.           ALSO recommended COVID testing given she has not done this and it could clarify cause of symptoms.  I discussed the assessment and treatment plan with the patient. The patient was provided an opportunity to ask questions and all were answered. The patient agreed with the plan and demonstrated an understanding of the instructions.   The patient was advised to call back or seek an in-person evaluation if the symptoms worsen or if the condition fails to improve as anticipated.  I provided 13 minutes of non-face-to-face time during this encounter.   Eliezer Lofts, MD

## 2020-07-07 NOTE — Assessment & Plan Note (Signed)
Cough suppressant prn.

## 2020-07-07 NOTE — Assessment & Plan Note (Signed)
On last day of doxycycline 7 days... improving sinus pressure but not resolved.  Will add treatment with prednisone taper... if not improving symptoms in next 24-48 hours she can call with update for consideration of antibiotics.

## 2020-07-09 ENCOUNTER — Encounter: Payer: Self-pay | Admitting: Registered Nurse

## 2020-07-09 ENCOUNTER — Other Ambulatory Visit: Payer: Self-pay

## 2020-07-09 ENCOUNTER — Telehealth: Payer: 59 | Admitting: Registered Nurse

## 2020-07-09 DIAGNOSIS — J069 Acute upper respiratory infection, unspecified: Secondary | ICD-10-CM | POA: Diagnosis not present

## 2020-07-09 MED ORDER — LEVOFLOXACIN 500 MG PO TABS: 500.0000 mg | ORAL_TABLET | Freq: Every day | ORAL | 0 refills | Status: DC

## 2020-07-09 NOTE — Progress Notes (Signed)
Telemedicine Encounter- SOAP NOTE Established Patient  This telephone encounter was conducted with the patient's (or proxy's) verbal consent via audio telecommunications: yes  Patient was instructed to have this encounter in a suitably private space; and to only have persons present to whom they give permission to participate. In addition, patient identity was confirmed by use of name plus two identifiers (DOB and address).  I discussed the limitations, risks, security and privacy concerns of performing an evaluation and management service by telephone and the availability of in person appointments. I also discussed with the patient that there may be a patient responsible charge related to this service. The patient expressed understanding and agreed to proceed.  I spent a total of 15 minutes talking with the patient or their proxy.  Provider in office Patient at home   Participants: Kathrin Ruddy, NP and [name]  Chief Complaint  Patient presents with  . Headache    Patient states she has been having a sinus infection for 3 weeks ago. 2nd week went to a walk-in gave here antibiotic and didn't feel better. 3rd week she had a headache with the same sinus infection and was given a shot in the hip and prednisone.Patient states she has had two covid test that were negative but would like to try another antibiotic to feel better because her face is hurting and symptoms not better.    Subjective   Charnese Federici is a 55 y.o. established patient. Telephone visit today for ongoing sinus pressure  HPI Ongoing for the past three weeks Has been on course of doxycycline, now on prednisone taper Temporary improvement with doxycycline but now it is back All facial pressure and pain, no other headache, denies further neuro changes Sinus congestion and runny nose, some pnd with cough. No lower respiratory congestion Did get COVID testing x 2, both negative. Has rec'd one dose J+J in early  2021.  Otherwise no new concerns. Continues OTC allergy meds  Patient Active Problem List   Diagnosis Date Noted  . Sinus pressure 07/07/2020  . Cough 05/15/2020  . Bilateral foot pain 03/13/2020  . Chronic pain syndrome 03/13/2020  . CAP (community acquired pneumonia) 02/12/2020  . Paresthesia 12/13/2019  . Idiopathic peripheral neuropathy 07/23/2019  . Hair loss 09/22/2018  . Sacroiliac joint pain 08/14/2018  . Urge incontinence 08/11/2018  . Protein-calorie malnutrition, severe 01/20/2018  . Abdominal pain, generalized   . Vitamin D deficiency 07/28/2017  . Vitamin B 12 deficiency 07/28/2017  . Chronic low back pain 07/28/2017  . Plantar fascial fibromatosis of both feet 11/10/2016  . Preventative health care 09/16/2016  . Bipolar I disorder, most recent episode depressed ([place]) 03/01/2015  . PTSD (post-traumatic stress disorder) 02/27/2015  . Dysuria 08/26/2014  . Constipation 04/02/2014  . Fatigue 12/13/2013  . Insomnia 08/30/2013  . GERD (gastroesophageal reflux disease)   . Migraines   . Hypothyroidism     Past Medical History:  Diagnosis Date  . Acquired pyloric stricture   . Bleeding duodenal ulcer   . Chronic esophagogastric ulcer   . Depression   . Duodenal obstruction   . Gastric outlet obstruction 01/19/2018  . GERD (gastroesophageal reflux disease)   . History of stomach ulcers   . Hypothyroidism   . Incisional hernia, without obstruction or gangrene   . Intractable vomiting   . Kidney stones   . Migraine   . Migraine headache    only1-2x/month since starting topamax  . Neuropathy   . Side pain  03/30/2018    Current Outpatient Medications  Medication Sig Dispense Refill  . cetirizine (ZYRTEC) 10 MG tablet TAKE 1 TABLET BY MOUTH EVERY DAY AS NEEDED FOR ALLERGY 90 tablet 1  . cyclobenzaprine (FLEXERIL) 5 MG tablet TAKE 1/2 TO 1 TABLET (2.5-5 MG TOTAL) BY MOUTH DAILY AS NEEDED (FOOT PAIN). 30 tablet 0  . DULoxetine (CYMBALTA) 30 MG capsule TAKE 1  CAPSULE (30 MG TOTAL) BY MOUTH DAILY. FOR PAIN AND ANXIETY. 90 capsule 0  . fluticasone (FLONASE) 50 MCG/ACT nasal spray Place 1 spray into both nostrils 2 (two) times daily. 16 g 0  . levothyroxine (SYNTHROID) 88 MCG tablet TAKE 1 TABLET BY MOUTH EVERY MORNING ON EMPTY STOMACH WITH WATER ONLY-NO FOOD OR OTHER MED X 30 MIN 90 tablet 3  . ondansetron (ZOFRAN-ODT) 4 MG disintegrating tablet Take 1 tablet by mouth every 8 (eight) hours as needed.    Marland Kitchen oxybutynin (DITROPAN-XL) 5 MG 24 hr tablet TAKE 1 TABLET (5 MG TOTAL) BY MOUTH AT BEDTIME. FOR BLADDER INCONTINENCE. 90 tablet 0  . pantoprazole (PROTONIX) 40 MG tablet TAKE 1 TABLET BY MOUTH EVERY DAY --CALL OFFICE TO SCHEDULE A FOLLOW UP APPOINTMENT 30 tablet 0  . predniSONE (DELTASONE) 20 MG tablet 3 tabs by mouth daily x 3 days, then 2 tabs by mouth daily x 2 days then 1 tab by mouth daily x 2 days 15 tablet 0  . promethazine-dextromethorphan (PROMETHAZINE-DM) 6.25-15 MG/5ML syrup Take 5 mLs by mouth at bedtime as needed for cough. 118 mL 0  . SUMAtriptan (IMITREX) 25 MG tablet TAKE 1 TABLET BY MOUTH (MAY REPEAT IN 2 HOURS IF HEADACHE PERSISTS OR RECURS.) 9 tablet 0  . topiramate (TOPAMAX) 50 MG tablet Take 1 tablet (50 mg total) by mouth at bedtime. For migraine prevention. 90 tablet 1  . traMADol (ULTRAM) 50 MG tablet TAKE 1 TABLET BY MOUTH EVERY 6 HOURS AS NEEDED FOR UP TO 5 DAYS. 30 tablet 0  . traZODone (DESYREL) 50 MG tablet Take 3 tablets (150 mg total) by mouth at bedtime. 270 tablet 1   No current facility-administered medications for this visit.    Allergies  Allergen Reactions  . Penicillins Shortness Of Breath, Diarrhea and Nausea And Vomiting    Has patient had a PCN reaction causing immediate rash, facial/tongue/throat swelling, SOB or lightheadedness with hypotension: yes Has patient had a PCN reaction causing severe rash involving mucus membranes or skin necrosis: no Has patient had a PCN reaction that required hospitalization  no Has patient had a PCN reaction occurring within the last 10 years: about 10 years If all of the above answers are "NO", then may proceed with Cephalosporin use.   . Gabapentin     Hair loss  . Pregabalin     Tremors, dropped items, tingling  . Sulfa Antibiotics Nausea And Vomiting    Other reaction(s): VOMITING    Social History   Socioeconomic History  . Marital status: Married    Spouse name: Not on file  . Number of children: 0  . Years of education: 80  . Highest education level: High school graduate  Occupational History  . Occupation: Homemaker  Tobacco Use  . Smoking status: Never Smoker  . Smokeless tobacco: Never Used  Vaping Use  . Vaping Use: Never used  Substance and Sexual Activity  . Alcohol use: Not Currently    Alcohol/week: 0.0 standard drinks  . Drug use: No  . Sexual activity: Yes    Birth control/protection: None  Other  Topics Concern  . Not on file  Social History Narrative   Lives at home with her husband.   Right-handed.   Rare caffeine.   Social Determinants of Health   Financial Resource Strain: Not on file  Food Insecurity: Not on file  Transportation Needs: Not on file  Physical Activity: Not on file  Stress: Not on file  Social Connections: Not on file  Intimate Partner Violence: Not on file    ROS Per hpi   Objective   Vitals as reported by the patient: There were no vitals filed for this visit.  There are no diagnoses linked to this encounter.  PLAN  Start levaquin 500mg  PO qd for one week. Discussed r/b/se of this medication.  Continue OTC and supportive care  Refer to ENT if levaquin is ineffective  Dg maxillofacial in case of deeper etiology - would probably more benefit from scope by ENT  Patient encouraged to call clinic with any questions, comments, or concerns.  I discussed the assessment and treatment plan with the patient. The patient was provided an opportunity to ask questions and all were answered.  The patient agreed with the plan and demonstrated an understanding of the instructions.   The patient was advised to call back or seek an in-person evaluation if the symptoms worsen or if the condition fails to improve as anticipated.  I provided 15 minutes of non-face-to-face time during this encounter.  Maximiano Coss, NP  Primary Care at [facility]

## 2020-07-09 NOTE — Patient Instructions (Signed)
° ° ° °  If you have lab work done today you will be contacted with your lab results within the next 2 weeks.  If you have not heard from us then please contact us. The fastest way to get your results is to register for My Chart. ° ° °IF you received an x-ray today, you will receive an invoice from [facility]. Please contact  Radiology at [phone] with questions or concerns regarding your invoice.  ° °IF you received labwork today, you will receive an invoice from [facility]. Please contact [facility] at [phone] with questions or concerns regarding your invoice.  ° °Our billing staff will not be able to assist you with questions regarding bills from these companies. ° °You will be contacted with the lab results as soon as they are available. The fastest way to get your results is to activate your My Chart account. Instructions are located on the last page of this paperwork. If you have not heard from us regarding the results in 2 weeks, please contact this office. °  ° ° ° °

## 2020-07-12 ENCOUNTER — Other Ambulatory Visit: Payer: Self-pay | Admitting: Primary Care

## 2020-07-12 DIAGNOSIS — G43001 Migraine without aura, not intractable, with status migrainosus: Secondary | ICD-10-CM

## 2020-07-14 ENCOUNTER — Telehealth: Payer: Self-pay | Admitting: Primary Care

## 2020-07-14 NOTE — Telephone Encounter (Signed)
Do you want to see her in office?

## 2020-07-14 NOTE — Telephone Encounter (Signed)
Tanya Nguyen called in and she has been having a headache and its been hurting and her whole head hurts and if she takes a Sumatriptan it stops and she only gets 9 pills and they cost 130$. They have put her 2 different type of antibiotics and levaquin  nothing working.   Please advise

## 2020-07-14 NOTE — Telephone Encounter (Signed)
Yes, patient needs in office evaluation with either myself or first available provider.

## 2020-07-16 ENCOUNTER — Other Ambulatory Visit: Payer: Self-pay

## 2020-07-16 ENCOUNTER — Encounter: Payer: Self-pay | Admitting: Family Medicine

## 2020-07-16 ENCOUNTER — Ambulatory Visit: Payer: 59 | Admitting: Family Medicine

## 2020-07-16 VITALS — BP 120/78 | HR 95 | Temp 98.0°F | Ht 59.0 in | Wt 120.4 lb

## 2020-07-16 DIAGNOSIS — G43001 Migraine without aura, not intractable, with status migrainosus: Secondary | ICD-10-CM | POA: Diagnosis not present

## 2020-07-16 DIAGNOSIS — G43111 Migraine with aura, intractable, with status migrainosus: Secondary | ICD-10-CM | POA: Diagnosis not present

## 2020-07-16 DIAGNOSIS — E038 Other specified hypothyroidism: Secondary | ICD-10-CM | POA: Diagnosis not present

## 2020-07-16 DIAGNOSIS — G609 Hereditary and idiopathic neuropathy, unspecified: Secondary | ICD-10-CM | POA: Diagnosis not present

## 2020-07-16 LAB — COMPREHENSIVE METABOLIC PANEL
ALT: 9 U/L (ref 0–35)
AST: 10 U/L (ref 0–37)
Albumin: 3.6 g/dL (ref 3.5–5.2)
Alkaline Phosphatase: 52 U/L (ref 39–117)
BUN: 23 mg/dL (ref 6–23)
CO2: 26 mEq/L (ref 19–32)
Calcium: 8.5 mg/dL (ref 8.4–10.5)
Chloride: 109 mEq/L (ref 96–112)
Creatinine, Ser: 0.8 mg/dL (ref 0.40–1.20)
GFR: 82.72 mL/min (ref >60.00)
Glucose, Bld: 97 mg/dL (ref 70–99)
Potassium: 3.8 mEq/L (ref 3.5–5.1)
Sodium: 141 mEq/L (ref 135–145)
Total Bilirubin: 0.2 mg/dL (ref 0.2–1.2)
Total Protein: 5.4 g/dL — ABNORMAL LOW (ref 6.0–8.3)

## 2020-07-16 LAB — CBC WITH DIFFERENTIAL/PLATELET
Basophils Absolute: 0 10*3/uL (ref 0.0–0.1)
Basophils Relative: 0.4 % (ref 0.0–3.0)
Eosinophils Absolute: 0 10*3/uL (ref 0.0–0.7)
Eosinophils Relative: 0.3 % (ref 0.0–5.0)
HCT: 39.3 % (ref 36.0–46.0)
Hemoglobin: 13 g/dL (ref 12.0–15.0)
Lymphocytes Relative: 23.6 % (ref 12.0–46.0)
Lymphs Abs: 2.2 10*3/uL (ref 0.7–4.0)
MCHC: 33 g/dL (ref 30.0–36.0)
MCV: 92.9 fl (ref 78.0–100.0)
Monocytes Absolute: 0.8 10*3/uL (ref 0.1–1.0)
Monocytes Relative: 8.7 % (ref 3.0–12.0)
Neutro Abs: 6.3 10*3/uL (ref 1.4–7.7)
Neutrophils Relative %: 67 % (ref 43.0–77.0)
Platelets: 230 10*3/uL (ref 150.0–400.0)
RBC: 4.23 Mil/uL (ref 3.87–5.11)
RDW: 15.3 % (ref 11.5–15.5)
WBC: 9.4 10*3/uL (ref 4.0–10.5)

## 2020-07-16 LAB — TSH: TSH: 2.09 u[IU]/mL (ref 0.35–4.50)

## 2020-07-16 MED ORDER — SUMATRIPTAN SUCCINATE 50 MG PO TABS: ORAL_TABLET | ORAL | 3 refills | Status: DC

## 2020-07-16 MED ORDER — CYCLOBENZAPRINE HCL 5 MG PO TABS
5.0000 mg | ORAL_TABLET | Freq: Two times a day (BID) | ORAL | 0 refills | Status: DC | PRN
Start: 2020-07-16 — End: 2021-04-02

## 2020-07-16 MED ORDER — TOPIRAMATE 100 MG PO TABS: 100.0000 mg | ORAL_TABLET | Freq: Every day | ORAL | 1 refills | Status: DC

## 2020-07-16 MED ORDER — KETOROLAC TROMETHAMINE 30 MG/ML IJ SOLN
30.0000 mg | Freq: Once | INTRAMUSCULAR | Status: AC
  Administered 2020-07-16: 30 mg via INTRAMUSCULAR

## 2020-07-16 MED ORDER — ONDANSETRON 4 MG PO TBDP: 4.0000 mg | ORAL_TABLET | Freq: Three times a day (TID) | ORAL | 0 refills | Status: DC | PRN

## 2020-07-16 MED ORDER — DEXAMETHASONE SODIUM PHOSPHATE 10 MG/ML IJ SOLN
10.0000 mg | Freq: Once | INTRAMUSCULAR | Status: AC
  Administered 2020-07-16: 10 mg via INTRAMUSCULAR

## 2020-07-16 NOTE — Patient Instructions (Addendum)
I think you have ongoing migraine.  Treat with toradol shot today to give some relief.  Increase topamax to 100mg  daily, increase imitrex to 50mg  as needed for migraine, may also use flexeril 5mg  muscle relaxant as needed for migraine. Let us know how this helps, schedule follow up visit with Anda Kraft in 3-4 weeks, sooner if worsening.

## 2020-07-16 NOTE — Addendum Note (Signed)
Addended by: Brenton Grills on: 5/36/6440 34:74 AM   Modules accepted: Orders

## 2020-07-16 NOTE — Assessment & Plan Note (Addendum)
Story/exam most consistent with status migrainosus - treat with toradol IM 30mg  today as well as dexamethasone 10mg  IM x1. Avoid phenergan as she drove herself.  For poorly controlled migraines - will increase topamax to 100mg  daily as well as increase imitrex to 50mg  PRN. Reviewed correct administration of medication (take at onset of migraine aura). Discussed flexeril abortively as well.  Update [place] with above effect, I asked her to f/u with PCP in 3-4 wks for migraine f/u visit, let us know sooner if not improving with treatment.  Check labs r/o other causes of worsening migraine.

## 2020-07-16 NOTE — Progress Notes (Addendum)
Patient ID: Tanya Nguyen, female    DOB: 12-24-1965, 55 y.o.   MRN: [id number]  This visit was conducted in person.  BP 120/78   Pulse 95   Temp 98 F (36.7 C) (Temporal)   Ht 4\' 11"  (1.499 m)   Wt 120 lb 6 oz (54.6 kg)   SpO2 98%   BMI 24.31 kg/m    CC: HA  Subjective:   HPI: Tanya Nguyen is a 55 y.o. female presenting on 07/16/2020 for Headache (C/o ongoing HA.  Started about 1.5 wks.  Takes topamax and sumatriptan, helpful.  )   1.5 month h/o constant headache described as pounding pain throughout head, photo/phonophobia, nausea, activity limiting. Has been out of work for 1 week. Feels like typical migraines. At its worse 10/10, currently 7/10.  Known longstanding h/o migraines.  She only receives 9 imtrex/mo ($130). Has run out.  Manages migraine with imitrex, zofran as well as topamax 50mg  nightly (tolerates well). Tylenol ineffective.   No vision changes, fevers/chills, weight changes   Seen 3 times virtually in the past 2 wks with cough, as well as in person at [facility] - treated for migraine with toradol IM and imitrex 25mg  and zofran, treated for sinusitis with flonase and doxycycline 7d course. Subsequently received prednisone taper and latest levaquin 500mg  7d course. Also received cough suppressant. She was also referred to ENT last week after latest virtual evaluation (appt later this month). She feels antibiotics were helfpul for sinus congestion component.   She did receive treatment for presumed sinusitis by [facility] 05/2020 with amoxicillin course with benefit.  ?weather or recent sinus infections as trigger.   No h/o CAD or stroke.   She states she tested negative for COVID x2 in the last few weeks.  COVID vaccine: JJ 06/18/2019     Relevant past medical, surgical, family and social history reviewed and updated as indicated. Interim medical history since our last visit reviewed. Allergies and medications reviewed and updated. Outpatient Medications Prior to  Visit  Medication Sig Dispense Refill  . cetirizine (ZYRTEC) 10 MG tablet TAKE 1 TABLET BY MOUTH EVERY DAY AS NEEDED FOR ALLERGY 90 tablet 1  . DULoxetine (CYMBALTA) 30 MG capsule TAKE 1 CAPSULE (30 MG TOTAL) BY MOUTH DAILY. FOR PAIN AND ANXIETY. 90 capsule 0  . fluticasone (FLONASE) 50 MCG/ACT nasal spray Place 1 spray into both nostrils 2 (two) times daily. 16 g 0  . levothyroxine (SYNTHROID) 88 MCG tablet TAKE 1 TABLET BY MOUTH EVERY MORNING ON EMPTY STOMACH WITH WATER ONLY-NO FOOD OR OTHER MED X 30 MIN 90 tablet 3  . oxybutynin (DITROPAN-XL) 5 MG 24 hr tablet TAKE 1 TABLET (5 MG TOTAL) BY MOUTH AT BEDTIME. FOR BLADDER INCONTINENCE. 90 tablet 0  . pantoprazole (PROTONIX) 40 MG tablet TAKE 1 TABLET BY MOUTH EVERY DAY --CALL OFFICE TO SCHEDULE A FOLLOW UP APPOINTMENT 30 tablet 0  . promethazine-dextromethorphan (PROMETHAZINE-DM) 6.25-15 MG/5ML syrup Take 5 mLs by mouth at bedtime as needed for cough. 118 mL 0  . traMADol (ULTRAM) 50 MG tablet TAKE 1 TABLET BY MOUTH EVERY 6 HOURS AS NEEDED FOR UP TO 5 DAYS. 30 tablet 0  . traZODone (DESYREL) 50 MG tablet Take 3 tablets (150 mg total) by mouth at bedtime. 270 tablet 1  . cyclobenzaprine (FLEXERIL) 5 MG tablet TAKE 1/2 TO 1 TABLET (2.5-5 MG TOTAL) BY MOUTH DAILY AS NEEDED (FOOT PAIN). 30 tablet 0  . ondansetron (ZOFRAN-ODT) 4 MG disintegrating tablet Take 1 tablet by  mouth every 8 (eight) hours as needed.    . SUMAtriptan (IMITREX) 25 MG tablet TAKE 1 TABLET BY MOUTH (MAY REPEAT IN 2 HOURS IF HEADACHE PERSISTS OR RECURS.) 9 tablet 0  . topiramate (TOPAMAX) 50 MG tablet TAKE 1 TABLET (50 MG TOTAL) BY MOUTH AT BEDTIME. FOR MIGRAINE PREVENTION. 90 tablet 1  . levofloxacin (LEVAQUIN) 500 MG tablet Take 1 tablet (500 mg total) by mouth daily for 7 days. 7 tablet 0  . predniSONE (DELTASONE) 20 MG tablet 3 tabs by mouth daily x 3 days, then 2 tabs by mouth daily x 2 days then 1 tab by mouth daily x 2 days 15 tablet 0   No facility-administered medications  prior to visit.     Per HPI unless specifically indicated in ROS section below Review of Systems Objective:  BP 120/78   Pulse 95   Temp 98 F (36.7 C) (Temporal)   Ht 4\' 11"  (1.499 m)   Wt 120 lb 6 oz (54.6 kg)   SpO2 98%   BMI 24.31 kg/m   Wt Readings from Last 3 Encounters:  07/16/20 120 lb 6 oz (54.6 kg)  07/01/20 115 lb (52.2 kg)  05/22/20 121 lb (54.9 kg)      Physical Exam Vitals and nursing note reviewed.  Constitutional:      General: She is not in acute distress.    Appearance: Normal appearance. She is well-developed. She is not ill-appearing.  HENT:     Head: Normocephalic and atraumatic.     Right Ear: Hearing, tympanic membrane, ear canal and external ear normal.     Left Ear: Hearing, tympanic membrane, ear canal and external ear normal.     Nose: Nose normal. No mucosal edema, congestion or rhinorrhea.     Right Sinus: No maxillary sinus tenderness or frontal sinus tenderness.     Left Sinus: No maxillary sinus tenderness or frontal sinus tenderness.     Mouth/Throat:     Mouth: Mucous membranes are moist.     Pharynx: Oropharynx is clear. Uvula midline. No oropharyngeal exudate or posterior oropharyngeal erythema.     Tonsils: No tonsillar abscesses.  Eyes:     General: No scleral icterus.    Extraocular Movements: Extraocular movements intact.     Conjunctiva/sclera: Conjunctivae normal.     Pupils: Pupils are equal, round, and reactive to light.  Cardiovascular:     Rate and Rhythm: Normal rate and regular rhythm.     Pulses: Normal pulses.     Heart sounds: Normal heart sounds. No murmur heard.   Pulmonary:     Effort: Pulmonary effort is normal. No respiratory distress.     Breath sounds: Normal breath sounds. No wheezing, rhonchi or rales.  Musculoskeletal:     Cervical back: Normal range of motion and neck supple.  Lymphadenopathy:     Cervical: No cervical adenopathy.  Skin:    General: Skin is warm and dry.     Findings: No rash.   Neurological:     Mental Status: She is alert.     Cranial Nerves: Cranial nerves are intact.     Sensory: Sensation is intact.     Motor: Motor function is intact.     Coordination: Coordination is intact.     Gait: Gait is intact.     Comments:  CN 2-12 intact FTN intact EOMI  Psychiatric:        Mood and Affect: Mood normal.        Behavior:  Behavior normal.       Results for orders placed or performed during the hospital encounter of 04/02/20  I-STAT creatinine  Result Value Ref Range   Creatinine, Ser 0.90 0.44 - 1.00 mg/dL   Assessment & Plan:  This visit occurred during the SARS-CoV-2 public health emergency.  Safety protocols were in place, including screening questions prior to the visit, additional usage of staff PPE, and extensive cleaning of exam room while observing appropriate contact time as indicated for disinfecting solutions.   Problem List Items Addressed This Visit    Migraine - Primary    Story/exam most consistent with status migrainosus - treat with toradol IM 30mg  today as well as dexamethasone 10mg  IM x1. Avoid phenergan as she drove herself.  For poorly controlled migraines - will increase topamax to 100mg  daily as well as increase imitrex to 50mg  PRN. Reviewed correct administration of medication (take at onset of migraine aura). Discussed flexeril abortively as well.  Update [place] with above effect, I asked her to f/u with PCP in 3-4 wks for migraine f/u visit, let us know sooner if not improving with treatment.  Check labs r/o other causes of worsening migraine.      Relevant Medications   SUMAtriptan (IMITREX) 50 MG tablet   topiramate (TOPAMAX) 100 MG tablet   cyclobenzaprine (FLEXERIL) 5 MG tablet   Other Relevant Orders   Comprehensive metabolic panel   CBC with Differential/Platelet   Hypothyroidism   Relevant Orders   TSH   Idiopathic peripheral neuropathy   Relevant Medications   topiramate (TOPAMAX) 100 MG tablet   cyclobenzaprine  (FLEXERIL) 5 MG tablet       Meds ordered this encounter  Medications  . SUMAtriptan (IMITREX) 50 MG tablet    Sig: TAKE 1 TABLET BY MOUTH (MAY REPEAT IN 2 HOURS IF HEADACHE PERSISTS OR RECURS.)    Dispense:  9 tablet    Refill:  3  . topiramate (TOPAMAX) 100 MG tablet    Sig: Take 1 tablet (100 mg total) by mouth at bedtime. For migraine prevention.    Dispense:  90 tablet    Refill:  1    Note new dose  . ondansetron (ZOFRAN-ODT) 4 MG disintegrating tablet    Sig: Take 1 tablet (4 mg total) by mouth every 8 (eight) hours as needed for nausea.    Dispense:  20 tablet    Refill:  0  . cyclobenzaprine (FLEXERIL) 5 MG tablet    Sig: Take 1 tablet (5 mg total) by mouth 2 (two) times daily as needed for muscle spasms (migraine (sedation precautions)).    Dispense:  30 tablet    Refill:  0  . ketorolac (TORADOL) 30 MG/ML injection 30 mg  . dexamethasone (DECADRON) injection 10 mg   Orders Placed This Encounter  Procedures  . Comprehensive metabolic panel  . TSH  . CBC with Differential/Platelet    Patient Instructions  I think you have ongoing migraine.  Treat with toradol shot today to give some relief.  Increase topamax to 100mg  daily, increase imitrex to 50mg  as needed for migraine, may also use flexeril 5mg  muscle relaxant as needed for migraine. Let us know how this helps, schedule follow up visit with Anda Kraft in 3-4 weeks, sooner if worsening.    Follow up plan: Return in about 4 weeks (around 08/13/2020) for follow up visit.  Ria Bush, MD

## 2020-07-17 ENCOUNTER — Telehealth: Payer: Self-pay

## 2020-07-17 NOTE — Telephone Encounter (Signed)
Left message on vm per dpr relaying results and Dr. Synthia Innocent message.  Labs, 07/16/20: Plz notify kidneys, liver, sugar, thyroid and blood counts returned reassuringly normal. No cause for worsening migraines found on blood work

## 2020-07-21 NOTE — Telephone Encounter (Signed)
Pt called back and did not get v/m; pt notified as instructed and pt voiced understanding.pt said since Dr Darnell Level increased her med she is feeling better. Pt will cb if needed.

## 2020-07-29 ENCOUNTER — Telehealth: Payer: Self-pay

## 2020-07-29 NOTE — Telephone Encounter (Signed)
Patient called requesting another refill for her tramadol   Please advise

## 2020-08-01 ENCOUNTER — Ambulatory Visit: Payer: 59 | Admitting: Podiatry

## 2020-08-19 ENCOUNTER — Telehealth: Payer: Self-pay

## 2020-08-19 ENCOUNTER — Ambulatory Visit: Payer: 59 | Admitting: Primary Care

## 2020-08-19 NOTE — Telephone Encounter (Signed)
Contacted pt and advised she is to have apt in the office. Pt verbalized understanding.

## 2020-08-19 NOTE — Telephone Encounter (Signed)
[place] Night - Client Nonclinical Telephone Record  [facility] Client [place] Night - Client Client Site [place] Physician Alma Friendly - NP Contact Type Call Who Is Calling Patient / Member / Family / Caregiver [name] Name Aianna Fahs [name] Phone Number [phone] Patient Name Tanya Nguyen Patient DOB 02-15-1966 Call Type Message Only Information Provided Reason for Call Request for General Office Information Initial Comment [name] states she has an appt. on Thursday and she is wanting to know if she needs to go in the office or wait in her car . Additional Comment Office hours provided. Disp. Time Disposition Final User 08/19/2020 7:41:08 AM General Information Provided Yes Mikki Santee Call Closed By: Mikki Santee Transaction Date/Time: 08/19/2020 7:38:21 AM (ET)

## 2020-08-20 NOTE — Telephone Encounter (Signed)
Patient has been notified of Dr. Amalia Hailey recommendations.

## 2020-08-21 ENCOUNTER — Other Ambulatory Visit: Payer: Self-pay

## 2020-08-21 ENCOUNTER — Encounter: Payer: Self-pay | Admitting: Primary Care

## 2020-08-21 ENCOUNTER — Ambulatory Visit: Payer: 59 | Admitting: Primary Care

## 2020-08-21 DIAGNOSIS — G43709 Chronic migraine without aura, not intractable, without status migrainosus: Secondary | ICD-10-CM

## 2020-08-21 DIAGNOSIS — M79672 Pain in left foot: Secondary | ICD-10-CM

## 2020-08-21 DIAGNOSIS — M722 Plantar fascial fibromatosis: Secondary | ICD-10-CM

## 2020-08-21 DIAGNOSIS — M79671 Pain in right foot: Secondary | ICD-10-CM

## 2020-08-21 DIAGNOSIS — G609 Hereditary and idiopathic neuropathy, unspecified: Secondary | ICD-10-CM | POA: Diagnosis not present

## 2020-08-21 MED ORDER — DULOXETINE HCL 60 MG PO CPEP: 60.0000 mg | ORAL_CAPSULE | Freq: Every day | ORAL | 1 refills | Status: DC

## 2020-08-21 MED ORDER — TRAMADOL HCL 50 MG PO TABS: ORAL_TABLET | ORAL | 0 refills | Status: DC

## 2020-08-21 NOTE — Assessment & Plan Note (Signed)
Chronic, no obvious cause for symptoms. Follows with podiatry.  Agree to increase duloxetine (Cymbalta) to 60 mg.  She will update.   Agree to refill Tramadol 50 mg, she takes 1/2 tablet 2-3 days weekly. Last refill was in early March 2022.

## 2020-08-21 NOTE — Assessment & Plan Note (Addendum)
Improved and less frequent since dose increase of Topamax to 100 mg. Continue same.  Continue PRN cyclobenzaprine 5 mg and sumatriptan PRN for migraine abortion. No recent use of Imitrex.   Continue to monitor.

## 2020-08-21 NOTE — Patient Instructions (Addendum)
We increased the dose of your duloxetine (Cymbalta) to 60 mg. I sent a new prescription to your pharmacy.  Use the Tramadol only if needed.  Schedule a physical up front.   It was a pleasure to see you today!

## 2020-08-21 NOTE — Progress Notes (Signed)
Subjective:    Patient ID: Tanya Nguyen, female    DOB: [birth year], 55 y.o.   MRN: [id number]  HPI  Tanya Nguyen is a very pleasant 55 y.o. female with a history of migraines, hypothyroidism, chronic foot pain, PTSD, Bipolar Disorder who presents today for follow up.  1) Migraines: She was last evaluated by Dr. Danise Mina on 07/16/20 for headaches. She endorsed a 1.5 month history of constant headache with photo/phonophobia, nausea, had to be out of work. Current regimen of Topamax ineffective. She was treated with IM Toradol 30 mg and Dexamethasone 10 mg. Her Topamax was increased to 100 mg and Imitrex was increased to 50 mg. She was told to follow up today.   Overall her migraines have reduced. She's doing better.   2) Chronic Foot Pain: Bilateral Plantar Fasciitis and idiopathic neuropathy. Following with podiatry. Previously managed on Lyrica for which she could not tolerate due to restless legs and leg trembling. She experienced hair loss with gabapentin. She is managed on Tramadol however podiatry is no longer prescribing. During her visit she was injected with cortisone to her heels, was referred to pain management.   Today she is frustrated with her continued pain as there has been no identifiable cause for symptoms. She is managed on Cymbalta 30 mg for anxiety and pain.   She is working on her feet three days weekly, on concrete floors. Feet feel like there are on fire and red. She would like to increase her dose of Cymbalta to 60 mg given her ongoing pain.   BP Readings from Last 3 Encounters:  08/21/20 120/74  07/16/20 120/78  05/22/20 110/62     Review of Systems  Musculoskeletal:  Positive for arthralgias.       Chronic pain bilaterally   Neurological:  Negative for headaches.        Past Medical History:  Diagnosis Date   Acquired pyloric stricture    Bleeding duodenal ulcer    Chronic esophagogastric ulcer    Depression    Duodenal obstruction    Gastric outlet  obstruction 01/19/2018   GERD (gastroesophageal reflux disease)    History of stomach ulcers    Hypothyroidism    Incisional hernia, without obstruction or gangrene    Intractable vomiting    Kidney stones    Migraine    Migraine headache    only1-2x/month since starting topamax   Neuropathy    Side pain 03/30/2018    Social History   Socioeconomic History   Marital status: Married    Spouse name: Not on file   Number of children: 0   Years of education: 12   Highest education level: High school graduate  Occupational History   Occupation: Homemaker  Tobacco Use   Smoking status: Never   Smokeless tobacco: Never  Vaping Use   Vaping Use: Never used  Substance and Sexual Activity   Alcohol use: Not Currently    Alcohol/week: 0.0 standard drinks   Drug use: No   Sexual activity: Yes    Birth control/protection: None  Other Topics Concern   Not on file  Social History Narrative   Lives at home with her husband.   Right-handed.   Rare caffeine.   Social Determinants of Health   Financial Resource Strain: Not on file  Food Insecurity: Not on file  Transportation Needs: Not on file  Physical Activity: Not on file  Stress: Not on file  Social Connections: Not on file  Intimate Partner Violence: Not  on file    Past Surgical History:  Procedure Laterality Date   ABDOMINAL HYSTERECTOMY  2004   partial   CHOLECYSTECTOMY     COLONOSCOPY WITH PROPOFOL N/A 02/27/2018   Procedure: COLONOSCOPY WITH PROPOFOL;  Surgeon: [name], MD;  Location: [place];  Service: Endoscopy;  Laterality: N/A;   ESOPHAGOGASTRODUODENOSCOPY (EGD) WITH PROPOFOL N/A 01/12/2018   Procedure: ESOPHAGOGASTRODUODENOSCOPY (EGD) WITH BIOPSIES;  Surgeon: [name], MD;  Location: [place];  Service: Endoscopy;  Laterality: N/A;   ESOPHAGOGASTRODUODENOSCOPY (EGD) WITH PROPOFOL N/A 01/25/2018   Procedure: ESOPHAGOGASTRODUODENOSCOPY (EGD) WITH PROPOFOL;  Surgeon: Jonathon Bellows,  MD;  Location: [facility] ENDOSCOPY;  Service: Gastroenterology;  Laterality: N/A;   ESOPHAGOGASTRODUODENOSCOPY (EGD) WITH PROPOFOL N/A 02/27/2018   Procedure: ESOPHAGOGASTRODUODENOSCOPY (EGD) WITH PROPOFOL;  Surgeon: [name], MD;  Location: [place];  Service: Endoscopy;  Laterality: N/A;   INCISIONAL HERNIA REPAIR N/A 11/23/2018   Procedure: HERNIA REPAIR INCISIONAL;  Surgeon: Olean Ree, MD;  Location: [facility];  Service: General;  Laterality: N/A;   INSERTION OF MESH N/A 11/23/2018   Procedure: INSERTION OF MESH;  Surgeon: Olean Ree, MD;  Location: [facility];  Service: General;  Laterality: N/A;   LAPAROTOMY N/A 01/20/2018   Procedure: EXPLORATORY LAPAROTOMY;  Surgeon: Olean Ree, MD;  Location: [facility];  Service: General;  Laterality: N/A;   TOOTH EXTRACTION      Family History  Problem Relation Age of Onset   Cancer Mother    Breast cancer Mother 69   Lung cancer Father    Alcohol abuse Sister    Bipolar disorder Sister    Alcohol abuse Maternal Grandmother    Stroke Maternal Grandmother    Stroke Paternal Grandmother     Allergies  Allergen Reactions   Penicillins Shortness Of Breath, Diarrhea and Nausea And Vomiting    Has patient had a PCN reaction causing immediate rash, facial/tongue/throat swelling, SOB or lightheadedness with hypotension: yes Has patient had a PCN reaction causing severe rash involving mucus membranes or skin necrosis: no Has patient had a PCN reaction that required hospitalization no Has patient had a PCN reaction occurring within the last 10 years: about 10 years If all of the above answers are "NO", then may proceed with Cephalosporin use.    Gabapentin     Hair loss   Pregabalin     Tremors, dropped items, tingling   Sulfa Antibiotics Nausea And Vomiting    Other reaction(s): VOMITING    Current Outpatient Medications on File Prior to Visit  Medication Sig Dispense Refill   cetirizine (ZYRTEC) 10 MG tablet TAKE 1 TABLET BY  MOUTH EVERY DAY AS NEEDED FOR ALLERGY 90 tablet 1   cyclobenzaprine (FLEXERIL) 5 MG tablet Take 1 tablet (5 mg total) by mouth 2 (two) times daily as needed for muscle spasms (migraine (sedation precautions)). 30 tablet 0   DULoxetine (CYMBALTA) 30 MG capsule TAKE 1 CAPSULE (30 MG TOTAL) BY MOUTH DAILY. FOR PAIN AND ANXIETY. 90 capsule 0   fluticasone (FLONASE) 50 MCG/ACT nasal spray Place 1 spray into both nostrils 2 (two) times daily. 16 g 0   levothyroxine (SYNTHROID) 88 MCG tablet TAKE 1 TABLET BY MOUTH EVERY MORNING ON EMPTY STOMACH WITH WATER ONLY-NO FOOD OR OTHER MED X 30 MIN 90 tablet 3   ondansetron (ZOFRAN-ODT) 4 MG disintegrating tablet Take 1 tablet (4 mg total) by mouth every 8 (eight) hours as needed for nausea. 20 tablet 0   oxybutynin (DITROPAN-XL) 5 MG 24 hr tablet TAKE  1 TABLET (5 MG TOTAL) BY MOUTH AT BEDTIME. FOR BLADDER INCONTINENCE. 90 tablet 0   pantoprazole (PROTONIX) 40 MG tablet TAKE 1 TABLET BY MOUTH EVERY DAY --CALL OFFICE TO SCHEDULE A FOLLOW UP APPOINTMENT 30 tablet 0   SUMAtriptan (IMITREX) 50 MG tablet TAKE 1 TABLET BY MOUTH (MAY REPEAT IN 2 HOURS IF HEADACHE PERSISTS OR RECURS.) 9 tablet 3   topiramate (TOPAMAX) 100 MG tablet Take 1 tablet (100 mg total) by mouth at bedtime. For migraine prevention. 90 tablet 1   traMADol (ULTRAM) 50 MG tablet TAKE 1 TABLET BY MOUTH EVERY 6 HOURS AS NEEDED FOR UP TO 5 DAYS. 30 tablet 0   traZODone (DESYREL) 50 MG tablet Take 3 tablets (150 mg total) by mouth at bedtime. 270 tablet 1   No current facility-administered medications on file prior to visit.    BP 120/74   Pulse 100   Temp 98.6 F (37 C) (Temporal)   Ht 4\' 11"  (1.499 m)   Wt 114 lb (51.7 kg)   SpO2 97%   BMI 23.03 kg/m  Objective:   Physical Exam Cardiovascular:     Rate and Rhythm: Normal rate and regular rhythm.  Pulmonary:     Effort: Pulmonary effort is normal.     Breath sounds: Normal breath sounds.  Musculoskeletal:     Cervical back: Neck supple.   Skin:    General: Skin is warm and dry.  Psychiatric:        Mood and Affect: Mood normal.          Assessment & Plan:      This visit occurred during the SARS-CoV-2 public health emergency.  Safety protocols were in place, including screening questions prior to the visit, additional usage of staff PPE, and extensive cleaning of exam room while observing appropriate contact time as indicated for disinfecting solutions.

## 2020-09-13 ENCOUNTER — Other Ambulatory Visit: Payer: Self-pay | Admitting: Primary Care

## 2020-09-13 DIAGNOSIS — R32 Unspecified urinary incontinence: Secondary | ICD-10-CM

## 2020-10-28 ENCOUNTER — Other Ambulatory Visit: Payer: Self-pay

## 2020-10-28 ENCOUNTER — Ambulatory Visit (INDEPENDENT_AMBULATORY_CARE_PROVIDER_SITE_OTHER): Payer: 59 | Admitting: Podiatry

## 2020-10-28 DIAGNOSIS — G609 Hereditary and idiopathic neuropathy, unspecified: Secondary | ICD-10-CM

## 2020-10-28 DIAGNOSIS — M722 Plantar fascial fibromatosis: Secondary | ICD-10-CM

## 2020-10-28 MED ORDER — BETAMETHASONE SOD PHOS & ACET 6 (3-3) MG/ML IJ SUSP
3.0000 mg | Freq: Once | INTRAMUSCULAR | Status: AC
  Administered 2020-10-28: 3 mg via INTRA_ARTICULAR

## 2020-10-28 NOTE — Progress Notes (Signed)
Neuro  Subjective: 55 year old female presenting today for follow up evaluation of bilateral plantar fasciitis and idiopathic peripheral neuropathy.  Patient states that she has been taking the tramadol and Cymbalta from her PCP.  She is doing well but she has had an increase of pain and tenderness to the plantar heels for the past few months.  She says the injections helped significantly.   Past Medical History:  Diagnosis Date   Acquired pyloric stricture    Bleeding duodenal ulcer    Chronic esophagogastric ulcer    Depression    Duodenal obstruction    Gastric outlet obstruction 01/19/2018   GERD (gastroesophageal reflux disease)    History of stomach ulcers    Hypothyroidism    Incisional hernia, without obstruction or gangrene    Intractable vomiting    Kidney stones    Migraine    Migraine headache    only1-2x/month since starting topamax   Neuropathy    Side pain 03/30/2018     Objective: Physical Exam General: The patient is alert and oriented x3 in no acute distress.  Dermatology: Skin is warm, dry and supple bilateral lower extremities. Negative for open lesions or macerations bilateral.   Vascular: Dorsalis Pedis and Posterior Tibial pulses palpable bilateral.  Capillary fill time is immediate to all digits.  Neurological: Epicritic and protective threshold intact bilateral.   Musculoskeletal: Tenderness to palpation to the plantar aspect of the bilateral heels along the plantar fascia. All other joints range of motion within normal limits bilateral. Strength 5/5 in all groups bilateral.   Assessment: 1. plantar fasciitis bilateral feet  2. Idiopathic peripheral neuropathy bilateral   Plan of Care:  1. Patient evaluated.  2. Injection of 0.5cc Celestone soluspan injected into the bilateral heels.  3.  Continue Cymbalta as per PCP 4.  Continue tramadol 50 mg as per PCP 5.  Continue wearing good supportive shoes and arch supports 6.  Return to clinic as  needed  Working 2 days/week at [employer].  Edrick Kins, DPM [facility]  Dr. Edrick Kins, DPM    [address], [place] [address]                Office [phone]  Fax [phone]

## 2020-11-05 ENCOUNTER — Ambulatory Visit (INDEPENDENT_AMBULATORY_CARE_PROVIDER_SITE_OTHER): Payer: 59 | Admitting: Primary Care

## 2020-11-05 ENCOUNTER — Encounter: Payer: Self-pay | Admitting: Primary Care

## 2020-11-05 ENCOUNTER — Other Ambulatory Visit: Payer: Self-pay

## 2020-11-05 VITALS — BP 118/42 | HR 82 | Temp 98.1°F | Ht 59.0 in | Wt 113.0 lb

## 2020-11-05 DIAGNOSIS — F3130 Bipolar disorder, current episode depressed, mild or moderate severity, unspecified: Secondary | ICD-10-CM

## 2020-11-05 DIAGNOSIS — Z0000 Encounter for general adult medical examination without abnormal findings: Secondary | ICD-10-CM | POA: Diagnosis not present

## 2020-11-05 DIAGNOSIS — E038 Other specified hypothyroidism: Secondary | ICD-10-CM

## 2020-11-05 DIAGNOSIS — G609 Hereditary and idiopathic neuropathy, unspecified: Secondary | ICD-10-CM

## 2020-11-05 DIAGNOSIS — K219 Gastro-esophageal reflux disease without esophagitis: Secondary | ICD-10-CM

## 2020-11-05 DIAGNOSIS — E538 Deficiency of other specified B group vitamins: Secondary | ICD-10-CM

## 2020-11-05 DIAGNOSIS — Z23 Encounter for immunization: Secondary | ICD-10-CM

## 2020-11-05 DIAGNOSIS — K5900 Constipation, unspecified: Secondary | ICD-10-CM

## 2020-11-05 DIAGNOSIS — R7303 Prediabetes: Secondary | ICD-10-CM

## 2020-11-05 DIAGNOSIS — F4310 Post-traumatic stress disorder, unspecified: Secondary | ICD-10-CM

## 2020-11-05 DIAGNOSIS — M79671 Pain in right foot: Secondary | ICD-10-CM | POA: Diagnosis not present

## 2020-11-05 DIAGNOSIS — M722 Plantar fascial fibromatosis: Secondary | ICD-10-CM | POA: Diagnosis not present

## 2020-11-05 DIAGNOSIS — Z1231 Encounter for screening mammogram for malignant neoplasm of breast: Secondary | ICD-10-CM

## 2020-11-05 DIAGNOSIS — E559 Vitamin D deficiency, unspecified: Secondary | ICD-10-CM

## 2020-11-05 DIAGNOSIS — G43709 Chronic migraine without aura, not intractable, without status migrainosus: Secondary | ICD-10-CM

## 2020-11-05 DIAGNOSIS — N3941 Urge incontinence: Secondary | ICD-10-CM

## 2020-11-05 DIAGNOSIS — M79672 Pain in left foot: Secondary | ICD-10-CM

## 2020-11-05 LAB — LIPID PANEL
Cholesterol: 183 mg/dL (ref 0–200)
HDL: 59.2 mg/dL (ref >39.00)
LDL Cholesterol: 98 mg/dL (ref 0–99)
NonHDL: 124.23
Total CHOL/HDL Ratio: 3
Triglycerides: 130 mg/dL (ref 0.0–149.0)
VLDL: 26 mg/dL (ref 0.0–40.0)

## 2020-11-05 LAB — HEMOGLOBIN A1C: Hgb A1c MFr Bld: 5.9 % (ref 4.6–6.5)

## 2020-11-05 LAB — VITAMIN B12: Vitamin B-12: 762 pg/mL (ref 211–911)

## 2020-11-05 LAB — VITAMIN D 25 HYDROXY (VIT D DEFICIENCY, FRACTURES): VITD: 31.15 ng/mL (ref 30.00–100.00)

## 2020-11-05 MED ORDER — TRAMADOL HCL 50 MG PO TABS: ORAL_TABLET | ORAL | 0 refills | Status: DC

## 2020-11-05 NOTE — Assessment & Plan Note (Signed)
Doing well on daily Cymbalta 60 mg and PRN Tramadol 25 mg.   Agree to refill Tramadol 25 mg, she takes this twice weekly on average.

## 2020-11-05 NOTE — Assessment & Plan Note (Signed)
Compliant to Topamax 100 mg nightly, doing much better. Infrequent migraines.   Continue Topamax 100 mg nightly and PRN cyclobenzaprine and sumatriptan.

## 2020-11-05 NOTE — Assessment & Plan Note (Signed)
Compliant to vitamin B12 1000 mcg daily. Continue same.

## 2020-11-05 NOTE — Assessment & Plan Note (Signed)
Following with podiatry, recently received cortisone injection to feet.   Continue Cymbalta 60 mg daily and Tramadol 50 mg as needed.

## 2020-11-05 NOTE — Assessment & Plan Note (Signed)
Denies concerns today. Continue to monitor,

## 2020-11-05 NOTE — Assessment & Plan Note (Signed)
Stable Continue Cymbalta 60mg daily

## 2020-11-05 NOTE — Patient Instructions (Addendum)
Stop by the lab prior to leaving today. I will notify you of your results once received.   Use the Tramadol sparingly If needed for pain.  [facility] to schedule your mammogram.   Call your GI doctor for a follow up appointment.  It was a pleasure to see you today!  Preventive Care 56-56 Years Old, Female Preventive care refers to lifestyle choices and visits with your health care provider that can promote health and wellness. This includes: A yearly physical exam. This is also called an annual wellness visit. Regular dental and eye exams. Immunizations. Screening for certain conditions. Healthy lifestyle choices, such as: Eating a healthy diet. Getting regular exercise. Not using drugs or products that contain nicotine and tobacco. Limiting alcohol use. What can I expect for my preventive care visit? Physical exam Your health care provider will check your: Height and weight. These may be used to calculate your BMI (body mass index). BMI is a measurement that tells if you are at a healthy weight. Heart rate and blood pressure. Body temperature. Skin for abnormal spots. Counseling Your health care provider may ask you questions about your: Past medical problems. Family's medical history. Alcohol, tobacco, and drug use. Emotional well-being. Home life and relationship well-being. Sexual activity. Diet, exercise, and sleep habits. Work and work Statistician. Access to firearms. Method of birth control. Menstrual cycle. Pregnancy history. What immunizations do I need? Vaccines are usually given at various ages, according to a schedule. Your health care provider will recommend vaccines for you based on your age, medical history, and lifestyle or other factors, such as travel or where you work. What tests do I need? Blood tests Lipid and cholesterol levels. These may be checked every 5 years, or more often if you are over 56 years old. Hepatitis C test. Hepatitis  B test. Screening Lung cancer screening. You may have this screening every year starting at age 56 if you have a 30-pack-year history of smoking and currently smoke or have quit within the past 15 years. Colorectal cancer screening. All adults should have this screening starting at age 56 and continuing until age 13. Your health care provider may recommend screening at age 56 if you are at increased risk. You will have tests every 1-10 years, depending on your results and the type of screening test. Diabetes screening. This is done by checking your blood sugar (glucose) after you have not eaten for a while (fasting). You may have this done every 1-3 years. Mammogram. This may be done every 1-2 years. Talk with your health care provider about when you should start having regular mammograms. This may depend on whether you have a family history of breast cancer. BRCA-related cancer screening. This may be done if you have a family history of breast, ovarian, tubal, or peritoneal cancers. Pelvic exam and Pap test. This may be done every 3 years starting at age 56. Starting at age 2, this may be done every 5 years if you have a Pap test in combination with an HPV test. Other tests STD (sexually transmitted disease) testing, if you are at risk. Bone density scan. This is done to screen for osteoporosis. You may have this scan if you are at high risk for osteoporosis. Talk with your health care provider about your test results, treatment options, and if necessary, the need for more tests. Follow these instructions at home: Eating and drinking  Eat a diet that includes fresh fruits and vegetables, whole grains, lean protein,  and low-fat dairy products. Take vitamin and mineral supplements as recommended by your health care provider. Do not drink alcohol if: Your health care provider tells you not to drink. You are pregnant, may be pregnant, or are planning to become pregnant. If you drink  alcohol: Limit how much you have to 0-1 drink a day. Be aware of how much alcohol is in your drink. In the [place] one drink equals one 12 oz bottle of beer (355 mL), one 5 oz glass of wine (148 mL), or one 1 oz glass of hard liquor (44 mL). Lifestyle Take daily care of your teeth and gums. Brush your teeth every morning and night with fluoride toothpaste. Floss one time each day. Stay active. Exercise for at least 30 minutes 5 or more days each week. Do not use any products that contain nicotine or tobacco, such as cigarettes, e-cigarettes, and chewing tobacco. If you need help quitting, ask your health care provider. Do not use drugs. If you are sexually active, practice safe sex. Use a condom or other form of protection to prevent STIs (sexually transmitted infections). If you do not wish to become pregnant, use a form of birth control. If you plan to become pregnant, see your health care provider for a prepregnancy visit. If told by your health care provider, take low-dose aspirin daily starting at age 56. Find healthy ways to cope with stress, such as: Meditation, yoga, or listening to music. Journaling. Talking to a trusted person. Spending time with friends and family. Safety Always wear your seat belt while driving or riding in a vehicle. Do not drive: If you have been drinking alcohol. Do not ride with someone who has been drinking. When you are tired or distracted. While texting. Wear a helmet and other protective equipment during sports activities. If you have firearms in your house, make sure you follow all gun safety procedures. What's next? Visit your health care provider once a year for an annual wellness visit. Ask your health care provider how often you should have your eyes and teeth checked. Stay up to date on all vaccines. This information is not intended to replace advice given to you by your health care provider. Make sure you discuss any questions you have with your  health care provider. Document Revised: 05/02/2020 Document Reviewed: 11/03/2017 Elsevier Patient Education  [place].       Influenza (Flu) Vaccine (Inactivated or Recombinant): What You Need to Know 1. Why get vaccinated? Influenza vaccine can prevent influenza (flu). Flu is a contagious disease that spreads around the [place] every year, usually between October and May. Anyone can get the flu, but it is more dangerous for some people. Infants and young children, people 79 years and older, pregnant people, and people with certain health conditions or a weakened immune system are at greatest risk of flu complications. Pneumonia, bronchitis, sinus infections, and ear infections are examples of flu-related complications. If you have a medical condition, such as heart disease, cancer, or diabetes, flu can make it worse. Flu can cause fever and chills, sore throat, muscle aches, fatigue, cough, headache, and runny or stuffy nose. Some people may have vomiting and diarrhea, though this is more common in children than adults. In an average year, thousands of people in the [place] die from flu, and many more are hospitalized. Flu vaccine prevents millions of illnesses and flu-related visits to the doctor each year. 2. Influenza vaccines CDC recommends everyone 6 months and older get  vaccinated every flu season. Children 6 months through 32 years of age may need 2 doses during a single flu season. Everyone else needs only 1 dose each flu season. It takes about 2 weeks for protection to develop after vaccination. There are many flu viruses, and they are always changing. Each year a new flu vaccine is made to protect against the influenza viruses believed to be likely to cause disease in the upcoming flu season. Even when the vaccine doesn't exactly match these viruses, it may still provide some protection. Influenza vaccine does not cause flu. Influenza vaccine may be given at the  same time as other vaccines. 3. Talk with your health care provider Tell your vaccination provider if the person getting the vaccine: Has had an allergic reaction after a previous dose of influenza vaccine, or has any severe, life-threatening allergies Has ever had Guillain-Barr Syndrome (also called "GBS") In some cases, your health care provider may decide to postpone influenza vaccination until a future visit. Influenza vaccine can be administered at any time during pregnancy. People who are or will be pregnant during influenza season should receive inactivated influenza vaccine. People with minor illnesses, such as a cold, may be vaccinated. People who are moderately or severely ill should usually wait until they recover before getting influenza vaccine. Your health care provider can give you more information. 4. Risks of a vaccine reaction Soreness, redness, and swelling where the shot is given, fever, muscle aches, and headache can happen after influenza vaccination. There may be a very small increased risk of Guillain-Barr Syndrome (GBS) after inactivated influenza vaccine (the flu shot). Young children who get the flu shot along with pneumococcal vaccine (PCV13) and/or DTaP vaccine at the same time might be slightly more likely to have a seizure caused by fever. Tell your health care provider if a child who is getting flu vaccine has ever had a seizure. People sometimes faint after medical procedures, including vaccination. Tell your provider if you feel dizzy or have vision changes or ringing in the ears. As with any medicine, there is a very remote chance of a vaccine causing a severe allergic reaction, other serious injury, or death. 5. What if there is a serious problem? An allergic reaction could occur after the vaccinated person leaves the clinic. If you see signs of a severe allergic reaction (hives, swelling of the face and throat, difficulty breathing, a fast heartbeat, dizziness,  or weakness), call 9-1-1 and get the person to the nearest hospital. For other signs that concern you, call your health care provider. Adverse reactions should be reported to the Vaccine Adverse Event Reporting System (VAERS). Your health care provider will usually file this report, or you can do it yourself. Visit the VAERS website at www.vaers.SamedayNews.es or call 539-095-0859. VAERS is only for reporting reactions, and VAERS staff members do not give medical advice. 6. The National Vaccine Injury Compensation Program The [employer] Vaccine Injury Compensation Program (VICP) is a federal program that was created to compensate people who may have been injured by certain vaccines. Claims regarding alleged injury or death due to vaccination have a time limit for filing, which may be as short as two years. Visit the VICP website at GoldCloset.com.ee or call 575-850-0199 to learn about the program and about filing a claim. 7. How can I learn more? Ask your health care provider. Call your local or state health department. Visit the website of the Food and Drug Administration (FDA) for vaccine package inserts and additional information  at TraderRating.uy. Contact the Centers for Disease Control and Prevention (CDC): Call 820-017-4117 (1-800-CDC-INFO) or Visit CDC's website at https://gibson.com/. Vaccine Information Statement Inactivated Influenza Vaccine (10/12/2019) This information is not intended to replace advice given to you by your health care provider. Make sure you discuss any questions you have with your health care provider. Document Revised: 11/29/2019 Document Reviewed: 11/29/2019 Elsevier Patient Education  2022 [employer].

## 2020-11-05 NOTE — Assessment & Plan Note (Signed)
Doing well on pantoprazole 40 mg, follows with GI. She will call to schedule follow up as she be due for repeat endoscopy.

## 2020-11-05 NOTE — Assessment & Plan Note (Signed)
Compliant to vitamin D 1000 IU daily. Continue same.

## 2020-11-05 NOTE — Assessment & Plan Note (Signed)
She is taking levothyroxine 88 mcg correctly. Repeat TSH pending. Continue levothyroxine 88 mcg.

## 2020-11-05 NOTE — Progress Notes (Signed)
Subjective:    Patient ID: Tanya Nguyen, female    DOB: 11/10/1965, 55 y.o.   MRN: [id number]  HPI  Tanya Nguyen is a very pleasant 55 y.o. female who presents today for complete physical and follow up of chronic conditions.  Immunizations: -Tetanus: 2016 -Influenza: Due this season -Covid-19: 1 J&J -Shingles: Completed   Diet: Fair diet.  Exercise: No regular exercise.  Eye exam: Completes annually  Dental exam: Completes semi-annually   Pap Smear: Hysterectomy Mammogram: Completed in September 2021 Colonoscopy: Completed in 2019, may be due again now.   BP Readings from Last 3 Encounters:  11/05/20 (!) 118/42  08/21/20 120/74  07/16/20 120/78      Review of Systems  Constitutional:  Negative for unexpected weight change.  HENT:  Negative for rhinorrhea.   Eyes:  Negative for visual disturbance.  Respiratory:  Negative for cough and shortness of breath.   Cardiovascular:  Negative for chest pain.  Gastrointestinal:  Negative for constipation and diarrhea.  Genitourinary:  Negative for difficulty urinating.  Musculoskeletal:  Negative for arthralgias and myalgias.       Chronic foot pain  Skin:  Negative for rash.  Allergic/Immunologic: Positive for environmental allergies.  Neurological:  Negative for dizziness and headaches.       Chronic foot pain to plantar feet  Psychiatric/Behavioral:  The patient is not nervous/anxious.         Past Medical History:  Diagnosis Date   Acquired pyloric stricture    Bleeding duodenal ulcer    Chronic esophagogastric ulcer    Depression    Duodenal obstruction    Gastric outlet obstruction 01/19/2018   GERD (gastroesophageal reflux disease)    History of stomach ulcers    Hypothyroidism    Incisional hernia, without obstruction or gangrene    Intractable vomiting    Kidney stones    Migraine    Migraine headache    only1-2x/month since starting topamax   Neuropathy    Side pain 03/30/2018    Social History    Socioeconomic History   Marital status: Married    Spouse name: Not on file   Number of children: 0   Years of education: 12   Highest education level: High school graduate  Occupational History   Occupation: Homemaker  Tobacco Use   Smoking status: Never   Smokeless tobacco: Never  Vaping Use   Vaping Use: Never used  Substance and Sexual Activity   Alcohol use: Not Currently    Alcohol/week: 0.0 standard drinks   Drug use: No   Sexual activity: Yes    Birth control/protection: None  Other Topics Concern   Not on file  Social History Narrative   Lives at home with her husband.   Right-handed.   Rare caffeine.   Social Determinants of Health   Financial Resource Strain: Not on file  Food Insecurity: Not on file  Transportation Needs: Not on file  Physical Activity: Not on file  Stress: Not on file  Social Connections: Not on file  Intimate Partner Violence: Not on file    Past Surgical History:  Procedure Laterality Date   ABDOMINAL HYSTERECTOMY  2004   partial   CHOLECYSTECTOMY     COLONOSCOPY WITH PROPOFOL N/A 02/27/2018   Procedure: COLONOSCOPY WITH PROPOFOL;  Surgeon: [name], MD;  Location: [facility];  Service: Endoscopy;  Laterality: N/A;   ESOPHAGOGASTRODUODENOSCOPY (EGD) WITH PROPOFOL N/A 01/12/2018   Procedure: ESOPHAGOGASTRODUODENOSCOPY (EGD) WITH BIOPSIES;  Surgeon: [name], MD;  Location: [facility];  Service: Endoscopy;  Laterality: N/A;   ESOPHAGOGASTRODUODENOSCOPY (EGD) WITH PROPOFOL N/A 01/25/2018   Procedure: ESOPHAGOGASTRODUODENOSCOPY (EGD) WITH PROPOFOL;  Surgeon: Jonathon Bellows, MD;  Location: [facility] END[facility]COPY;  Service: Gastroenterology;  Laterality: N/A;   ESOPHAGOGASTRODUODENOSCOPY (EGD) WITH PROPOFOL N/A 02/27/2018   Procedure: ESOPHAGOGASTRODUODENOSCOPY (EGD) WITH PROPOFOL;  Surgeon: [name], MD;  Location: [facility];  Service: Endoscopy;  Laterality: N/A;   INCISIONAL HERNIA REPAIR N/A 11/23/2018    Procedure: HERNIA REPAIR INCISIONAL;  Surgeon: Olean Ree, MD;  Location: [facility];  Service: General;  Laterality: N/A;   INSERTION OF MESH N/A 11/23/2018   Procedure: INSERTION OF MESH;  Surgeon: Olean Ree, MD;  Location: [facility];  Service: General;  Laterality: N/A;   LAPAROTOMY N/A 01/20/2018   Procedure: EXPLORATORY LAPAROTOMY;  Surgeon: Olean Ree, MD;  Location: [facility];  Service: General;  Laterality: N/A;   TOOTH EXTRACTION      Family History  Problem Relation Age of Onset   Cancer Mother    Breast cancer Mother 24   Lung cancer Father    Alcohol abuse Sister    Bipolar disorder Sister    Alcohol abuse Maternal Grandmother    Stroke Maternal Grandmother    Stroke Paternal Grandmother     Allergies  Allergen Reactions   Penicillins Shortness Of Breath, Diarrhea and Nausea And Vomiting    Has patient had a PCN reaction causing immediate rash, facial/tongue/throat swelling, SOB or lightheadedness with hypotension: yes Has patient had a PCN reaction causing severe rash involving mucus membranes or skin necrosis: no Has patient had a PCN reaction that required hospitalization no Has patient had a PCN reaction occurring within the last 10 years: about 10 years If all of the above answers are "NO", then may proceed with Cephalosporin use.    Gabapentin     Hair loss   Pregabalin     Tremors, dropped items, tingling   Sulfa Antibiotics Nausea And Vomiting    Other reaction(s): VOMITING    Current Outpatient Medications on File Prior to Visit  Medication Sig Dispense Refill   cetirizine (ZYRTEC) 10 MG tablet TAKE 1 TABLET BY MOUTH EVERY DAY AS NEEDED FOR ALLERGY 90 tablet 1   cyclobenzaprine (FLEXERIL) 5 MG tablet Take 1 tablet (5 mg total) by mouth 2 (two) times daily as needed for muscle spasms (migraine (sedation precautions)). 30 tablet 0   DULoxetine (CYMBALTA) 60 MG capsule Take 1 capsule (60 mg total) by mouth daily. For anxiety and pain. 90 capsule 1    fluticasone (FLONASE) 50 MCG/ACT nasal spray Place 1 spray into both nostrils 2 (two) times daily. 16 g 0   levothyroxine (SYNTHROID) 88 MCG tablet TAKE 1 TABLET BY MOUTH EVERY MORNING ON EMPTY STOMACH WITH WATER ONLY-NO FOOD OR OTHER MED X 30 MIN 90 tablet 3   ondansetron (ZOFRAN-ODT) 4 MG disintegrating tablet Take 1 tablet (4 mg total) by mouth every 8 (eight) hours as needed for nausea. 20 tablet 0   oxybutynin (DITROPAN-XL) 5 MG 24 hr tablet TAKE 1 TABLET (5 MG TOTAL) BY MOUTH AT BEDTIME. FOR BLADDER INCONTINENCE. 90 tablet 0   pantoprazole (PROTONIX) 40 MG tablet TAKE 1 TABLET BY MOUTH EVERY DAY --CALL OFFICE TO SCHEDULE A FOLLOW UP APPOINTMENT 30 tablet 0   SUMAtriptan (IMITREX) 50 MG tablet TAKE 1 TABLET BY MOUTH (MAY REPEAT IN 2 HOURS IF HEADACHE PERSISTS OR RECURS.) 9 tablet 3   topiramate (TOPAMAX) 100 MG tablet Take 1 tablet (100  mg total) by mouth at bedtime. For migraine prevention. 90 tablet 1   traMADol (ULTRAM) 50 MG tablet Take 1/2 tablet by mouth once daily as needed for severe foot pain. 30 tablet 0   traZODone (DESYREL) 50 MG tablet Take 3 tablets (150 mg total) by mouth at bedtime. 270 tablet 1   No current facility-administered medications on file prior to visit.    BP (!) 118/42   Pulse 82   Temp 98.1 F (36.7 C) (Temporal)   Ht '4\' 11"'$  (1.499 m)   Wt 113 lb (51.3 kg)   SpO2 97%   BMI 22.82 kg/m  Objective:   Physical Exam HENT:     Right Ear: Tympanic membrane and ear canal normal.     Left Ear: Tympanic membrane and ear canal normal.     Nose: Nose normal.  Eyes:     Conjunctiva/sclera: Conjunctivae normal.     Pupils: Pupils are equal, round, and reactive to light.  Neck:     Thyroid: No thyromegaly.  Cardiovascular:     Rate and Rhythm: Normal rate and regular rhythm.     Heart sounds: No murmur heard. Pulmonary:     Effort: Pulmonary effort is normal.     Breath sounds: Normal breath sounds. No rales.  Abdominal:     General: Bowel sounds are  normal.     Palpations: Abdomen is soft.     Tenderness: There is no abdominal tenderness.  Musculoskeletal:        General: Normal range of motion.     Cervical back: Neck supple.  Lymphadenopathy:     Cervical: No cervical adenopathy.  Skin:    General: Skin is warm and dry.     Findings: No rash.  Neurological:     Mental Status: She is alert and oriented to person, place, and time.     Cranial Nerves: No cranial nerve deficit.     Deep Tendon Reflexes: Reflexes are normal and symmetric.  Psychiatric:        Mood and Affect: Mood normal.          Assessment & Plan:      This visit occurred during the SARS-CoV-2 public health emergency.  Safety protocols were in place, including screening questions prior to the visit, additional usage of staff PPE, and extensive cleaning of exam room while observing appropriate contact time as indicated for disinfecting solutions.

## 2020-11-05 NOTE — Assessment & Plan Note (Signed)
Immunizations UTD. Mammogram due, orders placed. Colonoscopy may be due, she will call GI.  Discussed the importance of a healthy diet and regular exercise in order for weight loss, and to reduce the risk of further co-morbidity.  Exam today stable.  Labs pending.

## 2020-11-05 NOTE — Assessment & Plan Note (Signed)
Following with podiatry, received cortisone injection recently.   Continue Cymbalta 60 mg daily and Tramadol 25 mg as needed. She takes 25 mg of Tramadol only when working, two days week mostly.

## 2020-11-05 NOTE — Assessment & Plan Note (Signed)
Well controlled on oxybutynin XL 5 mg, continue same.

## 2020-11-05 NOTE — Assessment & Plan Note (Signed)
Appears to be doing well on Cymbalta 60 mg daily, continue same.

## 2020-11-13 ENCOUNTER — Institutional Professional Consult (permissible substitution): Payer: 59 | Admitting: Neurology

## 2020-12-14 ENCOUNTER — Other Ambulatory Visit: Payer: Self-pay | Admitting: Primary Care

## 2020-12-14 DIAGNOSIS — R32 Unspecified urinary incontinence: Secondary | ICD-10-CM

## 2020-12-17 DIAGNOSIS — M79676 Pain in unspecified toe(s): Secondary | ICD-10-CM

## 2020-12-22 ENCOUNTER — Telehealth: Payer: Self-pay | Admitting: Primary Care

## 2020-12-22 ENCOUNTER — Other Ambulatory Visit: Payer: Self-pay | Admitting: Primary Care

## 2020-12-22 DIAGNOSIS — M79671 Pain in right foot: Secondary | ICD-10-CM

## 2020-12-22 DIAGNOSIS — G609 Hereditary and idiopathic neuropathy, unspecified: Secondary | ICD-10-CM

## 2020-12-22 DIAGNOSIS — M722 Plantar fascial fibromatosis: Secondary | ICD-10-CM

## 2020-12-22 NOTE — Telephone Encounter (Signed)
  Encourage patient to contact the pharmacy for refills or they can request refills through [facility]:  Please schedule appointment if longer than 1 year  NEXT APPOINTMENT DATE:No future appt  MEDICATION:traMADol (ULTRAM) 50 MG tablet,SUMAtriptan (IMITREX) 50 MG tablet  Is the patient out of medication? no  PHARMACY:[id number] - [place], [place] - [address]  Let patient know to contact pharmacy at the end of the day to make sure medication is ready.  Please notify patient to allow 48-72 hours to process  CLINICAL FILLS OUT ALL BELOW:   LAST REFILL:  QTY:  REFILL DATE:    OTHER COMMENTS:    Okay for refill?  Please advise

## 2020-12-22 NOTE — Telephone Encounter (Signed)
Called patient she does not need Imitrex she did not know she had refill called in with script in may.  She does need refill on tramadol. Last given 11/05/2020 #30 with no refills.

## 2020-12-23 NOTE — Telephone Encounter (Signed)
Spoke to patient states that she has had increased foot pain. Has app with Dr. Amalia Hailey on 10/25. Has been taking more. States that some times she takes daily and some times takes a whole tab. She states that she has had to take 1/2 to 1 tab daily this week. Has 10 tab left.   She wanted to let you know she is trying to get disability due to foot pain.

## 2020-12-23 NOTE — Telephone Encounter (Signed)
Please thank patient for the information. As mentioned before, I don't treat chronic pain with routine medications such as Tramadol, only for "as needed use".   She should try to stretch out the last 10 tablets, notify us when she's getting closer to the end. Do NOT recommend she take Tramadol daily if she can avoid it.

## 2020-12-23 NOTE — Telephone Encounter (Signed)
Please call patient:  From our discussion on 11/05/20 she is taking 1/2 tablet of Tramadol twice weekly, so basically one tablet once weekly. I gave her 30 pills so she should have plenty left over and should not need a refill   Is she taking more than what we discussed?

## 2020-12-26 NOTE — Telephone Encounter (Signed)
Left message to return call to our office.  

## 2020-12-29 NOTE — Telephone Encounter (Signed)
Called patient has appointment with foot doctor tomorrow. Has 6 tab left. Informed for her to discuses with them at that visit. Also to let our office know when she is down to 3 tab left.

## 2020-12-29 NOTE — Telephone Encounter (Signed)
Pt returned call would like a call back. Wants to know the status on refill [phone]

## 2020-12-29 NOTE — Telephone Encounter (Signed)
Pt called to let Anda Kraft know that she found 13 pills in her car

## 2020-12-30 ENCOUNTER — Other Ambulatory Visit: Payer: Self-pay

## 2020-12-30 ENCOUNTER — Ambulatory Visit (INDEPENDENT_AMBULATORY_CARE_PROVIDER_SITE_OTHER): Payer: 59 | Admitting: Podiatry

## 2020-12-30 ENCOUNTER — Encounter (INDEPENDENT_AMBULATORY_CARE_PROVIDER_SITE_OTHER): Payer: Self-pay

## 2020-12-30 ENCOUNTER — Other Ambulatory Visit: Payer: Self-pay | Admitting: Family Medicine

## 2020-12-30 DIAGNOSIS — M722 Plantar fascial fibromatosis: Secondary | ICD-10-CM

## 2020-12-30 DIAGNOSIS — G609 Hereditary and idiopathic neuropathy, unspecified: Secondary | ICD-10-CM

## 2020-12-30 MED ORDER — DICLOFENAC SODIUM 75 MG PO TBEC: 75.0000 mg | DELAYED_RELEASE_TABLET | Freq: Two times a day (BID) | ORAL | 1 refills | Status: DC

## 2020-12-30 MED ORDER — BETAMETHASONE SOD PHOS & ACET 6 (3-3) MG/ML IJ SUSP
3.0000 mg | Freq: Once | INTRAMUSCULAR | Status: AC
  Administered 2020-12-30: 3 mg via INTRA_ARTICULAR

## 2020-12-30 NOTE — Telephone Encounter (Signed)
Patient informed will let us know when she is running out

## 2020-12-30 NOTE — Progress Notes (Signed)
Neuro  Subjective: 55 year old female presenting today for follow up evaluation of bilateral plantar fasciitis and idiopathic peripheral neuropathy.  Patient states that she has been taking the tramadol and Cymbalta from her PCP.  Patient is requesting something stronger for pain.  She says that she is only prescribed 30 pills of tramadol which is expected to last for 2 months.  She states that she has pain despite taking only half a pill a day.  She is doing well but she has had an increase of pain and tenderness to the plantar heels for the past few months.  She says the injections helped significantly.   Past Medical History:  Diagnosis Date   Acquired pyloric stricture    Bleeding duodenal ulcer    Chronic esophagogastric ulcer    Depression    Duodenal obstruction    Gastric outlet obstruction 01/19/2018   GERD (gastroesophageal reflux disease)    Hair loss 09/22/2018   History of stomach ulcers    Hypothyroidism    Incisional hernia, without obstruction or gangrene    Intractable vomiting    Kidney stones    Migraine    Migraine headache    only1-2x/month since starting topamax   Neuropathy    Side pain 03/30/2018     Objective: Physical Exam General: The patient is alert and oriented x3 in no acute distress.  Dermatology: Skin is warm, dry and supple bilateral lower extremities. Negative for open lesions or macerations bilateral.   Vascular: Dorsalis Pedis and Posterior Tibial pulses palpable bilateral.  Capillary fill time is immediate to all digits.  Neurological: Epicritic and protective threshold intact bilateral.   Musculoskeletal: Tenderness to palpation to the plantar aspect of the bilateral heels along the plantar fascia. All other joints range of motion within normal limits bilateral. Strength 5/5 in all groups bilateral.   Assessment: 1. plantar fasciitis bilateral feet  2. Idiopathic peripheral neuropathy bilateral   Plan of Care:  1. Patient evaluated.   2. Injection of 0.5cc Celestone soluspan injected into the bilateral heels.  3.  Continue Cymbalta as per PCP 4.  Continue tramadol 50 mg as per PCP 5.  Continue wearing good supportive shoes and arch supports 6.  OTC power step insoles were provided for the patient to wear daily  7.  Prescription for diclofenac 75 mg 2 times daily as needed  8.  Return to clinic as needed  Working 2 days/week at [employer].  Edrick Kins, DPM [facility]  Dr. Edrick Kins, DPM    [address], [place] [address]                Office [phone]  Fax [phone]

## 2020-12-30 NOTE — Telephone Encounter (Addendum)
Noted. Please thank her for that update and have her notify when she has a few pills remaining.   Remind her to use sparingly when needed.

## 2020-12-31 ENCOUNTER — Ambulatory Visit: Payer: 59 | Admitting: Family Medicine

## 2021-01-01 ENCOUNTER — Other Ambulatory Visit: Payer: Self-pay

## 2021-01-01 MED ORDER — PANTOPRAZOLE SODIUM 40 MG PO TBEC: 40.0000 mg | DELAYED_RELEASE_TABLET | Freq: Every day | ORAL | 3 refills | Status: DC

## 2021-01-05 ENCOUNTER — Telehealth: Payer: Self-pay | Admitting: *Deleted

## 2021-01-05 NOTE — Telephone Encounter (Signed)
Patient is calling because she is having upset stomach, diarrhea and headache since starting the medicine(Diclofenac). Is there something else that can be prescribed?Please advise.

## 2021-01-05 NOTE — Telephone Encounter (Signed)
No. Discontinue diclofenac. Recommend OTC motrin as needed. - Dr. Amalia Hailey

## 2021-01-06 ENCOUNTER — Other Ambulatory Visit: Payer: Self-pay | Admitting: Podiatry

## 2021-01-06 ENCOUNTER — Telehealth: Payer: Self-pay | Admitting: *Deleted

## 2021-01-06 NOTE — Telephone Encounter (Signed)
Spoke with patient on the phone. Discontinue Diclofenac. Continue Tramadol from Alma Friendly NP. No additional prescriptions sent. - Dr. Amalia Hailey

## 2021-01-06 NOTE — Telephone Encounter (Signed)
"  I just come to see you all last week, Dr. Amalia Hailey.  He put me on some new medicine that caused an upset stomach and I have a constant headache being on it.  It's one of the two side effects.  You might need to put me on something else, another pain medicine for my feet.  Yesterday I called at 9 am and left a message, then I called today at 9 am and gave it to the reception.  Now I'm calling again, it's almost 3 pm today, to see what you want to do.  Call me back."

## 2021-01-06 NOTE — Telephone Encounter (Signed)
Patient called today stating she called yesterday about the RX Dr Amalia Hailey gave to pt. Patient states she is having diarrhea and bad headache. Patient wanted to know if she can get something else. Patients tele number is [phone].

## 2021-01-08 NOTE — Telephone Encounter (Signed)
Patient has been notified to discontinue diclofenac and to take OTC motrin, has spoken with the physician as well.

## 2021-02-24 ENCOUNTER — Encounter: Payer: Self-pay | Admitting: Gastroenterology

## 2021-02-24 ENCOUNTER — Ambulatory Visit (INDEPENDENT_AMBULATORY_CARE_PROVIDER_SITE_OTHER): Payer: Self-pay | Admitting: Gastroenterology

## 2021-02-24 VITALS — BP 123/72 | HR 103 | Temp 98.1°F | Ht 59.0 in | Wt 118.4 lb

## 2021-02-24 DIAGNOSIS — K219 Gastro-esophageal reflux disease without esophagitis: Secondary | ICD-10-CM

## 2021-02-24 MED ORDER — PANTOPRAZOLE SODIUM 40 MG PO TBEC: 40.0000 mg | DELAYED_RELEASE_TABLET | Freq: Every day | ORAL | 3 refills | Status: DC

## 2021-02-24 NOTE — Progress Notes (Signed)
Primary Care Physician: Pleas Koch, NP  Primary Gastroenterologist:  Dr. [name]  Chief Complaint  Patient presents with   Medication Refill    HPI: Tanya Nguyen is a 55 y.o. female here for follow-up with a history of irritable bowel syndrome with constipation predominance.  The patient had been tried on Linzess and Trulance with having diarrhea.  The patient was then switched to Amitiza the last time she saw me back in 2020.  She also takes a PPI for her GERD. The patient denies any bowel problems and states that her constipation is under control without taking any medication.  She has continued heartburn when she stops her medication.  She is now here for refill of her pantoprazole.  Past Medical History:  Diagnosis Date   Acquired pyloric stricture    Bleeding duodenal ulcer    Chronic esophagogastric ulcer    Depression    Duodenal obstruction    Gastric outlet obstruction 01/19/2018   GERD (gastroesophageal reflux disease)    Hair loss 09/22/2018   History of stomach ulcers    Hypothyroidism    Incisional hernia, without obstruction or gangrene    Intractable vomiting    Kidney stones    Migraine    Migraine headache    only1-2x/month since starting topamax   Neuropathy    Side pain 03/30/2018    Current Outpatient Medications  Medication Sig Dispense Refill   cetirizine (ZYRTEC) 10 MG tablet TAKE 1 TABLET BY MOUTH EVERY DAY AS NEEDED FOR ALLERGY 90 tablet 1   cyclobenzaprine (FLEXERIL) 5 MG tablet Take 1 tablet (5 mg total) by mouth 2 (two) times daily as needed for muscle spasms (migraine (sedation precautions)). 30 tablet 0   diclofenac (VOLTAREN) 75 MG EC tablet Take 1 tablet (75 mg total) by mouth 2 (two) times daily. 60 tablet 1   DULoxetine (CYMBALTA) 60 MG capsule Take 1 capsule (60 mg total) by mouth daily. For anxiety and pain. 90 capsule 1   fluticasone (FLONASE) 50 MCG/ACT nasal spray Place 1 spray into both nostrils 2 (two) times daily. 16 g  0   levothyroxine (SYNTHROID) 88 MCG tablet TAKE 1 TABLET BY MOUTH EVERY MORNING ON EMPTY STOMACH WITH WATER ONLY-NO FOOD OR OTHER MED X 30 MIN 90 tablet 3   ondansetron (ZOFRAN-ODT) 4 MG disintegrating tablet Take 1 tablet (4 mg total) by mouth every 8 (eight) hours as needed for nausea. 20 tablet 0   oxybutynin (DITROPAN-XL) 5 MG 24 hr tablet TAKE 1 TABLET (5 MG TOTAL) BY MOUTH AT BEDTIME. FOR BLADDER INCONTINENCE. 90 tablet 2   SUMAtriptan (IMITREX) 50 MG tablet TAKE 1 TABLET BY MOUTH (MAY REPEAT IN 2 HOURS IF HEADACHE PERSISTS OR RECURS.) 9 tablet 3   topiramate (TOPAMAX) 100 MG tablet Take 1 tablet (100 mg total) by mouth at bedtime. For migraine prevention. 90 tablet 1   traMADol (ULTRAM) 50 MG tablet Take 1/2 tablet by mouth once daily as needed for severe foot pain. 30 tablet 0   traZODone (DESYREL) 50 MG tablet Take 3 tablets (150 mg total) by mouth at bedtime. 270 tablet 1   pantoprazole (PROTONIX) 40 MG tablet Take 1 tablet (40 mg total) by mouth daily. (Patient not taking: Reported on 02/24/2021) 30 tablet 3   No current facility-administered medications for this visit.    Allergies as of 02/24/2021 - Review Complete 02/24/2021  Allergen Reaction Noted   Penicillins Shortness Of Breath, Diarrhea, and Nausea And Vomiting 02/26/2015  Gabapentin  11/14/2018   Pregabalin  04/21/2020   Sulfa antibiotics Nausea And Vomiting 06/26/2012    ROS:  General: Negative for anorexia, weight loss, fever, chills, fatigue, weakness. ENT: Negative for hoarseness, difficulty swallowing , nasal congestion. CV: Negative for chest pain, angina, palpitations, dyspnea on exertion, peripheral edema.  Respiratory: Negative for dyspnea at rest, dyspnea on exertion, cough, sputum, wheezing.  GI: See history of present illness. GU:  Negative for dysuria, hematuria, urinary incontinence, urinary frequency, nocturnal urination.  Endo: Negative for unusual weight change.    Physical Examination:   BP  123/72    Pulse (!) 103    Temp 98.1 F (36.7 C) (Oral)    Ht 4\' 11"  (1.499 m)    Wt 118 lb 6.4 oz (53.7 kg)    BMI 23.91 kg/m   General: Well-nourished, well-developed in no acute distress.  Eyes: No icterus. Conjunctivae pink. Neuro: Alert and oriented x 3.  Grossly intact. Skin: Warm and dry, no jaundice.   Psych: Alert and cooperative, normal mood and affect.  Labs:    Imaging Studies: No results found.  Assessment and Plan:   Tanya Nguyen is a 55 y.o. y/o female who comes in today with a history of reflux and irritable bowel syndrome with constipation predominance.  The patient states that her constipation is under good control without any medications at the present time. The patient is now asking for a refill of her Protonix.  The patient has been explained the risks of PPIs including decreased absorption of calcium iron and magnesium in addition to an increase infection with C. Difficile colitis.  The patient agrees to continuing the medication.     [name], MD. [name]    Note: This dictation was prepared with Dragon dictation along with smaller phrase technology. Any transcriptional errors that result from this process are unintentional.

## 2021-02-26 ENCOUNTER — Other Ambulatory Visit: Payer: Self-pay | Admitting: Primary Care

## 2021-02-26 ENCOUNTER — Other Ambulatory Visit: Payer: Self-pay | Admitting: Family Medicine

## 2021-02-26 DIAGNOSIS — G43111 Migraine with aura, intractable, with status migrainosus: Secondary | ICD-10-CM

## 2021-02-26 DIAGNOSIS — E038 Other specified hypothyroidism: Secondary | ICD-10-CM

## 2021-03-03 ENCOUNTER — Telehealth: Payer: Self-pay | Admitting: Primary Care

## 2021-03-03 DIAGNOSIS — G609 Hereditary and idiopathic neuropathy, unspecified: Secondary | ICD-10-CM

## 2021-03-03 DIAGNOSIS — G43111 Migraine with aura, intractable, with status migrainosus: Secondary | ICD-10-CM

## 2021-03-03 DIAGNOSIS — M722 Plantar fascial fibromatosis: Secondary | ICD-10-CM

## 2021-03-03 DIAGNOSIS — M79672 Pain in left foot: Secondary | ICD-10-CM

## 2021-03-03 NOTE — Telephone Encounter (Signed)
°  Encourage patient to contact the pharmacy for refills or they can request refills through [facility]:  Please schedule appointment if longer than 1 year  NEXT APPOINTMENT DATE:  MEDICATION:traMADol (ULTRAM) 50 MG tablet  SUMAtriptan (IMITREX) 50 MG tablet   Is the patient out of medication?   PH[facility]ACY:[id number] - [place], [place] - [address]   Let patient know to contact pharmacy at the end of the day to make sure medication is ready.  Please notify patient to allow 48-72 hours to process  CLINICAL FILLS OUT ALL BELOW:   LAST REFILL:  QTY:  REFILL DATE:    OTHER COMMENTS:    Okay for refill?  Please advise

## 2021-03-04 ENCOUNTER — Other Ambulatory Visit: Payer: Self-pay | Admitting: Primary Care

## 2021-03-04 MED ORDER — SUMATRIPTAN SUCCINATE 50 MG PO TABS: ORAL_TABLET | ORAL | 0 refills | Status: DC

## 2021-03-04 MED ORDER — TRAMADOL HCL 50 MG PO TABS: ORAL_TABLET | ORAL | 0 refills | Status: DC

## 2021-03-04 NOTE — Telephone Encounter (Signed)
patient called, she is completely out of her and she is completely out of traMADol (ULTRAM) 50 MG tablet  SUMAtriptan (IMITREX) 50 MG tablet   Migraine the last two days  Needs refill sent over  If not able to please call the patient [phone]

## 2021-03-04 NOTE — Telephone Encounter (Signed)
Noted. Refill(s) sent to pharmacy.  

## 2021-03-18 DIAGNOSIS — M79676 Pain in unspecified toe(s): Secondary | ICD-10-CM

## 2021-03-31 ENCOUNTER — Encounter: Payer: Self-pay | Admitting: Podiatry

## 2021-03-31 ENCOUNTER — Ambulatory Visit (INDEPENDENT_AMBULATORY_CARE_PROVIDER_SITE_OTHER): Payer: 59 | Admitting: Podiatry

## 2021-03-31 ENCOUNTER — Encounter: Payer: Self-pay | Admitting: *Deleted

## 2021-03-31 ENCOUNTER — Other Ambulatory Visit: Payer: Self-pay

## 2021-03-31 DIAGNOSIS — G609 Hereditary and idiopathic neuropathy, unspecified: Secondary | ICD-10-CM

## 2021-03-31 DIAGNOSIS — M722 Plantar fascial fibromatosis: Secondary | ICD-10-CM | POA: Diagnosis not present

## 2021-03-31 MED ORDER — TRAMADOL HCL 50 MG PO TABS: 50.0000 mg | ORAL_TABLET | Freq: Three times a day (TID) | ORAL | 0 refills | Status: AC | PRN

## 2021-03-31 NOTE — Progress Notes (Signed)
Neuro  Subjective: 56 year old female presenting today for follow up evaluation of bilateral plantar fasciitis and idiopathic peripheral neuropathy.  Patient continues to have severe bilateral heel pain.  She says the injections helped temporarily..  The patient has now had this pain for over 6 months now despite conservative treatment modalities.  She presents for further treatment and evaluation   Past Medical History:  Diagnosis Date   Acquired pyloric stricture    Bleeding duodenal ulcer    Chronic esophagogastric ulcer    Depression    Duodenal obstruction    Gastric outlet obstruction 01/19/2018   GERD (gastroesophageal reflux disease)    Hair loss 09/22/2018   History of stomach ulcers    Hypothyroidism    Incisional hernia, without obstruction or gangrene    Intractable vomiting    Kidney stones    Migraine    Migraine headache    only1-2x/month since starting topamax   Neuropathy    Side pain 03/30/2018     Objective: Physical Exam General: The patient is alert and oriented x3 in no acute distress.  Dermatology: Skin is warm, dry and supple bilateral lower extremities. Negative for open lesions or macerations bilateral.   Vascular: Dorsalis Pedis and Posterior Tibial pulses palpable bilateral.  Capillary fill time is immediate to all digits.  Neurological: Epicritic and protective threshold intact bilateral.  Patient does experience burning with pins-and-needles to the bilateral feet daily  Musculoskeletal: There continues to be chronic severe tenderness to palpation to the plantar aspect of the bilateral heels along the plantar fascia. All other joints range of motion within normal limits bilateral. Strength 5/5 in all groups bilateral.   Assessment: 1. plantar fasciitis bilateral feet  2. Idiopathic peripheral neuropathy bilateral   Plan of Care:  1. Patient evaluated.  2. Injection of 0.5cc Celestone soluspan injected into the bilateral heels.  3.  Continue  Cymbalta as per PCP.  Patient states that she experienced alopecia with the gabapentin.  She says she was allergic to it as well 4.  Continue tramadol 50 mg.  Refill provided tramadol 50 mg #30 every 8 hours as needed pain 5.  Continue wearing good supportive shoes and arch supports 6.  OTC power step insoles were provided for the patient to wear daily  7.  Patient no longer takes the diclofenac.  She says it did not help 8.  Today were going to order an MRI bilateral feet.  The patient has not had the severe plantar fasciitis for over 6 months despite conservative treatment modalities.   9.  Return to clinic as needed  Working 3 days/week at the [employer].  Edrick Kins, DPM [facility]  Dr. Edrick Kins, DPM    [address], [place] [address]                Office [phone]  Fax [phone]

## 2021-04-02 ENCOUNTER — Telehealth: Payer: Self-pay | Admitting: Primary Care

## 2021-04-02 DIAGNOSIS — M79671 Pain in right foot: Secondary | ICD-10-CM

## 2021-04-02 DIAGNOSIS — G609 Hereditary and idiopathic neuropathy, unspecified: Secondary | ICD-10-CM

## 2021-04-02 MED ORDER — CYCLOBENZAPRINE HCL 5 MG PO TABS: 5.0000 mg | ORAL_TABLET | Freq: Two times a day (BID) | ORAL | 0 refills | Status: DC | PRN

## 2021-04-02 MED ORDER — DULOXETINE HCL 60 MG PO CPEP: 60.0000 mg | ORAL_CAPSULE | Freq: Every day | ORAL | 1 refills | Status: DC

## 2021-04-02 NOTE — Telephone Encounter (Signed)
°  Encourage patient to contact the pharmacy for refills or they can request refills through [facility]:  Please schedule appointment if longer than 1 year  NEXT APPOINTMENT DATE:  MEDICATION:DULoxetine (CYMBALTA) 60 MG capsule,cyclobenzaprine (FLEXERIL) 5 MG tablet  Is the patient out of medication?   PHARMACY:[id number] - [place], [place] - [address]  Let patient know to contact pharmacy at the end of the day to make sure medication is ready.  Please notify patient to allow 48-72 hours to process  CLINICAL FILLS OUT ALL BELOW:   LAST REFILL:  QTY:  REFILL DATE:    OTHER COMMENTS:    Okay for refill?  Please advise

## 2021-04-02 NOTE — Telephone Encounter (Signed)
Refills sent to pharmacy. 

## 2021-04-06 ENCOUNTER — Telehealth: Payer: Self-pay

## 2021-04-06 NOTE — Telephone Encounter (Signed)
Patient needs an office visit for this.  Please schedule.

## 2021-04-06 NOTE — Telephone Encounter (Signed)
Called patient she is not able to come to [place] have made appointment for 2/9 at our office.

## 2021-04-06 NOTE — Telephone Encounter (Signed)
Pt called stating that she would like to increase her cymbalta to 120 mg daily due to increased foot pain/problems.  Pt is having an MRI next Monday on her feet and may have to have surgery.

## 2021-04-09 ENCOUNTER — Telehealth: Payer: Self-pay | Admitting: *Deleted

## 2021-04-09 NOTE — Telephone Encounter (Signed)
Tanya Nguyen w/ [facility] ([id number],NGW. [id number])needing a prior Chief Strategy Officer.for MRI left heel w/o contrast, right foot w/o contrast,has the [employer], she is scheduled for 04/13/21.

## 2021-04-10 ENCOUNTER — Telehealth: Payer: Self-pay | Admitting: Podiatry

## 2021-04-10 ENCOUNTER — Telehealth: Payer: Self-pay

## 2021-04-10 NOTE — Telephone Encounter (Signed)
Melissa from [employer] called stating this patient is having 2 MRI's on Monday and needs prior authorization. Patient has [employer]. Melissa's number is [phone] ext [phone].

## 2021-04-10 NOTE — Telephone Encounter (Signed)
PA request form has been faxed as urgernt to [employer], pending approval

## 2021-04-13 ENCOUNTER — Ambulatory Visit: Payer: 59

## 2021-04-13 NOTE — Telephone Encounter (Signed)
Pt called back requesting you to give her a call whenever you're notified of approval.

## 2021-04-13 NOTE — Telephone Encounter (Signed)
Disregard message, insurance approved it.

## 2021-04-14 ENCOUNTER — Telehealth: Payer: Self-pay

## 2021-04-14 NOTE — Telephone Encounter (Signed)
Per Jonelle Sidle with [employer], MRI's have been approved from 04/10/2021 to 07/08/2021 Auth # [id number]

## 2021-04-17 ENCOUNTER — Ambulatory Visit: Payer: Self-pay | Admitting: Primary Care

## 2021-04-19 ENCOUNTER — Ambulatory Visit
Admission: RE | Admit: 2021-04-19 | Discharge: 2021-04-19 | Disposition: A | Discharge status: Home / Self Care | Payer: 59 | Source: Ambulatory Visit | Attending: Podiatry | Admitting: Podiatry

## 2021-04-19 ENCOUNTER — Other Ambulatory Visit: Payer: Self-pay

## 2021-04-19 DIAGNOSIS — M722 Plantar fascial fibromatosis: Secondary | ICD-10-CM | POA: Diagnosis not present

## 2021-04-21 ENCOUNTER — Ambulatory Visit: Payer: 59 | Admitting: Podiatry

## 2021-04-24 ENCOUNTER — Encounter: Payer: Self-pay | Admitting: Podiatry

## 2021-04-24 ENCOUNTER — Other Ambulatory Visit: Payer: Self-pay

## 2021-04-24 ENCOUNTER — Ambulatory Visit (INDEPENDENT_AMBULATORY_CARE_PROVIDER_SITE_OTHER): Payer: 59 | Admitting: Podiatry

## 2021-04-24 DIAGNOSIS — G609 Hereditary and idiopathic neuropathy, unspecified: Secondary | ICD-10-CM

## 2021-04-27 ENCOUNTER — Telehealth: Payer: Self-pay

## 2021-04-27 NOTE — Telephone Encounter (Signed)
-----   Message from Edrick Kins, DPM sent at 04/24/2021  2:36 PM EST ----- Regarding: Small fiber nerve density test Patient is coming back in 2 weeks.  We need to make sure that we have the [facility] Kits for the small nerve fiber density test.  Thanks, Dr. Amalia Hailey

## 2021-04-27 NOTE — Telephone Encounter (Signed)
2 Small fiber nerve density test have been ordered from [facility]

## 2021-04-29 ENCOUNTER — Ambulatory Visit (INDEPENDENT_AMBULATORY_CARE_PROVIDER_SITE_OTHER): Payer: 59 | Admitting: Primary Care

## 2021-04-29 ENCOUNTER — Other Ambulatory Visit: Payer: Self-pay

## 2021-04-29 ENCOUNTER — Encounter: Payer: Self-pay | Admitting: Primary Care

## 2021-04-29 VITALS — BP 118/64 | HR 89 | Temp 98.6°F | Ht 59.0 in | Wt 119.0 lb

## 2021-04-29 DIAGNOSIS — M722 Plantar fascial fibromatosis: Secondary | ICD-10-CM

## 2021-04-29 DIAGNOSIS — G609 Hereditary and idiopathic neuropathy, unspecified: Secondary | ICD-10-CM | POA: Diagnosis not present

## 2021-04-29 DIAGNOSIS — F3130 Bipolar disorder, current episode depressed, mild or moderate severity, unspecified: Secondary | ICD-10-CM | POA: Diagnosis not present

## 2021-04-29 MED ORDER — DULOXETINE HCL 30 MG PO CPEP: 30.0000 mg | ORAL_CAPSULE | Freq: Every day | ORAL | 1 refills | Status: DC

## 2021-04-29 NOTE — Assessment & Plan Note (Signed)
Uncontrolled.  Follows with podiatry, office notes from January 2023 reviewed. MRI from February 2023 reviewed.  Agree to increase her dose of Cymbalta to 90 mg. We will continue her 60 mg dose and add a 30 mg dose. She understands.   Continue follow up with podiatry as scheduled.

## 2021-04-29 NOTE — Progress Notes (Signed)
Subjective:    Patient ID: Tanya Nguyen, female    DOB: 01-18-1966, 56 y.o.   MRN: [id number]  HPI  Tanya Nguyen is a very pleasant 56 y.o. female with a history of migraines, GERD, hypothyroidism, idiopathic peripheral neuropathy, bilateral planter fasciitis bipolar disorder, fatigue, constipation, who presents today to discuss foot pain.  Chronic bilateral plantar fasciitis, following with podiatry, last office visit was in January 2023.  During her podiatry visit she continued to experience pain from plantar fasciitis and idiopathic peripheral neuropathy.  She has undergone numerous rounds of injections which helped temporarily.  She underwent MRI of the bilateral feet in February 2023 which revealed mild tendinosis of the peroneus brevis to left side, and mild tendinosis of peroneus brevis with longitudinal split tear distal to the medial malleolus to right side.  She has a follow up visit scheduled with her podiatrist in a few weeks. She was told that she has chronic nerve damage to her feet. She was referred to another foot specialist.   Today she would like to discuss further management of her ongoing foot pain.  She is managed on tramadol for which she takes quite frequently due to pain.  Frequent use of tramadol has been discouraged given the risk for dependence.  She is also managed on Cymbalta for anxiety/depression/pain.  She cannot take gabapentin as it caused alopecia.  She continues to experience a burning pain to the bilateral plantar feet with an aching sensation to her dorsal feet and ankles. She questions whether a dose increase of her Cymbalta will help. Cymbalta has helped some with her pain.   She continues to work part time, is down to two days weekly as she cannot work any additional days.   BP Readings from Last 3 Encounters:  04/29/21 118/64  02/24/21 123/72  11/05/20 (!) 118/42      Review of Systems  Musculoskeletal:  Positive for arthralgias.  Skin:   Positive for color change.  Neurological:  Positive for numbness.       Chronic bilateral plantar foot pain        Past Medical History:  Diagnosis Date   Acquired pyloric stricture    Bleeding duodenal ulcer    Chronic esophagogastric ulcer    Depression    Duodenal obstruction    Gastric outlet obstruction 01/19/2018   GERD (gastroesophageal reflux disease)    Hair loss 09/22/2018   History of stomach ulcers    Hypothyroidism    Incisional hernia, without obstruction or gangrene    Intractable vomiting    Kidney stones    Migraine    Migraine headache    only1-2x/month since starting topamax   Neuropathy    Side pain 03/30/2018    Social History   Socioeconomic History   Marital status: Married    Spouse name: Not on file   Number of children: 0   Years of education: 12   Highest education level: High school graduate  Occupational History   Occupation: Homemaker  Tobacco Use   Smoking status: Never   Smokeless tobacco: Never  Vaping Use   Vaping Use: Never used  Substance and Sexual Activity   Alcohol use: Not Currently    Alcohol/week: 0.0 standard drinks   Drug use: No   Sexual activity: Yes    Birth control/protection: None  Other Topics Concern   Not on file  Social History Narrative   Lives at home with her husband.   Right-handed.   Rare caffeine.  Social Determinants of Health   Financial Resource Strain: Not on file  Food Insecurity: Not on file  Transportation Needs: Not on file  Physical Activity: Not on file  Stress: Not on file  Social Connections: Not on file  Intimate Partner Violence: Not on file    Past Surgical History:  Procedure Laterality Date   ABDOMINAL HYSTERECTOMY  2004   partial   CHOLECYSTECTOMY     COLONOSCOPY WITH PROPOFOL N/A 02/27/2018   Procedure: COLONOSCOPY WITH PROPOFOL;  Surgeon: [name], MD;  Location: [facility];  Service: Endoscopy;  Laterality: N/A;   ESOPHAGOGASTRODUODENOSCOPY (EGD) WITH  PROPOFOL N/A 01/12/2018   Procedure: ESOPHAGOGASTRODUODENOSCOPY (EGD) WITH BIOPSIES;  Surgeon: [name], MD;  Location: [facility];  Service: Endoscopy;  Laterality: N/A;   ESOPHAGOGASTRODUODENOSCOPY (EGD) WITH PROPOFOL N/A 01/25/2018   Procedure: ESOPHAGOGASTRODUODENOSCOPY (EGD) WITH PROPOFOL;  Surgeon: Jonathon Bellows, MD;  Location: [facility];  Service: Gastroenterology;  Laterality: N/A;   ESOPHAGOGASTRODUODENOSCOPY (EGD) WITH PROPOFOL N/A 02/27/2018   Procedure: ESOPHAGOGASTRODUODENOSCOPY (EGD) WITH PROPOFOL;  Surgeon: [name], MD;  Location: [facility];  Service: Endoscopy;  Laterality: N/A;   INCISIONAL HERNIA REPAIR N/A 11/23/2018   Procedure: HERNIA REPAIR INCISIONAL;  Surgeon: Olean Ree, MD;  Location: [facility];  Service: General;  Laterality: N/A;   INSERTION OF MESH N/A 11/23/2018   Procedure: INSERTION OF MESH;  Surgeon: Olean Ree, MD;  Location: [facility];  Service: General;  Laterality: N/A;   LAPAROTOMY N/A 01/20/2018   Procedure: EXPLORATORY LAPAROTOMY;  Surgeon: Olean Ree, MD;  Location: [facility];  Service: General;  Laterality: N/A;   TOOTH EXTRACTION      Family History  Problem Relation Age of Onset   Cancer Mother    Breast cancer Mother 28   Lung cancer Father    Alcohol abuse Sister    Bipolar disorder Sister    Alcohol abuse Maternal Grandmother    Stroke Maternal Grandmother    Stroke Paternal Grandmother     Allergies  Allergen Reactions   Penicillins Shortness Of Breath, Diarrhea and Nausea And Vomiting    Has patient had a PCN reaction causing immediate rash, facial/tongue/throat swelling, SOB or lightheadedness with hypotension: yes Has patient had a PCN reaction causing severe rash involving mucus membranes or skin necrosis: no Has patient had a PCN reaction that required hospitalization no Has patient had a PCN reaction occurring within the last 10 years: about 10 years If all of the above answers are "NO", then may  proceed with Cephalosporin use.    Gabapentin     Hair loss   Pregabalin     Tremors, dropped items, tingling   Sulfa Antibiotics Nausea And Vomiting    Other reaction(s): VOMITING    Current Outpatient Medications on File Prior to Visit  Medication Sig Dispense Refill   cetirizine (ZYRTEC) 10 MG tablet TAKE 1 TABLET BY MOUTH EVERY DAY AS NEEDED FOR ALLERGY 90 tablet 1   cyclobenzaprine (FLEXERIL) 5 MG tablet Take 1 tablet (5 mg total) by mouth 2 (two) times daily as needed for muscle spasms (migraine (sedation precautions)). 30 tablet 0   diclofenac (VOLTAREN) 75 MG EC tablet Take 1 tablet (75 mg total) by mouth 2 (two) times daily. 60 tablet 1   DULoxetine (CYMBALTA) 60 MG capsule Take 1 capsule (60 mg total) by mouth daily. For anxiety and pain. 90 capsule 1   fluticasone (FLONASE) 50 MCG/ACT nasal spray Place 1 spray into both nostrils 2 (two) times daily. [place]  g 0   levothyroxine (SYNTHROID) 88 MCG tablet TAKE 1 TABLET BY MOUTH EVERY MORNING ON EMPTY STOMACH WITH WATER ONLY-NO FOOD OR OTHER MED FOR 30MIN 90 tablet 1   ondansetron (ZOFRAN-ODT) 4 MG disintegrating tablet Take 1 tablet (4 mg total) by mouth every 8 (eight) hours as needed for nausea. 20 tablet 0   oxybutynin (DITROPAN-XL) 5 MG 24 hr tablet TAKE 1 TABLET (5 MG TOTAL) BY MOUTH AT BEDTIME. FOR BLADDER INCONTINENCE. 90 tablet 2   pantoprazole (PROTONIX) 40 MG tablet Take 1 tablet (40 mg total) by mouth daily. 90 tablet 3   SUMAtriptan (IMITREX) 50 MG tablet Take 1 tablet by mouth at migraine onset. May repeat with another tablet 2 hours later if needed. 9 tablet 0   topiramate (TOPAMAX) 100 MG tablet TAKE 1 TABLET (100 MG TOTAL) BY MOUTH AT BEDTIME. FOR MIGRAINE PREVENTION. 90 tablet 2   traZODone (DESYREL) 50 MG tablet Take 3 tablets (150 mg total) by mouth at bedtime. For sleep. 270 tablet 2   No current facility-administered medications on file prior to visit.    BP 118/64    Pulse 89    Temp 98.6 F (37 C) (Temporal)     Ht 4\' 11"  (1.499 m)    Wt 119 lb (54 kg)    SpO2 96%    BMI 24.04 kg/m  Objective:   Physical Exam Cardiovascular:     Rate and Rhythm: Normal rate and regular rhythm.  Pulmonary:     Effort: Pulmonary effort is normal.     Breath sounds: Normal breath sounds.  Musculoskeletal:     Cervical back: Neck supple.  Skin:    General: Skin is warm and dry.          Assessment & Plan:      This visit occurred during the SARS-CoV-2 public health emergency.  Safety protocols were in place, including screening questions prior to the visit, additional usage of staff PPE, and extensive cleaning of exam room while observing appropriate contact time as indicated for disinfecting solutions.

## 2021-04-29 NOTE — Patient Instructions (Signed)
Continue taking duloxetine (Cymbalta) 60 mg once daily for foot pain and anxiety.  We have added duloxetine (Cymbalta) 30 mg.  Take both the 60 mg and 30 mg dose of the medication.  You can take these either together or separately.  It was a pleasure to see you today!

## 2021-05-04 NOTE — Progress Notes (Signed)
Neuro  Subjective: 56 year old female presenting today for follow up evaluation of bilateral chronic severe pain with idiopathic peripheral neuropathy.  MRIs were completed since last visit.  She presents for further treatment and evaluation   Past Medical History:  Diagnosis Date   Acquired pyloric stricture    Bleeding duodenal ulcer    Chronic esophagogastric ulcer    Depression    Duodenal obstruction    Gastric outlet obstruction 01/19/2018   GERD (gastroesophageal reflux disease)    Hair loss 09/22/2018   History of stomach ulcers    Hypothyroidism    Incisional hernia, without obstruction or gangrene    Intractable vomiting    Kidney stones    Migraine    Migraine headache    only1-2x/month since starting topamax   Neuropathy    Side pain 03/30/2018     Objective: Physical Exam General: The patient is alert and oriented x3 in no acute distress.  Dermatology: Skin is warm, dry and supple bilateral lower extremities. Negative for open lesions or macerations bilateral.   Vascular: Dorsalis Pedis and Posterior Tibial pulses palpable bilateral.  Capillary fill time is immediate to all digits.  Neurological: Epicritic and protective threshold intact bilateral.  Patient does experience burning with pins-and-needles to the bilateral feet daily  Musculoskeletal: There continues to be chronic severe tenderness to palpation to the plantar aspect of the bilateral heels along the plantar fascia as well as hypersensitivity throughout the entire foot. All other joints range of motion within normal limits bilateral. Strength 5/5 in all groups bilateral.   Assessment: 1. plantar fasciitis bilateral feet  2. Idiopathic peripheral neuropathy bilateral   Plan of Care:  1. Patient evaluated.  MRI reviewed today 2.  Unfortunately the patient continues to have pain and tenderness associated to the bilateral feet despite multiple conservative treatment modalities including steroid  injections, immobilization in a cam boot, anti-inflammatories, shoe gear modifications, and additional imaging modalities including MRI.  Patient continues to have severe pain and tenderness. 3.  I do suspect this is possibly a nerve etiology and possibly a small fiber neuropathy.  NCV WNL 4.  Order was placed today for small fiber nerve density kits to perform bilateral from [facility] labs. 5.  Return to clinic in 2 weeks to have the small fiber nerve density biopsies performed  Working 3 days/week at the [employer].  Edrick Kins, DPM [facility]  Dr. Edrick Kins, DPM    [address], [place] [address]                Office [phone]  Fax [phone]

## 2021-05-08 ENCOUNTER — Other Ambulatory Visit: Payer: Self-pay

## 2021-05-08 ENCOUNTER — Ambulatory Visit: Payer: Self-pay | Admitting: *Deleted

## 2021-05-08 ENCOUNTER — Ambulatory Visit: Payer: 59 | Admitting: Podiatry

## 2021-05-08 ENCOUNTER — Inpatient Hospital Stay
Admission: EM | Admit: 2021-05-08 | Discharge: 2021-05-11 | DRG: 378 | Disposition: A | Discharge status: Home / Self Care | Payer: 59 | Attending: Internal Medicine | Admitting: Internal Medicine

## 2021-05-08 ENCOUNTER — Encounter: Payer: Self-pay | Admitting: Emergency Medicine

## 2021-05-08 DIAGNOSIS — Z818 Family history of other mental and behavioral disorders: Secondary | ICD-10-CM | POA: Diagnosis not present

## 2021-05-08 DIAGNOSIS — Z90710 Acquired absence of both cervix and uterus: Secondary | ICD-10-CM | POA: Diagnosis not present

## 2021-05-08 DIAGNOSIS — Z880 Allergy status to penicillin: Secondary | ICD-10-CM | POA: Diagnosis not present

## 2021-05-08 DIAGNOSIS — Z79899 Other long term (current) drug therapy: Secondary | ICD-10-CM | POA: Diagnosis not present

## 2021-05-08 DIAGNOSIS — Z87442 Personal history of urinary calculi: Secondary | ICD-10-CM | POA: Diagnosis not present

## 2021-05-08 DIAGNOSIS — G43909 Migraine, unspecified, not intractable, without status migrainosus: Secondary | ICD-10-CM | POA: Diagnosis present

## 2021-05-08 DIAGNOSIS — Z791 Long term (current) use of non-steroidal anti-inflammatories (NSAID): Secondary | ICD-10-CM

## 2021-05-08 DIAGNOSIS — K219 Gastro-esophageal reflux disease without esophagitis: Secondary | ICD-10-CM | POA: Diagnosis present

## 2021-05-08 DIAGNOSIS — G629 Polyneuropathy, unspecified: Secondary | ICD-10-CM | POA: Diagnosis present

## 2021-05-08 DIAGNOSIS — Z980 Intestinal bypass and anastomosis status: Secondary | ICD-10-CM

## 2021-05-08 DIAGNOSIS — Z888 Allergy status to other drugs, medicaments and biological substances status: Secondary | ICD-10-CM

## 2021-05-08 DIAGNOSIS — F32A Depression, unspecified: Secondary | ICD-10-CM | POA: Diagnosis present

## 2021-05-08 DIAGNOSIS — K921 Melena: Secondary | ICD-10-CM

## 2021-05-08 DIAGNOSIS — D62 Acute posthemorrhagic anemia: Secondary | ICD-10-CM | POA: Diagnosis present

## 2021-05-08 DIAGNOSIS — Z79890 Hormone replacement therapy (postmenopausal): Secondary | ICD-10-CM | POA: Diagnosis not present

## 2021-05-08 DIAGNOSIS — Z882 Allergy status to sulfonamides status: Secondary | ICD-10-CM | POA: Diagnosis not present

## 2021-05-08 DIAGNOSIS — Z8711 Personal history of peptic ulcer disease: Secondary | ICD-10-CM | POA: Diagnosis not present

## 2021-05-08 DIAGNOSIS — I959 Hypotension, unspecified: Secondary | ICD-10-CM | POA: Diagnosis present

## 2021-05-08 DIAGNOSIS — G609 Hereditary and idiopathic neuropathy, unspecified: Secondary | ICD-10-CM

## 2021-05-08 DIAGNOSIS — E039 Hypothyroidism, unspecified: Secondary | ICD-10-CM | POA: Diagnosis present

## 2021-05-08 DIAGNOSIS — F419 Anxiety disorder, unspecified: Secondary | ICD-10-CM | POA: Diagnosis present

## 2021-05-08 DIAGNOSIS — F319 Bipolar disorder, unspecified: Secondary | ICD-10-CM | POA: Diagnosis present

## 2021-05-08 DIAGNOSIS — Z20822 Contact with and (suspected) exposure to covid-19: Secondary | ICD-10-CM | POA: Diagnosis present

## 2021-05-08 DIAGNOSIS — D492 Neoplasm of unspecified behavior of bone, soft tissue, and skin: Secondary | ICD-10-CM | POA: Diagnosis not present

## 2021-05-08 DIAGNOSIS — K922 Gastrointestinal hemorrhage, unspecified: Secondary | ICD-10-CM

## 2021-05-08 HISTORY — DX: Melena: K92.1

## 2021-05-08 LAB — RESP PANEL BY RT-PCR (FLU A&B, COVID) ARPGX2
Influenza A by PCR: NEGATIVE
Influenza B by PCR: NEGATIVE
SARS Coronavirus 2 by RT PCR: NEGATIVE

## 2021-05-08 LAB — CBC
HCT: 38 % (ref 36.0–46.0)
Hemoglobin: 12.3 g/dL (ref 12.0–15.0)
MCH: 31.1 pg (ref 26.0–34.0)
MCHC: 32.4 g/dL (ref 30.0–36.0)
MCV: 96 fL (ref 80.0–100.0)
Platelets: 322 10*3/uL (ref 150–400)
RBC: 3.96 MIL/uL (ref 3.87–5.11)
RDW: 12.8 % (ref 11.5–15.5)
WBC: 8.3 10*3/uL (ref 4.0–10.5)
nRBC: 0.2 % (ref 0.0–0.2)

## 2021-05-08 LAB — COMPREHENSIVE METABOLIC PANEL
ALT: 12 U/L (ref 0–44)
AST: 21 U/L (ref 15–41)
Albumin: 3.4 g/dL — ABNORMAL LOW (ref 3.5–5.0)
Alkaline Phosphatase: 44 U/L (ref 38–126)
Anion gap: 7 (ref 5–15)
BUN: 33 mg/dL — ABNORMAL HIGH (ref 6–20)
CO2: 23 mmol/L (ref 22–32)
Calcium: 8.5 mg/dL — ABNORMAL LOW (ref 8.9–10.3)
Chloride: 109 mmol/L (ref 98–111)
Creatinine, Ser: 0.75 mg/dL (ref 0.44–1.00)
GFR, Estimated: >60 mL/min (ref >60)
Glucose, Bld: 112 mg/dL — ABNORMAL HIGH (ref 70–99)
Potassium: 3.9 mmol/L (ref 3.5–5.1)
Sodium: 139 mmol/L (ref 135–145)
Total Bilirubin: 0.7 mg/dL (ref 0.3–1.2)
Total Protein: 6.1 g/dL — ABNORMAL LOW (ref 6.5–8.1)

## 2021-05-08 MED ORDER — METOCLOPRAMIDE HCL 5 MG/ML IJ SOLN
10.0000 mg | INTRAMUSCULAR | Status: AC
  Administered 2021-05-08: 10 mg via INTRAVENOUS
  Filled 2021-05-08: qty 2

## 2021-05-08 MED ORDER — ACETAMINOPHEN 650 MG RE SUPP: 650.0000 mg | Freq: Four times a day (QID) | RECTAL | Status: DC | PRN

## 2021-05-08 MED ORDER — ONDANSETRON HCL 4 MG/2ML IJ SOLN: 4.0000 mg | Freq: Four times a day (QID) | INTRAMUSCULAR | Status: DC | PRN

## 2021-05-08 MED ORDER — ONDANSETRON HCL 4 MG PO TABS: 4.0000 mg | ORAL_TABLET | Freq: Four times a day (QID) | ORAL | Status: DC | PRN

## 2021-05-08 MED ORDER — DEXTROSE-NACL 5-0.9 % IV SOLN: INTRAVENOUS | Status: DC

## 2021-05-08 MED ORDER — DIPHENHYDRAMINE HCL 50 MG/ML IJ SOLN
25.0000 mg | Freq: Once | INTRAMUSCULAR | Status: AC
Start: 2021-05-08 — End: 2021-05-08
  Administered 2021-05-08: 25 mg via INTRAVENOUS
  Filled 2021-05-08: qty 1

## 2021-05-08 MED ORDER — ACETAMINOPHEN 325 MG PO TABS
650.0000 mg | ORAL_TABLET | Freq: Four times a day (QID) | ORAL | Status: DC | PRN
  Administered 2021-05-09: 650 mg via ORAL
  Filled 2021-05-08: qty 2

## 2021-05-08 MED ORDER — PANTOPRAZOLE SODIUM 40 MG IV SOLR
40.0000 mg | Freq: Once | INTRAVENOUS | Status: DC
  Filled 2021-05-08: qty 10

## 2021-05-08 MED ORDER — PANTOPRAZOLE INFUSION (NEW) - SIMPLE MED
8.0000 mg/h | INTRAVENOUS | Status: DC
  Administered 2021-05-08 – 2021-05-11 (×7): 8 mg/h via INTRAVENOUS
  Filled 2021-05-08 (×9): qty 100

## 2021-05-08 NOTE — ED Provider Notes (Signed)
? ?[facility] ?Provider Note ? ? ? Event Date/Time  ? First MD Initiated Contact with Patient 05/08/21 2034   ?  (approximate) ? ? ?History  ? ?GI Bleeding ? ? ?HPI ? ?Tanya Nguyen is a 56 y.o. female with a history of migraines hypothyroidism depression and bipolar disorder who comes ED complaining of black diarrhea for the past 24 hours and 1 episode of vomiting today.  She reports difficulty tolerating oral intake today.  She also feels profoundly fatigued and lightheaded with standing. ? ?She reports that she has been taking ibuprofen multiple times a day, every day for the past 2 weeks after recent dental procedure.  Does not take any blood thinners.  Denies any history of peptic ulcer disease or GI bleeding. ?  ? ? ?Physical Exam  ? ?Triage Vital Signs: ?ED Triage Vitals  ?Enc Vitals Group  ?   BP 05/08/21 1314 119/75  ?   Pulse Rate 05/08/21 1314 67  ?   Resp 05/08/21 1314 17  ?   Temp 05/08/21 1314 98.2 ?F (36.8 ?C)  ?   Temp Source 05/08/21 1314 Oral  ?   SpO2 05/08/21 1314 100 %  ?   Weight 05/08/21 1315 113 lb (51.3 kg)  ?   Height 05/08/21 1315 4\' 11"  (1.499 m)  ?   Head Circumference --   ?   Peak Flow --   ?   Pain Score 05/08/21 1315 6  ?   Pain Loc --   ?   Pain Edu? --   ?   Excl. in [place]? --   ? ? ?Most recent vital signs: ?Vitals:  ? 05/08/21 1830 05/08/21 2000  ?BP: 124/61 121/64  ?Pulse: 71 66  ?Resp: 18 17  ?Temp:    ?SpO2: 100% 99%  ? ? ? ?General: Awake, no distress.  ?CV:  Good peripheral perfusion.  Regular rate and rhythm ?Resp:  Normal effort.  Clear to auscultation bilaterally ?Abd:  No distention.  Soft and nontender.  Rectal exam performed with nurse at bedside.  Black melanotic stool, strongly Hemoccult positive ?Other:  No rash, no lower extremity edema. ? ? ?ED Results / Procedures / Treatments  ? ?Labs ?(all labs ordered are listed, but only abnormal results are displayed) ?Labs Reviewed  ?COMPREHENSIVE METABOLIC PANEL - Abnormal; Notable for the following  components:  ?    Result Value  ? Glucose, Bld 112 (*)   ? BUN 33 (*)   ? Calcium 8.5 (*)   ? Total Protein 6.1 (*)   ? Albumin 3.4 (*)   ? All other components within normal limits  ?RESP PANEL BY RT-PCR (FLU A&B, COVID) ARPGX2  ?CBC  ?HEMOGLOBIN AND HEMATOCRIT, BLOOD  ?HEMOGLOBIN AND HEMATOCRIT, BLOOD  ?HEMOGLOBIN AND HEMATOCRIT, BLOOD  ?HIV ANTIBODY (ROUTINE TESTING W REFLEX)  ?POC OCCULT BLOOD, ED  ?TYPE AND SCREEN  ? ? ? ?EKG ? ?Interpreted by me ?Normal sinus rhythm rate of 68.  Normal axis and intervals.  Normal QRS ST segments and T waves. ? ? ?RADIOLOGY ? ? ? ? ?PROCEDURES: ? ?Critical Care performed: No ? ?Procedures ? ? ?MEDICATIONS ORDERED IN ED: ?Medications  ?pantoprazole (PROTONIX) injection 40 mg (40 mg Intravenous Not Given 05/08/21 2225)  ?pantoprozole (PROTONIX) 80 mg /NS 100 mL infusion (8 mg/hr Intravenous New Bag/Given 05/08/21 2258)  ?acetaminophen (TYLENOL) tablet 650 mg (has no administration in time range)  ?  Or  ?acetaminophen (TYLENOL) suppository 650 mg (has no administration in  time range)  ?ondansetron (ZOFRAN) tablet 4 mg (has no administration in time range)  ?  Or  ?ondansetron (ZOFRAN) injection 4 mg (has no administration in time range)  ?dextrose 5 %-0.9 % sodium chloride infusion ( Intravenous New Bag/Given 05/08/21 2224)  ?metoCLOPramide (REGLAN) injection 10 mg (10 mg Intravenous Given 05/08/21 2020)  ?diphenhydrAMINE (BENADRYL) injection 25 mg (25 mg Intravenous Given 05/08/21 2021)  ? ? ? ?IMPRESSION / MDM / ASSESSMENT AND PLAN / ED COURSE  ?I reviewed the triage vital signs and the nursing notes. ?             ?               ? ? ? ?**The patient is on the cardiac monitor to evaluate for evidence of arrhythmia and/or significant heart rate changes.**} ? ?Patient presents with fatigue and lightheadedness, found to have melanotic stool consistent with upper GI bleed related to NSAID use.  Hemoglobin is 12, blood pressure is stable, no signs of shock.  Will defer on transfusions.  Case  discussed with hospitalist for further evaluation.  IV Protonix ordered. ? ?  ? ? ?FINAL CLINICAL IMPRESSION(S) / ED DIAGNOSES  ? ?Final diagnoses:  ?Acute upper GI bleed  ? ? ? ?Rx / DC Orders  ? ?ED Discharge Orders   ? ? None  ? ?  ? ? ? ?Note:  This document was prepared using Dragon voice recognition software and may include unintentional dictation errors. ?  ?Carrie Mew, MD ?05/08/21 2332 ? ?

## 2021-05-08 NOTE — ED Notes (Signed)
RN to bedside to answer call bell. Pt complaint of headache. Pt also appears very anxious and asking "to make sure im hooked up" and pointed to the monitor. RN informed she is hooked up to the monitor and is fine and the nurse will be made aware about her headache ?

## 2021-05-08 NOTE — ED Notes (Signed)
Nt to bedside to answer pt call bell. Pt is complaining of headache and wants pain medication and water. ?

## 2021-05-08 NOTE — ED Notes (Signed)
Dr. Joni Fears at [facility]. Chaperone RN present. POC occult blood grossly +. VSS.  ?

## 2021-05-08 NOTE — Assessment & Plan Note (Addendum)
Controlled with topiramate.  Imitrex for breakthrough migraine ?

## 2021-05-08 NOTE — H&P (Signed)
History and Physical    Patient: Tanya Nguyen UYQ:[id number] DOB: 07-Nov-1965 DOA: 05/08/2021 DOS: the patient was seen and examined on 05/08/2021 PCP: Pleas Koch, NP  Patient coming from: Home  Chief Complaint:  Chief Complaint  Patient presents with   GI Bleeding    HPI: Tanya Nguyen is a 56 y.o. female with medical history significant of Gastrojejunostomy in 01/2018 secondary to chronic esophagogastric ulcer with pyloric stenosis, history of significant prior and current NSAID use, who presents to the ED with a 1 day history of melanotic stools 6-7 episodes and then on the day of arrival 1 episode of dark emesis.  She denied abdominal pain.  Admits to increased NSAID use over the past 2 weeks for dental pain.  She has associated weakness but denies chest pain, shortness of breath, palpitations, lightheadedness or headache. ED course: Vitals, unremarkable.  Blood work with normal hemoglobin of 12.3 and for the most part unremarkable.  EKG, personally viewed and interpreted, sinus rhythm at 68 with no acute ST-T wave changes.  No imaging studies performed.  Patient treated with an IV Protonix bolus, Reglan and Benadryl.  Hospitalist consulted for admission.   Review of Systems: As mentioned in the history of present illness. All other systems reviewed and are negative. Past Medical History:  Diagnosis Date   Acquired pyloric stricture    Bleeding duodenal ulcer    Chronic esophagogastric ulcer    Depression    Duodenal obstruction    Gastric outlet obstruction 01/19/2018   GERD (gastroesophageal reflux disease)    Hair loss 09/22/2018   History of stomach ulcers    Hypothyroidism    Incisional hernia, without obstruction or gangrene    Intractable vomiting    Kidney stones    Migraine    Migraine headache    only1-2x/month since starting topamax   Neuropathy    Side pain 03/30/2018   Past Surgical History:  Procedure Laterality Date   ABDOMINAL HYSTERECTOMY  2004    partial   CHOLECYSTECTOMY     COLONOSCOPY WITH PROPOFOL N/A 02/27/2018   Procedure: COLONOSCOPY WITH PROPOFOL;  Surgeon: [name], MD;  Location: [facility];  Service: Endoscopy;  Laterality: N/A;   ESOPHAGOGASTRODUODENOSCOPY (EGD) WITH PROPOFOL N/A 01/12/2018   Procedure: ESOPHAGOGASTRODUODENOSCOPY (EGD) WITH BIOPSIES;  Surgeon: [name], MD;  Location: [place];  Service: Endoscopy;  Laterality: N/A;   ESOPHAGOGASTRODUODENOSCOPY (EGD) WITH PROPOFOL N/A 01/25/2018   Procedure: ESOPHAGOGASTRODUODENOSCOPY (EGD) WITH PROPOFOL;  Surgeon: Jonathon Bellows, MD;  [facility]: [facility] END[facility]COPY;  Service: Gastroenterology;  Laterality: N/A;   ESOPHAGOGASTRODUODENOSCOPY (EGD) WITH PROPOFOL N/A 02/27/2018   Procedure: ESOPHAGOGASTRODUODENOSCOPY (EGD) WITH PROPOFOL;  Surgeon: [name], MD;  Location: [place];  Service: Endoscopy;  Laterality: N/A;   INCISIONAL HERNIA REPAIR N/A 11/23/2018   Procedure: HERNIA REPAIR INCISIONAL;  Surgeon: Olean Ree, MD;  Location: [facility];  Service: General;  Laterality: N/A;   INSERTION OF MESH N/A 11/23/2018   Procedure: INSERTION OF MESH;  Surgeon: Olean Ree, MD;  Location: [facility];  Service: General;  Laterality: N/A;   LAPAROTOMY N/A 01/20/2018   Procedure: EXPLORATORY LAPAROTOMY;  Surgeon: Olean Ree, MD;  Location: [facility];  Service: General;  Laterality: N/A;   TOOTH EXTRACTION     Social History:  reports that she has never smoked. She has never used smokeless tobacco. She reports that she does not currently use alcohol. She reports that she does not use drugs.  Allergies  Allergen Reactions   Penicillins Shortness Of  Breath, Diarrhea and Nausea And Vomiting    Has patient had a PCN reaction causing immediate rash, facial/tongue/throat swelling, SOB or lightheadedness with hypotension: yes Has patient had a PCN reaction causing severe rash involving mucus membranes or skin necrosis: no Has patient had a PCN reaction  that required hospitalization no Has patient had a PCN reaction occurring within the last 10 years: about 10 years If all of the above answers are "NO", then may proceed with Cephalosporin use.    Gabapentin Other (See Comments)    Hair Loss    Pregabalin     Tremors   Sulfa Antibiotics Nausea And Vomiting    Family History  Problem Relation Age of Onset   Cancer Mother    Breast cancer Mother 3   Lung cancer Father    Alcohol abuse Sister    Bipolar disorder Sister    Alcohol abuse Maternal Grandmother    Stroke Maternal Grandmother    Stroke Paternal Grandmother     Prior to Admission medications   Medication Sig Start Date End Date Taking? Authorizing Provider  cetirizine (ZYRTEC) 10 MG tablet TAKE 1 TABLET BY MOUTH EVERY DAY AS NEEDED FOR ALLERGY 05/06/20   Pleas Koch, NP  cyclobenzaprine (FLEXERIL) 5 MG tablet Take 1 tablet (5 mg total) by mouth 2 (two) times daily as needed for muscle spasms (migraine (sedation precautions)). 04/02/21   Pleas Koch, NP  DULoxetine (CYMBALTA) 30 MG capsule Take 1 capsule (30 mg total) by mouth daily. For anxiety and pain. Take with 60 mg. 04/29/21   Pleas Koch, NP  DULoxetine (CYMBALTA) 60 MG capsule Take 1 capsule (60 mg total) by mouth daily. For anxiety and pain. 04/02/21   Pleas Koch, NP  fluticasone (FLONASE) 50 MCG/ACT nasal spray Place 1 spray into both nostrils 2 (two) times daily. 05/03/18   Pleas Koch, NP  levothyroxine (SYNTHROID) 88 MCG tablet TAKE 1 TABLET BY MOUTH EVERY MORNING ON EMPTY STOMACH WITH WATER ONLY-NO FOOD OR OTHER MED FOR 30MIN 02/26/21   Pleas Koch, NP  ondansetron (ZOFRAN-ODT) 4 MG disintegrating tablet Take 1 tablet (4 mg total) by mouth every 8 (eight) hours as needed for nausea. 07/16/20   Ria Bush, MD  oxybutynin (DITROPAN-XL) 5 MG 24 hr tablet TAKE 1 TABLET (5 MG TOTAL) BY MOUTH AT BEDTIME. FOR BLADDER INCONTINENCE. 12/14/20   Pleas Koch, NP  pantoprazole  (PROTONIX) 40 MG tablet Take 1 tablet (40 mg total) by mouth daily. 02/24/21   [name], MD  SUMAtriptan (IMITREX) 50 MG tablet Take 1 tablet by mouth at migraine onset. May repeat with another tablet 2 hours later if needed. 03/04/21   Pleas Koch, NP  topiramate (TOPAMAX) 100 MG tablet TAKE 1 TABLET (100 MG TOTAL) BY MOUTH AT BEDTIME. FOR MIGRAINE PREVENTION. 02/26/21   Pleas Koch, NP  traZODone (DESYREL) 50 MG tablet Take 3 tablets (150 mg total) by mouth at bedtime. For sleep. 03/04/21   Pleas Koch, NP    Physical Exam: Vitals:   05/08/21 1630 05/08/21 1800 05/08/21 1830 05/08/21 2000  BP: 130/63 113/67 124/61 121/64  Pulse: 87 68 71 66  Resp: (!) 22 12 18 17   Temp:      TempSrc:      SpO2: 100% 100% 100% 99%  Weight:      Height:       Physical Exam Vitals and nursing note reviewed.  Constitutional:      General: She  is not in acute distress.    Appearance: Normal appearance.     Comments: Frail-appearing  HENT:     Head: Normocephalic and atraumatic.  Cardiovascular:     Rate and Rhythm: Normal rate and regular rhythm.     Pulses: Normal pulses.     Heart sounds: Normal heart sounds. No murmur heard. Pulmonary:     Effort: Pulmonary effort is normal.     Breath sounds: Normal breath sounds. No wheezing or rhonchi.  Abdominal:     General: Bowel sounds are normal.     Palpations: Abdomen is soft.     Tenderness: There is no abdominal tenderness.  Musculoskeletal:        General: No swelling or tenderness. Normal range of motion.     Cervical back: Normal range of motion and neck supple.  Skin:    General: Skin is warm and dry.  Neurological:     General: No focal deficit present.     Mental Status: She is alert. Mental status is at baseline.  Psychiatric:        Mood and Affect: Mood normal.        Behavior: Behavior normal.     Data Reviewed: Relevant notes from primary care and specialist visits, past discharge summaries as  available in EHR, including Care Everywhere. Prior diagnostic testing as pertinent to current admission diagnoses Updated medications and problem lists for reconciliation ED course, including vitals, labs, imaging, treatment and response to treatment Triage notes, nursing and pharmacy notes and ED provider's notes Notable results as noted in HPI   Assessment and Plan: * Melena N.p.o. for likely procedure in the a.m. IV Protonix infusion GI consult Serial H&H and transfuse if needed  NSAID long-term use Counseled on avoiding NSAIDs  Hx of bypass gastrojejunostomy Had bypass in November 2019 due to prepyloric stenosis from also Follow-up endoscopy in 2019 was normal  Depression Continue duloxetine and trazodone  Hypothyroidism Continue levothyroxine  Migraine Controlled with topiramate.  Imitrex for breakthrough migraine       Advance Care Planning:   Code Status: Prior   Consults: GI, Dr. Virgina Jock  Family Communication: none  Severity of Illness: The appropriate patient status for this patient is INPATIENT. Inpatient status is judged to be reasonable and necessary in order to provide the required intensity of service to ensure the patient's safety. The patient's presenting symptoms, physical exam findings, and initial radiographic and laboratory data in the context of their chronic comorbidities is felt to place them at high risk for further clinical deterioration. Furthermore, it is not anticipated that the patient will be medically stable for discharge from the hospital within 2 midnights of admission.   * I certify that at the point of admission it is my clinical judgment that the patient will require inpatient hospital care spanning beyond 2 midnights from the point of admission due to high intensity of service, high risk for further deterioration and high frequency of surveillance required.*  Author: Athena Masse, MD 05/08/2021 8:53 PM  For on call review  [email or web address].

## 2021-05-08 NOTE — Assessment & Plan Note (Signed)
N.p.o. for likely procedure in the a.m. ?IV Protonix infusion ?GI consult ?Serial H&H and transfuse if needed ?

## 2021-05-08 NOTE — Progress Notes (Signed)
Neuro ? ?Subjective: ?56 year old female presenting today for follow up evaluation of bilateral chronic severe pain with idiopathic peripheral neuropathy.  As discussed last visit we are going to perform a small fiber nerve biopsy to the bilateral lower extremities today.  She presents for further treatment and evaluation ? ? ?Past Medical History:  ?Diagnosis Date  ? Acquired pyloric stricture   ? Bleeding duodenal ulcer   ? Chronic esophagogastric ulcer   ? Depression   ? Duodenal obstruction   ? Gastric outlet obstruction 01/19/2018  ? GERD (gastroesophageal reflux disease)   ? Hair loss 09/22/2018  ? History of stomach ulcers   ? Hypothyroidism   ? Incisional hernia, without obstruction or gangrene   ? Intractable vomiting   ? Kidney stones   ? Migraine   ? Migraine headache   ? only1-2x/month since starting topamax  ? Neuropathy   ? Side pain 03/30/2018  ? ? ? ?Objective: ?Physical Exam ?General: The patient is alert and oriented x3 in no acute distress. ? ?Dermatology: Skin is warm, dry and supple bilateral lower extremities. Negative for open lesions or macerations bilateral.  ? ?Vascular: Dorsalis Pedis and Posterior Tibial pulses palpable bilateral.  Capillary fill time is immediate to all digits. ? ?Neurological: Epicritic and protective threshold intact bilateral.  Patient does experience burning with pins-and-needles to the bilateral feet daily ? ?Musculoskeletal: There continues to be chronic severe tenderness to palpation to the plantar aspect of the bilateral heels along the plantar fascia as well as hypersensitivity throughout the entire foot. All other joints range of motion within normal limits bilateral. Strength 5/5 in all groups bilateral.  ? ?Assessment: ?1. plantar fasciitis bilateral feet  ?2. Idiopathic peripheral neuropathy bilateral  ? ?Plan of Care:  ?1. Patient evaluated.   ?2.  After discussing small fiber nerve biopsy last visit we are going to proceed with a small nerve biopsy to the  bilateral lower extremities.  The areas were prepped in an aseptic manner and local anesthesia infiltration was utilized with 0.5% Marcaine with epi totaling 3 mL bilateral legs ?3.  Small fiber nerve biopsy was harvested using a 3 mm punch approximately 10 cm proximal to the lateral malleolus of the ankle bilateral lower extremities.  Each specimen was placed in the appropriate container and sent to pathology, [facility].  ?4.  Light dressing and post care instructions were provided ?5.  Return to clinic as needed.  Office will contact the patient with results ? ?Working 3 days/week at [employer]. ? ?Edrick Kins, DPM ?[place] ? ?Dr. Edrick Kins, DPM  ?  ?[address]                                   ?[place], [place] [address]                ?Office [phone]  ?Fax [phone] ? ? ? ? ?

## 2021-05-08 NOTE — Telephone Encounter (Signed)
Reason for Disposition ? Black or tarry bowel movements (Exception: chronic-unchanged black-grey bowel movements AND is taking iron pills or Pepto-bismol) ? ?Answer Assessment - Initial Assessment Questions ?1. APPEARANCE of BLOOD: "What color is it?" "Is it passed separately, on the surface of the stool, or mixed in with the stool?"  ?    I'm having black diarrhea for 2 days.   I've been on ibuprofen for a a pulled tooth.   I can't go there to the ED.   I'm having diarrhea so bad I can't go sit at the emergency room.   I'm having a terrible headache.   I'm vomiting even water.   I'm dehydrated. ?2. AMOUNT: "How much blood was passed?"  ?    For 2 days I'm having black diarrhea about every 15 minutes. ?3. FREQUENCY: "How many times has blood been passed with the stools?"  ?    Every time about every 15 minutes for the last 2 days. ?4. ONSET: "When was the blood first seen in the stools?" (Days or weeks)  ?    *No Answer* ?5. DIARRHEA: "Is there also some diarrhea?" If Yes, ask: "How many diarrhea stools in the past 24 hours?"  ?    Yes bad diarrhea   I can't stay off the toilet which is why I can't go sit at the ED. ?6. CONSTIPATION: "Do you have constipation?" If Yes, ask: "How bad is it?" ?    No ?7. RECURRENT SYMPTOMS: "Have you had blood in your stools before?" If Yes, ask: "When was the last time?" and "What happened that time?"  ?    *No Answer* ?8. BLOOD THINNERS: "Do you take any blood thinners?" (e.g., Coumadin/warfarin, Pradaxa/dabigatran, aspirin) ?    No but taking ibuprofen because she had a tooth pulled. ?9. OTHER SYMPTOMS: "Do you have any other symptoms?"  (e.g., abdomen pain, vomiting, dizziness, fever) ?    I'm so weak ?10. PREGNANCY: "Is there any chance you are pregnant?" "When was your last menstrual period?" ?      N/A ? ?Protocols used: Rectal Bleeding-A-AH ? ?

## 2021-05-08 NOTE — Assessment & Plan Note (Signed)
Had bypass in November 2019 due to prepyloric stenosis from also ?Follow-up endoscopy in 2019 was normal ?

## 2021-05-08 NOTE — Addendum Note (Signed)
Addended by: Graceann Congress D on: 05/08/2021 02:13 PM ? ? Modules accepted: Orders ? ?

## 2021-05-08 NOTE — ED Triage Notes (Signed)
Pt in via POV, reports new onset of black diarrhea, also reports vomiting blood.  States she is unable to keep anything down.  States this all began yesterday, also reports generalized weakness.  Concerned that this may be due to taking high dose ibuprofen daily x approximately 2 weeks due to recent dental work.  Vitals WDL, NAD noted at this time.   ?

## 2021-05-08 NOTE — Assessment & Plan Note (Signed)
Continue duloxetine and trazodone 

## 2021-05-08 NOTE — Assessment & Plan Note (Signed)
Continue levothyroxine 

## 2021-05-08 NOTE — ED Notes (Signed)
Patient states she is still seeing blood in her stool. Patient seems very anxious at this time. RN reassured of vital signs and lab values. Will continue to monitor. ?

## 2021-05-08 NOTE — Telephone Encounter (Signed)
?  Chief Complaint: black diarrhea for the last 2 days.  Feeling very weak.  Vomiting.  Unable to keep even water down. ?Symptoms: Having black diarrhea about every 15 minutes for the last 2 days.   Feeling dehydrated and weak. ?Frequency: Every 15 min for 2 days now ?Pertinent Negatives: Patient denies N/A ?Disposition: [x] ED /[] Urgent Care (no appt availability in office) / [] Appointment(In office/virtual)/ []  [facility]/ [] Home Care/ [] Refused Recommended Disposition /[] [facility]/ []  Follow-up with PCP ?Additional Notes: Pt has not called her PCP.   "I know she'll tell me to go to the emergency room".    I advised pt that's where she needed to go was the ED.   She finally agreed to go.   She has someone who can take her.    ?

## 2021-05-08 NOTE — Assessment & Plan Note (Signed)
Counseled on avoiding NSAIDs ?

## 2021-05-09 ENCOUNTER — Encounter: Payer: Self-pay | Admitting: Internal Medicine

## 2021-05-09 ENCOUNTER — Inpatient Hospital Stay: Payer: 59 | Admitting: Registered Nurse

## 2021-05-09 ENCOUNTER — Encounter
Admission: EM | Disposition: A | Discharge status: Home / Self Care | Payer: Self-pay | Source: Home / Self Care | Attending: Internal Medicine

## 2021-05-09 HISTORY — PX: ESOPHAGOGASTRODUODENOSCOPY (EGD) WITH PROPOFOL: SHX5813

## 2021-05-09 LAB — HEMOGLOBIN AND HEMATOCRIT, BLOOD
HCT: 29.9 % — ABNORMAL LOW (ref 36.0–46.0)
HCT: 29.7 % — ABNORMAL LOW (ref 36.0–46.0)
Hemoglobin: 9.8 g/dL — ABNORMAL LOW (ref 12.0–15.0)
Hemoglobin: 9.8 g/dL — ABNORMAL LOW (ref 12.0–15.0)

## 2021-05-09 SURGERY — ESOPHAGOGASTRODUODENOSCOPY (EGD) WITH PROPOFOL
Anesthesia: General

## 2021-05-09 MED ORDER — TRAZODONE HCL 50 MG PO TABS
150.0000 mg | ORAL_TABLET | Freq: Every day | ORAL | Status: DC
  Administered 2021-05-09 – 2021-05-10 (×3): 150 mg via ORAL
  Filled 2021-05-09 (×3): qty 1

## 2021-05-09 MED ORDER — SUMATRIPTAN SUCCINATE 50 MG PO TABS
50.0000 mg | ORAL_TABLET | Freq: Once | ORAL | Status: AC
  Administered 2021-05-10: 50 mg via ORAL
  Filled 2021-05-09: qty 1

## 2021-05-09 MED ORDER — PROPOFOL 500 MG/50ML IV EMUL
INTRAVENOUS | Status: DC | PRN
Start: 2021-05-09 — End: 2021-05-09
  Administered 2021-05-09: 140 ug/kg/min via INTRAVENOUS

## 2021-05-09 MED ORDER — TOPIRAMATE 100 MG PO TABS
100.0000 mg | ORAL_TABLET | Freq: Every day | ORAL | Status: DC
  Administered 2021-05-09 – 2021-05-10 (×3): 100 mg via ORAL
  Filled 2021-05-09: qty 1
  Filled 2021-05-09: qty 4
  Filled 2021-05-09 (×2): qty 1

## 2021-05-09 MED ORDER — TOPIRAMATE 25 MG PO TABS: 100.0000 mg | ORAL_TABLET | Freq: Every day | ORAL | Status: DC

## 2021-05-09 MED ORDER — SODIUM CHLORIDE 0.9 % IV SOLN: INTRAVENOUS | Status: DC

## 2021-05-09 MED ORDER — TRAZODONE HCL 50 MG PO TABS: 150.0000 mg | ORAL_TABLET | Freq: Every day | ORAL | Status: DC

## 2021-05-09 MED ORDER — TRAMADOL HCL 50 MG PO TABS
50.0000 mg | ORAL_TABLET | Freq: Four times a day (QID) | ORAL | Status: DC | PRN
  Administered 2021-05-09: 50 mg via ORAL
  Filled 2021-05-09: qty 1

## 2021-05-09 MED ORDER — PROPOFOL 10 MG/ML IV BOLUS
INTRAVENOUS | Status: DC | PRN
Start: 2021-05-09 — End: 2021-05-09
  Administered 2021-05-09: 70 mg via INTRAVENOUS
  Administered 2021-05-09 (×2): 30 mg via INTRAVENOUS

## 2021-05-09 MED ORDER — SIMETHICONE 40 MG/0.6ML PO SUSP
ORAL | Status: DC | PRN
  Administered 2021-05-09: 120 mL

## 2021-05-09 MED ORDER — CYCLOBENZAPRINE HCL 10 MG PO TABS: 5.0000 mg | ORAL_TABLET | Freq: Two times a day (BID) | ORAL | Status: DC | PRN

## 2021-05-09 MED ORDER — DULOXETINE HCL 30 MG PO CPEP
60.0000 mg | ORAL_CAPSULE | Freq: Every day | ORAL | Status: DC
  Administered 2021-05-10 – 2021-05-11 (×2): 60 mg via ORAL
  Filled 2021-05-09 (×2): qty 2

## 2021-05-09 NOTE — Care Plan (Signed)
Per pt request- called patient's husband Truman Hayward by phone after confirming patient's DOB. Updated on endoscopic exam findings and GI plan of care. ? ?An opportunity for questions was provided and appropriate answers given. He verbalized understanding and was appreciative of the phone call ? ?Annamaria Helling, DO ?[facility] ?

## 2021-05-09 NOTE — Interval H&P Note (Signed)
History and Physical Interval Note: Consult note from 05/09/21 ? was reviewed and there was no interval change after seeing and examining the patient.  Written consent was obtained from the patient after discussion of risks, benefits, and alternatives. Patient has consented to proceed with Esophagogastroduodenoscopy with possible intervention ? ? ?05/09/2021 ?10:10 AM ? ?Tanya Nguyen  has presented today for surgery, with the diagnosis of melena, abla.  The various methods of treatment have been discussed with the patient and family. After consideration of risks, benefits and other options for treatment, the patient has consented to  Procedure(s): ?ESOPHAGOGASTRODUODENOSCOPY (EGD) WITH PROPOFOL (N/A) as a surgical intervention.  The patient's history has been reviewed, patient examined, no change in status, stable for surgery.  I have reviewed the patient's chart and labs.  Questions were answered to the patient's satisfaction.   ? ? ?Annamaria Helling ? ? ?

## 2021-05-09 NOTE — Anesthesia Preprocedure Evaluation (Signed)
Anesthesia Evaluation  ?Patient identified by MRN, date of birth, ID band ?Patient awake ? ? ? ?Reviewed: ?Allergy & Precautions, H&P , NPO status , reviewed documented beta blocker date and time  ? ?History of Anesthesia Complications ?Negative for: history of anesthetic complications ? ?Airway ?Mallampati: III ? ? ?Neck ROM: full ? ? ? Dental ? ?(+) Chipped, Dental Advidsory Given ?  ?Pulmonary ?neg pulmonary ROS,  ?  ?Pulmonary exam normal ? ? ? ? ? ? ? Cardiovascular ?Exercise Tolerance: Good ?negative cardio ROS ?Normal cardiovascular exam ? ? ?  ?Neuro/Psych ? Headaches, neg Seizures PSYCHIATRIC DISORDERS Anxiety Depression Bipolar Disorder  Neuromuscular disease   ? GI/Hepatic ?Neg liver ROS, PUD, GERD  Medicated and Controlled,  ?Endo/Other  ?neg diabetesHypothyroidism  ? Renal/GU ?Renal disease (kidney stones)  ? ?  ?Musculoskeletal ? ? Abdominal ?  ?Peds ? Hematology ? ?(+) Blood dyscrasia, anemia ,   ?Anesthesia Other Findings ?Past Medical History: ?No date: Acquired pyloric stricture ?No date: Bleeding duodenal ulcer ?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 ?No date: Depression ?No date: Duodenal obstruction ?01/19/2018: Gastric outlet obstruction ?No date: GERD (gastroesophageal reflux disease) ?No date: History of stomach ulcers ?No date: Hypothyroidism ?No date: Intractable vomiting ?No date: Kidney stones ?No date: Migraine ?No date: Migraine headache ?    Comment:  only1-2x/month since starting topamax ?Past Surgical History: ?2004: ABDOMINAL HYSTERECTOMY ?    Comment:  partial ?No date: CHOLECYSTECTOMY ?02/27/2018: COLONOSCOPY WITH PROPOFOL; N/A ?    Comment:  Procedure: COLONOSCOPY WITH PROPOFOL;  Surgeon: Allen Norris,  ?             Darren, MD;  Location: [facility];  Service:  ?             Endoscopy;  Laterality: N/A; ?01/12/2018: ESOPHAGOGASTRODUODENOSCOPY (EGD) WITH PROPOFOL; N/A ?    Comment:  Procedure: ESOPHAGOGASTRODUODENOSCOPY (EGD) WITH  ?              BIOPSIES;  Surgeon: [name], MD;  Location: [facility]  ?             [facility];  Service: Endoscopy;  Laterality: N/A; ?01/25/2018: ESOPHAGOGASTRODUODENOSCOPY (EGD) WITH PROPOFOL; N/A ?    Comment:  Procedure: ESOPHAGOGASTRODUODENOSCOPY (EGD) WITH  ?             PROPOFOL;  Surgeon: Jonathon Bellows, MD;  Location: [facility]  ?             ENDOSCOPY;  Service: Gastroenterology;  Laterality: N/A; ?02/27/2018: ESOPHAGOGASTRODUODENOSCOPY (EGD) WITH PROPOFOL; N/A ?    Comment:  Procedure: ESOPHAGOGASTRODUODENOSCOPY (EGD) WITH  ?             PROPOFOL;  Surgeon: [name], MD;  Location: [facility]  ?             [facility];  Service: Endoscopy;  Laterality: N/A; ?01/20/2018: LAPAROTOMY; N/A ?    Comment:  Procedure: EXPLORATORY LAPAROTOMY;  Surgeon: Hampton Abbot,  ?             Jacqulyn Bath, MD;  Location: [facility];  Service: General;   ?             Laterality: N/A; ?No date: TOOTH EXTRACTION ? ? Reproductive/Obstetrics ? ?  ? ? ? ? ? ? ? ? ? ? ? ? ? ?  ?  ? ? ? ? ? ? ? ? ?Anesthesia Physical ? ?Anesthesia Plan ? ?ASA: 3 ? ?Anesthesia Plan: General  ? ?Post-op Pain Management:   ? ?Induction: Intravenous ? ?  PONV Risk Score and Plan: Treatment may vary due to age or medical condition, Propofol infusion and TIVA ? ?Airway Management Planned:  ? ?Additional Equipment:  ? ?Intra-op Plan:  ? ?Post-operative Plan:  ? ?Informed Consent: I have reviewed the patients History and Physical, chart, labs and discussed the procedure including the risks, benefits and alternatives for the proposed anesthesia with the patient or authorized representative who has indicated his/her understanding and acceptance.  ? ? ? ?Dental Advisory Given ? ?Plan Discussed with: CRNA ? ?Anesthesia Plan Comments:   ? ? ? ? ? ? ?Anesthesia Quick Evaluation ? ?

## 2021-05-09 NOTE — H&P (View-Only) (Signed)
Inpatient Consultation   Patient ID: Tanya Nguyen is a 56 y.o. female.  Requesting Provider: Dr. 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  Pt has had ongoing tooth pain for several months and has been taking ibuprofen '600mg'$  every 6 hours. She does take a once daily ppi for reflux. Yesterday she noted black bowel movements and had one episode of dark emesis. No hematochezia or hematemesis. She notes headache and light headedness but no sob or chest pain. Her appetite has been diminished over the last 3 days.  No current abdominal pain. 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 Ibuprofen '600mg'$  q6h for multiple months Denies Anti-plt agents, and anticoagulants Denies family history of gastrointestinal disease and malignancy Previous Endoscopies: 02/2018- EGD/Colonoscopy- Gastrojejunostomy, gastric stenosis at the pylorus, normal examined jejunum - Fair prep, liquid stool throughout the colon   Past Medical History:  Diagnosis Date   Acquired pyloric stricture    Bleeding duodenal ulcer    Chronic esophagogastric ulcer    Depression    Duodenal obstruction    Gastric outlet obstruction 01/19/2018   GERD (gastroesophageal reflux disease)    Hair loss 09/22/2018   History of stomach ulcers    Hypothyroidism    Incisional hernia, without obstruction or gangrene    Intractable vomiting    Kidney stones    Migraine    Migraine headache    only1-2x/month since starting topamax   Neuropathy    Side pain 03/30/2018    Past Surgical History:  Procedure Laterality Date   ABDOMINAL  HYSTERECTOMY  2004   partial   CHOLECYSTECTOMY     COLONOSCOPY WITH PROPOFOL N/A 02/27/2018   Procedure: COLONOSCOPY WITH PROPOFOL;  Surgeon: [name], MD;  Location: [place];  Service: Endoscopy;  Laterality: N/A;   ESOPHAGOGASTRODUODENOSCOPY (EGD) WITH PROPOFOL N/A 01/12/2018   Procedure: ESOPHAGOGASTRODUODENOSCOPY (EGD) WITH BIOPSIES;  Surgeon: [name], MD;  Location: [place];  Service: Endoscopy;  Laterality: N/A;   ESOPHAGOGASTRODUODENOSCOPY (EGD) WITH PROPOFOL N/A 01/25/2018   Procedure: ESOPHAGOGASTRODUODENOSCOPY (EGD) WITH PROPOFOL;  Surgeon: Jonathon Bellows, MD;  Location: [facility];  Service: Gastroenterology;  Laterality: N/A;   ESOPHAGOGASTRODUODENOSCOPY (EGD) WITH PROPOFOL N/A 02/27/2018   Procedure: ESOPHAGOGASTRODUODENOSCOPY (EGD) WITH PROPOFOL;  Surgeon: [name], MD;  Location: [place];  Service: Endoscopy;  Laterality: N/A;   INCISIONAL HERNIA REPAIR N/A 11/23/2018   Procedure: HERNIA REPAIR INCISIONAL;  Surgeon: Olean Ree, MD;  Location: [facility];  Service: General;  Laterality: N/A;   INSERTION OF MESH N/A 11/23/2018   Procedure: INSERTION OF MESH;  Surgeon: Olean Ree, MD;  Location: [place] [facility];  Service: General;  Laterality: N/A;   LAPAROTOMY N/A 01/20/2018   Procedure: EXPLORATORY LAPAROTOMY;  Surgeon: Olean Ree, MD;  Location: [facility];  Service: General;  Laterality: N/A;   TOOTH EXTRACTION      Allergies  Allergen Reactions   Penicillins Shortness Of Breath, Diarrhea and Nausea And Vomiting    Has patient had a PCN reaction causing immediate rash, facial/tongue/throat swelling, SOB or lightheadedness with hypotension: yes Has patient had a PCN reaction causing severe rash involving mucus membranes or skin necrosis:  no Has patient had a PCN reaction that required hospitalization no Has patient had a PCN reaction occurring within the last 10 years: about 10 years If all of the above answers are "NO", then may  proceed with Cephalosporin use.    Gabapentin Other (See Comments)    Hair Loss    Pregabalin     Tremors   Sulfa Antibiotics Nausea And Vomiting    Family History  Problem Relation Age of Onset   Cancer Mother    Breast cancer Mother 71   Lung cancer Father    Alcohol abuse Sister    Bipolar disorder Sister    Alcohol abuse Maternal Grandmother    Stroke Maternal Grandmother    Stroke Paternal Grandmother     Social History   Tobacco Use   Smoking status: Never   Smokeless tobacco: Never  Vaping Use   Vaping Use: Never used  Substance Use Topics   Alcohol use: Not Currently    Alcohol/week: 0.0 standard drinks   Drug use: No     Pertinent GI related history and allergies were reviewed with the patient  Review of Systems  Constitutional:  Positive for appetite change (diminished). Negative for activity change, chills, diaphoresis, fatigue, fever and unexpected weight change.  HENT:  Negative for trouble swallowing and voice change.   Respiratory:  Negative for shortness of breath and wheezing.   Cardiovascular:  Negative for chest pain, palpitations and leg swelling.  Gastrointestinal:  Positive for abdominal pain (mild), blood in stool (melena), diarrhea, nausea and vomiting (x1). Negative for abdominal distention, anal bleeding, constipation and rectal pain.  Musculoskeletal:  Negative for arthralgias and myalgias.  Skin:  Negative for color change and pallor.  Neurological:  Positive for dizziness, weakness, light-headedness and headaches. Negative for syncope.  Psychiatric/Behavioral:  Negative for confusion.   All other systems reviewed and are negative.   Medications Home Medications No current facility-administered medications on file prior to encounter.   Current Outpatient Medications on File Prior to Encounter  Medication Sig Dispense Refill   cetirizine (ZYRTEC) 10 MG tablet TAKE 1 TABLET BY MOUTH EVERY DAY AS NEEDED FOR ALLERGY 90 tablet 1    cyclobenzaprine (FLEXERIL) 5 MG tablet Take 1 tablet (5 mg total) by mouth 2 (two) times daily as needed for muscle spasms (migraine (sedation precautions)). 30 tablet 0   DULoxetine (CYMBALTA) 30 MG capsule Take 1 capsule (30 mg total) by mouth daily. For anxiety and pain. Take with 60 mg. 90 capsule 1   DULoxetine (CYMBALTA) 60 MG capsule Take 1 capsule (60 mg total) by mouth daily. For anxiety and pain. 90 capsule 1   fluticasone (FLONASE) 50 MCG/ACT nasal spray Place 1 spray into both nostrils 2 (two) times daily. (Patient taking differently: Place 1 spray into both nostrils 2 (two) times daily as needed for allergies or rhinitis.) 16 g 0   levothyroxine (SYNTHROID) 88 MCG tablet TAKE 1 TABLET BY MOUTH EVERY MORNING ON EMPTY STOMACH WITH WATER ONLY-NO FOOD OR OTHER MED FOR 30MIN 90 tablet 1   ondansetron (ZOFRAN-ODT) 4 MG disintegrating tablet Take 1 tablet (4 mg total) by mouth every 8 (eight) hours as needed for nausea. 20 tablet 0   oxybutynin (DITROPAN-XL) 5 MG 24 hr tablet TAKE 1 TABLET (5 MG TOTAL) BY MOUTH AT BEDTIME. FOR BLADDER INCONTINENCE. 90 tablet 2   pantoprazole (PROTONIX) 40 MG tablet Take 1 tablet (40 mg total) by mouth daily. 90 tablet 3   SUMAtriptan (IMITREX) 50 MG tablet  Take 1 tablet by mouth at migraine onset. May repeat with another tablet 2 hours later if needed. 9 tablet 0   topiramate (TOPAMAX) 100 MG tablet TAKE 1 TABLET (100 MG TOTAL) BY MOUTH AT BEDTIME. FOR MIGRAINE PREVENTION. 90 tablet 2   traMADol (ULTRAM) 50 MG tablet Take 50 mg by mouth every 8 (eight) hours as needed.     traZODone (DESYREL) 50 MG tablet Take 3 tablets (150 mg total) by mouth at bedtime. For sleep. 270 tablet 2   Pertinent GI related medications were reviewed with the patient  Inpatient Medications  Current Facility-Administered Medications:    acetaminophen (TYLENOL) tablet 650 mg, 650 mg, Oral, Q6H PRN **OR** acetaminophen (TYLENOL) suppository 650 mg, 650 mg, Rectal, Q6H PRN, Athena Masse, MD   cyclobenzaprine (FLEXERIL) tablet 5 mg, 5 mg, Oral, BID PRN, Athena Masse, MD   dextrose 5 %-0.9 % sodium chloride infusion, , Intravenous, Continuous, Athena Masse, MD, Last Rate: 100 mL/hr at 05/08/21 2224, New Bag at 05/08/21 2224   DULoxetine (CYMBALTA) DR capsule 60 mg, 60 mg, Oral, Daily, Damita Dunnings, Waldemar Dickens, MD   ondansetron (ZOFRAN) tablet 4 mg, 4 mg, Oral, Q6H PRN **OR** ondansetron (ZOFRAN) injection 4 mg, 4 mg, Intravenous, Q6H PRN, Athena Masse, MD   pantoprazole (PROTONIX) injection 40 mg, 40 mg, Intravenous, Once, Carrie Mew, MD   pantoprozole (PROTONIX) 80 mg /NS 100 mL infusion, 8 mg/hr, Intravenous, Continuous, Athena Masse, MD, Last Rate: 10 mL/hr at 05/08/21 2258, 8 mg/hr at 05/08/21 2258   topiramate (TOPAMAX) tablet 100 mg, 100 mg, Oral, QHS, Athena Masse, MD, 100 mg at 05/09/21 0031   traMADol (ULTRAM) tablet 50 mg, 50 mg, Oral, Q6H PRN, Athena Masse, MD   traZODone (DESYREL) tablet 150 mg, 150 mg, Oral, QHS, Athena Masse, MD, 150 mg at 05/09/21 0031  Current Outpatient Medications:    cetirizine (ZYRTEC) 10 MG tablet, TAKE 1 TABLET BY MOUTH EVERY DAY AS NEEDED FOR ALLERGY, Disp: 90 tablet, Rfl: 1   cyclobenzaprine (FLEXERIL) 5 MG tablet, Take 1 tablet (5 mg total) by mouth 2 (two) times daily as needed for muscle spasms (migraine (sedation precautions))., Disp: 30 tablet, Rfl: 0   DULoxetine (CYMBALTA) 30 MG capsule, Take 1 capsule (30 mg total) by mouth daily. For anxiety and pain. Take with 60 mg., Disp: 90 capsule, Rfl: 1   DULoxetine (CYMBALTA) 60 MG capsule, Take 1 capsule (60 mg total) by mouth daily. For anxiety and pain., Disp: 90 capsule, Rfl: 1   fluticasone (FLONASE) 50 MCG/ACT nasal spray, Place 1 spray into both nostrils 2 (two) times daily. (Patient taking differently: Place 1 spray into both nostrils 2 (two) times daily as needed for allergies or rhinitis.), Disp: 16 g, Rfl: 0   levothyroxine (SYNTHROID) 88 MCG tablet, TAKE  1 TABLET BY MOUTH EVERY MORNING ON EMPTY STOMACH WITH WATER ONLY-NO FOOD OR OTHER MED FOR 30MIN, Disp: 90 tablet, Rfl: 1   ondansetron (ZOFRAN-ODT) 4 MG disintegrating tablet, Take 1 tablet (4 mg total) by mouth every 8 (eight) hours as needed for nausea., Disp: 20 tablet, Rfl: 0   oxybutynin (DITROPAN-XL) 5 MG 24 hr tablet, TAKE 1 TABLET (5 MG TOTAL) BY MOUTH AT BEDTIME. FOR BLADDER INCONTINENCE., Disp: 90 tablet, Rfl: 2   pantoprazole (PROTONIX) 40 MG tablet, Take 1 tablet (40 mg total) by mouth daily., Disp: 90 tablet, Rfl: 3   SUMAtriptan (IMITREX) 50 MG tablet, Take 1 tablet by mouth at migraine onset.  May repeat with another tablet 2 hours later if needed., Disp: 9 tablet, Rfl: 0   topiramate (TOPAMAX) 100 MG tablet, TAKE 1 TABLET (100 MG TOTAL) BY MOUTH AT BEDTIME. FOR MIGRAINE PREVENTION., Disp: 90 tablet, Rfl: 2   traMADol (ULTRAM) 50 MG tablet, Take 50 mg by mouth every 8 (eight) hours as needed., Disp: , Rfl:    traZODone (DESYREL) 50 MG tablet, Take 3 tablets (150 mg total) by mouth at bedtime. For sleep., Disp: 270 tablet, Rfl: 2  dextrose 5 % and 0.9% NaCl 100 mL/hr at 05/08/21 2224   pantoprazole 8 mg/hr (05/08/21 2258)    acetaminophen **OR** acetaminophen, cyclobenzaprine, ondansetron **OR** ondansetron (ZOFRAN) IV, traMADol   Objective   Vitals:   05/08/21 1830 05/08/21 2000 05/09/21 0029 05/09/21 0412  BP: 124/61 121/64 114/68 (!) 101/56  Pulse: 71 66 67 69  Resp: '18 17 11 18  '$ Temp:   98.2 F (36.8 C) 98.2 F (36.8 C)  TempSrc:   Oral Oral  SpO2: 100% 99% 100% 99%  Weight:      Height:         Physical Exam Vitals and nursing note reviewed.  Constitutional:      General: She is not in acute distress.    Appearance: Normal appearance. She is not ill-appearing, toxic-appearing or diaphoretic.  HENT:     Head: Normocephalic and atraumatic.     Nose: Nose normal.     Mouth/Throat:     Mouth: Mucous membranes are moist.     Pharynx: Oropharynx is clear.  Eyes:      General: No scleral icterus.    Extraocular Movements: Extraocular movements intact.  Cardiovascular:     Rate and Rhythm: Normal rate and regular rhythm.     Heart sounds: Normal heart sounds. No murmur heard.   No friction rub. No gallop.  Pulmonary:     Effort: Pulmonary effort is normal. No respiratory distress.     Breath sounds: Normal breath sounds. No wheezing, rhonchi or rales.  Abdominal:     General: Bowel sounds are normal. There is no distension.     Palpations: Abdomen is soft.     Tenderness: There is no abdominal tenderness. There is no guarding or rebound.  Musculoskeletal:     Cervical back: Neck supple.     Right lower leg: No edema.     Left lower leg: No edema.  Skin:    General: Skin is warm and dry.     Coloration: Skin is not jaundiced or pale.  Neurological:     General: No focal deficit present.     Mental Status: She is alert and oriented to person, place, and time. Mental status is at baseline.  Psychiatric:        Mood and Affect: Mood normal.        Behavior: Behavior normal.        Thought Content: Thought content normal.        Judgment: Judgment normal.    Laboratory Data Recent Labs  Lab 05/08/21 1321 05/09/21 0126  WBC 8.3  --   HGB 12.3 9.8*  HCT 38.0 29.9*  PLT 322  --    Recent Labs  Lab 05/08/21 1321  NA 139  K 3.9  CL 109  CO2 23  BUN 33*  CALCIUM 8.5*  PROT 6.1*  BILITOT 0.7  ALKPHOS 44  ALT 12  AST 21  GLUCOSE 112*   No results for input(s): INR in the last  168 hours.  No results for input(s): LIPASE in the last 72 hours.      Imaging Studies: No results found.  Assessment:   # Melena; suspected ugib - heavy nsaid use - h/o ulceration - s/p gastrojejunostomy in 2019 from previous GOO - no h/o liver disease - initial hgb 12.3 --> 9.8 -bun/cr 33/0.75 - VSS  # Acute blood loss Anemia 2/2 above  # NSAID abuse  # GERD- on home ppi  # Tooth pain  # Light headedness/dizzy/ha - suspect rebound  HA from ibuprofen and new anemia  Plan:   Esophagogastroduodenoscopy planned for today pending patient stability and endoscopy suite availability NPO Protonix 40 mg iv q12 h Hold dvt ppx Monitor H&H.  Transfusion and resuscitation as per primary team Avoid frequent lab draws to prevent lab induced anemia Supportive care and antiemetics as per primary team Maintain two sites IV access Avoid nsaids Monitor for GIB.  Esophagogastroduodenoscopy with possible biopsy, control of bleeding, polypectomy, and interventions as necessary has been discussed with the patient/patient representative. Informed consent was obtained from the patient/patient representative after explaining the indication, nature, and risks of the procedure including but not limited to death, bleeding, perforation, missed neoplasm/lesions, cardiorespiratory compromise, and reaction to medications. Opportunity for questions was given and appropriate answers were provided. Patient/patient representative has verbalized understanding is amenable to undergoing the procedure.   I personally performed the service.  Management of other medical comorbidities as per primary team  Thank you for allowing [place] to participate in this patient's care. Please don't hesitate to call if any questions or concerns arise.   Annamaria Helling, DO [facility]  Portions of the record may have been created with voice recognition software. Occasional wrong-word or 'sound-a-like' substitutions may have occurred due to the inherent limitations of voice recognition software.  Read the chart carefully and recognize, using context, where substitutions may have occurred.

## 2021-05-09 NOTE — Consult Note (Signed)
Inpatient Consultation   Patient ID: Tanya Nguyen is a 56 y.o. female.  Requesting Provider: Dr. 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  Pt has had ongoing tooth pain for several months and has been taking ibuprofen '600mg'$  every 6 hours. She does take a once daily ppi for reflux. Yesterday she noted black bowel movements and had one episode of dark emesis. No hematochezia or hematemesis. She notes headache and light headedness but no sob or chest pain. Her appetite has been diminished over the last 3 days.  No current abdominal pain. 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 Ibuprofen '600mg'$  q6h for multiple months Denies Anti-plt agents, and anticoagulants Denies family history of gastrointestinal disease and malignancy Previous Endoscopies: 02/2018- EGD/Colonoscopy- Gastrojejunostomy, gastric stenosis at the pylorus, normal examined jejunum - Fair prep, liquid stool throughout the colon   Past Medical History:  Diagnosis Date   Acquired pyloric stricture    Bleeding duodenal ulcer    Chronic esophagogastric ulcer    Depression    Duodenal obstruction    Gastric outlet obstruction 01/19/2018   GERD (gastroesophageal reflux disease)    Hair loss 09/22/2018   History of stomach ulcers    Hypothyroidism    Incisional hernia, without obstruction or gangrene    Intractable vomiting    Kidney stones    Migraine    Migraine headache    only1-2x/month since starting topamax   Neuropathy    Side pain 03/30/2018    Past Surgical History:  Procedure Laterality Date   ABDOMINAL  HYSTERECTOMY  2004   partial   CHOLECYSTECTOMY     COLONOSCOPY WITH PROPOFOL N/A 02/27/2018   Procedure: COLONOSCOPY WITH PROPOFOL;  Surgeon: [name], MD;  Location: [facility];  Service: Endoscopy;  Laterality: N/A;   ESOPHAGOGASTRODUODENOSCOPY (EGD) WITH PROPOFOL N/A 01/12/2018   Procedure: ESOPHAGOGASTRODUODENOSCOPY (EGD) WITH BIOPSIES;  Surgeon: [name], MD;  Location: [place];  Service: Endoscopy;  Laterality: N/A;   ESOPHAGOGASTRODUODENOSCOPY (EGD) WITH PROPOFOL N/A 01/25/2018   Procedure: ESOPHAGOGASTRODUODENOSCOPY (EGD) WITH PROPOFOL;  Surgeon: Jonathon Bellows, MD;  Location[facility];  Service: Gastroenterology;  Laterality: N/A;   ESOPHAGOGASTRODUODENOSCOPY (EGD) WITH PROPOFOL N/A 02/27/2018   Procedure: ESOPHAGOGASTRODUODENOSCOPY (EGD) WITH PROPOFOL;  Surgeon: [name], MD;  Location: [place];  Service: Endoscopy;  Laterality: N/A;   INCISIONAL HERNIA REPAIR N/A 11/23/2018   Procedure: HERNIA REPAIR INCISIONAL;  Surgeon: Olean Ree, MD;  Location: [facility];  Service: General;  Laterality: N/A;   INSERTION OF MESH N/A 11/23/2018   Procedure: INSERTION OF MESH;  Surgeon: Olean Ree, MD;  Location: [facility];  Service: General;  Laterality: N/A;   LAPAROTOMY N/A 01/20/2018   Procedure: EXPLORATORY LAPAROTOMY;  Surgeon: Olean Ree, MD;  Location: [facility];  Service: General;  Laterality: N/A;   TOOTH EXTRACTION      Allergies  Allergen Reactions   Penicillins Shortness Of Breath, Diarrhea and Nausea And Vomiting    Has patient had a PCN reaction causing immediate rash, facial/tongue/throat swelling, SOB or lightheadedness with hypotension: yes Has patient had a PCN reaction causing severe rash involving mucus membranes or skin necrosis:  no Has patient had a PCN reaction that required hospitalization no Has patient had a PCN reaction occurring within the last 10 years: about 10 years If all of the above answers are "NO", then may  proceed with Cephalosporin use.    Gabapentin Other (See Comments)    Hair Loss    Pregabalin     Tremors   Sulfa Antibiotics Nausea And Vomiting    Family History  Problem Relation Age of Onset   Cancer Mother    Breast cancer Mother 60   Lung cancer Father    Alcohol abuse Sister    Bipolar disorder Sister    Alcohol abuse Maternal Grandmother    Stroke Maternal Grandmother    Stroke Paternal Grandmother     Social History   Tobacco Use   Smoking status: Never   Smokeless tobacco: Never  Vaping Use   Vaping Use: Never used  Substance Use Topics   Alcohol use: Not Currently    Alcohol/week: 0.0 standard drinks   Drug use: No     Pertinent GI related history and allergies were reviewed with the patient  Review of Systems  Constitutional:  Positive for appetite change (diminished). Negative for activity change, chills, diaphoresis, fatigue, fever and unexpected weight change.  HENT:  Negative for trouble swallowing and voice change.   Respiratory:  Negative for shortness of breath and wheezing.   Cardiovascular:  Negative for chest pain, palpitations and leg swelling.  Gastrointestinal:  Positive for abdominal pain (mild), blood in stool (melena), diarrhea, nausea and vomiting (x1). Negative for abdominal distention, anal bleeding, constipation and rectal pain.  Musculoskeletal:  Negative for arthralgias and myalgias.  Skin:  Negative for color change and pallor.  Neurological:  Positive for dizziness, weakness, light-headedness and headaches. Negative for syncope.  Psychiatric/Behavioral:  Negative for confusion.   All other systems reviewed and are negative.   Medications Home Medications No current facility-administered medications on file prior to encounter.   Current Outpatient Medications on File Prior to Encounter  Medication Sig Dispense Refill   cetirizine (ZYRTEC) 10 MG tablet TAKE 1 TABLET BY MOUTH EVERY DAY AS NEEDED FOR ALLERGY 90 tablet 1    cyclobenzaprine (FLEXERIL) 5 MG tablet Take 1 tablet (5 mg total) by mouth 2 (two) times daily as needed for muscle spasms (migraine (sedation precautions)). 30 tablet 0   DULoxetine (CYMBALTA) 30 MG capsule Take 1 capsule (30 mg total) by mouth daily. For anxiety and pain. Take with 60 mg. 90 capsule 1   DULoxetine (CYMBALTA) 60 MG capsule Take 1 capsule (60 mg total) by mouth daily. For anxiety and pain. 90 capsule 1   fluticasone (FLONASE) 50 MCG/ACT nasal spray Place 1 spray into both nostrils 2 (two) times daily. (Patient taking differently: Place 1 spray into both nostrils 2 (two) times daily as needed for allergies or rhinitis.) 16 g 0   levothyroxine (SYNTHROID) 88 MCG tablet TAKE 1 TABLET BY MOUTH EVERY MORNING ON EMPTY STOMACH WITH WATER ONLY-NO FOOD OR OTHER MED FOR 30MIN 90 tablet 1   ondansetron (ZOFRAN-ODT) 4 MG disintegrating tablet Take 1 tablet (4 mg total) by mouth every 8 (eight) hours as needed for nausea. 20 tablet 0   oxybutynin (DITROPAN-XL) 5 MG 24 hr tablet TAKE 1 TABLET (5 MG TOTAL) BY MOUTH AT BEDTIME. FOR BLADDER INCONTINENCE. 90 tablet 2   pantoprazole (PROTONIX) 40 MG tablet Take 1 tablet (40 mg total) by mouth daily. 90 tablet 3   SUMAtriptan (IMITREX) 50 MG tablet  Take 1 tablet by mouth at migraine onset. May repeat with another tablet 2 hours later if needed. 9 tablet 0   topiramate (TOPAMAX) 100 MG tablet TAKE 1 TABLET (100 MG TOTAL) BY MOUTH AT BEDTIME. FOR MIGRAINE PREVENTION. 90 tablet 2   traMADol (ULTRAM) 50 MG tablet Take 50 mg by mouth every 8 (eight) hours as needed.     traZODone (DESYREL) 50 MG tablet Take 3 tablets (150 mg total) by mouth at bedtime. For sleep. 270 tablet 2   Pertinent GI related medications were reviewed with the patient  Inpatient Medications  Current Facility-Administered Medications:    acetaminophen (TYLENOL) tablet 650 mg, 650 mg, Oral, Q6H PRN **OR** acetaminophen (TYLENOL) suppository 650 mg, 650 mg, Rectal, Q6H PRN, Athena Masse, MD   cyclobenzaprine (FLEXERIL) tablet 5 mg, 5 mg, Oral, BID PRN, Athena Masse, MD   dextrose 5 %-0.9 % sodium chloride infusion, , Intravenous, Continuous, Athena Masse, MD, Last Rate: 100 mL/hr at 05/08/21 2224, New Bag at 05/08/21 2224   DULoxetine (CYMBALTA) DR capsule 60 mg, 60 mg, Oral, Daily, Damita Dunnings, Waldemar Dickens, MD   ondansetron (ZOFRAN) tablet 4 mg, 4 mg, Oral, Q6H PRN **OR** ondansetron (ZOFRAN) injection 4 mg, 4 mg, Intravenous, Q6H PRN, Athena Masse, MD   pantoprazole (PROTONIX) injection 40 mg, 40 mg, Intravenous, Once, Carrie Mew, MD   pantoprozole (PROTONIX) 80 mg /NS 100 mL infusion, 8 mg/hr, Intravenous, Continuous, Athena Masse, MD, Last Rate: 10 mL/hr at 05/08/21 2258, 8 mg/hr at 05/08/21 2258   topiramate (TOPAMAX) tablet 100 mg, 100 mg, Oral, QHS, Athena Masse, MD, 100 mg at 05/09/21 0031   traMADol (ULTRAM) tablet 50 mg, 50 mg, Oral, Q6H PRN, Athena Masse, MD   traZODone (DESYREL) tablet 150 mg, 150 mg, Oral, QHS, Athena Masse, MD, 150 mg at 05/09/21 0031  Current Outpatient Medications:    cetirizine (ZYRTEC) 10 MG tablet, TAKE 1 TABLET BY MOUTH EVERY DAY AS NEEDED FOR ALLERGY, Disp: 90 tablet, Rfl: 1   cyclobenzaprine (FLEXERIL) 5 MG tablet, Take 1 tablet (5 mg total) by mouth 2 (two) times daily as needed for muscle spasms (migraine (sedation precautions))., Disp: 30 tablet, Rfl: 0   DULoxetine (CYMBALTA) 30 MG capsule, Take 1 capsule (30 mg total) by mouth daily. For anxiety and pain. Take with 60 mg., Disp: 90 capsule, Rfl: 1   DULoxetine (CYMBALTA) 60 MG capsule, Take 1 capsule (60 mg total) by mouth daily. For anxiety and pain., Disp: 90 capsule, Rfl: 1   fluticasone (FLONASE) 50 MCG/ACT nasal spray, Place 1 spray into both nostrils 2 (two) times daily. (Patient taking differently: Place 1 spray into both nostrils 2 (two) times daily as needed for allergies or rhinitis.), Disp: 16 g, Rfl: 0   levothyroxine (SYNTHROID) 88 MCG tablet, TAKE  1 TABLET BY MOUTH EVERY MORNING ON EMPTY STOMACH WITH WATER ONLY-NO FOOD OR OTHER MED FOR 30MIN, Disp: 90 tablet, Rfl: 1   ondansetron (ZOFRAN-ODT) 4 MG disintegrating tablet, Take 1 tablet (4 mg total) by mouth every 8 (eight) hours as needed for nausea., Disp: 20 tablet, Rfl: 0   oxybutynin (DITROPAN-XL) 5 MG 24 hr tablet, TAKE 1 TABLET (5 MG TOTAL) BY MOUTH AT BEDTIME. FOR BLADDER INCONTINENCE., Disp: 90 tablet, Rfl: 2   pantoprazole (PROTONIX) 40 MG tablet, Take 1 tablet (40 mg total) by mouth daily., Disp: 90 tablet, Rfl: 3   SUMAtriptan (IMITREX) 50 MG tablet, Take 1 tablet by mouth at migraine onset.  May repeat with another tablet 2 hours later if needed., Disp: 9 tablet, Rfl: 0   topiramate (TOPAMAX) 100 MG tablet, TAKE 1 TABLET (100 MG TOTAL) BY MOUTH AT BEDTIME. FOR MIGRAINE PREVENTION., Disp: 90 tablet, Rfl: 2   traMADol (ULTRAM) 50 MG tablet, Take 50 mg by mouth every 8 (eight) hours as needed., Disp: , Rfl:    traZODone (DESYREL) 50 MG tablet, Take 3 tablets (150 mg total) by mouth at bedtime. For sleep., Disp: 270 tablet, Rfl: 2  dextrose 5 % and 0.9% NaCl 100 mL/hr at 05/08/21 2224   pantoprazole 8 mg/hr (05/08/21 2258)    acetaminophen **OR** acetaminophen, cyclobenzaprine, ondansetron **OR** ondansetron (ZOFRAN) IV, traMADol   Objective   Vitals:   05/08/21 1830 05/08/21 2000 05/09/21 0029 05/09/21 0412  BP: 124/61 121/64 114/68 (!) 101/56  Pulse: 71 66 67 69  Resp: '18 17 11 18  '$ Temp:   98.2 F (36.8 C) 98.2 F (36.8 C)  TempSrc:   Oral Oral  SpO2: 100% 99% 100% 99%  Weight:      Height:         Physical Exam Vitals and nursing note reviewed.  Constitutional:      General: She is not in acute distress.    Appearance: Normal appearance. She is not ill-appearing, toxic-appearing or diaphoretic.  HENT:     Head: Normocephalic and atraumatic.     Nose: Nose normal.     Mouth/Throat:     Mouth: Mucous membranes are moist.     Pharynx: Oropharynx is clear.  Eyes:      General: No scleral icterus.    Extraocular Movements: Extraocular movements intact.  Cardiovascular:     Rate and Rhythm: Normal rate and regular rhythm.     Heart sounds: Normal heart sounds. No murmur heard.   No friction rub. No gallop.  Pulmonary:     Effort: Pulmonary effort is normal. No respiratory distress.     Breath sounds: Normal breath sounds. No wheezing, rhonchi or rales.  Abdominal:     General: Bowel sounds are normal. There is no distension.     Palpations: Abdomen is soft.     Tenderness: There is no abdominal tenderness. There is no guarding or rebound.  Musculoskeletal:     Cervical back: Neck supple.     Right lower leg: No edema.     Left lower leg: No edema.  Skin:    General: Skin is warm and dry.     Coloration: Skin is not jaundiced or pale.  Neurological:     General: No focal deficit present.     Mental Status: She is alert and oriented to person, place, and time. Mental status is at baseline.  Psychiatric:        Mood and Affect: Mood normal.        Behavior: Behavior normal.        Thought Content: Thought content normal.        Judgment: Judgment normal.    Laboratory Data Recent Labs  Lab 05/08/21 1321 05/09/21 0126  WBC 8.3  --   HGB 12.3 9.8*  HCT 38.0 29.9*  PLT 322  --    Recent Labs  Lab 05/08/21 1321  NA 139  K 3.9  CL 109  CO2 23  BUN 33*  CALCIUM 8.5*  PROT 6.1*  BILITOT 0.7  ALKPHOS 44  ALT 12  AST 21  GLUCOSE 112*   No results for input(s): INR in the last  168 hours.  No results for input(s): LIPASE in the last 72 hours.      Imaging Studies: No results found.  Assessment:   # Melena; suspected ugib - heavy nsaid use - h/o ulceration - s/p gastrojejunostomy in 2019 from previous GOO - no h/o liver disease - initial hgb 12.3 --> 9.8 -bun/cr 33/0.75 - VSS  # Acute blood loss Anemia 2/2 above  # NSAID abuse  # GERD- on home ppi  # Tooth pain  # Light headedness/dizzy/ha - suspect rebound  HA from ibuprofen and new anemia  Plan:   Esophagogastroduodenoscopy planned for today pending patient stability and endoscopy suite availability NPO Protonix 40 mg iv q12 h Hold dvt ppx Monitor H&H.  Transfusion and resuscitation as per primary team Avoid frequent lab draws to prevent lab induced anemia Supportive care and antiemetics as per primary team Maintain two sites IV access Avoid nsaids Monitor for GIB.  Esophagogastroduodenoscopy with possible biopsy, control of bleeding, polypectomy, and interventions as necessary has been discussed with the patient/patient representative. Informed consent was obtained from the patient/patient representative after explaining the indication, nature, and risks of the procedure including but not limited to death, bleeding, perforation, missed neoplasm/lesions, cardiorespiratory compromise, and reaction to medications. Opportunity for questions was given and appropriate answers were provided. Patient/patient representative has verbalized understanding is amenable to undergoing the procedure.   I personally performed the service.  Management of other medical comorbidities as per primary team  Thank you for allowing [place] to participate in this patient's care. Please don't hesitate to call if any questions or concerns arise.   Annamaria Helling, DO [facility]  Portions of the record may have been created with voice recognition software. Occasional wrong-word or 'sound-a-like' substitutions may have occurred due to the inherent limitations of voice recognition software.  Read the chart carefully and recognize, using context, where substitutions may have occurred.

## 2021-05-09 NOTE — Transfer of Care (Signed)
Immediate Anesthesia Transfer of Care Note ? ?Patient: Tanya Nguyen ? ?Procedure(s) Performed: ESOPHAGOGASTRODUODENOSCOPY (EGD) WITH PROPOFOL ? ?Patient Location: PACU ? ?Anesthesia Type:General ? ?Level of Consciousness: sedated ? ?Airway & Oxygen Therapy: Patient Spontanous Breathing ? ?Post-op Assessment: Report given to RN and Post -op Vital signs reviewed and stable ? ?Post vital signs: Reviewed and stable ? ?Last Vitals:  ?Vitals Value Taken Time  ?BP    ?Temp    ?Pulse    ?Resp    ?SpO2    ? ? ?Last Pain:  ?Vitals:  ? 05/09/21 1036  ?TempSrc: Temporal  ?PainSc: 0-No pain  ?   ? ?  ? ?Complications: No notable events documented. ?

## 2021-05-09 NOTE — Progress Notes (Addendum)
? ? ? ?Progress Note  ? ? ?Tanya Nguyen  DZH:[id number] DOB: 03-05-1966  DOA: 05/08/2021 ?PCP: Pleas Koch, NP  ? ? ? ? ?Brief Narrative:  ? ? ?Medical records reviewed and are as summarized below: ? ?Tanya Nguyen is a 56 y.o. 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  She has also been feeling very weak.  She said she had been using NSAIDs for about 2 weeks now because of dental pain. ? ?She was admitted to the hospital for acute upper GI bleeding. ? ? ? ? ? ?Assessment/Plan:  ? ?Principal Problem: ?  Melena ?Active Problems: ?  NSAID long-term use ?  Hx of bypass gastrojejunostomy ?  Migraine ?  Hypothyroidism ?  Depression ? ? ?Body mass index is 22.82 kg/m?. ? ? ?Acute upper GI bleeding: S/p EGD which showed normal esophagus, normal jejunum and Hematin gastric fluid.  Continue IV Protonix and IV fluids.  Avoid NSAIDs.  Case discussed with Dr. Virgina Jock, gastroenterologist.  He said patient may need surgical or IR evaluation if bleeding recurs.  ? ?Acute blood loss anemia: No indication for blood transfusion at this time.  Monitor H&H. ? ?Hypotension: BP is improving.  Continue IV fluids ? ?Other comorbidities include depression, hypothyroidism, migraine, history of bypass gastrojejunostomy ? ? ? ?Diet Order   ? ?       ?  Diet NPO time specified  Diet effective now       ?  ? ?  ?  ? ?  ? ? ? ? ? ? ? ?Consultants: ?Gastroenterologist ? ?Procedures: ?EGD ? ? ? ?Medications:  ? ? [MAR Hold] DULoxetine  60 mg Oral Daily  ? [MAR Hold] pantoprazole (PROTONIX) IV  40 mg Intravenous Once  ? [MAR Hold] topiramate  100 mg Oral QHS  ? [MAR Hold] traZODone  150 mg Oral QHS  ? ?Continuous Infusions: ? sodium chloride 20 mL/hr at 05/09/21 1045  ? dextrose 5 % and 0.9% NaCl 100 mL/hr at  05/08/21 2224  ? pantoprazole 8 mg/hr (05/09/21 0945)  ? ? ? ?Anti-infectives (From admission, onward)  ? ? None  ? ?  ? ? ? ? ? ? ? ? ? ?Family Communication/Anticipated D/C date and plan/Code Status  ? ?DVT prophylaxis: SCDs Start: 05/08/21 2105 ? ? ?  Code Status: Full Code ? ?Family Communication: None ?Disposition Plan: Plan to discharge home in 1 to 2 days ? ? ?Status is: Inpatient ?Remains inpatient appropriate because: GI bleeding ? ? ? ? ? ? ? ? ? ? ? ? ? ? ?Subjective:  ? ?Interval events noted.  She said she vomited coffee-ground material and dark stools for 2 days.  However, she has not had any vomiting or black stools today. ? ?Objective:  ? ? ?Vitals:  ? 05/09/21 1132 05/09/21 1142 05/09/21 1152 05/09/21 1219  ?BP: 117/64 113/61 118/72 (!) 113/55  ?Pulse: 82 69 72 66  ?Resp: 15 16 (!) 21 18  ?Temp:    97.8 ?F (36.6 ?C)  ?TempSrc:      ?SpO2: 97% 100% 100% 100%  ?Weight:      ?Height:      ? ?No data found. ? ?No intake or output data in the 24 hours ending 05/09/21 1236 ?Filed Weights  ? 05/08/21 1315  ?Weight: 51.3 kg  ? ? ?  Exam: ? ?GEN: NAD ?SKIN: No rash ?EYES: EOMI ?ENT: MMM ?CV: RRR ?PULM: CTA B ?ABD: soft, ND, NT, +BS ?CNS: AAO x 3, non focal ?EXT: No edema or tenderness ? ? ? ?  ? ? ?Data Reviewed:  ? ?I have personally reviewed following labs and imaging studies: ? ?Labs: ?Labs show the following:  ? ?Basic Metabolic Panel: ?Recent Labs  ?Lab 05/08/21 ?1321  ?NA 139  ?K 3.9  ?CL 109  ?CO2 23  ?GLUCOSE 112*  ?BUN 33*  ?CREATININE 0.75  ?CALCIUM 8.5*  ? ?GFR ?Estimated Creatinine Clearance: 53.6 mL/min (by C-G formula based on SCr of 0.75 mg/dL). ?Liver Function Tests: ?Recent Labs  ?Lab 05/08/21 ?1321  ?AST 21  ?ALT 12  ?ALKPHOS 44  ?BILITOT 0.7  ?PROT 6.1*  ?ALBUMIN 3.4*  ? ?No results for input(s): LIPASE, AMYLASE in the last 168 hours. ?No results for input(s): AMMONIA in the last 168 hours. ?Coagulation profile ?No results for input(s): INR, PROTIME in the last 168 hours. ? ?CBC: ?Recent  Labs  ?Lab 05/08/21 ?1321 05/09/21 ?0126  ?WBC 8.3  --   ?HGB 12.3 9.8*  ?HCT 38.0 29.9*  ?MCV 96.0  --   ?PLT 322  --   ? ?Cardiac Enzymes: ?No results for input(s): CKTOTAL, CKMB, CKMBINDEX, TROPONINI in the last 168 hours. ?BNP (last 3 results) ?No results for input(s): PROBNP in the last 8760 hours. ?CBG: ?No results for input(s): GLUCAP in the last 168 hours. ?D-Dimer: ?No results for input(s): DDIMER in the last 72 hours. ?Hgb A1c: ?No results for input(s): HGBA1C in the last 72 hours. ?Lipid Profile: ?No results for input(s): CHOL, HDL, LDLCALC, TRIG, CHOLHDL, LDLDIRECT in the last 72 hours. ?Thyroid function studies: ?No results for input(s): TSH, T4TOTAL, T3FREE, THYROIDAB in the last 72 hours. ? ?Invalid input(s): FREET3 ?Anemia work up: ?No results for input(s): VITAMINB12, FOLATE, FERRITIN, TIBC, IRON, RETICCTPCT in the last 72 hours. ?Sepsis Labs: ?Recent Labs  ?Lab 05/08/21 ?1321  ?WBC 8.3  ? ? ?Microbiology ?Recent Results (from the past 240 hour(s))  ?Resp Panel by RT-PCR (Flu A&B, Covid) Nasopharyngeal Swab     Status: None  ? Collection Time: 05/08/21  4:47 PM  ? Specimen: Nasopharyngeal Swab; Nasopharyngeal(NP) swabs in vial transport medium  ?Result Value Ref Range Status  ? SARS Coronavirus 2 by RT PCR NEGATIVE NEGATIVE Final  ?  Comment: (NOTE) ?SARS-CoV-2 target nucleic acids are NOT DETECTED. ? ?The SARS-CoV-2 RNA is generally detectable in upper respiratory ?specimens during the acute phase of infection. The lowest ?concentration of SARS-CoV-2 viral copies this assay can detect is ?138 copies/mL. A negative result does not preclude SARS-Cov-2 ?infection and should not be used as the sole basis for treatment or ?other patient management decisions. A negative result may occur with  ?improper specimen collection/handling, submission of specimen other ?than nasopharyngeal swab, presence of viral mutation(s) within the ?areas targeted by this assay, and inadequate number of viral ?copies(<138  copies/mL). A negative result must be combined with ?clinical observations, patient history, and epidemiological ?information. The expected result is Negative. ? ?Fact Sheet for Patients:  ?EntrepreneurPulse.com.au ? ?Fact Sheet for Healthcare Providers:  ?IncredibleEmployment.be ? ?This test is no t yet approved or cleared by the [place] FDA and  ?has been authorized for detection and/or diagnosis of SARS-CoV-2 by ?FDA under an Emergency Use Authorization (EUA). This EUA will remain  ?in effect (meaning this test can be used) for the duration of the ?COVID-19 declaration under Section 564(b)(1) of the  Act, 21 ?U.S.C.section 360bbb-3(b)(1), unless the authorization is terminated  ?or revoked sooner.  ? ? ?  ? Influenza A by PCR NEGATIVE NEGATIVE Final  ? Influenza B by PCR NEGATIVE NEGATIVE Final  ?  Comment: (NOTE) ?The Xpert Xpress SARS-CoV-2/FLU/RSV plus assay is intended as an aid ?in the diagnosis of influenza from Nasopharyngeal swab specimens and ?should not be used as a sole basis for treatment. Nasal washings and ?aspirates are unacceptable for Xpert Xpress SARS-CoV-2/FLU/RSV ?testing. ? ?Fact Sheet for Patients: ?EntrepreneurPulse.com.au ? ?Fact Sheet for Healthcare Providers: ?IncredibleEmployment.be ? ?This test is not yet approved or cleared by the [place] FDA and ?has been authorized for detection and/or diagnosis of SARS-CoV-2 by ?FDA under an Emergency Use Authorization (EUA). This EUA will remain ?in effect (meaning this test can be used) for the duration of the ?COVID-19 declaration under Section 564(b)(1) of the Act, 21 U.S.C. ?section 360bbb-3(b)(1), unless the authorization is terminated or ?revoked. ? ?Performed at [facility], [place], ?[place] [address] ?  ? ? ?Procedures and diagnostic studies: ? ?No results found. ? ? ? ? ? ? ? ? ? ? ? ? LOS: 1 day  ? ?Tanya Nguyen  ?[facility]  ? ?Pager on [email or web address]. If 7PM-7AM, please contact night-coverage at [email or web address] ? ? ? ? ?05/09/2021, 12:36 PM  ? ? ? ? ? ? ? ? ? ?

## 2021-05-09 NOTE — Anesthesia Postprocedure Evaluation (Signed)
Anesthesia Post Note ? ?Patient: Tanya Nguyen ? ?Procedure(s) Performed: ESOPHAGOGASTRODUODENOSCOPY (EGD) WITH PROPOFOL ? ?Patient location during evaluation: Endoscopy ?Anesthesia Type: General ?Level of consciousness: awake and alert ?Pain management: pain level controlled ?Vital Signs Assessment: post-procedure vital signs reviewed and stable ?Respiratory status: spontaneous breathing, nonlabored ventilation, respiratory function stable and patient connected to nasal cannula oxygen ?Cardiovascular status: blood pressure returned to baseline and stable ?Postop Assessment: no apparent nausea or vomiting ?Anesthetic complications: no ? ? ?No notable events documented. ? ? ?Last Vitals:  ?Vitals:  ? 05/09/21 1657 05/09/21 2017  ?BP: 118/69 107/68  ?Pulse: 65 74  ?Resp: 18 15  ?Temp: 36.5 ?C 36.8 ?C  ?SpO2: 100% 100%  ?  ?Last Pain:  ?Vitals:  ? 05/09/21 2017  ?TempSrc: Oral  ?PainSc:   ? ? ?  ?  ?  ?  ?  ?  ? ?Martha Clan ? ? ? ? ?

## 2021-05-09 NOTE — Op Note (Signed)
[facility] ?Gastroenterology ?Patient Name: Tanya Nguyen ?Procedure Date: 05/09/2021 10:05 AM ?MRN: [id number] ?Account #: [id number] ?Date of Birth: 1965/12/08 ?Admit Type: Inpatient ?Age: 56 ?Room: [facility] ENDO ROOM 4 ?Gender: Female ?Note Status: Finalized ?Instrument Name: Upper Endoscope [id number] ?Procedure:             Upper GI endoscopy ?Indications:           Melena ?Providers:             Annamaria Helling DO, DO ?Referring MD:          Pleas Koch (Referring MD) ?Medicines:             Monitored Anesthesia Care ?Complications:         No immediate complications. Estimated blood loss: None. ?Procedure:             Pre-Anesthesia Assessment: ?                       - Prior to the procedure, a History and Physical was  ?                       performed, and patient medications and allergies were  ?                       reviewed. The patient is competent. The risks and  ?                       benefits of the procedure and the sedation options and  ?                       risks were discussed with the patient. All questions  ?                       were answered and informed consent was obtained.  ?                       Patient identification and proposed procedure were  ?                       verified by the physician, the nurse, the anesthetist  ?                       and the technician in the endoscopy suite. Mental  ?                       Status Examination: alert and oriented. Airway  ?                       Examination: normal oropharyngeal airway and neck  ?                       mobility. Respiratory Examination: clear to  ?                       auscultation. CV Examination: RRR, no murmurs, no S3  ?                       or S4. Prophylactic Antibiotics: The patient does not  ?  require prophylactic antibiotics. Prior  ?                       Anticoagulants: The patient has taken no previous  ?                       anticoagulant or antiplatelet agents.  ASA Grade  ?                       Assessment: III - A patient with severe systemic  ?                       disease. After reviewing the risks and benefits, the  ?                       patient was deemed in satisfactory condition to  ?                       undergo the procedure. The anesthesia plan was to use  ?                       monitored anesthesia care (MAC). Immediately prior to  ?                       administration of medications, the patient was  ?                       re-assessed for adequacy to receive sedatives. The  ?                       heart rate, respiratory rate, oxygen saturations,  ?                       blood pressure, adequacy of pulmonary ventilation, and  ?                       response to care were monitored throughout the  ?                       procedure. The physical status of the patient was  ?                       re-assessed after the procedure. ?                       After obtaining informed consent, the endoscope was  ?                       passed under direct vision. Throughout the procedure,  ?                       the patient's blood pressure, pulse, and oxygen  ?                       saturations were monitored continuously. The Endoscope  ?                       was introduced through the mouth, and advanced to the  ?  afferent and efferent jejunal loops. The upper GI  ?                       endoscopy was technically difficult and complex due to  ?                       post-surgical anatomy. Successful completion of the  ?                       procedure was aided by applying abdominal pressure.  ?                       The patient tolerated the procedure well. ?Findings: ?     The examined jejunum was normal. old blood noted in [place] anastamosis and  ?     JJ anastamosis, but no signs of ulceration or fresh bleeding Estimated  ?     blood loss: none. ?     Hematin fluid was found in the entire examined stomach. Remnant stomach  ?     evacuated  of old blood w/o fresh bleeding. Area visualized- unclear if  ?     this was native pylorus - this area was stenosed and scope unable to  ?     traverse. Fresh blood was noted around this area after scope contact,  ?     but not actively bleeding. Was difficult to advance to this area again  ?     due to scope retroflexion even with abdominal pressure, Estimated blood  ?     loss: none. ?     The Z-line was regular. Estimated blood loss: none. ?     Esophagogastric landmarks were identified: the gastroesophageal junction  ?     was found at 34 cm from the incisors. ?     The examined esophagus was normal. ?Impression:            - Normal examined jejunum. ?                       - Hematin gastric fluid. ?                       - Z-line regular. ?                       - Esophagogastric landmarks identified. ?                       - Normal esophagus. ?                       - No specimens collected. ?Recommendation:        - Continue present medications. Including iv ppi ?                       - Repeat upper endoscopy if patient continues to have  ?                       issues bleeding. ?                       - Return patient to hospital ward for ongoing care. ?                       -  Clear liquid diet. ?                       - The findings and recommendations were discussed with  ?                       the patient. ?                       - The findings and recommendations were discussed with  ?                       the patient's family. ?                       - Consider UGI with SBFT study to assess for blockage  ?                       if vomiting develops ?Procedure Code(s):     --- Professional --- ?                       [phone], Esophagogastroduodenoscopy, flexible,  ?                       transoral; diagnostic, including collection of  ?                       specimen(s) by brushing or washing, when performed  ?                       (separate procedure) ?Diagnosis Code(s):     --- Professional --- ?                        K92.1, Melena (includes Hematochezia) ?CPT copyright 2019 American Medical Association. All rights reserved. ?The codes documented in this report are preliminary and upon coder review may  ?be revised to meet current compliance requirements. ?Attending Participation: ?     I personally performed the entire procedure. ?Volney American, DO ?Annamaria Helling DO, DO ?05/09/2021 11:36:08 AM ?This report has been signed electronically. ?Number of Addenda: 0 ?Note Initiated On: 05/09/2021 10:05 AM ?Estimated Blood Loss:  Estimated blood loss: none. ?     [facility] ?

## 2021-05-10 LAB — HEMOGLOBIN AND HEMATOCRIT, BLOOD
HCT: 25.8 % — ABNORMAL LOW (ref 36.0–46.0)
Hemoglobin: 8.6 g/dL — ABNORMAL LOW (ref 12.0–15.0)

## 2021-05-10 LAB — HIV ANTIBODY (ROUTINE TESTING W REFLEX): HIV Screen 4th Generation wRfx: NONREACTIVE

## 2021-05-10 NOTE — Progress Notes (Signed)
Progress Note    Tanya Nguyen  QBH:[id number] DOB: 1965/10/17  DOA: 05/08/2021 PCP: Pleas Koch, NP      Brief Narrative:    Medical records reviewed and are as summarized below:  Tanya Nguyen is a 56 y.o. 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  She has also been feeling very weak.  She said she had been using NSAIDs for about 2 weeks now because of dental pain.  She was admitted to the hospital for acute upper GI bleeding.      Assessment/Plan:   Principal Problem:   Melena Active Problems:   NSAID long-term use   Hx of bypass gastrojejunostomy   Migraine   Hypothyroidism   Depression   Body mass index is 23.2 kg/m.   Acute upper GI bleeding: S/p EGD which showed normal esophagus, normal jejunum and Hematin gastric fluid.  Continue IV Protonix.  Discontinue IV fluids.  Avoid NSAIDs.   Acute blood loss anemia: Further drop in H&H noted.  However, there is no indication for blood transfusion at this time.  Repeat CBC tomorrow.    Hypotension: BP stable.  Discontinue IV fluids and monitor BP closely.  Other comorbidities include depression, anxiety, hypothyroidism, migraine, history of bypass gastrojejunostomy    Diet Order             Diet clear liquid Room service appropriate? Yes; Fluid consistency: Thin  Diet effective now                        Consultants: Gastroenterologist  Procedures: EGD    Medications:    DULoxetine  60 mg Oral Daily   pantoprazole (PROTONIX) IV  40 mg Intravenous Once   topiramate  100 mg Oral QHS   traZODone  150 mg Oral QHS   Continuous Infusions:  pantoprazole 8 mg/hr (05/10/21 1052)     Anti-infectives (From admission, onward)    None               Family Communication/Anticipated D/C date and plan/Code Status   DVT prophylaxis: SCDs Start: 05/08/21 2105     Code Status: Full Code  Family Communication: Plan discussed with the hospital over the phone Disposition Plan: Plan to discharge home in 1 to 2 days   Status is: Inpatient Remains inpatient appropriate because: GI bleeding               Subjective:   Interval events noted.  She said she had dark stools this morning.  No vomiting or abdominal pain.  Objective:    Vitals:   05/10/21 0039 05/10/21 0409 05/10/21 0815 05/10/21 1130  BP: 123/63 95/61 (!) 90/50 (!) 100/54  Pulse: 75 83 71 81  Resp: '20 20 18 18  '$ Temp: 97.9 F (36.6 C) 98.4 F (36.9 C) 98 F (36.7 C) 97.8 F (36.6 C)  TempSrc:  Oral    SpO2: 100% 100% 100% 97%  Weight:      Height:       No data found.   Intake/Output Summary (Last 24 hours) at 05/10/2021 1201 Last data filed at 05/10/2021 1129 Gross per 24 hour  Intake 3698.3 ml  Output 0 ml  Net 3698.3 ml   Filed Weights   05/08/21 1315 05/09/21 1219  Weight: 51.3 kg  52.1 kg    Exam:  GEN: NAD SKIN: No rash EYES: EOMI ENT: MMM CV: RRR PULM: CTA B ABD: soft, ND, NT, +BS CNS: AAO x 3, non focal EXT: No edema or tenderness       Data Reviewed:   I have personally reviewed following labs and imaging studies:  Labs: Labs show the following:   Basic Metabolic Panel: Recent Labs  Lab 05/08/21 1321  NA 139  K 3.9  CL 109  CO2 23  GLUCOSE 112*  BUN 33*  CREATININE 0.75  CALCIUM 8.5*   GFR Estimated Creatinine Clearance: 58 mL/min (by C-G formula based on SCr of 0.75 mg/dL). Liver Function Tests: Recent Labs  Lab 05/08/21 1321  AST 21  ALT 12  ALKPHOS 44  BILITOT 0.7  PROT 6.1*  ALBUMIN 3.4*   No results for input(s): LIPASE, AMYLASE in the last 168 hours. No results for input(s): AMMONIA in the last 168 hours. Coagulation profile No results for input(s): INR, PROTIME in the  last 168 hours.  CBC: Recent Labs  Lab 05/08/21 1321 05/09/21 0126 05/09/21 1326 05/10/21 0420  WBC 8.3  --   --   --   HGB 12.3 9.8* 9.8* 8.6*  HCT 38.0 29.9* 29.7* 25.8*  MCV 96.0  --   --   --   PLT 322  --   --   --    Cardiac Enzymes: No results for input(s): CKTOTAL, CKMB, CKMBINDEX, TROPONINI in the last 168 hours. BNP (last 3 results) No results for input(s): PROBNP in the last 8760 hours. CBG: No results for input(s): GLUCAP in the last 168 hours. D-Dimer: No results for input(s): DDIMER in the last 72 hours. Hgb A1c: No results for input(s): HGBA1C in the last 72 hours. Lipid Profile: No results for input(s): CHOL, HDL, LDLCALC, TRIG, CHOLHDL, LDLDIRECT in the last 72 hours. Thyroid function studies: No results for input(s): TSH, T4TOTAL, T3FREE, THYROIDAB in the last 72 hours.  Invalid input(s): FREET3 Anemia work up: No results for input(s): VITAMINB12, FOLATE, FERRITIN, TIBC, IRON, RETICCTPCT in the last 72 hours. Sepsis Labs: Recent Labs  Lab 05/08/21 1321  WBC 8.3    Microbiology Recent Results (from the past 240 hour(s))  Resp Panel by RT-PCR (Flu A&B, Covid) Nasopharyngeal Swab     Status: None   Collection Time: 05/08/21  4:47 PM   Specimen: Nasopharyngeal Swab; Nasopharyngeal(NP) swabs in vial transport medium  Result Value Ref Range Status   SARS Coronavirus 2 by RT PCR NEGATIVE NEGATIVE Final    Comment: (NOTE) SARS-CoV-2 target nucleic acids are NOT DETECTED.  The SARS-CoV-2 RNA is generally detectable in upper respiratory specimens during the acute phase of infection. The lowest concentration of SARS-CoV-2 viral copies this assay can detect is 138 copies/mL. A negative result does not preclude SARS-Cov-2 infection and should not be used as the sole basis for treatment or other patient management decisions. A negative result may occur with  improper specimen collection/handling, submission of specimen other than nasopharyngeal swab,  presence of viral mutation(s) within the areas targeted by this assay, and inadequate number of viral copies(<138 copies/mL). A negative result must be combined with clinical observations, patient history, and epidemiological information. The expected result is Negative.  Fact Sheet for Patients:  EntrepreneurPulse.com.au  Fact Sheet for Healthcare Providers:  IncredibleEmployment.be  This test is no t yet approved or cleared by the [place] FDA and  has been authorized for detection and/or diagnosis of SARS-CoV-2 by FDA under  an Emergency Use Authorization (EUA). This EUA will remain  in effect (meaning this test can be used) for the duration of the COVID-19 declaration under Section 564(b)(1) of the Act, 21 U.S.C.section 360bbb-3(b)(1), unless the authorization is terminated  or revoked sooner.       Influenza A by PCR NEGATIVE NEGATIVE Final   Influenza B by PCR NEGATIVE NEGATIVE Final    Comment: (NOTE) The Xpert Xpress SARS-CoV-2/FLU/RSV plus assay is intended as an aid in the diagnosis of influenza from Nasopharyngeal swab specimens and should not be used as a sole basis for treatment. Nasal washings and aspirates are unacceptable for Xpert Xpress SARS-CoV-2/FLU/RSV testing.  Fact Sheet for Patients: EntrepreneurPulse.com.au  Fact Sheet for Healthcare Providers: IncredibleEmployment.be  This test is not yet approved or cleared by the [place] FDA and has been authorized for detection and/or diagnosis of SARS-CoV-2 by FDA under an Emergency Use Authorization (EUA). This EUA will remain in effect (meaning this test can be used) for the duration of the COVID-19 declaration under Section 564(b)(1) of the Act, 21 U.S.C. section 360bbb-3(b)(1), unless the authorization is terminated or revoked.  Performed at [facility], [address] [place], [place] [address]     Procedures  and diagnostic studies:  No results found.             LOS: 2 days   Tanya Nguyen  [facility]   Pager on [email or web address]. If 7PM-7AM, please contact night-coverage at [email or web address]     05/10/2021, 12:01 PM

## 2021-05-10 NOTE — Plan of Care (Signed)

## 2021-05-10 NOTE — Progress Notes (Signed)
? Inpatient Follow-up/Progress Note ?  ?Patient ID: Tanya Nguyen is a 56 y.o. female. ? ?Overnight Events / Subjective Findings ?NAEON. Hgb 8.6 today, but VSS overall. Reports dark bowel movements o/n, but frequency is slowing down and becoming more formed. Pt tolerating clear liquids. No abdominal discomfort/n/v. Denies cp and sob. Feels her weakness is improving. Otherwise resting comfortably. ? ?Review of Systems  ?Constitutional:  Positive for appetite change (diminished). Negative for activity change, chills, diaphoresis, fatigue, fever and unexpected weight change.  ?HENT:  Negative for trouble swallowing and voice change.   ?Respiratory:  Negative for shortness of breath and wheezing.   ?Cardiovascular:  Negative for chest pain, palpitations and leg swelling.  ?Gastrointestinal:  Positive for blood in stool (melena), diarrhea and nausea. Negative for abdominal distention, abdominal pain, anal bleeding, constipation, rectal pain and vomiting.  ?Musculoskeletal:  Negative for arthralgias and myalgias.  ?Skin:  Positive for color change. Negative for pallor.  ?Neurological:  Positive for weakness (improving). Negative for dizziness and syncope.  ?Psychiatric/Behavioral:  Negative for confusion.   ?All other systems reviewed and are negative.  ? ?Medications ? ?Current Facility-Administered Medications:  ?  acetaminophen (TYLENOL) tablet 650 mg, 650 mg, Oral, Q6H PRN, 650 mg at 05/09/21 1311 **OR** acetaminophen (TYLENOL) suppository 650 mg, 650 mg, Rectal, Q6H PRN, Athena Masse, MD ?  cyclobenzaprine (FLEXERIL) tablet 5 mg, 5 mg, Oral, BID PRN, Athena Masse, MD ?  dextrose 5 %-0.9 % sodium chloride infusion, , Intravenous, Continuous, Athena Masse, MD, Last Rate: 100 mL/hr at 05/10/21 0053, New Bag at 05/10/21 0053 ?  DULoxetine (CYMBALTA) DR capsule 60 mg, 60 mg, Oral, Daily, Athena Masse, MD ?  ondansetron (ZOFRAN) tablet 4 mg, 4 mg, Oral, Q6H PRN **OR** ondansetron (ZOFRAN) injection 4 mg, 4 mg,  Intravenous, Q6H PRN, Athena Masse, MD ?  pantoprazole (PROTONIX) injection 40 mg, 40 mg, Intravenous, Once, Carrie Mew, MD ?  pantoprozole (PROTONIX) 80 mg /NS 100 mL infusion, 8 mg/hr, Intravenous, Continuous, Judd Gaudier V, MD, Last Rate: 10 mL/hr at 05/10/21 0046, 8 mg/hr at 05/10/21 0046 ?  topiramate (TOPAMAX) tablet 100 mg, 100 mg, Oral, QHS, Judd Gaudier V, MD, 100 mg at 05/09/21 2131 ?  traMADol (ULTRAM) tablet 50 mg, 50 mg, Oral, Q6H PRN, Athena Masse, MD, 50 mg at 05/09/21 1624 ?  traZODone (DESYREL) tablet 150 mg, 150 mg, Oral, QHS, Athena Masse, MD, 150 mg at 05/09/21 2130 ? dextrose 5 % and 0.9% NaCl 100 mL/hr at 05/10/21 0053  ? pantoprazole 8 mg/hr (05/10/21 0046)  ?  ?acetaminophen **OR** acetaminophen, cyclobenzaprine, ondansetron **OR** ondansetron (ZOFRAN) IV, traMADol  ? ?Objective  ? ? ?Vitals:  ? 05/09/21 1657 05/09/21 2017 05/10/21 0039 05/10/21 0409  ?BP: 118/69 107/68 123/63 95/61  ?Pulse: 65 74 75 83  ?Resp: '18 15 20 20  '$ ?Temp: 97.7 ?F (36.5 ?C) 98.3 ?F (36.8 ?C) 97.9 ?F (36.6 ?C) 98.4 ?F (36.9 ?C)  ?TempSrc: Oral Oral  Oral  ?SpO2: 100% 100% 100% 100%  ?Weight:      ?Height:      ? ? ? ?Physical Exam ?Vitals and nursing note reviewed.  ?Constitutional:   ?   General: She is not in acute distress. ?   Appearance: Normal appearance. She is not ill-appearing, toxic-appearing or diaphoretic.  ?HENT:  ?   Head: Normocephalic and atraumatic.  ?   Nose: Nose normal.  ?   Mouth/Throat:  ?   Mouth: Mucous membranes are moist.  ?  Pharynx: Oropharynx is clear.  ?Eyes:  ?   General: No scleral icterus. ?   Extraocular Movements: Extraocular movements intact.  ?Cardiovascular:  ?   Rate and Rhythm: Normal rate and regular rhythm.  ?   Heart sounds: Normal heart sounds. No murmur heard. ?  No friction rub. No gallop.  ?Pulmonary:  ?   Effort: Pulmonary effort is normal. No respiratory distress.  ?   Breath sounds: Normal breath sounds. No wheezing, rhonchi or rales.  ?Abdominal:  ?    General: Abdomen is flat. Bowel sounds are normal. There is no distension.  ?   Palpations: Abdomen is soft.  ?   Tenderness: There is no abdominal tenderness. There is no guarding or rebound.  ?Musculoskeletal:  ?   Cervical back: Neck supple.  ?   Right lower leg: No edema.  ?   Left lower leg: No edema.  ?Skin: ?   General: Skin is warm and dry.  ?   Coloration: Skin is not jaundiced or pale.  ?Neurological:  ?   General: No focal deficit present.  ?   Mental Status: She is alert and oriented to person, place, and time. Mental status is at baseline.  ?Psychiatric:     ?   Mood and Affect: Mood normal.     ?   Behavior: Behavior normal.     ?   Thought Content: Thought content normal.     ?   Judgment: Judgment normal.  ? ? ? ?Laboratory Data ?Recent Labs  ?Lab 05/08/21 ?1321 05/09/21 ?0126 05/09/21 ?1326 05/10/21 ?0420  ?WBC 8.3  --   --   --   ?HGB 12.3 9.8* 9.8* 8.6*  ?HCT 38.0 29.9* 29.7* 25.8*  ?PLT 322  --   --   --   ? ?Recent Labs  ?Lab 05/08/21 ?1321  ?NA 139  ?K 3.9  ?CL 109  ?CO2 23  ?BUN 33*  ?CREATININE 0.75  ?CALCIUM 8.5*  ?PROT 6.1*  ?BILITOT 0.7  ?ALKPHOS 44  ?ALT 12  ?AST 21  ?GLUCOSE 112*  ? ?No results for input(s): INR in the last 168 hours. ?  ? ?Imaging Studies: ?No results found. ? ?Assessment:  ?# Melena; suspected ugib ?- heavy nsaid use ?- h/o ulceration ?- s/p gastrojejunostomy in 2019 from previous GOO ?- no h/o liver disease ?- initial hgb 12.3 --> 9.8. After stabilization 8.6 on 3/5 ?-bun/cr 33/0.75 ?- EGD 05/09/21- with hematin/fluid but no fresh blood or source of bleeding discovered. GJ and JJ anastomoses evaluated. Additional orifice noted- anatomy proved difficult to advance the scope to this area. No clear source of bleeding or ulceration. Remnant stomach and small bowel was evacuated as best possible of fluid buildup and hematin ?  ?# Acute blood loss Anemia 2/2 above ?  ?# NSAID abuse ?  ?# GERD- on home ppi ?  ?# Tooth pain ?  ?# Light headedness/dizzy/ha ?- suspect rebound  HA from ibuprofen and new anemia ? ?Plan:  ?EGD on 3/4 as above. No source found, but clearly an upper gi source based on amount of fluid and hematin in gastric lumen. No fresh bleeding ?f hypotension/continued drop in hgb develop- consider CTA or tagged RBC scan to evaluate for continued bleeding. May require repeat EGD or IR/Surgical intervention if positive ?Continue protonix gtt ?Hold dvt ppx ?No nsaids ?Monitor H&H.  Transfusion and resuscitation as per primary team ?Avoid frequent lab draws to prevent lab induced anemia ?Supportive care and antiemetics as per primary  team ?Maintain two sites IV access ?Avoid nsaids ?Monitor for further GIB. ?Pt tolerating clears. May advance diet later today pending pt medical condition and labs ? ?I personally performed the service. ? ?Management of other medical comorbidities as per primary team ? ?Thank you for allowing [place] to participate in this patient's care. Please don't hesitate to call if any questions or concerns arise.  ? ?Annamaria Helling, DO ?[facility] ? ?Portions of the record may have been created with voice recognition software. Occasional wrong-word or 'sound-a-like' substitutions may have occurred due to the inherent limitations of voice recognition software.  Read the chart carefully and recognize, using context, where substitutions may have occurred. ? ?

## 2021-05-11 ENCOUNTER — Encounter: Payer: Self-pay | Admitting: Gastroenterology

## 2021-05-11 LAB — CBC WITH DIFFERENTIAL/PLATELET
Abs Immature Granulocytes: 0.02 10*3/uL (ref 0.00–0.07)
Basophils Absolute: 0.1 10*3/uL (ref 0.0–0.1)
Basophils Relative: 1 %
Eosinophils Absolute: 0.5 10*3/uL (ref 0.0–0.5)
Eosinophils Relative: 8 %
HCT: 25.3 % — ABNORMAL LOW (ref 36.0–46.0)
Hemoglobin: 8.4 g/dL — ABNORMAL LOW (ref 12.0–15.0)
Immature Granulocytes: 0 %
Lymphocytes Relative: 25 %
Lymphs Abs: 1.5 10*3/uL (ref 0.7–4.0)
MCH: 31.2 pg (ref 26.0–34.0)
MCHC: 33.2 g/dL (ref 30.0–36.0)
MCV: 94.1 fL (ref 80.0–100.0)
Monocytes Absolute: 0.4 10*3/uL (ref 0.1–1.0)
Monocytes Relative: 6 %
Neutro Abs: 3.6 10*3/uL (ref 1.7–7.7)
Neutrophils Relative %: 60 %
Platelets: 241 10*3/uL (ref 150–400)
RBC: 2.69 MIL/uL — ABNORMAL LOW (ref 3.87–5.11)
RDW: 13.2 % (ref 11.5–15.5)
WBC: 6 10*3/uL (ref 4.0–10.5)
nRBC: 0 % (ref 0.0–0.2)

## 2021-05-11 MED ORDER — FERROUS SULFATE 325 (65 FE) MG PO TABS: 325.0000 mg | ORAL_TABLET | ORAL | Status: DC

## 2021-05-11 MED ORDER — SODIUM CHLORIDE 0.9 % IV SOLN
200.0000 mg | Freq: Once | INTRAVENOUS | Status: AC
  Administered 2021-05-11: 200 mg via INTRAVENOUS
  Filled 2021-05-11: qty 200

## 2021-05-11 MED ORDER — FERROUS SULFATE 325 (65 FE) MG PO TABS: 325.0000 mg | ORAL_TABLET | ORAL | 0 refills | Status: DC

## 2021-05-11 MED ORDER — OMEPRAZOLE MAGNESIUM 20 MG PO TBEC: 40.0000 mg | DELAYED_RELEASE_TABLET | Freq: Two times a day (BID) | ORAL | 0 refills | Status: DC

## 2021-05-11 NOTE — Discharge Summary (Signed)
Physician Discharge Summary  Tanya Nguyen YCX:[id number] DOB: 1965/06/03 DOA: 05/08/2021  PCP: Pleas Koch, NP  Admit date: 05/08/2021 Discharge date: 05/11/2021  Discharge disposition: Home   Recommendations for Outpatient Follow-Up:   Follow up with PCP in 1 to 2 weeks   Discharge Diagnosis:   Principal Problem:   Melena Active Problems:   NSAID long-term use   Hx of bypass gastrojejunostomy   Migraine   Hypothyroidism   Depression    Discharge Condition: Stable.  Diet recommendation:  Diet Order             DIET SOFT Room service appropriate? Yes; Fluid consistency: Thin  Diet effective now           Diet general                     Code Status: Full Code     Hospital Course:   Tanya Nguyen is a 56 y.o. 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  She has also been feeling very weak.  She said she had been using NSAIDs for about 2 weeks prior to admission because of dental pain.   She was admitted to the hospital for acute upper GI bleeding complicated by acute blood loss anemia and hypotension.  She was treated with IV fluids, IV Protonix and IV iron.  She underwent EGD which showed normal esophagus, normal jejunum and hematin gastric fluid.  Her condition has improved and she is deemed stable for discharge to home today.  Discharge plan was discussed with Dr. Virgina Jock, gastroenterologist, via secure chat and patient is okay for discharge from his standpoint..      Medical Consultants:   Gastroenterologist   Discharge Exam:    Vitals:   05/10/21 2355 05/11/21 0344 05/11/21 0719 05/11/21 1143  BP: 98/61 (!) 100/58 (!) 96/58 96/63  Pulse: 74 77 64 72  Resp: '18 16 17 17  '$ Temp: 98.1 F (36.7 C) 97.6 F (36.4  C) 98.4 F (36.9 C) 97.8 F (36.6 C)  TempSrc:      SpO2: 97% 97% 98% 100%  Weight:      Height:         GEN: NAD SKIN: Warm and dry EYES: No pallor or icterus ENT: MMM CV: RRR PULM: CTA B ABD: soft, ND, NT, +BS CNS: AAO x 3, non focal EXT: No edema or tenderness   The results of significant diagnostics from this hospitalization (including imaging, microbiology, ancillary and laboratory) are listed below for reference.     Procedures and Diagnostic Studies:   No results found.   Labs:   Basic Metabolic Panel: Recent Labs  Lab 05/08/21 1321  NA 139  K 3.9  CL 109  CO2 23  GLUCOSE 112*  BUN 33*  CREATININE 0.75  CALCIUM 8.5*   GFR Estimated Creatinine Clearance: 58 mL/min (by C-G formula based on SCr of 0.75 mg/dL). Liver Function Tests: Recent Labs  Lab 05/08/21 1321  AST 21  ALT 12  ALKPHOS 44  BILITOT 0.7  PROT 6.1*  ALBUMIN 3.4*   No results for input(s): LIPASE, AMYLASE in the last 168 hours. No results for input(s): AMMONIA in the last 168 hours. Coagulation profile No results for input(s): INR, PROTIME in the last 168 hours.  CBC: Recent Labs  Lab  05/08/21 1321 05/09/21 0126 05/09/21 1326 05/10/21 0420 05/11/21 0458  WBC 8.3  --   --   --  6.0  NEUTROABS  --   --   --   --  3.6  HGB 12.3 9.8* 9.8* 8.6* 8.4*  HCT 38.0 29.9* 29.7* 25.8* 25.3*  MCV 96.0  --   --   --  94.1  PLT 322  --   --   --  241   Cardiac Enzymes: No results for input(s): CKTOTAL, CKMB, CKMBINDEX, TROPONINI in the last 168 hours. BNP: Invalid input(s): POCBNP CBG: No results for input(s): GLUCAP in the last 168 hours. D-Dimer No results for input(s): DDIMER in the last 72 hours. Hgb A1c No results for input(s): HGBA1C in the last 72 hours. Lipid Profile No results for input(s): CHOL, HDL, LDLCALC, TRIG, CHOLHDL, LDLDIRECT in the last 72 hours. Thyroid function studies No results for input(s): TSH, T4TOTAL, T3FREE, THYROIDAB in the last 72  hours.  Invalid input(s): FREET3 Anemia work up No results for input(s): VITAMINB12, FOLATE, FERRITIN, TIBC, IRON, RETICCTPCT in the last 72 hours. Microbiology Recent Results (from the past 240 hour(s))  Resp Panel by RT-PCR (Flu A&B, Covid) Nasopharyngeal Swab     Status: None   Collection Time: 05/08/21  4:47 PM   Specimen: Nasopharyngeal Swab; Nasopharyngeal(NP) swabs in vial transport medium  Result Value Ref Range Status   SARS Coronavirus 2 by RT PCR NEGATIVE NEGATIVE Final    Comment: (NOTE) SARS-CoV-2 target nucleic acids are NOT DETECTED.  The SARS-CoV-2 RNA is generally detectable in upper respiratory specimens during the acute phase of infection. The lowest concentration of SARS-CoV-2 viral copies this assay can detect is 138 copies/mL. A negative result does not preclude SARS-Cov-2 infection and should not be used as the sole basis for treatment or other patient management decisions. A negative result may occur with  improper specimen collection/handling, submission of specimen other than nasopharyngeal swab, presence of viral mutation(s) within the areas targeted by this assay, and inadequate number of viral copies(<138 copies/mL). A negative result must be combined with clinical observations, patient history, and epidemiological information. The expected result is Negative.  Fact Sheet for Patients:  EntrepreneurPulse.com.au  Fact Sheet for Healthcare Providers:  IncredibleEmployment.be  This test is no t yet approved or cleared by the [place] FDA and  has been authorized for detection and/or diagnosis of SARS-CoV-2 by FDA under an Emergency Use Authorization (EUA). This EUA will remain  in effect (meaning this test can be used) for the duration of the COVID-19 declaration under Section 564(b)(1) of the Act, 21 U.S.C.section 360bbb-3(b)(1), unless the authorization is terminated  or revoked sooner.       Influenza A  by PCR NEGATIVE NEGATIVE Final   Influenza B by PCR NEGATIVE NEGATIVE Final    Comment: (NOTE) The Xpert Xpress SARS-CoV-2/FLU/RSV plus assay is intended as an aid in the diagnosis of influenza from Nasopharyngeal swab specimens and should not be used as a sole basis for treatment. Nasal washings and aspirates are unacceptable for Xpert Xpress SARS-CoV-2/FLU/RSV testing.  Fact Sheet for Patients: EntrepreneurPulse.com.au  Fact Sheet for Healthcare Providers: IncredibleEmployment.be  This test is not yet approved or cleared by the [place] FDA and has been authorized for detection and/or diagnosis of SARS-CoV-2 by FDA under an Emergency Use Authorization (EUA). This EUA will remain in effect (meaning this test can be used) for the duration of the COVID-19 declaration under Section 564(b)(1) of the Act, 21 U.S.C. section  360bbb-3(b)(1), unless the authorization is terminated or revoked.  Performed at [facility], [address] [place], [place] [address]      Discharge Instructions:   Discharge Instructions     Diet general   Complete by: As directed    Discharge instructions   Complete by: As directed    Avoid NSAIDs. Break omeprazole capsules open and take with apple sauce   Increase activity slowly   Complete by: As directed       Allergies as of 05/11/2021       Reactions   Penicillins Shortness Of Breath, Diarrhea, Nausea And Vomiting   Has patient had a PCN reaction causing immediate rash, facial/tongue/throat swelling, SOB or lightheadedness with hypotension: yes Has patient had a PCN reaction causing severe rash involving mucus membranes or skin necrosis: no Has patient had a PCN reaction that required hospitalization no Has patient had a PCN reaction occurring within the last 10 years: about 10 years If all of the above answers are "NO", then may proceed with Cephalosporin use.   Gabapentin Other (See  Comments)   Hair Loss   Pregabalin    Tremors   Sulfa Antibiotics Nausea And Vomiting        Medication List     STOP taking these medications    pantoprazole 40 MG tablet Commonly known as: PROTONIX       TAKE these medications    cetirizine 10 MG tablet Commonly known as: ZYRTEC TAKE 1 TABLET BY MOUTH EVERY DAY AS NEEDED FOR ALLERGY   cyclobenzaprine 5 MG tablet Commonly known as: FLEXERIL Take 1 tablet (5 mg total) by mouth 2 (two) times daily as needed for muscle spasms (migraine (sedation precautions)).   DULoxetine 60 MG capsule Commonly known as: Cymbalta Take 1 capsule (60 mg total) by mouth daily. For anxiety and pain.   DULoxetine 30 MG capsule Commonly known as: Cymbalta Take 1 capsule (30 mg total) by mouth daily. For anxiety and pain. Take with 60 mg.   ferrous sulfate 325 (65 FE) MG tablet Take 1 tablet (325 mg total) by mouth every other day. Start taking on: May 12, 2021   fluticasone 50 MCG/ACT nasal spray Commonly known as: FLONASE Place 1 spray into both nostrils 2 (two) times daily. What changed:  when to take this reasons to take this   levothyroxine 88 MCG tablet Commonly known as: SYNTHROID TAKE 1 TABLET BY MOUTH EVERY MORNING ON EMPTY STOMACH WITH WATER ONLY-NO FOOD OR OTHER MED FOR 30MIN   omeprazole 20 MG tablet Commonly known as: PriLOSEC OTC Take 2 tablets (40 mg total) by mouth 2 (two) times daily.   ondansetron 4 MG disintegrating tablet Commonly known as: ZOFRAN-ODT Take 1 tablet (4 mg total) by mouth every 8 (eight) hours as needed for nausea.   oxybutynin 5 MG 24 hr tablet Commonly known as: DITROPAN-XL TAKE 1 TABLET (5 MG TOTAL) BY MOUTH AT BEDTIME. FOR BLADDER INCONTINENCE.   SUMAtriptan 50 MG tablet Commonly known as: IMITREX Take 1 tablet by mouth at migraine onset. May repeat with another tablet 2 hours later if needed.   topiramate 100 MG tablet Commonly known as: TOPAMAX TAKE 1 TABLET (100 MG TOTAL) BY  MOUTH AT BEDTIME. FOR MIGRAINE PREVENTION.   traMADol 50 MG tablet Commonly known as: ULTRAM Take 50 mg by mouth every 8 (eight) hours as needed.   traZODone 50 MG tablet Commonly known as: DESYREL Take 3 tablets (150 mg total) by mouth at bedtime.  For sleep.           If you experience worsening of your admission symptoms, develop shortness of breath, life threatening emergency, suicidal or homicidal thoughts you must seek medical attention immediately by calling 911 or calling your MD immediately  if symptoms less severe.   You must read complete instructions/literature along with all the possible adverse reactions/side effects for all the medicines you take and that have been prescribed to you. Take any new medicines after you have completely understood and accept all the possible adverse reactions/side effects.    Please note   You were cared for by a hospitalist during your hospital stay. If you have any questions about your discharge medications or the care you received while you were in the hospital after you are discharged, you can call the unit and asked to speak with the hospitalist on call if the hospitalist that took care of you is not available. Once you are discharged, your primary care physician will handle any further medical issues. Please note that NO REFILLS for any discharge medications will be authorized once you are discharged, as it is imperative that you return to your primary care physician (or establish a relationship with a primary care physician if you do not have one) for your aftercare needs so that they can reassess your need for medications and monitor your lab values.       Time coordinating discharge: Greater than 30 minutes  Signed:  Seraphine Gudiel  [facility] 05/11/2021, 1:44 PM   Pager on [email or web address]. If 7PM-7AM, please contact night-coverage at [email or web address]

## 2021-05-11 NOTE — Plan of Care (Signed)

## 2021-05-11 NOTE — Progress Notes (Signed)
? ?GI Inpatient Follow-up Note ? ?Subjective: ? ?Patient seen in follow-up for UGIB. No acute events overnight. She has been tolerating clear liquid diet without any issues. She reports she had small volume dark bowel movement this morning, but did not see any tarry-appearing stool. She denies any frank hematochezia. She denies abdominal pain, nausea, vomiting, fevers, or altered mental status. She continues to feel like weakness is improving. She is otherwise without any acute complaints or concerns.  ? ?Scheduled Inpatient Medications:  ? DULoxetine  60 mg Oral Daily  ? pantoprazole (PROTONIX) IV  40 mg Intravenous Once  ? topiramate  100 mg Oral QHS  ? traZODone  150 mg Oral QHS  ? ? ?Continuous Inpatient Infusions: ?  ? pantoprazole 8 mg/hr (05/11/21 0356)  ? ? ?PRN Inpatient Medications:  ?acetaminophen **OR** acetaminophen, cyclobenzaprine, ondansetron **OR** ondansetron (ZOFRAN) IV, traMADol ? ?Review of Systems: ?Constitutional: Weight is stable.  ?Eyes: No changes in vision. ?ENT: No oral lesions, sore throat.  ?GI: see HPI.  ?Heme/Lymph: No easy bruising.  ?CV: No chest pain.  ?GU: No hematuria.  ?Integumentary: No rashes.  ?Neuro: No headaches.  ?Psych: No depression/anxiety.  ?Endocrine: No heat/cold intolerance.  ?Allergic/Immunologic: No urticaria.  ?Resp: No cough, SOB.  ?Musculoskeletal: No joint swelling.  ?  ?Physical Examination: ?BP (!) 96/58 (BP Location: Right Arm)   Pulse 64   Temp 98.4 ?F (36.9 ?C)   Resp 17   Ht '4\' 11"'$  (1.499 m)   Wt 52.1 kg   SpO2 98%   BMI 23.20 kg/m?  ?Gen: NAD, alert and oriented x 4 ?HEENT: PEERLA, EOMI, ?Neck: supple, no JVD or thyromegaly ?Chest: CTA bilaterally, no wheezes, crackles, or other adventitious sounds ?CV: RRR, no m/g/c/r ?Abd: soft, NT, ND, +BS in all four quadrants; no HSM, guarding, ridigity, or rebound tenderness ?Ext: no edema, well perfused with 2+ pulses, ?Skin: no rash or lesions noted ?Lymph: no LAD ? ?Data: ?Lab Results  ?Component Value  Date  ? WBC 6.0 05/11/2021  ? HGB 8.4 (L) 05/11/2021  ? HCT 25.3 (L) 05/11/2021  ? MCV 94.1 05/11/2021  ? PLT 241 05/11/2021  ? ?Recent Labs  ?Lab 05/09/21 ?1326 05/10/21 ?0420 05/11/21 ?0458  ?HGB 9.8* 8.6* 8.4*  ? ?Lab Results  ?Component Value Date  ? NA 139 05/08/2021  ? K 3.9 05/08/2021  ? CL 109 05/08/2021  ? CO2 23 05/08/2021  ? BUN 33 (H) 05/08/2021  ? CREATININE 0.75 05/08/2021  ? ?Lab Results  ?Component Value Date  ? ALT 12 05/08/2021  ? AST 21 05/08/2021  ? ALKPHOS 44 05/08/2021  ? BILITOT 0.7 05/08/2021  ? ?No results for input(s): APTT, INR, PTT in the last 168 hours. ? ?Assessment/Plan: ? ?Melena - suspected UGIB in setting of overuse of NSAIDs. She is s/p EGD 3/4 over the weekend with Dr. Virgina Jock with hematin/fluid but no fresh blood or source of bleeding identified. Past surgical history made visualization difficult.  ? ?Acute blood loss anemia - 2/2 above ? ?Overuse of NSAIDs ? ?GERD - on home PPI ? ?Tooth pain ? ?Lightheadedness/headache/dizziness - suspect rebound HA from NSAIDs ? ?Recommendations: ? ?- H&H stable this morning at 8.4 with no signs of recurrent melena or GI bleeding ?- Continue to monitor serial H&H. Transfuse for Hgb <7.0. ?- D/C Protonix gtt and can change to oral PPI capsules BID with applesauce ?- Counseled patient on importance of cessation of NSAIDs ?- Continue supportive care with antiemetics as needed ?- Continue to monitor for  signs and symptoms of bleeding ?- Avoid NSAIDs ?- Will advance diet today and continue to advance as tolerated ?- If she develops signs and symptoms of GI bleeding, consider CTA or tagged RBC scan to evaluate for active bleeding.  ?- Call Dr. Virgina Jock if there are any concerns or questions ?- Following along with you  ? ?Please call with questions or concerns. ? ? ? ?Octavia Bruckner, PA-C ?[facility] ?[phone] ?[phone] (Cell) ? ? ? ?

## 2021-05-11 NOTE — Progress Notes (Signed)
?  Transition of Care (TOC) Screening Note ? ? ?Patient Details  ?Name: Tanya Nguyen ?Date of Birth: 01-11-1966 ? ? ?Transition of Care (TOC) CM/SW Contact:    ?Alberteen Sam, LCSW ?Phone Number: ?05/11/2021, 8:37 AM ? ? ? ?Transition of Care Department [facility]) has reviewed patient and no TOC needs have been identified at this time. We will continue to monitor patient advancement through interdisciplinary progression rounds. If new patient transition needs arise, please place a TOC consult. ? Pricilla Riffle, [place] ?[phone] ? ?

## 2021-05-12 ENCOUNTER — Telehealth: Payer: Self-pay

## 2021-05-12 NOTE — Telephone Encounter (Addendum)
Transition Care Management Unsuccessful Follow-up Telephone Call ? ?Date of discharge and from where:  [place] 05-11-21 Dx: melena ? ?Attempts:  1st Attempt ? ?Reason for unsuccessful TCM follow-up call:  Left voice message ? ?Transition Care Management Follow-up Telephone Call ?Date of discharge and from where: [place] 05-11-21 Dx: melena ?How have you been since you were released from the hospital? Weak but ok ?Any questions or concerns? No ? ?Items Reviewed: ?Did the pt receive and understand the discharge instructions provided? Yes  ?Medications obtained and verified? Yes  ?Other? No  ?Any new allergies since your discharge? No  ?Dietary orders reviewed? Yes ?Do you have support at home? Yes  ? ?Home Care and Equipment/Supplies: ?Were home health services ordered? no ?If so, what is the name of the agency? na  ?Has the agency set up a time to come to the patient's home? not applicable ?Were any new equipment or medical supplies ordered?  No ?What is the name of the medical supply agency? na ?Were you able to get the supplies/equipment? no ?Do you have any questions related to the use of the equipment or supplies? No ? ?Functional Questionnaire: (I = Independent and D = Dependent) ?ADLs: I ? ?Bathing/Dressing- I ? ?Meal Prep- I ? ?Eating- I ? ?Maintaining continence- I ? ?Transferring/Ambulation- I ? ?Managing Meds- I ? ?Follow up appointments reviewed: ? ?PCP Hospital f/u appt confirmed? Yes  Scheduled to see Karl Ito NP on 05-18-21 @ 1020am ?[facility] f/u appt confirmed? No . ?Are transportation arrangements needed? No  ?If their condition worsens, is the pt aware to call PCP or go to the Emergency Dept.? Yes ?Was the patient provided with contact information for the PCP's office or ED? Yes ?Was to pt encouraged to call back with questions or concerns? Yes  ? ?  ?

## 2021-05-16 ENCOUNTER — Other Ambulatory Visit: Payer: Self-pay | Admitting: Primary Care

## 2021-05-16 DIAGNOSIS — G43709 Chronic migraine without aura, not intractable, without status migrainosus: Secondary | ICD-10-CM

## 2021-05-16 DIAGNOSIS — G609 Hereditary and idiopathic neuropathy, unspecified: Secondary | ICD-10-CM

## 2021-05-18 ENCOUNTER — Other Ambulatory Visit: Payer: Self-pay

## 2021-05-18 ENCOUNTER — Ambulatory Visit (INDEPENDENT_AMBULATORY_CARE_PROVIDER_SITE_OTHER): Payer: 59 | Admitting: Nurse Practitioner

## 2021-05-18 ENCOUNTER — Encounter: Payer: Self-pay | Admitting: Nurse Practitioner

## 2021-05-18 VITALS — BP 104/72 | HR 100 | Temp 97.3°F | Resp 12 | Ht 59.0 in | Wt 116.2 lb

## 2021-05-18 DIAGNOSIS — K922 Gastrointestinal hemorrhage, unspecified: Secondary | ICD-10-CM

## 2021-05-18 DIAGNOSIS — D649 Anemia, unspecified: Secondary | ICD-10-CM

## 2021-05-18 DIAGNOSIS — D509 Iron deficiency anemia, unspecified: Secondary | ICD-10-CM | POA: Insufficient documentation

## 2021-05-18 HISTORY — DX: Gastrointestinal hemorrhage, unspecified: K92.2

## 2021-05-18 LAB — BASIC METABOLIC PANEL
BUN: 12 mg/dL (ref 6–23)
CO2: 24 mEq/L (ref 19–32)
Calcium: 8 mg/dL — ABNORMAL LOW (ref 8.4–10.5)
Chloride: 110 mEq/L (ref 96–112)
Creatinine, Ser: 0.85 mg/dL (ref 0.40–1.20)
GFR: 76.47 mL/min (ref >60.00)
Glucose, Bld: 94 mg/dL (ref 70–99)
Potassium: 3.7 mEq/L (ref 3.5–5.1)
Sodium: 141 mEq/L (ref 135–145)

## 2021-05-18 LAB — CBC
HCT: 30.2 % — ABNORMAL LOW (ref 36.0–46.0)
Hemoglobin: 10 g/dL — ABNORMAL LOW (ref 12.0–15.0)
MCHC: 33 g/dL (ref 30.0–36.0)
MCV: 95.7 fl (ref 78.0–100.0)
Platelets: 339 10*3/uL (ref 150.0–400.0)
RBC: 3.16 Mil/uL — ABNORMAL LOW (ref 3.87–5.11)
RDW: 13.9 % (ref 11.5–15.5)
WBC: 5.3 10*3/uL (ref 4.0–10.5)

## 2021-05-18 MED ORDER — PANTOPRAZOLE SODIUM 40 MG PO TBEC: 40.0000 mg | DELAYED_RELEASE_TABLET | Freq: Every day | ORAL | 0 refills | Status: DC

## 2021-05-18 NOTE — Patient Instructions (Signed)
Continue taking the iron pill (ferrous sulfate) every other day ?You can use a stool softener daily or every other day to keep your stool soft so you don't have to strain or get constipated on the iron medicine ?Continue using the pantoprazole for now until I hear from the GI group. I will call you with an answer once I have it. ?I will be in touch with the labs once I have them ?Follow up as needed/scheduled, sooner if needed ? ?You know this already but AVOID ALL NSAID medications ?

## 2021-05-18 NOTE — Assessment & Plan Note (Signed)
Secondary to gastric bleed from overuse of NSAID medications.  Continue taking ferrous sulfate as prescribed from inpatient providers.  Pending lab results today ?

## 2021-05-18 NOTE — Progress Notes (Signed)
Established Patient Office Visit  Subjective:  Patient ID: Tanya Nguyen, female    DOB: 03-27-1965  Age: 56 y.o. MRN: [id number]  CC:  Chief Complaint  Patient presents with   Hospitalization Follow-up    Still feeling a little weak    HPI Tanya Nguyen presents for Hosptial follow up   States that on 05/08/2021 she presented to ED [facility]) for hematemesis and melena. She has a history of gastric surgery due to ulcers (likely to NSAID use as she states she use to use BC powders) was on PPI at the time. States that she was seen by a dentist for dental pain and was written ibuprofen for the pain.   She was evaluated and admitted to the hospital. GI was consulted. They did an EGD and did not show an active bleed or ulcer. THey discontinued the NSAIDs and placed her on omeprazole '40mg'$  BID. Patient went to pick up medication but insurance would not cover, she cannot afford that dosing OTC. States she has been taking her Protonix '40mg'$  instead    Has a GI provider Dr. [name].   Past Medical History:  Diagnosis Date   Acquired pyloric stricture    Bleeding duodenal ulcer    Chronic esophagogastric ulcer    Depression    Duodenal obstruction    Gastric outlet obstruction 01/19/2018   GERD (gastroesophageal reflux disease)    Hair loss 09/22/2018   History of stomach ulcers    Hypothyroidism    Incisional hernia, without obstruction or gangrene    Intractable vomiting    Kidney stones    Migraine    Migraine headache    only1-2x/month since starting topamax   Neuropathy    Side pain 03/30/2018    Past Surgical History:  Procedure Laterality Date   ABDOMINAL HYSTERECTOMY  2004   partial   CHOLECYSTECTOMY     COLONOSCOPY WITH PROPOFOL N/A 02/27/2018   Procedure: COLONOSCOPY WITH PROPOFOL;  Surgeon: [name], MD;  Location: [place];  Service: Endoscopy;  Laterality: N/A;   ESOPHAGOGASTRODUODENOSCOPY (EGD) WITH PROPOFOL N/A 01/12/2018   Procedure:  ESOPHAGOGASTRODUODENOSCOPY (EGD) WITH BIOPSIES;  Surgeon: [name], MD;  Location: [place];  Service: Endoscopy;  Laterality: N/A;   ESOPHAGOGASTRODUODENOSCOPY (EGD) WITH PROPOFOL N/A 01/25/2018   Procedure: ESOPHAGOGASTRODUODENOSCOPY (EGD) WITH PROPOFOL;  Surgeon: Jonathon Bellows, MD;  Location: [facility] ENDOSCOPY;  Service: Gastroenterology;  Laterality: N/A;   ESOPHAGOGASTRODUODENOSCOPY (EGD) WITH PROPOFOL N/A 02/27/2018   Procedure: ESOPHAGOGASTRODUODENOSCOPY (EGD) WITH PROPOFOL;  Surgeon: [name], MD;  Location: [place];  Service: Endoscopy;  Laterality: N/A;   ESOPHAGOGASTRODUODENOSCOPY (EGD) WITH PROPOFOL N/A 05/09/2021   Procedure: ESOPHAGOGASTRODUODENOSCOPY (EGD) WITH PROPOFOL;  Surgeon: Annamaria Helling, DO;  Location: [place];  Service: Gastroenterology;  Laterality: N/A;   INCISIONAL HERNIA REPAIR N/A 11/23/2018   Procedure: HERNIA REPAIR INCISIONAL;  Surgeon: Olean Ree, MD;  Location: [facility];  Service: General;  Laterality: N/A;   INSERTION OF MESH N/A 11/23/2018   Procedure: INSERTION OF MESH;  Surgeon: Olean Ree, MD;  Location: [facility];  Service: General;  Laterality: N/A;   LAPAROTOMY N/A 01/20/2018   Procedure: EXPLORATORY LAPAROTOMY;  Surgeon: Olean Ree, MD;  Location: [facility];  Service: General;  Laterality: N/A;   TOOTH EXTRACTION      Family History  Problem Relation Age of Onset   Cancer Mother    Breast cancer Mother 99   Lung cancer Father    Alcohol abuse Sister    Bipolar disorder  Sister    Alcohol abuse Maternal Grandmother    Stroke Maternal Grandmother    Stroke Paternal Grandmother     Social History   Socioeconomic History   Marital status: Married    Spouse name: Not on file   Number of children: 0   Years of education: 12   Highest education level: High school graduate  Occupational History   Occupation: Homemaker  Tobacco Use   Smoking status: Never   Smokeless tobacco: Never  Vaping Use    Vaping Use: Never used  Substance and Sexual Activity   Alcohol use: Not Currently    Alcohol/week: 0.0 standard drinks   Drug use: No   Sexual activity: Yes    Birth control/protection: None  Other Topics Concern   Not on file  Social History Narrative   Lives at home with her husband.   Right-handed.   Rare caffeine.   Social Determinants of Health   Financial Resource Strain: Not on file  Food Insecurity: Not on file  Transportation Needs: Not on file  Physical Activity: Not on file  Stress: Not on file  Social Connections: Not on file  Intimate Partner Violence: Not on file    Outpatient Medications Prior to Visit  Medication Sig Dispense Refill   cetirizine (ZYRTEC) 10 MG tablet TAKE 1 TABLET BY MOUTH EVERY DAY AS NEEDED FOR ALLERGY 90 tablet 1   cyclobenzaprine (FLEXERIL) 5 MG tablet Take 1 tablet (5 mg total) by mouth 2 (two) times daily as needed (migraines). 30 tablet 0   DULoxetine (CYMBALTA) 60 MG capsule Take 1 capsule (60 mg total) by mouth daily. For anxiety and pain. 90 capsule 1   ferrous sulfate 325 (65 FE) MG tablet Take 1 tablet (325 mg total) by mouth every other day. 30 tablet 0   fluticasone (FLONASE) 50 MCG/ACT nasal spray Place 1 spray into both nostrils 2 (two) times daily. (Patient taking differently: Place 1 spray into both nostrils 2 (two) times daily as needed for allergies or rhinitis.) 16 g 0   levothyroxine (SYNTHROID) 88 MCG tablet TAKE 1 TABLET BY MOUTH EVERY MORNING ON EMPTY STOMACH WITH WATER ONLY-NO FOOD OR OTHER MED FOR 30MIN 90 tablet 1   ondansetron (ZOFRAN-ODT) 4 MG disintegrating tablet Take 1 tablet (4 mg total) by mouth every 8 (eight) hours as needed for nausea. 20 tablet 0   oxybutynin (DITROPAN-XL) 5 MG 24 hr tablet TAKE 1 TABLET (5 MG TOTAL) BY MOUTH AT BEDTIME. FOR BLADDER INCONTINENCE. 90 tablet 2   SUMAtriptan (IMITREX) 50 MG tablet Take 1 tablet by mouth at migraine onset. May repeat with another tablet 2 hours later if needed. 9  tablet 0   topiramate (TOPAMAX) 100 MG tablet TAKE 1 TABLET (100 MG TOTAL) BY MOUTH AT BEDTIME. FOR MIGRAINE PREVENTION. 90 tablet 2   traMADol (ULTRAM) 50 MG tablet Take 50 mg by mouth every 8 (eight) hours as needed.     traZODone (DESYREL) 50 MG tablet Take 3 tablets (150 mg total) by mouth at bedtime. For sleep. 270 tablet 2   ibuprofen (ADVIL) 600 MG tablet Take 600 mg by mouth every 6 (six) hours as needed.     omeprazole (PRILOSEC OTC) 20 MG tablet Take 2 tablets (40 mg total) by mouth 2 (two) times daily. (Patient not taking: Reported on 05/13/2021) 60 tablet 0   No facility-administered medications prior to visit.    Allergies  Allergen Reactions   Penicillins Shortness Of Breath, Diarrhea and Nausea And Vomiting  Has patient had a PCN reaction causing immediate rash, facial/tongue/throat swelling, SOB or lightheadedness with hypotension: yes Has patient had a PCN reaction causing severe rash involving mucus membranes or skin necrosis: no Has patient had a PCN reaction that required hospitalization no Has patient had a PCN reaction occurring within the last 10 years: about 10 years If all of the above answers are "NO", then may proceed with Cephalosporin use.    Gabapentin Other (See Comments)    Hair Loss    Pregabalin     Tremors   Sulfa Antibiotics Nausea And Vomiting    ROS Review of Systems  Constitutional:  Negative for chills and fever.  Gastrointestinal:  Negative for abdominal pain, blood in stool, constipation, diarrhea, nausea and vomiting.  Genitourinary:  Negative for difficulty urinating.  Neurological:  Positive for headaches (intermittent). Negative for dizziness and light-headedness.     Objective:    Physical Exam Vitals and nursing note reviewed.  Constitutional:      Appearance: Normal appearance.  HENT:     Mouth/Throat:     Mouth: Mucous membranes are moist.     Pharynx: Oropharynx is clear.  Cardiovascular:     Rate and Rhythm: Normal rate  and regular rhythm.  Pulmonary:     Effort: Pulmonary effort is normal.     Breath sounds: Normal breath sounds.  Abdominal:     General: Bowel sounds are normal. There is no distension.     Palpations: Abdomen is soft. There is no mass.     Tenderness: There is no abdominal tenderness.     Hernia: No hernia is present.  Skin:    General: Skin is warm.     Coloration: Skin is not pale.  Neurological:     Mental Status: She is alert.    BP 104/72    Pulse 100    Temp (!) 97.3 F (36.3 C)    Resp 12    Ht '4\' 11"'$  (1.499 m)    Wt 116 lb 4 oz (52.7 kg)    SpO2 97%    BMI 23.48 kg/m  Wt Readings from Last 3 Encounters:  05/18/21 116 lb 4 oz (52.7 kg)  05/09/21 114 lb 13.8 oz (52.1 kg)  04/29/21 119 lb (54 kg)     There are no preventive care reminders to display for this patient.  There are no preventive care reminders to display for this patient.  Lab Results  Component Value Date   TSH 2.09 07/16/2020   Lab Results  Component Value Date   WBC 6.0 05/11/2021   HGB 8.4 (L) 05/11/2021   HCT 25.3 (L) 05/11/2021   MCV 94.1 05/11/2021   PLT 241 05/11/2021   Lab Results  Component Value Date   NA 139 05/08/2021   K 3.9 05/08/2021   CO2 23 05/08/2021   GLUCOSE 112 (H) 05/08/2021   BUN 33 (H) 05/08/2021   CREATININE 0.75 05/08/2021   BILITOT 0.7 05/08/2021   ALKPHOS 44 05/08/2021   AST 21 05/08/2021   ALT 12 05/08/2021   PROT 6.1 (L) 05/08/2021   ALBUMIN 3.4 (L) 05/08/2021   CALCIUM 8.5 (L) 05/08/2021   ANIONGAP 7 05/08/2021   GFR 82.72 07/16/2020   Lab Results  Component Value Date   CHOL 183 11/05/2020   Lab Results  Component Value Date   HDL 59.20 11/05/2020   Lab Results  Component Value Date   LDLCALC 98 11/05/2020   Lab Results  Component Value Date  TRIG 130.0 11/05/2020   Lab Results  Component Value Date   CHOLHDL 3 11/05/2020   Lab Results  Component Value Date   HGBA1C 5.9 11/05/2020      Assessment & Plan:   Problem List Items  Addressed This Visit       Digestive   Upper GI bleed - Primary    Presumed upper GI bleed.  Patient was discharged from hospital hemodynamically stable.  Repeatedly reviewed with patient about avoiding all NSAID medications.  Patient acknowledged.  She was unable to afford the omeprazole over-the-counter dosing inpatient providers longer.  Did reach out to the [place] clinic and verify that Protonix is an okay substitute, they agreed as long as she is on a PPI the recommended 40 mg of Protonix.  Patient currently on Protonix 40 mg previously.  Patient also started on iron pills did discuss those the likelihood of constipation she has stool softeners at home.  States she continue to take it daily or every other day gauging on how her stools are.  Recheck CBC and BMP today pending results.      Relevant Orders   CBC   Basic metabolic panel     Other   Low hemoglobin    Secondary to gastric bleed from overuse of NSAID medications.  Continue taking ferrous sulfate as prescribed from inpatient providers.  Pending lab results today      Relevant Orders   CBC    Meds ordered this encounter  Medications   pantoprazole (PROTONIX) 40 MG tablet    Sig: Take 1 tablet (40 mg total) by mouth daily.    Dispense:  30 tablet    Refill:  0    Order Specific Question:   Supervising Provider    Answer:   Loura Pardon A [[id number]]    Follow-up: Return for f/u as scheduled sooner if needed.   This visit occurred during the SARS-CoV-2 public health emergency.  Safety protocols were in place, including screening questions prior to the visit, additional usage of staff PPE, and extensive cleaning of exam room while observing appropriate contact time as indicated for disinfecting solutions.   Romilda Garret, NP

## 2021-05-18 NOTE — Assessment & Plan Note (Signed)
Presumed upper GI bleed.  Patient was discharged from hospital hemodynamically stable.  Repeatedly reviewed with patient about avoiding all NSAID medications.  Patient acknowledged.  She was unable to afford the omeprazole over-the-counter dosing inpatient providers longer.  Did reach out to the [place] clinic and verify that Protonix is an okay substitute, they agreed as long as she is on a PPI the recommended 40 mg of Protonix.  Patient currently on Protonix 40 mg previously.  Patient also started on iron pills did discuss those the likelihood of constipation she has stool softeners at home.  States she continue to take it daily or every other day gauging on how her stools are.  Recheck CBC and BMP today pending results. ?

## 2021-05-19 ENCOUNTER — Encounter: Payer: Self-pay | Admitting: *Deleted

## 2021-05-19 ENCOUNTER — Telehealth: Payer: Self-pay | Admitting: Podiatry

## 2021-05-19 NOTE — Telephone Encounter (Signed)
Pt calling for the results from the nerve biopsy that was done.  ? ?

## 2021-05-19 NOTE — Telephone Encounter (Signed)
Notified patient via voice mail that report came in today and waiting on Dr. Amalia Hailey to review and call patient back with results ?

## 2021-05-21 ENCOUNTER — Telehealth: Payer: Self-pay | Admitting: *Deleted

## 2021-05-21 NOTE — Telephone Encounter (Signed)
Tried calling and went to voicemail. Will try again tomorrow. - Dr. Amalia Hailey

## 2021-05-21 NOTE — Telephone Encounter (Signed)
"  Dr. Amalia Hailey ran some test on me.  He hasn't called me with the results."  I'll send he and Angie a message regarding your call." ?

## 2021-05-22 NOTE — Progress Notes (Signed)
Spoke with patient via telephone and reviewed results of the nerve density biopsies that were performed b/l. WNL. Recommend follow-up with PCP for possible pain management referral.  Return to clinic as needed

## 2021-05-22 NOTE — Telephone Encounter (Signed)
Patient returned called to Dr Ellard Artis for results

## 2021-05-22 NOTE — Telephone Encounter (Signed)
Spoke with patient.  Reviewed the results over the phone.  Return to clinic as needed.  Thanks, Dr. Amalia Hailey

## 2021-05-28 ENCOUNTER — Telehealth: Payer: Self-pay | Admitting: Primary Care

## 2021-05-28 NOTE — Telephone Encounter (Signed)
Patient was seen by [facility] last for this. Do not see any referral or documentation that one was needed.  ?

## 2021-05-28 NOTE — Telephone Encounter (Signed)
Pt called in wants to know status of referral to the GI  . Please advise [phone]  ?

## 2021-05-28 NOTE — Telephone Encounter (Signed)
Matt, do you know anything about this? ?I believe she is already seeing GI. ?

## 2021-05-28 NOTE — Telephone Encounter (Signed)
Noted. Joellen, will you update her? ?

## 2021-05-28 NOTE — Telephone Encounter (Signed)
I did send a message to her GI provider making them aware and they did not need any acute follow up just if she has issues she can call them to see them. I did not communicate that with the patient. I apologize ?

## 2021-05-29 ENCOUNTER — Telehealth: Payer: Self-pay | Admitting: Gastroenterology

## 2021-05-29 NOTE — Telephone Encounter (Signed)
Patient left vm requesting to talk to Dr Allen Norris or his nurse. Patient states she has diarrhea since 3am. Has a few questions and states that she is taking a 325 MG ferrous sulfate and a stool softener as well. Requesting call back as soon as possible. ?

## 2021-05-29 NOTE — Telephone Encounter (Signed)
Called and notified pt  to follow up with GI  ?

## 2021-06-02 ENCOUNTER — Telehealth: Payer: Self-pay

## 2021-06-02 DIAGNOSIS — D649 Anemia, unspecified: Secondary | ICD-10-CM

## 2021-06-02 NOTE — Telephone Encounter (Signed)
I spoke to pt and she stated that she started the iron supplement following ED visit and PCP suggested she start stool softeners daily as well. Pt started to experience loose stools so she stopped taking stool softeners... Recently, a friend told her to stop iron supplement as well and pt has stopped...  ? ?Pt states PCP told her to reach out to you to see if she needs to f/u or should she continue iron at this time and f/u in the future? Recent labs with PCP 05/18/21 are in the chart... Pt denies any current Sx, bleeding or pain... Please advise  ?

## 2021-06-02 NOTE — Telephone Encounter (Signed)
Patient left a voicemail and states the left a voicemail on Friday and needs to talk to Dr. Allen Norris nurse today. She was seen in the hospital for a GI bleed.  ?

## 2021-06-03 NOTE — Addendum Note (Signed)
Addended by: Lurlean Nanny on: 06/03/2021 03:49 PM ? ? Modules accepted: Orders ? ?

## 2021-06-03 NOTE — Telephone Encounter (Signed)
Pt is aware of labs needed and states she will come tomorrow to have them drawn ?

## 2021-06-05 LAB — CBC
Hematocrit: 42.3 % (ref 34.0–46.6)
Hemoglobin: 13.9 g/dL (ref 11.1–15.9)
MCH: 30.9 pg (ref 26.6–33.0)
MCHC: 32.9 g/dL (ref 31.5–35.7)
MCV: 94 fL (ref 79–97)
Platelets: 427 10*3/uL (ref 150–450)
RBC: 4.5 x10E6/uL (ref 3.77–5.28)
RDW: 12.2 % (ref 11.7–15.4)
WBC: 6.1 10*3/uL (ref 3.4–10.8)

## 2021-06-05 LAB — IRON: Iron: 61 ug/dL (ref 27–159)

## 2021-06-10 ENCOUNTER — Telehealth: Payer: Self-pay | Admitting: Gastroenterology

## 2021-06-10 ENCOUNTER — Telehealth: Payer: Self-pay

## 2021-06-10 NOTE — Telephone Encounter (Signed)
-----   Message from [name], MD sent at 06/08/2021  3:17 PM EDT ----- ?She can try to restart the iron and see if her stools become loose again. ?----- Message ----- ?From: Lurlean Nanny, CMA ?Sent: 06/05/2021  12:25 PM EDT ?To: [name], MD ? ?Pt wants to know if she should restart iron supplement that she d/c last week due to her loose stools... ? ?

## 2021-06-10 NOTE — Telephone Encounter (Signed)
Patient verbalized understanding. Patient states she is not taking BC  ?

## 2021-06-10 NOTE — Telephone Encounter (Signed)
Patient left vm requesting a call back with lab results.  ?

## 2021-06-10 NOTE — Telephone Encounter (Signed)
I called patient and went over results  ?

## 2021-06-18 ENCOUNTER — Telehealth: Payer: Self-pay

## 2021-06-18 DIAGNOSIS — J309 Allergic rhinitis, unspecified: Secondary | ICD-10-CM

## 2021-06-18 DIAGNOSIS — M722 Plantar fascial fibromatosis: Secondary | ICD-10-CM

## 2021-06-18 DIAGNOSIS — G609 Hereditary and idiopathic neuropathy, unspecified: Secondary | ICD-10-CM

## 2021-06-18 MED ORDER — CETIRIZINE HCL 10 MG PO TABS: ORAL_TABLET | ORAL | 1 refills | Status: DC

## 2021-06-18 NOTE — Telephone Encounter (Signed)
New message    1. Which medications need to be refilled? (please list name of each medication and dose if known)   traZODone (DESYREL) 50 MG tablet  -    cetirizine (ZYRTEC) 10 MG tablet    oxybutynin (DITROPAN-XL) 5 MG 24 hr tablet   2. Which pharmacy/location (including street and city if local pharmacy) is medication to be sent to?[id number] [place] [place] [address]. Do they need a 30 day or 90 day supply? 90 days supply

## 2021-06-18 NOTE — Telephone Encounter (Signed)
Please call patient: ? ?She has plenty of refills on file at her pharmacy for oxybutynin and trazodone.  She needs to request the refill from her pharmacy. ? ?I will send in a refill of her Zyrtec for allergies. ?

## 2021-06-18 NOTE — Telephone Encounter (Signed)
Called patient reviewed all information and repeated back to me. Will call if any questions.  ? ?Patient wanted to know if you can get referral started to [facility] for pain management. Do you need to see her for that? It is for her feet.  ?

## 2021-06-19 NOTE — Telephone Encounter (Signed)
Please notify patient that I am happy to help. ? ?Is she still taking the Cymbalta 90 mg? ?I only see the 60 mg capsule on her medication list, it looks like the 30 mg capsule was discontinued. ? ?Also, what about the Lyrica?  Did that ever help with her pain in the past? ? ? ?

## 2021-06-19 NOTE — Telephone Encounter (Signed)
Noted. ? ?On her chart we have duloxetine 60 mg daily.  Need to confirm for sure what she is taking. ? ?During our last visit we added an extra 30 mg daily for her pain. ? ?I will place a referral to pain management. ? ?

## 2021-06-19 NOTE — Telephone Encounter (Signed)
She is taking Cymbalta 30 mg 2 tab  ? ?She does not remember if the lyrical helped at all. She does not remember taking.  ? ?

## 2021-06-20 ENCOUNTER — Other Ambulatory Visit: Payer: Self-pay | Admitting: Primary Care

## 2021-06-20 DIAGNOSIS — G43111 Migraine with aura, intractable, with status migrainosus: Secondary | ICD-10-CM

## 2021-06-22 NOTE — Telephone Encounter (Signed)
Yes please, thank you.

## 2021-06-22 NOTE — Telephone Encounter (Signed)
While on the phone with patient verified x 3 how she is taking the Cymbalta. Do you want me to call pharmacy to see what she has been picking up and when?  ?

## 2021-06-24 NOTE — Telephone Encounter (Signed)
Called pharmacy was on hold for 45mn and 15 sec then call was d/c. Not able to call back at this time.  ?

## 2021-06-25 NOTE — Telephone Encounter (Signed)
[facility] [address] [facility]  received a 90 day supply on 05/06/21 of the '30mg'$  and on 04/03/21 pt got 60 mg 90 day supply , pt is taking this medication  ?

## 2021-06-25 NOTE — Telephone Encounter (Signed)
Can we relay this information to patient. ? ?Have her check her medicine bottles with you on the phone. ? ?Per our last discussion we increased her Cymbalta to a total of 90 mg.  This included her 60 mg dose and an extra 30 mg dose. ?

## 2021-06-26 NOTE — Telephone Encounter (Signed)
Noted, thank you for calling.

## 2021-06-26 NOTE — Telephone Encounter (Signed)
I called and spoke with the patient and she states that she doesn't have the Duloxetine '60mg'$  capsules she only has Duloxetine '30mg'$  capsules and she has been taking the '30mg'$  twice a day. She seemed confused about ever having the Duloxetine '60mg'$  at all so I called and confirmed again with the pharmacy that it was filled for a 90 day supply on 04/03/21 and the Duloxetine '30mg'$  was filled on 05/06/21 for a 90 day supply. I advised the patient that she has a refill of the Duloxetine '60mg'$  at the pharmacy and she should fill that. Then, take one '60mg'$  capsule and one '30mg'$  capsule once daily NOT a '30mg'$  twice daily. She states that her feet have been hurting more and that may be why because she hasn't been taking '90mg'$  a day only '60mg'$ .  ?

## 2021-06-26 NOTE — Telephone Encounter (Signed)
Please notify patient that she should be taking one 60 mg capsule of Cymbalta ONCE daily and one 30 mg capsule of Cymbalta ONCE daily.  ? ?Basically, a total of 90 mg of Cymbalta ONCE daily.  ? ?She should not be taking twice daily.  I added the 30 mg capsule back to her medication list as someone deleted it. ?

## 2021-06-26 NOTE — Telephone Encounter (Signed)
Called  and spoke with patient she has 30 pills of '60mg'$  and of '30mg'$ , pt is taking this BID . Pt said would like to be referred to a pain specialist  for foot pain she said that medication is helping some , pt worse after work all day.  ?

## 2021-06-26 NOTE — Telephone Encounter (Signed)
Called and lvm for pat to return call. ?

## 2021-07-08 ENCOUNTER — Telehealth: Payer: Self-pay | Admitting: *Deleted

## 2021-07-08 ENCOUNTER — Telehealth: Payer: Self-pay | Admitting: Primary Care

## 2021-07-08 NOTE — Telephone Encounter (Signed)
No, she may not increase her dose of duloxetine. ? ?From prior phone notes she recently started taking the prescribed regimen of the 60 mg capsule and the 30 mg capsule to make 90 mg.  It will take several more weeks for her to notice improvement on the increased dose if she just resumed both capsules. ?

## 2021-07-08 NOTE — Telephone Encounter (Signed)
Pt is requesting a refill. 

## 2021-07-08 NOTE — Telephone Encounter (Signed)
Spoke with patient she said that she wanted to know if she can 2 of '60mg'$  and1 '30mg'$  to help with pain because the the '90mg'$  is not help. And that way she will not have to go to pain management. Pt said that she tried increasing this her self and had did help with pain. I advised pt not up on her medication with talking with her provider , please advise. ?

## 2021-07-08 NOTE — Telephone Encounter (Signed)
Called and lvm for patient to call us back. 

## 2021-07-08 NOTE — Telephone Encounter (Signed)
Is she actually requesting a refill of these medications or did she have a specific question? ? ?There was a prior phone note regarding her duloxetine and she seemed quite confused about our plan. ? ?During her last visit we initiated duloxetine 30 mg for her to take in addition to her existing duloxetine 60 mg.  She should be taking a total of 90 mg daily. ? ?Also, please notify her that I placed a referral to pain management on 06/19/2021.  It may take another week before they get in touch with her. ?

## 2021-07-08 NOTE — Telephone Encounter (Signed)
Called and spoke with patient inform that she will not continue with  90 mg . And not increase her medication. Explain to pt that this will take a couple before she  will see an improve, pt understood.  ?

## 2021-07-08 NOTE — Telephone Encounter (Signed)
Patient requesting a refill on the Tramadol-50 mg, "feet are killing me"Please advise. ?

## 2021-07-08 NOTE — Telephone Encounter (Signed)
Pt called and wants to see if she could get a referral for pain management, pt did not anything else other than needing a referral. Wants to speak with the nurse about this. Pt stated she is on these medications: DULoxetine (CYMBALTA) 30 MG capsule.    ? ?DULoxetine (CYMBALTA) 60 MG capsule ? ?Please advise, thank you.  ? ?Callback Number: [phone] ?

## 2021-07-10 ENCOUNTER — Other Ambulatory Visit: Payer: Self-pay | Admitting: Podiatry

## 2021-07-10 MED ORDER — DICLOFENAC SODIUM 75 MG PO TBEC: 75.0000 mg | DELAYED_RELEASE_TABLET | Freq: Two times a day (BID) | ORAL | 1 refills | Status: DC

## 2021-07-10 NOTE — Telephone Encounter (Signed)
Rx diclofenac sent to pharmacy. - Dr. Amalia Hailey

## 2021-07-10 NOTE — Telephone Encounter (Signed)
Pt calling about medication refills. She hasnt heard back from anyone. She states that her "feet is killing her" ? ?traMADol (ULTRAM) 50 MG tablet ?Or  ?diclofenac (VOLTAREN) 75 MG EC tablet  ? ?[id number] [place] [address] ?[id number] ?Phone:  [phone] Fax:  [phone] ? ?Please advise. ? ? ?

## 2021-07-10 NOTE — Progress Notes (Signed)
PRN foot pain ?

## 2021-07-14 ENCOUNTER — Other Ambulatory Visit: Payer: Self-pay | Admitting: Primary Care

## 2021-07-14 ENCOUNTER — Ambulatory Visit: Payer: 59 | Admitting: Podiatry

## 2021-07-14 DIAGNOSIS — G43709 Chronic migraine without aura, not intractable, without status migrainosus: Secondary | ICD-10-CM

## 2021-07-14 NOTE — Telephone Encounter (Signed)
Patient has been notified of prescription.

## 2021-07-17 ENCOUNTER — Ambulatory Visit (INDEPENDENT_AMBULATORY_CARE_PROVIDER_SITE_OTHER): Payer: 59 | Admitting: Podiatry

## 2021-07-17 DIAGNOSIS — M722 Plantar fascial fibromatosis: Secondary | ICD-10-CM | POA: Diagnosis not present

## 2021-07-17 MED ORDER — BETAMETHASONE SOD PHOS & ACET 6 (3-3) MG/ML IJ SUSP
3.0000 mg | Freq: Once | INTRAMUSCULAR | Status: AC
  Administered 2021-07-17: 3 mg via INTRA_ARTICULAR

## 2021-07-17 NOTE — Progress Notes (Signed)
Neuro ? ?Subjective: ?56 year old female presenting today for follow up evaluation of bilateral chronic severe pain with idiopathic peripheral neuropathy.  Patient is being managed closely with PCP for chronic pain.  Patient states injections in the past have helped significantly.  She is requesting another injection today.  She presents for follow-up treatment and evaluation ? ? ?Past Medical History:  ?Diagnosis Date  ? Acquired pyloric stricture   ? Bleeding duodenal ulcer   ? Chronic esophagogastric ulcer   ? Depression   ? Duodenal obstruction   ? Gastric outlet obstruction 01/19/2018  ? GERD (gastroesophageal reflux disease)   ? Hair loss 09/22/2018  ? History of stomach ulcers   ? Hypothyroidism   ? Incisional hernia, without obstruction or gangrene   ? Intractable vomiting   ? Kidney stones   ? Migraine   ? Migraine headache   ? only1-2x/month since starting topamax  ? Neuropathy   ? Side pain 03/30/2018  ? ? ? ?Objective: ?Physical Exam ?General: The patient is alert and oriented x3 in no acute distress. ? ?Dermatology: Skin is warm, dry and supple bilateral lower extremities. Negative for open lesions or macerations bilateral.  ? ?Vascular: Dorsalis Pedis and Posterior Tibial pulses palpable bilateral.  Capillary fill time is immediate to all digits. ? ?Neurological: Epicritic and protective threshold intact bilateral.  Patient does experience burning with pins-and-needles to the bilateral feet daily ? ?Musculoskeletal: There continues to be chronic severe tenderness to palpation to the plantar aspect of the bilateral heels along the plantar fascia as well as hypersensitivity throughout the entire foot. All other joints range of motion within normal limits bilateral. Strength 5/5 in all groups bilateral.  ? ?Assessment: ?1. plantar fasciitis bilateral feet  ?2. Idiopathic peripheral neuropathy bilateral  ? ?Plan of Care:  ?1. Patient evaluated.   ?2. Injection of 0.5cc Celestone Soluspan injected into the  bilateral heels. ?3. No NSAIDs prescribed due to H/o GI bleeds according to patient.  ?4. Continue mgmt with PCP for chronic pain ?5. RTC 4 weeks ? ?Working 3 days/week at [employer]. ? ?Edrick Kins, DPM ?[place] ? ?Dr. Edrick Kins, DPM  ?  ?[address]                                   ?[place], [place] [address]                ?Office [phone]  ?Fax [phone] ? ? ? ? ?

## 2021-07-29 ENCOUNTER — Telehealth: Payer: Self-pay

## 2021-07-29 NOTE — Telephone Encounter (Signed)
She was seen on 03/13/20 as a new patient. You had ordered a pysch eval and return afterwards. She never got that done and now we have a new referral sent for her. I called her and she said she didn't even remember coming here and didn't go for the pysch. How do you want me to proceed? Return as a new patient or do you want to put the order for pysch in and we will try this again?

## 2021-07-31 ENCOUNTER — Ambulatory Visit
Admission: RE | Admit: 2021-07-31 | Discharge: 2021-07-31 | Disposition: A | Discharge status: Home / Self Care | Payer: 59 | Source: Ambulatory Visit | Attending: Primary Care | Admitting: Primary Care

## 2021-07-31 DIAGNOSIS — Z1231 Encounter for screening mammogram for malignant neoplasm of breast: Secondary | ICD-10-CM | POA: Diagnosis present

## 2021-08-06 DIAGNOSIS — M79676 Pain in unspecified toe(s): Secondary | ICD-10-CM

## 2021-08-13 ENCOUNTER — Telehealth: Payer: Self-pay | Admitting: Podiatry

## 2021-08-13 ENCOUNTER — Other Ambulatory Visit: Payer: Self-pay | Admitting: Podiatry

## 2021-08-13 MED ORDER — TRAMADOL HCL 50 MG PO TABS: 50.0000 mg | ORAL_TABLET | Freq: Three times a day (TID) | ORAL | 0 refills | Status: DC | PRN

## 2021-08-13 NOTE — Telephone Encounter (Signed)
Rx sent. - Dr. Keldric Poyer

## 2021-08-13 NOTE — Telephone Encounter (Signed)
Patient has been notified

## 2021-08-13 NOTE — Telephone Encounter (Signed)
Pt called in and requested a refill on RX Tramadol 50. She uses [facility] on [address], [place]. Please advise

## 2021-08-13 NOTE — Telephone Encounter (Signed)
Pt has been notified.

## 2021-08-21 ENCOUNTER — Ambulatory Visit: Payer: 59 | Admitting: Podiatry

## 2021-09-02 ENCOUNTER — Ambulatory Visit (INDEPENDENT_AMBULATORY_CARE_PROVIDER_SITE_OTHER): Payer: 59 | Admitting: Primary Care

## 2021-09-02 VITALS — BP 110/62 | HR 105 | Temp 98.6°F | Ht 59.0 in | Wt 108.0 lb

## 2021-09-02 DIAGNOSIS — F331 Major depressive disorder, recurrent, moderate: Secondary | ICD-10-CM

## 2021-09-02 DIAGNOSIS — G43709 Chronic migraine without aura, not intractable, without status migrainosus: Secondary | ICD-10-CM | POA: Diagnosis not present

## 2021-09-02 DIAGNOSIS — G4700 Insomnia, unspecified: Secondary | ICD-10-CM | POA: Diagnosis not present

## 2021-09-02 DIAGNOSIS — F419 Anxiety disorder, unspecified: Secondary | ICD-10-CM | POA: Diagnosis not present

## 2021-09-02 MED ORDER — KETOROLAC TROMETHAMINE 60 MG/2ML IM SOLN
60.0000 mg | Freq: Once | INTRAMUSCULAR | Status: AC
  Administered 2021-09-02: 60 mg via INTRAMUSCULAR

## 2021-09-02 MED ORDER — BUSPIRONE HCL 5 MG PO TABS: 5.0000 mg | ORAL_TABLET | Freq: Two times a day (BID) | ORAL | 0 refills | Status: DC

## 2021-09-02 NOTE — Assessment & Plan Note (Signed)
Empathy provided today.  Discussed options, she would like to proceed with both medication treatment and therapy.  Referral placed for therapy. Rx for Buspar 5 mg BID sent to pharmacy. Continue Duloxetine 90 mg daily.   Follow up in 1 month.
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
## 2021-09-02 NOTE — Assessment & Plan Note (Signed)
Deteriorated given mother's decline in overall health and lack of family support.  Continue duloxetine 90 mg daily. Add Buspar 5 mg BID. Continue Trazodone 100 mg HS.  Referral placed for therapy.  Empathy provided today.  Follow up in 1 month.

## 2021-09-02 NOTE — Assessment & Plan Note (Signed)
Controlled.  Continue Trazodone 100 mg HS. 

## 2021-09-02 NOTE — Patient Instructions (Signed)
Start buspirone (Buspar) 5 mg tablets for anxiety. Take 1 tablet by mouth once daily for 3-4 days, then increase to 1 tablet twice daily thereafter if needed.  Continue taking duloxetine (Cymbalta) 30 mg and 60 mg for depression.  You will be contacted regarding your referral to therapy.  Please let us know if you have not been contacted within two weeks.   Please schedule a follow up visit for 1 month for anxiety/depression.  It was a pleasure to see you today!

## 2021-09-02 NOTE — Addendum Note (Signed)
Addended by: Francella Solian on: 09/02/2021 09:35 AM   Modules accepted: Orders

## 2021-09-02 NOTE — Assessment & Plan Note (Signed)
Increased frequency in light of her mother's declining health.  Working to treat anxiety and depression today. IM Toradol 60 mg provided today.  Continue Topamax 100 mg HS.

## 2021-09-17 ENCOUNTER — Encounter: Payer: Self-pay | Admitting: Student in an Organized Health Care Education/Training Program

## 2021-09-17 ENCOUNTER — Telehealth: Payer: Self-pay | Admitting: Primary Care

## 2021-09-17 ENCOUNTER — Other Ambulatory Visit: Payer: Self-pay | Admitting: Family

## 2021-09-17 ENCOUNTER — Encounter: Payer: Self-pay | Admitting: Family

## 2021-09-17 ENCOUNTER — Ambulatory Visit (INDEPENDENT_AMBULATORY_CARE_PROVIDER_SITE_OTHER)
Admission: RE | Admit: 2021-09-17 | Discharge: 2021-09-17 | Disposition: A | Discharge status: Home / Self Care | Payer: 59 | Source: Ambulatory Visit | Attending: Family | Admitting: Family

## 2021-09-17 ENCOUNTER — Ambulatory Visit
Payer: Worker's Compensation | Attending: Student in an Organized Health Care Education/Training Program | Admitting: Student in an Organized Health Care Education/Training Program

## 2021-09-17 ENCOUNTER — Ambulatory Visit: Payer: 59 | Admitting: Family

## 2021-09-17 ENCOUNTER — Ambulatory Visit (INDEPENDENT_AMBULATORY_CARE_PROVIDER_SITE_OTHER): Payer: 59 | Admitting: Family

## 2021-09-17 VITALS — BP 115/66 | HR 95 | Temp 96.0°F | Resp 16 | Ht 59.0 in | Wt 104.0 lb

## 2021-09-17 VITALS — BP 116/52 | HR 97 | Temp 98.9°F | Resp 16 | Ht 59.0 in | Wt 104.5 lb

## 2021-09-17 DIAGNOSIS — F3130 Bipolar disorder, current episode depressed, mild or moderate severity, unspecified: Secondary | ICD-10-CM | POA: Insufficient documentation

## 2021-09-17 DIAGNOSIS — F4310 Post-traumatic stress disorder, unspecified: Secondary | ICD-10-CM | POA: Diagnosis not present

## 2021-09-17 DIAGNOSIS — R079 Chest pain, unspecified: Secondary | ICD-10-CM | POA: Insufficient documentation

## 2021-09-17 DIAGNOSIS — G609 Hereditary and idiopathic neuropathy, unspecified: Secondary | ICD-10-CM | POA: Diagnosis not present

## 2021-09-17 DIAGNOSIS — G894 Chronic pain syndrome: Secondary | ICD-10-CM | POA: Diagnosis present

## 2021-09-17 DIAGNOSIS — Z0289 Encounter for other administrative examinations: Secondary | ICD-10-CM | POA: Diagnosis present

## 2021-09-17 DIAGNOSIS — R911 Solitary pulmonary nodule: Secondary | ICD-10-CM

## 2021-09-17 DIAGNOSIS — M79671 Pain in right foot: Secondary | ICD-10-CM | POA: Diagnosis not present

## 2021-09-17 DIAGNOSIS — M79672 Pain in left foot: Secondary | ICD-10-CM | POA: Insufficient documentation

## 2021-09-17 DIAGNOSIS — M722 Plantar fascial fibromatosis: Secondary | ICD-10-CM | POA: Insufficient documentation

## 2021-09-17 DIAGNOSIS — R0781 Pleurodynia: Secondary | ICD-10-CM | POA: Insufficient documentation

## 2021-09-17 DIAGNOSIS — M546 Pain in thoracic spine: Secondary | ICD-10-CM | POA: Diagnosis not present

## 2021-09-17 DIAGNOSIS — R9389 Abnormal findings on diagnostic imaging of other specified body structures: Secondary | ICD-10-CM | POA: Insufficient documentation

## 2021-09-17 DIAGNOSIS — M545 Low back pain, unspecified: Secondary | ICD-10-CM | POA: Insufficient documentation

## 2021-09-17 DIAGNOSIS — M62838 Other muscle spasm: Secondary | ICD-10-CM | POA: Insufficient documentation

## 2021-09-17 HISTORY — DX: Solitary pulmonary nodule: R91.1

## 2021-09-17 HISTORY — DX: Chest pain, unspecified: R07.9

## 2021-09-17 LAB — POC URINALSYSI DIPSTICK (AUTOMATED)
Bilirubin, UA: NEGATIVE
Blood, UA: NEGATIVE
Glucose, UA: NEGATIVE
Ketones, UA: NEGATIVE
Leukocytes, UA: NEGATIVE
Nitrite, UA: NEGATIVE
Protein, UA: POSITIVE — AB
Spec Grav, UA: 1.025 (ref 1.010–1.025)
Urobilinogen, UA: 0.2 E.U./dL
pH, UA: 5 (ref 5.0–8.0)

## 2021-09-17 MED ORDER — TIZANIDINE HCL 2 MG PO TABS: ORAL_TABLET | ORAL | 0 refills | Status: DC

## 2021-09-17 MED ORDER — TRAMADOL HCL 50 MG PO TABS: 50.0000 mg | ORAL_TABLET | Freq: Two times a day (BID) | ORAL | 2 refills | Status: DC | PRN

## 2021-09-17 NOTE — Progress Notes (Signed)
Safety precautions to be maintained throughout the outpatient stay will include: orient to surroundings, keep bed in low position, maintain call bell within reach at all times, provide assistance with transfer out of bed and ambulation.  

## 2021-09-17 NOTE — Progress Notes (Signed)
There was some scarring seen in the right mid lung, chest CT 2022 showed a nodule that needed to be repeated. Ordering chest CT given nature of her symptoms as well today. Await referral team.   Also, rib xray without fracture, not concerning. Sending in tizanidine for pain, see if this helps do not take with flexeril.

## 2021-09-17 NOTE — Progress Notes (Signed)
Established Patient Office Visit  Subjective:  Patient ID: Tanya Nguyen, female    DOB: 12/04/1965  Age: 56 y.o. MRN: [id number]  CC:  Chief Complaint  Patient presents with   Muscle Pain    X 2 weeks right side   Weight Loss    Due to stress    HPI Tanya Nguyen is here today with concerns.   Two weeks ago pulled something in her back, she was working at [employer] and was pulling clothes out of the washer and she felt a snap on her right lateral side. Since its hard to move exacerbated with movement.   No urinary symptoms. Able to pee ok .  Takes tylenol without any relief of the pain.  Takes tramadol for her feet without relief.   Bowel movements are ok.  No sob just hard to take a deep breath without pain.    Past Medical History:  Diagnosis Date   Acquired pyloric stricture    Bleeding duodenal ulcer    Chronic esophagogastric ulcer    Depression    Duodenal obstruction    Gastric outlet obstruction 01/19/2018   GERD (gastroesophageal reflux disease)    Hair loss 09/22/2018   History of stomach ulcers    Hypothyroidism    Incisional hernia, without obstruction or gangrene    Intractable vomiting    Kidney stones    Migraine    Migraine headache    only1-2x/month since starting topamax   Neuropathy    Side pain 03/30/2018    Past Surgical History:  Procedure Laterality Date   ABDOMINAL HYSTERECTOMY  2004   partial   CHOLECYSTECTOMY     COLONOSCOPY WITH PROPOFOL N/A 02/27/2018   Procedure: COLONOSCOPY WITH PROPOFOL;  Surgeon: [name], MD;  Location: [place];  Service: Endoscopy;  Laterality: N/A;   ESOPHAGOGASTRODUODENOSCOPY (EGD) WITH PROPOFOL N/A 01/12/2018   Procedure: ESOPHAGOGASTRODUODENOSCOPY (EGD) WITH BIOPSIES;  Surgeon: [name], MD;  Location: [place];  Service: Endoscopy;  Laterality: N/A;   ESOPHAGOGASTRODUODENOSCOPY (EGD) WITH PROPOFOL N/A 01/25/2018   Procedure: ESOPHAGOGASTRODUODENOSCOPY (EGD) WITH  PROPOFOL;  Surgeon: Jonathon Bellows, MD;  Location: [facility];  Service: Gastroenterology;  Laterality: N/A;   ESOPHAGOGASTRODUODENOSCOPY (EGD) WITH PROPOFOL N/A 02/27/2018   Procedure: ESOPHAGOGASTRODUODENOSCOPY (EGD) WITH PROPOFOL;  Surgeon: [name], MD;  Location: [place];  Service: Endoscopy;  Laterality: N/A;   ESOPHAGOGASTRODUODENOSCOPY (EGD) WITH PROPOFOL N/A 05/09/2021   Procedure: ESOPHAGOGASTRODUODENOSCOPY (EGD) WITH PROPOFOL;  Surgeon: Annamaria Helling, DO;  Location: [place];  Service: Gastroenterology;  Laterality: N/A;   INCISIONAL HERNIA REPAIR N/A 11/23/2018   Procedure: HERNIA REPAIR INCISIONAL;  Surgeon: Olean Ree, MD;  Location: [facility];  Service: General;  Laterality: N/A;   INSERTION OF MESH N/A 11/23/2018   Procedure: INSERTION OF MESH;  Surgeon: Olean Ree, MD;  Location: [facility];  Service: General;  Laterality: N/A;   LAPAROTOMY N/A 01/20/2018   Procedure: EXPLORATORY LAPAROTOMY;  Surgeon: Olean Ree, MD;  Location: [facility];  Service: General;  Laterality: N/A;   TOOTH EXTRACTION      Family History  Problem Relation Age of Onset   Cancer Mother    Breast cancer Mother 62   Lung cancer Father    Alcohol abuse Sister    Bipolar disorder Sister    Alcohol abuse Maternal Grandmother    Stroke Maternal Grandmother    Stroke Paternal Grandmother     Social History   Socioeconomic History   Marital status: Married  Spouse name: Not on file   Number of children: 0   Years of education: 12   Highest education level: High school graduate  Occupational History   Occupation: Homemaker  Tobacco Use   Smoking status: Never   Smokeless tobacco: Never  Vaping Use   Vaping Use: Never used  Substance and Sexual Activity   Alcohol use: Not Currently    Alcohol/week: 0.0 standard drinks of alcohol   Drug use: No   Sexual activity: Yes    Birth control/protection: None  Other Topics Concern   Not on file  Social History  Narrative   Lives at home with her husband.   Right-handed.   Rare caffeine.   Social Determinants of Health   Financial Resource Strain: Not on file  Food Insecurity: Not on file  Transportation Needs: Not on file  Physical Activity: Not on file  Stress: Not on file  Social Connections: Not on file  Intimate Partner Violence: Not on file    Outpatient Medications Prior to Visit  Medication Sig Dispense Refill   busPIRone (BUSPAR) 5 MG tablet Take 1 tablet (5 mg total) by mouth 2 (two) times daily. For anxiety 60 tablet 0   cetirizine (ZYRTEC) 10 MG tablet TAKE 1 TABLET BY MOUTH EVERY DAY AS NEEDED FOR ALLERGY 90 tablet 1   cyclobenzaprine (FLEXERIL) 5 MG tablet TAKE 1 TABLET (5 MG TOTAL) BY MOUTH 2 (TWO) TIMES DAILY AS NEEDED (MIGRAINES). 30 tablet 0   DULoxetine (CYMBALTA) 30 MG capsule Take 30 mg by mouth daily.     DULoxetine (CYMBALTA) 60 MG capsule Take 1 capsule (60 mg total) by mouth daily. For anxiety and pain. 90 capsule 1   ferrous sulfate 325 (65 FE) MG tablet Take 1 tablet (325 mg total) by mouth every other day. 30 tablet 0   fluticasone (FLONASE) 50 MCG/ACT nasal spray Place 1 spray into both nostrils 2 (two) times daily. 16 g 0   levothyroxine (SYNTHROID) 88 MCG tablet TAKE 1 TABLET BY MOUTH EVERY MORNING ON EMPTY STOMACH WITH WATER ONLY-NO FOOD OR OTHER MED FOR 30MIN 90 tablet 1   oxybutynin (DITROPAN-XL) 5 MG 24 hr tablet TAKE 1 TABLET (5 MG TOTAL) BY MOUTH AT BEDTIME. FOR BLADDER INCONTINENCE. 90 tablet 2   pantoprazole (PROTONIX) 40 MG tablet Take 1 tablet (40 mg total) by mouth daily. 30 tablet 0   SUMAtriptan (IMITREX) 50 MG tablet TAKE 1 TABLET BY MOUTH AT MIGRAINE ONSET. MAY REPEAT WITH ANOTHER TABLET 2 HOURS LATER IF NEEDED. 9 tablet 0   topiramate (TOPAMAX) 100 MG tablet TAKE 1 TABLET (100 MG TOTAL) BY MOUTH AT BEDTIME. FOR MIGRAINE PREVENTION. 90 tablet 2   traMADol (ULTRAM) 50 MG tablet Take 1 tablet (50 mg total) by mouth every 8 (eight) hours as needed. 30  tablet 0   traZODone (DESYREL) 50 MG tablet Take 3 tablets (150 mg total) by mouth at bedtime. For sleep. 270 tablet 2   No facility-administered medications prior to visit.    Allergies  Allergen Reactions   Penicillins Shortness Of Breath, Diarrhea and Nausea And Vomiting    Has patient had a PCN reaction causing immediate rash, facial/tongue/throat swelling, SOB or lightheadedness with hypotension: yes Has patient had a PCN reaction causing severe rash involving mucus membranes or skin necrosis: no Has patient had a PCN reaction that required hospitalization no Has patient had a PCN reaction occurring within the last 10 years: about 10 years If all of the above answers are "NO",  then may proceed with Cephalosporin use.    Gabapentin Other (See Comments)    Hair Loss    Pregabalin     Tremors   Sulfa Antibiotics Nausea And Vomiting        Objective:    Physical Exam Constitutional:      General: She is in acute distress (in alot of pain).     Appearance: Normal appearance. She is normal weight. She is not ill-appearing, toxic-appearing or diaphoretic.  Cardiovascular:     Rate and Rhythm: Normal rate and regular rhythm.  Pulmonary:     Effort: Pulmonary effort is normal.     Breath sounds: Normal breath sounds.  Chest:     Chest wall: Tenderness (right lateral side of chest directly below rib line) present.    Abdominal:     General: Abdomen is flat.  Musculoskeletal:       Back:  Neurological:     General: No focal deficit present.     Mental Status: She is alert and oriented to person, place, and time.  Psychiatric:        Mood and Affect: Mood normal. Affect is tearful (in pain).        Behavior: Behavior normal.        Thought Content: Thought content normal.        Judgment: Judgment normal.     BP (!) 116/52   Pulse 97   Temp 98.9 F (37.2 C)   Resp 16   Ht '4\' 11"'$  (1.499 m)   Wt 104 lb 8 oz (47.4 kg)   SpO2 99%   BMI 21.11 kg/m  Wt Readings  from Last 3 Encounters:  09/17/21 104 lb 8 oz (47.4 kg)  09/02/21 108 lb (49 kg)  05/18/21 116 lb 4 oz (52.7 kg)     There are no preventive care reminders to display for this patient.  There are no preventive care reminders to display for this patient.  Lab Results  Component Value Date   TSH 2.09 07/16/2020   Lab Results  Component Value Date   WBC 6.1 06/04/2021   HGB 13.9 06/04/2021   HCT 42.3 06/04/2021   MCV 94 06/04/2021   PLT 427 06/04/2021   Lab Results  Component Value Date   NA 141 05/18/2021   K 3.7 05/18/2021   CO2 24 05/18/2021   GLUCOSE 94 05/18/2021   BUN 12 05/18/2021   CREATININE 0.85 05/18/2021   BILITOT 0.7 05/08/2021   ALKPHOS 44 05/08/2021   AST 21 05/08/2021   ALT 12 05/08/2021   PROT 6.1 (L) 05/08/2021   ALBUMIN 3.4 (L) 05/08/2021   CALCIUM 8.0 (L) 05/18/2021   ANIONGAP 7 05/08/2021   GFR 76.47 05/18/2021   Lab Results  Component Value Date   HGBA1C 5.9 11/05/2020      Assessment & Plan:   Problem List Items Addressed This Visit       Other   Right-sided chest pain - Primary    Stat cxr pending results to r/o lung concern/pleurisy etc      Relevant Orders   DG Ribs Unilateral Right   DG Chest 2 View   POCT Urinalysis Dipstick (Automated)   Urine Culture   Rib pain on right side    Rib xray to r/o fracture Pending results  If any sudden onset sob or increasing pain go to er       Relevant Orders   DG Ribs Unilateral Right   Acute right-sided thoracic  back pain    Urine culture and poct urinalysis ordered and pending results  No urinary symptoms but want to assess for kidney stone due to acute pain.   Pending results to determine treatment       Relevant Orders   POCT Urinalysis Dipstick (Automated)   Urine Culture    No orders of the defined types were placed in this encounter.   Follow-up: No follow-ups on file.    Eugenia Pancoast, FNP

## 2021-09-17 NOTE — Telephone Encounter (Signed)
Patient is calling back stating she had more questions in regards to her xray results. Asked for a call back.

## 2021-09-17 NOTE — Assessment & Plan Note (Signed)
Stat cxr pending results to r/o lung concern/pleurisy etc

## 2021-09-17 NOTE — Progress Notes (Signed)
No acute fracture of ribs seen.  Already notified pt with chest xray.

## 2021-09-17 NOTE — Progress Notes (Signed)
PROVIDER NOTE: Information contained herein reflects review and annotations entered in association with encounter. Interpretation of such information and data should be left to medically-trained personnel. Information provided to patient can be located elsewhere in the medical record under "Patient Instructions". Document created using STT-dictation technology, any transcriptional errors that may result from process are unintentional.    Patient: Tanya Nguyen  Service Category: E/M  Provider: Gillis Santa, MD  DOB: [birth year]  DOS: 09/17/2021  Specialty: Interventional Pain Management  MRN: [id number]  Setting: Ambulatory outpatient  PCP: Pleas Koch, NP  Type: Established Patient    Referring Provider: Pleas Koch, NP  Location: Office  Delivery: Face-to-face     Primary Reason(s) for Visit: Encounter for evaluation before starting new chronic pain management plan of care (Level of risk: moderate) CC: Foot Pain (bilat)  HPI  Tanya Nguyen is a 56 y.o. year old, female patient, who comes today for a follow-up evaluation to review the test results and decide on a treatment plan. She has GERD (gastroesophageal reflux disease); Migraine; Hypothyroidism; Depression; Insomnia; Fatigue; Constipation; PTSD (post-traumatic stress disorder); Bipolar I disorder, most recent episode depressed ([place]); NSAID long-term use; Plantar fascial fibromatosis of both feet; Vitamin D deficiency; Vitamin B 12 deficiency; Chronic low back pain; Protein-calorie malnutrition, severe; Urge incontinence; Sacroiliac joint pain; Idiopathic peripheral neuropathy; CAP (community acquired pneumonia); Bilateral foot pain; Chronic pain syndrome; Melena; Hx of bypass gastrojejunostomy; Upper GI bleed; Low hemoglobin; Anxiety; Right-sided chest pain; Rib pain on right side; Acute right-sided thoracic back pain; and Pain management contract signed on their problem list. Her primarily concern today is the Foot Pain (bilat)  Pain  Assessment: Location: Right, Left Foot Radiating: up to knees bilat Onset: More than a month ago Duration: Chronic pain Quality: Burning, Numbness, Tingling Severity: 10-Worst pain ever/10 (subjective, self-reported pain score)  Effect on ADL: limits daily activities Timing: Constant Modifying factors: cymbalta, flexeril and Tramadol BP: 115/66  HR: 95  Tanya Nguyen comes in today for a follow-up visit after her initial evaluation on 03/13/20   She states that her current dose of tramadol providing effective analgesic and functional benefit.  States that Dr. Amalia Hailey her podiatrist prefers that tramadol be managed here.  We will increase dose to 50 mg twice daily for the purpose of obtaining better pain relief.  She will also sign a pain contract today.   HPI from initial clinic visit below: Tanya Nguyen is a pleasant 56 year old female who presents with a chief complaint of bilateral foot pain related to plantar fasciitis and idiopathic peripheral neuropathy.  She describes her pain as burning and tingling has been chronic in nature.  She has been followed by podiatry in the past and has received steroid injections in both of her feet, bilateral heels.  She has been referred by Dr. Amalia Hailey for medication management.  Patient is on tramadol and takes it 50 mg once or twice daily as needed.  She is also on Lyrica 75 mg twice a day and 225 mg nightly.  This is being managed by her primary care provider.  She is also on Cymbalta 30 mg as well as Zoloft 50 mg.  No symptoms concerning for serotonin syndrome such as agitation, nausea, diaphoresis, tachycardia, palpitations..  She would like to continue her tramadol and is requesting for it to be managed by the pain management clinic. Controlled Substance Pharmacotherapy Assessment REMS (Risk Evaluation and Mitigation Strategy)  Opioid Analgesic: Tramadol 50 mg twice daily as needed Pill Count: None expected due  to no prior prescriptions written by our  practice. Tanya Patience, RN  09/17/2021 10:39 AM  Sign when Signing Visit Safety precautions to be maintained throughout the outpatient stay will include: orient to surroundings, keep bed in low position, maintain call bell within reach at all times, provide assistance with transfer out of bed and ambulation.   Monitoring: [place] PMP: PDMP not reviewed this encounter. Online review of the past 33-monthperiod previously conducted. Not applicable at this point since we have not taken over the patient's medication management yet. List of other Serum/Urine Drug Screening Test(s):  Lab Results  Component Value Date   COCAINSCRNUR NONE DETECTED 02/26/2015   THCU NONE DETECTED 02/26/2015   ETH <5 02/26/2015   List of all UDS test(s) done:  Lab Results  Component Value Date   SUMMARY Note 03/13/2020   Last UDS on record: Summary  Date Value Ref Range Status  03/13/2020 Note  Final    Comment:    ==================================================================== Compliance Drug Analysis, Ur ==================================================================== Test                             Result       Flag       Units  Drug Present and Declared for Prescription Verification   Tramadol                       >11905       EXPECTED   ng/mg creat   O-Desmethyltramadol            9229         EXPECTED   ng/mg creat   N-Desmethyltramadol            3952         EXPECTED   ng/mg creat    Source of tramadol is a prescription medication. O-desmethyltramadol    and N-desmethyltramadol are expected metabolites of tramadol.    Pregabalin                     PRESENT      EXPECTED   Topiramate                     PRESENT      EXPECTED   Cyclobenzaprine                PRESENT      EXPECTED   Desmethylcyclobenzaprine       PRESENT      EXPECTED    Desmethylcyclobenzaprine is an expected metabolite of    cyclobenzaprine.    Duloxetine                     PRESENT      EXPECTED   Trazodone                       PRESENT      EXPECTED   1,3 chlorophenyl piperazine    PRESENT      EXPECTED    1,3-chlorophenyl piperazine is an expected metabolite of trazodone.  Drug Present not Declared for Prescription Verification   Salicylate                     PRESENT      UNEXPECTED  Drug Absent but Declared for Prescription Verification   Sertraline  Not Detected UNEXPECTED ==================================================================== Test                      Result    Flag   Units      Ref Range   Creatinine              42               mg/dL      >=20 ==================================================================== Declared Medications:  The flagging and interpretation on this report are based on the  following declared medications.  Unexpected results may arise from  inaccuracies in the declared medications.   **Note: The testing scope of this panel includes these medications:   Cyclobenzaprine  Duloxetine  Pregabalin  Sertraline  Topiramate  Tramadol  Trazodone   **Note: The testing scope of this panel does not include the  following reported medications:   Cetirizine  Fluticasone  Levothyroxine  Pantoprazole  Sumatriptan ==================================================================== For clinical consultation, please call [phone]. ====================================================================    UDS interpretation: No unexpected findings.          Medication Assessment Form: Not applicable. No opioids. Treatment compliance: Not applicable Risk Assessment Profile: Aberrant behavior: See initial evaluations. None observed or detected today Comorbid factors increasing risk of overdose: See initial evaluation. No additional risks detected today Opioid risk tool (ORT):     09/17/2021   10:50 AM  Opioid Risk   Alcohol 1  Illegal Drugs 2  Rx Drugs 0  Alcohol 0  Illegal Drugs 0  Rx Drugs 0  Age between 16-45 years  0   Psychological Disease 0  Depression 1  Opioid Risk Tool Scoring 4  Opioid Risk Interpretation Moderate Risk    ORT Scoring interpretation table:  Score <3 = Low Risk for SUD  Score between 4-7 = Moderate Risk for SUD  Score >8 = High Risk for Opioid Abuse   Risk of substance use disorder (SUD): Low  Risk Mitigation Strategies:  Patient opioid safety counseling: No controlled substances prescribed. Patient-Prescriber Agreement (PPA): No agreement signed.  Controlled substance notification to other providers: None required. No opioid therapy.  Pharmacologic Plan: Today we may be taking over the patient's pharmacological regimen. See below.             Laboratory Chemistry Profile   Renal Lab Results  Component Value Date   BUN 12 05/18/2021   CREATININE 0.85 05/18/2021   BCR 23 02/09/2019   GFR 76.47 05/18/2021   GFRAA 82 02/09/2019   GFRNONAA >60 05/08/2021   SPECGRAV 1.025 09/17/2021   PHUR 5.0 09/17/2021   PROTEINUR Positive (A) 09/17/2021     Electrolytes Lab Results  Component Value Date   NA 141 05/18/2021   K 3.7 05/18/2021   CL 110 05/18/2021   CALCIUM 8.0 (L) 05/18/2021   MG 1.8 01/26/2018   PHOS 4.8 (H) 01/26/2018     Hepatic Lab Results  Component Value Date   AST 21 05/08/2021   ALT 12 05/08/2021   ALBUMIN 3.4 (L) 05/08/2021   ALKPHOS 44 05/08/2021   LIPASE 40 01/19/2018     ID Lab Results  Component Value Date   HIV Non Reactive 05/10/2021   [place] NEGATIVE 05/08/2021   MRSAPCR NEGATIVE 01/19/2018     Bone Lab Results  Component Value Date   VD25OH 31.15 11/05/2020     Endocrine Lab Results  Component Value Date   GLUCOSE 94 05/18/2021   GLUCOSEU NEGATIVE 01/19/2018   HGBA1C  5.9 11/05/2020   TSH 2.09 07/16/2020   FREET4 0.95 01/10/2019     Neuropathy Lab Results  Component Value Date   VITAMINB12 762 11/05/2020   HGBA1C 5.9 11/05/2020   HIV Non Reactive 05/10/2021     CNS No results found for: "COLORCSF",  "APPEARCSF", "RBCCOUNTCSF", "WBCCSF", "POLYSCSF", "LYMPHSCSF", "EOSCSF", "PROTEINCSF", "GLUCCSF", "JCVIRUS", "CSFOLI", "IGGCSF", "LABACHR", "ACETBL"   Inflammation (CRP: Acute  ESR: Chronic) No results found for: "CRP", "ESRSEDRATE", "LATICACIDVEN"   Rheumatology No results found for: "RF", "ANA", "LABURIC", "URICUR", "LYMEIGGIGMAB", "LYMEABIGMQN", "HLAB27"   Coagulation Lab Results  Component Value Date   INR 1.06 01/19/2018   LABPROT 13.7 01/19/2018   PLT 427 06/04/2021     Cardiovascular Lab Results  Component Value Date   HGB 13.9 06/04/2021   HCT 42.3 06/04/2021     Screening Lab Results  Component Value Date   SARSCOV2NAA NEGATIVE 05/08/2021   MRSAPCR NEGATIVE 01/19/2018   HIV Non Reactive 05/10/2021     Cancer No results found for: "CEA", "CA125", "LABCA2"   Allergens No results found for: "ALMOND", "APPLE", "ASPARAGUS", "AVOCADO", "BANANA", "BARLEY", "BASIL", "BAYLEAF", "GREENBEAN", "LIMABEAN", "WHITEBEAN", "BEEFIGE", "REDBEET", "BLUEBERRY", "BROCCOLI", "CABBAGE", "MELON", "CARROT", "CASEIN", "CASHEWNUT", "CAULIFLOWER", "CELERY"     Note: Lab results reviewed.   Meds   Current Outpatient Medications:    busPIRone (BUSPAR) 5 MG tablet, Take 1 tablet (5 mg total) by mouth 2 (two) times daily. For anxiety, Disp: 60 tablet, Rfl: 0   cetirizine (ZYRTEC) 10 MG tablet, TAKE 1 TABLET BY MOUTH EVERY DAY AS NEEDED FOR ALLERGY, Disp: 90 tablet, Rfl: 1   cyclobenzaprine (FLEXERIL) 5 MG tablet, TAKE 1 TABLET (5 MG TOTAL) BY MOUTH 2 (TWO) TIMES DAILY AS NEEDED (MIGRAINES)., Disp: 30 tablet, Rfl: 0   DULoxetine (CYMBALTA) 30 MG capsule, Take 30 mg by mouth daily., Disp: , Rfl:    DULoxetine (CYMBALTA) 60 MG capsule, Take 1 capsule (60 mg total) by mouth daily. For anxiety and pain., Disp: 90 capsule, Rfl: 1   ferrous sulfate 325 (65 FE) MG tablet, Take 1 tablet (325 mg total) by mouth every other day., Disp: 30 tablet, Rfl: 0   fluticasone (FLONASE) 50 MCG/ACT nasal spray,  Place 1 spray into both nostrils 2 (two) times daily., Disp: 16 g, Rfl: 0   levothyroxine (SYNTHROID) 88 MCG tablet, TAKE 1 TABLET BY MOUTH EVERY MORNING ON EMPTY STOMACH WITH WATER ONLY-NO FOOD OR OTHER MED FOR 30MIN, Disp: 90 tablet, Rfl: 1   oxybutynin (DITROPAN-XL) 5 MG 24 hr tablet, TAKE 1 TABLET (5 MG TOTAL) BY MOUTH AT BEDTIME. FOR BLADDER INCONTINENCE., Disp: 90 tablet, Rfl: 2   pantoprazole (PROTONIX) 40 MG tablet, Take 1 tablet (40 mg total) by mouth daily., Disp: 30 tablet, Rfl: 0   SUMAtriptan (IMITREX) 50 MG tablet, TAKE 1 TABLET BY MOUTH AT MIGRAINE ONSET. MAY REPEAT WITH ANOTHER TABLET 2 HOURS LATER IF NEEDED., Disp: 9 tablet, Rfl: 0   topiramate (TOPAMAX) 100 MG tablet, TAKE 1 TABLET (100 MG TOTAL) BY MOUTH AT BEDTIME. FOR MIGRAINE PREVENTION., Disp: 90 tablet, Rfl: 2   traMADol (ULTRAM) 50 MG tablet, Take 1 tablet (50 mg total) by mouth every 12 (twelve) hours as needed., Disp: 60 tablet, Rfl: 2   traZODone (DESYREL) 50 MG tablet, Take 3 tablets (150 mg total) by mouth at bedtime. For sleep., Disp: 270 tablet, Rfl: 2  ROS  Constitutional: Denies any fever or chills Gastrointestinal: No reported hemesis, hematochezia, vomiting, or acute GI distress Musculoskeletal: Denies any acute onset joint swelling, redness,  loss of ROM, or weakness Neurological: No reported episodes of acute onset apraxia, aphasia, dysarthria, agnosia, amnesia, paralysis, loss of coordination, or loss of consciousness  Allergies  Ms. Chretien is allergic to penicillins, nsaids, gabapentin, pregabalin, and sulfa antibiotics.  [place]  Drug: Ms. Roes  reports no history of drug use. Alcohol:  reports that she does not currently use alcohol. Tobacco:  reports that she has never smoked. She has never used smokeless tobacco. Medical:  has a past medical history of Acquired pyloric stricture, Bleeding duodenal ulcer, Chronic esophagogastric ulcer, Depression, Duodenal obstruction, Gastric outlet obstruction  (01/19/2018), GERD (gastroesophageal reflux disease), Hair loss (09/22/2018), History of stomach ulcers, Hypothyroidism, Incisional hernia, without obstruction or gangrene, Intractable vomiting, Kidney stones, Migraine, Migraine headache, Neuropathy, and Side pain (03/30/2018). Surgical: Ms. Geisel  has a past surgical history that includes Abdominal hysterectomy (2004); Cholecystectomy; Tooth extraction; Esophagogastroduodenoscopy (egd) with propofol (N/A, 01/12/2018); laparotomy (N/A, 01/20/2018); Esophagogastroduodenoscopy (egd) with propofol (N/A, 01/25/2018); Colonoscopy with propofol (N/A, 02/27/2018); Esophagogastroduodenoscopy (egd) with propofol (N/A, 02/27/2018); Incisional hernia repair (N/A, 11/23/2018); Insertion of mesh (N/A, 11/23/2018); and Esophagogastroduodenoscopy (egd) with propofol (N/A, 05/09/2021). Family: family history includes Alcohol abuse in her maternal grandmother and sister; Bipolar disorder in her sister; Breast cancer (age of onset: 28) in her mother; Cancer in her mother; Lung cancer in her father; Stroke in her maternal grandmother and paternal grandmother.  Constitutional Exam  General appearance: Well nourished, well developed, and well hydrated. In no apparent acute distress Vitals:   09/17/21 1039  BP: 115/66  Pulse: 95  Resp: 16  Temp: (!) 96 F (35.6 C)  TempSrc: Temporal  SpO2: 99%  Weight: 104 lb (47.2 kg)  Height: _0  (1.499 m)   BMI Assessment: Estimated body mass index is 21.01 kg/m as calculated from the following:   Height as of this encounter: _1  (1.499 m).   Weight as of this encounter: 104 lb (47.2 kg).  BMI interpretation table: BMI level Category Range association with higher incidence of chronic pain  <18 kg/m2 Underweight   18.5-24.9 kg/m2 Ideal body weight   25-29.9 kg/m2 Overweight Increased incidence by 20%  30-34.9 kg/m2 Obese (Class I) Increased incidence by 68%  35-39.9 kg/m2 Severe obesity (Class II) Increased incidence by 136%   >40 kg/m2 Extreme obesity (Class III) Increased incidence by 254%   Patient's current BMI Ideal Body weight  Body mass index is 21.01 kg/m. Patient must be at least 60 in tall to calculate ideal body weight   BMI Readings from Last 4 Encounters:  09/17/21 21.01 kg/m  09/17/21 21.11 kg/m  09/02/21 21.81 kg/m  05/18/21 23.48 kg/m   Wt Readings from Last 4 Encounters:  09/17/21 104 lb (47.2 kg)  09/17/21 104 lb 8 oz (47.4 kg)  09/02/21 108 lb (49 kg)  05/18/21 116 lb 4 oz (52.7 kg)    Psych/Mental status: Alert, oriented x 3 (person, place, & time)       Eyes: PERLA Respiratory: No evidence of acute respiratory distress  Cervical Spine Exam  Skin & Axial Inspection: No masses, redness, edema, swelling, or associated skin lesions Alignment: Symmetrical Functional ROM: Unrestricted ROM      Stability: No instability detected Muscle Tone/Strength: Functionally intact. No obvious neuro-muscular anomalies detected. Sensory (Neurological): Unimpaired Palpation: No palpable anomalies                    Upper Extremity (UE) Exam      Side: Right upper extremity   Side: Left upper  extremity   Skin & Extremity Inspection: Skin color, temperature, and hair growth are WNL. No peripheral edema or cyanosis. No masses, redness, swelling, asymmetry, or associated skin lesions. No contractures.   Skin & Extremity Inspection: Skin color, temperature, and hair growth are WNL. No peripheral edema or cyanosis. No masses, redness, swelling, asymmetry, or associated skin lesions. No contractures.   Functional ROM: Unrestricted ROM           Functional ROM: Unrestricted ROM           Muscle Tone/Strength: Functionally intact. No obvious neuro-muscular anomalies detected.     Muscle Tone/Strength: Functionally intact. No obvious neuro-muscular anomalies detected.   Sensory (Neurological): Unimpaired           Sensory (Neurological): Unimpaired           Palpation: No palpable anomalies                Palpation: No palpable anomalies               Provocative Test(s):  Phalen's test: deferred Tinel's test: deferred Apley's scratch test (touch opposite shoulder):  Action 1 (Across chest): deferred Action 2 (Overhead): deferred Action 3 (LB reach): deferred     Provocative Test(s):  Phalen's test: deferred Tinel's test: deferred Apley's scratch test (touch opposite shoulder):  Action 1 (Across chest): deferred Action 2 (Overhead): deferred Action 3 (LB reach): deferred       Thoracic Spine Area Exam  Skin & Axial Inspection: No masses, redness, or swelling Alignment: Symmetrical Functional ROM: Unrestricted ROM Stability: No instability detected Muscle Tone/Strength: Functionally intact. No obvious neuro-muscular anomalies detected. Sensory (Neurological): Unimpaired   Lumbar Exam  Skin & Axial Inspection: No masses, redness, or swelling Alignment: Symmetrical Functional ROM: Unrestricted ROM       Stability: No instability detected Muscle Tone/Strength: Functionally intact. No obvious neuro-muscular anomalies detected. Sensory (Neurological): Unimpaired   *(Flexion, ABduction and External Rotation)   Gait & Posture Assessment  Ambulation: Unassisted Gait: Relatively normal for age and body habitus Posture: WNL    Lower Extremity Exam      Side: Right lower extremity   Side: Left lower extremity  Stability: No instability observed           Stability: No instability observed          Skin & Extremity Inspection: Skin color, temperature, and hair growth are WNL. No peripheral edema or cyanosis. No masses, redness, swelling, asymmetry, or associated skin lesions. No contractures.   Skin & Extremity Inspection: Skin color, temperature, and hair growth are WNL. No peripheral edema or cyanosis. No masses, redness, swelling, asymmetry, or associated skin lesions. No contractures.  Functional ROM: Pain restricted ROM ankle/foot           Functional ROM: Pain restricted ROM  ankle/foot          Muscle Tone/Strength: Functionally intact. No obvious neuro-muscular anomalies detected.   Muscle Tone/Strength: Functionally intact. No obvious neuro-muscular anomalies detected.  Sensory (Neurological): Neuropathic pain pattern         Sensory (Neurological): Neuropathic pain pattern        DTR: Patellar: deferred today Achilles: deferred today Plantar: deferred today   DTR: Patellar: deferred today Achilles: deferred today Plantar: deferred today  Palpation: No palpable anomalies   Palpation: No palpable anomalies    Assessment & Plan  Primary Diagnosis & Pertinent Problem List: The primary encounter diagnosis was Plantar fascial fibromatosis of both feet. Diagnoses of Idiopathic peripheral neuropathy,  Bilateral foot pain, PTSD (post-traumatic stress disorder), Bipolar I disorder, most recent episode depressed ([place]), Pain management contract signed, and Chronic pain syndrome were also pertinent to this visit.  Visit Diagnosis: 1. Plantar fascial fibromatosis of both feet   2. Idiopathic peripheral neuropathy   3. Bilateral foot pain   4. PTSD (post-traumatic stress disorder)   5. Bipolar I disorder, most recent episode depressed ([place])   6. Pain management contract signed   7. Chronic pain syndrome    Problems updated and reviewed during this visit: Problem  Pain Management Contract Signed    Plan of Care  Pharmacotherapy (Medications Ordered): Meds ordered this encounter  Medications   traMADol (ULTRAM) 50 MG tablet    Sig: Take 1 tablet (50 mg total) by mouth every 12 (twelve) hours as needed.    Dispense:  60 tablet    Refill:  2    Provider-requested follow-up: Return in about 3 months (around 12/18/2021) for Medication Management, in person.  I spent a total of 30 minutes reviewing chart data, face-to-face evaluation with the patient, counseling and coordination of care as detailed above.   Recent Visits No visits were found meeting these  conditions. Showing recent visits within past 90 days and meeting all other requirements Today's Visits Date Type Provider Dept  09/17/21 Office Visit Gillis Santa, MD [facility]  Showing today's visits and meeting all other requirements Future Appointments Date Type Provider Dept  12/10/21 Appointment Gillis Santa, MD [facility]  Showing future appointments within next 90 days and meeting all other requirements  Primary Care Physician: Pleas Koch, NP Note by: Gillis Santa, MD Date: 09/17/2021; Time: 1:07 PM

## 2021-09-17 NOTE — Assessment & Plan Note (Signed)
Rib xray to r/o fracture Pending results  If any sudden onset sob or increasing pain go to er

## 2021-09-17 NOTE — Patient Instructions (Signed)
Sign pain contract Follow up in 3 months

## 2021-09-17 NOTE — Assessment & Plan Note (Addendum)
Urine culture and poct urinalysis ordered and pending results  No urinary symptoms but want to assess for kidney stone due to acute pain.   Pending results to determine treatment

## 2021-09-18 LAB — URINE CULTURE
MICRO NUMBER:: 13643097
Result:: NO GROWTH
SPECIMEN QUALITY:: ADEQUATE

## 2021-09-18 NOTE — Telephone Encounter (Signed)
I am not willing to give her anything more than tramadol or muscle relaxer for pain. If she is in that much pain she might need to go to ER.   Otherwise for now we are pending referrals for CT imaging.

## 2021-09-18 NOTE — Telephone Encounter (Signed)
Patient called back to follow up because shes in a lot of pain, She said she was only prescribed muscle relaxers and she needs something for pain. She said the muscle relaxers and the tramadol is not helping

## 2021-09-21 ENCOUNTER — Telehealth: Payer: Self-pay | Admitting: Primary Care

## 2021-09-21 ENCOUNTER — Other Ambulatory Visit: Payer: Self-pay

## 2021-09-21 ENCOUNTER — Other Ambulatory Visit: Payer: Self-pay | Admitting: Primary Care

## 2021-09-21 ENCOUNTER — Emergency Department
Admission: EM | Admit: 2021-09-21 | Discharge: 2021-09-21 | Disposition: A | Discharge status: Home / Self Care | Payer: Worker's Compensation | Attending: Emergency Medicine | Admitting: Emergency Medicine

## 2021-09-21 ENCOUNTER — Emergency Department: Payer: 59

## 2021-09-21 DIAGNOSIS — X501XXA Overexertion from prolonged static or awkward postures, initial encounter: Secondary | ICD-10-CM | POA: Diagnosis not present

## 2021-09-21 DIAGNOSIS — S3992XA Unspecified injury of lower back, initial encounter: Secondary | ICD-10-CM | POA: Diagnosis present

## 2021-09-21 DIAGNOSIS — R109 Unspecified abdominal pain: Secondary | ICD-10-CM

## 2021-09-21 DIAGNOSIS — S29012A Strain of muscle and tendon of back wall of thorax, initial encounter: Secondary | ICD-10-CM | POA: Insufficient documentation

## 2021-09-21 DIAGNOSIS — R10A0 Flank pain, unspecified side: Secondary | ICD-10-CM

## 2021-09-21 DIAGNOSIS — M79671 Pain in right foot: Secondary | ICD-10-CM

## 2021-09-21 DIAGNOSIS — T148XXA Other injury of unspecified body region, initial encounter: Secondary | ICD-10-CM

## 2021-09-21 DIAGNOSIS — Y9389 Activity, other specified: Secondary | ICD-10-CM | POA: Insufficient documentation

## 2021-09-21 LAB — CBC WITH DIFFERENTIAL/PLATELET
Abs Immature Granulocytes: 0.02 10*3/uL (ref 0.00–0.07)
Basophils Absolute: 0 10*3/uL (ref 0.0–0.1)
Basophils Relative: 1 %
Eosinophils Absolute: 0.1 10*3/uL (ref 0.0–0.5)
Eosinophils Relative: 2 %
HCT: 25.2 % — ABNORMAL LOW (ref 36.0–46.0)
Hemoglobin: 7.6 g/dL — ABNORMAL LOW (ref 12.0–15.0)
Immature Granulocytes: 0 %
Lymphocytes Relative: 30 %
Lymphs Abs: 1.5 10*3/uL (ref 0.7–4.0)
MCH: 29.7 pg (ref 26.0–34.0)
MCHC: 30.2 g/dL (ref 30.0–36.0)
MCV: 98.4 fL (ref 80.0–100.0)
Monocytes Absolute: 0.3 10*3/uL (ref 0.1–1.0)
Monocytes Relative: 6 %
Neutro Abs: 3 10*3/uL (ref 1.7–7.7)
Neutrophils Relative %: 61 %
Platelets: 422 10*3/uL — ABNORMAL HIGH (ref 150–400)
RBC: 2.56 MIL/uL — ABNORMAL LOW (ref 3.87–5.11)
RDW: 15.6 % — ABNORMAL HIGH (ref 11.5–15.5)
WBC: 5 10*3/uL (ref 4.0–10.5)
nRBC: 0 % (ref 0.0–0.2)

## 2021-09-21 LAB — URINALYSIS, ROUTINE W REFLEX MICROSCOPIC
Bilirubin Urine: NEGATIVE
Glucose, UA: NEGATIVE mg/dL
Hgb urine dipstick: NEGATIVE
Ketones, ur: NEGATIVE mg/dL
Nitrite: NEGATIVE
Protein, ur: NEGATIVE mg/dL
Specific Gravity, Urine: >1.046 — ABNORMAL HIGH (ref 1.005–1.030)
pH: 5 (ref 5.0–8.0)

## 2021-09-21 LAB — COMPREHENSIVE METABOLIC PANEL
ALT: 12 U/L (ref 0–44)
AST: 19 U/L (ref 15–41)
Albumin: 2.6 g/dL — ABNORMAL LOW (ref 3.5–5.0)
Alkaline Phosphatase: 61 U/L (ref 38–126)
Anion gap: 4 — ABNORMAL LOW (ref 5–15)
BUN: 12 mg/dL (ref 6–20)
CO2: 26 mmol/L (ref 22–32)
Calcium: 8.1 mg/dL — ABNORMAL LOW (ref 8.9–10.3)
Chloride: 110 mmol/L (ref 98–111)
Creatinine, Ser: 0.72 mg/dL (ref 0.44–1.00)
GFR, Estimated: >60 mL/min (ref >60)
Glucose, Bld: 95 mg/dL (ref 70–99)
Potassium: 3.8 mmol/L (ref 3.5–5.1)
Sodium: 140 mmol/L (ref 135–145)
Total Bilirubin: 0.1 mg/dL — ABNORMAL LOW (ref 0.3–1.2)
Total Protein: 4.8 g/dL — ABNORMAL LOW (ref 6.5–8.1)

## 2021-09-21 MED ORDER — MORPHINE SULFATE (PF) 4 MG/ML IV SOLN
4.0000 mg | Freq: Once | INTRAVENOUS | Status: AC
  Administered 2021-09-21: 4 mg via INTRAVENOUS
  Filled 2021-09-21: qty 1

## 2021-09-21 MED ORDER — FENTANYL CITRATE PF 50 MCG/ML IJ SOSY
25.0000 ug | PREFILLED_SYRINGE | Freq: Once | INTRAMUSCULAR | Status: AC
  Administered 2021-09-21: 25 ug via INTRAVENOUS
  Filled 2021-09-21: qty 1

## 2021-09-21 MED ORDER — KETOROLAC TROMETHAMINE 30 MG/ML IJ SOLN: 30.0000 mg | Freq: Once | INTRAMUSCULAR | Status: DC

## 2021-09-21 MED ORDER — CYCLOBENZAPRINE HCL 10 MG PO TABS: 10.0000 mg | ORAL_TABLET | Freq: Once | ORAL | Status: DC

## 2021-09-21 MED ORDER — ONDANSETRON HCL 4 MG/2ML IJ SOLN
4.0000 mg | Freq: Once | INTRAMUSCULAR | Status: AC
  Administered 2021-09-21: 4 mg via INTRAVENOUS
  Filled 2021-09-21: qty 2

## 2021-09-21 MED ORDER — HYDROCODONE-ACETAMINOPHEN 5-325 MG PO TABS: 1.0000 | ORAL_TABLET | Freq: Four times a day (QID) | ORAL | 0 refills | Status: DC | PRN

## 2021-09-21 MED ORDER — PREDNISONE 10 MG (21) PO TBPK: ORAL_TABLET | ORAL | 0 refills | Status: DC

## 2021-09-21 MED ORDER — IOHEXOL 300 MG/ML  SOLN
100.0000 mL | Freq: Once | INTRAMUSCULAR | Status: AC | PRN
  Administered 2021-09-21: 100 mL via INTRAVENOUS

## 2021-09-21 NOTE — Telephone Encounter (Signed)
Patient called and stated that she is at the hospital and wanted to know how she can get workers comp. Call back [phone]

## 2021-09-21 NOTE — Discharge Instructions (Addendum)
Follow up with your regular doctor if not improving in 3 days Follow up with the ER if you are worsening It is very important that you eat regularly especially while taking the medication

## 2021-09-21 NOTE — ED Provider Notes (Signed)
[facility] Provider Note    Event Date/Time   First MD Initiated Contact with Patient 09/21/21 1406     (approximate)   History   Back Pain   HPI  Tanya Nguyen is a 56 y.o. female presents emergency department complaining of back pain after leaning over into a wash machine and feeling a pop.  Patient states that she has had severe pain.  Had an x-ray at her doctor's office which was negative.  States they only gave her muscle relaxers and she has been really hurting.  In discussion with the patient's healthcare assistant Synetta Fail, she states patient's been having a very difficult time and that her mental health has been deteriorating.  States that her depression has worsened and she is not willing to eat and drink.  States that she is been complaining of pain in the back.  Was planning on checking herself in the psych after her mother deals with her breast cancer issues.      Physical Exam   Triage Vital Signs: ED Triage Vitals [09/21/21 1332]  Enc Vitals Group     BP 119/69     Pulse Rate (!) 103     Resp 16     Temp 97.9 F (36.6 C)     Temp Source Oral     SpO2 100 %     Weight 100 lb (45.4 kg)     Height 4\' 11"  (1.499 m)     Head Circumference      Peak Flow      Pain Score 10     Pain Loc      Pain Edu?      Excl. in GC?     Most recent vital signs: Vitals:   09/21/21 1332 09/21/21 1642  BP: 119/69 120/70  Pulse: (!) 103 99  Resp: 16 16  Temp: 97.9 F (36.6 C) 98 F (36.7 C)  SpO2: 100% 100%     General: Awake, no distress.   CV:  Good peripheral perfusion. regular rate and  rhythm Resp:  Normal effort.  Abd:  No distention.  Tender along the flank. Other:      ED Results / Procedures / Treatments   Labs (all labs ordered are listed, but only abnormal results are displayed) Labs Reviewed  CBC WITH DIFFERENTIAL/PLATELET - Abnormal; Notable for the following components:      Result Value   RBC 2.56 (*)    Hemoglobin 7.6  (*)    HCT 25.2 (*)    RDW 15.6 (*)    Platelets 422 (*)    All other components within normal limits  COMPREHENSIVE METABOLIC PANEL - Abnormal; Notable for the following components:   Calcium 8.1 (*)    Total Protein 4.8 (*)    Albumin 2.6 (*)    Total Bilirubin 0.1 (*)    Anion gap 4 (*)    All other components within normal limits  URINALYSIS, ROUTINE W REFLEX MICROSCOPIC - Abnormal; Notable for the following components:   Color, Urine YELLOW (*)    APPearance CLEAR (*)    Specific Gravity, Urine >1.046 (*)    Leukocytes,Ua TRACE (*)    Bacteria, UA RARE (*)    All other components within normal limits     EKG     RADIOLOGY CT abdomen/pelvis IV contrast    PROCEDURES:   Procedures   MEDICATIONS ORDERED IN ED: Medications  morphine (PF) 4 MG/ML injection 4 mg (4 mg Intravenous  Given 09/21/21 1538)  ondansetron (ZOFRAN) injection 4 mg (4 mg Intravenous Given 09/21/21 1537)  iohexol (OMNIPAQUE) 300 MG/ML solution 100 mL (100 mLs Intravenous Contrast Given 09/21/21 1624)  fentaNYL (SUBLIMAZE) injection 25 mcg (25 mcg Intravenous Given 09/21/21 1827)     IMPRESSION / MDM / ASSESSMENT AND PLAN / ED COURSE  I reviewed the triage vital signs and the nursing notes.                              Differential diagnosis includes, but is not limited to, compression fracture, muscle strain, kidney stone, bowel obstruction, GI bleed  Patient's presentation is most consistent with acute presentation with potential threat to life or bodily function.   Ordered labs and imaging  Review of the old charts does not show any acute abnormality of her x-ray of her lumbar spine.  CT abdomen/pelvis was independently reviewed and confirmed by me to have dilated loops of bowel.  Radiologist comments there fluid-filled does not see an obstruction.  Labs are concerning for anemia her hemoglobin has dropped to 7.6 from 13.9 in 3 months, her albumin is also low  Hemoccult is negative  UA  is negative  Did discuss this with Dr. Lexine Baton.  She agrees patient could be discharged.  Her hgb has been low before and since the patient is not eating well this may be contributing to her chronic problems. Encourage her to f/u with psychiatry, orthopedics or her regular doctor Talked with family member via the patient's phone with her present and with her permission about plan of action.  Medication was sent to the pharmacy, sterapred and vicodin #[id number]; discharged in stable condition   FINAL CLINICAL IMPRESSION(S) / ED DIAGNOSES   Final diagnoses:  Flank pain  Muscle strain     Rx / DC Orders   ED Discharge Orders          Ordered    predniSONE (STERAPRED UNI-PAK 21 TAB) 10 MG (21) TBPK tablet        09/21/21 1905    HYDROcodone-acetaminophen (NORCO/VICODIN) 5-325 MG tablet  Every 6 hours PRN        09/21/21 1905             Note:  This document was prepared using Dragon voice recognition software and may include unintentional dictation errors.    Faythe Ghee, PA-C 09/21/21 1910    Merwyn Katos, MD 09/22/21 1539

## 2021-09-21 NOTE — Telephone Encounter (Signed)
Spoke to patient by telephone and was advised that she is at the hospital now and they just gave her some morphine for her pain. Patient was advised that she needs to reach out to her employer regarding worker's comp. Patient stated that she has contacted her employer and was advised that they will take care of it for her.

## 2021-09-21 NOTE — Progress Notes (Signed)
Urine was negative for infection.

## 2021-09-21 NOTE — ED Triage Notes (Signed)
Pt states she hurt her right mid back last Sunday while at work and was seen at [facility] on Friday and had xray but would not give her anything for pain until they get a CT. 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

## 2021-09-22 ENCOUNTER — Telehealth: Payer: Self-pay | Admitting: Student in an Organized Health Care Education/Training Program

## 2021-09-22 NOTE — Telephone Encounter (Signed)
Patient went to ED last night for pain in her rib area. Was given a script for prednisone and Hydrocone. She is going to pick up the prenisone but thinks she cant get the Hydrocone due to contract with [place]. Please call patient and advise.

## 2021-09-22 NOTE — Telephone Encounter (Signed)
Patient stated that [facility] needs [facility] so patient can pick up meds.

## 2021-09-22 NOTE — Telephone Encounter (Signed)
PA has been done via [facility]

## 2021-09-22 NOTE — Telephone Encounter (Signed)
Patient informed that is not a violation of the med agreement to receive opioids from another physician for an acute episode.

## 2021-09-22 NOTE — Telephone Encounter (Signed)
Noted  

## 2021-09-24 ENCOUNTER — Other Ambulatory Visit: Payer: Self-pay | Admitting: Primary Care

## 2021-09-24 DIAGNOSIS — F419 Anxiety disorder, unspecified: Secondary | ICD-10-CM

## 2021-09-25 ENCOUNTER — Telehealth: Payer: Self-pay | Admitting: *Deleted

## 2021-09-25 NOTE — Telephone Encounter (Signed)
States she received a script for Hydrocodone from another provider for an injury. Pharmacy will not fill it because she received Tramadol from our office. I called [facility], informed them that the Hydrocodone may be filled.

## 2021-09-28 ENCOUNTER — Ambulatory Visit: Admission: RE | Admit: 2021-09-28 | Payer: 59 | Source: Ambulatory Visit

## 2021-09-28 ENCOUNTER — Telehealth: Payer: Self-pay | Admitting: Primary Care

## 2021-09-28 NOTE — Telephone Encounter (Signed)
Thank you! Noted.  Will make reminder to check on this with patient.

## 2021-09-28 NOTE — Telephone Encounter (Signed)
Patient called stating she will not be able to have the CT done today due her insurance not covering it because they are going out of business. She stated she will get new insurance and have it done then. Thank you!

## 2021-09-30 ENCOUNTER — Telehealth: Payer: Self-pay | Admitting: Primary Care

## 2021-09-30 DIAGNOSIS — R1310 Dysphagia, unspecified: Secondary | ICD-10-CM

## 2021-09-30 NOTE — Telephone Encounter (Signed)
Patient called and said that she is having problems swallowing her pills, she said it makes her gag and makes her throw up. Lately she hasnt been able to take all of her medications. She wanted to schedule an appt to see you but the next available appt is not until August the 8th which is when she already has an appt with you. What do you advise? Call back is [phone]

## 2021-09-30 NOTE — Telephone Encounter (Signed)
Have her name the pills that cause her to gag so that I can see if they can be broken in half.    We can discuss further during her upcoming visit.

## 2021-10-01 NOTE — Telephone Encounter (Signed)
Called and lvm for patient to call back. 

## 2021-10-01 NOTE — Telephone Encounter (Signed)
If this is a swallowing issue then she may need to see her GI doctor. Has she tried contacting her GI doctor?  I am happy to see her sooner if she cannot get in with her GI doctor.

## 2021-10-01 NOTE — Telephone Encounter (Signed)
Patient returned call back,would like a phone call.

## 2021-10-01 NOTE — Telephone Encounter (Signed)
Called patient states that she is not able to take any meds at all. She is making herself try to take her levothyroxine and buspirone but not able to do every day. She is taking her vitamins she was able to get gummies to take that. She does not have any trouble eating or drinking. No swelling in throat or mouth. Started having problems with it about 2-3 weeks ago. Do you want me to move her appointment up for follow up?

## 2021-10-02 ENCOUNTER — Ambulatory Visit: Payer: 59 | Admitting: Primary Care

## 2021-10-02 NOTE — Telephone Encounter (Signed)
Patient notified as instructed by telephone and verbalized understanding. Patient stated that she will reach out to her GI doctor for an appointment. Patient stated that she has an upcoming appointment scheduled with Allie Bossier NP.

## 2021-10-02 NOTE — Telephone Encounter (Signed)
Left message on voicemail for patient to call the office back. 

## 2021-10-05 NOTE — Telephone Encounter (Signed)
Pt needs a new referral.

## 2021-10-05 NOTE — Telephone Encounter (Signed)
Her old GI doctor told her she would have to set up with a new GI doctor but didn't give any info. She said she called GI at [facility] and tried to set up an appt with them and they told her she would need a referral so she asked if it could be sent to them. Please advise.

## 2021-10-06 ENCOUNTER — Ambulatory Visit
Admission: RE | Admit: 2021-10-06 | Discharge: 2021-10-06 | Disposition: A | Discharge status: Home / Self Care | Payer: 59 | Source: Ambulatory Visit | Attending: Family | Admitting: Family

## 2021-10-06 DIAGNOSIS — R911 Solitary pulmonary nodule: Secondary | ICD-10-CM | POA: Insufficient documentation

## 2021-10-06 DIAGNOSIS — R9389 Abnormal findings on diagnostic imaging of other specified body structures: Secondary | ICD-10-CM | POA: Diagnosis not present

## 2021-10-06 MED ORDER — IOHEXOL 300 MG/ML  SOLN
75.0000 mL | Freq: Once | INTRAMUSCULAR | Status: AC | PRN
  Administered 2021-10-06: 75 mL via INTRAVENOUS

## 2021-10-06 NOTE — Telephone Encounter (Signed)
Noted  Referral placed.

## 2021-10-06 NOTE — Addendum Note (Signed)
Addended by: Pleas Koch on: 10/06/2021 01:33 PM   Modules accepted: Orders

## 2021-10-07 ENCOUNTER — Telehealth: Payer: Self-pay | Admitting: Primary Care

## 2021-10-07 DIAGNOSIS — G43111 Migraine with aura, intractable, with status migrainosus: Secondary | ICD-10-CM

## 2021-10-07 MED ORDER — SUMATRIPTAN SUCCINATE 50 MG PO TABS: ORAL_TABLET | ORAL | 0 refills | Status: DC

## 2021-10-07 NOTE — Telephone Encounter (Signed)
Patient called in and asked could she receive a phone call about the chest xray that she had done on yesterday 10/06/21 ,if the results are back in the next day or two. She has an appointment on August 8,however she does not want to wait until then to discuss her results.

## 2021-10-07 NOTE — Telephone Encounter (Signed)
Noted, appreciate Tabatha's evaluation.  I do not see where I can cancel the referral, so when GI contacts or she can cancel at that point.  Refill sent to pharmacy for Imitrex.

## 2021-10-07 NOTE — Addendum Note (Signed)
Addended by: Pleas Koch on: 10/07/2021 04:23 PM   Modules accepted: Orders

## 2021-10-07 NOTE — Telephone Encounter (Signed)
Called patient let her know that once Lawerance Bach gets results and has chance to review our office will be in touch.   She wanted to let you know she is no longer having issues with swallowing and does not need to see that other doctor.   She is also asking for refill of the Imitrex

## 2021-10-08 NOTE — Telephone Encounter (Signed)
Lvm asking pt to call back.  Need to relay Kate's message.

## 2021-10-08 NOTE — Progress Notes (Signed)
The lung nodule previously seen on CT has resolved.   There is some moderate fluid in the stomach area, and thickened appearance of pylorus. Does pt have any epigastric (mid upper stomach pain?) pain after or before eating? Any acid reflux?   Forde Radon

## 2021-10-09 NOTE — Telephone Encounter (Signed)
Called and spoke with patient. She wants to keep the referral and will schedule an appt when GI calls.

## 2021-10-13 ENCOUNTER — Encounter: Payer: Self-pay | Admitting: Primary Care

## 2021-10-13 ENCOUNTER — Ambulatory Visit (INDEPENDENT_AMBULATORY_CARE_PROVIDER_SITE_OTHER): Payer: 59 | Admitting: Primary Care

## 2021-10-13 ENCOUNTER — Other Ambulatory Visit: Payer: Self-pay | Admitting: Primary Care

## 2021-10-13 DIAGNOSIS — G43709 Chronic migraine without aura, not intractable, without status migrainosus: Secondary | ICD-10-CM

## 2021-10-13 DIAGNOSIS — F330 Major depressive disorder, recurrent, mild: Secondary | ICD-10-CM | POA: Diagnosis not present

## 2021-10-13 DIAGNOSIS — F419 Anxiety disorder, unspecified: Secondary | ICD-10-CM

## 2021-10-13 DIAGNOSIS — R0781 Pleurodynia: Secondary | ICD-10-CM

## 2021-10-13 DIAGNOSIS — R911 Solitary pulmonary nodule: Secondary | ICD-10-CM

## 2021-10-13 DIAGNOSIS — D649 Anemia, unspecified: Secondary | ICD-10-CM | POA: Diagnosis not present

## 2021-10-13 LAB — IBC + FERRITIN
Ferritin: 13.9 ng/mL (ref 10.0–291.0)
Iron: 17 ug/dL — ABNORMAL LOW (ref 42–145)
Saturation Ratios: 5 % — ABNORMAL LOW (ref 20.0–50.0)
TIBC: 340.2 ug/dL (ref 250.0–450.0)
Transferrin: 243 mg/dL (ref 212.0–360.0)

## 2021-10-13 LAB — CBC
HCT: 32.4 % — ABNORMAL LOW (ref 36.0–46.0)
Hemoglobin: 10.3 g/dL — ABNORMAL LOW (ref 12.0–15.0)
MCHC: 31.7 g/dL (ref 30.0–36.0)
MCV: 91.8 fl (ref 78.0–100.0)
Platelets: 446 10*3/uL — ABNORMAL HIGH (ref 150.0–400.0)
RBC: 3.53 Mil/uL — ABNORMAL LOW (ref 3.87–5.11)
RDW: 15.6 % — ABNORMAL HIGH (ref 11.5–15.5)
WBC: 6.8 10*3/uL (ref 4.0–10.5)

## 2021-10-13 MED ORDER — KETOROLAC TROMETHAMINE 60 MG/2ML IM SOLN
60.0000 mg | Freq: Once | INTRAMUSCULAR | Status: AC
  Administered 2021-10-13: 60 mg via INTRAMUSCULAR

## 2021-10-13 MED ORDER — TOPIRAMATE 25 MG PO TABS: 25.0000 mg | ORAL_TABLET | Freq: Every day | ORAL | 0 refills | Status: DC

## 2021-10-13 NOTE — Assessment & Plan Note (Signed)
Deteriorated given stress with her mother's health.  Increase Topamax to 125 mg at bedtime. Continue sumatriptan as needed, encouraged to take full 50 mg tablet at migraine onset rather than 25 mg.  Toradol IM 60 mg provided today. Follow-up if no improvement.

## 2021-10-13 NOTE — Patient Instructions (Signed)
Stop by the lab prior to leaving today. I will notify you of your results once received.   We increased the dose of your Topamax to 125 mg at bedtime for your migraines.  Take the 100 mg tablet at bedtime, take the 25 mg tablet at bedtime.  It was a pleasure to see you today!

## 2021-10-13 NOTE — Assessment & Plan Note (Signed)
Improved per patient.  Continue duloxetine 90 mg daily, buspirone 5 mg BID for anxiety.

## 2021-10-13 NOTE — Assessment & Plan Note (Signed)
Noted on labs from July 2023 with hemoglobin of 7.6.  No acute signs of bleeding per patient. Repeat CBC pending. Add iron studies.  Fortunately, her nutritional intake has improved.

## 2021-10-13 NOTE — Assessment & Plan Note (Signed)
Resolved. Reviewed ED notes from July 2023.

## 2021-10-13 NOTE — Progress Notes (Signed)
Subjective:    Patient ID: Tanya Nguyen, female    DOB: 1965-03-18, 56 y.o.   MRN: [id number]  Migraine     Lenox Ladouceur is a very pleasant 56 y.o. female with a history of GERD, upper GI bleed, hypothyroidism, fatigue, chronic constipation, anxiety/depression, migraines, chronic back pain who presents today for ED follow-up and to discuss migraines.  1) Flank Pain: Evaluated at [facility] ED on 09/21/2021 for acute back pain after leaning over into a washer machine and noticing a "pop".  She underwent plain films of the right unilateral ribs and chest x-ray on 09/17/2021 per Lawerance Bach, NP for the same complaint.  All x-rays were negative for acute process.  An incidental finding of scarring to the right peripheral midlung was noted in CT chest was recommended.  She underwent CT chest on 10/06/2021 which revealed resolve of her right middle lobe nodularity.  She was noted to have aortic atherosclerosis.  During her stay in the ED she underwent lab work which showed a drop in hemoglobin to 7.6 from 13.9 over 3 months.  Albumin was also noted to be low.  UA was negative.  She was discharged home later that day with recommendations for psychiatry and orthopedic follow-up.  She was prescribed oral steroids and narcotics for her back pain.  Today she endorses her right flank/back pain has resolved. She denies rectal and vaginal bleeding. She continues to feel fatigued and tired.   2) Migraines: Currently managed on Topamax 100 mg at bedtime, sumatriptan 50 mg as needed.  She's noticed an increase in her migraines/headaches which are occurring every other day. She is compliant to her Topamax 100 mg HS which has historically helped to reduce headaches. Headaches are occurring to the entire parietal lobe for which she's describing as a "shooting pain". She took 25 mg of sumatriptan this morning at 8 am without resolve yet.   She's been under a lot stress with her mother's health which includes multiple kidney  stones and breast cancer. Her mother will start radiation soon.  3) Anxiety and Depression: Currently managed on duloxetine 90 mg and now buspirone 5 mg BID. Since initiation of buspirone 5 mg BID she's noticed a reduction in her overall anxiety and less tearfulness. She is under a lot of stress with her mother's poor health.   Review of Systems  Constitutional:  Positive for fatigue.  Respiratory:  Negative for shortness of breath.   Cardiovascular:  Negative for chest pain.  Gastrointestinal:  Negative for blood in stool.  Genitourinary:  Negative for vaginal bleeding.  Neurological:  Positive for headaches.  Psychiatric/Behavioral:  The patient is nervous/anxious.        See HPI         Past Medical History:  Diagnosis Date   Acquired pyloric stricture    Bleeding duodenal ulcer    Chronic esophagogastric ulcer    Depression    Duodenal obstruction    Gastric outlet obstruction 01/19/2018   GERD (gastroesophageal reflux disease)    Hair loss 09/22/2018   History of stomach ulcers    Hypothyroidism    Incisional hernia, without obstruction or gangrene    Intractable vomiting    Kidney stones    Migraine    Migraine headache    only1-2x/month since starting topamax   Neuropathy    Side pain 03/30/2018    Social History   Socioeconomic History   Marital status: Married    Spouse name: Not on file   Number of  children: 0   Years of education: 12   Highest education level: High school graduate  Occupational History   Occupation: Homemaker  Tobacco Use   Smoking status: Never   Smokeless tobacco: Never  Vaping Use   Vaping Use: Never used  Substance and Sexual Activity   Alcohol use: Not Currently    Alcohol/week: 0.0 standard drinks of alcohol   Drug use: No   Sexual activity: Yes    Birth control/protection: None  Other Topics Concern   Not on file  Social History Narrative   Lives at home with her husband.   Right-handed.   Rare caffeine.   Social  Determinants of Health   Financial Resource Strain: Not on file  Food Insecurity: Not on file  Transportation Needs: Not on file  Physical Activity: Not on file  Stress: Not on file  Social Connections: Not on file  Intimate Partner Violence: Not on file    Past Surgical History:  Procedure Laterality Date   ABDOMINAL HYSTERECTOMY  2004   partial   CHOLECYSTECTOMY     COLONOSCOPY WITH PROPOFOL N/A 02/27/2018   Procedure: COLONOSCOPY WITH PROPOFOL;  Surgeon: [name], MD;  Location: [place];  Service: Endoscopy;  Laterality: N/A;   ESOPHAGOGASTRODUODENOSCOPY (EGD) WITH PROPOFOL N/A 01/12/2018   Procedure: ESOPHAGOGASTRODUODENOSCOPY (EGD) WITH BIOPSIES;  Surgeon: [name], MD;  Location: [place];  Service: Endoscopy;  Laterality: N/A;   ESOPHAGOGASTRODUODENOSCOPY (EGD) WITH PROPOFOL N/A 01/25/2018   Procedure: ESOPHAGOGASTRODUODENOSCOPY (EGD) WITH PROPOFOL;  Surgeon: Jonathon Bellows, MD;  Location[facility];  Service: Gastroenterology;  Laterality: N/A;   ESOPHAGOGASTRODUODENOSCOPY (EGD) WITH PROPOFOL N/A 02/27/2018   Procedure: ESOPHAGOGASTRODUODENOSCOPY (EGD) WITH PROPOFOL;  Surgeon: [name], MD;  Location: [place];  Service: Endoscopy;  Laterality: N/A;   ESOPHAGOGASTRODUODENOSCOPY (EGD) WITH PROPOFOL N/A 05/09/2021   Procedure: ESOPHAGOGASTRODUODENOSCOPY (EGD) WITH PROPOFOL;  Surgeon: Annamaria Helling, DO;  Location: [place];  Service: Gastroenterology;  Laterality: N/A;   INCISIONAL HERNIA REPAIR N/A 11/23/2018   Procedure: HERNIA REPAIR INCISIONAL;  Surgeon: Olean Ree, MD;  Location: [facility];  Service: General;  Laterality: N/A;   INSERTION OF MESH N/A 11/23/2018   Procedure: INSERTION OF MESH;  Surgeon: Olean Ree, MD;  Location: [facility];  Service: General;  Laterality: N/A;   LAPAROTOMY N/A 01/20/2018   Procedure: EXPLORATORY LAPAROTOMY;  Surgeon: Olean Ree, MD;  Location: [facility];  Service: General;   Laterality: N/A;   TOOTH EXTRACTION      Family History  Problem Relation Age of Onset   Cancer Mother    Breast cancer Mother 69   Lung cancer Father    Alcohol abuse Sister    Bipolar disorder Sister    Alcohol abuse Maternal Grandmother    Stroke Maternal Grandmother    Stroke Paternal Grandmother     Allergies  Allergen Reactions   Penicillins Shortness Of Breath, Diarrhea and Nausea And Vomiting    Has patient had a PCN reaction causing immediate rash, facial/tongue/throat swelling, SOB or lightheadedness with hypotension: yes Has patient had a PCN reaction causing severe rash involving mucus membranes or skin necrosis: no Has patient had a PCN reaction that required hospitalization no Has patient had a PCN reaction occurring within the last 10 years: about 10 years If all of the above answers are "NO", then may proceed with Cephalosporin use.    Nsaids Other (See Comments)    Pt states she is not allergic  Other reaction(s): OTHER Pt states NSAIDS cause "  internal bleeding"   Gabapentin Other (See Comments)    Hair Loss    Pregabalin     Tremors   Sulfa Antibiotics Nausea And Vomiting    Current Outpatient Medications on File Prior to Visit  Medication Sig Dispense Refill   busPIRone (BUSPAR) 5 MG tablet TAKE 1 TABLET (5 MG TOTAL) BY MOUTH 2 (TWO) TIMES DAILY. FOR ANXIETY 180 tablet 0   cetirizine (ZYRTEC) 10 MG tablet TAKE 1 TABLET BY MOUTH EVERY DAY AS NEEDED FOR ALLERGY 90 tablet 1   DULoxetine (CYMBALTA) 30 MG capsule Take 30 mg by mouth daily.     DULoxetine (CYMBALTA) 60 MG capsule TAKE 1 CAPSULE (60 MG TOTAL) BY MOUTH DAILY. FOR ANXIETY AND PAIN. 90 capsule 1   levothyroxine (SYNTHROID) 88 MCG tablet TAKE 1 TABLET BY MOUTH EVERY MORNING ON EMPTY STOMACH WITH WATER ONLY-NO FOOD OR OTHER MED FOR 30MIN 90 tablet 1   oxybutynin (DITROPAN-XL) 5 MG 24 hr tablet TAKE 1 TABLET (5 MG TOTAL) BY MOUTH AT BEDTIME. FOR BLADDER INCONTINENCE. 90 tablet 2   pantoprazole  (PROTONIX) 40 MG tablet Take 1 tablet (40 mg total) by mouth daily. 30 tablet 0   SUMAtriptan (IMITREX) 50 MG tablet Take 1 tablet by mouth at migraine onset.  May repeat in 2 hours if headache persists or recurs.  Do not exceed 2 tablets in 24 hours. 9 tablet 0   tiZANidine (ZANAFLEX) 2 MG tablet Take one po qhs prn muscle spasm Do not take flexeril 20 tablet 0   topiramate (TOPAMAX) 100 MG tablet TAKE 1 TABLET (100 MG TOTAL) BY MOUTH AT BEDTIME. FOR MIGRAINE PREVENTION. 90 tablet 2   traMADol (ULTRAM) 50 MG tablet Take 1 tablet (50 mg total) by mouth every 12 (twelve) hours as needed. 60 tablet 2   traZODone (DESYREL) 50 MG tablet Take 3 tablets (150 mg total) by mouth at bedtime. For sleep. 270 tablet 2   ferrous sulfate 325 (65 FE) MG tablet Take 1 tablet (325 mg total) by mouth every other day. (Patient not taking: Reported on 10/13/2021) 30 tablet 0   fluticasone (FLONASE) 50 MCG/ACT nasal spray Place 1 spray into both nostrils 2 (two) times daily. (Patient not taking: Reported on 10/13/2021) 16 g 0   No current facility-administered medications on file prior to visit.    BP (!) 100/58   Pulse (!) 102   Temp 98.6 F (37 C) (Oral)   Ht '4\' 11"'$  (1.499 m)   Wt 105 lb (47.6 kg)   SpO2 100%   BMI 21.21 kg/m  Objective:   Physical Exam Cardiovascular:     Rate and Rhythm: Normal rate and regular rhythm.  Pulmonary:     Effort: Pulmonary effort is normal.     Breath sounds: Normal breath sounds.  Musculoskeletal:     Cervical back: Neck supple.  Skin:    General: Skin is warm and dry.  Psychiatric:        Mood and Affect: Mood normal.           Assessment & Plan:   Problem List Items Addressed This Visit       Cardiovascular and Mediastinum   Migraines    Deteriorated given stress with her mother's health.  Increase Topamax to 125 mg at bedtime. Continue sumatriptan as needed, encouraged to take full 50 mg tablet at migraine onset rather than 25 mg.  Toradol IM 60 mg  provided today. Follow-up if no improvement.      Relevant  Medications   topiramate (TOPAMAX) 25 MG tablet     Respiratory   Nodule of middle lobe of right lung    Resolved per CT chest from August 2023.         Other   Depression    Improved per patient.  Continue duloxetine 90 mg daily, buspirone 5 mg BID for anxiety.      Low hemoglobin    Noted on labs from July 2023 with hemoglobin of 7.6.  No acute signs of bleeding per patient. Repeat CBC pending. Add iron studies.  Fortunately, her nutritional intake has improved.      Relevant Orders   CBC   IBC + Ferritin   Anxiety    Improving.  Continue buspirone 5 mg BID. Consider dose increase if warranted.      Rib pain on right side    Resolved. Reviewed ED notes from July 2023.          Pleas Koch, NP

## 2021-10-13 NOTE — Assessment & Plan Note (Signed)
Improving.  Continue buspirone 5 mg BID. Consider dose increase if warranted.

## 2021-10-13 NOTE — Addendum Note (Signed)
Addended by: Francella Solian on: 10/13/2021 02:56 PM   Modules accepted: Orders

## 2021-10-13 NOTE — Assessment & Plan Note (Signed)
Resolved per CT chest from August 2023.

## 2021-10-14 ENCOUNTER — Other Ambulatory Visit: Payer: Self-pay | Admitting: Family

## 2021-10-14 DIAGNOSIS — M62838 Other muscle spasm: Secondary | ICD-10-CM

## 2021-10-15 ENCOUNTER — Telehealth: Payer: Self-pay | Admitting: Primary Care

## 2021-10-15 NOTE — Telephone Encounter (Signed)
Left message to return call to our office.  

## 2021-10-15 NOTE — Telephone Encounter (Signed)
Patient is returning a call about lab results. She is requesting a call back today if possible.

## 2021-10-15 NOTE — Telephone Encounter (Signed)
Patient returning a call she received.

## 2021-10-15 NOTE — Telephone Encounter (Signed)
See lab note for documentation.

## 2021-10-16 ENCOUNTER — Other Ambulatory Visit: Payer: Self-pay | Admitting: Primary Care

## 2021-10-16 NOTE — Telephone Encounter (Signed)
See lab results have spoke to patient

## 2021-11-03 ENCOUNTER — Ambulatory Visit (INDEPENDENT_AMBULATORY_CARE_PROVIDER_SITE_OTHER): Payer: 59 | Admitting: Clinical

## 2021-11-03 DIAGNOSIS — F4310 Post-traumatic stress disorder, unspecified: Secondary | ICD-10-CM | POA: Diagnosis not present
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
## 2021-11-03 NOTE — Progress Notes (Unsigned)
                Rual Vermeer, LCSW 

## 2021-11-08 ENCOUNTER — Other Ambulatory Visit: Payer: Self-pay | Admitting: Primary Care

## 2021-11-08 DIAGNOSIS — G43111 Migraine with aura, intractable, with status migrainosus: Secondary | ICD-10-CM

## 2021-11-12 ENCOUNTER — Encounter: Payer: 59 | Admitting: Primary Care

## 2021-11-18 ENCOUNTER — Ambulatory Visit (INDEPENDENT_AMBULATORY_CARE_PROVIDER_SITE_OTHER): Payer: 59 | Admitting: Clinical

## 2021-11-18 DIAGNOSIS — R69 Illness, unspecified: Secondary | ICD-10-CM | POA: Diagnosis not present

## 2021-11-18 DIAGNOSIS — F4310 Post-traumatic stress disorder, unspecified: Secondary | ICD-10-CM

## 2021-11-18 NOTE — Progress Notes (Signed)
[facility] Counselor/Therapist Progress Note  Patient ID: Sameen Leas, MRN: [id number]    Date: 11/18/21  Time Spent: 2:06  pm - 2:30 pm : 24 Minutes  Treatment Type: Individual Therapy.  Reported Symptoms: Patient stated, "it's been a lot better" when clinician inquired about symptoms since last session.   Mental Status Exam: Appearance:  Neat     Behavior: Appropriate  Motor: Normal  Speech/Language:  Clear and Coherent  Affect: Tearful  Mood: depressed  Thought process: tangential  Thought content:   Tangential  Sensory/Perceptual disturbances:   WNL  Orientation: oriented to person and place  Attention: Fair  Concentration: Fair  Memory: [place] of knowledge:  Good  Insight:   Fair  Judgment:  Good  Impulse Control: Good   Risk Assessment: Danger to Self:  No, Patient denied current suicidal ideation and homicidal ideation.  Self-injurious Behavior: No Danger to Others: No Duty to Warn:no Physical Aggression / Violence:No  Access to Firearms a concern: No  Gang Involvement:No   Subjective:   Patient reported recent improvement in her mother's health and improvement in patient's mood in response. Patient reported utilizing her faith as a coping strategy.  Patient stated, "I never wanted to be like that" in response to diagnosis of Unspecified Bipolar Disorder due to family history of Bipolar Disorder. Patient stated, "I always wanted to be depressed not bipolar". Patient reported she is open to referral to therapist that provides trauma focused treatment and a psychiatrist for medication management consult. Patient reported a preference for referrals to providers in [place]. Patient reported recent triggers associated with trauma.     Interventions:  Clinician reviewed diagnoses and treatment recommendations with patient. Clinician provided psycho education related to diagnoses and treatment. Clinician will initiate referral for psychiatry and trauma  focused therapy.   Diagnosis:  Posttraumatic Stress Disorder Unspecified Bipolar and Related Disorder   Plan: Patient will be referred to psychiatry for medication management consult and a therapist who provides trauma focused therapy.                      Katherina Right, LCSW

## 2021-11-23 ENCOUNTER — Ambulatory Visit (INDEPENDENT_AMBULATORY_CARE_PROVIDER_SITE_OTHER): Payer: 59 | Admitting: Primary Care

## 2021-11-23 ENCOUNTER — Encounter: Payer: Self-pay | Admitting: Primary Care

## 2021-11-23 VITALS — BP 90/56 | HR 79 | Temp 97.9°F | Ht 59.5 in | Wt 104.0 lb

## 2021-11-23 DIAGNOSIS — M722 Plantar fascial fibromatosis: Secondary | ICD-10-CM

## 2021-11-23 DIAGNOSIS — D509 Iron deficiency anemia, unspecified: Secondary | ICD-10-CM

## 2021-11-23 DIAGNOSIS — R7303 Prediabetes: Secondary | ICD-10-CM | POA: Diagnosis not present

## 2021-11-23 DIAGNOSIS — G43709 Chronic migraine without aura, not intractable, without status migrainosus: Secondary | ICD-10-CM | POA: Diagnosis not present

## 2021-11-23 DIAGNOSIS — E559 Vitamin D deficiency, unspecified: Secondary | ICD-10-CM

## 2021-11-23 DIAGNOSIS — F3130 Bipolar disorder, current episode depressed, mild or moderate severity, unspecified: Secondary | ICD-10-CM

## 2021-11-23 DIAGNOSIS — M79671 Pain in right foot: Secondary | ICD-10-CM | POA: Diagnosis not present

## 2021-11-23 DIAGNOSIS — E038 Other specified hypothyroidism: Secondary | ICD-10-CM | POA: Diagnosis not present

## 2021-11-23 DIAGNOSIS — Z0000 Encounter for general adult medical examination without abnormal findings: Secondary | ICD-10-CM | POA: Insufficient documentation

## 2021-11-23 DIAGNOSIS — E538 Deficiency of other specified B group vitamins: Secondary | ICD-10-CM

## 2021-11-23 DIAGNOSIS — Z23 Encounter for immunization: Secondary | ICD-10-CM

## 2021-11-23 DIAGNOSIS — G4700 Insomnia, unspecified: Secondary | ICD-10-CM

## 2021-11-23 DIAGNOSIS — M79672 Pain in left foot: Secondary | ICD-10-CM

## 2021-11-23 DIAGNOSIS — R69 Illness, unspecified: Secondary | ICD-10-CM | POA: Diagnosis not present

## 2021-11-23 DIAGNOSIS — K219 Gastro-esophageal reflux disease without esophagitis: Secondary | ICD-10-CM | POA: Diagnosis not present

## 2021-11-23 DIAGNOSIS — N3941 Urge incontinence: Secondary | ICD-10-CM | POA: Diagnosis not present

## 2021-11-23 DIAGNOSIS — K5900 Constipation, unspecified: Secondary | ICD-10-CM

## 2021-11-23 DIAGNOSIS — F4310 Post-traumatic stress disorder, unspecified: Secondary | ICD-10-CM

## 2021-11-23 LAB — CBC
HCT: 35.5 % — ABNORMAL LOW (ref 36.0–46.0)
Hemoglobin: 11.4 g/dL — ABNORMAL LOW (ref 12.0–15.0)
MCHC: 32 g/dL (ref 30.0–36.0)
MCV: 87.2 fl (ref 78.0–100.0)
Platelets: 271 10*3/uL (ref 150.0–400.0)
RBC: 4.07 Mil/uL (ref 3.87–5.11)
RDW: 17.2 % — ABNORMAL HIGH (ref 11.5–15.5)
WBC: 3.8 10*3/uL — ABNORMAL LOW (ref 4.0–10.5)

## 2021-11-23 LAB — IBC + FERRITIN
Ferritin: 33.7 ng/mL (ref 10.0–291.0)
Iron: 52 ug/dL (ref 42–145)
Saturation Ratios: 20.5 % (ref 20.0–50.0)
TIBC: 253.4 ug/dL (ref 250.0–450.0)
Transferrin: 181 mg/dL — ABNORMAL LOW (ref 212.0–360.0)

## 2021-11-23 LAB — TSH: TSH: 1.55 u[IU]/mL (ref 0.35–5.50)

## 2021-11-23 LAB — COMPREHENSIVE METABOLIC PANEL
ALT: 10 U/L (ref 0–35)
AST: 13 U/L (ref 0–37)
Albumin: 3.7 g/dL (ref 3.5–5.2)
Alkaline Phosphatase: 68 U/L (ref 39–117)
BUN: 16 mg/dL (ref 6–23)
CO2: 23 mEq/L (ref 19–32)
Calcium: 8.6 mg/dL (ref 8.4–10.5)
Chloride: 110 mEq/L (ref 96–112)
Creatinine, Ser: 0.92 mg/dL (ref 0.40–1.20)
GFR: 69.29 mL/min (ref >60.00)
Glucose, Bld: 91 mg/dL (ref 70–99)
Potassium: 3.8 mEq/L (ref 3.5–5.1)
Sodium: 143 mEq/L (ref 135–145)
Total Bilirubin: 0.1 mg/dL — ABNORMAL LOW (ref 0.2–1.2)
Total Protein: 5.8 g/dL — ABNORMAL LOW (ref 6.0–8.3)

## 2021-11-23 LAB — LIPID PANEL
Cholesterol: 169 mg/dL (ref 0–200)
HDL: 57.8 mg/dL (ref >39.00)
LDL Cholesterol: 94 mg/dL (ref 0–99)
NonHDL: 111.17
Total CHOL/HDL Ratio: 3
Triglycerides: 87 mg/dL (ref 0.0–149.0)
VLDL: 17.4 mg/dL (ref 0.0–40.0)

## 2021-11-23 LAB — VITAMIN B12: Vitamin B-12: 615 pg/mL (ref 211–911)

## 2021-11-23 LAB — VITAMIN D 25 HYDROXY (VIT D DEFICIENCY, FRACTURES): VITD: 38.39 ng/mL (ref 30.00–100.00)

## 2021-11-23 LAB — HEMOGLOBIN A1C: Hgb A1c MFr Bld: 5.9 % (ref 4.6–6.5)

## 2021-11-23 NOTE — Assessment & Plan Note (Signed)
Controlled.  Continue oxybutynin XL 5 mg daily. Continue to monitor.

## 2021-11-23 NOTE — Assessment & Plan Note (Signed)
Controlled.  No concerns. Continue to monitor.

## 2021-11-23 NOTE — Assessment & Plan Note (Signed)
Stable.  Following with therapy, will be setting up with psychiatry soon.  Continue Cymbalta 60 mg and 30 mg. Continue Trazodone 150 mg HS.

## 2021-11-23 NOTE — Assessment & Plan Note (Addendum)
Improved.  Continue Cymbalta 30 mg and 60 mg doses daily. Continue to monitor.   Following with pain management as well. Continue Tramadol 25 mg PRN for which she uses sparingly.

## 2021-11-23 NOTE — Assessment & Plan Note (Signed)
Controlled.  Continue pantoprazole 40 mg daily. Follows with GI. 

## 2021-11-23 NOTE — Assessment & Plan Note (Signed)
Improved.  Continue Cymbalta 30 mg and 60 mg doses daily. Continue to monitor.

## 2021-11-23 NOTE — Assessment & Plan Note (Signed)
She is now getting set up with psychiatry and a new therapist.  Continue Cymbalta 60 mg daily and 30 mg daily. Continue Trazodone 150 mg HS.

## 2021-11-23 NOTE — Assessment & Plan Note (Signed)
Repeat A1C pending. 

## 2021-11-23 NOTE — Assessment & Plan Note (Signed)
Improved.  Continue Topamax 125 mg daily. Continue sumatriptan 50 mg PRN.  Continue to monitor.

## 2021-11-23 NOTE — Assessment & Plan Note (Signed)
Immunizations UTD. Influenza vaccine provided today. Mammogram UTD. Colonoscopy UTD, follows with GI.  Discussed the importance of a healthy diet and regular exercise in order for weight loss, and to reduce the risk of further co-morbidity.  Exam stable. Labs pending.  Follow up in 1 year for repeat physical.

## 2021-11-23 NOTE — Assessment & Plan Note (Signed)
Continue vitamin D 1000 IU daily. Repeat vitamin D level pending

## 2021-11-23 NOTE — Assessment & Plan Note (Signed)
Repeat labs pending.  Continue ferrous sulfate 325 mg daily.

## 2021-11-23 NOTE — Patient Instructions (Signed)
Stop by the lab prior to leaving today. I will notify you of your results once received.   It was a pleasure to see you today!  Preventive Care 57-57 Years Old, Female Preventive care refers to lifestyle choices and visits with your health care provider that can promote health and wellness. Preventive care visits are also called wellness exams. What can I expect for my preventive care visit? Counseling Your health care provider may ask you questions about your: Medical history, including: Past medical problems. Family medical history. Pregnancy history. Current health, including: Menstrual cycle. Method of birth control. Emotional well-being. Home life and relationship well-being. Sexual activity and sexual health. Lifestyle, including: Alcohol, nicotine or tobacco, and drug use. Access to firearms. Diet, exercise, and sleep habits. Work and work Statistician. Sunscreen use. Safety issues such as seatbelt and bike helmet use. Physical exam Your health care provider will check your: Height and weight. These may be used to calculate your BMI (body mass index). BMI is a measurement that tells if you are at a healthy weight. Waist circumference. This measures the distance around your waistline. This measurement also tells if you are at a healthy weight and may help predict your risk of certain diseases, such as type 2 diabetes and high blood pressure. Heart rate and blood pressure. Body temperature. Skin for abnormal spots. What immunizations do I need?  Vaccines are usually given at various ages, according to a schedule. Your health care provider will recommend vaccines for you based on your age, medical history, and lifestyle or other factors, such as travel or where you work. What tests do I need? Screening Your health care provider may recommend screening tests for certain conditions. This may include: Lipid and cholesterol levels. Diabetes screening. This is done by checking  your blood sugar (glucose) after you have not eaten for a while (fasting). Pelvic exam and Pap test. Hepatitis B test. Hepatitis C test. HIV (human immunodeficiency virus) test. STI (sexually transmitted infection) testing, if you are at risk. Lung cancer screening. Colorectal cancer screening. Mammogram. Talk with your health care provider about when you should start having regular mammograms. This may depend on whether you have a family history of breast cancer. BRCA-related cancer screening. This may be done if you have a family history of breast, ovarian, tubal, or peritoneal cancers. Bone density scan. This is done to screen for osteoporosis. Talk with your health care provider about your test results, treatment options, and if necessary, the need for more tests. Follow these instructions at home: Eating and drinking  Eat a diet that includes fresh fruits and vegetables, whole grains, lean protein, and low-fat dairy products. Take vitamin and mineral supplements as recommended by your health care provider. Do not drink alcohol if: Your health care provider tells you not to drink. You are pregnant, may be pregnant, or are planning to become pregnant. If you drink alcohol: Limit how much you have to 0-1 drink a day. Know how much alcohol is in your drink. In the [place] one drink equals one 12 oz bottle of beer (355 mL), one 5 oz glass of wine (148 mL), or one 1 oz glass of hard liquor (44 mL). Lifestyle Brush your teeth every morning and night with fluoride toothpaste. Floss one time each day. Exercise for at least 30 minutes 5 or more days each week. Do not use any products that contain nicotine or tobacco. These products include cigarettes, chewing tobacco, and vaping devices, such as e-cigarettes. If you need  help quitting, ask your health care provider. Do not use drugs. If you are sexually active, practice safe sex. Use a condom or other form of protection to prevent STIs. If you  do not wish to become pregnant, use a form of birth control. If you plan to become pregnant, see your health care provider for a prepregnancy visit. Take aspirin only as told by your health care provider. Make sure that you understand how much to take and what form to take. Work with your health care provider to find out whether it is safe and beneficial for you to take aspirin daily. Find healthy ways to manage stress, such as: Meditation, yoga, or listening to music. Journaling. Talking to a trusted person. Spending time with friends and family. Minimize exposure to UV radiation to reduce your risk of skin cancer. Safety Always wear your seat belt while driving or riding in a vehicle. Do not drive: If you have been drinking alcohol. Do not ride with someone who has been drinking. When you are tired or distracted. While texting. If you have been using any mind-altering substances or drugs. Wear a helmet and other protective equipment during sports activities. If you have firearms in your house, make sure you follow all gun safety procedures. Seek help if you have been physically or sexually abused. What's next? Visit your health care provider once a year for an annual wellness visit. Ask your health care provider how often you should have your eyes and teeth checked. Stay up to date on all vaccines. This information is not intended to replace advice given to you by your health care provider. Make sure you discuss any questions you have with your health care provider. Document Revised: 08/20/2020 Document Reviewed: 08/20/2020 Elsevier Patient Education  [place].

## 2021-11-23 NOTE — Progress Notes (Signed)
Subjective:    Patient ID: Tanya Nguyen, female    DOB: [birth year], 56 y.o.   MRN: [id number]  HPI  Tanya Nguyen is a very pleasant 56 y.o. female who presents today for complete physical and follow up of chronic conditions.  Immunizations: -Tetanus: 2016 -Influenza: Due today -Covid-19: 1 vaccine -Shingles: Completed Shingrix and Zostavax  Diet: Fair diet.  Exercise: No regular exercise.  Eye exam: Completes annually  Dental exam: Completes semi-annually   Pap Smear: Hysterectomy Mammogram: Completed in May 2023  Colonoscopy: Completed in 2019  BP Readings from Last 3 Encounters:  11/23/21 (!) 90/56  10/13/21 (!) 100/58  09/21/21 120/69       Review of Systems  Constitutional:  Negative for unexpected weight change.  HENT:  Negative for rhinorrhea.   Respiratory:  Negative for cough and shortness of breath.   Cardiovascular:  Negative for chest pain.  Gastrointestinal:  Negative for constipation and diarrhea.  Genitourinary:  Negative for difficulty urinating.  Musculoskeletal:  Positive for arthralgias.  Skin:  Negative for rash.  Allergic/Immunologic: Negative for environmental allergies.  Neurological:  Negative for dizziness and headaches.  Psychiatric/Behavioral:  The patient is not nervous/anxious.          Past Medical History:  Diagnosis Date   Acquired pyloric stricture    Bleeding duodenal ulcer    CAP (community acquired pneumonia) 02/12/2020   Chronic esophagogastric ulcer    Depression    Duodenal obstruction    Gastric outlet obstruction 01/19/2018   GERD (gastroesophageal reflux disease)    Hair loss 09/22/2018   History of stomach ulcers    Hypothyroidism    Incisional hernia, without obstruction or gangrene    Intractable vomiting    Kidney stones    Melena 05/08/2021   Migraine    Migraine headache    only1-2x/month since starting topamax   Neuropathy    Side pain 03/30/2018   Upper GI bleed 05/18/2021    Social History    Socioeconomic History   Marital status: Married    Spouse name: Not on file   Number of children: 0   Years of education: 12   Highest education level: High school graduate  Occupational History   Occupation: Homemaker  Tobacco Use   Smoking status: Never   Smokeless tobacco: Never  Vaping Use   Vaping Use: Never used  Substance and Sexual Activity   Alcohol use: Not Currently    Alcohol/week: 0.0 standard drinks of alcohol   Drug use: No   Sexual activity: Yes    Birth control/protection: None  Other Topics Concern   Not on file  Social History Narrative   Lives at home with her husband.   Right-handed.   Rare caffeine.   Social Determinants of Health   Financial Resource Strain: Not on file  Food Insecurity: Not on file  Transportation Needs: Not on file  Physical Activity: Not on file  Stress: Not on file  Social Connections: Not on file  Intimate Partner Violence: Not on file    Past Surgical History:  Procedure Laterality Date   ABDOMINAL HYSTERECTOMY  2004   partial   CHOLECYSTECTOMY     COLONOSCOPY WITH PROPOFOL N/A 02/27/2018   Procedure: COLONOSCOPY WITH PROPOFOL;  Surgeon: [name], MD;  Location: [facility];  Service: Endoscopy;  Laterality: N/A;   ESOPHAGOGASTRODUODENOSCOPY (EGD) WITH PROPOFOL N/A 01/12/2018   Procedure: ESOPHAGOGASTRODUODENOSCOPY (EGD) WITH BIOPSIES;  Surgeon: [name], MD;  Location: [facility];  Service:  Endoscopy;  Laterality: N/A;   ESOPHAGOGASTRODUODENOSCOPY (EGD) WITH PROPOFOL N/A 01/25/2018   Procedure: ESOPHAGOGASTRODUODENOSCOPY (EGD) WITH PROPOFOL;  Surgeon: Jonathon Bellows, MD;  Location: [facility] END[facility]COPY;  Service: Gastroenterology;  Laterality: N/A;   ESOPHAGOGASTRODUODENOSCOPY (EGD) WITH PROPOFOL N/A 02/27/2018   Procedure: ESOPHAGOGASTRODUODENOSCOPY (EGD) WITH PROPOFOL;  Surgeon: [name], MD;  Location: [place];  Service: Endoscopy;  Laterality: N/A;   ESOPHAGOGASTRODUODENOSCOPY (EGD)  WITH PROPOFOL N/A 05/09/2021   Procedure: ESOPHAGOGASTRODUODENOSCOPY (EGD) WITH PROPOFOL;  Surgeon: Annamaria Helling, DO;  Location: [place];  Service: Gastroenterology;  Laterality: N/A;   INCISIONAL HERNIA REPAIR N/A 11/23/2018   Procedure: HERNIA REPAIR INCISIONAL;  Surgeon: Olean Ree, MD;  Location: [facility];  Service: General;  Laterality: N/A;   INSERTION OF MESH N/A 11/23/2018   Procedure: INSERTION OF MESH;  Surgeon: Olean Ree, MD;  Location: [facility];  Service: General;  Laterality: N/A;   LAPAROTOMY N/A 01/20/2018   Procedure: EXPLORATORY LAPAROTOMY;  Surgeon: Olean Ree, MD;  Location: [facility];  Service: General;  Laterality: N/A;   TOOTH EXTRACTION      Family History  Problem Relation Age of Onset   Cancer Mother    Breast cancer Mother 47   Lung cancer Father    Alcohol abuse Sister    Bipolar disorder Sister    Alcohol abuse Maternal Grandmother    Stroke Maternal Grandmother    Stroke Paternal Grandmother     Allergies  Allergen Reactions   Penicillins Shortness Of Breath, Diarrhea and Nausea And Vomiting    Has patient had a PCN reaction causing immediate rash, facial/tongue/throat swelling, SOB or lightheadedness with hypotension: yes Has patient had a PCN reaction causing severe rash involving mucus membranes or skin necrosis: no Has patient had a PCN reaction that required hospitalization no Has patient had a PCN reaction occurring within the last 10 years: about 10 years If all of the above answers are "NO", then may proceed with Cephalosporin use.    Nsaids Other (See Comments)    Pt states she is not allergic  Other reaction(s): OTHER Pt states NSAIDS cause "internal bleeding"   Gabapentin Other (See Comments)    Hair Loss    Pregabalin     Tremors   Sulfa Antibiotics Nausea And Vomiting    Current Outpatient Medications on File Prior to Visit  Medication Sig Dispense Refill   DULoxetine (CYMBALTA) 30 MG capsule Take 30 mg by  mouth daily.     DULoxetine (CYMBALTA) 60 MG capsule TAKE 1 CAPSULE (60 MG TOTAL) BY MOUTH DAILY. FOR ANXIETY AND PAIN. 90 capsule 1   ferrous sulfate 325 (65 FE) MG tablet Take 1 tablet (325 mg total) by mouth every other day. 30 tablet 0   levothyroxine (SYNTHROID) 88 MCG tablet TAKE 1 TABLET BY MOUTH EVERY MORNING ON EMPTY STOMACH WITH WATER ONLY-NO FOOD OR OTHER MED FOR 30MIN 90 tablet 1   oxybutynin (DITROPAN-XL) 5 MG 24 hr tablet TAKE 1 TABLET (5 MG TOTAL) BY MOUTH AT BEDTIME. FOR BLADDER INCONTINENCE. 90 tablet 2   pantoprazole (PROTONIX) 40 MG tablet Take 1 tablet (40 mg total) by mouth daily. 30 tablet 0   SUMAtriptan (IMITREX) 50 MG tablet Take 1 tablet by mouth at migraine onset.  May repeat in 2 hours if headache persists or recurs.  Do not exceed 2 tablets in 24 hours. 9 tablet 0   topiramate (TOPAMAX) 100 MG tablet TAKE 1 TABLET (100 MG TOTAL) BY MOUTH AT BEDTIME. FOR MIGRAINE PREVENTION. 90 tablet  2   topiramate (TOPAMAX) 25 MG tablet Take 1 tablet (25 mg total) by mouth at bedtime. For migraines. Take with 100 mg dose. 90 tablet 0   traMADol (ULTRAM) 50 MG tablet Take 1 tablet (50 mg total) by mouth every 12 (twelve) hours as needed. 60 tablet 2   traZODone (DESYREL) 50 MG tablet Take 3 tablets (150 mg total) by mouth at bedtime. For sleep. 270 tablet 2   cetirizine (ZYRTEC) 10 MG tablet TAKE 1 TABLET BY MOUTH EVERY DAY AS NEEDED FOR ALLERGY (Patient not taking: Reported on 11/23/2021) 90 tablet 1   fluticasone (FLONASE) 50 MCG/ACT nasal spray Place 1 spray into both nostrils 2 (two) times daily. (Patient not taking: Reported on 11/23/2021) 16 g 0   tiZANidine (ZANAFLEX) 2 MG tablet TAKE 1 TABLET BY MOUTH AT BEDTIME AS NEEDED FOR MUSCLE SPASMS. DO NOT TAKE FLEXERIL (Patient not taking: Reported on 11/23/2021) 20 tablet 0   No current facility-administered medications on file prior to visit.    BP (!) 90/56   Pulse 79   Temp 97.9 F (36.6 C) (Temporal)   Ht 4' 11.5" (1.511 m)   Wt  104 lb (47.2 kg)   SpO2 97%   BMI 20.65 kg/m  Objective:   Physical Exam HENT:     Right Ear: Tympanic membrane and ear canal normal.     Left Ear: Tympanic membrane and ear canal normal.     Nose: Nose normal.  Eyes:     Conjunctiva/sclera: Conjunctivae normal.     Pupils: Pupils are equal, round, and reactive to light.  Neck:     Thyroid: No thyromegaly.  Cardiovascular:     Rate and Rhythm: Normal rate and regular rhythm.     Heart sounds: No murmur heard. Pulmonary:     Effort: Pulmonary effort is normal.     Breath sounds: Normal breath sounds. No rales.  Abdominal:     General: Bowel sounds are normal.     Palpations: Abdomen is soft.     Tenderness: There is no abdominal tenderness.  Musculoskeletal:        General: Normal range of motion.     Cervical back: Neck supple.  Lymphadenopathy:     Cervical: No cervical adenopathy.  Skin:    General: Skin is warm and dry.     Findings: No rash.  Neurological:     Mental Status: She is alert and oriented to person, place, and time.     Cranial Nerves: No cranial nerve deficit.     Deep Tendon Reflexes: Reflexes are normal and symmetric.  Psychiatric:        Mood and Affect: Mood normal.           Assessment & Plan:   Problem List Items Addressed This Visit       Cardiovascular and Mediastinum   Migraines    Improved.  Continue Topamax 125 mg daily. Continue sumatriptan 50 mg PRN.  Continue to monitor.        Digestive   GERD (gastroesophageal reflux disease)    Controlled.  Continue pantoprazole 40 mg daily. Follows with GI.        Endocrine   Hypothyroidism    She is taking levothyroxine correctly. Continue 88 mcg daily. Repeat TSH pending.      Relevant Orders   TSH     Musculoskeletal and Integument   Plantar fascial fibromatosis of both feet    Improved.  Continue Cymbalta 30 mg and 60  mg doses daily. Continue to monitor.   Following with pain management as well. Continue  Tramadol 25 mg PRN for which she uses sparingly.        Other   Bipolar I disorder, most recent episode depressed ([place]) (Chronic)    She is now getting set up with psychiatry and a new therapist.  Continue Cymbalta 60 mg daily and 30 mg daily. Continue Trazodone 150 mg HS.      Insomnia    Controlled.  Continue Trazodone 150 mg HS. Continue Cymbalta 60 mg daily and 30 mg daily.      Constipation    Controlled.  No concerns. Continue to monitor.       PTSD (post-traumatic stress disorder)    Stable.  Following with therapy, will be setting up with psychiatry soon.  Continue Cymbalta 60 mg and 30 mg. Continue Trazodone 150 mg HS.      Vitamin D deficiency    Continue vitamin D 1000 IU daily. Repeat vitamin D level pending      Relevant Orders   VITAMIN D 25 Hydroxy (Vit-D Deficiency, Fractures)   Vitamin B 12 deficiency    Continue vitamin B12 1000 mcg daily. Repeat vitamin B12 level pending.      Relevant Orders   Vitamin B12   Urge incontinence    Controlled.  Continue oxybutynin XL 5 mg daily. Continue to monitor.       Bilateral foot pain    Improved.  Continue Cymbalta 30 mg and 60 mg doses daily. Continue to monitor.       Iron deficiency anemia    Repeat labs pending.  Continue ferrous sulfate 325 mg daily.      Relevant Orders   CBC   IBC + Ferritin   Prediabetes    Repeat A1C pending.      Relevant Orders   Lipid panel   Hemoglobin A1c   Comprehensive metabolic panel   Preventative health care - Primary    Immunizations UTD. Influenza vaccine provided today. Mammogram UTD. Colonoscopy UTD, follows with GI.  Discussed the importance of a healthy diet and regular exercise in order for weight loss, and to reduce the risk of further co-morbidity.  Exam stable. Labs pending.  Follow up in 1 year for repeat physical.           Pleas Koch, NP

## 2021-11-23 NOTE — Assessment & Plan Note (Signed)
She is taking levothyroxine correctly. Continue 88 mcg daily. Repeat TSH pending.

## 2021-11-23 NOTE — Assessment & Plan Note (Signed)
Controlled.  Continue Trazodone 150 mg HS. Continue Cymbalta 60 mg daily and 30 mg daily.

## 2021-11-23 NOTE — Assessment & Plan Note (Signed)
Continue vitamin B12 1000 mcg daily. Repeat vitamin B12 level pending.

## 2021-11-25 ENCOUNTER — Telehealth: Payer: Self-pay | Admitting: Primary Care

## 2021-11-25 NOTE — Telephone Encounter (Signed)
Patient returned call about her lab results. I read her off the note that was left from [facility] regarding her labs,she was pleased!

## 2021-11-25 NOTE — Telephone Encounter (Signed)
Added message to results notes.  No further action needed at this time.

## 2021-11-27 ENCOUNTER — Other Ambulatory Visit: Payer: Self-pay | Admitting: Primary Care

## 2021-11-27 DIAGNOSIS — G43111 Migraine with aura, intractable, with status migrainosus: Secondary | ICD-10-CM

## 2021-11-27 DIAGNOSIS — G609 Hereditary and idiopathic neuropathy, unspecified: Secondary | ICD-10-CM

## 2021-11-27 DIAGNOSIS — M722 Plantar fascial fibromatosis: Secondary | ICD-10-CM

## 2021-12-01 ENCOUNTER — Telehealth: Payer: Self-pay | Admitting: Psychiatry

## 2021-12-01 NOTE — Telephone Encounter (Signed)
Patient left message wanting to verify which provider they would be seeing. Called patient back and left a message letting her know she will be seeing Dr Modesta Messing.

## 2021-12-04 ENCOUNTER — Telehealth: Payer: Self-pay | Admitting: Primary Care

## 2021-12-04 DIAGNOSIS — F331 Major depressive disorder, recurrent, moderate: Secondary | ICD-10-CM

## 2021-12-04 DIAGNOSIS — F4310 Post-traumatic stress disorder, unspecified: Secondary | ICD-10-CM

## 2021-12-04 DIAGNOSIS — F419 Anxiety disorder, unspecified: Secondary | ICD-10-CM

## 2021-12-04 NOTE — Telephone Encounter (Signed)
Noted.  I will place another referral for therapy.  She should receive another call back within 2 weeks.

## 2021-12-04 NOTE — Telephone Encounter (Signed)
Patient said that the hospital diagnosed her with Bipolar disorder and was told that [facility] doesn't work with patients with that. Scheduled appointment with psychiatry for 02/02/22. She was told by psychiatry that they don't set up appointment with therapist, that they only handle the medications. Verified that she wasn't referring to them not having an in office therapist. She confirmed that she was told she would have to set therapy up somewhere else and they weren't helping her with that.

## 2021-12-04 NOTE — Telephone Encounter (Signed)
Please call patient:  Why is she not able to see [facility] for therapy?  Also, it looks like the psychiatry office called her on 11/30/21, did she schedule an appointment with the psychiatrist? If so, then can help refer her to someone for therapy too.

## 2021-12-04 NOTE — Telephone Encounter (Signed)
Patient called and said she not able to see a counselor [facility] and she hasn't been notified about her new counselor but she did get notified about a Theatre stage manager but she rather not drive to [place] and also she preferably wants to see a female. Call back number [phone].

## 2021-12-04 NOTE — Telephone Encounter (Signed)
Patient advised.

## 2021-12-08 ENCOUNTER — Ambulatory Visit: Payer: 59 | Admitting: Podiatry

## 2021-12-10 ENCOUNTER — Encounter: Payer: Self-pay | Admitting: Student in an Organized Health Care Education/Training Program

## 2021-12-10 ENCOUNTER — Ambulatory Visit
Payer: 59 | Attending: Student in an Organized Health Care Education/Training Program | Admitting: Student in an Organized Health Care Education/Training Program

## 2021-12-10 VITALS — BP 136/75 | HR 78 | Temp 97.7°F | Resp 17 | Ht 59.0 in | Wt 104.0 lb

## 2021-12-10 DIAGNOSIS — Z0289 Encounter for other administrative examinations: Secondary | ICD-10-CM

## 2021-12-10 DIAGNOSIS — M79672 Pain in left foot: Secondary | ICD-10-CM | POA: Diagnosis not present

## 2021-12-10 DIAGNOSIS — G894 Chronic pain syndrome: Secondary | ICD-10-CM | POA: Diagnosis not present

## 2021-12-10 DIAGNOSIS — M722 Plantar fascial fibromatosis: Secondary | ICD-10-CM

## 2021-12-10 DIAGNOSIS — M79671 Pain in right foot: Secondary | ICD-10-CM | POA: Diagnosis not present

## 2021-12-10 DIAGNOSIS — F4310 Post-traumatic stress disorder, unspecified: Secondary | ICD-10-CM

## 2021-12-10 DIAGNOSIS — G609 Hereditary and idiopathic neuropathy, unspecified: Secondary | ICD-10-CM | POA: Diagnosis not present

## 2021-12-10 DIAGNOSIS — F3130 Bipolar disorder, current episode depressed, mild or moderate severity, unspecified: Secondary | ICD-10-CM | POA: Diagnosis present

## 2021-12-10 DIAGNOSIS — R69 Illness, unspecified: Secondary | ICD-10-CM | POA: Diagnosis not present

## 2021-12-10 NOTE — Progress Notes (Signed)
Nursing Pain Medication Assessment:  Safety precautions to be maintained throughout the outpatient stay will include: orient to surroundings, keep bed in low position, maintain call bell within reach at all times, provide assistance with transfer out of bed and ambulation.  Medication Inspection Compliance: Ms. Kernes did not comply with our request to bring her pills to be counted. She was reminded that bringing the medication bottles, even when empty, is a requirement.  Medication: None brought in. Pill/Patch Count: None available to be counted. Bottle Appearance: No container available. Did not bring bottle(s) to appointment. Filled Date: N/A Last Medication intake:  Yesterday

## 2021-12-10 NOTE — Progress Notes (Signed)
PROVIDER NOTE: Information contained herein reflects review and annotations entered in association with encounter. Interpretation of such information and data should be left to medically-trained personnel. Information provided to patient can be located elsewhere in the medical record under "Patient Instructions". Document created using STT-dictation technology, any transcriptional errors that may result from process are unintentional.    Patient: Tanya Nguyen  Service Category: E/M  Provider: Gillis Santa, MD  DOB: [birth year]  DOS: 12/10/2021  Referring Provider: Pleas Koch, NP  MRN: [id number]  Specialty: Interventional Pain Management  PCP: Pleas Koch, NP  Type: Established Patient  Setting: Ambulatory outpatient    Location: Office  Delivery: Face-to-face     HPI  Tanya Nguyen, a 57 y.o. year old female, is here today because of her Plantar fascial fibromatosis of both feet [M72.2]. Tanya Nguyen primary complain today is Foot Pain (bilat) Last encounter: My last encounter with her was on 09/22/2021. Pertinent problems: Tanya Nguyen has Migraines; PTSD (post-traumatic stress disorder); Plantar fascial fibromatosis of both feet; Sacroiliac joint pain; Idiopathic peripheral neuropathy; Chronic pain syndrome; and Pain management contract signed on their pertinent problem list. Pain Assessment: Severity of Chronic pain is reported as a 8 /10. Location: Foot Right, Left (bottom of feet bilat)/sometimes up to calves bilat. Onset: More than a month ago. Quality: Aching, Burning, Sharp, Shooting. Timing: Constant. Modifying factor(s): meds. Vitals:  height is 4' 11"  (1.499 m) and weight is 104 lb (47.2 kg). Her temporal temperature is 97.7 F (36.5 C). Her blood pressure is 136/75 and her pulse is 78. Her respiration is 17 and oxygen saturation is 99%.   Reason for encounter:  Tanya Nguyen presents today for medication management.  No significant change in her medical history.  She is endorsing  increased bilateral foot pain related to plantar fasciitis.  She is requesting a cortisone injection which she has received in the past with podiatry for plantar fasciitis that was helpful.  Of note, patient's previous tramadol prescription was partially filled for only 7 days.  She contacted pharmacy and they will be able to fill the remainder.  09/17/2021: She states that her current dose of tramadol providing effective analgesic and functional benefit.  States that Dr. Amalia Hailey her podiatrist prefers that tramadol be managed here.  We will increase dose to 50 mg twice daily for the purpose of obtaining better pain relief.  She will also sign a pain contract today.   03/13/2020: HPI from initial clinic visit below: [name] is a pleasant 56 year old female who presents with a chief complaint of bilateral foot pain related to plantar fasciitis and idiopathic peripheral neuropathy.  She describes her pain as burning and tingling has been chronic in nature.  She has been followed by podiatry in the past and has received steroid injections in both of her feet, bilateral heels.  She has been referred by Dr. Amalia Hailey for medication management.  Patient is on tramadol and takes it 50 mg once or twice daily as needed.  She is also on Lyrica 75 mg twice a day and 225 mg nightly.  This is being managed by her primary care provider.  She is also on Cymbalta 30 mg as well as Zoloft 50 mg.  No symptoms concerning for serotonin syndrome such as agitation, nausea, diaphoresis, tachycardia, palpitations..  She would like to continue her tramadol and is      Pharmacotherapy Assessment  Analgesic: Tramadol 50 mg twice daily as needed  Monitoring: [place] PMP: PDMP reviewed during this encounter.  Pharmacotherapy: No side-effects or adverse reactions reported. Compliance: No problems identified. Effectiveness: Clinically acceptable.  Rise Patience, RN  12/10/2021  1:55 PM  Sign when Signing Visit Nursing Pain Medication  Assessment:  Safety precautions to be maintained throughout the outpatient stay will include: orient to surroundings, keep bed in low position, maintain call bell within reach at all times, provide assistance with transfer out of bed and ambulation.  Medication Inspection Compliance: Tanya Nguyen did not comply with our request to bring her pills to be counted. She was reminded that bringing the medication bottles, even when empty, is a requirement.  Medication: None brought in. Pill/Patch Count: None available to be counted. Bottle Appearance: No container available. Did not bring bottle(s) to appointment. Filled Date: N/A Last Medication intake:  Yesterday    No results found for: "CBDTHCR" No results found for: "D8THCCBX" No results found for: "D9THCCBX"  UDS:  Summary  Date Value Ref Range Status  03/13/2020 Note  Final    Comment:    ==================================================================== Compliance Drug Analysis, Ur ==================================================================== Test                             Result       Flag       Units  Drug Present and Declared for Prescription Verification   Tramadol                       >11905       EXPECTED   ng/mg creat   O-Desmethyltramadol            9229         EXPECTED   ng/mg creat   N-Desmethyltramadol            3952         EXPECTED   ng/mg creat    Source of tramadol is a prescription medication. O-desmethyltramadol    and N-desmethyltramadol are expected metabolites of tramadol.    Pregabalin                     PRESENT      EXPECTED   Topiramate                     PRESENT      EXPECTED   Cyclobenzaprine                PRESENT      EXPECTED   Desmethylcyclobenzaprine       PRESENT      EXPECTED    Desmethylcyclobenzaprine is an expected metabolite of    cyclobenzaprine.    Duloxetine                     PRESENT      EXPECTED   Trazodone                      PRESENT      EXPECTED   1,3 chlorophenyl  piperazine    PRESENT      EXPECTED    1,3-chlorophenyl piperazine is an expected metabolite of trazodone.  Drug Present not Declared for Prescription Verification   Salicylate                     PRESENT      UNEXPECTED  Drug Absent but Declared for Prescription Verification   Sertraline  Not Detected UNEXPECTED ==================================================================== Test                      Result    Flag   Units      Ref Range   Creatinine              42               mg/dL      >=20 ==================================================================== Declared Medications:  The flagging and interpretation on this report are based on the  following declared medications.  Unexpected results may arise from  inaccuracies in the declared medications.   **Note: The testing scope of this panel includes these medications:   Cyclobenzaprine  Duloxetine  Pregabalin  Sertraline  Topiramate  Tramadol  Trazodone   **Note: The testing scope of this panel does not include the  following reported medications:   Cetirizine  Fluticasone  Levothyroxine  Pantoprazole  Sumatriptan ==================================================================== For clinical consultation, please call [phone]. ====================================================================       ROS  Constitutional: Denies any fever or chills Gastrointestinal: No reported hemesis, hematochezia, vomiting, or acute GI distress Musculoskeletal:  bilateral foot pain Neurological: No reported episodes of acute onset apraxia, aphasia, dysarthria, agnosia, amnesia, paralysis, loss of coordination, or loss of consciousness  Medication Review  DULoxetine, SUMAtriptan, ferrous sulfate, levothyroxine, oxybutynin, pantoprazole, topiramate, traMADol, and traZODone  History Review  Allergy: Tanya Nguyen is allergic to penicillins, nsaids, gabapentin, pregabalin, and sulfa  antibiotics. Drug: Tanya Nguyen  reports no history of drug use. Alcohol:  reports that she does not currently use alcohol. Tobacco:  reports that she has never smoked. She has never used smokeless tobacco. Social: Tanya Nguyen  reports that she has never smoked. She has never used smokeless tobacco. She reports that she does not currently use alcohol. She reports that she does not use drugs. Medical:  has a past medical history of Acquired pyloric stricture, Bleeding duodenal ulcer, CAP (community acquired pneumonia) (02/12/2020), Chronic esophagogastric ulcer, Depression, Duodenal obstruction, Gastric outlet obstruction (01/19/2018), GERD (gastroesophageal reflux disease), Hair loss (09/22/2018), History of stomach ulcers, Hypothyroidism, Incisional hernia, without obstruction or gangrene, Intractable vomiting, Kidney stones, Melena (05/08/2021), Migraine, Migraine headache, Neuropathy, Side pain (03/30/2018), and Upper GI bleed (05/18/2021). Surgical: Tanya Nguyen  has a past surgical history that includes Abdominal hysterectomy (2004); Cholecystectomy; Tooth extraction; Esophagogastroduodenoscopy (egd) with propofol (N/A, 01/12/2018); laparotomy (N/A, 01/20/2018); Esophagogastroduodenoscopy (egd) with propofol (N/A, 01/25/2018); Colonoscopy with propofol (N/A, 02/27/2018); Esophagogastroduodenoscopy (egd) with propofol (N/A, 02/27/2018); Incisional hernia repair (N/A, 11/23/2018); Insertion of mesh (N/A, 11/23/2018); and Esophagogastroduodenoscopy (egd) with propofol (N/A, 05/09/2021). Family: family history includes Alcohol abuse in her maternal grandmother and sister; Bipolar disorder in her sister; Breast cancer (age of onset: 24) in her mother; Cancer in her mother; Lung cancer in her father; Stroke in her maternal grandmother and paternal grandmother.  Laboratory Chemistry Profile   Renal Lab Results  Component Value Date   BUN 16 11/23/2021   CREATININE 0.92 11/23/2021   BCR 23 02/09/2019   GFR 69.29  11/23/2021   GFRAA 82 02/09/2019   GFRNONAA >60 09/21/2021    Hepatic Lab Results  Component Value Date   AST 13 11/23/2021   ALT 10 11/23/2021   ALBUMIN 3.7 11/23/2021   ALKPHOS 68 11/23/2021   LIPASE 40 01/19/2018    Electrolytes Lab Results  Component Value Date   NA 143 11/23/2021   K 3.8 11/23/2021   CL 110 11/23/2021   CALCIUM 8.6 11/23/2021  MG 1.8 01/26/2018   PHOS 4.8 (H) 01/26/2018    Bone Lab Results  Component Value Date   VD25OH 38.39 11/23/2021    Inflammation (CRP: Acute Phase) (ESR: Chronic Phase) No results found for: "CRP", "ESRSEDRATE", "LATICACIDVEN"       Note: Above Lab results reviewed.  Recent Imaging Review  CT Chest W Contrast CLINICAL DATA:  Lung nodule abnormal chest x-ray  EXAM: CT CHEST WITH CONTRAST  TECHNIQUE: Multidetector CT imaging of the chest was performed during intravenous contrast administration.  RADIATION DOSE REDUCTION: This exam was performed according to the departmental dose-optimization program which includes automated exposure control, adjustment of the mA and/or kV according to patient size and/or use of iterative reconstruction technique.  CONTRAST:  91m OMNIPAQUE IOHEXOL 300 MG/ML  SOLN  COMPARISON:  Chest x-ray 09/17/2021, CT chest 04/02/2020, CT abdomen pelvis 09/21/2021  FINDINGS: Cardiovascular: Aorta is nonaneurysmal. Mild atherosclerosis. Mild coronary vascular calcification. Normal cardiac size. Trace pericardial effusion  Mediastinum/Nodes: Midline trachea. No thyroid mass. No suspicious lymph nodes. Esophagus within normal limits.  Lungs/Pleura: No acute airspace disease, pleural effusion or pneumothorax. The previously noted right middle lobe nodularity has largely resolved. There is minimal hazy and streaky density in the region, felt consistent with post infectious or post inflammatory scarring. Small amount of subpleural reticulation in the right upper lobe as well.  Upper Abdomen:  Fluid distension of the stomach with gastro jejunostomy. Thickened appearance of the pylorus. Partially visualized fluid-filled bowel in the left upper quadrant. Post cholecystectomy. Cyst in the right hepatic lobe.  Musculoskeletal: No acute osseous abnormality  IMPRESSION: 1. Previously noted right middle lobe nodularity has largely resolved. There is minimal hazy and streaky density in the region felt consistent with post infectious or post inflammatory scarring. 2. Postoperative appearance of the stomach. There is moderate fluid distension of stomach. There is thickened appearance of the pylorus which could be due to spasm versus peptic ulcer disease.  Aortic Atherosclerosis (ICD10-I70.0).  Electronically Signed   By: KDonavan FoilM.D.   On: 10/06/2021 22:59 Note: Reviewed        Physical Exam  General appearance: Well nourished, well developed, and well hydrated. In no apparent acute distress Mental status: Alert, oriented x 3 (person, place, & time)       Respiratory: No evidence of acute respiratory distress Eyes: PERLA Vitals: BP 136/75   Pulse 78   Temp 97.7 F (36.5 C) (Temporal)   Resp 17   Ht 4' 11"  (1.499 m)   Wt 104 lb (47.2 kg)   SpO2 99%   BMI 21.01 kg/m  BMI: Estimated body mass index is 21.01 kg/m as calculated from the following:   Height as of this encounter: 4' 11"  (1.499 m).   Weight as of this encounter: 104 lb (47.2 kg). Ideal: Patient must be at least 60 in tall to calculate ideal body weight  Thoracic Spine Area Exam  Skin & Axial Inspection: No masses, redness, or swelling Alignment: Symmetrical Functional ROM: Unrestricted ROM Stability: No instability detected Muscle Tone/Strength: Functionally intact. No obvious neuro-muscular anomalies detected. Sensory (Neurological): Unimpaired   Lumbar Exam  Skin & Axial Inspection: No masses, redness, or swelling Alignment: Symmetrical Functional ROM: Unrestricted ROM       Stability: No  instability detected Muscle Tone/Strength: Functionally intact. No obvious neuro-muscular anomalies detected. Sensory (Neurological): Unimpaired    Gait & Posture Assessment  Ambulation: Unassisted Gait: Relatively normal for age and body habitus Posture: WNL    Lower Extremity Exam  Side: Right lower extremity   Side: Left lower extremity  Stability: No instability observed           Stability: No instability observed          Skin & Extremity Inspection: Skin color, temperature, and hair growth are WNL. No peripheral edema or cyanosis. No masses, redness, swelling, asymmetry, or associated skin lesions. No contractures.   Skin & Extremity Inspection: Skin color, temperature, and hair growth are WNL. No peripheral edema or cyanosis. No masses, redness, swelling, asymmetry, or associated skin lesions. No contractures.  Functional ROM: Pain restricted ROM ankle/foot           Functional ROM: Pain restricted ROM ankle/foot          Muscle Tone/Strength: Functionally intact. No obvious neuro-muscular anomalies detected.   Muscle Tone/Strength: Functionally intact. No obvious neuro-muscular anomalies detected.  Sensory (Neurological): Neuropathic pain pattern         Sensory (Neurological): Neuropathic pain pattern        DTR: Patellar: deferred today Achilles: deferred today Plantar: deferred today   DTR: Patellar: deferred today Achilles: deferred today Plantar: deferred today  Palpation: No palpable anomalies   Palpation: No palpable anomalies      Assessment   Diagnosis Status  1. Plantar fascial fibromatosis of both feet   2. Idiopathic peripheral neuropathy   3. Bilateral foot pain   4. PTSD (post-traumatic stress disorder)   5. Bipolar I disorder, most recent episode depressed ([place])   6. Pain management contract signed   7. Chronic pain syndrome    Controlled Controlled Controlled   Updated Problems: Problem  Pain Management Contract Signed  Chronic Pain Syndrome   Idiopathic Peripheral Neuropathy  Sacroiliac Joint Pain  Plantar Fascial Fibromatosis of Both Feet  Ptsd (Post-Traumatic Stress Disorder)  Migraines     Plan of Care  Continue tramadol as prescribed Follow-up for bilateral cortisone injections in bilateral feet for planter fasciitis.    Orders:  Orders Placed This Encounter  Procedures   Injection tendon or ligament    Standing Status:   Future    Standing Expiration Date:   03/12/2022    Scheduling Instructions:     B/l foot steroid  for b/l plantar fascisits   Follow-up plan:   Return in about 2 weeks (around 12/24/2021) for Bilateral foot injections for Planter fasciitis, in clinic [facility].    Recent Visits Date Type Provider Dept  09/17/21 Office Visit Gillis Santa, MD [facility]  Showing recent visits within past 90 days and meeting all other requirements Today's Visits Date Type Provider Dept  12/10/21 Office Visit Gillis Santa, MD [facility]  Showing today's visits and meeting all other requirements Future Appointments No visits were found meeting these conditions. Showing future appointments within next 90 days and meeting all other requirements  I discussed the assessment and treatment plan with the patient. The patient was provided an opportunity to ask questions and all were answered. The patient agreed with the plan and demonstrated an understanding of the instructions.  Patient advised to call back or seek an in-person evaluation if the symptoms or condition worsens.  Duration of encounter: 18mnutes.  Total time on encounter, as per AMA guidelines included both the face-to-face and non-face-to-face time personally spent by the physician and/or other qualified health care professional(s) on the day of the encounter (includes time in activities that require the physician or other qualified health care professional and does not include time in activities normally  performed by clinical staff).  Physician's time may include the following activities when performed: preparing to see the patient (eg, review of tests, pre-charting review of records) obtaining and/or reviewing separately obtained history performing a medically appropriate examination and/or evaluation counseling and educating the patient/family/caregiver ordering medications, tests, or procedures referring and communicating with other health care professionals (when not separately reported) documenting clinical information in the electronic or other health record independently interpreting results (not separately reported) and communicating results to the patient/ family/caregiver care coordination (not separately reported)  Note by: Gillis Santa, MD Date: 12/10/2021; Time: 2:47 PM

## 2021-12-10 NOTE — Progress Notes (Signed)
Nursing Pain Medication Assessment:  Safety precautions to be maintained throughout the outpatient stay will include: orient to surroundings, keep bed in low position, maintain call bell within reach at all times, provide assistance with transfer out of bed and ambulation.  Medication Inspection Compliance: Pill count conducted under aseptic conditions, in front of the patient. Neither the pills nor the bottle was removed from the patient's sight at any time. Once count was completed pills were immediately returned to the patient in their original bottle.  Medication: Tramadol (Ultram) Pill/Patch Count:  12 of 14 pills remain Pill/Patch Appearance: Markings consistent with prescribed medication Bottle Appearance: Standard pharmacy container. Clearly labeled. Filled Date: 10 / 03 / 2023 Last Medication intake:  Today

## 2021-12-16 ENCOUNTER — Encounter: Payer: Self-pay | Admitting: Primary Care

## 2021-12-16 ENCOUNTER — Other Ambulatory Visit: Payer: Self-pay | Admitting: Primary Care

## 2021-12-16 ENCOUNTER — Ambulatory Visit (INDEPENDENT_AMBULATORY_CARE_PROVIDER_SITE_OTHER): Payer: 59 | Admitting: Primary Care

## 2021-12-16 VITALS — BP 106/68 | HR 90 | Temp 99.5°F | Ht 59.0 in | Wt 101.0 lb

## 2021-12-16 DIAGNOSIS — G43709 Chronic migraine without aura, not intractable, without status migrainosus: Secondary | ICD-10-CM

## 2021-12-16 DIAGNOSIS — G43111 Migraine with aura, intractable, with status migrainosus: Secondary | ICD-10-CM

## 2021-12-16 DIAGNOSIS — F331 Major depressive disorder, recurrent, moderate: Secondary | ICD-10-CM

## 2021-12-16 DIAGNOSIS — F419 Anxiety disorder, unspecified: Secondary | ICD-10-CM

## 2021-12-16 DIAGNOSIS — R69 Illness, unspecified: Secondary | ICD-10-CM | POA: Diagnosis not present

## 2021-12-16 DIAGNOSIS — F4310 Post-traumatic stress disorder, unspecified: Secondary | ICD-10-CM

## 2021-12-16 MED ORDER — KETOROLAC TROMETHAMINE 60 MG/2ML IM SOLN
60.0000 mg | Freq: Once | INTRAMUSCULAR | Status: AC
  Administered 2021-12-16: 60 mg via INTRAMUSCULAR

## 2021-12-16 MED ORDER — BUPROPION HCL ER (SR) 100 MG PO TB12: 100.0000 mg | ORAL_TABLET | Freq: Two times a day (BID) | ORAL | 0 refills | Status: DC

## 2021-12-16 NOTE — Assessment & Plan Note (Signed)
Active symptoms. She has been referred to a PTSD therapist and is waiting to hear back. Will check on the status of this.   Follow up with psychiatry as scheduled.  Continue Cymbalta 90 mg daily. Add Wellbutrin SR 100 mg daily x 5 days, then increase to 100 mg BID.
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
## 2021-12-16 NOTE — Patient Instructions (Signed)
Start bupropion (Wellbutrin) SR for depression. Take 1 tablet once daily for 5 days, then increase to 1 tablet twice daily thereafter.  Continue taking Cymbalta.  Schedule a follow up visit for 1 month.  It was a pleasure to see you today!

## 2021-12-16 NOTE — Addendum Note (Signed)
Addended by: Pat Kocher on: 12/16/2021 01:59 PM   Modules accepted: Orders

## 2021-12-16 NOTE — Assessment & Plan Note (Signed)
Uncontrolled.  She assures me that she has no intension on harming herself. Follow up with psychiatry as scheduled.  Continue Cymbalta 90 mg daily. Add Wellbutrin SR 100 mg daily x 5 days, then increase to 100 mg BID.  Follow up in 1 month.

## 2021-12-22 ENCOUNTER — Ambulatory Visit: Payer: 59 | Admitting: Podiatry

## 2021-12-22 DIAGNOSIS — M722 Plantar fascial fibromatosis: Secondary | ICD-10-CM | POA: Diagnosis not present

## 2021-12-22 MED ORDER — BETAMETHASONE SOD PHOS & ACET 6 (3-3) MG/ML IJ SUSP
3.0000 mg | Freq: Once | INTRAMUSCULAR | Status: AC
  Administered 2021-12-22: 3 mg via INTRA_ARTICULAR

## 2021-12-22 NOTE — Progress Notes (Signed)
   Chief Complaint  Patient presents with   Plantar Fasciitis    Patient is here complaining of bilateral foot pain, she states that the injections really help her and she would like injections today.    Subjective: 56 year old female presenting today for follow up evaluation of bilateral chronic severe pain with idiopathic peripheral neuropathy.  Patient is being managed closely with PCP for chronic pain.  Patient states injections in the past have helped significantly.  She is requesting another injection today.  She presents for follow-up treatment and evaluation   Past Medical History:  Diagnosis Date   Acquired pyloric stricture    Bleeding duodenal ulcer    CAP (community acquired pneumonia) 02/12/2020   Chronic esophagogastric ulcer    Depression    Duodenal obstruction    Gastric outlet obstruction 01/19/2018   GERD (gastroesophageal reflux disease)    Hair loss 09/22/2018   History of stomach ulcers    Hypothyroidism    Incisional hernia, without obstruction or gangrene    Intractable vomiting    Kidney stones    Melena 05/08/2021   Migraine    Migraine headache    only1-2x/month since starting topamax   Neuropathy    Side pain 03/30/2018   Upper GI bleed 05/18/2021     Objective: Physical Exam General: The patient is alert and oriented x3 in no acute distress.  Dermatology: Skin is warm, dry and supple bilateral lower extremities. Negative for open lesions or macerations bilateral.   Vascular: Dorsalis Pedis and Posterior Tibial pulses palpable bilateral.  Capillary fill time is immediate to all digits.  Neurological: Epicritic and protective threshold intact bilateral.  Patient does experience burning with pins-and-needles to the bilateral feet daily  Musculoskeletal: There continues to be chronic tenderness to palpation to the plantar aspect of the bilateral heels along the plantar fascia as well as hypersensitivity throughout the entire foot. All other joints  range of motion within normal limits bilateral. Strength 5/5 in all groups bilateral.   Assessment: 1. plantar fasciitis bilateral feet  2. Idiopathic peripheral neuropathy bilateral   Plan of Care:  1. Patient evaluated.   2. Injection of 0.5cc Celestone Soluspan injected into the bilateral heels. 3. No NSAIDs prescribed due to H/o GI bleeds according to patient.  4. Continue mgmt with PCP for chronic pain 5. RTC 4 weeks  Working 3 days/week at [employer].  Edrick Kins, DPM [facility]  Dr. Edrick Kins, DPM    [address], [place] [address]                Office [phone]  Fax [phone]

## 2021-12-28 ENCOUNTER — Ambulatory Visit: Payer: 59 | Admitting: Student in an Organized Health Care Education/Training Program

## 2022-01-04 ENCOUNTER — Other Ambulatory Visit: Payer: Self-pay | Admitting: Primary Care

## 2022-01-04 DIAGNOSIS — G43709 Chronic migraine without aura, not intractable, without status migrainosus: Secondary | ICD-10-CM

## 2022-01-06 ENCOUNTER — Other Ambulatory Visit: Payer: Self-pay | Admitting: Primary Care

## 2022-01-06 ENCOUNTER — Other Ambulatory Visit: Payer: Self-pay | Admitting: Family

## 2022-01-06 DIAGNOSIS — M62838 Other muscle spasm: Secondary | ICD-10-CM

## 2022-01-06 DIAGNOSIS — E038 Other specified hypothyroidism: Secondary | ICD-10-CM

## 2022-01-11 ENCOUNTER — Other Ambulatory Visit: Payer: Self-pay | Admitting: Primary Care

## 2022-01-11 DIAGNOSIS — R32 Unspecified urinary incontinence: Secondary | ICD-10-CM

## 2022-01-21 ENCOUNTER — Ambulatory Visit: Payer: 59 | Admitting: Primary Care

## 2022-01-22 ENCOUNTER — Encounter: Payer: Self-pay | Admitting: Primary Care

## 2022-01-22 ENCOUNTER — Ambulatory Visit (INDEPENDENT_AMBULATORY_CARE_PROVIDER_SITE_OTHER): Payer: 59 | Admitting: Primary Care

## 2022-01-22 VITALS — BP 112/60 | HR 102 | Temp 99.3°F | Ht 59.0 in | Wt 100.0 lb

## 2022-01-22 DIAGNOSIS — F330 Major depressive disorder, recurrent, mild: Secondary | ICD-10-CM

## 2022-01-22 DIAGNOSIS — F419 Anxiety disorder, unspecified: Secondary | ICD-10-CM | POA: Diagnosis not present

## 2022-01-22 DIAGNOSIS — R69 Illness, unspecified: Secondary | ICD-10-CM | POA: Diagnosis not present

## 2022-01-22 NOTE — Assessment & Plan Note (Signed)
Improved!  She appears in a much better mood today. Denies SI/HI.  Continue Cymbalta 90 mg daily. Continue bupropion SR 100 mg BID.  Follow up with psychiatry as scheduled. Recommended she ask for a referral for therapy from her new psychiatrist.  Our therapy group declined her referral.

## 2022-01-22 NOTE — Patient Instructions (Signed)
Continue taking bupropion SR 100 mg twice daily for depression.  Continue Cymbalta 90 mg daily for anxiety/depression.  Follow up with the psychiatrist as scheduled.  It was a pleasure to see you today!

## 2022-01-22 NOTE — Progress Notes (Signed)
Subjective:    Patient ID: Tanya Nguyen, female    DOB: 06-08-1965, 56 y.o.   MRN: [id number]  HPI  Tanya Nguyen is a very pleasant 56 y.o. female with a history of Bipolar disorder, anxiety, depression, PTSD, insomnia, fatigue who presents today for follow up of depression.  She was last evaluated on 12/16/21 for increased symptoms of depression and PTSD. During this visit we continued her duloxetine to 90 mg daily and added Wellbutrin SR 100 mg BID. She had an appointment scheduled with psychiatry for November 2023. Given the severity of her symptoms she was asked to follow up in 1 month. She is here today for follow up.  Since her last visit she is compliant to her Cymbalta 90 mg daily and her bupropion SR 100 mg BID. She's feeling much better! Positive effects include less tearfulness, less mood swings, is feeling happier overall, is able to cope with stressful situations.   She has an appointment scheduled with psychiatry on 02/02/22. She has still yet to hear back from therapy. She denies SI/HI. She is sleeping well. Headaches have improved.   Review of Systems  Eyes:  Negative for visual disturbance.  Respiratory:  Negative for shortness of breath.   Cardiovascular:  Negative for chest pain.  Neurological:  Negative for headaches.  Psychiatric/Behavioral:  Negative for sleep disturbance. The patient is nervous/anxious.        See HPI         Past Medical History:  Diagnosis Date   Acquired pyloric stricture    Bleeding duodenal ulcer    CAP (community acquired pneumonia) 02/12/2020   Chronic esophagogastric ulcer    Depression    Duodenal obstruction    Gastric outlet obstruction 01/19/2018   GERD (gastroesophageal reflux disease)    Hair loss 09/22/2018   History of stomach ulcers    Hypothyroidism    Incisional hernia, without obstruction or gangrene    Intractable vomiting    Kidney stones    Melena 05/08/2021   Migraine    Migraine headache    only1-2x/month  since starting topamax   Neuropathy    Side pain 03/30/2018   Upper GI bleed 05/18/2021    Social History   Socioeconomic History   Marital status: Married    Spouse name: Not on file   Number of children: 0   Years of education: 12   Highest education level: High school graduate  Occupational History   Occupation: Homemaker  Tobacco Use   Smoking status: Never   Smokeless tobacco: Never  Vaping Use   Vaping Use: Never used  Substance and Sexual Activity   Alcohol use: Not Currently    Alcohol/week: 0.0 standard drinks of alcohol   Drug use: No   Sexual activity: Yes    Birth control/protection: None  Other Topics Concern   Not on file  Social History Narrative   Lives at home with her husband.   Right-handed.   Rare caffeine.   Social Determinants of Health   Financial Resource Strain: Not on file  Food Insecurity: Not on file  Transportation Needs: Not on file  Physical Activity: Not on file  Stress: Not on file  Social Connections: Not on file  Intimate Partner Violence: Not on file    Past Surgical History:  Procedure Laterality Date   ABDOMINAL HYSTERECTOMY  2004   partial   CHOLECYSTECTOMY     COLONOSCOPY WITH PROPOFOL N/A 02/27/2018   Procedure: COLONOSCOPY WITH PROPOFOL;  Surgeon: Allen Norris,  Darren, MD;  Location: [facility];  Service: Endoscopy;  Laterality: N/A;   ESOPHAGOGASTRODUODENOSCOPY (EGD) WITH PROPOFOL N/A 01/12/2018   Procedure: ESOPHAGOGASTRODUODENOSCOPY (EGD) WITH BIOPSIES;  Surgeon: [name], MD;  Location: [place] [facility];  Service: Endoscopy;  Laterality: N/A;   ESOPHAGOGASTRODUODENOSCOPY (EGD) WITH PROPOFOL N/A 01/25/2018   Procedure: ESOPHAGOGASTRODUODENOSCOPY (EGD) WITH PROPOFOL;  Surgeon: Jonathon Bellows, MD;  Location: [facility] ENDOSCOPY;  Service: Gastroenterology;  Laterality: N/A;   ESOPHAGOGASTRODUODENOSCOPY (EGD) WITH PROPOFOL N/A 02/27/2018   Procedure: ESOPHAGOGASTRODUODENOSCOPY (EGD) WITH PROPOFOL;  Surgeon: [name],  MD;  Location: [facility];  Service: Endoscopy;  Laterality: N/A;   ESOPHAGOGASTRODUODENOSCOPY (EGD) WITH PROPOFOL N/A 05/09/2021   Procedure: ESOPHAGOGASTRODUODENOSCOPY (EGD) WITH PROPOFOL;  Surgeon: Annamaria Helling, DO;  Location: [facility];  Service: Gastroenterology;  Laterality: N/A;   INCISIONAL HERNIA REPAIR N/A 11/23/2018   Procedure: HERNIA REPAIR INCISIONAL;  Surgeon: Olean Ree, MD;  Location: [facility];  Service: General;  Laterality: N/A;   INSERTION OF MESH N/A 11/23/2018   Procedure: INSERTION OF MESH;  Surgeon: Olean Ree, MD;  Location: [facility];  Service: General;  Laterality: N/A;   LAPAROTOMY N/A 01/20/2018   Procedure: EXPLORATORY LAPAROTOMY;  Surgeon: Olean Ree, MD;  Location: [facility];  Service: General;  Laterality: N/A;   TOOTH EXTRACTION      Family History  Problem Relation Age of Onset   Cancer Mother    Breast cancer Mother 59   Lung cancer Father    Alcohol abuse Sister    Bipolar disorder Sister    Alcohol abuse Maternal Grandmother    Stroke Maternal Grandmother    Stroke Paternal Grandmother     Allergies  Allergen Reactions   Penicillins Shortness Of Breath, Diarrhea and Nausea And Vomiting    Has patient had a PCN reaction causing immediate rash, facial/tongue/throat swelling, SOB or lightheadedness with hypotension: yes Has patient had a PCN reaction causing severe rash involving mucus membranes or skin necrosis: no Has patient had a PCN reaction that required hospitalization no Has patient had a PCN reaction occurring within the last 10 years: about 10 years If all of the above answers are "NO", then may proceed with Cephalosporin use.    Nsaids Other (See Comments)    Pt states she is not allergic  Other reaction(s): OTHER Pt states NSAIDS cause "internal bleeding"   Gabapentin Other (See Comments)    Hair Loss    Pregabalin     Tremors   Sulfa Antibiotics Nausea And Vomiting    Current Outpatient Medications  on File Prior to Visit  Medication Sig Dispense Refill   buPROPion ER (WELLBUTRIN SR) 100 MG 12 hr tablet Take 1 tablet (100 mg total) by mouth 2 (two) times daily. For depression. 180 tablet 0   DULoxetine (CYMBALTA) 30 MG capsule TAKE 1 CAPSULE (30 MG TOTAL) BY MOUTH DAILY. FOR ANXIETY AND PAIN. TAKE WITH 60 MG. 90 capsule 3   DULoxetine (CYMBALTA) 60 MG capsule TAKE 1 CAPSULE (60 MG TOTAL) BY MOUTH DAILY. FOR ANXIETY AND PAIN. 90 capsule 1   ferrous sulfate 325 (65 FE) MG tablet Take 1 tablet (325 mg total) by mouth every other day. 30 tablet 0   levothyroxine (SYNTHROID) 88 MCG tablet TAKE 1 TABLET BY MOUTH EVERY MORNING ON EMPTY STOMACH WITH WATER ONLY-NO FOOD OR OTHER MED FOR 30MIN 90 tablet 2   oxybutynin (DITROPAN-XL) 5 MG 24 hr tablet TAKE 1 TABLET (5 MG TOTAL) BY MOUTH AT BEDTIME. FOR BLADDER INCONTINENCE. 90 tablet  2   pantoprazole (PROTONIX) 40 MG tablet Take 1 tablet (40 mg total) by mouth daily. 30 tablet 0   SUMAtriptan (IMITREX) 50 MG tablet TAKE 1 TABLET BY MOUTH AT MIGRAINE ONSET. MAY REPEAT IN 2 HOURS IF HEADACHE PERSISTS OR RECURS. DO NOT EXCEED 2 TABLETS IN 24 HOURS. 9 tablet 0   topiramate (TOPAMAX) 100 MG tablet TAKE 1 TABLET (100 MG TOTAL) BY MOUTH AT BEDTIME. FOR MIGRAINE PREVENTION. 90 tablet 3   topiramate (TOPAMAX) 25 MG tablet TAKE 1 TABLET (25 MG TOTAL) BY MOUTH AT BEDTIME. FOR MIGRAINES. TAKE WITH 100 MG DOSE. 90 tablet 3   traMADol (ULTRAM) 50 MG tablet Take 1 tablet (50 mg total) by mouth every 12 (twelve) hours as needed. 60 tablet 2   traZODone (DESYREL) 50 MG tablet Take 3 tablets (150 mg total) by mouth at bedtime. For sleep. 270 tablet 2   No current facility-administered medications on file prior to visit.    BP 112/60   Pulse (!) 102   Temp 99.3 F (37.4 C) (Temporal)   Ht '4\' 11"'$  (1.499 m)   Wt 100 lb (45.4 kg)   SpO2 98%   BMI 20.20 kg/m  Objective:   Physical Exam Cardiovascular:     Rate and Rhythm: Normal rate and regular rhythm.  Pulmonary:      Effort: Pulmonary effort is normal.     Breath sounds: Normal breath sounds.  Musculoskeletal:     Cervical back: Neck supple.  Skin:    General: Skin is warm and dry.  Psychiatric:        Mood and Affect: Mood normal.           Assessment & Plan:   Problem List Items Addressed This Visit       Other   Depression - Primary    Improved!  She appears in a much better mood today. Denies SI/HI.  Continue Cymbalta 90 mg daily. Continue bupropion SR 100 mg BID.  Follow up with psychiatry as scheduled. Recommended she ask for a referral for therapy from her new psychiatrist.  Our therapy group declined her referral.       Anxiety    Improved!  She appears in a much better mood today. Denies SI/HI.  Continue Cymbalta 90 mg daily. Continue bupropion SR 100 mg BID.  Follow up with psychiatry as scheduled. Recommended she ask for a referral for therapy from her new psychiatrist.  Our therapy group declined her referral.           Pleas Koch, NP

## 2022-01-29 ENCOUNTER — Other Ambulatory Visit: Payer: Self-pay | Admitting: Student in an Organized Health Care Education/Training Program

## 2022-01-29 DIAGNOSIS — G894 Chronic pain syndrome: Secondary | ICD-10-CM

## 2022-01-29 DIAGNOSIS — G609 Hereditary and idiopathic neuropathy, unspecified: Secondary | ICD-10-CM
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
## 2022-02-01 DIAGNOSIS — R030 Elevated blood-pressure reading, without diagnosis of hypertension: Secondary | ICD-10-CM | POA: Diagnosis not present

## 2022-02-01 DIAGNOSIS — Z8249 Family history of ischemic heart disease and other diseases of the circulatory system: Secondary | ICD-10-CM | POA: Diagnosis not present

## 2022-02-01 DIAGNOSIS — R32 Unspecified urinary incontinence: Secondary | ICD-10-CM | POA: Diagnosis not present

## 2022-02-01 DIAGNOSIS — Z882 Allergy status to sulfonamides status: Secondary | ICD-10-CM | POA: Diagnosis not present

## 2022-02-01 DIAGNOSIS — R69 Illness, unspecified: Secondary | ICD-10-CM | POA: Diagnosis not present

## 2022-02-01 DIAGNOSIS — Z818 Family history of other mental and behavioral disorders: Secondary | ICD-10-CM | POA: Diagnosis not present

## 2022-02-01 DIAGNOSIS — E039 Hypothyroidism, unspecified: Secondary | ICD-10-CM | POA: Diagnosis not present

## 2022-02-01 DIAGNOSIS — K219 Gastro-esophageal reflux disease without esophagitis: Secondary | ICD-10-CM | POA: Diagnosis not present

## 2022-02-01 DIAGNOSIS — G4700 Insomnia, unspecified: Secondary | ICD-10-CM | POA: Diagnosis not present

## 2022-02-01 DIAGNOSIS — Z823 Family history of stroke: Secondary | ICD-10-CM | POA: Diagnosis not present

## 2022-02-01 DIAGNOSIS — Z809 Family history of malignant neoplasm, unspecified: Secondary | ICD-10-CM | POA: Diagnosis not present

## 2022-02-02 ENCOUNTER — Ambulatory Visit (INDEPENDENT_AMBULATORY_CARE_PROVIDER_SITE_OTHER): Payer: 59 | Admitting: Psychiatry

## 2022-02-02 ENCOUNTER — Encounter: Payer: Self-pay | Admitting: Psychiatry

## 2022-02-02 VITALS — BP 114/78 | HR 90 | Temp 97.5°F | Ht 59.0 in | Wt 102.8 lb

## 2022-02-02 DIAGNOSIS — F4310 Post-traumatic stress disorder, unspecified: Secondary | ICD-10-CM

## 2022-02-02 DIAGNOSIS — R69 Illness, unspecified: Secondary | ICD-10-CM | POA: Diagnosis not present

## 2022-02-02 DIAGNOSIS — F0630 Mood disorder due to known physiological condition, unspecified: Secondary | ICD-10-CM | POA: Diagnosis not present

## 2022-02-02 MED ORDER — ARIPIPRAZOLE 2 MG PO TABS: 2.0000 mg | ORAL_TABLET | Freq: Every day | ORAL | 1 refills | Status: DC

## 2022-02-02 NOTE — Patient Instructions (Signed)
Continue duloxetine 90 mg daily,  Continue bupropion 100 mg twice a day ( Start Abilify 2 mg daily  Next appointment: 1/9 at 1 PM

## 2022-02-06 ENCOUNTER — Other Ambulatory Visit: Payer: Self-pay | Admitting: Primary Care

## 2022-02-06 ENCOUNTER — Other Ambulatory Visit: Payer: Self-pay | Admitting: Family

## 2022-02-06 DIAGNOSIS — F419 Anxiety disorder, unspecified: Secondary | ICD-10-CM

## 2022-02-06 DIAGNOSIS — M62838 Other muscle spasm: Secondary | ICD-10-CM

## 2022-02-06 DIAGNOSIS — G43111 Migraine with aura, intractable, with status migrainosus: Secondary | ICD-10-CM

## 2022-02-06 DIAGNOSIS — G43709 Chronic migraine without aura, not intractable, without status migrainosus: Secondary | ICD-10-CM

## 2022-02-15 ENCOUNTER — Ambulatory Visit (INDEPENDENT_AMBULATORY_CARE_PROVIDER_SITE_OTHER): Payer: 59 | Admitting: Licensed Clinical Social Worker

## 2022-02-15 DIAGNOSIS — R69 Illness, unspecified: Secondary | ICD-10-CM | POA: Diagnosis not present

## 2022-02-15 DIAGNOSIS — F0630 Mood disorder due to known physiological condition, unspecified: Secondary | ICD-10-CM | POA: Diagnosis not present

## 2022-02-15 DIAGNOSIS — F4310 Post-traumatic stress disorder, unspecified: Secondary | ICD-10-CM
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
## 2022-02-16 ENCOUNTER — Telehealth: Payer: Self-pay | Admitting: Psychiatry

## 2022-02-16 NOTE — Telephone Encounter (Signed)
Received a packet from [place]. The most recent/relevant information to be scanned Per chart review, he has a history of depression, and was admitted due to [place]. This writer could not find any documentation to support diagnosis of bipolar disorder.

## 2022-02-20 ENCOUNTER — Other Ambulatory Visit: Payer: Self-pay | Admitting: Primary Care

## 2022-02-20 DIAGNOSIS — M79671 Pain in right foot: Secondary | ICD-10-CM

## 2022-02-25 ENCOUNTER — Other Ambulatory Visit: Payer: Self-pay | Admitting: Psychiatry

## 2022-03-03 ENCOUNTER — Ambulatory Visit: Payer: No Typology Code available for payment source | Admitting: Licensed Clinical Social Worker

## 2022-03-04 ENCOUNTER — Encounter: Payer: Self-pay | Admitting: Gastroenterology

## 2022-03-04 ENCOUNTER — Ambulatory Visit (INDEPENDENT_AMBULATORY_CARE_PROVIDER_SITE_OTHER): Payer: 59 | Admitting: Gastroenterology

## 2022-03-04 VITALS — BP 127/77 | HR 71 | Temp 97.6°F | Ht 59.0 in | Wt 99.0 lb

## 2022-03-04 DIAGNOSIS — R933 Abnormal findings on diagnostic imaging of other parts of digestive tract: Secondary | ICD-10-CM | POA: Diagnosis not present

## 2022-03-04 NOTE — Addendum Note (Signed)
Addended by: Lurlean Nanny on: 03/04/2022 05:40 PM   Modules accepted: Orders

## 2022-03-04 NOTE — H&P (View-Only) (Signed)
Primary Care Physician: Pleas Koch, NP  Primary Gastroenterologist:  Dr. [name]  Chief Complaint  Patient presents with   Nausea    Pt reports nausea when she tries to take medications...      HPI: Tanya Nguyen is a 56 y.o. female here With a history of peptic ulcer disease requiring surgery.  The patient states that she had a CT scan showing fluid in her small bowel and fluid in her stomach.  There is no report of any unexplained weight loss.  The patient denies any abdominal pain nausea vomiting.  The patient does report that when she puts pills in her mouth it is hard to initiate getting the pill faster stomach to go down her esophagus but does not have any report of the food not going down her esophagus.  There is no report of any black stools or bloody stools.  Past Medical History:  Diagnosis Date   Acquired pyloric stricture    Bleeding duodenal ulcer    CAP (community acquired pneumonia) 02/12/2020   Chronic esophagogastric ulcer    Depression    Duodenal obstruction    Gastric outlet obstruction 01/19/2018   GERD (gastroesophageal reflux disease)    Hair loss 09/22/2018   History of stomach ulcers    Hypothyroidism    Incisional hernia, without obstruction or gangrene    Intractable vomiting    Kidney stones    Melena 05/08/2021   Migraine    Migraine headache    only1-2x/month since starting topamax   Neuropathy    Side pain 03/30/2018   Upper GI bleed 05/18/2021    Current Outpatient Medications  Medication Sig Dispense Refill   ARIPiprazole (ABILIFY) 2 MG tablet Take 1 tablet (2 mg total) by mouth at bedtime. 30 tablet 1   buPROPion ER (WELLBUTRIN SR) 100 MG 12 hr tablet Take 1 tablet (100 mg total) by mouth 2 (two) times daily. For depression. 180 tablet 0   DULoxetine (CYMBALTA) 30 MG capsule TAKE 1 CAPSULE (30 MG TOTAL) BY MOUTH DAILY. FOR ANXIETY AND PAIN. TAKE WITH 60 MG. 90 capsule 3   DULoxetine (CYMBALTA) 60 MG capsule TAKE 1 CAPSULE (60 MG  TOTAL) BY MOUTH DAILY. FOR ANXIETY AND PAIN. 90 capsule 1   ferrous sulfate 325 (65 FE) MG tablet Take 1 tablet (325 mg total) by mouth every other day. 30 tablet 0   levothyroxine (SYNTHROID) 88 MCG tablet TAKE 1 TABLET BY MOUTH EVERY MORNING ON EMPTY STOMACH WITH WATER ONLY-NO FOOD OR OTHER MED FOR 30MIN 90 tablet 2   oxybutynin (DITROPAN-XL) 5 MG 24 hr tablet TAKE 1 TABLET (5 MG TOTAL) BY MOUTH AT BEDTIME. FOR BLADDER INCONTINENCE. 90 tablet 2   pantoprazole (PROTONIX) 40 MG tablet Take 1 tablet (40 mg total) by mouth daily. 30 tablet 0   SUMAtriptan (IMITREX) 50 MG tablet TAKE 1 TABLET BY MOUTH AT MIGRAINE ONSET. MAY REPEAT IN 2 HOURS IF HEADACHE PERSISTS OR RECURS. DO NOT EXCEED 2 TABLETS IN 24 HOURS. 9 tablet 0   topiramate (TOPAMAX) 100 MG tablet TAKE 1 TABLET (100 MG TOTAL) BY MOUTH AT BEDTIME. FOR MIGRAINE PREVENTION. 90 tablet 3   topiramate (TOPAMAX) 25 MG tablet TAKE 1 TABLET (25 MG TOTAL) BY MOUTH AT BEDTIME. FOR MIGRAINES. TAKE WITH 100 MG DOSE. 90 tablet 3   traMADol (ULTRAM) 50 MG tablet Take 1 tablet (50 mg total) by mouth every 12 (twelve) hours as needed. 60 tablet 2   traZODone (DESYREL) 50 MG  tablet Take 3 tablets (150 mg total) by mouth at bedtime. For sleep. 270 tablet 2   No current facility-administered medications for this visit.    Allergies as of 03/04/2022 - Review Complete 03/04/2022  Allergen Reaction Noted   Penicillins Shortness Of Breath, Diarrhea, and Nausea And Vomiting 02/26/2015   Nsaids Other (See Comments) 06/26/2012   Gabapentin Other (See Comments) 11/14/2018   Pregabalin  04/21/2020   Sulfa antibiotics Nausea And Vomiting 06/26/2012    ROS:  General: Negative for anorexia, weight loss, fever, chills, fatigue, weakness. ENT: Negative for hoarseness, difficulty swallowing , nasal congestion. CV: Negative for chest pain, angina, palpitations, dyspnea on exertion, peripheral edema.  Respiratory: Negative for dyspnea at rest, dyspnea on exertion,  cough, sputum, wheezing.  GI: See history of present illness. GU:  Negative for dysuria, hematuria, urinary incontinence, urinary frequency, nocturnal urination.  Endo: Negative for unusual weight change.    Physical Examination:   BP 127/77 (BP Location: Left Arm, Patient Position: Sitting, Cuff Size: Normal)   Pulse 71   Temp 97.6 F (36.4 C) (Oral)   Ht '4\' 11"'$  (1.499 m)   Wt 99 lb (44.9 kg)   BMI 20.00 kg/m   General: Well-nourished, well-developed in no acute distress.  Eyes: No icterus. Conjunctivae pink. Lungs: Clear to auscultation bilaterally. Non-labored. Heart: Regular rate and rhythm, no murmurs rubs or gallops.  Abdomen: Bowel sounds are normal, nontender, nondistended, no hepatosplenomegaly or masses, no abdominal bruits or hernia , no rebound or guarding.   Extremities: No lower extremity edema. No clubbing or deformities. Neuro: Alert and oriented x 3.  Grossly intact. Skin: Warm and dry, no jaundice.   Psych: Alert and cooperative, normal mood and affect.  Labs:    Imaging Studies: No results found.  Assessment and Plan:   [name] is a 56 y.o. y/o female who comes in today with a history of peptic ulcer disease with surgery. 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  The patient had a CT scan showing thickening of the pylorus with retained food in the stomach and was recommended to see GI.  The patient will be set up for an upper endoscopy to rule out any pathology as the cause of her retained fluid in the stomach.  The patient has been the plan and agrees with it.     [name], MD. [name]    Note: This dictation was prepared with Dragon dictation along with smaller phrase technology. Any transcriptional errors that result from this process are unintentional.

## 2022-03-04 NOTE — Progress Notes (Signed)
Primary Care Physician: Pleas Koch, NP  Primary Gastroenterologist:  Dr. [name]  Chief Complaint  Patient presents with   Nausea    Pt reports nausea when she tries to take medications...      HPI: Tanya Nguyen is a 56 y.o. female here With a history of peptic ulcer disease requiring surgery.  The patient states that she had a CT scan showing fluid in her small bowel and fluid in her stomach.  There is no report of any unexplained weight loss.  The patient denies any abdominal pain nausea vomiting.  The patient does report that when she puts pills in her mouth it is hard to initiate getting the pill faster stomach to go down her esophagus but does not have any report of the food not going down her esophagus.  There is no report of any black stools or bloody stools.  Past Medical History:  Diagnosis Date   Acquired pyloric stricture    Bleeding duodenal ulcer    CAP (community acquired pneumonia) 02/12/2020   Chronic esophagogastric ulcer    Depression    Duodenal obstruction    Gastric outlet obstruction 01/19/2018   GERD (gastroesophageal reflux disease)    Hair loss 09/22/2018   History of stomach ulcers    Hypothyroidism    Incisional hernia, without obstruction or gangrene    Intractable vomiting    Kidney stones    Melena 05/08/2021   Migraine    Migraine headache    only1-2x/month since starting topamax   Neuropathy    Side pain 03/30/2018   Upper GI bleed 05/18/2021    Current Outpatient Medications  Medication Sig Dispense Refill   ARIPiprazole (ABILIFY) 2 MG tablet Take 1 tablet (2 mg total) by mouth at bedtime. 30 tablet 1   buPROPion ER (WELLBUTRIN SR) 100 MG 12 hr tablet Take 1 tablet (100 mg total) by mouth 2 (two) times daily. For depression. 180 tablet 0   DULoxetine (CYMBALTA) 30 MG capsule TAKE 1 CAPSULE (30 MG TOTAL) BY MOUTH DAILY. FOR ANXIETY AND PAIN. TAKE WITH 60 MG. 90 capsule 3   DULoxetine (CYMBALTA) 60 MG capsule TAKE 1 CAPSULE (60 MG  TOTAL) BY MOUTH DAILY. FOR ANXIETY AND PAIN. 90 capsule 1   ferrous sulfate 325 (65 FE) MG tablet Take 1 tablet (325 mg total) by mouth every other day. 30 tablet 0   levothyroxine (SYNTHROID) 88 MCG tablet TAKE 1 TABLET BY MOUTH EVERY MORNING ON EMPTY STOMACH WITH WATER ONLY-NO FOOD OR OTHER MED FOR 30MIN 90 tablet 2   oxybutynin (DITROPAN-XL) 5 MG 24 hr tablet TAKE 1 TABLET (5 MG TOTAL) BY MOUTH AT BEDTIME. FOR BLADDER INCONTINENCE. 90 tablet 2   pantoprazole (PROTONIX) 40 MG tablet Take 1 tablet (40 mg total) by mouth daily. 30 tablet 0   SUMAtriptan (IMITREX) 50 MG tablet TAKE 1 TABLET BY MOUTH AT MIGRAINE ONSET. MAY REPEAT IN 2 HOURS IF HEADACHE PERSISTS OR RECURS. DO NOT EXCEED 2 TABLETS IN 24 HOURS. 9 tablet 0   topiramate (TOPAMAX) 100 MG tablet TAKE 1 TABLET (100 MG TOTAL) BY MOUTH AT BEDTIME. FOR MIGRAINE PREVENTION. 90 tablet 3   topiramate (TOPAMAX) 25 MG tablet TAKE 1 TABLET (25 MG TOTAL) BY MOUTH AT BEDTIME. FOR MIGRAINES. TAKE WITH 100 MG DOSE. 90 tablet 3   traMADol (ULTRAM) 50 MG tablet Take 1 tablet (50 mg total) by mouth every 12 (twelve) hours as needed. 60 tablet 2   traZODone (DESYREL) 50 MG  tablet Take 3 tablets (150 mg total) by mouth at bedtime. For sleep. 270 tablet 2   No current facility-administered medications for this visit.    Allergies as of 03/04/2022 - Review Complete 03/04/2022  Allergen Reaction Noted   Penicillins Shortness Of Breath, Diarrhea, and Nausea And Vomiting 02/26/2015   Nsaids Other (See Comments) 06/26/2012   Gabapentin Other (See Comments) 11/14/2018   Pregabalin  04/21/2020   Sulfa antibiotics Nausea And Vomiting 06/26/2012    ROS:  General: Negative for anorexia, weight loss, fever, chills, fatigue, weakness. ENT: Negative for hoarseness, difficulty swallowing , nasal congestion. CV: Negative for chest pain, angina, palpitations, dyspnea on exertion, peripheral edema.  Respiratory: Negative for dyspnea at rest, dyspnea on exertion,  cough, sputum, wheezing.  GI: See history of present illness. GU:  Negative for dysuria, hematuria, urinary incontinence, urinary frequency, nocturnal urination.  Endo: Negative for unusual weight change.    Physical Examination:   BP 127/77 (BP Location: Left Arm, Patient Position: Sitting, Cuff Size: Normal)   Pulse 71   Temp 97.6 F (36.4 C) (Oral)   Ht '4\' 11"'$  (1.499 m)   Wt 99 lb (44.9 kg)   BMI 20.00 kg/m   General: Well-nourished, well-developed in no acute distress.  Eyes: No icterus. Conjunctivae pink. Lungs: Clear to auscultation bilaterally. Non-labored. Heart: Regular rate and rhythm, no murmurs rubs or gallops.  Abdomen: Bowel sounds are normal, nontender, nondistended, no hepatosplenomegaly or masses, no abdominal bruits or hernia , no rebound or guarding.   Extremities: No lower extremity edema. No clubbing or deformities. Neuro: Alert and oriented x 3.  Grossly intact. Skin: Warm and dry, no jaundice.   Psych: Alert and cooperative, normal mood and affect.  Labs:    Imaging Studies: No results found.  Assessment and Plan:   Tanya Nguyen is a 56 y.o. y/o female who comes in today with a history of peptic ulcer disease with surgery. 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  The patient had a CT scan showing thickening of the pylorus with retained food in the stomach and was recommended to see GI.  The patient will be set up for an upper endoscopy to rule out any pathology as the cause of her retained fluid in the stomach.  The patient has been the plan and agrees with it.     [name], MD. [name]    Note: This dictation was prepared with Dragon dictation along with smaller phrase technology. Any transcriptional errors that result from this process are unintentional.

## 2022-03-04 NOTE — Addendum Note (Signed)
Addended by: Lurlean Nanny on: 03/04/2022 05:47 PM   Modules accepted: Orders

## 2022-03-05 ENCOUNTER — Other Ambulatory Visit: Payer: Self-pay | Admitting: Primary Care

## 2022-03-05 DIAGNOSIS — F331 Major depressive disorder, recurrent, moderate: Secondary | ICD-10-CM

## 2022-03-05 DIAGNOSIS — F4310 Post-traumatic stress disorder, unspecified: Secondary | ICD-10-CM

## 2022-03-09 ENCOUNTER — Ambulatory Visit: Payer: Medicare HMO | Admitting: Certified Registered Nurse Anesthetist

## 2022-03-09 ENCOUNTER — Ambulatory Visit
Admission: RE | Admit: 2022-03-09 | Discharge: 2022-03-09 | Disposition: A | Discharge status: Home / Self Care | Payer: Medicare HMO | Attending: Gastroenterology | Admitting: Gastroenterology

## 2022-03-09 ENCOUNTER — Encounter: Payer: Self-pay | Admitting: Gastroenterology

## 2022-03-09 ENCOUNTER — Encounter
Admission: RE | Disposition: A | Discharge status: Home / Self Care | Payer: Self-pay | Source: Home / Self Care | Attending: Gastroenterology

## 2022-03-09 DIAGNOSIS — G43909 Migraine, unspecified, not intractable, without status migrainosus: Secondary | ICD-10-CM | POA: Diagnosis not present

## 2022-03-09 DIAGNOSIS — Z9049 Acquired absence of other specified parts of digestive tract: Secondary | ICD-10-CM | POA: Insufficient documentation

## 2022-03-09 DIAGNOSIS — K279 Peptic ulcer, site unspecified, unspecified as acute or chronic, without hemorrhage or perforation: Secondary | ICD-10-CM | POA: Insufficient documentation

## 2022-03-09 DIAGNOSIS — K219 Gastro-esophageal reflux disease without esophagitis: Secondary | ICD-10-CM | POA: Insufficient documentation

## 2022-03-09 DIAGNOSIS — R933 Abnormal findings on diagnostic imaging of other parts of digestive tract: Secondary | ICD-10-CM | POA: Diagnosis not present

## 2022-03-09 DIAGNOSIS — T183XXA Foreign body in small intestine, initial encounter: Secondary | ICD-10-CM | POA: Diagnosis not present

## 2022-03-09 DIAGNOSIS — F319 Bipolar disorder, unspecified: Secondary | ICD-10-CM | POA: Diagnosis not present

## 2022-03-09 DIAGNOSIS — F419 Anxiety disorder, unspecified: Secondary | ICD-10-CM | POA: Diagnosis not present

## 2022-03-09 DIAGNOSIS — E039 Hypothyroidism, unspecified: Secondary | ICD-10-CM | POA: Insufficient documentation

## 2022-03-09 DIAGNOSIS — R69 Illness, unspecified: Secondary | ICD-10-CM | POA: Diagnosis not present

## 2022-03-09 DIAGNOSIS — T182XXA Foreign body in stomach, initial encounter: Secondary | ICD-10-CM | POA: Diagnosis not present

## 2022-03-09 DIAGNOSIS — D649 Anemia, unspecified: Secondary | ICD-10-CM | POA: Diagnosis not present

## 2022-03-09 HISTORY — PX: ESOPHAGOGASTRODUODENOSCOPY (EGD) WITH PROPOFOL: SHX5813

## 2022-03-09 SURGERY — ESOPHAGOGASTRODUODENOSCOPY (EGD) WITH PROPOFOL
Anesthesia: General

## 2022-03-09 MED ORDER — PROPOFOL 500 MG/50ML IV EMUL
INTRAVENOUS | Status: DC | PRN
  Administered 2022-03-09: 160 ug/kg/min via INTRAVENOUS

## 2022-03-09 MED ORDER — LIDOCAINE HCL (CARDIAC) PF 100 MG/5ML IV SOSY
PREFILLED_SYRINGE | INTRAVENOUS | Status: DC | PRN
  Administered 2022-03-09: 80 mg via INTRAVENOUS

## 2022-03-09 MED ORDER — PROPOFOL 10 MG/ML IV BOLUS
INTRAVENOUS | Status: DC | PRN
  Administered 2022-03-09: 60 mg via INTRAVENOUS

## 2022-03-09 MED ORDER — SODIUM CHLORIDE 0.9 % IV SOLN: INTRAVENOUS | Status: DC

## 2022-03-09 NOTE — Anesthesia Preprocedure Evaluation (Addendum)
Anesthesia Evaluation  Patient identified by MRN, date of birth, ID band Patient awake    Reviewed: Allergy & Precautions, H&P , NPO status , reviewed documented beta blocker date and time   History of Anesthesia Complications Negative for: history of anesthetic complications  Airway Mallampati: III   Neck ROM: full    Dental  (+) Chipped, Dental Advidsory Given   Pulmonary neg pulmonary ROS, neg sleep apnea, neg COPD   Pulmonary exam normal        Cardiovascular Exercise Tolerance: Good (-) angina (-) Past MI and (-) CABG negative cardio ROS Normal cardiovascular exam     Neuro/Psych  Headaches, neg Seizures PSYCHIATRIC DISORDERS Anxiety Depression Bipolar Disorder    Neuromuscular disease    GI/Hepatic Neg liver ROS, PUD,GERD  Medicated and Controlled,,  Endo/Other  neg diabetesHypothyroidism    Renal/GU Renal disease (kidney stones)     Musculoskeletal   Abdominal   Peds  Hematology  (+) Blood dyscrasia, anemia   Anesthesia Other Findings Past Medical History: No date: Acquired pyloric stricture No date: Bleeding duodenal ulcer 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 No date: Depression No date: Duodenal obstruction 01/19/2018: Gastric outlet obstruction No date: GERD (gastroesophageal reflux disease) No date: History of stomach ulcers No date: Hypothyroidism No date: Intractable vomiting No date: Kidney stones No date: Migraine No date: Migraine headache     Comment:  only1-2x/month since starting topamax Past Surgical History: 2004: ABDOMINAL HYSTERECTOMY     Comment:  partial No date: CHOLECYSTECTOMY 02/27/2018: COLONOSCOPY WITH PROPOFOL; N/A     Comment:  Procedure: COLONOSCOPY WITH PROPOFOL;  Surgeon: [name], MD;  Location: [place];  Service:               Endoscopy;  Laterality: N/A; 01/12/2018: ESOPHAGOGASTRODUODENOSCOPY (EGD) WITH PROPOFOL; N/A     Comment:   Procedure: ESOPHAGOGASTRODUODENOSCOPY (EGD) WITH               BIOPSIES;  Surgeon: [name], MD;  Location: [place];  Service: Endoscopy;  Laterality: N/A; 01/25/2018: ESOPHAGOGASTRODUODENOSCOPY (EGD) WITH PROPOFOL; N/A     Comment:  Procedure: ESOPHAGOGASTRODUODENOSCOPY (EGD) WITH               PROPOFOL;  Surgeon: Jonathon Bellows, MD;  Location: [facility]               ENDOSCOPY;  Service: Gastroenterology;  Laterality: N/A; 02/27/2018: ESOPHAGOGASTRODUODENOSCOPY (EGD) WITH PROPOFOL; N/A     Comment:  Procedure: ESOPHAGOGASTRODUODENOSCOPY (EGD) WITH               PROPOFOL;  Surgeon: [name], MD;  Location: [place];  Service: Endoscopy;  Laterality: N/A; 01/20/2018: LAPAROTOMY; N/A     Comment:  Procedure: EXPLORATORY LAPAROTOMY;  Surgeon: Olean Ree, MD;  Location: [facility];  Service: General;                Laterality: N/A; No date: TOOTH EXTRACTION   Reproductive/Obstetrics                             Anesthesia Physical Anesthesia Plan  ASA: 3  Anesthesia Plan:  General   Post-op Pain Management: Minimal or no pain anticipated   Induction: Intravenous  PONV Risk Score and Plan: 3 and Propofol infusion and TIVA  Airway Management Planned: Nasal Cannula  Additional Equipment: None  Intra-op Plan:   Post-operative Plan:   Informed Consent: I have reviewed the patients History and Physical, chart, labs and discussed the procedure including the risks, benefits and alternatives for the proposed anesthesia with the patient or authorized representative who has indicated his/her understanding and acceptance.     Dental Advisory Given  Plan Discussed with: CRNA  Anesthesia Plan Comments: (Discussed risks of anesthesia with patient, including possibility of difficulty with spontaneous ventilation under anesthesia necessitating airway intervention, PONV, and rare risks such as cardiac  or respiratory or neurological events, and allergic reactions. Discussed the role of CRNA in patient's perioperative care. Patient understands.)        Anesthesia Quick Evaluation

## 2022-03-09 NOTE — Op Note (Addendum)
[facility] Gastroenterology Patient Name: Tanya Nguyen Procedure Date: 03/09/2022 7:11 AM MRN: [id number] Account #: [id number] Date of Birth: 1965/07/14 Admit Type: Outpatient Age: 57 Room: [facility] ENDO ROOM 4 Gender: Female Note Status: Finalized Instrument Name: Upper Endoscope [id number] Procedure:             Upper GI endoscopy Indications:           Abnormal CT of the GI tract Providers:             [name] MD, MD Referring MD:          Pleas Koch (Referring MD) Medicines:             Propofol per Anesthesia Complications:         No immediate complications. Procedure:             Pre-Anesthesia Assessment:                        - Prior to the procedure, a History and Physical was                         performed, and patient medications and allergies were                         reviewed. The patient's tolerance of previous                         anesthesia was also reviewed. The risks and benefits                         of the procedure and the sedation options and risks                         were discussed with the patient. All questions were                         answered, and informed consent was obtained. Prior                         Anticoagulants: The patient has taken no anticoagulant                         or antiplatelet agents. ASA Grade Assessment: II - A                         patient with mild systemic disease. After reviewing                         the risks and benefits, the patient was deemed in                         satisfactory condition to undergo the procedure.                        After obtaining informed consent, the endoscope was                         passed under direct vision. Throughout the procedure,  the patient's blood pressure, pulse, and oxygen                         saturations were monitored continuously. The Endoscope                         was introduced through the mouth,  and advanced to the                         jejunum. The upper GI endoscopy was accomplished                         without difficulty. The patient tolerated the                         procedure well. Findings:      The examined esophagus was normal.      A medium amount of food (residue) was found in the entire examined       stomach.      Evidence of a gastrojejunostomy was found in the gastric body.      The jejunum contained a foreign body.      Biopsies were taken with a cold forceps in the gastric antrum for       histology. Impression:            - Normal esophagus.                        - A medium amount of food (residue) in the stomach.                        - A gastrojejunostomy was found.                        - Foreign body in the jejunum.                        - Biopsies were taken with a cold forceps for                         histology in the gastric antrum. Recommendation:        - Discharge patient to home.                        - Resume previous diet.                        - Continue present medications.                        - Perform an upper GI series and small bowel follow                         through at appointment to be scheduled. Procedure Code(s):     --- Professional ---                        [phone], Esophagogastroduodenoscopy, flexible,                         transoral; with  biopsy, single or multiple Diagnosis Code(s):     --- Professional ---                        R93.3, Abnormal findings on diagnostic imaging of                         other parts of digestive tract CPT copyright 2022 American Medical Association. All rights reserved. The codes documented in this report are preliminary and upon coder review may  be revised to meet current compliance requirements. [name] MD, MD 03/09/2022 8:43:19 AM This report has been signed electronically. Number of Addenda: 0 Note Initiated On: 03/09/2022 7:11 AM Estimated Blood Loss:  Estimated  blood loss: none.      [facility]

## 2022-03-09 NOTE — Anesthesia Postprocedure Evaluation (Signed)
Anesthesia Post Note  Patient: Tanya Nguyen  Procedure(s) Performed: ESOPHAGOGASTRODUODENOSCOPY (EGD) WITH PROPOFOL  Patient location during evaluation: Endoscopy Anesthesia Type: General Level of consciousness: awake and alert Pain management: pain level controlled Vital Signs Assessment: post-procedure vital signs reviewed and stable Respiratory status: spontaneous breathing, nonlabored ventilation, respiratory function stable and patient connected to nasal cannula oxygen Cardiovascular status: blood pressure returned to baseline and stable Postop Assessment: no apparent nausea or vomiting Anesthetic complications: no  No notable events documented.   Last Vitals:  Vitals:   03/09/22 0836 03/09/22 0856  BP: 117/78 112/75  Pulse: 93 88  Resp: 15 16  Temp:    SpO2: 100% 100%    Last Pain:  Vitals:   03/09/22 0856  TempSrc:   PainSc: 0-No pain                 Dimas Millin

## 2022-03-09 NOTE — Anesthesia Procedure Notes (Signed)
Date/Time: 03/09/2022 8:32 AM  Performed by: Demetrius Charity, CRNAPre-anesthesia Checklist: Patient identified, Emergency Drugs available, Suction available, Patient being monitored and Timeout performed Patient Re-evaluated:Patient Re-evaluated prior to induction Oxygen Delivery Method: Nasal cannula Induction Type: IV induction Placement Confirmation: CO2 detector and positive ETCO2

## 2022-03-09 NOTE — Interval H&P Note (Signed)
Tanya Lame, MD [facility] [address] [place], Tanya Nguyen [address] Phone:[phone] Fax : [phone]  Primary Care Physician:  Pleas Koch, NP Primary Gastroenterologist:  Dr. Allen Norris  Pre-Procedure History & Physical: HPI:  Tanya Nguyen is a 56 y.o. female is here for an endoscopy.   Past Medical History:  Diagnosis Date   Acquired pyloric stricture    Bleeding duodenal ulcer    CAP (community acquired pneumonia) 02/12/2020   Chronic esophagogastric ulcer    Depression    Duodenal obstruction    Gastric outlet obstruction 01/19/2018   GERD (gastroesophageal reflux disease)    Hair loss 09/22/2018   History of stomach ulcers    Hypothyroidism    Incisional hernia, without obstruction or gangrene    Intractable vomiting    Kidney stones    Melena 05/08/2021   Migraine    Migraine headache    only1-2x/month since starting topamax   Neuropathy    Side pain 03/30/2018   Upper GI bleed 05/18/2021    Past Surgical History:  Procedure Laterality Date   ABDOMINAL HYSTERECTOMY  2004   partial   CHOLECYSTECTOMY     COLONOSCOPY WITH PROPOFOL N/A 02/27/2018   Procedure: COLONOSCOPY WITH PROPOFOL;  Surgeon: Tanya Lame, MD;  Location: [place];  Service: Endoscopy;  Laterality: N/A;   ESOPHAGOGASTRODUODENOSCOPY (EGD) WITH PROPOFOL N/A 01/12/2018   Procedure: ESOPHAGOGASTRODUODENOSCOPY (EGD) WITH BIOPSIES;  Surgeon: Tanya Lame, MD;  Location: [place];  Service: Endoscopy;  Laterality: N/A;   ESOPHAGOGASTRODUODENOSCOPY (EGD) WITH PROPOFOL N/A 01/25/2018   Procedure: ESOPHAGOGASTRODUODENOSCOPY (EGD) WITH PROPOFOL;  Surgeon: Jonathon Bellows, MD;  Location: [facility] ENDOSCOPY;  Service: Gastroenterology;  Laterality: N/A;   ESOPHAGOGASTRODUODENOSCOPY (EGD) WITH PROPOFOL N/A 02/27/2018   Procedure: ESOPHAGOGASTRODUODENOSCOPY (EGD) WITH PROPOFOL;  Surgeon: Tanya Lame, MD;  Location: [place];  Service: Endoscopy;  Laterality: N/A;    ESOPHAGOGASTRODUODENOSCOPY (EGD) WITH PROPOFOL N/A 05/09/2021   Procedure: ESOPHAGOGASTRODUODENOSCOPY (EGD) WITH PROPOFOL;  Surgeon: Annamaria Helling, DO;  Location: [place];  Service: Gastroenterology;  Laterality: N/A;   INCISIONAL HERNIA REPAIR N/A 11/23/2018   Procedure: HERNIA REPAIR INCISIONAL;  Surgeon: Olean Ree, MD;  Location: [facility];  Service: General;  Laterality: N/A;   INSERTION OF MESH N/A 11/23/2018   Procedure: INSERTION OF MESH;  Surgeon: Olean Ree, MD;  Location: [facility];  Service: General;  Laterality: N/A;   LAPAROTOMY N/A 01/20/2018   Procedure: EXPLORATORY LAPAROTOMY;  Surgeon: Olean Ree, MD;  Location: [facility];  Service: General;  Laterality: N/A;   TOOTH EXTRACTION      Prior to Admission medications   Medication Sig Start Date End Date Taking? Authorizing Provider  ARIPiprazole (ABILIFY) 2 MG tablet Take 1 tablet (2 mg total) by mouth at bedtime. 02/02/22 04/03/22 Yes Hisada, Elie Goody, MD  buPROPion ER (WELLBUTRIN SR) 100 MG 12 hr tablet TAKE 1 TABLET (100 MG TOTAL) BY MOUTH 2 (TWO) TIMES DAILY. FOR DEPRESSION. 03/05/22  Yes Pleas Koch, NP  DULoxetine (CYMBALTA) 30 MG capsule TAKE 1 CAPSULE (30 MG TOTAL) BY MOUTH DAILY. FOR ANXIETY AND PAIN. TAKE WITH 60 MG. 11/27/21  Yes Pleas Koch, NP  ferrous sulfate 325 (65 FE) MG tablet Take 1 tablet (325 mg total) by mouth every other day. 05/12/21  Yes Jennye Boroughs, MD  levothyroxine (SYNTHROID) 88 MCG tablet TAKE 1 TABLET BY MOUTH EVERY MORNING ON EMPTY STOMACH WITH WATER ONLY-NO FOOD OR OTHER MED FOR 30MIN 01/06/22  Yes Pleas Koch, NP  pantoprazole (PROTONIX) 40  MG tablet Take 1 tablet (40 mg total) by mouth daily. 05/18/21  Yes Michela Pitcher, NP  SUMAtriptan (IMITREX) 50 MG tablet TAKE 1 TABLET BY MOUTH AT MIGRAINE ONSET. MAY REPEAT IN 2 HOURS IF HEADACHE PERSISTS OR RECURS. DO NOT EXCEED 2 TABLETS IN 24 HOURS. 02/07/22  Yes Pleas Koch, NP  traMADol (ULTRAM) 50 MG tablet Take 1  tablet (50 mg total) by mouth every 12 (twelve) hours as needed. 09/17/21  Yes Gillis Santa, MD  DULoxetine (CYMBALTA) 60 MG capsule TAKE 1 CAPSULE (60 MG TOTAL) BY MOUTH DAILY. FOR ANXIETY AND PAIN. 09/21/21   Pleas Koch, NP  oxybutynin (DITROPAN-XL) 5 MG 24 hr tablet TAKE 1 TABLET (5 MG TOTAL) BY MOUTH AT BEDTIME. FOR BLADDER INCONTINENCE. 01/11/22   Pleas Koch, NP  topiramate (TOPAMAX) 100 MG tablet TAKE 1 TABLET (100 MG TOTAL) BY MOUTH AT BEDTIME. FOR MIGRAINE PREVENTION. 11/27/21   Pleas Koch, NP  topiramate (TOPAMAX) 25 MG tablet TAKE 1 TABLET (25 MG TOTAL) BY MOUTH AT BEDTIME. FOR MIGRAINES. TAKE WITH 100 MG DOSE. 01/04/22   Pleas Koch, NP  traZODone (DESYREL) 50 MG tablet Take 3 tablets (150 mg total) by mouth at bedtime. For sleep. 03/04/21   Pleas Koch, NP    Allergies as of 03/05/2022 - Review Complete 03/04/2022  Allergen Reaction Noted   Penicillins Shortness Of Breath, Diarrhea, and Nausea And Vomiting 02/26/2015   Nsaids Other (See Comments) 06/26/2012   Gabapentin Other (See Comments) 11/14/2018   Pregabalin  04/21/2020   Sulfa antibiotics Nausea And Vomiting 06/26/2012    Family History  Problem Relation Age of Onset   Cancer Mother    Breast cancer Mother 42   Lung cancer Father    Drug abuse Sister    Alcohol abuse Sister    Bipolar disorder Sister    Alcohol abuse Maternal Grandmother    Stroke Maternal Grandmother    Stroke Paternal Grandmother     Social History   Socioeconomic History   Marital status: Married    Spouse name: Not on file   Number of children: 0   Years of education: 12   Highest education level: High school graduate  Occupational History   Occupation: Homemaker  Tobacco Use   Smoking status: Never   Smokeless tobacco: Never  Vaping Use   Vaping Use: Never used  Substance and Sexual Activity   Alcohol use: Not Currently    Alcohol/week: 0.0 standard drinks of alcohol   Drug use: No   Sexual  activity: Not Currently    Birth control/protection: None  Other Topics Concern   Not on file  Social History Narrative   Lives at home with her husband.   Right-handed.   Rare caffeine.   Social Determinants of Health   Financial Resource Strain: Not on file  Food Insecurity: Not on file  Transportation Needs: Not on file  Physical Activity: Not on file  Stress: Not on file  Social Connections: Not on file  Intimate Partner Violence: Not on file    Review of Systems: See HPI, otherwise negative ROS  Physical Exam: BP 117/75   Pulse 75   Temp 98.1 F (36.7 C) (Temporal)   Resp 16   Wt 44.2 kg   SpO2 100%   BMI 19.67 kg/m  General:   Alert,  pleasant and cooperative in NAD Head:  Normocephalic and atraumatic. Neck:  Supple; no masses or thyromegaly. Lungs:  Clear throughout to auscultation.  Heart:  Regular rate and rhythm. Abdomen:  Soft, nontender and nondistended. Normal bowel sounds, without guarding, and without rebound.   Neurologic:  Alert and  oriented x4;  grossly normal neurologically.  Impression/Plan: Chemere Steffler is here for an endoscopy to be performed for abnormal imagining of the stomach.  Risks, benefits, limitations, and alternatives regarding  endoscopy have been reviewed with the patient.  Questions have been answered.  All parties agreeable.   Tanya Lame, MD  03/09/2022, 8:18 AM

## 2022-03-09 NOTE — Transfer of Care (Signed)
Immediate Anesthesia Transfer of Care Note  Patient: Tanya Nguyen  Procedure(s) Performed: ESOPHAGOGASTRODUODENOSCOPY (EGD) WITH PROPOFOL  Patient Location: PACU and Endoscopy Unit  Anesthesia Type:General  Level of Consciousness: awake and alert   Airway & Oxygen Therapy: Patient Spontanous Breathing  Post-op Assessment: Report given to RN and Post -op Vital signs reviewed and stable  Post vital signs: Reviewed and stable  Last Vitals:  Vitals Value Taken Time  BP    Temp    Pulse    Resp    SpO2      Last Pain:  Vitals:   03/09/22 0743  TempSrc: Temporal         Complications: No notable events documented.

## 2022-03-10 ENCOUNTER — Encounter: Payer: Self-pay | Admitting: Gastroenterology

## 2022-03-10 ENCOUNTER — Other Ambulatory Visit: Payer: Self-pay

## 2022-03-10 DIAGNOSIS — R1310 Dysphagia, unspecified: Secondary | ICD-10-CM

## 2022-03-10 LAB — SURGICAL PATHOLOGY

## 2022-03-11 ENCOUNTER — Other Ambulatory Visit: Payer: Self-pay | Admitting: Primary Care

## 2022-03-11 NOTE — Telephone Encounter (Signed)
Please call patient:  She is seeing psychiatry now so can we update her medication list? Is she still taking 150 mg of Trazodone at bedtime? (three 50 mg tablets)

## 2022-03-11 NOTE — Telephone Encounter (Signed)
Unable to reach patient. Left voicemail to return call to our office.   

## 2022-03-12 ENCOUNTER — Other Ambulatory Visit: Payer: Self-pay | Admitting: Primary Care

## 2022-03-12 ENCOUNTER — Other Ambulatory Visit: Payer: Self-pay | Admitting: Psychiatry

## 2022-03-12 DIAGNOSIS — G43111 Migraine with aura, intractable, with status migrainosus: Secondary | ICD-10-CM

## 2022-03-12 MED ORDER — TRAZODONE HCL 50 MG PO TABS: 150.0000 mg | ORAL_TABLET | Freq: Every evening | ORAL | 0 refills | Status: DC | PRN

## 2022-03-12 NOTE — Telephone Encounter (Signed)
Patient returned call,I asked was she still taking the trazadone,she stated that she is,however she hasn't been able to take any of her medication due to gagging. She is scheduled to get her esophagus stretched next Wednesday 03/17/22 at [facility].

## 2022-03-12 NOTE — Telephone Encounter (Signed)
Ordered refill.

## 2022-03-12 NOTE — Telephone Encounter (Signed)
Can we send the Trazodone refill request to her psychiatrist?
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
## 2022-03-16 ENCOUNTER — Ambulatory Visit: Payer: No Typology Code available for payment source | Admitting: Psychiatry

## 2022-03-16 ENCOUNTER — Ambulatory Visit
Payer: Medicare HMO | Attending: Student in an Organized Health Care Education/Training Program | Admitting: Student in an Organized Health Care Education/Training Program

## 2022-03-16 ENCOUNTER — Encounter: Payer: Self-pay | Admitting: Student in an Organized Health Care Education/Training Program

## 2022-03-16 VITALS — BP 118/69 | HR 71 | Temp 97.2°F | Resp 16 | Ht 59.0 in | Wt 96.0 lb

## 2022-03-16 DIAGNOSIS — F4310 Post-traumatic stress disorder, unspecified: Secondary | ICD-10-CM

## 2022-03-16 DIAGNOSIS — M722 Plantar fascial fibromatosis: Secondary | ICD-10-CM | POA: Diagnosis not present

## 2022-03-16 DIAGNOSIS — G894 Chronic pain syndrome: Secondary | ICD-10-CM

## 2022-03-16 DIAGNOSIS — R69 Illness, unspecified: Secondary | ICD-10-CM | POA: Diagnosis not present

## 2022-03-16 DIAGNOSIS — G609 Hereditary and idiopathic neuropathy, unspecified: Secondary | ICD-10-CM | POA: Diagnosis not present

## 2022-03-16 MED ORDER — TRAMADOL HCL 50 MG PO TABS: 50.0000 mg | ORAL_TABLET | Freq: Two times a day (BID) | ORAL | 2 refills | Status: DC | PRN

## 2022-03-16 NOTE — Progress Notes (Signed)
Nursing Pain Medication Assessment:  Safety precautions to be maintained throughout the outpatient stay will include: orient to surroundings, keep bed in low position, maintain call bell within reach at all times, provide assistance with transfer out of bed and ambulation.  Medication Inspection Compliance: Pill count conducted under aseptic conditions, in front of the patient. Neither the pills nor the bottle was removed from the patient's sight at any time. Once count was completed pills were immediately returned to the patient in their original bottle.  Medication: Tramadol (Ultram) Pill/Patch Count:  8 of 46 pills remain Pill/Patch Appearance: Markings consistent with prescribed medication Bottle Appearance: Standard pharmacy container. Clearly labeled. Filled Date: 2 / 24 / 2023 Last Medication intake:  Today
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
## 2022-03-16 NOTE — Progress Notes (Signed)
PROVIDER NOTE: Information contained herein reflects review and annotations entered in association with encounter. Interpretation of such information and data should be left to medically-trained personnel. Information provided to patient can be located elsewhere in the medical record under "Patient Instructions". Document created using STT-dictation technology, any transcriptional errors that may result from process are unintentional.    Patient: Tanya Nguyen  Service Category: E/M  Provider: Gillis Santa, MD  DOB: 05-11-1965  DOS: 03/16/2022  Referring Provider: Pleas Koch, NP  MRN: [id number]  Specialty: Interventional Pain Management  PCP: Pleas Koch, NP  Type: Established Patient  Setting: Ambulatory outpatient    Location: Office  Delivery: Face-to-face     HPI  [name], a 57 y.o. year old female, is here today because of her Plantar fascial fibromatosis of both feet [M72.2]. [name] primary complain today is Leg Pain (Bilateral, peripheral neuropathy ) and Foot Pain (Bilateral ) Last encounter: My last encounter with her was on 12/10/21 Pertinent problems: [name] has Migraines; PTSD (post-traumatic stress disorder); Plantar fascial fibromatosis of both feet; Sacroiliac joint pain; Idiopathic peripheral neuropathy; Chronic pain syndrome; and Pain management contract signed on their pertinent problem list. Pain Assessment: Severity of Chronic pain is reported as a 0-No pain/10. Location: Leg Left, Right/into both feet. Onset: More than a month ago. Quality: Discomfort, Numbness, Tingling, Burning, Other (Comment) (redness on the soles of feet.). Timing: Intermittent. Modifying factor(s): medications and rest. Vitals:  height is '4\' 11"'$  (1.499 m) and weight is 96 lb (43.5 kg). Her temporal temperature is 97.2 F (36.2 C) (abnormal). Her blood pressure is 118/69 and her pulse is 71. Her respiration is 16 and oxygen saturation is 100%.   Reason for encounter:  No change  in medical history since last visit.  Patient's pain is at baseline.  Patient continues multimodal pain regimen as prescribed.  States that it provides pain relief and improvement in functional status.   09/17/2021: She states that her current dose of tramadol providing effective analgesic and functional benefit.  States that Dr. Amalia Nguyen her podiatrist prefers that tramadol be managed here.  We will increase dose to 50 mg twice daily for the purpose of obtaining better pain relief.  She will also sign a pain contract today.   03/13/2020: HPI from initial clinic visit below: Tanya Nguyen is a pleasant 57 year old female who presents with a chief complaint of bilateral foot pain related to plantar fasciitis and idiopathic peripheral neuropathy.  She describes her pain as burning and tingling has been chronic in nature.  She has been followed by podiatry in the past and has received steroid injections in both of her feet, bilateral heels.  She has been referred by Dr. Amalia Nguyen for medication management.  Patient is on tramadol and takes it 50 mg once or twice daily as needed.  She is also on Lyrica 75 mg twice a day and 225 mg nightly.  This is being managed by her primary care provider.  She is also on Cymbalta 30 mg as well as Zoloft 50 mg.  No symptoms concerning for serotonin syndrome such as agitation, nausea, diaphoresis, tachycardia, palpitations..  She would like to continue her tramadol and is      Pharmacotherapy Assessment  Analgesic: Tramadol 50 mg twice daily as needed  Monitoring: [place] PMP: PDMP reviewed during this encounter.       Pharmacotherapy: No side-effects or adverse reactions reported. Compliance: No problems identified. Effectiveness: Clinically acceptable.  Tanya Billow, RN  03/16/2022  10:21 AM  Sign when Signing Visit Nursing Pain Medication Assessment:  Safety precautions to be maintained throughout the outpatient stay will include: orient to surroundings, keep bed in low  position, maintain call bell within reach at all times, provide assistance with transfer out of bed and ambulation.  Medication Inspection Compliance: Pill count conducted under aseptic conditions, in front of the patient. Neither the pills nor the bottle was removed from the patient's sight at any time. Once count was completed pills were immediately returned to the patient in their original bottle.  Medication: Tramadol (Ultram) Pill/Patch Count:  8 of 46 pills remain Pill/Patch Appearance: Markings consistent with prescribed medication Bottle Appearance: Standard pharmacy container. Clearly labeled. Filled Date: 59 / 24 / 2023 Last Medication intake:  Today    No results found for: "CBDTHCR" No results found for: "D8THCCBX" No results found for: "D9THCCBX"  UDS:  Summary  Date Value Ref Range Status  03/13/2020 Note  Final    Comment:    ==================================================================== Compliance Drug Analysis, Ur ==================================================================== Test                             Result       Flag       Units  Drug Present and Declared for Prescription Verification   Tramadol                       >11905       EXPECTED   ng/mg creat   O-Desmethyltramadol            9229         EXPECTED   ng/mg creat   N-Desmethyltramadol            3952         EXPECTED   ng/mg creat    Source of tramadol is a prescription medication. O-desmethyltramadol    and N-desmethyltramadol are expected metabolites of tramadol.    Pregabalin                     PRESENT      EXPECTED   Topiramate                     PRESENT      EXPECTED   Cyclobenzaprine                PRESENT      EXPECTED   Desmethylcyclobenzaprine       PRESENT      EXPECTED    Desmethylcyclobenzaprine is an expected metabolite of    cyclobenzaprine.    Duloxetine                     PRESENT      EXPECTED   Trazodone                      PRESENT      EXPECTED   1,3  chlorophenyl piperazine    PRESENT      EXPECTED    1,3-chlorophenyl piperazine is an expected metabolite of trazodone.  Drug Present not Declared for Prescription Verification   Salicylate                     PRESENT      UNEXPECTED  Drug Absent but Declared for Prescription Verification   Sertraline  Not Detected UNEXPECTED ==================================================================== Test                      Result    Flag   Units      Ref Range   Creatinine              42               mg/dL      >=20 ==================================================================== Declared Medications:  The flagging and interpretation on this report are based on the  following declared medications.  Unexpected results may arise from  inaccuracies in the declared medications.   **Note: The testing scope of this panel includes these medications:   Cyclobenzaprine  Duloxetine  Pregabalin  Sertraline  Topiramate  Tramadol  Trazodone   **Note: The testing scope of this panel does not include the  following reported medications:   Cetirizine  Fluticasone  Levothyroxine  Pantoprazole  Sumatriptan ==================================================================== For clinical consultation, please call [phone]. ====================================================================       ROS  Constitutional: Denies any fever or chills Gastrointestinal: No reported hemesis, hematochezia, vomiting, or acute GI distress Musculoskeletal:  bilateral foot pain Neurological: No reported episodes of acute onset apraxia, aphasia, dysarthria, agnosia, amnesia, paralysis, loss of coordination, or loss of consciousness  Medication Review  ARIPiprazole, DULoxetine, SUMAtriptan, buPROPion ER, ferrous sulfate, levothyroxine, oxybutynin, pantoprazole, topiramate, traMADol, and traZODone  History Review  Allergy: Tanya Nguyen is allergic to penicillins, nsaids,  gabapentin, pregabalin, and sulfa antibiotics. Drug: Tanya Nguyen  reports no history of drug use. Alcohol:  reports that she does not currently use alcohol. Tobacco:  reports that she has never smoked. She has never used smokeless tobacco. Social: Tanya Nguyen  reports that she has never smoked. She has never used smokeless tobacco. She reports that she does not currently use alcohol. She reports that she does not use drugs. Medical:  has a past medical history of Acquired pyloric stricture, Bleeding duodenal ulcer, CAP (community acquired pneumonia) (02/12/2020), Chronic esophagogastric ulcer, Depression, Duodenal obstruction, Gastric outlet obstruction (01/19/2018), GERD (gastroesophageal reflux disease), Hair loss (09/22/2018), History of stomach ulcers, Hypothyroidism, Incisional hernia, without obstruction or gangrene, Intractable vomiting, Kidney stones, Melena (05/08/2021), Migraine, Migraine headache, Neuropathy, Side pain (03/30/2018), and Upper GI bleed (05/18/2021). Surgical: Tanya Nguyen  has a past surgical history that includes Abdominal hysterectomy (2004); Cholecystectomy; Tooth extraction; Esophagogastroduodenoscopy (egd) with propofol (N/A, 01/12/2018); laparotomy (N/A, 01/20/2018); Esophagogastroduodenoscopy (egd) with propofol (N/A, 01/25/2018); Colonoscopy with propofol (N/A, 02/27/2018); Esophagogastroduodenoscopy (egd) with propofol (N/A, 02/27/2018); Incisional hernia repair (N/A, 11/23/2018); Insertion of mesh (N/A, 11/23/2018); Esophagogastroduodenoscopy (egd) with propofol (N/A, 05/09/2021); and Esophagogastroduodenoscopy (egd) with propofol (N/A, 03/09/2022). Family: family history includes Alcohol abuse in her maternal grandmother and sister; Bipolar disorder in her sister; Breast cancer (age of onset: 37) in her mother; Cancer in her mother; Drug abuse in her sister; Lung cancer in her father; Stroke in her maternal grandmother and paternal grandmother.  Laboratory Chemistry Profile   Renal Lab  Results  Component Value Date   BUN 16 11/23/2021   CREATININE 0.92 11/23/2021   BCR 23 02/09/2019   GFR 69.29 11/23/2021   GFRAA 82 02/09/2019   GFRNONAA >60 09/21/2021    Hepatic Lab Results  Component Value Date   AST 13 11/23/2021   ALT 10 11/23/2021   ALBUMIN 3.7 11/23/2021   ALKPHOS 68 11/23/2021   LIPASE 40 01/19/2018    Electrolytes Lab Results  Component Value Date   NA 143 11/23/2021  K 3.8 11/23/2021   CL 110 11/23/2021   CALCIUM 8.6 11/23/2021   MG 1.8 01/26/2018   PHOS 4.8 (H) 01/26/2018    Bone Lab Results  Component Value Date   VD25OH 38.39 11/23/2021    Inflammation (CRP: Acute Phase) (ESR: Chronic Phase) No results found for: "CRP", "ESRSEDRATE", "LATICACIDVEN"       Note: Above Lab results reviewed.  Recent Imaging Review  CT Chest W Contrast CLINICAL DATA:  Lung nodule abnormal chest x-ray  EXAM: CT CHEST WITH CONTRAST  TECHNIQUE: Multidetector CT imaging of the chest was performed during intravenous contrast administration.  RADIATION DOSE REDUCTION: This exam was performed according to the departmental dose-optimization program which includes automated exposure control, adjustment of the mA and/or kV according to patient size and/or use of iterative reconstruction technique.  CONTRAST:  30m OMNIPAQUE IOHEXOL 300 MG/ML  SOLN  COMPARISON:  Chest x-ray 09/17/2021, CT chest 04/02/2020, CT abdomen pelvis 09/21/2021  FINDINGS: Cardiovascular: Aorta is nonaneurysmal. Mild atherosclerosis. Mild coronary vascular calcification. Normal cardiac size. Trace pericardial effusion  Mediastinum/Nodes: Midline trachea. No thyroid mass. No suspicious lymph nodes. Esophagus within normal limits.  Lungs/Pleura: No acute airspace disease, pleural effusion or pneumothorax. The previously noted right middle lobe nodularity has largely resolved. There is minimal hazy and streaky density in the region, felt consistent with post infectious or post  inflammatory scarring. Small amount of subpleural reticulation in the right upper lobe as well.  Upper Abdomen: Fluid distension of the stomach with gastro jejunostomy. Thickened appearance of the pylorus. Partially visualized fluid-filled bowel in the left upper quadrant. Post cholecystectomy. Cyst in the right hepatic lobe.  Musculoskeletal: No acute osseous abnormality  IMPRESSION: 1. Previously noted right middle lobe nodularity has largely resolved. There is minimal hazy and streaky density in the region felt consistent with post infectious or post inflammatory scarring. 2. Postoperative appearance of the stomach. There is moderate fluid distension of stomach. There is thickened appearance of the pylorus which could be due to spasm versus peptic ulcer disease.  Aortic Atherosclerosis (ICD10-I70.0).  Electronically Signed   By: KDonavan FoilM.D.   On: 10/06/2021 22:59 Note: Reviewed        Physical Exam  General appearance: Well nourished, well developed, and well hydrated. In no apparent acute distress Mental status: Alert, oriented x 3 (person, place, & time)       Respiratory: No evidence of acute respiratory distress Eyes: PERLA Vitals: BP 118/69 (BP Location: Right Arm, Patient Position: Sitting, Cuff Size: Normal)   Pulse 71   Temp (!) 97.2 F (36.2 C) (Temporal)   Resp 16   Ht '4\' 11"'$  (1.499 m)   Wt 96 lb (43.5 kg)   SpO2 100%   BMI 19.39 kg/m  BMI: Estimated body mass index is 19.39 kg/m as calculated from the following:   Height as of this encounter: '4\' 11"'$  (1.499 m).   Weight as of this encounter: 96 lb (43.5 kg). Ideal: Female patients must weigh at least 45.5 kg to calculate ideal body weight  Thoracic Spine Area Exam  Skin & Axial Inspection: No masses, redness, or swelling Alignment: Symmetrical Functional ROM: Unrestricted ROM Stability: No instability detected Muscle Tone/Strength: Functionally intact. No obvious neuro-muscular anomalies  detected. Sensory (Neurological): Unimpaired   Lumbar Exam  Skin & Axial Inspection: No masses, redness, or swelling Alignment: Symmetrical Functional ROM: Unrestricted ROM       Stability: No instability detected Muscle Tone/Strength: Functionally intact. No obvious neuro-muscular anomalies detected. Sensory (Neurological): Unimpaired  Gait & Posture Assessment  Ambulation: Unassisted Gait: Relatively normal for age and body habitus Posture: WNL    Lower Extremity Exam      Side: Right lower extremity   Side: Left lower extremity  Stability: No instability observed           Stability: No instability observed          Skin & Extremity Inspection: Skin color, temperature, and hair growth are WNL. No peripheral edema or cyanosis. No masses, redness, swelling, asymmetry, or associated skin lesions. No contractures.   Skin & Extremity Inspection: Skin color, temperature, and hair growth are WNL. No peripheral edema or cyanosis. No masses, redness, swelling, asymmetry, or associated skin lesions. No contractures.  Functional ROM: Pain restricted ROM ankle/foot           Functional ROM: Pain restricted ROM ankle/foot          Muscle Tone/Strength: Functionally intact. No obvious neuro-muscular anomalies detected.   Muscle Tone/Strength: Functionally intact. No obvious neuro-muscular anomalies detected.  Sensory (Neurological): Neuropathic pain pattern         Sensory (Neurological): Neuropathic pain pattern        DTR: Patellar: deferred today Achilles: deferred today Plantar: deferred today   DTR: Patellar: deferred today Achilles: deferred today Plantar: deferred today  Palpation: No palpable anomalies   Palpation: No palpable anomalies      Assessment   Diagnosis Status  1. Plantar fascial fibromatosis of both feet   2. Idiopathic peripheral neuropathy   3. Chronic pain syndrome   4. PTSD (post-traumatic stress disorder)    Controlled Controlled Controlled   Updated  Problems: No problems updated.    Plan of Care  Continue tramadol as prescribed Follow-up for bilateral cortisone injections in bilateral feet for planter fasciitis.  Requested Prescriptions   Signed Prescriptions Disp Refills   traMADol (ULTRAM) 50 MG tablet 60 tablet 2    Sig: Take 1 tablet (50 mg total) by mouth every 12 (twelve) hours as needed.      Orders:  No orders of the defined types were placed in this encounter.  Follow-up plan:   Return in about 3 months (around 06/15/2022) for Medication Management, in person.    Recent Visits No visits were found meeting these conditions. Showing recent visits within past 90 days and meeting all other requirements Today's Visits Date Type Provider Dept  03/16/22 Office Visit Gillis Santa, MD [facility]  Showing today's visits and meeting all other requirements Future Appointments No visits were found meeting these conditions. Showing future appointments within next 90 days and meeting all other requirements  I discussed the assessment and treatment plan with the patient. The patient was provided an opportunity to ask questions and all were answered. The patient agreed with the plan and demonstrated an understanding of the instructions.  Patient advised to call back or seek an in-person evaluation if the symptoms or condition worsens.  Duration of encounter: 35mnutes.  Total time on encounter, as per AMA guidelines included both the face-to-face and non-face-to-face time personally spent by the physician and/or other qualified health care professional(s) on the day of the encounter (includes time in activities that require the physician or other qualified health care professional and does not include time in activities normally performed by clinical staff). Physician's time may include the following activities when performed: preparing to see the patient (eg, review of tests, pre-charting review of records) obtaining  and/or reviewing separately obtained history performing a medically  appropriate examination and/or evaluation counseling and educating the patient/family/caregiver ordering medications, tests, or procedures referring and communicating with other health care professionals (when not separately reported) documenting clinical information in the electronic or other health record independently interpreting results (not separately reported) and communicating results to the patient/ family/caregiver care coordination (not separately reported)  Note by: Gillis Santa, MD Date: 03/16/2022; Time: 10:42 AM

## 2022-03-17 ENCOUNTER — Ambulatory Visit
Admission: RE | Admit: 2022-03-17 | Discharge: 2022-03-17 | Disposition: A | Discharge status: Home / Self Care | Payer: Medicare HMO | Source: Ambulatory Visit | Attending: Gastroenterology | Admitting: Gastroenterology

## 2022-03-17 DIAGNOSIS — R1310 Dysphagia, unspecified: Secondary | ICD-10-CM

## 2022-03-17 DIAGNOSIS — K449 Diaphragmatic hernia without obstruction or gangrene: Secondary | ICD-10-CM | POA: Diagnosis not present

## 2022-03-18 ENCOUNTER — Encounter: Payer: Self-pay | Admitting: Psychiatry

## 2022-03-18 ENCOUNTER — Ambulatory Visit (INDEPENDENT_AMBULATORY_CARE_PROVIDER_SITE_OTHER): Payer: No Typology Code available for payment source | Admitting: Psychiatry

## 2022-03-18 VITALS — BP 110/68 | HR 99 | Ht 59.0 in | Wt 98.4 lb

## 2022-03-18 DIAGNOSIS — F4310 Post-traumatic stress disorder, unspecified: Secondary | ICD-10-CM

## 2022-03-18 DIAGNOSIS — R69 Illness, unspecified: Secondary | ICD-10-CM | POA: Diagnosis not present

## 2022-03-18 DIAGNOSIS — F0630 Mood disorder due to known physiological condition, unspecified: Secondary | ICD-10-CM

## 2022-03-18 MED ORDER — ARIPIPRAZOLE 2 MG PO TABS: 2.0000 mg | ORAL_TABLET | Freq: Every day | ORAL | 0 refills | Status: DC

## 2022-03-18 NOTE — Patient Instructions (Signed)
Continue duloxetine 90 mg daily,  Continue bupropion 100 mg twice a day  Continue Abilify 2 mg at night  Obtain EKG  Start Abilify 2 mg daily  Next appointment: 3/19 at 8 am

## 2022-03-25 ENCOUNTER — Telehealth: Payer: Self-pay | Admitting: Psychiatry

## 2022-03-25 ENCOUNTER — Ambulatory Visit
Admission: RE | Admit: 2022-03-25 | Discharge: 2022-03-25 | Disposition: A | Discharge status: Home / Self Care | Payer: Medicare HMO | Source: Ambulatory Visit | Attending: Psychiatry | Admitting: Psychiatry

## 2022-03-25 ENCOUNTER — Other Ambulatory Visit: Payer: Self-pay | Admitting: Primary Care

## 2022-03-25 DIAGNOSIS — F39 Unspecified mood [affective] disorder: Secondary | ICD-10-CM | POA: Diagnosis not present

## 2022-03-25 DIAGNOSIS — M79671 Pain in right foot: Secondary | ICD-10-CM

## 2022-03-25 DIAGNOSIS — R69 Illness, unspecified: Secondary | ICD-10-CM | POA: Diagnosis not present

## 2022-03-25 NOTE — Telephone Encounter (Signed)
EKG reviewed. NSR, HR 85, QTc 430 msec. 03/2022  Could you call her and inform that EKG looks good without significant abnormality. I would recommend she continues medication as it is.

## 2022-03-26 NOTE — Telephone Encounter (Signed)
Pt notified 

## 2022-04-08 ENCOUNTER — Telehealth: Payer: Self-pay | Admitting: Psychiatry

## 2022-04-08 NOTE — Telephone Encounter (Signed)
Could you ask her to try the GoodRx coupon? She can transfer the order to the pharmacy of her choice. Let me know if this does not work. Thanks.

## 2022-04-08 NOTE — Telephone Encounter (Signed)
Patient went to pick up medication, Abilify, and cost $172 with insurance even. States that she cannot afford that medication. And she was doing so well on it.  Please advise

## 2022-04-12 ENCOUNTER — Other Ambulatory Visit: Payer: Self-pay | Admitting: Gastroenterology

## 2022-04-12 ENCOUNTER — Telehealth: Payer: Self-pay

## 2022-04-12 NOTE — Telephone Encounter (Signed)
Medication management - Telephone call back to patient to inform of the website www.goodrx.com and how to use this site to get medications at their cheapest local cost. Patient stated understanding and will look into using this for Ablify 2 mg. Patient to call back if any difficulties using the website and changing her prescription to a pharmacy she can get the medication filled cheaper.

## 2022-04-12 NOTE — Telephone Encounter (Signed)
Could you inquire whether she is willing to consider using the GoodRx app? I believe it might offer a more cost-effective option. Please let me know if she still prefers to explore alternative options.

## 2022-04-12 NOTE — Telephone Encounter (Signed)
Medication problem - Call with patient, after she left a voice message she is not able to fill her prescribed Abilify as with insurance this will still cost her $172. Pt requests to speak to Dr. Modesta Messing about trying something else less costly and agreed to her the message.

## 2022-04-15 NOTE — Telephone Encounter (Signed)
Called pharmacy and patient picked up rx yesterday and paid $21.43.

## 2022-04-30 ENCOUNTER — Other Ambulatory Visit: Payer: Self-pay | Admitting: Primary Care

## 2022-04-30 DIAGNOSIS — G43111 Migraine with aura, intractable, with status migrainosus: Secondary | ICD-10-CM
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
## 2022-05-25 ENCOUNTER — Ambulatory Visit: Payer: No Typology Code available for payment source | Admitting: Psychiatry

## 2022-05-29 DIAGNOSIS — R32 Unspecified urinary incontinence: Secondary | ICD-10-CM | POA: Diagnosis not present

## 2022-05-29 DIAGNOSIS — F319 Bipolar disorder, unspecified: Secondary | ICD-10-CM | POA: Diagnosis not present

## 2022-05-29 DIAGNOSIS — R69 Illness, unspecified: Secondary | ICD-10-CM | POA: Diagnosis not present

## 2022-05-29 DIAGNOSIS — G43909 Migraine, unspecified, not intractable, without status migrainosus: Secondary | ICD-10-CM | POA: Diagnosis not present

## 2022-05-29 DIAGNOSIS — G4700 Insomnia, unspecified: Secondary | ICD-10-CM | POA: Diagnosis not present

## 2022-05-29 DIAGNOSIS — G2581 Restless legs syndrome: Secondary | ICD-10-CM | POA: Diagnosis not present

## 2022-05-29 DIAGNOSIS — Z809 Family history of malignant neoplasm, unspecified: Secondary | ICD-10-CM | POA: Diagnosis not present

## 2022-05-29 DIAGNOSIS — Z8249 Family history of ischemic heart disease and other diseases of the circulatory system: Secondary | ICD-10-CM | POA: Diagnosis not present

## 2022-05-29 DIAGNOSIS — E669 Obesity, unspecified: Secondary | ICD-10-CM | POA: Diagnosis not present

## 2022-05-29 DIAGNOSIS — F419 Anxiety disorder, unspecified: Secondary | ICD-10-CM | POA: Diagnosis not present

## 2022-05-29 DIAGNOSIS — M1990 Unspecified osteoarthritis, unspecified site: Secondary | ICD-10-CM | POA: Diagnosis not present

## 2022-05-29 DIAGNOSIS — Z008 Encounter for other general examination: Secondary | ICD-10-CM | POA: Diagnosis not present

## 2022-05-29 DIAGNOSIS — G629 Polyneuropathy, unspecified: Secondary | ICD-10-CM | POA: Diagnosis not present

## 2022-05-29 DIAGNOSIS — K219 Gastro-esophageal reflux disease without esophagitis: Secondary | ICD-10-CM | POA: Diagnosis not present

## 2022-06-01 ENCOUNTER — Ambulatory Visit (INDEPENDENT_AMBULATORY_CARE_PROVIDER_SITE_OTHER): Payer: Medicare HMO | Admitting: Psychiatry

## 2022-06-01 ENCOUNTER — Encounter: Payer: Self-pay | Admitting: Psychiatry

## 2022-06-01 VITALS — BP 111/71 | HR 91 | Temp 97.8°F | Ht 59.0 in | Wt 103.6 lb

## 2022-06-01 DIAGNOSIS — F4310 Post-traumatic stress disorder, unspecified: Secondary | ICD-10-CM

## 2022-06-01 DIAGNOSIS — F0630 Mood disorder due to known physiological condition, unspecified: Secondary | ICD-10-CM | POA: Diagnosis not present

## 2022-06-01 DIAGNOSIS — R69 Illness, unspecified: Secondary | ICD-10-CM | POA: Diagnosis not present

## 2022-06-01 MED ORDER — ARIPIPRAZOLE 2 MG PO TABS: 2.0000 mg | ORAL_TABLET | Freq: Every day | ORAL | 0 refills | Status: DC
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
## 2022-06-08 ENCOUNTER — Other Ambulatory Visit: Payer: Self-pay | Admitting: Psychiatry

## 2022-06-09 ENCOUNTER — Encounter: Payer: Self-pay | Admitting: Student in an Organized Health Care Education/Training Program

## 2022-06-09 ENCOUNTER — Ambulatory Visit
Payer: Medicare HMO | Attending: Student in an Organized Health Care Education/Training Program | Admitting: Student in an Organized Health Care Education/Training Program

## 2022-06-09 VITALS — BP 103/65 | HR 77 | Temp 97.7°F | Resp 16 | Ht 59.0 in | Wt 100.0 lb

## 2022-06-09 DIAGNOSIS — M722 Plantar fascial fibromatosis: Secondary | ICD-10-CM | POA: Insufficient documentation

## 2022-06-09 DIAGNOSIS — Z0289 Encounter for other administrative examinations: Secondary | ICD-10-CM | POA: Diagnosis not present

## 2022-06-09 DIAGNOSIS — G894 Chronic pain syndrome: Secondary | ICD-10-CM | POA: Insufficient documentation

## 2022-06-09 DIAGNOSIS — G609 Hereditary and idiopathic neuropathy, unspecified: Secondary | ICD-10-CM | POA: Insufficient documentation

## 2022-06-09 MED ORDER — TRAMADOL HCL 50 MG PO TABS: 50.0000 mg | ORAL_TABLET | Freq: Two times a day (BID) | ORAL | 1 refills | Status: DC | PRN

## 2022-06-09 NOTE — Progress Notes (Signed)
PROVIDER NOTE: Information contained herein reflects review and annotations entered in association with encounter. Interpretation of such information and data should be left to medically-trained personnel. Information provided to patient can be located elsewhere in the medical record under "Patient Instructions". Document created using STT-dictation technology, any transcriptional errors that may result from process are unintentional.    Patient: Tanya Nguyen  Service Category: E/M  Provider: Gillis Santa, MD  DOB: 1965-12-05  DOS: 06/09/2022  Referring Provider: Pleas Koch, NP  MRN: [id number]  Specialty: Interventional Pain Management  PCP: Pleas Koch, NP  Type: Established Patient  Setting: Ambulatory outpatient    Location: Office  Delivery: Face-to-face     HPI  Tanya Nguyen, a 57 y.o. year old female, is here today because of her Idiopathic peripheral neuropathy [G60.9]. Tanya Nguyen primary complain today is Foot Pain (bilateral) Last encounter: My last encounter with her was on 03/16/22 Pertinent problems: Tanya Nguyen has Migraines; PTSD (post-traumatic stress disorder); Plantar fascial fibromatosis of both feet; Sacroiliac joint pain; Idiopathic peripheral neuropathy; Chronic pain syndrome; and Pain management contract signed on their pertinent problem list. Pain Assessment: Severity of Chronic pain is reported as a 4 /10. Location: Foot Right, Left/up back of legs below knee. Onset: More than a month ago. Quality: Aching, Burning, Constant, Discomfort, Stabbing. Timing: Constant. Modifying factor(s): rest, medications. Vitals:  height is 4\' 11"  (1.499 m) and weight is 100 lb (45.4 kg). Her temperature is 97.7 F (36.5 C). Her blood pressure is 103/65 and her pulse is 77. Her respiration is 16 and oxygen saturation is 97%.   Reason for encounter:  No change in medical history since last visit.  Patient's pain is at baseline.  Patient continues multimodal pain regimen as  prescribed.  States that it provides pain relief and improvement in functional status.   09/17/2021: She states that her current dose of tramadol providing effective analgesic and functional benefit.  States that Dr. Amalia Hailey her podiatrist prefers that tramadol be managed here.  We will increase dose to 50 mg twice daily for the purpose of obtaining better pain relief.  She will also sign a pain contract today.   03/13/2020: HPI from initial clinic visit below: Tanya Nguyen is a pleasant 57 year old female who presents with a chief complaint of bilateral foot pain related to plantar fasciitis and idiopathic peripheral neuropathy.  She describes her pain as burning and tingling has been chronic in nature.  She has been followed by podiatry in the past and has received steroid injections in both of her feet, bilateral heels.  She has been referred by Dr. Amalia Hailey for medication management.  Patient is on tramadol and takes it 50 mg once or twice daily as needed.  She is also on Lyrica 75 mg twice a day and 225 mg nightly.  This is being managed by her primary care provider.  She is also on Cymbalta 30 mg as well as Zoloft 50 mg.  No symptoms concerning for serotonin syndrome such as agitation, nausea, diaphoresis, tachycardia, palpitations..  She would like to continue her tramadol and is      Pharmacotherapy Assessment  Analgesic: Tramadol 50 mg twice daily as needed  Monitoring: [place] PMP: PDMP reviewed during this encounter.       Pharmacotherapy: No side-effects or adverse reactions reported. Compliance: No problems identified. Effectiveness: Clinically acceptable.  Tanya Specking, RN  06/09/2022  1:45 PM  Sign when Signing Visit Nursing Pain Medication Assessment:  Safety precautions to be maintained  throughout the outpatient stay will include: orient to surroundings, keep bed in low position, maintain call bell within reach at all times, provide assistance with transfer out of bed and ambulation.   Medication Inspection Compliance: Pill count conducted under aseptic conditions, in front of the patient. Neither the pills nor the bottle was removed from the patient's sight at any time. Once count was completed pills were immediately returned to the patient in their original bottle.  Medication: See above Pill/Patch Count:  18 of 60 pills remain Pill/Patch Appearance: Markings consistent with prescribed medication Bottle Appearance: Standard pharmacy container. Clearly labeled. Filled Date: 01 / 09 / 2024 Last Medication intake:  Today    No results found for: "CBDTHCR" No results found for: "D8THCCBX" No results found for: "D9THCCBX"  UDS:  Summary  Date Value Ref Range Status  03/13/2020 Note  Final    Comment:    ==================================================================== Compliance Drug Analysis, Ur ==================================================================== Test                             Result       Flag       Units  Drug Present and Declared for Prescription Verification   Tramadol                       >11905       EXPECTED   ng/mg creat   O-Desmethyltramadol            9229         EXPECTED   ng/mg creat   N-Desmethyltramadol            3952         EXPECTED   ng/mg creat    Source of tramadol is a prescription medication. O-desmethyltramadol    and N-desmethyltramadol are expected metabolites of tramadol.    Pregabalin                     PRESENT      EXPECTED   Topiramate                     PRESENT      EXPECTED   Cyclobenzaprine                PRESENT      EXPECTED   Desmethylcyclobenzaprine       PRESENT      EXPECTED    Desmethylcyclobenzaprine is an expected metabolite of    cyclobenzaprine.    Duloxetine                     PRESENT      EXPECTED   Trazodone                      PRESENT      EXPECTED   1,3 chlorophenyl piperazine    PRESENT      EXPECTED    1,3-chlorophenyl piperazine is an expected metabolite of trazodone.  Drug  Present not Declared for Prescription Verification   Salicylate                     PRESENT      UNEXPECTED  Drug Absent but Declared for Prescription Verification   Sertraline  Not Detected UNEXPECTED ==================================================================== Test                      Result    Flag   Units      Ref Range   Creatinine              42               mg/dL      >=20 ==================================================================== Declared Medications:  The flagging and interpretation on this report are based on the  following declared medications.  Unexpected results may arise from  inaccuracies in the declared medications.   **Note: The testing scope of this panel includes these medications:   Cyclobenzaprine  Duloxetine  Pregabalin  Sertraline  Topiramate  Tramadol  Trazodone   **Note: The testing scope of this panel does not include the  following reported medications:   Cetirizine  Fluticasone  Levothyroxine  Pantoprazole  Sumatriptan ==================================================================== For clinical consultation, please call [phone]. ====================================================================       ROS  Constitutional: Denies any fever or chills Gastrointestinal: No reported hemesis, hematochezia, vomiting, or acute GI distress Musculoskeletal:  bilateral foot pain Neurological: No reported episodes of acute onset apraxia, aphasia, dysarthria, agnosia, amnesia, paralysis, loss of coordination, or loss of consciousness  Medication Review  ARIPiprazole, DULoxetine, SUMAtriptan, buPROPion ER, ferrous sulfate, levothyroxine, oxybutynin, pantoprazole, topiramate, traMADol, and traZODone  History Review  Allergy: Tanya Nguyen is allergic to penicillins, nsaids, gabapentin, pregabalin, and sulfa antibiotics. Drug: Tanya Nguyen  reports no history of drug use. Alcohol:  reports that she does not  currently use alcohol. Tobacco:  reports that she has never smoked. She has never used smokeless tobacco. Social: Tanya Nguyen  reports that she has never smoked. She has never used smokeless tobacco. She reports that she does not currently use alcohol. She reports that she does not use drugs. Medical:  has a past medical history of Acquired pyloric stricture, Bleeding duodenal ulcer, CAP (community acquired pneumonia) (02/12/2020), Chronic esophagogastric ulcer, Depression, Duodenal obstruction, Gastric outlet obstruction (01/19/2018), GERD (gastroesophageal reflux disease), Hair loss (09/22/2018), History of stomach ulcers, Hypothyroidism, Incisional hernia, without obstruction or gangrene, Intractable vomiting, Kidney stones, Melena (05/08/2021), Migraine, Migraine headache, Neuropathy, Side pain (03/30/2018), and Upper GI bleed (05/18/2021). Surgical: Tanya Nguyen  has a past surgical history that includes Abdominal hysterectomy (2004); Cholecystectomy; Tooth extraction; Esophagogastroduodenoscopy (egd) with propofol (N/A, 01/12/2018); laparotomy (N/A, 01/20/2018); Esophagogastroduodenoscopy (egd) with propofol (N/A, 01/25/2018); Colonoscopy with propofol (N/A, 02/27/2018); Esophagogastroduodenoscopy (egd) with propofol (N/A, 02/27/2018); Incisional hernia repair (N/A, 11/23/2018); Insertion of mesh (N/A, 11/23/2018); Esophagogastroduodenoscopy (egd) with propofol (N/A, 05/09/2021); and Esophagogastroduodenoscopy (egd) with propofol (N/A, 03/09/2022). Family: family history includes Alcohol abuse in her maternal grandmother and sister; Bipolar disorder in her sister; Breast cancer (age of onset: 56) in her mother; Cancer in her mother; Drug abuse in her sister; Lung cancer in her father; Stroke in her maternal grandmother and paternal grandmother.  Laboratory Chemistry Profile   Renal Lab Results  Component Value Date   BUN 16 11/23/2021   CREATININE 0.92 11/23/2021   BCR 23 02/09/2019   GFR 69.29 11/23/2021    GFRAA 82 02/09/2019   GFRNONAA >60 09/21/2021    Hepatic Lab Results  Component Value Date   AST 13 11/23/2021   ALT 10 11/23/2021   ALBUMIN 3.7 11/23/2021   ALKPHOS 68 11/23/2021   LIPASE 40 01/19/2018    Electrolytes Lab Results  Component Value Date   NA 143 11/23/2021  K 3.8 11/23/2021   CL 110 11/23/2021   CALCIUM 8.6 11/23/2021   MG 1.8 01/26/2018   PHOS 4.8 (H) 01/26/2018    Bone Lab Results  Component Value Date   VD25OH 38.39 11/23/2021    Inflammation (CRP: Acute Phase) (ESR: Chronic Phase) No results found for: "CRP", "ESRSEDRATE", "LATICACIDVEN"       Note: Above Lab results reviewed.  Recent Imaging Review  DG UGI W SMALL BOWEL SINGLE CM CLINICAL DATA:  Provided history: Dysphagia, unspecified type. Abdominal pain and nausea. Additional history provided: The patient reports dysphagia, abdominal pain, occasional nausea/vomiting, difficulty swallowing pills. Additional history obtained from [employer] gastrojejunostomy and jejunojejunostomy.  EXAM: DG UGI W/ SMALL BOWEL  TECHNIQUE: A scout radiograph was obtained. Subsequently, a combined double and single contrast examination was performed using effervescent crystals, high-density barium and thin liquid barium. The exam was performed by Brynda Greathouse PA-C, and was supervised and interpreted by Dr. Kellie Simmering.  FLUOROSCOPY TIME:  Radiation Exposure Index (as provided by the fluoroscopic device): 81.5 mGy  COMPARISON:  Chest CT 10/06/2021.  CT abdomen/pelvis 09/21/2021.  FINDINGS: A scout radiograph of the abdomen was obtained prior to the upper GI series. Nonobstructive bowel gas pattern. Small colonic stool burden. Surgical clips within the right upper quadrant of the abdomen. Sutures within the left upper quadrant of the abdomen. Partially imaged dextrocurvature of the thoracolumbar spine.  Fluoroscopic evaluation demonstrates normal caliber and smooth contour of the  esophagus. No evidence of fixed stricture, mass or mucosal abnormality.  Normal esophageal motility was observed.  Small sliding hiatal hernia.  No gastroesophageal reflux was observed.  The patient swallowed a 13 mm barium tablet, which freely passed into the stomach.  The patient has a history of gastrojejunostomy and jejunojejunostomy.  Thickened appearance of the gastric folds, suggesting gastritis.  There was dilation of proximal jejunal loops (measuring up to 7 cm in diameter). However, there was no significant delay in contrast transit to suggest a small bowel obstruction.  The terminal ileum was somewhat poorly visualized due to overlapping bowel loops (despite paddle compression). Within this limitation, there was no evidence of a stricture or mass more distally within the small bowel.  IMPRESSION: 1. Small sliding hiatal hernia. 2. Prior gastrojejunostomy and jejunojejunostomy. 3. Thickened appearance of the gastric folds, suggesting gastritis. Endoscopy should be considered for further evaluation. 4. Dilation of proximal jejunal loops (measuring up to 7 cm in diameter). However, no significant delay in contrast transit to suggest a small bowel obstruction. 5. The terminal ileum was somewhat poorly visualized due to overlapping bowel loops (despite paddle compression). Within this limitation, otherwise unremarkable appearance of the small bowel.  Electronically Signed   By: Kellie Simmering D.O.   On: 03/17/2022 13:19 Note: Reviewed        Physical Exam  General appearance: Well nourished, well developed, and well hydrated. In no apparent acute distress Mental status: Alert, oriented x 3 (person, place, & time)       Respiratory: No evidence of acute respiratory distress Eyes: PERLA Vitals: BP 103/65   Pulse 77   Temp 97.7 F (36.5 C)   Resp 16   Ht 4\' 11"  (1.499 m)   Wt 100 lb (45.4 kg)   SpO2 97%   BMI 20.20 kg/m  BMI: Estimated body mass index is  20.2 kg/m as calculated from the following:   Height as of this encounter: 4\' 11"  (1.499 m).   Weight as of this encounter: 100 lb (  45.4 kg). Ideal: Female patients must weigh at least 45.5 kg to calculate ideal body weight  Thoracic Spine Area Exam  Skin & Axial Inspection: No masses, redness, or swelling Alignment: Symmetrical Functional ROM: Unrestricted ROM Stability: No instability detected Muscle Tone/Strength: Functionally intact. No obvious neuro-muscular anomalies detected. Sensory (Neurological): Unimpaired   Lumbar Exam  Skin & Axial Inspection: No masses, redness, or swelling Alignment: Symmetrical Functional ROM: Unrestricted ROM       Stability: No instability detected Muscle Tone/Strength: Functionally intact. No obvious neuro-muscular anomalies detected. Sensory (Neurological): Unimpaired    Gait & Posture Assessment  Ambulation: Unassisted Gait: Relatively normal for age and body habitus Posture: WNL    Lower Extremity Exam      Side: Right lower extremity   Side: Left lower extremity  Stability: No instability observed           Stability: No instability observed          Skin & Extremity Inspection: Skin color, temperature, and hair growth are WNL. No peripheral edema or cyanosis. No masses, redness, swelling, asymmetry, or associated skin lesions. No contractures.   Skin & Extremity Inspection: Skin color, temperature, and hair growth are WNL. No peripheral edema or cyanosis. No masses, redness, swelling, asymmetry, or associated skin lesions. No contractures.  Functional ROM: Pain restricted ROM ankle/foot           Functional ROM: Pain restricted ROM ankle/foot          Muscle Tone/Strength: Functionally intact. No obvious neuro-muscular anomalies detected.   Muscle Tone/Strength: Functionally intact. No obvious neuro-muscular anomalies detected.  Sensory (Neurological): Neuropathic pain pattern         Sensory (Neurological): Neuropathic pain pattern         DTR: Patellar: deferred today Achilles: deferred today Plantar: deferred today   DTR: Patellar: deferred today Achilles: deferred today Plantar: deferred today  Palpation: No palpable anomalies   Palpation: No palpable anomalies      Assessment   Diagnosis Status  1. Idiopathic peripheral neuropathy   2. Chronic pain syndrome   3. Plantar fascial fibromatosis of both feet   4. Pain management contract signed     Controlled Controlled Controlled    Plan of Care  Continue tramadol as prescribed Continue with psychiatric care  Requested Prescriptions   Signed Prescriptions Disp Refills   traMADol (ULTRAM) 50 MG tablet 60 tablet 1    Sig: Take 1 tablet (50 mg total) by mouth every 12 (twelve) hours as needed.      Orders:  No orders of the defined types were placed in this encounter.  Follow-up plan:   Return for patient will call to schedule F2F appt prn.    Recent Visits Date Type Provider Dept  03/16/22 Office Visit Gillis Santa, MD [facility]  Showing recent visits within past 90 days and meeting all other requirements Today's Visits Date Type Provider Dept  06/09/22 Office Visit Gillis Santa, MD [facility]  Showing today's visits and meeting all other requirements Future Appointments No visits were found meeting these conditions. Showing future appointments within next 90 days and meeting all other requirements  I discussed the assessment and treatment plan with the patient. The patient was provided an opportunity to ask questions and all were answered. The patient agreed with the plan and demonstrated an understanding of the instructions.  Patient advised to call back or seek an in-person evaluation if the symptoms or condition worsens.  Duration of encounter: 59minutes.  Total time on encounter, as per AMA guidelines included both the face-to-face and non-face-to-face time personally spent by the physician and/or other qualified  health care professional(s) on the day of the encounter (includes time in activities that require the physician or other qualified health care professional and does not include time in activities normally performed by clinical staff). Physician's time may include the following activities when performed: preparing to see the patient (eg, review of tests, pre-charting review of records) obtaining and/or reviewing separately obtained history performing a medically appropriate examination and/or evaluation counseling and educating the patient/family/caregiver ordering medications, tests, or procedures referring and communicating with other health care professionals (when not separately reported) documenting clinical information in the electronic or other health record independently interpreting results (not separately reported) and communicating results to the patient/ family/caregiver care coordination (not separately reported)  Note by: Gillis Santa, MD Date: 06/09/2022; Time: 2:24 PM

## 2022-06-09 NOTE — Progress Notes (Signed)
Nursing Pain Medication Assessment:  Safety precautions to be maintained throughout the outpatient stay will include: orient to surroundings, keep bed in low position, maintain call bell within reach at all times, provide assistance with transfer out of bed and ambulation.  Medication Inspection Compliance: Pill count conducted under aseptic conditions, in front of the patient. Neither the pills nor the bottle was removed from the patient's sight at any time. Once count was completed pills were immediately returned to the patient in their original bottle.  Medication: See above Pill/Patch Count:  18 of 60 pills remain Pill/Patch Appearance: Markings consistent with prescribed medication Bottle Appearance: Standard pharmacy container. Clearly labeled. Filled Date: 01 / 09 / 2024 Last Medication intake:  Today

## 2022-06-10 ENCOUNTER — Encounter: Payer: Medicare HMO | Admitting: Student in an Organized Health Care Education/Training Program

## 2022-06-23 ENCOUNTER — Encounter: Payer: Self-pay | Admitting: Primary Care

## 2022-06-28 ENCOUNTER — Other Ambulatory Visit: Payer: Self-pay | Admitting: Primary Care

## 2022-06-28 DIAGNOSIS — Z1231 Encounter for screening mammogram for malignant neoplasm of breast: Secondary | ICD-10-CM

## 2022-07-08 ENCOUNTER — Ambulatory Visit: Payer: Medicare HMO | Admitting: Student in an Organized Health Care Education/Training Program

## 2022-07-12 ENCOUNTER — Ambulatory Visit: Payer: No Typology Code available for payment source | Admitting: Psychiatry

## 2022-07-13 ENCOUNTER — Other Ambulatory Visit: Payer: Self-pay | Admitting: Primary Care

## 2022-07-13 DIAGNOSIS — G43111 Migraine with aura, intractable, with status migrainosus: Secondary | ICD-10-CM

## 2022-07-22 ENCOUNTER — Encounter: Payer: Self-pay | Admitting: Student in an Organized Health Care Education/Training Program

## 2022-07-22 ENCOUNTER — Ambulatory Visit
Payer: Medicare HMO | Attending: Student in an Organized Health Care Education/Training Program | Admitting: Student in an Organized Health Care Education/Training Program

## 2022-07-22 VITALS — BP 119/69 | HR 74 | Temp 97.3°F | Resp 16 | Ht 59.0 in | Wt 103.0 lb

## 2022-07-22 DIAGNOSIS — G894 Chronic pain syndrome: Secondary | ICD-10-CM | POA: Diagnosis not present

## 2022-07-22 DIAGNOSIS — G609 Hereditary and idiopathic neuropathy, unspecified: Secondary | ICD-10-CM | POA: Diagnosis not present

## 2022-07-22 DIAGNOSIS — M722 Plantar fascial fibromatosis: Secondary | ICD-10-CM | POA: Diagnosis not present

## 2022-07-22 NOTE — Progress Notes (Signed)
Safety precautions to be maintained throughout the outpatient stay will include: orient to surroundings, keep bed in low position, maintain call bell within reach at all times, provide assistance with transfer out of bed and ambulation.  

## 2022-07-22 NOTE — Progress Notes (Signed)
PROVIDER NOTE: Information contained herein reflects review and annotations entered in association with encounter. Interpretation of such information and data should be left to medically-trained personnel. Information provided to patient can be located elsewhere in the medical record under "Patient Instructions". Document created using STT-dictation technology, any transcriptional errors that may result from process are unintentional.    Patient: Tanya Nguyen  Service Category: E/M  Provider: Edward Jolly, MD  DOB: 01-03-1966  DOS: 07/22/2022  Referring Provider: Doreene Nest, NP  MRN: [id number]  Specialty: Interventional Pain Management  PCP: Doreene Nest, NP  Type: Established Patient  Setting: Ambulatory outpatient    Location: Office  Delivery: Face-to-face     HPI  Tanya Nguyen, a 57 y.o. year old female, is here today because of her Plantar fascial fibromatosis of both feet [M72.2]. Tanya Nguyen primary complain today is feet pain  Pertinent problems: Tanya Nguyen has Migraines; PTSD (post-traumatic stress disorder); Plantar fascial fibromatosis of both feet; Sacroiliac joint pain; Idiopathic peripheral neuropathy; Chronic pain syndrome; and Pain management contract signed on their pertinent problem list. Pain Assessment: Severity of Neuropathic pain is reported as a 10-Worst pain ever/10. Location: Foot Right, Left/up back of legs below knee. Onset: More than a month ago. Quality: Burning, Constant, Numbness, Tingling. Timing: Constant. Modifying factor(s): rest and medication. Vitals:  height is 4\' 11"  (1.499 m) and weight is 103 lb (46.7 kg). Her temporal temperature is 97.3 F (36.3 C) (abnormal). Her blood pressure is 119/69 and her pulse is 74. Her respiration is 16 and oxygen saturation is 100%.  BMI: Estimated body mass index is 20.8 kg/m as calculated from the following:   Height as of this encounter: 4\' 11"  (1.499 m).   Weight as of this encounter: 103 lb (46.7 kg). Last  encounter: 06/09/2022. Last procedure: Visit date not found.  Reason for encounter: evaluation for possible interventional PM therapy/treatment.   -Increased bilateral foot pain related to plantar fascitis -Requesting plantar fascitis steroid injection (previously done with Dr Logan Bores but she states they are remodeling their clinic and is having trouble getting in)     ROS  Constitutional: Denies any fever or chills Gastrointestinal: No reported hemesis, hematochezia, vomiting, or acute GI distress Musculoskeletal:  bilateral foot pain Neurological: No reported episodes of acute onset apraxia, aphasia, dysarthria, agnosia, amnesia, paralysis, loss of coordination, or loss of consciousness  Medication Review  ARIPiprazole, DULoxetine, SUMAtriptan, buPROPion ER, ferrous sulfate, levothyroxine, oxybutynin, pantoprazole, topiramate, traMADol, and traZODone  History Review  Allergy: Tanya Nguyen is allergic to penicillins, nsaids, gabapentin, pregabalin, and sulfa antibiotics. Drug: Tanya Nguyen  reports no history of drug use. Alcohol:  reports that she does not currently use alcohol. Tobacco:  reports that she has never smoked. She has never used smokeless tobacco. Social: Tanya Nguyen  reports that she has never smoked. She has never used smokeless tobacco. She reports that she does not currently use alcohol. She reports that she does not use drugs. Medical:  has a past medical history of Acquired pyloric stricture, Bleeding duodenal ulcer, CAP (community acquired pneumonia) (02/12/2020), Chronic esophagogastric ulcer, Depression, Duodenal obstruction, Gastric outlet obstruction (01/19/2018), GERD (gastroesophageal reflux disease), Hair loss (09/22/2018), History of stomach ulcers, Hypothyroidism, Incisional hernia, without obstruction or gangrene, Intractable vomiting, Kidney stones, Melena (05/08/2021), Migraine, Migraine headache, Neuropathy, Side pain (03/30/2018), and Upper GI bleed  (05/18/2021). Surgical: Tanya Nguyen  has a past surgical history that includes Abdominal hysterectomy (2004); Cholecystectomy; Tooth extraction; Esophagogastroduodenoscopy (egd) with propofol (N/A, 01/12/2018); laparotomy (N/A, 01/20/2018);  Esophagogastroduodenoscopy (egd) with propofol (N/A, 01/25/2018); Colonoscopy with propofol (N/A, 02/27/2018); Esophagogastroduodenoscopy (egd) with propofol (N/A, 02/27/2018); Incisional hernia repair (N/A, 11/23/2018); Insertion of mesh (N/A, 11/23/2018); Esophagogastroduodenoscopy (egd) with propofol (N/A, 05/09/2021); and Esophagogastroduodenoscopy (egd) with propofol (N/A, 03/09/2022). Family: family history includes Alcohol abuse in her maternal grandmother and sister; Bipolar disorder in her sister; Breast cancer (age of onset: 1) in her mother; Cancer in her mother; Drug abuse in her sister; Lung cancer in her father; Stroke in her maternal grandmother and paternal grandmother.  Laboratory Chemistry Profile   Renal Lab Results  Component Value Date   BUN 16 11/23/2021   CREATININE 0.92 11/23/2021   BCR 23 02/09/2019   GFR 69.29 11/23/2021   GFRAA 82 02/09/2019   GFRNONAA >60 09/21/2021    Hepatic Lab Results  Component Value Date   AST 13 11/23/2021   ALT 10 11/23/2021   ALBUMIN 3.7 11/23/2021   ALKPHOS 68 11/23/2021   LIPASE 40 01/19/2018    Electrolytes Lab Results  Component Value Date   NA 143 11/23/2021   K 3.8 11/23/2021   CL 110 11/23/2021   CALCIUM 8.6 11/23/2021   MG 1.8 01/26/2018   PHOS 4.8 (H) 01/26/2018    Bone Lab Results  Component Value Date   VD25OH 38.39 11/23/2021    Inflammation (CRP: Acute Phase) (ESR: Chronic Phase) No results found for: "CRP", "ESRSEDRATE", "LATICACIDVEN"       Note: Above Lab results reviewed.  Recent Imaging Review  DG UGI W SMALL BOWEL SINGLE CM CLINICAL DATA:  Provided history: Dysphagia, unspecified type. Abdominal pain and nausea. Additional history provided: The patient reports  dysphagia, abdominal pain, occasional nausea/vomiting, difficulty swallowing pills. Additional history obtained from electronic MEDICAL RECORD NUMBERPrior gastrojejunostomy and jejunojejunostomy.  EXAM: DG UGI W/ SMALL BOWEL  TECHNIQUE: A scout radiograph was obtained. Subsequently, a combined double and single contrast examination was performed using effervescent crystals, high-density barium and thin liquid barium. The exam was performed by Loyce Dys PA-C, and was supervised and interpreted by Dr. Jackey Loge.  FLUOROSCOPY TIME:  Radiation Exposure Index (as provided by the fluoroscopic device): 81.5 mGy  COMPARISON:  Chest CT 10/06/2021.  CT abdomen/pelvis 09/21/2021.  FINDINGS: A scout radiograph of the abdomen was obtained prior to the upper GI series. Nonobstructive bowel gas pattern. Small colonic stool burden. Surgical clips within the right upper quadrant of the abdomen. Sutures within the left upper quadrant of the abdomen. Partially imaged dextrocurvature of the thoracolumbar spine.  Fluoroscopic evaluation demonstrates normal caliber and smooth contour of the esophagus. No evidence of fixed stricture, mass or mucosal abnormality.  Normal esophageal motility was observed.  Small sliding hiatal hernia.  No gastroesophageal reflux was observed.  The patient swallowed a 13 mm barium tablet, which freely passed into the stomach.  The patient has a history of gastrojejunostomy and jejunojejunostomy.  Thickened appearance of the gastric folds, suggesting gastritis.  There was dilation of proximal jejunal loops (measuring up to 7 cm in diameter). However, there was no significant delay in contrast transit to suggest a small bowel obstruction.  The terminal ileum was somewhat poorly visualized due to overlapping bowel loops (despite paddle compression). Within this limitation, there was no evidence of a stricture or mass more distally within the small  bowel.  IMPRESSION: 1. Small sliding hiatal hernia. 2. Prior gastrojejunostomy and jejunojejunostomy. 3. Thickened appearance of the gastric folds, suggesting gastritis. Endoscopy should be considered for further evaluation. 4. Dilation of proximal jejunal loops (measuring up to 7  cm in diameter). However, no significant delay in contrast transit to suggest a small bowel obstruction. 5. The terminal ileum was somewhat poorly visualized due to overlapping bowel loops (despite paddle compression). Within this limitation, otherwise unremarkable appearance of the small bowel.  Electronically Signed   By: Jackey Loge D.O.   On: 03/17/2022 13:19 Note: Reviewed        Physical Exam  General appearance: Well nourished, well developed, and well hydrated. In no apparent acute distress Mental status: Alert, oriented x 3 (person, place, & time)       Respiratory: No evidence of acute respiratory distress Eyes: PERLA Vitals: BP 119/69   Pulse 74   Temp (!) 97.3 F (36.3 C) (Temporal)   Resp 16   Ht 4\' 11"  (1.499 m)   Wt 103 lb (46.7 kg)   SpO2 100%   BMI 20.80 kg/m  BMI: Estimated body mass index is 20.8 kg/m as calculated from the following:   Height as of this encounter: 4\' 11"  (1.499 m).   Weight as of this encounter: 103 lb (46.7 kg). Ideal: Patient must be at least 60 in tall to calculate ideal body weight  Bilateral foot pain   Assessment   Diagnosis Status  1. Plantar fascial fibromatosis of both feet   2. Idiopathic peripheral neuropathy   3. Chronic pain syndrome    Having a Flare-up Controlled Controlled     Plan of Care    Orders:  Orders Placed This Encounter  Procedures   Injection tendon or ligament    Standing Status:   Future    Standing Expiration Date:   10/22/2022    Scheduling Instructions:     Type of Block:  plantar fascia injection bilateral     Timeframe: ASAA   Follow-up plan:   Return in about 6 days (around 07/28/2022) for B/L plantar  fascia injections.     Recent Visits Date Type Provider Dept  06/09/22 Office Visit Edward Jolly, MD [facility]  Showing recent visits within past 90 days and meeting all other requirements Today's Visits Date Type Provider Dept  07/22/22 Office Visit Edward Jolly, MD [facility]  Showing today's visits and meeting all other requirements Future Appointments No visits were found meeting these conditions. Showing future appointments within next 90 days and meeting all other requirements  I discussed the assessment and treatment plan with the patient. The patient was provided an opportunity to ask questions and all were answered. The patient agreed with the plan and demonstrated an understanding of the instructions.  Patient advised to call back or seek an in-person evaluation if the symptoms or condition worsens.  Duration of encounter: 15 minutes.  Total time on encounter, as per AMA guidelines included both the face-to-face and non-face-to-face time personally spent by the physician and/or other qualified health care professional(s) on the day of the encounter (includes time in activities that require the physician or other qualified health care professional and does not include time in activities normally performed by clinical staff). Physician's time may include the following activities when performed: Preparing to see the patient (e.g., pre-charting review of records, searching for previously ordered imaging, lab work, and nerve conduction tests) Review of prior analgesic pharmacotherapies. Reviewing PMP Interpreting ordered tests (e.g., lab work, imaging, nerve conduction tests) Performing post-procedure evaluations, including interpretation of diagnostic procedures Obtaining and/or reviewing separately obtained history Performing a medically appropriate examination and/or evaluation Counseling and educating the patient/family/caregiver Ordering medications,  tests, or procedures  Referring and communicating with other health care professionals (when not separately reported) Documenting clinical information in the electronic or other health record Independently interpreting results (not separately reported) and communicating results to the patient/ family/caregiver Care coordination (not separately reported)  Note by: Edward Jolly, MD Date: 07/22/2022; Time: 10:46 AM
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
## 2022-07-26 ENCOUNTER — Encounter: Payer: Self-pay | Admitting: Psychiatry

## 2022-07-26 ENCOUNTER — Ambulatory Visit: Payer: Medicare HMO | Admitting: Psychiatry

## 2022-07-26 VITALS — BP 105/69 | HR 73 | Temp 97.8°F | Ht 59.0 in | Wt 105.0 lb

## 2022-07-26 DIAGNOSIS — F3342 Major depressive disorder, recurrent, in full remission: Secondary | ICD-10-CM

## 2022-07-26 DIAGNOSIS — F4310 Post-traumatic stress disorder, unspecified: Secondary | ICD-10-CM | POA: Diagnosis not present

## 2022-07-28 ENCOUNTER — Encounter: Payer: Self-pay | Admitting: Student in an Organized Health Care Education/Training Program

## 2022-07-28 ENCOUNTER — Ambulatory Visit
Payer: Medicare HMO | Attending: Student in an Organized Health Care Education/Training Program | Admitting: Student in an Organized Health Care Education/Training Program

## 2022-07-28 VITALS — BP 105/58 | HR 83 | Temp 99.0°F | Resp 16 | Ht 59.0 in | Wt 103.0 lb

## 2022-07-28 DIAGNOSIS — G894 Chronic pain syndrome: Secondary | ICD-10-CM | POA: Diagnosis not present

## 2022-07-28 DIAGNOSIS — M722 Plantar fascial fibromatosis: Secondary | ICD-10-CM | POA: Diagnosis not present

## 2022-07-28 MED ORDER — LIDOCAINE HCL 2 % IJ SOLN
20.0000 mL | Freq: Once | INTRAMUSCULAR | Status: AC
  Administered 2022-07-28: 100 mg

## 2022-07-28 MED ORDER — DEXAMETHASONE SODIUM PHOSPHATE 10 MG/ML IJ SOLN
10.0000 mg | Freq: Once | INTRAMUSCULAR | Status: AC
  Administered 2022-07-28: 10 mg

## 2022-07-28 MED ORDER — DEXAMETHASONE SODIUM PHOSPHATE 10 MG/ML IJ SOLN
INTRAMUSCULAR | Status: AC
  Filled 2022-07-28: qty 1

## 2022-07-28 MED ORDER — LIDOCAINE HCL (PF) 2 % IJ SOLN
INTRAMUSCULAR | Status: AC
  Filled 2022-07-28: qty 10

## 2022-07-28 NOTE — Progress Notes (Signed)
PROVIDER NOTE: Interpretation of information contained herein should be left to medically-trained personnel. Specific patient instructions are provided elsewhere under "Patient Instructions" section of medical record. This document was created in part using STT-dictation technology, any transcriptional errors that may result from this process are unintentional.  Patient: Tanya Nguyen Type: Established DOB: 01-05-1966 MRN: [id number] PCP: Doreene Nest, NP  Service: Procedure DOS: 07/28/2022 Setting: Ambulatory Location: Ambulatory outpatient facility Delivery: Face-to-face Provider: Edward Jolly, MD Specialty: Interventional Pain Management Specialty designation: 09 Location: Outpatient facility Ref. Prov.: Doreene Nest, NP       Interventional Therapy   Type: Ligament/Tendon sheath (40981) Injection. #1  Laterality: Bilateral  Level: Plantar Fascia  Approach: Percutaneous   Imaging: N/A. Landmark-guided Anesthesia: Local anesthesia (1-2% Lidocaine) DOS: 07/28/2022  Performed by: Edward Jolly, MD  Rationale (medical necessity): procedure needed and proper for the diagnosis and/or treatment of the patient's medical symptoms and needs. Purpose: Diagnostic/Therapeutic Indications: plantar fascia enthesopathy (disorder of ligament or connective tissues. i.e.: tendons and/or ligaments) severe enough to impact quality of life or function. 1. Plantar fascial fibromatosis of both feet   2. Chronic pain syndrome    NAS-11 Pain score:   Pre-procedure: 10-Worst pain ever/10   Post-procedure: 10-Worst pain ever/10      Position / Prep / Materials:  Position: Sitting  Prep solution: DuraPrep (Iodine Povacrylex [0.7% available iodine] and Isopropyl Alcohol, 74% w/w) Prep Area: Bilateral feet    Pre-op H&P  Assessment  Time-out   Pre-op H&P Assessment:  Tanya Nguyen is a 57 y.o. (year old), female patient, seen today for interventional treatment. She  has a past surgical  history that includes Abdominal hysterectomy (2004); Cholecystectomy; Tooth extraction; Esophagogastroduodenoscopy (egd) with propofol (N/A, 01/12/2018); laparotomy (N/A, 01/20/2018); Esophagogastroduodenoscopy (egd) with propofol (N/A, 01/25/2018); Colonoscopy with propofol (N/A, 02/27/2018); Esophagogastroduodenoscopy (egd) with propofol (N/A, 02/27/2018); Incisional hernia repair (N/A, 11/23/2018); Insertion of mesh (N/A, 11/23/2018); Esophagogastroduodenoscopy (egd) with propofol (N/A, 05/09/2021); and Esophagogastroduodenoscopy (egd) with propofol (N/A, 03/09/2022). Tanya Nguyen has a current medication list which includes the following prescription(s): aripiprazole, duloxetine, duloxetine, levothyroxine, oxybutynin, pantoprazole, sumatriptan, topiramate, topiramate, [START ON 08/07/2022] tramadol, trazodone, bupropion er, and ferrous sulfate. Her primarily concern today is the Foot Pain (Bilateral )  Initial Vital Signs:  Pulse/HCG Rate: 83  Temp: 99 F (37.2 C) Resp: 16 BP: (!) 105/58 SpO2:    BMI: Estimated body mass index is 20.8 kg/m as calculated from the following:   Height as of this encounter: 4\' 11"  (1.499 m).   Weight as of this encounter: 103 lb (46.7 kg).  Risk Assessment: Allergies: Reviewed. She is allergic to penicillins, nsaids, gabapentin, pregabalin, and sulfa antibiotics.  Allergy Precautions: None required Coagulopathies: Reviewed. None identified.  Blood-thinner therapy: None at this time Active Infection(s): Reviewed. None identified. Tanya Nguyen is afebrile  Site Confirmation: Tanya Nguyen was asked to confirm the procedure and laterality before marking the site Procedure checklist: Completed Consent: Before the procedure and under the influence of no sedative(s), amnesic(s), or anxiolytics, the patient was informed of the treatment options, risks and possible complications. To fulfill our ethical and legal obligations, as recommended by the American Medical Association's Code of  Ethics, I have informed the patient of my clinical impression; the nature and purpose of the treatment or procedure; the risks, benefits, and possible complications of the intervention; the alternatives, including doing nothing; the risk(s) and benefit(s) of the alternative treatment(s) or procedure(s); and the risk(s) and benefit(s) of doing nothing. The patient was provided information about  the general risks and possible complications associated with the procedure. These may include, but are not limited to: failure to achieve desired goals, infection, bleeding, organ or nerve damage, allergic reactions, paralysis, and death. In addition, the patient was informed of those risks and complications associated to the procedure, such as failure to decrease pain; infection; bleeding; organ or nerve damage with subsequent damage to sensory, motor, and/or autonomic systems, resulting in permanent pain, numbness, and/or weakness of one or several areas of the body; allergic reactions; (i.e.: anaphylactic reaction); and/or death. Furthermore, the patient was informed of those risks and complications associated with the medications. These include, but are not limited to: allergic reactions (i.e.: anaphylactic or anaphylactoid reaction(s)); adrenal axis suppression; blood sugar elevation that in diabetics may result in ketoacidosis or comma; water retention that in patients with history of congestive heart failure may result in shortness of breath, pulmonary edema, and decompensation with resultant heart failure; weight gain; swelling or edema; medication-induced neural toxicity; particulate matter embolism and blood vessel occlusion with resultant organ, and/or nervous system infarction; and/or aseptic necrosis of one or more joints. Finally, the patient was informed that Medicine is not an exact science; therefore, there is also the possibility of unforeseen or unpredictable risks and/or possible complications that may  result in a catastrophic outcome. The patient indicated having understood very clearly. We have given the patient no guarantees and we have made no promises. Enough time was given to the patient to ask questions, all of which were answered to the patient's satisfaction. Tanya Nguyen has indicated that she wanted to continue with the procedure. Attestation: I, the ordering provider, attest that I have discussed with the patient the benefits, risks, side-effects, alternatives, likelihood of achieving goals, and potential problems during recovery for the procedure that I have provided informed consent. Date  Time: 07/28/2022  1:56 PM  Pre-Procedure Preparation:  Monitoring: As per clinic protocol. Respiration, ETCO2, SpO2, BP, heart rate and rhythm monitor placed and checked for adequate function Safety Precautions: Patient was assessed for positional comfort and pressure points before starting the procedure. Time-out: I initiated and conducted the "Time-out" before starting the procedure, as per protocol. The patient was asked to participate by confirming the accuracy of the "Time Out" information. Verification of the correct person, site, and procedure were performed and confirmed by me, the nursing staff, and the patient. "Time-out" conducted as per Joint Commission's Universal Protocol (UP.01.01.01). Time: 1427 Start Time: 1427 hrs.   Description  Start Time: 1427 hrs. Procedural Technique Safety Precautions: Aspiration looking for blood return was conducted prior to all injections. At no point did we inject any substances, as a needle was being advanced. No attempts were made at seeking any paresthesias. Safe injection practices and needle disposal techniques used. Medications properly checked for expiration dates. SDV (single dose vial) medications used. Description of the Procedure: Protocol guidelines were followed. The patient was assisted into a comfortable position. The target area was identified  and the area prepped in the usual manner. Skin & deeper tissues infiltrated with local anesthetic. Appropriate amount of time allowed to pass for local anesthetics to take effect. The procedure needles were then advanced to the target area. Proper needle placement secured. Negative aspiration confirmed. Solution injected in intermittent fashion, asking for systemic symptoms every 0.5cc of injectate. The needles were then removed and the area cleansed, making sure to leave some of the prepping solution back to take advantage of its long term bactericidal properties.  Area of maximal tenderness  palpated below the medial malleolus.  Local anesthetic injected.  5 cc solution containing 3 cc of 2% lidocaine, 2 cc of Decadron 10 mg/cc was constituted. 2.5 cc injected for the left plantar fascia injection, 2.5 cc injected for the right plantar fascia injection.  Vitals:   07/28/22 1400  BP: (!) 105/58  Pulse: 83  Resp: 16  Temp: 99 F (37.2 C)  TempSrc: Temporal  Weight: 103 lb (46.7 kg)  Height: 4\' 11"  (1.499 m)     End Time: 1432 hrs.  Imaging Guidance   :          Type of Imaging Technique: None used Indication(s): N/A Exposure Time: No patient exposure Contrast: None used. Fluoroscopic Guidance: N/A Ultrasound Guidance: N/A Interpretation: N/A  Post-operative Assessment:  Post-procedure Vital Signs:  Pulse/HCG Rate: 83  Temp: 99 F (37.2 C) Resp: 16 BP: (!) 105/58 SpO2:    EBL: None  Complications: No immediate post-treatment complications observed by team, or reported by patient.  Note: The patient tolerated the entire procedure well. A repeat set of vitals were taken after the procedure and the patient was kept under observation following institutional policy, for this type of procedure. Post-procedural neurological assessment was performed, showing return to baseline, prior to discharge. The patient was provided with post-procedure discharge instructions, including a  section on how to identify potential problems. Should any problems arise concerning this procedure, the patient was given instructions to immediately contact us, at any time, without hesitation. In any case, we plan to contact the patient by telephone for a follow-up status report regarding this interventional procedure.  Comments:  No additional relevant information.  Plan of Care (POC)   Medications ordered for procedure: Meds ordered this encounter  Medications   dexamethasone (DECADRON) injection 10 mg   dexamethasone (DECADRON) injection 10 mg   lidocaine (XYLOCAINE) 2 % (with pres) injection 400 mg   Medications administered: We administered dexamethasone, dexamethasone, and lidocaine.  See the medical record for exact dosing, route, and time of administration.  Follow-up plan:   Return for patient to call for appointment for med refill when needed..       Bilateral plantar fascia injection 07/28/2022    Recent Visits Date Type Provider Dept  07/22/22 Office Visit Edward Jolly, MD [facility]  06/09/22 Office Visit Edward Jolly, MD [facility]  Showing recent visits within past 90 days and meeting all other requirements Today's Visits Date Type Provider Dept  07/28/22 Procedure visit Edward Jolly, MD [facility]  Showing today's visits and meeting all other requirements Future Appointments No visits were found meeting these conditions. Showing future appointments within next 90 days and meeting all other requirements  Disposition: Discharge home  Discharge (Date  Time): 07/28/2022; 1445 hrs.   Primary Care Physician: Doreene Nest, NP Location: [facility] Note by: Edward Jolly, MD (TTS technology used. I apologize for any typographical errors that were not detected and corrected.) Date: 07/28/2022; Time: 2:55 PM  Disclaimer:  Medicine is not an Visual merchandiser. The only guarantee in medicine is that  nothing is guaranteed. It is important to note that the decision to proceed with this intervention was based on the information collected from the patient. The Data and conclusions were drawn from the patient's questionnaire, the interview, and the physical examination. Because the information was provided in large part by the patient, it cannot be guaranteed that it has not been purposely or unconsciously manipulated. Every effort has  been made to obtain as much relevant data as possible for this evaluation. It is important to note that the conclusions that lead to this procedure are derived in large part from the available data. Always take into account that the treatment will also be dependent on availability of resources and existing treatment guidelines, considered by other Pain Management Practitioners as being common knowledge and practice, at the time of the intervention. For Medico-Legal purposes, it is also important to point out that variation in procedural techniques and pharmacological choices are the acceptable norm. The indications, contraindications, technique, and results of the above procedure should only be interpreted and judged by a Board-Certified Interventional Pain Specialist with extensive familiarity and expertise in the same exact procedure and technique.

## 2022-07-28 NOTE — Patient Instructions (Signed)

## 2022-07-28 NOTE — Progress Notes (Signed)
Safety precautions to be maintained throughout the outpatient stay will include: orient to surroundings, keep bed in low position, maintain call bell within reach at all times, provide assistance with transfer out of bed and ambulation.  

## 2022-07-29 ENCOUNTER — Telehealth: Payer: Self-pay

## 2022-07-29 NOTE — Telephone Encounter (Signed)
Post procedure follow up.  LM 

## 2022-08-03 ENCOUNTER — Ambulatory Visit
Admission: RE | Admit: 2022-08-03 | Discharge: 2022-08-03 | Disposition: A | Discharge status: Home / Self Care | Payer: Medicare HMO | Source: Ambulatory Visit | Attending: Primary Care | Admitting: Primary Care

## 2022-08-03 DIAGNOSIS — Z1231 Encounter for screening mammogram for malignant neoplasm of breast: Secondary | ICD-10-CM | POA: Insufficient documentation

## 2022-08-10 ENCOUNTER — Other Ambulatory Visit: Payer: Self-pay | Admitting: Primary Care

## 2022-08-10 DIAGNOSIS — G43111 Migraine with aura, intractable, with status migrainosus: Secondary | ICD-10-CM

## 2022-08-16 ENCOUNTER — Other Ambulatory Visit: Payer: Self-pay | Admitting: Primary Care

## 2022-08-16 DIAGNOSIS — G43111 Migraine with aura, intractable, with status migrainosus: Secondary | ICD-10-CM

## 2022-08-18 NOTE — Telephone Encounter (Signed)
Patient called back in, she states she is having migraines 5-7 times a month. She states she takes 1/2 a tab first to see if this will resolve the migraine and if that doesn't then she will take the other 1/2. Patient states she has never taken two tablets for the same migraine. Patient states she has 1 pill left.

## 2022-08-18 NOTE — Telephone Encounter (Signed)
Patient called in to follow up on this refill request. Thank you! 

## 2022-08-18 NOTE — Telephone Encounter (Signed)
Called and advised patient of Tanya Nguyen message. Scheduled patient appt at 12:20 on 08/19/22

## 2022-08-18 NOTE — Telephone Encounter (Signed)
Patient needs an office visit to discuss recurrent migraines.  She also needs to take the full sumatriptan tablet as prescribed as this will be more effective in aborting her migraine.  Please have her scheduled for this week.

## 2022-08-18 NOTE — Telephone Encounter (Signed)
Attempted to call patient, unable to reach. Left voicemail for patient to return call to office.

## 2022-08-19 ENCOUNTER — Ambulatory Visit (INDEPENDENT_AMBULATORY_CARE_PROVIDER_SITE_OTHER): Payer: Medicare HMO | Admitting: Primary Care

## 2022-08-19 ENCOUNTER — Encounter: Payer: Self-pay | Admitting: Primary Care

## 2022-08-19 DIAGNOSIS — G43111 Migraine with aura, intractable, with status migrainosus: Secondary | ICD-10-CM | POA: Diagnosis not present

## 2022-08-19 MED ORDER — KETOROLAC TROMETHAMINE 60 MG/2ML IM SOLN
60.0000 mg | Freq: Once | INTRAMUSCULAR | Status: AC
Start: 2022-08-19 — End: 2022-08-19
  Administered 2022-08-19: 60 mg via INTRAMUSCULAR

## 2022-08-19 MED ORDER — SUMATRIPTAN SUCCINATE 50 MG PO TABS
ORAL_TABLET | ORAL | 0 refills | Status: DC
Start: 2022-08-19 — End: 2022-11-22

## 2022-08-19 NOTE — Patient Instructions (Addendum)
Remember to take the full 50 mg tablet of the Imitrex medication as needed for migraine.  Work on stress level and anxiety as discussed.  Continue Topamax 125 mg daily for now for headache prevention.  Please update [place] if your migraines do not improve.  Please schedule a physical to meet with me in 3-4 months.   It was a pleasure to see you today!

## 2022-08-19 NOTE — Assessment & Plan Note (Signed)
Uncontrolled.  Discussed options for treatment which included her working on her stress level versus increasing topiramate. We both agree for her to work with her psychiatrist regarding her stress level as this is likely contributing to recurrent migraines.  Consider increasing topiramate to 150 mg daily in divided doses. Also discussed to take the full 50 mg sumatriptan tablet at migraine onset for better migraine resolve.  IM Toradol 60 mg provided today. She will update if migraines persist.

## 2022-08-19 NOTE — Progress Notes (Signed)
Subjective:    Patient ID: Tanya Nguyen, female    DOB: 10/18/1965, 57 y.o.   MRN: [id number]  Migraine  Associated symptoms include photophobia. Pertinent negatives include no nausea.    Tanya Nguyen is a very pleasant 57 y.o. female with a history of migraines, frequent headaches, hypothyroidism, Bipolar Disorder, chronic pain syndrome, fatigue, insomnia who presents today to discuss migraines.  Currently managed on Topamax 125 mg daily for headache and migraine prevention and sumatriptan 50 mg for migraine abortion.   Over the last 2-3 months she's noticed an increase in migraine frequency. She is now experiencing 9 migraines per month with her last migraine beginning this morning for which is present now.  Her migraines are mostly located to the right parietal lobe with photophobia and nausea. She's been taking her sumatriptan, but has been taking 1/2 of the prescribed dose initially and will take the second half about 2 hours later.   She is under increased stress as her mother is now bedridden. She does follow with psychiatry, feels better in terms of her depression.     Review of Systems  Eyes:  Positive for photophobia.  Gastrointestinal:  Negative for nausea.  Neurological:  Positive for headaches.  Psychiatric/Behavioral:  The patient is nervous/anxious.          Past Medical History:  Diagnosis Date   Acquired pyloric stricture    Bleeding duodenal ulcer    CAP (community acquired pneumonia) 02/12/2020   Chronic esophagogastric ulcer    Depression    Duodenal obstruction    Gastric outlet obstruction 01/19/2018   GERD (gastroesophageal reflux disease)    Hair loss 09/22/2018   History of stomach ulcers    Hypothyroidism    Incisional hernia, without obstruction or gangrene    Intractable vomiting    Kidney stones    Melena 05/08/2021   Migraine    Migraine headache    only1-2x/month since starting topamax   Neuropathy    Side pain 03/30/2018   Upper GI  bleed 05/18/2021    Social History   Socioeconomic History   Marital status: Married    Spouse name: Not on file   Number of children: 0   Years of education: 12   Highest education level: High school graduate  Occupational History   Occupation: Homemaker  Tobacco Use   Smoking status: Never   Smokeless tobacco: Never  Vaping Use   Vaping Use: Never used  Substance and Sexual Activity   Alcohol use: Not Currently    Alcohol/week: 0.0 standard drinks of alcohol   Drug use: No   Sexual activity: Not Currently    Birth control/protection: None  Other Topics Concern   Not on file  Social History Narrative   Lives at home with her husband.   Right-handed.   Rare caffeine.   Social Determinants of Health   Financial Resource Strain: Not on file  Food Insecurity: Not on file  Transportation Needs: Not on file  Physical Activity: Not on file  Stress: Not on file  Social Connections: Not on file  Intimate Partner Violence: Not on file    Past Surgical History:  Procedure Laterality Date   ABDOMINAL HYSTERECTOMY  2004   partial   CHOLECYSTECTOMY     COLONOSCOPY WITH PROPOFOL N/A 02/27/2018   Procedure: COLONOSCOPY WITH PROPOFOL;  Surgeon: Midge Minium, MD;  Location: [facility];  Service: Endoscopy;  Laterality: N/A;   ESOPHAGOGASTRODUODENOSCOPY (EGD) WITH PROPOFOL N/A 01/12/2018   Procedure: ESOPHAGOGASTRODUODENOSCOPY (  EGD) WITH BIOPSIES;  Surgeon: Midge Minium, MD;  Location: [facility];  Service: Endoscopy;  Laterality: N/A;   ESOPHAGOGASTRODUODENOSCOPY (EGD) WITH PROPOFOL N/A 01/25/2018   Procedure: ESOPHAGOGASTRODUODENOSCOPY (EGD) WITH PROPOFOL;  Surgeon: Wyline Mood, MD;  Location: [facility];  Service: Gastroenterology;  Laterality: N/A;   ESOPHAGOGASTRODUODENOSCOPY (EGD) WITH PROPOFOL N/A 02/27/2018   Procedure: ESOPHAGOGASTRODUODENOSCOPY (EGD) WITH PROPOFOL;  Surgeon: Midge Minium, MD;  Location: [facility];  Service: Endoscopy;   Laterality: N/A;   ESOPHAGOGASTRODUODENOSCOPY (EGD) WITH PROPOFOL N/A 05/09/2021   Procedure: ESOPHAGOGASTRODUODENOSCOPY (EGD) WITH PROPOFOL;  Surgeon: Jaynie Collins, DO;  Location: [facility];  Service: Gastroenterology;  Laterality: N/A;   ESOPHAGOGASTRODUODENOSCOPY (EGD) WITH PROPOFOL N/A 03/09/2022   Procedure: ESOPHAGOGASTRODUODENOSCOPY (EGD) WITH PROPOFOL;  Surgeon: Midge Minium, MD;  Location: [facility] END[facility];  Service: Endoscopy;  Laterality: N/A;   INCISIONAL HERNIA REPAIR N/A 11/23/2018   Procedure: HERNIA REPAIR INCISIONAL;  Surgeon: Henrene Dodge, MD;  Location: [facility];  Service: General;  Laterality: N/A;   INSERTION OF MESH N/A 11/23/2018   Procedure: INSERTION OF MESH;  Surgeon: Henrene Dodge, MD;  Location: [facility];  Service: General;  Laterality: N/A;   LAPAROTOMY N/A 01/20/2018   Procedure: EXPLORATORY LAPAROTOMY;  Surgeon: Henrene Dodge, MD;  Location: [facility];  Service: General;  Laterality: N/A;   TOOTH EXTRACTION      Family History  Problem Relation Age of Onset   Cancer Mother    Breast cancer Mother 91   Lung cancer Father    Drug abuse Sister    Alcohol abuse Sister    Bipolar disorder Sister    Alcohol abuse Maternal Grandmother    Stroke Maternal Grandmother    Stroke Paternal Grandmother     Allergies  Allergen Reactions   Penicillins Shortness Of Breath, Diarrhea and Nausea And Vomiting    Has patient had a PCN reaction causing immediate rash, facial/tongue/throat swelling, SOB or lightheadedness with hypotension: yes Has patient had a PCN reaction causing severe rash involving mucus membranes or skin necrosis: no Has patient had a PCN reaction that required hospitalization no Has patient had a PCN reaction occurring within the last 10 years: about 10 years If all of the above answers are "NO", then may proceed with Cephalosporin use.    Nsaids Other (See Comments)    Pt states she is not allergic  Other reaction(s): OTHER Pt states NSAIDS  cause "internal bleeding"   Gabapentin Other (See Comments)    Hair Loss    Pregabalin     Tremors   Sulfa Antibiotics Nausea And Vomiting    Current Outpatient Medications on File Prior to Visit  Medication Sig Dispense Refill   ARIPiprazole (ABILIFY) 2 MG tablet Take 1 tablet (2 mg total) by mouth at bedtime. 90 tablet 0   DULoxetine (CYMBALTA) 30 MG capsule TAKE 1 CAPSULE (30 MG TOTAL) BY MOUTH DAILY. FOR ANXIETY AND PAIN. TAKE WITH 60 MG. 90 capsule 3   DULoxetine (CYMBALTA) 60 MG capsule TAKE 1 CAPSULE (60 MG TOTAL) BY MOUTH DAILY. FOR ANXIETY AND PAIN. 90 capsule 1   levothyroxine (SYNTHROID) 88 MCG tablet TAKE 1 TABLET BY MOUTH EVERY MORNING ON EMPTY STOMACH WITH WATER ONLY-NO FOOD OR OTHER MED FOR 90 tablet 2   oxybutynin (DITROPAN-XL) 5 MG 24 hr tablet TAKE 1 TABLET (5 MG TOTAL) BY MOUTH AT BEDTIME. FOR BLADDER INCONTINENCE. 90 tablet 2   pantoprazole (PROTONIX) 40 MG tablet TAKE 1 TABLET BY MOUTH EVERY DAY 90 tablet 2  topiramate (TOPAMAX) 100 MG tablet TAKE 1 TABLET (100 MG TOTAL) BY MOUTH AT BEDTIME. FOR MIGRAINE PREVENTION. 90 tablet 3   topiramate (TOPAMAX) 25 MG tablet TAKE 1 TABLET (25 MG TOTAL) BY MOUTH AT BEDTIME. FOR MIGRAINES. TAKE WITH 100 MG DOSE. 90 tablet 3   traMADol (ULTRAM) 50 MG tablet Take 1 tablet (50 mg total) by mouth every 12 (twelve) hours as needed. 60 tablet 1   traZODone (DESYREL) 50 MG tablet Take 3 tablets (150 mg total) by mouth at bedtime as needed for sleep. For sleep. 270 tablet 0   ferrous sulfate 325 (65 FE) MG tablet Take 1 tablet (325 mg total) by mouth every other day. (Patient not taking: Reported on 07/28/2022) 30 tablet 0   No current facility-administered medications on file prior to visit.    BP (!) 94/58   Pulse 73   Temp 99 F (37.2 C) (Temporal)   Ht 4\' 11"  (1.499 m)   Wt 110 lb (49.9 kg)   SpO2 99%   BMI 22.22 kg/m  Objective:   Physical Exam Cardiovascular:     Rate and Rhythm: Normal rate and regular rhythm.   Pulmonary:     Effort: Pulmonary effort is normal.  Musculoskeletal:     Cervical back: Neck supple.  Skin:    General: Skin is warm and dry.  Neurological:     Mental Status: She is alert and oriented to person, place, and time.     Cranial Nerves: No cranial nerve deficit.           Assessment & Plan:  Intractable migraine with aura with status migrainosus Assessment & Plan: Uncontrolled.  Discussed options for treatment which included her working on her stress level versus increasing topiramate. We both agree for her to work with her psychiatrist regarding her stress level as this is likely contributing to recurrent migraines.  Consider increasing topiramate to 150 mg daily in divided doses. Also discussed to take the full 50 mg sumatriptan tablet at migraine onset for better migraine resolve.  IM Toradol 60 mg provided today. She will update if migraines persist.  Orders: -     SUMAtriptan Succinate; Take 1 tablet at migraine onset. May repeat in 2 hours if headache persists or recurs.  Dispense: 9 tablet; Refill: 0        Doreene Nest, NP

## 2022-08-19 NOTE — Addendum Note (Signed)
Addended by: Lonia Blood on: 08/19/2022 12:35 PM   Modules accepted: Orders

## 2022-09-16 ENCOUNTER — Other Ambulatory Visit: Payer: Self-pay | Admitting: Primary Care

## 2022-09-16 DIAGNOSIS — G43111 Migraine with aura, intractable, with status migrainosus: Secondary | ICD-10-CM

## 2022-10-09 ENCOUNTER — Other Ambulatory Visit: Payer: Self-pay | Admitting: Psychiatry

## 2022-10-11 ENCOUNTER — Other Ambulatory Visit: Payer: Self-pay | Admitting: Psychiatry

## 2022-10-11 MED ORDER — TRAZODONE HCL 50 MG PO TABS: 150.0000 mg | ORAL_TABLET | Freq: Every evening | ORAL | 0 refills | Status: DC | PRN
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
## 2022-10-16 ENCOUNTER — Other Ambulatory Visit: Payer: Self-pay | Admitting: Student in an Organized Health Care Education/Training Program

## 2022-10-16 DIAGNOSIS — G609 Hereditary and idiopathic neuropathy, unspecified: Secondary | ICD-10-CM

## 2022-10-16 DIAGNOSIS — G894 Chronic pain syndrome: Secondary | ICD-10-CM

## 2022-10-18 ENCOUNTER — Encounter: Payer: Self-pay | Admitting: Psychiatry

## 2022-10-18 ENCOUNTER — Other Ambulatory Visit: Payer: Self-pay | Admitting: Gastroenterology

## 2022-10-18 ENCOUNTER — Ambulatory Visit (INDEPENDENT_AMBULATORY_CARE_PROVIDER_SITE_OTHER): Payer: Medicare HMO | Admitting: Psychiatry

## 2022-10-18 VITALS — BP 125/73 | HR 72 | Wt 112.0 lb

## 2022-10-18 DIAGNOSIS — F3342 Major depressive disorder, recurrent, in full remission: Secondary | ICD-10-CM | POA: Diagnosis not present

## 2022-10-18 DIAGNOSIS — F4310 Post-traumatic stress disorder, unspecified: Secondary | ICD-10-CM | POA: Diagnosis not present

## 2022-10-18 DIAGNOSIS — G4700 Insomnia, unspecified: Secondary | ICD-10-CM | POA: Diagnosis not present

## 2022-10-18 MED ORDER — ARIPIPRAZOLE 2 MG PO TABS: 2.0000 mg | ORAL_TABLET | Freq: Every day | ORAL | 0 refills | Status: DC

## 2022-10-18 NOTE — Patient Instructions (Signed)
Continue duloxetine 90 mg daily Continue Abilify 2 mg at night Next appointment: 10/31 at 4 pm

## 2022-11-09 ENCOUNTER — Other Ambulatory Visit: Payer: Self-pay | Admitting: Primary Care

## 2022-11-09 DIAGNOSIS — G609 Hereditary and idiopathic neuropathy, unspecified: Secondary | ICD-10-CM

## 2022-11-09 DIAGNOSIS — M722 Plantar fascial fibromatosis: Secondary | ICD-10-CM

## 2022-11-18 ENCOUNTER — Encounter: Payer: Self-pay | Admitting: Student in an Organized Health Care Education/Training Program

## 2022-11-18 ENCOUNTER — Ambulatory Visit
Payer: Medicare HMO | Attending: Student in an Organized Health Care Education/Training Program | Admitting: Student in an Organized Health Care Education/Training Program

## 2022-11-18 VITALS — BP 113/65 | HR 94 | Temp 98.7°F | Resp 16 | Ht <= 58 in | Wt 106.7 lb

## 2022-11-18 DIAGNOSIS — M722 Plantar fascial fibromatosis: Secondary | ICD-10-CM | POA: Insufficient documentation

## 2022-11-18 DIAGNOSIS — G609 Hereditary and idiopathic neuropathy, unspecified: Secondary | ICD-10-CM | POA: Diagnosis not present

## 2022-11-18 DIAGNOSIS — Z0289 Encounter for other administrative examinations: Secondary | ICD-10-CM | POA: Insufficient documentation

## 2022-11-18 DIAGNOSIS — G894 Chronic pain syndrome: Secondary | ICD-10-CM | POA: Diagnosis not present

## 2022-11-18 MED ORDER — TRAMADOL HCL 50 MG PO TABS
50.0000 mg | ORAL_TABLET | Freq: Two times a day (BID) | ORAL | 1 refills | Status: DC | PRN
Start: 2022-11-18 — End: 2023-02-01

## 2022-11-18 NOTE — Progress Notes (Signed)
Nursing Pain Medication Assessment:  Safety precautions to be maintained throughout the outpatient stay will include: orient to surroundings, keep bed in low position, maintain call bell within reach at all times, provide assistance with transfer out of bed and ambulation.  Medication Inspection Compliance: Pill count conducted under aseptic conditions, in front of the patient. Neither the pills nor the bottle was removed from the patient's sight at any time. Once count was completed pills were immediately returned to the patient in their original bottle.  Medication: Tramadol (Ultram) Pill/Patch Count:  08 of 60 pills remain Pill/Patch Appearance: Markings consistent with prescribed medication Bottle Appearance: Standard pharmacy container. Clearly labeled. Filled Date: 06 / 07 / 2024 Last Medication intake:  Today

## 2022-11-18 NOTE — Progress Notes (Signed)
PROVIDER NOTE: Information contained herein reflects review and annotations entered in association with encounter. Interpretation of such information and data should be left to medically-trained personnel. Information provided to patient can be located elsewhere in the medical record under "Patient Instructions". Document created using STT-dictation technology, any transcriptional errors that may result from process are unintentional.    Patient: Tanya Nguyen  Service Category: E/M  Provider: Edward Jolly, MD  DOB: February 08, 1966  DOS: 11/18/2022  Referring Provider: Doreene Nest, NP  MRN: [id number]  Specialty: Interventional Pain Management  PCP: Doreene Nest, NP  Type: Established Patient  Setting: Ambulatory outpatient    Location: Office  Delivery: Face-to-face     HPI  [name], a 57 y.o. year old female, is here today because of her Plantar fascial fibromatosis of both feet [M72.2]. [name] primary complain today is feet pain  Pertinent problems: [name] has Migraines; PTSD (post-traumatic stress disorder); Plantar fascial fibromatosis of both feet; Sacroiliac joint pain; Idiopathic peripheral neuropathy; Chronic pain syndrome; and Pain management contract signed on their pertinent problem list. Pain Assessment: Severity of Chronic pain is reported as a 7 /10. Location: Foot Right, Left/from bottom of feet upwards front of leg to the knee bilateral. Onset: More than a month ago. Quality: Constant, Tightness, Burning, Sore. Timing: Constant. Modifying factor(s): Tramadol and injection. Vitals:  height is 4\' 7"  (1.397 m) and weight is 106 lb 11.2 oz (48.4 kg). Her temporal temperature is 98.7 F (37.1 C). Her blood pressure is 113/65 and her pulse is 94. Her respiration is 16 and oxygen saturation is 98%.  BMI: Estimated body mass index is 24.8 kg/m as calculated from the following:   Height as of this encounter: 4\' 7"  (1.397 m).   Weight as of this encounter: 106 lb 11.2 oz  (48.4 kg). Last encounter: 07/22/2022. Last procedure: 07/28/2022.  Reason for encounter: medication management. Also patient is requesting repeat bilateral plantar fascia injections that she had done in 07/28/22 that provided her 70% pain relief for approx 3 months   Pharmacotherapy Assessment  Analgesic:  08/13/2022 06/09/2022  1 Tramadol Hcl 50 Mg Tablet 60.00 30 Bi Lat [id number] Nor (4575) 1/1 20.00 MME Medicare [place]    Monitoring: [place] PMP: PDMP reviewed during this encounter.       Pharmacotherapy: No side-effects or adverse reactions reported. Compliance: No problems identified. Effectiveness: Clinically acceptable.  Earlyne Iba, RN  11/18/2022  1:04 PM  Sign when Signing Visit Nursing Pain Medication Assessment:  Safety precautions to be maintained throughout the outpatient stay will include: orient to surroundings, keep bed in low position, maintain call bell within reach at all times, provide assistance with transfer out of bed and ambulation.  Medication Inspection Compliance: Pill count conducted under aseptic conditions, in front of the patient. Neither the pills nor the bottle was removed from the patient's sight at any time. Once count was completed pills were immediately returned to the patient in their original bottle.  Medication: Tramadol (Ultram) Pill/Patch Count:  08 of 60 pills remain Pill/Patch Appearance: Markings consistent with prescribed medication Bottle Appearance: Standard pharmacy container. Clearly labeled. Filled Date: 06 / 07 / 2024 Last Medication intake:  Today    No results found for: "CBDTHCR" No results found for: "D8THCCBX" No results found for: "D9THCCBX"  UDS:  Summary  Date Value Ref Range Status  03/13/2020 Note  Final    Comment:    ==================================================================== Compliance Drug Analysis, Ur ==================================================================== Test  Result        Flag       Units  Drug Present and Declared for Prescription Verification   Tramadol                       >11905       EXPECTED   ng/mg creat   O-Desmethyltramadol            9229         EXPECTED   ng/mg creat   N-Desmethyltramadol            3952         EXPECTED   ng/mg creat    Source of tramadol is a prescription medication. O-desmethyltramadol    and N-desmethyltramadol are expected metabolites of tramadol.    Pregabalin                     PRESENT      EXPECTED   Topiramate                     PRESENT      EXPECTED   Cyclobenzaprine                PRESENT      EXPECTED   Desmethylcyclobenzaprine       PRESENT      EXPECTED    Desmethylcyclobenzaprine is an expected metabolite of    cyclobenzaprine.    Duloxetine                     PRESENT      EXPECTED   Trazodone                      PRESENT      EXPECTED   1,3 chlorophenyl piperazine    PRESENT      EXPECTED    1,3-chlorophenyl piperazine is an expected metabolite of trazodone.  Drug Present not Declared for Prescription Verification   Salicylate                     PRESENT      UNEXPECTED  Drug Absent but Declared for Prescription Verification   Sertraline                     Not Detected UNEXPECTED ==================================================================== Test                      Result    Flag   Units      Ref Range   Creatinine              42               mg/dL      >=84 ==================================================================== Declared Medications:  The flagging and interpretation on this report are based on the  following declared medications.  Unexpected results may arise from  inaccuracies in the declared medications.   **Note: The testing scope of this panel includes these medications:   Cyclobenzaprine  Duloxetine  Pregabalin  Sertraline  Topiramate  Tramadol  Trazodone   **Note: The testing scope of this panel does not include the  following reported medications:    Cetirizine  Fluticasone  Levothyroxine  Pantoprazole  Sumatriptan ==================================================================== For clinical consultation, please call [phone]. ====================================================================       ROS  Constitutional: Denies any fever or chills Gastrointestinal:  No reported hemesis, hematochezia, vomiting, or acute GI distress Musculoskeletal:  bilateral plantar foot pain Neurological: No reported episodes of acute onset apraxia, aphasia, dysarthria, agnosia, amnesia, paralysis, loss of coordination, or loss of consciousness  Medication Review  ARIPiprazole, DULoxetine, SUMAtriptan, levothyroxine, oxybutynin, pantoprazole, topiramate, traMADol, and traZODone  History Review  Allergy: [name] is allergic to penicillins, nsaids, gabapentin, pregabalin, and sulfa antibiotics. Drug: [name]  reports no history of drug use. Alcohol:  reports that she does not currently use alcohol. Tobacco:  reports that she has never smoked. She has never used smokeless tobacco. Social: [name]  reports that she has never smoked. She has never used smokeless tobacco. She reports that she does not currently use alcohol. She reports that she does not use drugs. Medical:  has a past medical history of Acquired pyloric stricture, Bleeding duodenal ulcer, CAP (community acquired pneumonia) (02/12/2020), Chronic esophagogastric ulcer, Depression, Duodenal obstruction, Gastric outlet obstruction (01/19/2018), GERD (gastroesophageal reflux disease), Hair loss (09/22/2018), History of stomach ulcers, Hypothyroidism, Incisional hernia, without obstruction or gangrene, Intractable vomiting, Kidney stones, Melena (05/08/2021), Migraine, Migraine headache, Neuropathy, Side pain (03/30/2018), and Upper GI bleed (05/18/2021). Surgical: [name]  has a past surgical history that includes Abdominal hysterectomy (2004); Cholecystectomy; Tooth extraction;  Esophagogastroduodenoscopy (egd) with propofol (N/A, 01/12/2018); laparotomy (N/A, 01/20/2018); Esophagogastroduodenoscopy (egd) with propofol (N/A, 01/25/2018); Colonoscopy with propofol (N/A, 02/27/2018); Esophagogastroduodenoscopy (egd) with propofol (N/A, 02/27/2018); Incisional hernia repair (N/A, 11/23/2018); Insertion of mesh (N/A, 11/23/2018); Esophagogastroduodenoscopy (egd) with propofol (N/A, 05/09/2021); and Esophagogastroduodenoscopy (egd) with propofol (N/A, 03/09/2022). Family: family history includes Alcohol abuse in her maternal grandmother and sister; Bipolar disorder in her sister; Breast cancer (age of onset: 12) in her mother; Cancer in her mother; Drug abuse in her sister; Lung cancer in her father; Stroke in her maternal grandmother and paternal grandmother.  Laboratory Chemistry Profile   Renal Lab Results  Component Value Date   BUN 16 11/23/2021   CREATININE 0.92 11/23/2021   BCR 23 02/09/2019   GFR 69.29 11/23/2021   GFRAA 82 02/09/2019   GFRNONAA >60 09/21/2021    Hepatic Lab Results  Component Value Date   AST 13 11/23/2021   ALT 10 11/23/2021   ALBUMIN 3.7 11/23/2021   ALKPHOS 68 11/23/2021   LIPASE 40 01/19/2018    Electrolytes Lab Results  Component Value Date   NA 143 11/23/2021   K 3.8 11/23/2021   CL 110 11/23/2021   CALCIUM 8.6 11/23/2021   MG 1.8 01/26/2018   PHOS 4.8 (H) 01/26/2018    Bone Lab Results  Component Value Date   VD25OH 38.39 11/23/2021    Inflammation (CRP: Acute Phase) (ESR: Chronic Phase) No results found for: "CRP", "ESRSEDRATE", "LATICACIDVEN"       Note: Above Lab results reviewed.  Recent Imaging Review  MM 3D SCREENING MAMMOGRAM BILATERAL BREAST CLINICAL DATA:  Screening.  EXAM: DIGITAL SCREENING BILATERAL MAMMOGRAM WITH TOMOSYNTHESIS AND CAD  TECHNIQUE: Bilateral screening digital craniocaudal and mediolateral oblique mammograms were obtained. Bilateral screening digital breast tomosynthesis was performed. The  images were evaluated with computer-aided detection.  COMPARISON:  Previous exam(s).  ACR Breast Density Category c: The breasts are heterogeneously dense, which may obscure small masses.  FINDINGS: There are no findings suspicious for malignancy.  IMPRESSION: No mammographic evidence of malignancy. A result letter of this screening mammogram will be mailed directly to the patient.  RECOMMENDATION: Screening mammogram in one year. (Code:SM-B-01Y)  BI-RADS CATEGORY  1: Negative.  Electronically Signed   By: Norwood Levo  Arceo M.D.   On: 08/04/2022 16:09 Note: Reviewed        Physical Exam  General appearance: Well nourished, well developed, and well hydrated. In no apparent acute distress Mental status: Alert, oriented x 3 (person, place, & time)       Respiratory: No evidence of acute respiratory distress Eyes: PERLA Vitals: BP 113/65   Pulse 94   Temp 98.7 F (37.1 C) (Temporal)   Resp 16   Ht 4\' 7"  (1.397 m)   Wt 106 lb 11.2 oz (48.4 kg) Comment: actual weight - weighed in clinic  SpO2 98%   BMI 24.80 kg/m  BMI: Estimated body mass index is 24.8 kg/m as calculated from the following:   Height as of this encounter: 4\' 7"  (1.397 m).   Weight as of this encounter: 106 lb 11.2 oz (48.4 kg). Ideal: Patient must be at least 60 in tall to calculate ideal body weight  Bilateral plantar fascitis  Assessment   Diagnosis Status  1. Plantar fascial fibromatosis of both feet   2. Idiopathic peripheral neuropathy   3. Chronic pain syndrome   4. Pain management contract signed    Having a Flare-up Controlled Controlled    Plan of Care   [name] has a current medication list which includes the following long-term medication(s): aripiprazole, duloxetine, duloxetine, levothyroxine, pantoprazole, sumatriptan, topiramate, topiramate, and trazodone.  Pharmacotherapy (Medications Ordered): Meds ordered this encounter  Medications   traMADol (ULTRAM) 50 MG tablet     Sig: Take 1 tablet (50 mg total) by mouth every 12 (twelve) hours as needed.    Dispense:  60 tablet    Refill:  1   Orders:  Orders Placed This Encounter  Procedures   Injection tendon or ligament    Standing Status:   Future    Standing Expiration Date:   02/17/2023    Scheduling Instructions:     Bilateral plantar fascia injection   ToxASSURE Select 13 (MW), Urine    Volume: 30 ml(s). Minimum 3 ml of urine is needed. Document temperature of fresh sample. Indications: Long term (current) use of opiate analgesic (Z61.096)    Order Specific Question:   Release to patient    Answer:   Immediate   Follow-up plan:   Return in about 2 weeks (around 12/02/2022) for B/L plantar fascia injections.      Bilateral plantar fascia injection 07/28/2022     Recent Visits No visits were found meeting these conditions. Showing recent visits within past 90 days and meeting all other requirements Today's Visits Date Type Provider Dept  11/18/22 Office Visit Edward Jolly, MD [facility]  Showing today's visits and meeting all other requirements Future Appointments No visits were found meeting these conditions. Showing future appointments within next 90 days and meeting all other requirements  I discussed the assessment and treatment plan with the patient. The patient was provided an opportunity to ask questions and all were answered. The patient agreed with the plan and demonstrated an understanding of the instructions.  Patient advised to call back or seek an in-person evaluation if the symptoms or condition worsens.  Duration of encounter: .  Total time on encounter, as per AMA guidelines included both the face-to-face and non-face-to-face time personally spent by the physician and/or other qualified health care professional(s) on the day of the encounter (includes time in activities that require the physician or other qualified health care professional and does not  include time in activities normally performed by  clinical staff). Physician's time may include the following activities when performed: Preparing to see the patient (e.g., pre-charting review of records, searching for previously ordered imaging, lab work, and nerve conduction tests) Review of prior analgesic pharmacotherapies. Reviewing PMP Interpreting ordered tests (e.g., lab work, imaging, nerve conduction tests) Performing post-procedure evaluations, including interpretation of diagnostic procedures Obtaining and/or reviewing separately obtained history Performing a medically appropriate examination and/or evaluation Counseling and educating the patient/family/caregiver Ordering medications, tests, or procedures Referring and communicating with other health care professionals (when not separately reported) Documenting clinical information in the electronic or other health record Independently interpreting results (not separately reported) and communicating results to the patient/ family/caregiver Care coordination (not separately reported)  Note by: Edward Jolly, MD Date: 11/18/2022; Time: 1:12 PM

## 2022-11-22 ENCOUNTER — Other Ambulatory Visit: Payer: Self-pay | Admitting: Primary Care

## 2022-11-22 DIAGNOSIS — G43111 Migraine with aura, intractable, with status migrainosus: Secondary | ICD-10-CM

## 2022-11-23 ENCOUNTER — Other Ambulatory Visit: Payer: Self-pay | Admitting: Primary Care

## 2022-11-23 ENCOUNTER — Telehealth: Payer: Self-pay | Admitting: Primary Care

## 2022-11-23 ENCOUNTER — Other Ambulatory Visit: Payer: Self-pay | Admitting: Psychiatry

## 2022-11-23 DIAGNOSIS — M722 Plantar fascial fibromatosis: Secondary | ICD-10-CM

## 2022-11-23 DIAGNOSIS — G609 Hereditary and idiopathic neuropathy, unspecified: Secondary | ICD-10-CM

## 2022-11-23 NOTE — Telephone Encounter (Signed)
Medication was sent in yesterday to requested pharmacy.  Left voicemail advising patient.

## 2022-11-23 NOTE — Telephone Encounter (Signed)
Prescription Request  11/23/2022  LOV: 08/19/2022  What is the name of the medication or equipment? SUMAtriptan (IMITREX) 50 MG tablet   Have you contacted your pharmacy to request a refill? No   Which pharmacy would you like this sent to?  [facility] [address], [place] - [address] [place] [address] Phone: [phone] Fax: [phone]    Patient notified that their request is being sent to the clinical staff for review and that they should receive a response within 2 business days.   Please advise at Mobile [phone] (mobile)  Pt wanted to let Chestine Spore know she develops headaches whenever she takes the meds, topiramate (TOPAMAX) 25 MG tablet

## 2022-11-24 NOTE — Telephone Encounter (Signed)
Pt called stating [facility] told her they haven't received any rx for IMITREX. Pt asked could rx be resent? Call back # [phone]

## 2022-11-24 NOTE — Telephone Encounter (Signed)
Called pharmacy, they verified they have the prescription and will get it ready for patient. Patient has been notified.

## 2022-12-22 ENCOUNTER — Encounter: Payer: Medicare HMO | Admitting: Primary Care

## 2022-12-24 ENCOUNTER — Telehealth: Payer: Self-pay | Admitting: *Deleted

## 2022-12-24 ENCOUNTER — Ambulatory Visit (INDEPENDENT_AMBULATORY_CARE_PROVIDER_SITE_OTHER): Payer: Medicare HMO | Admitting: Family Medicine

## 2022-12-24 ENCOUNTER — Telehealth: Payer: Self-pay | Admitting: Primary Care

## 2022-12-24 ENCOUNTER — Other Ambulatory Visit: Payer: Self-pay | Admitting: Primary Care

## 2022-12-24 ENCOUNTER — Encounter: Payer: Self-pay | Admitting: Family Medicine

## 2022-12-24 VITALS — BP 94/56 | HR 92 | Temp 98.2°F | Ht <= 58 in | Wt 98.4 lb

## 2022-12-24 DIAGNOSIS — G43111 Migraine with aura, intractable, with status migrainosus: Secondary | ICD-10-CM

## 2022-12-24 DIAGNOSIS — S0990XA Unspecified injury of head, initial encounter: Secondary | ICD-10-CM | POA: Diagnosis not present

## 2022-12-24 DIAGNOSIS — Z23 Encounter for immunization: Secondary | ICD-10-CM | POA: Diagnosis not present

## 2022-12-24 HISTORY — DX: Unspecified injury of head, initial encounter: S09.90XA

## 2022-12-24 NOTE — Telephone Encounter (Signed)
I spoke with pt; on 12/23/22 pt lost balance x 2 and fell and hit head x 2. Pt said she did not lose consciousness but took her few mins to get up. No H/A but today pt is still lightheaded and pts husband helped her to restroom earlier this morning. Pt does not have way to ck BP. Pt does not want  to go to UC or ED. Pt scheduled appt with Dr Milinda Antis on 12/24/22 at 12:30 pts husband will drive pt to appt. UC and ED precautions given and pt voiced understanding. Sending note to Dr Milinda Antis and [employer].

## 2022-12-24 NOTE — Telephone Encounter (Signed)
FYI: This call has been transferred to Access Nurse. Once the result note has been entered staff can address the message at that time.  Patient called in with the following symptoms:  Red Word:fall  Patient called in and stated that she loss balance and fell. She stated she fell two times and hit her head on the dresser and have some right side head pain.   Please advise at Mobile [phone] (mobile)  Message is routed to Provider Pool and [facility] Triage

## 2022-12-24 NOTE — Telephone Encounter (Signed)
error 

## 2022-12-24 NOTE — Progress Notes (Signed)
Subjective:    Patient ID: Tanya Nguyen, female    DOB: 1965-06-10, 57 y.o.   MRN: [id number]  HPI  Wt Readings from Last 3 Encounters:  12/24/22 98 lb 6 oz (44.6 kg)  11/18/22 106 lb 11.2 oz (48.4 kg)  08/19/22 110 lb (49.9 kg)   22.86 kg/m  Vitals:   12/24/22 1209  BP: (!) 94/56  Pulse: 92  Temp: 98.2 F (36.8 C)  SpO2: 96%    57 yo pt of NP Clark presents for a head injury   Lost balance yesterday twice yesterday and hit  head  First time turned around in bedroom  Hit the dresser with head decently hard  Made head sore -on the right  No bleeding  A little lump  No LOC    2nd time  Turning around  Still in bedroom  Body hit floor / head bounced but did not contact anything    Has been weak lately   No etoh  No illicit drugs Drinks lot of fluids   Not much appetite recently and eating less  Gastric bypass in the past  Weight is down   Does not eat 3 times per day /more like 2    No headache  Not dizzy  Just weak feeling all over  No change in vision  No change in hearing or ear ringing  No change in concentration    Feels entirely back to normal today   BP Readings from Last 3 Encounters:  12/24/22 (!) 94/56  11/18/22 113/65  08/19/22 (!) 94/58      Sees psychiatrist  Not sleeping well    Lab Results  Component Value Date   WBC 3.8 (L) 11/23/2021   HGB 11.4 (L) 11/23/2021   HCT 35.5 (L) 11/23/2021   MCV 87.2 11/23/2021   PLT 271.0 11/23/2021       Patient Active Problem List   Diagnosis Date Noted   Head injury 12/24/2022   Abnormal CT scan, gastrointestinal tract 03/09/2022   Prediabetes 11/23/2021   Preventative health care 11/23/2021   Right-sided chest pain 09/17/2021   Rib pain on right side 09/17/2021   Pain management contract signed 09/17/2021   Nodule of middle lobe of right lung 09/17/2021   Muscle spasm 09/17/2021   Anxiety 09/02/2021   Iron deficiency anemia 05/18/2021   Hx of bypass gastrojejunostomy  05/08/2021   Bilateral foot pain 03/13/2020   Chronic pain syndrome 03/13/2020   Idiopathic peripheral neuropathy 07/23/2019   Sacroiliac joint pain 08/14/2018   Urge incontinence 08/11/2018   Protein-calorie malnutrition, severe 01/20/2018   Vitamin D deficiency 07/28/2017   Vitamin B 12 deficiency 07/28/2017   Chronic low back pain 07/28/2017   Plantar fascial fibromatosis of both feet 11/10/2016   NSAID long-term use 09/16/2016   Bipolar I disorder, most recent episode depressed (HCC) 03/01/2015   PTSD (post-traumatic stress disorder) 02/27/2015   Constipation 04/02/2014   Fatigue 12/13/2013   Insomnia 08/30/2013   GERD (gastroesophageal reflux disease)    Migraines    Hypothyroidism    Depression    Past Medical History:  Diagnosis Date   Acquired pyloric stricture    Bleeding duodenal ulcer    CAP (community acquired pneumonia) 02/12/2020   Chronic esophagogastric ulcer    Depression    Duodenal obstruction    Gastric outlet obstruction 01/19/2018   GERD (gastroesophageal reflux disease)    Hair loss 09/22/2018   History of stomach ulcers    Hypothyroidism  Incisional hernia, without obstruction or gangrene    Intractable vomiting    Kidney stones    Melena 05/08/2021   Migraine    Migraine headache    only1-2x/month since starting topamax   Neuropathy    Side pain 03/30/2018   Upper GI bleed 05/18/2021   Past Surgical History:  Procedure Laterality Date   ABDOMINAL HYSTERECTOMY  2004   partial   CHOLECYSTECTOMY     COLONOSCOPY WITH PROPOFOL N/A 02/27/2018   Procedure: COLONOSCOPY WITH PROPOFOL;  Surgeon: Midge Minium, MD;  Location: [facility];  Service: Endoscopy;  Laterality: N/A;   ESOPHAGOGASTRODUODENOSCOPY (EGD) WITH PROPOFOL N/A 01/12/2018   Procedure: ESOPHAGOGASTRODUODENOSCOPY (EGD) WITH BIOPSIES;  Surgeon: Midge Minium, MD;  Location: [facility];  Service: Endoscopy;  Laterality: N/A;   ESOPHAGOGASTRODUODENOSCOPY (EGD) WITH PROPOFOL  N/A 01/25/2018   Procedure: ESOPHAGOGASTRODUODENOSCOPY (EGD) WITH PROPOFOL;  Surgeon: Wyline Mood, MD;  Location: [facility];  Service: Gastroenterology;  Laterality: N/A;   ESOPHAGOGASTRODUODENOSCOPY (EGD) WITH PROPOFOL N/A 02/27/2018   Procedure: ESOPHAGOGASTRODUODENOSCOPY (EGD) WITH PROPOFOL;  Surgeon: Midge Minium, MD;  Location: [facility];  Service: Endoscopy;  Laterality: N/A;   ESOPHAGOGASTRODUODENOSCOPY (EGD) WITH PROPOFOL N/A 05/09/2021   Procedure: ESOPHAGOGASTRODUODENOSCOPY (EGD) WITH PROPOFOL;  Surgeon: Jaynie Collins, DO;  Location: [facility];  Service: Gastroenterology;  Laterality: N/A;   ESOPHAGOGASTRODUODENOSCOPY (EGD) WITH PROPOFOL N/A 03/09/2022   Procedure: ESOPHAGOGASTRODUODENOSCOPY (EGD) WITH PROPOFOL;  Surgeon: Midge Minium, MD;  Location: [facility] END[facility];  Service: Endoscopy;  Laterality: N/A;   INCISIONAL HERNIA REPAIR N/A 11/23/2018   Procedure: HERNIA REPAIR INCISIONAL;  Surgeon: Henrene Dodge, MD;  Location: [facility];  Service: General;  Laterality: N/A;   INSERTION OF MESH N/A 11/23/2018   Procedure: INSERTION OF MESH;  Surgeon: Henrene Dodge, MD;  Location: [facility];  Service: General;  Laterality: N/A;   LAPAROTOMY N/A 01/20/2018   Procedure: EXPLORATORY LAPAROTOMY;  Surgeon: Henrene Dodge, MD;  Location: [facility];  Service: General;  Laterality: N/A;   TOOTH EXTRACTION     Social History   Tobacco Use   Smoking status: Never   Smokeless tobacco: Never  Vaping Use   Vaping status: Never Used  Substance Use Topics   Alcohol use: Not Currently    Alcohol/week: 0.0 standard drinks of alcohol   Drug use: No   Family History  Problem Relation Age of Onset   Cancer Mother    Breast cancer Mother 30   Lung cancer Father    Drug abuse Sister    Alcohol abuse Sister    Bipolar disorder Sister    Alcohol abuse Maternal Grandmother    Stroke Maternal Grandmother    Stroke Paternal Grandmother    Allergies  Allergen Reactions    Penicillins Shortness Of Breath, Diarrhea and Nausea And Vomiting    Has patient had a PCN reaction causing immediate rash, facial/tongue/throat swelling, SOB or lightheadedness with hypotension: yes Has patient had a PCN reaction causing severe rash involving mucus membranes or skin necrosis: no Has patient had a PCN reaction that required hospitalization no Has patient had a PCN reaction occurring within the last 10 years: about 10 years If all of the above answers are "NO", then may proceed with Cephalosporin use.    Nsaids Other (See Comments)    Pt states she is not allergic  Other reaction(s): OTHER Pt states NSAIDS cause "internal bleeding"   Gabapentin Other (See Comments)    Hair Loss    Pregabalin     Tremors  Sulfa Antibiotics Nausea And Vomiting   Current Outpatient Medications on File Prior to Visit  Medication Sig Dispense Refill   ARIPiprazole (ABILIFY) 2 MG tablet Take 1 tablet (2 mg total) by mouth at bedtime. 90 tablet 0   DULoxetine (CYMBALTA) 30 MG capsule TAKE 1 CAPSULE (30 MG TOTAL) BY MOUTH DAILY. FOR ANXIETY AND PAIN. TAKE WITH 60 MG. 90 capsule 0   DULoxetine (CYMBALTA) 60 MG capsule TAKE 1 CAPSULE (60 MG TOTAL) BY MOUTH DAILY. FOR ANXIETY AND PAIN. 90 capsule 1   levothyroxine (SYNTHROID) 88 MCG tablet TAKE 1 TABLET BY MOUTH EVERY MORNING ON EMPTY STOMACH WITH WATER ONLY-NO FOOD OR OTHER MED FOR 90 tablet 2   oxybutynin (DITROPAN-XL) 5 MG 24 hr tablet TAKE 1 TABLET (5 MG TOTAL) BY MOUTH AT BEDTIME. FOR BLADDER INCONTINENCE. 90 tablet 2   pantoprazole (PROTONIX) 40 MG tablet TAKE 1 TABLET BY MOUTH EVERY DAY 90 tablet 0   SUMAtriptan (IMITREX) 50 MG tablet TAKE 1 TABLET AT MIGRAINE ONSET. MAY REPEAT IN 2 HOURS IF HEADACHE PERSISTS OR RECURS. 9 tablet 0   traMADol (ULTRAM) 50 MG tablet Take 1 tablet (50 mg total) by mouth every 12 (twelve) hours as needed. 60 tablet 1   traZODone (DESYREL) 50 MG tablet Take 3 tablets (150 mg total) by mouth at bedtime as  needed for sleep. For sleep. 270 tablet 0   No current facility-administered medications on file prior to visit.    Review of Systems  Constitutional:  Negative for activity change, appetite change, fatigue, fever and unexpected weight change.  HENT:  Negative for congestion, ear pain, rhinorrhea, sinus pressure and sore throat.   Eyes:  Negative for pain, redness and visual disturbance.  Respiratory:  Negative for cough, shortness of breath and wheezing.   Cardiovascular:  Negative for chest pain and palpitations.  Gastrointestinal:  Negative for abdominal pain, blood in stool, constipation and diarrhea.  Endocrine: Negative for polydipsia and polyuria.  Genitourinary:  Negative for dysuria, frequency and urgency.  Musculoskeletal:  Negative for arthralgias, back pain and myalgias.  Skin:  Negative for pallor and rash.  Allergic/Immunologic: Negative for environmental allergies.  Neurological:  Negative for dizziness, tremors, seizures, syncope, facial asymmetry, speech difficulty, weakness, light-headedness, numbness and headaches.       Bump on head is less sore today  Hematological:  Negative for adenopathy. Does not bruise/bleed easily.  Psychiatric/Behavioral:  Negative for decreased concentration and dysphoric mood. The patient is not nervous/anxious.        Objective:   Physical Exam Constitutional:      General: She is not in acute distress.    Appearance: Normal appearance. She is well-developed and normal weight. She is not ill-appearing or diaphoretic.     Comments: Slim Somewhat frail appearing  HENT:     Head: Normocephalic and atraumatic.     Comments: No battle's sign    Right Ear: Tympanic membrane, ear canal and external ear normal.     Left Ear: Tympanic membrane, ear canal and external ear normal.     Ears:     Comments: No hemotympanum     Nose: Nose normal.     Mouth/Throat:     Mouth: Mucous membranes are moist.     Pharynx: No oropharyngeal exudate.      Comments: Mouth is not dry Eyes:     General: No scleral icterus.       Right eye: No discharge.        Left  eye: No discharge.     Conjunctiva/sclera: Conjunctivae normal.     Pupils: Pupils are equal, round, and reactive to light.     Comments: No nystagmus  Neck:     Thyroid: No thyromegaly.     Vascular: No carotid bruit or JVD.     Trachea: No tracheal deviation.  Cardiovascular:     Rate and Rhythm: Normal rate and regular rhythm.     Heart sounds: Normal heart sounds. No murmur heard. Pulmonary:     Effort: Pulmonary effort is normal. No respiratory distress.     Breath sounds: Normal breath sounds. No wheezing or rales.  Abdominal:     General: Bowel sounds are normal. There is no distension.     Palpations: Abdomen is soft.  Musculoskeletal:        General: No tenderness.     Cervical back: Full passive range of motion without pain, normal range of motion and neck supple.     Right lower leg: No edema.     Left lower leg: No edema.  Lymphadenopathy:     Cervical: No cervical adenopathy.  Skin:    General: Skin is warm and dry.     Coloration: Skin is not pale.     Findings: No rash.  Neurological:     Mental Status: She is alert and oriented to person, place, and time.     Cranial Nerves: No cranial nerve deficit, dysarthria or facial asymmetry.     Sensory: Sensation is intact. No sensory deficit.     Motor: No weakness, tremor, abnormal muscle tone, seizure activity or pronator drift.     Coordination: Romberg sign negative. Coordination normal. Finger-Nose-Finger Test normal.     Gait: Gait and tandem walk normal.     Deep Tendon Reflexes: Reflexes are normal and symmetric.     Comments: No focal cerebellar signs   Psychiatric:        Behavior: Behavior normal.        Thought Content: Thought content normal.           Assessment & Plan:   Problem List Items Addressed This Visit       Other   Head injury - Primary    Hit head on dresser yesterday  after losing balance  Sore area/slight swelling but no concussion symptoms at all  Normal exam today /no neuro changes Discussed signs and symptoms of concussion and subdural to watch for   Will follow up with pcp soon   To prevent further loss of balance and falls recommend increase fluids ( has baseline low blood pressure)  More regular meals with protein even if small (discussed protein sources)   Discuss sedating medications with psychiatry if needed / also discuss decrease in appetite  Looks like she will have wellness visit and labs soon       Other Visit Diagnoses     Need for influenza vaccination       Relevant Orders   Flu vaccine trivalent PF, 6mos and older(Flulaval,Afluria,Fluarix,Fluzone) (Completed)

## 2022-12-24 NOTE — Patient Instructions (Addendum)
Aim for 3 meals per day ( or 6 mini meals per day) Protein with every meal   The following are examples of protein in diet  Meat  Fish  Eggs  Dairy products  Soy products  Oat milk  Almond milk Nuts and nut butters  Dried beans    For now  Watch for headache, dizziness, nausea, trouble concentrating or any other new symptoms   Let us know  If severe -go to the ER  Stay hydrated  Ear more regularly  Take your time changing position  Follow up with Jae Dire as planned

## 2022-12-24 NOTE — Telephone Encounter (Signed)
Agree with Er precautions  Will see her then

## 2022-12-24 NOTE — Assessment & Plan Note (Signed)
Hit head on dresser yesterday after losing balance  Sore area/slight swelling but no concussion symptoms at all  Normal exam today /no neuro changes Discussed signs and symptoms of concussion and subdural to watch for   Will follow up with pcp soon   To prevent further loss of balance and falls recommend increase fluids ( has baseline low blood pressure)  More regular meals with protein even if small (discussed protein sources)   Discuss sedating medications with psychiatry if needed / also discuss decrease in appetite  Looks like she will have wellness visit and labs soon

## 2022-12-29 ENCOUNTER — Ambulatory Visit: Payer: Medicare HMO | Admitting: Primary Care

## 2022-12-29 ENCOUNTER — Other Ambulatory Visit: Payer: Self-pay | Admitting: Psychiatry

## 2022-12-29 ENCOUNTER — Telehealth: Payer: Self-pay

## 2022-12-29 ENCOUNTER — Encounter: Payer: Self-pay | Admitting: Primary Care

## 2022-12-29 VITALS — BP 108/62 | HR 95 | Temp 97.3°F | Ht <= 58 in | Wt 100.0 lb

## 2022-12-29 DIAGNOSIS — N3941 Urge incontinence: Secondary | ICD-10-CM

## 2022-12-29 DIAGNOSIS — R7303 Prediabetes: Secondary | ICD-10-CM | POA: Diagnosis not present

## 2022-12-29 DIAGNOSIS — Z0000 Encounter for general adult medical examination without abnormal findings: Secondary | ICD-10-CM

## 2022-12-29 DIAGNOSIS — G43009 Migraine without aura, not intractable, without status migrainosus: Secondary | ICD-10-CM

## 2022-12-29 DIAGNOSIS — E559 Vitamin D deficiency, unspecified: Secondary | ICD-10-CM | POA: Diagnosis not present

## 2022-12-29 DIAGNOSIS — E538 Deficiency of other specified B group vitamins: Secondary | ICD-10-CM | POA: Diagnosis not present

## 2022-12-29 DIAGNOSIS — G609 Hereditary and idiopathic neuropathy, unspecified: Secondary | ICD-10-CM

## 2022-12-29 DIAGNOSIS — M722 Plantar fascial fibromatosis: Secondary | ICD-10-CM

## 2022-12-29 DIAGNOSIS — K219 Gastro-esophageal reflux disease without esophagitis: Secondary | ICD-10-CM

## 2022-12-29 DIAGNOSIS — E038 Other specified hypothyroidism: Secondary | ICD-10-CM

## 2022-12-29 DIAGNOSIS — G4700 Insomnia, unspecified: Secondary | ICD-10-CM

## 2022-12-29 DIAGNOSIS — F3130 Bipolar disorder, current episode depressed, mild or moderate severity, unspecified: Secondary | ICD-10-CM

## 2022-12-29 LAB — LIPID PANEL
Cholesterol: 152 mg/dL (ref 0–200)
HDL: 62.1 mg/dL (ref >39.00)
LDL Cholesterol: 79 mg/dL (ref 0–99)
NonHDL: 90.33
Total CHOL/HDL Ratio: 2
Triglycerides: 57 mg/dL (ref 0.0–149.0)
VLDL: 11.4 mg/dL (ref 0.0–40.0)

## 2022-12-29 LAB — COMPREHENSIVE METABOLIC PANEL
ALT: 15 U/L (ref 0–35)
AST: 17 U/L (ref 0–37)
Albumin: 3.2 g/dL — ABNORMAL LOW (ref 3.5–5.2)
Alkaline Phosphatase: 68 U/L (ref 39–117)
BUN: 14 mg/dL (ref 6–23)
CO2: 34 meq/L — ABNORMAL HIGH (ref 19–32)
Calcium: 8.9 mg/dL (ref 8.4–10.5)
Chloride: 99 meq/L (ref 96–112)
Creatinine, Ser: 1.14 mg/dL (ref 0.40–1.20)
GFR: 53.16 mL/min — ABNORMAL LOW (ref >60.00)
Glucose, Bld: 126 mg/dL — ABNORMAL HIGH (ref 70–99)
Potassium: 3.7 meq/L (ref 3.5–5.1)
Sodium: 142 meq/L (ref 135–145)
Total Bilirubin: 0.3 mg/dL (ref 0.2–1.2)
Total Protein: 5.2 g/dL — ABNORMAL LOW (ref 6.0–8.3)

## 2022-12-29 LAB — HEMOGLOBIN A1C: Hgb A1c MFr Bld: 6.1 % (ref 4.6–6.5)

## 2022-12-29 LAB — CBC
HCT: 45 % (ref 36.0–46.0)
Hemoglobin: 14.6 g/dL (ref 12.0–15.0)
MCHC: 32.4 g/dL (ref 30.0–36.0)
MCV: 91.6 fL (ref 78.0–100.0)
Platelets: 347 10*3/uL (ref 150.0–400.0)
RBC: 4.91 Mil/uL (ref 3.87–5.11)
RDW: 13.3 % (ref 11.5–15.5)
WBC: 6.8 10*3/uL (ref 4.0–10.5)

## 2022-12-29 LAB — VITAMIN D 25 HYDROXY (VIT D DEFICIENCY, FRACTURES): VITD: 35.23 ng/mL (ref 30.00–100.00)

## 2022-12-29 LAB — VITAMIN B12: Vitamin B-12: 959 pg/mL — ABNORMAL HIGH (ref 211–911)

## 2022-12-29 MED ORDER — TRAZODONE HCL 100 MG PO TABS
200.0000 mg | ORAL_TABLET | Freq: Every evening | ORAL | 0 refills | Status: DC | PRN
Start: 2022-12-29 — End: 2023-01-06

## 2022-12-29 NOTE — Patient Instructions (Signed)
Stop by the lab prior to leaving today. I will notify you of your results once received.   It was a pleasure to see you today!  

## 2022-12-29 NOTE — Assessment & Plan Note (Signed)
Controlled.  Continue pantoprazole 40 mg daily. Following with GI.

## 2022-12-29 NOTE — Telephone Encounter (Signed)
Noted, thanks. I've ordered trazodone 200 mg (advise her to take two tabs, as one tab is 100 mg) for one month for now.  Side effects including drowsiness. I'll discuss her sleep further during our next conversation. Thank you for bringing up the concern about her self-adjusting her medication.

## 2022-12-29 NOTE — Progress Notes (Signed)
Subjective:    Patient ID: Tanya Nguyen, female    DOB: 1965/05/13, 57 y.o.   MRN: [id number]  HPI  Tanya Nguyen is a very pleasant 57 y.o. female who presents today for complete physical and follow up of chronic conditions.  Immunizations: -Tetanus: Completed in 2016 -Influenza: Completed a few weeks ago -Shingles: Completed Shingrix series   Diet: Fair diet.  Exercise: No regular exercise.  Eye exam: Completed years ago Dental exam: Completes semi-annually    Pap Smear: Hysterectomy Mammogram: Completed in May 2024  Colonoscopy: Completed in 2019, due 2029  BP Readings from Last 3 Encounters:  12/29/22 108/62  12/24/22 (!) 94/56  11/18/22 113/65         Review of Systems  Constitutional:  Negative for unexpected weight change.  HENT:  Negative for rhinorrhea.   Respiratory:  Negative for cough and shortness of breath.   Cardiovascular:  Negative for chest pain.  Gastrointestinal:  Negative for constipation and diarrhea.  Genitourinary:  Negative for difficulty urinating and menstrual problem.  Musculoskeletal:  Negative for arthralgias.  Skin:  Negative for rash.  Allergic/Immunologic: Negative for environmental allergies.  Neurological:  Positive for numbness. Negative for dizziness and headaches.  Psychiatric/Behavioral:  The patient is not nervous/anxious.          Past Medical History:  Diagnosis Date   Acquired pyloric stricture    Bleeding duodenal ulcer    CAP (community acquired pneumonia) 02/12/2020   Chronic esophagogastric ulcer    Constipation 04/02/2014   Depression    Duodenal obstruction    Gastric outlet obstruction 01/19/2018   GERD (gastroesophageal reflux disease)    Hair loss 09/22/2018   Head injury 12/24/2022   History of stomach ulcers    Hypothyroidism    Incisional hernia, without obstruction or gangrene    Intractable vomiting    Kidney stones    Melena 05/08/2021   Migraine    Migraine headache    only1-2x/month  since starting topamax   Neuropathy    Nodule of middle lobe of right lung 09/17/2021   NSAID long-term use 09/16/2016   Right-sided chest pain 09/17/2021   Sacroiliac joint pain 08/14/2018   Side pain 03/30/2018   Upper GI bleed 05/18/2021    Social History   Socioeconomic History   Marital status: Married    Spouse name: Not on file   Number of children: 0   Years of education: 12   Highest education level: High school graduate  Occupational History   Occupation: Homemaker  Tobacco Use   Smoking status: Never   Smokeless tobacco: Never  Vaping Use   Vaping status: Never Used  Substance and Sexual Activity   Alcohol use: Not Currently    Alcohol/week: 0.0 standard drinks of alcohol   Drug use: No   Sexual activity: Not Currently    Birth control/protection: None  Other Topics Concern   Not on file  Social History Narrative   Lives at home with her husband.   Right-handed.   Rare caffeine.   Social Determinants of Health   Financial Resource Strain: Not on file  Food Insecurity: Not on file  Transportation Needs: Not on file  Physical Activity: Not on file  Stress: Not on file  Social Connections: Not on file  Intimate Partner Violence: Not on file    Past Surgical History:  Procedure Laterality Date   ABDOMINAL HYSTERECTOMY  2004   partial   CHOLECYSTECTOMY     COLONOSCOPY WITH PROPOFOL N/A  02/27/2018   Procedure: COLONOSCOPY WITH PROPOFOL;  Surgeon: Midge Minium, MD;  Location: [facility];  Service: Endoscopy;  Laterality: N/A;   ESOPHAGOGASTRODUODENOSCOPY (EGD) WITH PROPOFOL N/A 01/12/2018   Procedure: ESOPHAGOGASTRODUODENOSCOPY (EGD) WITH BIOPSIES;  Surgeon: Midge Minium, MD;  Location: [facility];  Service: Endoscopy;  Laterality: N/A;   ESOPHAGOGASTRODUODENOSCOPY (EGD) WITH PROPOFOL N/A 01/25/2018   Procedure: ESOPHAGOGASTRODUODENOSCOPY (EGD) WITH PROPOFOL;  Surgeon: Wyline Mood, MD;  Location: [facility] ENDOSCOPY;  Service:  Gastroenterology;  Laterality: N/A;   ESOPHAGOGASTRODUODENOSCOPY (EGD) WITH PROPOFOL N/A 02/27/2018   Procedure: ESOPHAGOGASTRODUODENOSCOPY (EGD) WITH PROPOFOL;  Surgeon: Midge Minium, MD;  Location: [facility];  Service: Endoscopy;  Laterality: N/A;   ESOPHAGOGASTRODUODENOSCOPY (EGD) WITH PROPOFOL N/A 05/09/2021   Procedure: ESOPHAGOGASTRODUODENOSCOPY (EGD) WITH PROPOFOL;  Surgeon: Jaynie Collins, DO;  Location: [facility] ENDOSCOPY;  Service: Gastroenterology;  Laterality: N/A;   ESOPHAGOGASTRODUODENOSCOPY (EGD) WITH PROPOFOL N/A 03/09/2022   Procedure: ESOPHAGOGASTRODUODENOSCOPY (EGD) WITH PROPOFOL;  Surgeon: Midge Minium, MD;  Location: [facility] ENDOSCOPY;  Service: Endoscopy;  Laterality: N/A;   INCISIONAL HERNIA REPAIR N/A 11/23/2018   Procedure: HERNIA REPAIR INCISIONAL;  Surgeon: Henrene Dodge, MD;  Location: [facility] ORS;  Service: General;  Laterality: N/A;   INSERTION OF MESH N/A 11/23/2018   Procedure: INSERTION OF MESH;  Surgeon: Henrene Dodge, MD;  Location: [facility] OR[facility];  Service: General;  Laterality: N/A;   LAPAROTOMY N/A 01/20/2018   Procedure: EXPLORATORY LAPAROTOMY;  Surgeon: Henrene Dodge, MD;  Location: [facility] ORS;  Service: General;  Laterality: N/A;   TOOTH EXTRACTION      Family History  Problem Relation Age of Onset   Cancer Mother    Breast cancer Mother 28   Lung cancer Father    Drug abuse Sister    Alcohol abuse Sister    Bipolar disorder Sister    Alcohol abuse Maternal Grandmother    Stroke Maternal Grandmother    Stroke Paternal Grandmother     Allergies  Allergen Reactions   Penicillins Shortness Of Breath, Diarrhea and Nausea And Vomiting    Has patient had a PCN reaction causing immediate rash, facial/tongue/throat swelling, SOB or lightheadedness with hypotension: yes Has patient had a PCN reaction causing severe rash involving mucus membranes or skin necrosis: no Has patient had a PCN reaction that required hospitalization no Has patient had a PCN  reaction occurring within the last 10 years: about 10 years If all of the above answers are "NO", then may proceed with Cephalosporin use.    Nsaids Other (See Comments)    Pt states she is not allergic  Other reaction(s): OTHER Pt states NSAIDS cause "internal bleeding"   Gabapentin Other (See Comments)    Hair Loss    Pregabalin     Tremors   Sulfa Antibiotics Nausea And Vomiting    Current Outpatient Medications on File Prior to Visit  Medication Sig Dispense Refill   ARIPiprazole (ABILIFY) 2 MG tablet Take 1 tablet (2 mg total) by mouth at bedtime. 90 tablet 0   DULoxetine (CYMBALTA) 30 MG capsule TAKE 1 CAPSULE (30 MG TOTAL) BY MOUTH DAILY. FOR ANXIETY AND PAIN. TAKE WITH 60 MG. 90 capsule 0   DULoxetine (CYMBALTA) 60 MG capsule TAKE 1 CAPSULE (60 MG TOTAL) BY MOUTH DAILY. FOR ANXIETY AND PAIN. 90 capsule 1   levothyroxine (SYNTHROID) 88 MCG tablet TAKE 1 TABLET BY MOUTH EVERY MORNING ON EMPTY STOMACH WITH WATER ONLY-NO FOOD OR OTHER MED FOR 90 tablet 2   oxybutynin (DITROPAN-XL) 5 MG 24 hr tablet  TAKE 1 TABLET (5 MG TOTAL) BY MOUTH AT BEDTIME. FOR BLADDER INCONTINENCE. 90 tablet 2   pantoprazole (PROTONIX) 40 MG tablet TAKE 1 TABLET BY MOUTH EVERY DAY 90 tablet 0   SUMAtriptan (IMITREX) 50 MG tablet TAKE 1 TABLET AT MIGRAINE ONSET. MAY REPEAT IN 2 HOURS IF HEADACHE PERSISTS OR RECURS. 9 tablet 0   traMADol (ULTRAM) 50 MG tablet Take 1 tablet (50 mg total) by mouth every 12 (twelve) hours as needed. 60 tablet 1   traZODone (DESYREL) 50 MG tablet Take 3 tablets (150 mg total) by mouth at bedtime as needed for sleep. For sleep. 270 tablet 0   No current facility-administered medications on file prior to visit.    BP 108/62   Pulse 95   Temp (!) 97.3 F (36.3 C) (Temporal)   Ht 4\' 7"  (1.397 m)   Wt 100 lb (45.4 kg)   SpO2 96%   BMI 23.24 kg/m  Objective:   Physical Exam HENT:     Right Ear: Tympanic membrane and ear canal normal.     Left Ear: Tympanic membrane and  ear canal normal.  Eyes:     Pupils: Pupils are equal, round, and reactive to light.  Cardiovascular:     Rate and Rhythm: Normal rate and regular rhythm.  Pulmonary:     Effort: Pulmonary effort is normal.     Breath sounds: Normal breath sounds.  Abdominal:     General: Bowel sounds are normal.     Palpations: Abdomen is soft.     Tenderness: There is no abdominal tenderness.  Musculoskeletal:        General: Normal range of motion.     Cervical back: Neck supple.  Skin:    General: Skin is warm and dry.  Neurological:     Mental Status: She is alert and oriented to person, place, and time.     Cranial Nerves: No cranial nerve deficit.     Deep Tendon Reflexes:     Reflex Scores:      Patellar reflexes are 2+ on the right side and 2+ on the left side. Psychiatric:        Mood and Affect: Mood normal.           Assessment & Plan:  Preventative health care Assessment & Plan: Immunizations UTD.  Mammogram UTD Colonoscopy UTD, due 2029  Discussed the importance of a healthy diet and regular exercise in order for weight loss, and to reduce the risk of further co-morbidity.  Exam stable. Labs pending.  Follow up in 1 year for repeat physical.    Migraine without aura and without status migrainosus, not intractable Assessment & Plan: Stable.   Remain off Topamax.  Continue Imitrex 50 mg daily.    Gastroesophageal reflux disease, unspecified whether esophagitis present Assessment & Plan: Controlled.  Continue pantoprazole 40 mg daily. Following with GI.    Other specified hypothyroidism Assessment & Plan: She is taking levothyroxine correctly.  Continue levothyroxine 88 mcg daily. Repeat TSH pending.   Idiopathic peripheral neuropathy Assessment & Plan: Controlled. Following with pain management.  Continue Cymbalta 90 mg daily.   Plantar fascial fibromatosis of both feet Assessment & Plan: Controlled.  Following with pain management,  office notes reviewed from September 2024. Continue Cymbalta 90 mg daily, tramadol 50 mg as needed for which she uses sparingly.   Bipolar I disorder, most recent episode depressed (HCC) Assessment & Plan: Improved and controlled! Following with psychiatry, office notes reviewed from August 2024.  Continue Abilify 2 mg daily, duloxetine 90 mg daily, trazodone 150 mg at bedtime.   Insomnia, unspecified type Assessment & Plan: Controlled. Following with psychiatry.  Continue trazodone 150 mg at bedtime.   Prediabetes Assessment & Plan: Repeat A1c pending.  Orders: -     Lipid panel -     Hemoglobin A1c -     Comprehensive metabolic panel -     CBC  Urge incontinence Assessment & Plan: Controlled.  Continue oxybutynin XL 5 mg daily.   Vitamin B 12 deficiency Assessment & Plan: Repeat vitamin B-12 level pending.  Orders: -     Vitamin B12  Vitamin D deficiency Assessment & Plan: Repeat vitamin D level pending.  Orders: -     VITAMIN D 25 Hydroxy (Vit-D Deficiency, Fractures)        Doreene Nest, NP

## 2022-12-29 NOTE — Assessment & Plan Note (Signed)
Improved and controlled! Following with psychiatry, office notes reviewed from August 2024.  Continue Abilify 2 mg daily, duloxetine 90 mg daily, trazodone 150 mg at bedtime.

## 2022-12-29 NOTE — Assessment & Plan Note (Signed)
Immunizations UTD.  Mammogram UTD Colonoscopy UTD, due 2029  Discussed the importance of a healthy diet and regular exercise in order for weight loss, and to reduce the risk of further co-morbidity.  Exam stable. Labs pending.  Follow up in 1 year for repeat physical.

## 2022-12-29 NOTE — Telephone Encounter (Signed)
pt called states that she needs a refill on the trazodone. pt was told that she has an appt on 10-31 and that she should have enough until the begining of november. pt states that for the last few weeks she has been taking 4 pills instead of the 3 pills that was directed.  Pt states that she has not been sleeping so she took a extra to go to sleep.  Pt was told that she should not increase any medication without 1st contacting the provider.  Pt was explained that when she has a issues she needs to call provider because some medications can be at a max dosage and can be dangerous to increase on her own. She was encourage that no medication should be changed with out doctors consent.  She understood.  Pt was told that I would send dr. Vanetta Shawl a message that she (Tanya Nguyen) was still having issues sleeping and that you took a extra dosage.

## 2022-12-29 NOTE — Assessment & Plan Note (Signed)
Controlled. Following with psychiatry.  Continue trazodone 150 mg at bedtime.

## 2022-12-29 NOTE — Telephone Encounter (Signed)
pt was notified of the changes made to her medication. pt is to call office is she has any issues and she was made aware of her next appt

## 2022-12-29 NOTE — Assessment & Plan Note (Signed)
Controlled.  Continue oxybutynin XL 5 mg daily.

## 2022-12-29 NOTE — Assessment & Plan Note (Signed)
Controlled. Following with pain management.  Continue Cymbalta 90 mg daily.

## 2022-12-29 NOTE — Assessment & Plan Note (Signed)
Repeat vitamin B12 level pending. 

## 2022-12-29 NOTE — Assessment & Plan Note (Signed)
Controlled.  Following with pain management, office notes reviewed from September 2024. Continue Cymbalta 90 mg daily, tramadol 50 mg as needed for which she uses sparingly.

## 2022-12-29 NOTE — Assessment & Plan Note (Signed)
Repeat A1c pending. 

## 2022-12-29 NOTE — Assessment & Plan Note (Signed)
She is taking levothyroxine correctly. Continue levothyroxine 88 mcg daily. Repeat TSH pending. 

## 2022-12-29 NOTE — Assessment & Plan Note (Signed)
Repeat vitamin D level pending. 

## 2022-12-29 NOTE — Assessment & Plan Note (Signed)
Stable.   Remain off Topamax.  Continue Imitrex 50 mg daily.

## 2022-12-30 ENCOUNTER — Other Ambulatory Visit: Payer: Self-pay | Admitting: Primary Care

## 2022-12-30 DIAGNOSIS — N289 Disorder of kidney and ureter, unspecified: Secondary | ICD-10-CM
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
## 2023-01-06 ENCOUNTER — Encounter: Payer: Self-pay | Admitting: Psychiatry

## 2023-01-06 ENCOUNTER — Ambulatory Visit: Payer: Medicare HMO | Admitting: Psychiatry

## 2023-01-06 ENCOUNTER — Other Ambulatory Visit: Payer: Self-pay | Admitting: Psychiatry

## 2023-01-06 VITALS — BP 106/80 | HR 88 | Temp 97.5°F | Ht <= 58 in | Wt 99.0 lb

## 2023-01-06 DIAGNOSIS — F3342 Major depressive disorder, recurrent, in full remission: Secondary | ICD-10-CM

## 2023-01-06 DIAGNOSIS — G4700 Insomnia, unspecified: Secondary | ICD-10-CM | POA: Diagnosis not present

## 2023-01-06 DIAGNOSIS — F4310 Post-traumatic stress disorder, unspecified: Secondary | ICD-10-CM

## 2023-01-06 MED ORDER — DULOXETINE HCL 30 MG PO CPEP: 90.0000 mg | ORAL_CAPSULE | Freq: Every day | ORAL | 0 refills | Status: DC

## 2023-01-06 MED ORDER — DOXEPIN HCL 10 MG PO CAPS: 10.0000 mg | ORAL_CAPSULE | Freq: Every evening | ORAL | 1 refills | Status: DC | PRN

## 2023-01-06 NOTE — Patient Instructions (Signed)
Continue duloxetine 90 mg daily Continue Abilify 2 mg at night   Start doxepin 10 mg at night as needed for sleep  Decrease trazodone 100 mg at night for one week, then 50 mg at night for one week, then discontinue Next appointment:  1/2 at 1 PM

## 2023-01-07 ENCOUNTER — Telehealth: Payer: Self-pay

## 2023-01-07 NOTE — Telephone Encounter (Signed)
called pt insurance and they stated that patient never had a claim with them for the abilify.Tanya Nguyen

## 2023-01-07 NOTE — Telephone Encounter (Signed)
pt was called and given info. she states that you can not refill the duloxetine. she states she has not taken in 2 weeks and she feels fine. pt was told that she should have notified provider before stopping any medication. pt was told that on her next appt she needed to bring all her medications with her.

## 2023-01-07 NOTE — Telephone Encounter (Signed)
Noted. At her last visit, she was advised not to discontinue the medication as well.

## 2023-01-07 NOTE — Telephone Encounter (Signed)
called pharmacy and spoke with the pharmaist i told him that according to the patient insurance she never had a claim filed with the abilfy. he checked and stated that was correct and that they had used a goodrx card for it. i asked if they could use the goodrx card again.  He processed and it was approved and it cost $26.00

## 2023-01-07 NOTE — Telephone Encounter (Signed)
called pharmacy they state that the insurance will not pay for more than one pill on the duloxetine. so you need to send in a 60mg  and a 30mg , the abilify the patient cost is $100 for a 30 day supply and a 90 day supply is $300. they are working on the doxepin  I am going to call insurance and see why abilify cost so much.

## 2023-01-07 NOTE — Telephone Encounter (Signed)
pt called states that there is a issue with getting her medications at the pharmacy

## 2023-01-12 ENCOUNTER — Telehealth: Payer: Self-pay

## 2023-01-12 DIAGNOSIS — G4700 Insomnia, unspecified: Secondary | ICD-10-CM

## 2023-01-12 MED ORDER — HYDROXYZINE HCL 25 MG PO TABS: 12.5000 mg | ORAL_TABLET | Freq: Every evening | ORAL | 0 refills | Status: DC | PRN

## 2023-01-12 NOTE — Telephone Encounter (Signed)
notified pt and she said that she would try the medication. pt was told to check with her pharmacy later today.

## 2023-01-12 NOTE — Telephone Encounter (Signed)
pt states that she can not take the sleeping medication states that it gives her diarrhea. and that the trazodone gave er headaches. she want to know if she can take the zolpidem tartrate.

## 2023-01-12 NOTE — Telephone Encounter (Signed)
I will defer that question to Dr.Hisada who will be back in office Monday. However if she wants to try vistaril as needed for sleep until then , I can send a short supply to pharmacy. Other options are Melatonin , Melatonin combinations like sleep# 3, Qunol 5 in 1 sleep ( OTC) , she can get it at her pharmacy.

## 2023-01-12 NOTE — Telephone Encounter (Signed)
I have sent hydroxyzine 12.5-25 mg at bedtime as needed for sleep. Please provide patient education about side effects including dry mouth, constipation, sedation, falls, memory changes.  Patient is on other CNS depressant medications and hence there can be additive effect from the same.  Please advise patient to work on sleep hygiene.  Patient to discuss with Dr. Vanetta Shawl regarding further changes.

## 2023-01-14 ENCOUNTER — Other Ambulatory Visit (INDEPENDENT_AMBULATORY_CARE_PROVIDER_SITE_OTHER): Payer: Medicare HMO

## 2023-01-14 DIAGNOSIS — N289 Disorder of kidney and ureter, unspecified: Secondary | ICD-10-CM | POA: Diagnosis not present

## 2023-01-14 LAB — BASIC METABOLIC PANEL
BUN: 10 mg/dL (ref 6–23)
CO2: 32 meq/L (ref 19–32)
Calcium: 8.8 mg/dL (ref 8.4–10.5)
Chloride: 97 meq/L (ref 96–112)
Creatinine, Ser: 0.89 mg/dL (ref 0.40–1.20)
GFR: 71.52 mL/min (ref >60.00)
Glucose, Bld: 97 mg/dL (ref 70–99)
Potassium: 3.9 meq/L (ref 3.5–5.1)
Sodium: 137 meq/L (ref 135–145)

## 2023-01-14 NOTE — Telephone Encounter (Signed)
pt was notified and given instruction per dr. Elna Breslow order.

## 2023-01-18 ENCOUNTER — Encounter: Payer: Medicare HMO | Admitting: Student in an Organized Health Care Education/Training Program

## 2023-01-21 ENCOUNTER — Other Ambulatory Visit: Payer: Self-pay | Admitting: Primary Care

## 2023-01-21 DIAGNOSIS — R32 Unspecified urinary incontinence: Secondary | ICD-10-CM

## 2023-01-31 ENCOUNTER — Telehealth: Payer: Self-pay

## 2023-01-31 ENCOUNTER — Other Ambulatory Visit: Payer: Self-pay | Admitting: Psychiatry

## 2023-01-31 MED ORDER — ARIPIPRAZOLE 2 MG PO TABS: 2.0000 mg | ORAL_TABLET | Freq: Every day | ORAL | 0 refills | Status: DC

## 2023-01-31 NOTE — Telephone Encounter (Signed)
Ordered

## 2023-01-31 NOTE — Telephone Encounter (Signed)
Pt.notified

## 2023-01-31 NOTE — Telephone Encounter (Signed)
received fax requesting a 90 day supply of the aripirazole 2mg  pt was last seen on 10-31 next appt 1-2

## 2023-02-01 ENCOUNTER — Encounter: Payer: Self-pay | Admitting: Student in an Organized Health Care Education/Training Program

## 2023-02-01 ENCOUNTER — Ambulatory Visit
Payer: Medicare HMO | Attending: Student in an Organized Health Care Education/Training Program | Admitting: Student in an Organized Health Care Education/Training Program

## 2023-02-01 VITALS — BP 105/68 | HR 86 | Temp 98.8°F | Resp 16 | Ht 59.0 in | Wt 97.3 lb

## 2023-02-01 DIAGNOSIS — G609 Hereditary and idiopathic neuropathy, unspecified: Secondary | ICD-10-CM | POA: Insufficient documentation

## 2023-02-01 DIAGNOSIS — G894 Chronic pain syndrome: Secondary | ICD-10-CM | POA: Insufficient documentation

## 2023-02-01 DIAGNOSIS — M722 Plantar fascial fibromatosis: Secondary | ICD-10-CM | POA: Insufficient documentation

## 2023-02-01 MED ORDER — TRAMADOL HCL 50 MG PO TABS: 50.0000 mg | ORAL_TABLET | Freq: Two times a day (BID) | ORAL | 2 refills | Status: DC | PRN

## 2023-02-01 NOTE — Progress Notes (Signed)
PROVIDER NOTE: Information contained herein reflects review and annotations entered in association with encounter. Interpretation of such information and data should be left to medically-trained personnel. Information provided to patient can be located elsewhere in the medical record under "Patient Instructions". Document created using STT-dictation technology, any transcriptional errors that may result from process are unintentional.    Patient: Tanya Nguyen  Service Category: E/M  Provider: Edward Jolly, MD  DOB: 1965-12-13  DOS: 02/01/2023  Referring Provider: Doreene Nest, NP  MRN: [id number]  Specialty: Interventional Pain Management  PCP: Doreene Nest, NP  Type: Established Patient  Setting: Ambulatory outpatient    Location: Office  Delivery: Face-to-face     HPI  Tanya Nguyen, a 57 y.o. year old female, is here today because of her Idiopathic peripheral neuropathy [G60.9]. Tanya Nguyen primary complain today is Peripheral Neuropathy (Feet bilat)  Pertinent problems: Tanya Nguyen has Migraines; PTSD (post-traumatic stress disorder); Plantar fascial fibromatosis of both feet; Idiopathic peripheral neuropathy; Chronic pain syndrome; and Pain management contract signed on their pertinent problem list. Pain Assessment: Severity of Neuropathic pain is reported as a 7 /10. Location: Foot Right, Left/"if I stay on my feet a lot the pain goes into my shins". Onset: More than a month ago. Quality: Tingling, Numbness, Aching. Timing: Constant. Modifying factor(s): meds. Vitals:  height is 4\' 11"  (1.499 m) and weight is 97 lb 4.8 oz (44.1 kg). Her temporal temperature is 98.8 F (37.1 C). Her blood pressure is 105/68 and her pulse is 86. Her respiration is 16 and oxygen saturation is 100%.  BMI: Estimated body mass index is 19.65 kg/m as calculated from the following:   Height as of this encounter: 4\' 11"  (1.499 m).   Weight as of this encounter: 97 lb 4.8 oz (44.1 kg). Last encounter:  11/18/2022. Last procedure: 07/28/2022.  Reason for encounter:  Discussed the use of AI scribe software for clinical note transcription with the patient, who gave verbal consent to proceed.  History of Present Illness   The patient, with a history of chronic pain, reports no significant changes in her medical history since the last consultation. She indicates that her pain has been stable, with no recent visits to the emergency department or urgent care. The patient attributes this stability to her current medication regimen, which appears to effectively manage her symptoms, particularly in her feet.  The patient's sleep quality is reported as good, suggesting that her pain is well-controlled overnight. She denies any recent falls. Her current medication regimen includes twice-daily Tramadol, with a dose administered in the evening before bed. The patient confirms adherence to this regimen, with the most recent dose taken on the morning of the consultation.       Pharmacotherapy Assessment  Analgesic:  Tramadol 50 mg BID prn    Monitoring: [place] PMP: PDMP reviewed during this encounter.       Pharmacotherapy: No side-effects or adverse reactions reported. Compliance: No problems identified. Effectiveness: Clinically acceptable.  Nonah Mattes, RN  02/01/2023  9:11 AM  Sign when Signing Visit Nursing Pain Medication Assessment:  Safety precautions to be maintained throughout the outpatient stay will include: orient to surroundings, keep bed in low position, maintain call bell within reach at all times, provide assistance with transfer out of bed and ambulation.  Medication Inspection Compliance: Pill count conducted under aseptic conditions, in front of the patient. Neither the pills nor the bottle was removed from the patient's sight at any time. Once count was completed pills  were immediately returned to the patient in their original bottle.  Medication: Tramadol (Ultram) Pill/Patch Count:   12 of 60 pills remain Pill/Patch Appearance: Markings consistent with prescribed medication Bottle Appearance: Standard pharmacy container. Clearly labeled. Filled Date: 10 / 24 / 2025 Last Medication intake:  Today  No results found for: "CBDTHCR" No results found for: "D8THCCBX" No results found for: "D9THCCBX"  UDS:  Summary  Date Value Ref Range Status  03/13/2020 Note  Final    Comment:    ==================================================================== Compliance Drug Analysis, Ur ==================================================================== Test                             Result       Flag       Units  Drug Present and Declared for Prescription Verification   Tramadol                       >11905       EXPECTED   ng/mg creat   O-Desmethyltramadol            9229         EXPECTED   ng/mg creat   N-Desmethyltramadol            3952         EXPECTED   ng/mg creat    Source of tramadol is a prescription medication. O-desmethyltramadol    and N-desmethyltramadol are expected metabolites of tramadol.    Pregabalin                     PRESENT      EXPECTED   Topiramate                     PRESENT      EXPECTED   Cyclobenzaprine                PRESENT      EXPECTED   Desmethylcyclobenzaprine       PRESENT      EXPECTED    Desmethylcyclobenzaprine is an expected metabolite of    cyclobenzaprine.    Duloxetine                     PRESENT      EXPECTED   Trazodone                      PRESENT      EXPECTED   1,3 chlorophenyl piperazine    PRESENT      EXPECTED    1,3-chlorophenyl piperazine is an expected metabolite of trazodone.  Drug Present not Declared for Prescription Verification   Salicylate                     PRESENT      UNEXPECTED  Drug Absent but Declared for Prescription Verification   Sertraline                     Not Detected UNEXPECTED ==================================================================== Test                      Result    Flag    Units      Ref Range   Creatinine              42  mg/dL      >=16 ==================================================================== Declared Medications:  The flagging and interpretation on this report are based on the  following declared medications.  Unexpected results may arise from  inaccuracies in the declared medications.   **Note: The testing scope of this panel includes these medications:   Cyclobenzaprine  Duloxetine  Pregabalin  Sertraline  Topiramate  Tramadol  Trazodone   **Note: The testing scope of this panel does not include the  following reported medications:   Cetirizine  Fluticasone  Levothyroxine  Pantoprazole  Sumatriptan ==================================================================== For clinical consultation, please call [phone]. ====================================================================       ROS  Constitutional: Denies any fever or chills Gastrointestinal: No reported hemesis, hematochezia, vomiting, or acute GI distress Musculoskeletal: Denies any acute onset joint swelling, redness, loss of ROM, or weakness Neurological:  as above  Medication Review  ARIPiprazole, DULoxetine, SUMAtriptan, hydrOXYzine, levothyroxine, oxybutynin, pantoprazole, and traMADol  History Review  Allergy: Tanya Nguyen is allergic to penicillins, nsaids, gabapentin, pregabalin, and sulfa antibiotics. Drug: Tanya Nguyen  reports no history of drug use. Alcohol:  reports that she does not currently use alcohol. Tobacco:  reports that she has never smoked. She has never used smokeless tobacco. Social: Tanya Nguyen  reports that she has never smoked. She has never used smokeless tobacco. She reports that she does not currently use alcohol. She reports that she does not use drugs. Medical:  has a past medical history of Acquired pyloric stricture, Bleeding duodenal ulcer, CAP (community acquired pneumonia) (02/12/2020), Chronic esophagogastric  ulcer, Constipation (04/02/2014), Depression, Duodenal obstruction, Gastric outlet obstruction (01/19/2018), GERD (gastroesophageal reflux disease), Hair loss (09/22/2018), Head injury (12/24/2022), History of stomach ulcers, Hypothyroidism, Incisional hernia, without obstruction or gangrene, Intractable vomiting, Kidney stones, Melena (05/08/2021), Migraine, Migraine headache, Neuropathy, Nodule of middle lobe of right lung (09/17/2021), NSAID long-term use (09/16/2016), Right-sided chest pain (09/17/2021), Sacroiliac joint pain (08/14/2018), Side pain (03/30/2018), and Upper GI bleed (05/18/2021). Surgical: Tanya Nguyen  has a past surgical history that includes Abdominal hysterectomy (2004); Cholecystectomy; Tooth extraction; Esophagogastroduodenoscopy (egd) with propofol (N/A, 01/12/2018); laparotomy (N/A, 01/20/2018); Esophagogastroduodenoscopy (egd) with propofol (N/A, 01/25/2018); Colonoscopy with propofol (N/A, 02/27/2018); Esophagogastroduodenoscopy (egd) with propofol (N/A, 02/27/2018); Incisional hernia repair (N/A, 11/23/2018); Insertion of mesh (N/A, 11/23/2018); Esophagogastroduodenoscopy (egd) with propofol (N/A, 05/09/2021); and Esophagogastroduodenoscopy (egd) with propofol (N/A, 03/09/2022). Family: family history includes Alcohol abuse in her maternal grandmother and sister; Bipolar disorder in her sister; Breast cancer (age of onset: 63) in her mother; Cancer in her mother; Drug abuse in her sister; Lung cancer in her father; Stroke in her maternal grandmother and paternal grandmother.  Laboratory Chemistry Profile   Renal Lab Results  Component Value Date   BUN 10 01/14/2023   CREATININE 0.89 01/14/2023   BCR 23 02/09/2019   GFR 71.52 01/14/2023   GFRAA 82 02/09/2019   GFRNONAA >60 09/21/2021    Hepatic Lab Results  Component Value Date   AST 17 12/29/2022   ALT 15 12/29/2022   ALBUMIN 3.2 (L) 12/29/2022   ALKPHOS 68 12/29/2022   LIPASE 40 01/19/2018    Electrolytes Lab Results   Component Value Date   NA 137 01/14/2023   K 3.9 01/14/2023   CL 97 01/14/2023   CALCIUM 8.8 01/14/2023   MG 1.8 01/26/2018   PHOS 4.8 (H) 01/26/2018    Bone Lab Results  Component Value Date   VD25OH 35.23 12/29/2022    Inflammation (CRP: Acute Phase) (ESR: Chronic Phase) No results found for: "CRP", "ESRSEDRATE", "LATICACIDVEN"  Note: Above Lab results reviewed.  Recent Imaging Review  MM 3D SCREENING MAMMOGRAM BILATERAL BREAST CLINICAL DATA:  Screening.  EXAM: DIGITAL SCREENING BILATERAL MAMMOGRAM WITH TOMOSYNTHESIS AND CAD  TECHNIQUE: Bilateral screening digital craniocaudal and mediolateral oblique mammograms were obtained. Bilateral screening digital breast tomosynthesis was performed. The images were evaluated with computer-aided detection.  COMPARISON:  Previous exam(s).  ACR Breast Density Category c: The breasts are heterogeneously dense, which may obscure small masses.  FINDINGS: There are no findings suspicious for malignancy.  IMPRESSION: No mammographic evidence of malignancy. A result letter of this screening mammogram will be mailed directly to the patient.  RECOMMENDATION: Screening mammogram in one year. (Code:SM-B-01Y)  BI-RADS CATEGORY  1: Negative.  Electronically Signed   By: Baird Lyons M.D.   On: 08/04/2022 16:09 Note: Reviewed        Physical Exam  General appearance: Well nourished, well developed, and well hydrated. In no apparent acute distress Mental status: Alert, oriented x 3 (person, place, & time)       Respiratory: No evidence of acute respiratory distress Eyes: PERLA Vitals: BP 105/68   Pulse 86   Temp 98.8 F (37.1 C) (Temporal)   Resp 16   Ht 4\' 11"  (1.499 m)   Wt 97 lb 4.8 oz (44.1 kg)   SpO2 100%   BMI 19.65 kg/m  BMI: Estimated body mass index is 19.65 kg/m as calculated from the following:   Height as of this encounter: 4\' 11"  (1.499 m).   Weight as of this encounter: 97 lb 4.8 oz (44.1 kg). Ideal:  Female patients must weigh at least 45.5 kg to calculate ideal body weight  Assessment   Diagnosis Status  1. Idiopathic peripheral neuropathy   2. Plantar fascial fibromatosis of both feet   3. Chronic pain syndrome    Controlled Controlled Controlled   Updated Problems: No problems updated.  Plan of Care  Problem-specific:  Assessment and Plan    Chronic Pain   Chronic pain primarily affects her feet but is well-managed with tramadol taken twice daily, including in the evening, which alleviates symptoms. She has not experienced recent falls or emergency visits. We will continue tramadol twice daily and send a prescription for tramadol with two refills. An update of her urine specimen is needed today. Follow-up is scheduled for the end of February.       Tanya Nguyen has a current medication list which includes the following long-term medication(s): aripiprazole, duloxetine, duloxetine, duloxetine, levothyroxine, pantoprazole, and sumatriptan.  Pharmacotherapy (Medications Ordered): Meds ordered this encounter  Medications   traMADol (ULTRAM) 50 MG tablet    Sig: Take 1 tablet (50 mg total) by mouth every 12 (twelve) hours as needed.    Dispense:  60 tablet    Refill:  2   Orders:  Orders Placed This Encounter  Procedures   ToxASSURE Select 13 (MW), Urine    Volume: 30 ml(s). Minimum 3 ml of urine is needed. Document temperature of fresh sample. Indications: Long term (current) use of opiate analgesic (Z61.096)    Order Specific Question:   Release to patient    Answer:   Immediate   Follow-up plan:   Return in about 3 months (around 05/04/2023) for MM, F2F.      Bilateral plantar fascia injection 07/28/2022- repeat PRN    Recent Visits Date Type Provider Dept  11/18/22 Office Visit Edward Jolly, MD [facility]  Showing recent visits within past 90 days and meeting all other requirements  Today's Visits Date Type Provider Dept  02/01/23 Office  Visit Edward Jolly, MD [facility]  Showing today's visits and meeting all other requirements Future Appointments No visits were found meeting these conditions. Showing future appointments within next 90 days and meeting all other requirements  I discussed the assessment and treatment plan with the patient. The patient was provided an opportunity to ask questions and all were answered. The patient agreed with the plan and demonstrated an understanding of the instructions.  Patient advised to call back or seek an in-person evaluation if the symptoms or condition worsens.  Duration of encounter: .  Total time on encounter, as per AMA guidelines included both the face-to-face and non-face-to-face time personally spent by the physician and/or other qualified health care professional(s) on the day of the encounter (includes time in activities that require the physician or other qualified health care professional and does not include time in activities normally performed by clinical staff). Physician's time may include the following activities when performed: Preparing to see the patient (e.g., pre-charting review of records, searching for previously ordered imaging, lab work, and nerve conduction tests) Review of prior analgesic pharmacotherapies. Reviewing PMP Interpreting ordered tests (e.g., lab work, imaging, nerve conduction tests) Performing post-procedure evaluations, including interpretation of diagnostic procedures Obtaining and/or reviewing separately obtained history Performing a medically appropriate examination and/or evaluation Counseling and educating the patient/family/caregiver Ordering medications, tests, or procedures Referring and communicating with other health care professionals (when not separately reported) Documenting clinical information in the electronic or other health record Independently interpreting results (not separately reported) and  communicating results to the patient/ family/caregiver Care coordination (not separately reported)  Note by: Edward Jolly, MD Date: 02/01/2023; Time: 9:32 AM

## 2023-02-01 NOTE — Progress Notes (Signed)
Nursing Pain Medication Assessment:  Safety precautions to be maintained throughout the outpatient stay will include: orient to surroundings, keep bed in low position, maintain call bell within reach at all times, provide assistance with transfer out of bed and ambulation.  Medication Inspection Compliance: Pill count conducted under aseptic conditions, in front of the patient. Neither the pills nor the bottle was removed from the patient's sight at any time. Once count was completed pills were immediately returned to the patient in their original bottle.  Medication: Tramadol (Ultram) Pill/Patch Count:  12 of 60 pills remain Pill/Patch Appearance: Markings consistent with prescribed medication Bottle Appearance: Standard pharmacy container. Clearly labeled. Filled Date: 10 / 24 / 2025 Last Medication intake:  Today

## 2023-02-02 DIAGNOSIS — G894 Chronic pain syndrome: Secondary | ICD-10-CM | POA: Diagnosis not present

## 2023-02-02 DIAGNOSIS — G609 Hereditary and idiopathic neuropathy, unspecified: Secondary | ICD-10-CM | POA: Diagnosis not present

## 2023-02-06 LAB — TOXASSURE SELECT 13 (MW), URINE

## 2023-02-07 ENCOUNTER — Other Ambulatory Visit: Payer: Self-pay | Admitting: Psychiatry

## 2023-02-07 ENCOUNTER — Telehealth: Payer: Self-pay

## 2023-02-07 DIAGNOSIS — G4700 Insomnia, unspecified: Secondary | ICD-10-CM

## 2023-02-07 MED ORDER — HYDROXYZINE HCL 25 MG PO TABS: 12.5000 mg | ORAL_TABLET | Freq: Every evening | ORAL | 0 refills | Status: DC | PRN

## 2023-02-07 NOTE — Telephone Encounter (Signed)
Pt.notified

## 2023-02-07 NOTE — Telephone Encounter (Signed)
pt left a message that she needs a refill on the hydroxyzine. pt was last seen on 10-31 next appt 1-2

## 2023-02-07 NOTE — Telephone Encounter (Signed)
Ordered

## 2023-02-27 ENCOUNTER — Other Ambulatory Visit: Payer: Self-pay | Admitting: Primary Care

## 2023-02-27 DIAGNOSIS — E038 Other specified hypothyroidism: Secondary | ICD-10-CM

## 2023-02-28 ENCOUNTER — Other Ambulatory Visit: Payer: Self-pay | Admitting: Primary Care

## 2023-02-28 DIAGNOSIS — E038 Other specified hypothyroidism: Secondary | ICD-10-CM

## 2023-02-28 NOTE — Telephone Encounter (Signed)
Patient has been scheduled

## 2023-02-28 NOTE — Telephone Encounter (Signed)
Please call patient:  Can we have her come in for a lab only appointment to check on her thyroid function?

## 2023-03-01 ENCOUNTER — Other Ambulatory Visit: Payer: Self-pay | Admitting: Psychiatry

## 2023-03-01 DIAGNOSIS — G4700 Insomnia, unspecified: Secondary | ICD-10-CM

## 2023-03-03 ENCOUNTER — Telehealth: Payer: Self-pay

## 2023-03-03 DIAGNOSIS — G4700 Insomnia, unspecified: Secondary | ICD-10-CM

## 2023-03-03 MED ORDER — HYDROXYZINE HCL 25 MG PO TABS: 12.5000 mg | ORAL_TABLET | Freq: Every evening | ORAL | 0 refills | Status: AC | PRN

## 2023-03-03 NOTE — Addendum Note (Signed)
Addended by: Kathryne Sharper T on: 03/03/2023 03:26 PM   Modules accepted: Orders

## 2023-03-03 NOTE — Telephone Encounter (Signed)
Patient called requesting a refill for the following medication please advise     hydrOXYzine (ATARAX) 25 MG tablet    Last visit 01-06-23 Next visit 03-10-23  Preferred pharmacy [id number] [place], [place] - [address] Phone: [phone]  Fax: [phone]

## 2023-03-03 NOTE — Telephone Encounter (Signed)
Sent to [facility] in [place].  Please call the patient  and informed

## 2023-03-04 NOTE — Telephone Encounter (Signed)
Called patient to make aware that the Hydroxyzine 25 mg  had been sent to the pharmacy she voiced understanding
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
## 2023-03-08 ENCOUNTER — Other Ambulatory Visit: Payer: Medicare HMO

## 2023-03-08 ENCOUNTER — Other Ambulatory Visit: Payer: Self-pay

## 2023-03-08 DIAGNOSIS — G43111 Migraine with aura, intractable, with status migrainosus: Secondary | ICD-10-CM

## 2023-03-08 MED ORDER — SUMATRIPTAN SUCCINATE 50 MG PO TABS: ORAL_TABLET | ORAL | 0 refills | Status: DC

## 2023-03-08 NOTE — Telephone Encounter (Signed)
 Copied from CRM [id number]. Topic: Clinical - Medication Refill >> Mar 08, 2023  8:11 AM Leila C wrote: Most Recent Primary Care Visit:  Provider: [facility]  Department: [facility]  Visit Type: LAB  Date: 01/14/2023  Medication: SUMAtriptan  (IMITREX ) 50 MG tablet   Has the patient contacted their pharmacy? No (Agent: If no, request that the patient contact the pharmacy for the refill. If patient does not wish to contact the pharmacy document the reason why and proceed with request.) (Agent: If yes, when and what did the pharmacy advise?)  Is this the correct pharmacy for this prescription? Yes If no, delete pharmacy and type the correct one.  This is the patient's preferred pharmacy:  [facility] [address] [place] - [address] [place] [address] Phone: [phone] Fax: [phone]   Has the prescription been filled recently?   Is the patient out of the medication? Yes  Has the patient been seen for an appointment in the last year OR does the patient have an upcoming appointment?   Can we respond through MyChart? No, pls c/b [phone]  Agent: Please be advised that Rx refills may take up to 3 business days. We ask that you follow-up with your pharmacy.

## 2023-03-10 ENCOUNTER — Ambulatory Visit (INDEPENDENT_AMBULATORY_CARE_PROVIDER_SITE_OTHER): Payer: PPO | Admitting: Psychiatry

## 2023-03-10 ENCOUNTER — Encounter: Payer: Self-pay | Admitting: Psychiatry

## 2023-03-10 VITALS — BP 102/78 | HR 131 | Temp 98.0°F | Ht 59.0 in | Wt 86.0 lb

## 2023-03-10 DIAGNOSIS — F330 Major depressive disorder, recurrent, mild: Secondary | ICD-10-CM

## 2023-03-10 DIAGNOSIS — G4700 Insomnia, unspecified: Secondary | ICD-10-CM

## 2023-03-10 DIAGNOSIS — F4310 Post-traumatic stress disorder, unspecified: Secondary | ICD-10-CM | POA: Diagnosis not present

## 2023-03-10 MED ORDER — MIRTAZAPINE 15 MG PO TABS: ORAL_TABLET | ORAL | 1 refills | Status: DC

## 2023-03-10 MED ORDER — DOXEPIN HCL 10 MG PO CAPS: 10.0000 mg | ORAL_CAPSULE | Freq: Every evening | ORAL | 1 refills | Status: DC | PRN

## 2023-03-10 NOTE — Patient Instructions (Signed)
 Start mirtazapine 7.5 mg at night for one week, then 15 mg at night  Continue Abilify 2 mg at night   Start doxepin 10 mg at night as needed for sleep  Next appointment: 2/11 at 3:30

## 2023-03-11 ENCOUNTER — Other Ambulatory Visit (INDEPENDENT_AMBULATORY_CARE_PROVIDER_SITE_OTHER): Payer: PPO

## 2023-03-11 DIAGNOSIS — E038 Other specified hypothyroidism: Secondary | ICD-10-CM

## 2023-03-11 LAB — TSH: TSH: 0.07 u[IU]/mL — ABNORMAL LOW (ref 0.35–5.50)

## 2023-03-14 ENCOUNTER — Other Ambulatory Visit: Payer: Self-pay | Admitting: Primary Care

## 2023-03-14 DIAGNOSIS — E038 Other specified hypothyroidism: Secondary | ICD-10-CM

## 2023-03-15 ENCOUNTER — Telehealth: Payer: Self-pay

## 2023-03-15 NOTE — Telephone Encounter (Signed)
 Please advise her to hold off on taking mirtazapine  for now. I recall that she is experiencing other physical symptoms, including significant weight loss. Please advise her not to make any further medication changes for now and to see her primary care provider to address these physical symptoms.

## 2023-03-15 NOTE — Telephone Encounter (Signed)
 notified pt of dr. Vanetta Shawl orders and pt states that she is not taking the cymbalta and she is not taking the doxepin either. she states she sees her pcp on thursday.

## 2023-03-15 NOTE — Telephone Encounter (Signed)
 pt states she can not take the mirtazapine, she states it makes her vomit.  Pt was last seen on 1-2 next appt 2-11

## 2023-03-17 ENCOUNTER — Ambulatory Visit (INDEPENDENT_AMBULATORY_CARE_PROVIDER_SITE_OTHER): Payer: PPO | Admitting: Primary Care

## 2023-03-17 ENCOUNTER — Ambulatory Visit: Payer: Self-pay | Admitting: Primary Care

## 2023-03-17 VITALS — BP 100/58 | HR 121 | Temp 97.7°F | Wt 84.0 lb

## 2023-03-17 DIAGNOSIS — E038 Other specified hypothyroidism: Secondary | ICD-10-CM | POA: Diagnosis not present

## 2023-03-17 DIAGNOSIS — R634 Abnormal weight loss: Secondary | ICD-10-CM

## 2023-03-17 LAB — BASIC METABOLIC PANEL
BUN: 20 mg/dL (ref 6–23)
CO2: 25 meq/L (ref 19–32)
Calcium: 8.6 mg/dL (ref 8.4–10.5)
Chloride: 99 meq/L (ref 96–112)
Creatinine, Ser: 0.83 mg/dL (ref 0.40–1.20)
GFR: 77.68 mL/min (ref >60.00)
Glucose, Bld: 150 mg/dL — ABNORMAL HIGH (ref 70–99)
Potassium: 4.1 meq/L (ref 3.5–5.1)
Sodium: 136 meq/L (ref 135–145)

## 2023-03-17 LAB — CBC
HCT: 45.7 % (ref 36.0–46.0)
Hemoglobin: 15 g/dL (ref 12.0–15.0)
MCHC: 32.9 g/dL (ref 30.0–36.0)
MCV: 92 fL (ref 78.0–100.0)
Platelets: 491 10*3/uL — ABNORMAL HIGH (ref 150.0–400.0)
RBC: 4.97 Mil/uL (ref 3.87–5.11)
RDW: 13 % (ref 11.5–15.5)
WBC: 6.3 10*3/uL (ref 4.0–10.5)

## 2023-03-17 LAB — TSH: TSH: 0.04 u[IU]/mL — ABNORMAL LOW (ref 0.35–5.50)

## 2023-03-17 NOTE — Assessment & Plan Note (Signed)
 Recent TSH of 0.07 which is not her norm. Repeat TSH pending.  Continue levothyroxine 88 mcg daily, may need to reduce dose given her weight loss.

## 2023-03-17 NOTE — Patient Instructions (Signed)
 Stop by the lab prior to leaving today. I will notify you of your results once received.   You need to be eating 3 meals daily which should include a protein, vegetable, carb as discussed.  Add protein shakes or snacks in between meals.  Follow-up with your psychiatrist.

## 2023-03-17 NOTE — Assessment & Plan Note (Signed)
 Based on HPI and review of medical record her weight loss does not seem unintentional. She is hardly eating, over worried about her mother who is bedridden.  We discussed the absolute need to improve her diet, add snacks and protein shakes in between meals.  We discussed to eliminate soup at night, work on protein/carb/veggie.  Checking labs today to ensure no metabolic cause. Reviewed colonoscopy and mammogram for which are up-to-date.  Will defer her anxiety and decreased appetite back to her psychiatrist.

## 2023-03-17 NOTE — Telephone Encounter (Signed)
 Copied from CRM [id number]. Topic: Clinical - Pink Word Triage >> Mar 17, 2023 12:14 PM Montie POUR wrote: Reason for Triage: Tanya Nguyen saw Dr. Gretta this morning and Dr. Odette her to start eating more meats and veggie. Tanya Nguyen also told Val to drink something like ensure. Tanya Nguyen is not able to keep anything down. Tanya Nguyen is very weak and throwing up. Her husband is with her now so Tanya Nguyen is not alone. Her number is [phone]   Chief Complaint: Vomiting Symptoms: Vomiting Frequency: A week Pertinent Negatives: Patient denies abdominal pain, diarrhea, or any other symptoms.  Disposition: [] ED /[] Urgent Care (no appt availability in office) / [x] Appointment(In office/virtual)/ []  [facility]/ [] Home Care/ [] Refused Recommended Disposition /[] [facility]/ []  Follow-up with PCP Additional Notes: Tanya Nguyen is a 58 year old female being triaged for vomiting and weakness. The patient reports being fatigued and laying around al lot. The patient reports this has been going on for a week with no abdominal pain or a fever. Appointment made in office tomorrow for evaluation.   Reason for Disposition  [1] MILD or MODERATE vomiting AND [2] present > 48 hours (2 days) (Exception: Mild vomiting with associated diarrhea.)  Answer Assessment - Initial Assessment Questions 1. VOMITING SEVERITY: How many times have you vomited in the past 24 hours?     - MILD:  1 - 2 times/day    - MODERATE: 3 - 5 times/day, decreased oral intake without significant weight loss or symptoms of dehydration    - SEVERE: 6 or more times/day, vomits everything or nearly everything, with significant weight loss, symptoms of dehydration      1-2  2. ONSET: When did the vomiting begin?      A week  3. FLUIDS: What fluids or food have you vomited up today? Have you been able to keep any fluids down?     Unable to keep fluids down  4. ABDOMEN PAIN: Are your having any abdomen pain? If Yes : How bad is it and what does it  feel like? (e.g., crampy, dull, intermittent, constant)      No  5. DIARRHEA: Is there any diarrhea? If Yes, ask: How many times today?      No  6. CONTACTS: Is there anyone else in the family with the same symptoms?      ;No  7. CAUSE: What do you think is causing your vomiting?     [employer]  8. HYDRATION STATUS: Any signs of dehydration? (e.g., dry mouth [not only dry lips], too weak to stand) When did you last urinate?     Weakness  9. OTHER SYMPTOMS: Do you have any other symptoms? (e.g., fever, headache, vertigo, vomiting blood or coffee grounds, recent head injury)     Nausea, Weakness  10. PREGNANCY: Is there any chance you are pregnant? When was your last menstrual period?       No  Protocols used: Vomiting-A-AH

## 2023-03-17 NOTE — Telephone Encounter (Signed)
 Called and spoke with patient, she states when she left the office from her appt today she followed recommendation to eat more and got a chicken biscuit and had an Ensure. Shortly after she ate she had an episode of vomiting around 11:00. She noted feeling nauseous and weak. Patient has not had any more episode of vomiting since then. Patient states she was still feeling weak, asked the patient if this is something she just started experiencing with the N/V today, she stated no her overall weakness and fatigue has been ongoing for a week now and she attributed this to her thyroid . Discussed with patient we can keep her appt tomorrow with Dr. Avelina if she was still feeling unwell, patient states she has not had any more episodes of vomiting since 11:00 am today and would like to wait on the labs that were collected today after her visit. Canceled patients appt for tomorrow and advised patient if she experienced any more N/V to give the office a call back.

## 2023-03-17 NOTE — Telephone Encounter (Signed)
 Did we call and speak with her? What's going on?

## 2023-03-17 NOTE — Progress Notes (Signed)
 Subjective:    Patient ID: Tanya Nguyen, female    DOB: 1965-11-20, 58 y.o.   MRN: [id number]  HPI  Tanya Nguyen is a very pleasant 58 y.o. female with a history of GERD, hypothyroidism, bipolar 1 disorder, PTSD, urinary incontinence, chronic pain syndrome, iron  deficiency anemia, prediabetes, anxiety who presents today to discuss weight loss.  Today she discusses weight loss due to reduced appetite, is hardly eating much during the day. She doesn't feel like eating because of her constant worry regarding her mother. Her mother is in a nursing home, bed ridden, and in bad shape.   Her chronic nausea and vomiting have improved except for when she tried mirtazipine per her psychiatrist. She admits she stopped her duloxetine  about 2-3 months ago as she heard it gives you cancer. She never picked up the doxepin  from the pharmacy.   She denies abdominal pain, bloody stools, urinary frequency, dysuria.  She is moving her bowels well.  She has not taken for topiramate  for migraine prevention in numerous months.  Diet currently consists of:  Breakfast: Cereal, 1 egg, or tomato sandwich - small portions of all. Sometimes she skips.  Lunch: Skips Dinner: [employer], sometimes a piece of chicken with a small amount of vegetables.  Snacks: Rarely  Desserts: None. Beverages: Water , sweet tea  Exercise: None   Following with psychiatry, last office visit was 03/10/23.  During this visit she mentioned weight loss.   Her last mammogram was in May 2024, BI-RADS Category 1: Negative.  Her last colonoscopy was in 2019, internal hemorrhoids, otherwise negative, due for repeat colonoscopy in 2029.   She underwent CT chest in August 2023 which showed resolve of her prior right middle lobe nodularity.  She underwent CT abdomen/pelvis in July 2023 which showed no mass or significant abnormalities objectively.  History of hysterectomy.  TSH from 03/11/2023 with a level of 0.07 which was a decrease from 1.55  1-year prior.  She is taking levothyroxine  88 mcg every morning on empty stomach with water  only.  No food or other medications for at least 30 minutes.  Wt Readings from Last 3 Encounters:  03/17/23 84 lb (38.1 kg)  03/10/23 86 lb (39 kg)  02/01/23 97 lb 4.8 oz (44.1 kg)      Review of Systems  Constitutional:  Positive for fatigue. Negative for unexpected weight change.  Respiratory:  Negative for shortness of breath.   Cardiovascular:  Negative for chest pain.  Gastrointestinal:  Negative for abdominal pain, constipation, diarrhea, nausea and vomiting.  Genitourinary:  Negative for dysuria, frequency and urgency.  Neurological:  Negative for headaches.  Psychiatric/Behavioral:  The patient is nervous/anxious.          Past Medical History:  Diagnosis Date   Acquired pyloric stricture    Bleeding duodenal ulcer    CAP (community acquired pneumonia) 02/12/2020   Chronic esophagogastric ulcer    Constipation 04/02/2014   Depression    Duodenal obstruction    Gastric outlet obstruction 01/19/2018   GERD (gastroesophageal reflux disease)    Hair loss 09/22/2018   Head injury 12/24/2022   History of stomach ulcers    Hypothyroidism    Incisional hernia, without obstruction or gangrene    Intractable vomiting    Kidney stones    Melena 05/08/2021   Migraine    Migraine headache    only1-2x/month since starting topamax    Neuropathy    Nodule of middle lobe of right lung 09/17/2021   NSAID long-term use  09/16/2016   Right-sided chest pain 09/17/2021   Sacroiliac joint pain 08/14/2018   Side pain 03/30/2018   Upper GI bleed 05/18/2021    Social History   Socioeconomic History   Marital status: Married    Spouse name: Not on file   Number of children: 0   Years of education: 12   Highest education level: High school graduate  Occupational History   Occupation: Homemaker  Tobacco Use   Smoking status: Never   Smokeless tobacco: Never  Vaping Use   Vaping  status: Never Used  Substance and Sexual Activity   Alcohol use: Not Currently    Alcohol/week: 0.0 standard drinks of alcohol   Drug use: No   Sexual activity: Not Currently    Birth control/protection: None  Other Topics Concern   Not on file  Social History Narrative   Lives at home with her husband.   Right-handed.   Rare caffeine.   Social Drivers of Corporate Investment Banker Strain: Not on file  Food Insecurity: Not on file  Transportation Needs: Not on file  Physical Activity: Not on file  Stress: Not on file  Social Connections: Not on file  Intimate Partner Violence: Not on file    Past Surgical History:  Procedure Laterality Date   ABDOMINAL HYSTERECTOMY  2004   partial   CHOLECYSTECTOMY     COLONOSCOPY WITH PROPOFOL  N/A 02/27/2018   Procedure: COLONOSCOPY WITH PROPOFOL ;  Surgeon: Jinny Carmine, MD;  Location: [facility];  Service: Endoscopy;  Laterality: N/A;   ESOPHAGOGASTRODUODENOSCOPY (EGD) WITH PROPOFOL  N/A 01/12/2018   Procedure: ESOPHAGOGASTRODUODENOSCOPY (EGD) WITH BIOPSIES;  Surgeon: Jinny Carmine, MD;  Location: [facility];  Service: Endoscopy;  Laterality: N/A;   ESOPHAGOGASTRODUODENOSCOPY (EGD) WITH PROPOFOL  N/A 01/25/2018   Procedure: ESOPHAGOGASTRODUODENOSCOPY (EGD) WITH PROPOFOL ;  Surgeon: Therisa Bi, MD;  Location: [facility] ENDOSCOPY;  Service: Gastroenterology;  Laterality: N/A;   ESOPHAGOGASTRODUODENOSCOPY (EGD) WITH PROPOFOL  N/A 02/27/2018   Procedure: ESOPHAGOGASTRODUODENOSCOPY (EGD) WITH PROPOFOL ;  Surgeon: Jinny Carmine, MD;  Location: [facility];  Service: Endoscopy;  Laterality: N/A;   ESOPHAGOGASTRODUODENOSCOPY (EGD) WITH PROPOFOL  N/A 05/09/2021   Procedure: ESOPHAGOGASTRODUODENOSCOPY (EGD) WITH PROPOFOL ;  Surgeon: Onita Elspeth Sharper, DO;  Location: [facility];  Service: Gastroenterology;  Laterality: N/A;   ESOPHAGOGASTRODUODENOSCOPY (EGD) WITH PROPOFOL  N/A 03/09/2022   Procedure: ESOPHAGOGASTRODUODENOSCOPY (EGD)  WITH PROPOFOL ;  Surgeon: Jinny Carmine, MD;  Location: [facility] ENDOSCOPY;  Service: Endoscopy;  Laterality: N/A;   INCISIONAL HERNIA REPAIR N/A 11/23/2018   Procedure: HERNIA REPAIR INCISIONAL;  Surgeon: Desiderio Schanz, MD;  Location: [facility] ORS;  Service: General;  Laterality: N/A;   INSERTION OF MESH N/A 11/23/2018   Procedure: INSERTION OF MESH;  Surgeon: Desiderio Schanz, MD;  Location: [facility] ORS;  Service: General;  Laterality: N/A;   LAPAROTOMY N/A 01/20/2018   Procedure: EXPLORATORY LAPAROTOMY;  Surgeon: Desiderio Schanz, MD;  Location: [facility] ORS;  Service: General;  Laterality: N/A;   TOOTH EXTRACTION      Family History  Problem Relation Age of Onset   Cancer Mother    Breast cancer Mother 57   Lung cancer Father    Drug abuse Sister    Alcohol abuse Sister    Bipolar disorder Sister    Alcohol abuse Maternal Grandmother    Stroke Maternal Grandmother    Stroke Paternal Grandmother     Allergies  Allergen Reactions   Penicillins Shortness Of Breath, Diarrhea and Nausea And Vomiting    Has patient had a PCN reaction causing immediate rash,  facial/tongue/throat swelling, SOB or lightheadedness with hypotension: yes Has patient had a PCN reaction causing severe rash involving mucus membranes or skin necrosis: no Has patient had a PCN reaction that required hospitalization no Has patient had a PCN reaction occurring within the last 10 years: about 10 years If all of the above answers are NO, then may proceed with Cephalosporin use.    Nsaids Other (See Comments)    Pt states she is not allergic  Other reaction(s): OTHER Pt states NSAIDS cause internal bleeding   Gabapentin  Other (See Comments)    Hair Loss    Pregabalin      Tremors   Sulfa Antibiotics Nausea And Vomiting    Current Outpatient Medications on File Prior to Visit  Medication Sig Dispense Refill   ARIPiprazole  (ABILIFY ) 2 MG tablet Take 1 tablet (2 mg total) by mouth at bedtime. 90 tablet 0   hydrOXYzine  (ATARAX )  25 MG tablet Take 0.5-1 tablets (12.5-25 mg total) by mouth at bedtime as needed for anxiety. 30 tablet 0   levothyroxine  (SYNTHROID ) 88 MCG tablet TAKE 1 TABLET BY MOUTH EVERY MORNING ON EMPTY STOMACH WITH WATER  ONLY-NO FOOD OR OTHER MED FOR 90 tablet 0   oxybutynin  (DITROPAN -XL) 5 MG 24 hr tablet TAKE 1 TABLET (5 MG TOTAL) BY MOUTH AT BEDTIME. FOR BLADDER INCONTINENCE. 90 tablet 2   pantoprazole  (PROTONIX ) 40 MG tablet TAKE 1 TABLET BY MOUTH EVERY DAY 90 tablet 0   SUMAtriptan  (IMITREX ) 50 MG tablet Take 1 tablet at migraine onset. May repeat in 2 hours if headache persists or recurs. 9 tablet 0   traMADol  (ULTRAM ) 50 MG tablet Take 1 tablet (50 mg total) by mouth every 12 (twelve) hours as needed. 60 tablet 2   doxepin  (SINEQUAN ) 10 MG capsule Take 1 capsule (10 mg total) by mouth at bedtime as needed (insomnia). (Patient not taking: Reported on 03/17/2023) 30 capsule 1   DULoxetine  (CYMBALTA ) 30 MG capsule TAKE 1 CAPSULE (30 MG TOTAL) BY MOUTH DAILY. FOR ANXIETY AND PAIN. TAKE WITH 60 MG. (Patient not taking: Reported on 03/17/2023) 90 capsule 0   DULoxetine  (CYMBALTA ) 60 MG capsule TAKE 1 CAPSULE (60 MG TOTAL) BY MOUTH DAILY. FOR ANXIETY AND PAIN. (Patient not taking: Reported on 03/17/2023) 90 capsule 1   mirtazapine  (REMERON ) 15 MG tablet 7.5 mg at night for one week, then 15 mg at night (Patient not taking: Reported on 03/17/2023) 30 tablet 1   No current facility-administered medications on file prior to visit.    BP (!) 100/58   Pulse (!) 121   Temp 97.7 F (36.5 C) (Oral)   Wt 84 lb (38.1 kg)   SpO2 98%   BMI 16.97 kg/m  Objective:   Physical Exam Constitutional:      General: She is not in acute distress.    Appearance: She is not ill-appearing.  Cardiovascular:     Rate and Rhythm: Normal rate and regular rhythm.  Pulmonary:     Effort: Pulmonary effort is normal.     Breath sounds: Normal breath sounds.  Abdominal:     General: Bowel sounds are normal.     Palpations:  Abdomen is soft.     Tenderness: There is no abdominal tenderness.  Musculoskeletal:     Cervical back: Neck supple.  Skin:    General: Skin is warm and dry.  Neurological:     Mental Status: She is alert and oriented to person, place, and time.  Psychiatric:  Mood and Affect: Mood normal.           Assessment & Plan:  Weight loss Assessment & Plan: Based on HPI and review of medical record her weight loss does not seem unintentional. She is hardly eating, over worried about her mother who is bedridden.  We discussed the absolute need to improve her diet, add snacks and protein shakes in between meals.  We discussed to eliminate soup at night, work on protein/carb/veggie.  Checking labs today to ensure no metabolic cause. Reviewed colonoscopy and mammogram for which are up-to-date.  Will defer her anxiety and decreased appetite back to her psychiatrist.  Orders: -     CBC -     Basic metabolic panel -     CA 125  Other specified hypothyroidism Assessment & Plan: Recent TSH of 0.07 which is not her norm. Repeat TSH pending.  Continue levothyroxine  88 mcg daily, may need to reduce dose given her weight loss.  Orders: -     TSH        Comer MARLA Gaskins, NP

## 2023-03-18 ENCOUNTER — Ambulatory Visit: Payer: PPO | Admitting: Family Medicine

## 2023-03-18 ENCOUNTER — Other Ambulatory Visit: Payer: Self-pay | Admitting: Primary Care

## 2023-03-18 DIAGNOSIS — E038 Other specified hypothyroidism: Secondary | ICD-10-CM

## 2023-03-18 MED ORDER — LEVOTHYROXINE SODIUM 75 MCG PO TABS: ORAL_TABLET | ORAL | 0 refills | Status: DC

## 2023-03-18 NOTE — Telephone Encounter (Signed)
Noted, see result note  

## 2023-03-21 ENCOUNTER — Telehealth: Payer: Self-pay | Admitting: Primary Care

## 2023-03-21 LAB — CA 125: CA 125: 14 U/mL (ref <35)

## 2023-03-21 NOTE — Telephone Encounter (Signed)
 Copied from CRM [id number]. Topic: Referral - Request for Referral >> Mar 21, 2023  9:05 AM Robinson DEL wrote: Did the patient discuss referral with their provider in the last year? Yes (If No - schedule appointment) (If Yes - send message)  Appointment offered? No  Type of order/referral and detailed reason for visit: Gastro  Preference of office, provider, location: Dr. Meredith  If referral order, have you been seen by this specialty before? Yes, office told patient it's been over 3 years since she seen provider so she needs a referral from primary care (If Yes, this issue or another issue? When? Where?  Can we respond through MyChart? No

## 2023-03-21 NOTE — Telephone Encounter (Signed)
 She is an established patient of Dr. Servando Snare so she needs to call his office to schedule an appointment. Have her let us know if she has difficulty scheduling with his office.

## 2023-03-22 NOTE — Telephone Encounter (Signed)
 Called and spoke with patient, she stated had called and spoke with Dr. Geraldene office and they advised her she would need a new referral because she hasn't been seen in over 3 years. Advised patient per chart review her last appt with Dr. Jinny was 03/04/2022. She will give Gastro a callback to see if they will schedule her. Advised if she is still experiencing trouble getting scheduled to let us  know.

## 2023-03-28 ENCOUNTER — Other Ambulatory Visit: Payer: Medicare HMO

## 2023-03-28 NOTE — Progress Notes (Deleted)
Celso Amy, PA-C [address]  [address]  [place], [place] [address]  Main: [phone]  Fax: [phone]   Primary Care Physician: Doreene Nest, NP  Primary Gastroenterologist:  Celso Amy, PA-C / Dr. Midge Minium    CC: Dysphagia  HPI: Tanya Nguyen is a 58 y.o. female, estab. Pt. Of Dr. Servando Snare, presents for evaluation of dysphagia.  Hx of GERD.  Hx peptic ulcer disease requiring surgery in 2019.  History of Chronic dysphagia, abd pain, N/V.   Prior gastrojejunostomy and Jejunojejunostomy in 2019.  She has had Multiple EGDs.  She is having weight loss, down to 84 lbs.  03/17/23 Labs: Hgb 15.0, Plt 491.  Normal CA 125.  TSH 0.04 and levothyroxine dose was decreased.  Last EGD 03/09/22 by Dr. Servando Snare: Normal esophagus, evidence of gastrojejunostomy, foreign body in jejunum.  Bx negative for H. Pylori.  03/17/22: UGIS w/ SBFT:  1. Small sliding hiatal hernia. 2. Prior gastrojejunostomy and jejunojejunostomy. 3. Thickened appearance of the gastric folds, suggesting gastritis. 4. Dilation of proximal jejunal loops (measuring up to 7 cm in diameter). However, no significant delay in contrast transit to suggest a small bowel obstruction. 5. The terminal ileum was somewhat poorly visualized due to overlapping bowel loops (despite paddle compression). Within this limitation, otherwise unremarkable appearance of the small bowel.  02/2018: Colonoscopy: Fair Prep; Grade I Hemorrhoids, NO polyps.  10 year repeat screening.  04/2014 Colonoscopy: Good Prep.  No polyps.  Current Outpatient Medications  Medication Sig Dispense Refill   ARIPiprazole (ABILIFY) 2 MG tablet Take 1 tablet (2 mg total) by mouth at bedtime. 90 tablet 0   doxepin (SINEQUAN) 10 MG capsule Take 1 capsule (10 mg total) by mouth at bedtime as needed (insomnia). (Patient not taking: Reported on 03/17/2023) 30 capsule 1   DULoxetine (CYMBALTA) 30 MG capsule TAKE 1 CAPSULE (30 MG TOTAL) BY MOUTH DAILY. FOR ANXIETY  AND PAIN. TAKE WITH 60 MG. (Patient not taking: Reported on 03/17/2023) 90 capsule 0   DULoxetine (CYMBALTA) 60 MG capsule TAKE 1 CAPSULE (60 MG TOTAL) BY MOUTH DAILY. FOR ANXIETY AND PAIN. (Patient not taking: Reported on 03/17/2023) 90 capsule 1   hydrOXYzine (ATARAX) 25 MG tablet Take 0.5-1 tablets (12.5-25 mg total) by mouth at bedtime as needed for anxiety. 30 tablet 0   levothyroxine (SYNTHROID) 75 MCG tablet Take 1 tablet by mouth every morning on an empty stomach with water only.  No food or other medications for 30 minutes. 90 tablet 0   mirtazapine (REMERON) 15 MG tablet 7.5 mg at night for one week, then 15 mg at night (Patient not taking: Reported on 03/17/2023) 30 tablet 1   oxybutynin (DITROPAN-XL) 5 MG 24 hr tablet TAKE 1 TABLET (5 MG TOTAL) BY MOUTH AT BEDTIME. FOR BLADDER INCONTINENCE. 90 tablet 2   pantoprazole (PROTONIX) 40 MG tablet TAKE 1 TABLET BY MOUTH EVERY DAY 90 tablet 0   SUMAtriptan (IMITREX) 50 MG tablet Take 1 tablet at migraine onset. May repeat in 2 hours if headache persists or recurs. 9 tablet 0   traMADol (ULTRAM) 50 MG tablet Take 1 tablet (50 mg total) by mouth every 12 (twelve) hours as needed. 60 tablet 2   No current facility-administered medications for this visit.    Allergies as of 03/29/2023 - Review Complete 03/17/2023  Allergen Reaction Noted   Penicillins Shortness Of Breath, Diarrhea, and Nausea And Vomiting 02/26/2015   Nsaids Other (See Comments) 06/26/2012   Gabapentin Other (See Comments) 11/14/2018  Pregabalin  04/21/2020   Sulfa antibiotics Nausea And Vomiting 06/26/2012    Past Medical History:  Diagnosis Date   Acquired pyloric stricture    Bleeding duodenal ulcer    CAP (community acquired pneumonia) 02/12/2020   Chronic esophagogastric ulcer    Constipation 04/02/2014   Depression    Duodenal obstruction    Gastric outlet obstruction 01/19/2018   GERD (gastroesophageal reflux disease)    Hair loss 09/22/2018   Head injury  12/24/2022   History of stomach ulcers    Hypothyroidism    Incisional hernia, without obstruction or gangrene    Intractable vomiting    Kidney stones    Melena 05/08/2021   Migraine    Migraine headache    only1-2x/month since starting topamax   Neuropathy    Nodule of middle lobe of right lung 09/17/2021   NSAID long-term use 09/16/2016   Right-sided chest pain 09/17/2021   Sacroiliac joint pain 08/14/2018   Side pain 03/30/2018   Upper GI bleed 05/18/2021    Past Surgical History:  Procedure Laterality Date   ABDOMINAL HYSTERECTOMY  2004   partial   CHOLECYSTECTOMY     COLONOSCOPY WITH PROPOFOL N/A 02/27/2018   Procedure: COLONOSCOPY WITH PROPOFOL;  Surgeon: Midge Minium, MD;  Location: [facility];  Service: Endoscopy;  Laterality: N/A;   ESOPHAGOGASTRODUODENOSCOPY (EGD) WITH PROPOFOL N/A 01/12/2018   Procedure: ESOPHAGOGASTRODUODENOSCOPY (EGD) WITH BIOPSIES;  Surgeon: Midge Minium, MD;  Location: [facility];  Service: Endoscopy;  Laterality: N/A;   ESOPHAGOGASTRODUODENOSCOPY (EGD) WITH PROPOFOL N/A 01/25/2018   Procedure: ESOPHAGOGASTRODUODENOSCOPY (EGD) WITH PROPOFOL;  Surgeon: Wyline Mood, MD;  Location: [facility] ENDOSCOPY;  Service: Gastroenterology;  Laterality: N/A;   ESOPHAGOGASTRODUODENOSCOPY (EGD) WITH PROPOFOL N/A 02/27/2018   Procedure: ESOPHAGOGASTRODUODENOSCOPY (EGD) WITH PROPOFOL;  Surgeon: Midge Minium, MD;  Location: [facility];  Service: Endoscopy;  Laterality: N/A;   ESOPHAGOGASTRODUODENOSCOPY (EGD) WITH PROPOFOL N/A 05/09/2021   Procedure: ESOPHAGOGASTRODUODENOSCOPY (EGD) WITH PROPOFOL;  Surgeon: Jaynie Collins, DO;  Location: [facility];  Service: Gastroenterology;  Laterality: N/A;   ESOPHAGOGASTRODUODENOSCOPY (EGD) WITH PROPOFOL N/A 03/09/2022   Procedure: ESOPHAGOGASTRODUODENOSCOPY (EGD) WITH PROPOFOL;  Surgeon: Midge Minium, MD;  Location: [facility] ENDOSCOPY;  Service: Endoscopy;  Laterality: N/A;   INCISIONAL HERNIA REPAIR N/A  11/23/2018   Procedure: HERNIA REPAIR INCISIONAL;  Surgeon: Henrene Dodge, MD;  Location: [facility] ORS;  Service: General;  Laterality: N/A;   INSERTION OF MESH N/A 11/23/2018   Procedure: INSERTION OF MESH;  Surgeon: Henrene Dodge, MD;  Location: [facility] ORS;  Service: General;  Laterality: N/A;   LAPAROTOMY N/A 01/20/2018   Procedure: EXPLORATORY LAPAROTOMY;  Surgeon: Henrene Dodge, MD;  Location: [facility] ORS;  Service: General;  Laterality: N/A;   TOOTH EXTRACTION      Review of Systems:    All systems reviewed and negative except where noted in HPI.   Physical Examination:   There were no vitals taken for this visit.  General: Well-nourished, well-developed in no acute distress.  Lungs: Clear to auscultation bilaterally. Non-labored. Heart: Regular rate and rhythm, no murmurs rubs or gallops.  Abdomen: Bowel sounds are normal; Abdomen is Soft; No hepatosplenomegaly, masses or hernias;  No Abdominal Tenderness; No guarding or rebound tenderness. Neuro: Alert and oriented x 3.  Grossly intact.  Psych: Alert and cooperative, normal mood and affect.   Imaging Studies: No results found.  Assessment and Plan:   Tanya Nguyen is a 58 y.o. y/o female   History of Peptic Ulcer disease Hx Upper GI Bleed Hx  of Gastrojejunostomy (2019) Chronic Dysphagia Chronic Nausea / Vomiting Chronic Abdominal Pain Weight Loss  Plan: -Continue Pantoprazole 40mg  daily -Avoid all NSAIDS -CT Chest / Abd / Pelvis -Labs    Celso Amy, PA-C  Follow up ***  BP check ***

## 2023-03-29 ENCOUNTER — Inpatient Hospital Stay: Payer: PPO

## 2023-03-29 ENCOUNTER — Inpatient Hospital Stay
Admission: EM | Admit: 2023-03-29 | Discharge: 2023-04-01 | DRG: 640 | Disposition: A | Discharge status: Home / Self Care | Payer: PPO | Attending: Internal Medicine | Admitting: Internal Medicine

## 2023-03-29 ENCOUNTER — Other Ambulatory Visit: Payer: Self-pay

## 2023-03-29 ENCOUNTER — Ambulatory Visit: Payer: PPO | Admitting: Physician Assistant

## 2023-03-29 ENCOUNTER — Encounter: Payer: Self-pay | Admitting: Internal Medicine

## 2023-03-29 ENCOUNTER — Telehealth: Payer: Self-pay | Admitting: Gastroenterology

## 2023-03-29 DIAGNOSIS — G4700 Insomnia, unspecified: Secondary | ICD-10-CM | POA: Diagnosis not present

## 2023-03-29 DIAGNOSIS — Z801 Family history of malignant neoplasm of trachea, bronchus and lung: Secondary | ICD-10-CM

## 2023-03-29 DIAGNOSIS — Z980 Intestinal bypass and anastomosis status: Secondary | ICD-10-CM

## 2023-03-29 DIAGNOSIS — Z880 Allergy status to penicillin: Secondary | ICD-10-CM

## 2023-03-29 DIAGNOSIS — G894 Chronic pain syndrome: Secondary | ICD-10-CM | POA: Diagnosis present

## 2023-03-29 DIAGNOSIS — Z882 Allergy status to sulfonamides status: Secondary | ICD-10-CM | POA: Diagnosis not present

## 2023-03-29 DIAGNOSIS — G629 Polyneuropathy, unspecified: Secondary | ICD-10-CM | POA: Diagnosis not present

## 2023-03-29 DIAGNOSIS — D751 Secondary polycythemia: Secondary | ICD-10-CM | POA: Diagnosis present

## 2023-03-29 DIAGNOSIS — F3130 Bipolar disorder, current episode depressed, mild or moderate severity, unspecified: Secondary | ICD-10-CM | POA: Diagnosis not present

## 2023-03-29 DIAGNOSIS — E876 Hypokalemia: Secondary | ICD-10-CM | POA: Diagnosis not present

## 2023-03-29 DIAGNOSIS — R112 Nausea with vomiting, unspecified: Secondary | ICD-10-CM

## 2023-03-29 DIAGNOSIS — K30 Functional dyspepsia: Secondary | ICD-10-CM | POA: Diagnosis not present

## 2023-03-29 DIAGNOSIS — Z888 Allergy status to other drugs, medicaments and biological substances status: Secondary | ICD-10-CM | POA: Diagnosis not present

## 2023-03-29 DIAGNOSIS — E861 Hypovolemia: Secondary | ICD-10-CM | POA: Diagnosis not present

## 2023-03-29 DIAGNOSIS — D509 Iron deficiency anemia, unspecified: Secondary | ICD-10-CM | POA: Diagnosis present

## 2023-03-29 DIAGNOSIS — R634 Abnormal weight loss: Secondary | ICD-10-CM | POA: Diagnosis present

## 2023-03-29 DIAGNOSIS — R195 Other fecal abnormalities: Secondary | ICD-10-CM | POA: Diagnosis not present

## 2023-03-29 DIAGNOSIS — Z8711 Personal history of peptic ulcer disease: Secondary | ICD-10-CM | POA: Diagnosis not present

## 2023-03-29 DIAGNOSIS — W449XXA Unspecified foreign body entering into or through a natural orifice, initial encounter: Secondary | ICD-10-CM | POA: Diagnosis present

## 2023-03-29 DIAGNOSIS — Z818 Family history of other mental and behavioral disorders: Secondary | ICD-10-CM

## 2023-03-29 DIAGNOSIS — R531 Weakness: Secondary | ICD-10-CM | POA: Diagnosis not present

## 2023-03-29 DIAGNOSIS — K529 Noninfective gastroenteritis and colitis, unspecified: Secondary | ICD-10-CM | POA: Diagnosis not present

## 2023-03-29 DIAGNOSIS — Z1152 Encounter for screening for COVID-19: Secondary | ICD-10-CM

## 2023-03-29 DIAGNOSIS — Z823 Family history of stroke: Secondary | ICD-10-CM

## 2023-03-29 DIAGNOSIS — T183XXA Foreign body in small intestine, initial encounter: Secondary | ICD-10-CM | POA: Diagnosis not present

## 2023-03-29 DIAGNOSIS — Z681 Body mass index (BMI) 19 or less, adult: Secondary | ICD-10-CM

## 2023-03-29 DIAGNOSIS — E43 Unspecified severe protein-calorie malnutrition: Secondary | ICD-10-CM | POA: Diagnosis not present

## 2023-03-29 DIAGNOSIS — Z811 Family history of alcohol abuse and dependence: Secondary | ICD-10-CM

## 2023-03-29 DIAGNOSIS — E871 Hypo-osmolality and hyponatremia: Secondary | ICD-10-CM | POA: Diagnosis not present

## 2023-03-29 DIAGNOSIS — Z79899 Other long term (current) drug therapy: Secondary | ICD-10-CM

## 2023-03-29 DIAGNOSIS — Z8719 Personal history of other diseases of the digestive system: Secondary | ICD-10-CM | POA: Diagnosis not present

## 2023-03-29 DIAGNOSIS — K2970 Gastritis, unspecified, without bleeding: Secondary | ICD-10-CM | POA: Diagnosis not present

## 2023-03-29 DIAGNOSIS — Z79890 Hormone replacement therapy (postmenopausal): Secondary | ICD-10-CM | POA: Diagnosis not present

## 2023-03-29 DIAGNOSIS — D75839 Thrombocytosis, unspecified: Secondary | ICD-10-CM | POA: Diagnosis not present

## 2023-03-29 DIAGNOSIS — Z803 Family history of malignant neoplasm of breast: Secondary | ICD-10-CM

## 2023-03-29 DIAGNOSIS — R197 Diarrhea, unspecified: Secondary | ICD-10-CM | POA: Diagnosis not present

## 2023-03-29 DIAGNOSIS — K920 Hematemesis: Secondary | ICD-10-CM | POA: Insufficient documentation

## 2023-03-29 DIAGNOSIS — Z813 Family history of other psychoactive substance abuse and dependence: Secondary | ICD-10-CM

## 2023-03-29 DIAGNOSIS — F32A Depression, unspecified: Secondary | ICD-10-CM | POA: Diagnosis present

## 2023-03-29 DIAGNOSIS — N200 Calculus of kidney: Secondary | ICD-10-CM | POA: Diagnosis not present

## 2023-03-29 DIAGNOSIS — R110 Nausea: Secondary | ICD-10-CM

## 2023-03-29 DIAGNOSIS — F419 Anxiety disorder, unspecified: Secondary | ICD-10-CM | POA: Diagnosis not present

## 2023-03-29 DIAGNOSIS — K7689 Other specified diseases of liver: Secondary | ICD-10-CM | POA: Diagnosis not present

## 2023-03-29 DIAGNOSIS — G43909 Migraine, unspecified, not intractable, without status migrainosus: Secondary | ICD-10-CM | POA: Diagnosis present

## 2023-03-29 DIAGNOSIS — K219 Gastro-esophageal reflux disease without esophagitis: Secondary | ICD-10-CM | POA: Diagnosis not present

## 2023-03-29 DIAGNOSIS — E039 Hypothyroidism, unspecified: Secondary | ICD-10-CM | POA: Diagnosis present

## 2023-03-29 DIAGNOSIS — Z809 Family history of malignant neoplasm, unspecified: Secondary | ICD-10-CM

## 2023-03-29 HISTORY — DX: Hematemesis: K92.0

## 2023-03-29 LAB — BASIC METABOLIC PANEL
Anion gap: 12 (ref 5–15)
BUN: <5 mg/dL — ABNORMAL LOW (ref 6–20)
CO2: 25 mmol/L (ref 22–32)
Calcium: 8.4 mg/dL — ABNORMAL LOW (ref 8.9–10.3)
Chloride: 88 mmol/L — ABNORMAL LOW (ref 98–111)
Creatinine, Ser: 0.87 mg/dL (ref 0.44–1.00)
GFR, Estimated: >60 mL/min (ref >60)
Glucose, Bld: 200 mg/dL — ABNORMAL HIGH (ref 70–99)
Potassium: 3.4 mmol/L — ABNORMAL LOW (ref 3.5–5.1)
Sodium: 125 mmol/L — ABNORMAL LOW (ref 135–145)

## 2023-03-29 LAB — URINALYSIS, W/ REFLEX TO CULTURE (INFECTION SUSPECTED)
Bacteria, UA: NONE SEEN
Bilirubin Urine: NEGATIVE
Glucose, UA: NEGATIVE mg/dL
Hgb urine dipstick: NEGATIVE
Ketones, ur: NEGATIVE mg/dL
Leukocytes,Ua: NEGATIVE
Nitrite: NEGATIVE
Protein, ur: NEGATIVE mg/dL
RBC / HPF: 0 RBC/hpf (ref 0–5)
Specific Gravity, Urine: 1.005 (ref 1.005–1.030)
pH: 6 (ref 5.0–8.0)

## 2023-03-29 LAB — CBC
HCT: 42.3 % (ref 36.0–46.0)
Hemoglobin: 15.3 g/dL — ABNORMAL HIGH (ref 12.0–15.0)
MCH: 30 pg (ref 26.0–34.0)
MCHC: 36.2 g/dL — ABNORMAL HIGH (ref 30.0–36.0)
MCV: 82.9 fL (ref 80.0–100.0)
Platelets: 421 10*3/uL — ABNORMAL HIGH (ref 150–400)
RBC: 5.1 MIL/uL (ref 3.87–5.11)
RDW: 11.9 % (ref 11.5–15.5)
WBC: 6.1 10*3/uL (ref 4.0–10.5)
nRBC: 0 % (ref 0.0–0.2)

## 2023-03-29 LAB — HEPATIC FUNCTION PANEL
ALT: 28 U/L (ref 0–44)
AST: 47 U/L — ABNORMAL HIGH (ref 15–41)
Albumin: 2.9 g/dL — ABNORMAL LOW (ref 3.5–5.0)
Alkaline Phosphatase: 79 U/L (ref 38–126)
Bilirubin, Direct: 0.2 mg/dL (ref 0.0–0.2)
Indirect Bilirubin: 0.7 mg/dL (ref 0.3–0.9)
Total Bilirubin: 0.9 mg/dL (ref 0.0–1.2)
Total Protein: 5.7 g/dL — ABNORMAL LOW (ref 6.5–8.1)

## 2023-03-29 LAB — OSMOLALITY, URINE: Osmolality, Ur: 78 mosm/kg — ABNORMAL LOW (ref 300–900)

## 2023-03-29 LAB — RESP PANEL BY RT-PCR (RSV, FLU A&B, COVID)  RVPGX2
Influenza A by PCR: NEGATIVE
Influenza B by PCR: NEGATIVE
Resp Syncytial Virus by PCR: NEGATIVE
SARS Coronavirus 2 by RT PCR: NEGATIVE

## 2023-03-29 LAB — NA AND K (SODIUM & POTASSIUM), RAND UR
Potassium Urine: 5 mmol/L
Sodium, Ur: <10 mmol/L

## 2023-03-29 LAB — TSH: TSH: 0.027 u[IU]/mL — ABNORMAL LOW (ref 0.350–4.500)

## 2023-03-29 LAB — MAGNESIUM: Magnesium: 1.5 mg/dL — ABNORMAL LOW (ref 1.7–2.4)

## 2023-03-29 LAB — OSMOLALITY: Osmolality: 276 mosm/kg (ref 275–295)

## 2023-03-29 LAB — SODIUM: Sodium: 129 mmol/L — ABNORMAL LOW (ref 135–145)

## 2023-03-29 LAB — T4, FREE: Free T4: 1.82 ng/dL — ABNORMAL HIGH (ref 0.61–1.12)

## 2023-03-29 LAB — CBG MONITORING, ED: Glucose-Capillary: 194 mg/dL — ABNORMAL HIGH (ref 70–99)

## 2023-03-29 LAB — PHOSPHORUS: Phosphorus: 3.6 mg/dL (ref 2.5–4.6)

## 2023-03-29 MED ORDER — ONDANSETRON HCL 4 MG/2ML IJ SOLN
4.0000 mg | Freq: Once | INTRAMUSCULAR | Status: AC
  Administered 2023-03-29: 4 mg via INTRAVENOUS
  Filled 2023-03-29: qty 2

## 2023-03-29 MED ORDER — ADULT MULTIVITAMIN W/MINERALS CH
1.0000 | ORAL_TABLET | Freq: Every day | ORAL | Status: DC
Start: 2023-03-29 — End: 2023-04-01
  Administered 2023-03-29 – 2023-04-01 (×3): 1 via ORAL
  Filled 2023-03-29 (×3): qty 1

## 2023-03-29 MED ORDER — SODIUM CHLORIDE 0.9 % IV BOLUS
1000.0000 mL | Freq: Once | INTRAVENOUS | Status: AC
  Administered 2023-03-29: 1000 mL via INTRAVENOUS

## 2023-03-29 MED ORDER — MELATONIN 5 MG PO TABS: 5.0000 mg | ORAL_TABLET | Freq: Every evening | ORAL | Status: DC | PRN

## 2023-03-29 MED ORDER — ACETAMINOPHEN 650 MG RE SUPP
650.0000 mg | Freq: Four times a day (QID) | RECTAL | Status: DC | PRN
Start: 2023-03-29 — End: 2023-04-01

## 2023-03-29 MED ORDER — PANTOPRAZOLE SODIUM 40 MG PO TBEC: 40.0000 mg | DELAYED_RELEASE_TABLET | Freq: Every day | ORAL | 0 refills | Status: DC

## 2023-03-29 MED ORDER — BOOST / RESOURCE BREEZE PO LIQD CUSTOM
1.0000 | Freq: Three times a day (TID) | ORAL | Status: DC
  Administered 2023-03-29 – 2023-04-01 (×4): 1 via ORAL

## 2023-03-29 MED ORDER — HYDROXYZINE HCL 25 MG PO TABS: 12.5000 mg | ORAL_TABLET | Freq: Every evening | ORAL | Status: DC | PRN

## 2023-03-29 MED ORDER — ONDANSETRON HCL 4 MG PO TABS
4.0000 mg | ORAL_TABLET | Freq: Four times a day (QID) | ORAL | Status: DC | PRN
  Administered 2023-03-29: 4 mg via ORAL
  Filled 2023-03-29: qty 1

## 2023-03-29 MED ORDER — OXYBUTYNIN CHLORIDE ER 5 MG PO TB24
5.0000 mg | ORAL_TABLET | Freq: Every day | ORAL | Status: DC
  Administered 2023-03-29: 5 mg via ORAL
  Filled 2023-03-29 (×2): qty 1

## 2023-03-29 MED ORDER — ACETAMINOPHEN 325 MG PO TABS
650.0000 mg | ORAL_TABLET | Freq: Four times a day (QID) | ORAL | Status: DC | PRN
  Administered 2023-03-30: 650 mg via ORAL
  Filled 2023-03-29: qty 2

## 2023-03-29 MED ORDER — PANTOPRAZOLE SODIUM 40 MG IV SOLR
40.0000 mg | Freq: Two times a day (BID) | INTRAVENOUS | Status: AC
  Administered 2023-03-29 – 2023-03-31 (×4): 40 mg via INTRAVENOUS
  Filled 2023-03-29 (×4): qty 10

## 2023-03-29 MED ORDER — POTASSIUM CHLORIDE CRYS ER 20 MEQ PO TBCR
20.0000 meq | EXTENDED_RELEASE_TABLET | Freq: Once | ORAL | Status: AC
  Administered 2023-03-29: 20 meq via ORAL
  Filled 2023-03-29: qty 1

## 2023-03-29 MED ORDER — OXYCODONE-ACETAMINOPHEN 5-325 MG PO TABS
1.0000 | ORAL_TABLET | Freq: Once | ORAL | Status: AC
  Administered 2023-03-29: 1 via ORAL
  Filled 2023-03-29: qty 1

## 2023-03-29 MED ORDER — TRAMADOL HCL 50 MG PO TABS
50.0000 mg | ORAL_TABLET | Freq: Two times a day (BID) | ORAL | Status: DC | PRN
  Administered 2023-03-29 – 2023-03-30 (×2): 50 mg via ORAL
  Filled 2023-03-29 (×2): qty 1

## 2023-03-29 MED ORDER — IOHEXOL 300 MG/ML  SOLN
100.0000 mL | Freq: Once | INTRAMUSCULAR | Status: AC | PRN
  Administered 2023-03-29: 100 mL via INTRAVENOUS

## 2023-03-29 MED ORDER — ONDANSETRON HCL 4 MG/2ML IJ SOLN
4.0000 mg | Freq: Four times a day (QID) | INTRAMUSCULAR | Status: DC | PRN
  Administered 2023-04-01: 4 mg via INTRAVENOUS
  Filled 2023-03-29: qty 2

## 2023-03-29 MED ORDER — POTASSIUM CHLORIDE CRYS ER 20 MEQ PO TBCR: 20.0000 meq | EXTENDED_RELEASE_TABLET | Freq: Once | ORAL | Status: DC

## 2023-03-29 MED ORDER — DULOXETINE HCL 30 MG PO CPEP
60.0000 mg | ORAL_CAPSULE | Freq: Every day | ORAL | Status: DC
  Administered 2023-03-29 – 2023-03-31 (×2): 60 mg via ORAL
  Filled 2023-03-29: qty 1
  Filled 2023-03-29: qty 2

## 2023-03-29 MED ORDER — HEPARIN SODIUM (PORCINE) 5000 UNIT/ML IJ SOLN: 5000.0000 [IU] | Freq: Three times a day (TID) | INTRAMUSCULAR | Status: DC

## 2023-03-29 MED ORDER — PANTOPRAZOLE SODIUM 40 MG PO TBEC: 40.0000 mg | DELAYED_RELEASE_TABLET | Freq: Every day | ORAL | Status: DC

## 2023-03-29 MED ORDER — MAGNESIUM SULFATE 2 GM/50ML IV SOLN
2.0000 g | Freq: Once | INTRAVENOUS | Status: AC
  Administered 2023-03-29: 2 g via INTRAVENOUS
  Filled 2023-03-29: qty 50

## 2023-03-29 MED ORDER — ARIPIPRAZOLE 2 MG PO TABS
2.0000 mg | ORAL_TABLET | Freq: Every day | ORAL | Status: DC
  Administered 2023-03-29 – 2023-03-31 (×2): 2 mg via ORAL
  Filled 2023-03-29 (×4): qty 1

## 2023-03-29 NOTE — Assessment & Plan Note (Signed)
With hypomagnesemia Correct magnesium level Potassium chloride 20 mill equivalent p.o. one-time dose ordered on admission Recheck BMP in a.m.

## 2023-03-29 NOTE — Assessment & Plan Note (Signed)
Increasing weakness over the last 2-6 months B12 deficiency was considered, based on labs on 12/29/2022: Patient had a B12 level of 959 therefore serum B12 level was not checked on admission Magnesium level, phosphorus level were checked Fall precaution

## 2023-03-29 NOTE — Assessment & Plan Note (Signed)
Patient reports her last episode of coffee-ground emesis was 2 days ago She has been having worsening nausea and vomiting over the last 2 months that has caused her to have an unintentional 11 pound weight loss Protonix 40 mg IV twice daily ordered on admission for 2 days Recheck CBC in the a.m. Gastroenterology has been consulted

## 2023-03-29 NOTE — Assessment & Plan Note (Addendum)
-   Melatonin 5 mg nightly as needed for sleep ordered 

## 2023-03-29 NOTE — Assessment & Plan Note (Addendum)
Recommend close outpatient follow-up with PCP and GI provider as appropriate Gastroenterology has been consulted as patient endorses pencil thin stool

## 2023-03-29 NOTE — ED Notes (Signed)
See triage notes. Patient c/o feeling weak and nauseous.

## 2023-03-29 NOTE — Hospital Course (Addendum)
[name] is a 58 year old famale with history of hypothyroid, depression, anxiety, GERD, who presents for chief concern of weakness for several weeks.   Vitals in the ED showed temperature of 98.2, respiration of 16, HR 124, blood pressure of 116/106, spO2 of 94% on room air.   Serum sodium of 125, potassium 3.4, chloride 88, bicarb 25, BUN of less than 5, serum creatinine of 0.87, EGFR greater than 60, nonfasting glucose 200, WBC 6.1, hemoglobin 15.3, platelets of 421.  COVID/influenza A/influenza B/RSV PCR were negative.  UA has been ordered and pending collection.  ED treatment: Sodium chloride 1 L bolus, ondansetron 4 mg IV one-time dose.

## 2023-03-29 NOTE — Telephone Encounter (Signed)
The patient needs her medication refill. The medication (Protonix) 40 MG and [facility] on [address] [place], [place], [place] [address].

## 2023-03-29 NOTE — Assessment & Plan Note (Signed)
PDMP reviewed: Patient has tramadol 50 mg tablet, 60 tablets for 30 days, that was filled on 02/07/2023 Home tramadol 50 mg every 12 hours as needed for moderate pain resumed

## 2023-03-29 NOTE — Assessment & Plan Note (Signed)
-  Registered dietitian has been consulted 

## 2023-03-29 NOTE — Progress Notes (Signed)
Initial Nutrition Assessment  DOCUMENTATION CODES:   Underweight  INTERVENTION:   -Liberalize diet to regular for widest variety of meal selections -MVI with minerals daily -Boost Breeze po TID, each supplement provides 250 kcal and 9 grams of protein  -Magic cup TID with meals, each supplement provides 290 kcal and 9 grams of protein   NUTRITION DIAGNOSIS:   Increased nutrient needs related to acute illness as evidenced by estimated needs.  GOAL:   Patient will meet greater than or equal to 90% of their needs  MONITOR:   PO intake, Supplement acceptance  REASON FOR ASSESSMENT:   Consult Assessment of nutrition requirement/status  ASSESSMENT:   Pt past medical history significant for acquired pyloric stricture, chronic pain, who presented with weakness.  Pt admitted with weakness, nausea, vomiting,diarrhea, and hyponatremia.   Pt unavailable at time of visit. Attempted to speak with pt via call to hospital room phone, however, unable to reach. RD unable to obtain further nutrition-related history or complete nutrition-focused physical exam at this time.    Per previous RD notes, pt has history of severe malnutrition, which RD suspects is ongoing. 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 During last visit, pt refused Ensure supplements.   Per H&P, pt with multiple episodes of nausea, vomiting, and diarrhea over the past few weeks PTA. She endorses an 11# wt loss over the past few weeks. She also endorses generalized weakness and fatigue.   Pt currently on a heart healthy diet. No meal completion data available to assess at this time.   Reviewed wt hx; pt has experienced a 16.1% wt loss over the past 3 months, which is significant for time frame.   Medications reviewed and include protonix.   Labs reviewed: Na: 125, K: 3.4, Phos: 4.8, CBGS: 194 (inpatient orders for glycemic control are none).    Diet  Order:   Diet Order             Diet Heart Room service appropriate? Yes; Fluid consistency: Thin  Diet effective now                   EDUCATION NEEDS:   No education needs have been identified at this time  Skin:  Skin Assessment: Reviewed RN Assessment  Last BM:  Unknown  Height:   Ht Readings from Last 1 Encounters:  03/29/23 4\' 11"  (1.499 m)    Weight:   Wt Readings from Last 1 Encounters:  03/29/23 38.1 kg    Ideal Body Weight:  44.7 kg  BMI:  Body mass index is 16.96 kg/m.  Estimated Nutritional Needs:   Kcal:  1500-1700  Protein:  75-90 grams  Fluid:  > 1.5 L    Levada Schilling, RD, LDN, CDCES Registered Dietitian III Certified Diabetes Care and Education Specialist If unable to reach this RD, please use "RD Inpatient" group chat on secure chat between hours of 8am-4 pm daily

## 2023-03-29 NOTE — Assessment & Plan Note (Signed)
And polycythemia Query volume contraction in setting of GI loss Status post sodium chloride 1 L bolus per EDP Recheck CBC in a.m.

## 2023-03-29 NOTE — Assessment & Plan Note (Addendum)
In setting of weight loss with BMI of 16.96 Carcinoma is considered at this time Agree with EDP, CT abdomen pelvis with contrast Check magnesium, phosphorus, serum osmolality, urine osmolality, hepatic function panel on admission Patient is status post sodium chloride 1 L bolus per EDP No further IV fluid ordered on admission Goal sodium increase over the next 24 hours is 8-10 Timed serum sodium recheck at 20:00 hours on day of admission BMP in the a.m.

## 2023-03-29 NOTE — ED Notes (Signed)
 Called CCMD for cardiac monitoring

## 2023-03-29 NOTE — Assessment & Plan Note (Signed)
Home hydroxyzine 12.5 to 25 mg tablet nightly as needed for anxiety resumed

## 2023-03-29 NOTE — Addendum Note (Signed)
Addended by: Roena Malady on: 03/29/2023 09:58 AM   Modules accepted: Orders

## 2023-03-29 NOTE — H&P (Signed)
History and Physical   Tanya Nguyen ZOX:[id number] DOB: Jul 24, 1965 DOA: 03/29/2023  PCP: Doreene Nest, NP  Outpatient Specialists: Dr. Vanetta Shawl, behavioral health Patient coming from: home  I have personally briefly reviewed patient's old medical records in [facility] EMR.  Chief Concern: weakness  HPI: [name] is a 58 year old famale with history of hypothyroid, depression, anxiety, GERD, who presents for chief concern of weakness for several weeks.   Vitals in the ED showed temperature of 98.2, respiration of 16, HR 124, blood pressure of 116/106, spO2 of 94% on room air.   Serum sodium of 125, potassium 3.4, chloride 88, bicarb 25, BUN of less than 5, serum creatinine of 0.87, EGFR greater than 60, nonfasting glucose 200, WBC 6.1, hemoglobin 15.3, platelets of 421.  COVID/influenza A/influenza B/RSV PCR were negative.  UA has been ordered and pending collection.  ED treatment: Sodium chloride 1 L bolus, ondansetron 4 mg IV one-time dose. ---------------------------- At bedside, patient is able to tell me her name, age, location, current calendar year.  She reports that she has been having increasing generalized weakness over the last 2 months.  She reports poor appetite and has been having nausea and vomiting every time she tries to eat something.  She reports that occasionally she has coffee-ground like vomitus.  She reports the last coffee-ground vomitus was 2 days ago.  She endorses an unintentional 11 pound weight loss over the last 6 months.  She reports that she has bowel movements every other day and reports that sometimes she also has pencil thin stools.  She reports she last had a colonoscopy about 2 to 3 years ago with Dr. Servando Snare and does not know when her next routine colonoscopy should be.  Social history: She lives at home with her husband.  She denies tobacco, EtOH, recreational drug use.  She is on disability for severe  neuropathy.  ROS: Constitutional: + weight change (loss), no fever ENT/Mouth: no sore throat, no rhinorrhea Eyes: no eye pain, no vision changes Cardiovascular: no chest pain, no dyspnea,  no edema, no palpitations Respiratory: no cough, no sputum, no wheezing Gastrointestinal: + nausea, + vomiting, no diarrhea, no constipation Genitourinary: no urinary incontinence, no dysuria, no hematuria Musculoskeletal: no arthralgias, no myalgias Skin: no skin lesions, no pruritus, Neuro: + weakness, no loss of consciousness, no syncope Psych: no anxiety, no depression, + decrease appetite Heme/Lymph: no bruising, no bleeding  ED Course: Discussed with EDP, patient requiring hospitalization for chief concerns of hyponatremia.  Assessment/Plan  Principal Problem:   Hyponatremia Active Problems:   Hx of bypass gastrojejunostomy   GERD (gastroesophageal reflux disease)   Hypothyroidism   Depression   Weight loss   Insomnia   Weakness   Bipolar I disorder, most recent episode depressed (HCC)   Protein-calorie malnutrition, severe   Chronic pain syndrome   Iron deficiency anemia   Anxiety   Hypomagnesemia   Coffee ground emesis   Hypokalemia   Thrombocytosis   Assessment and Plan:  * Hyponatremia In setting of weight loss with BMI of 16.96 Carcinoma is considered at this time Agree with EDP, CT abdomen pelvis with contrast Check magnesium, phosphorus, serum osmolality, urine osmolality, hepatic function panel on admission Patient is status post sodium chloride 1 L bolus per EDP No further IV fluid ordered on admission Goal sodium increase over the next 24 hours is 8-10 Timed serum sodium recheck at 20:00 hours on day of admission BMP in the a.m.  Thrombocytosis And polycythemia  Query volume contraction in setting of GI loss Status post sodium chloride 1 L bolus per EDP Recheck CBC in a.m.  Hypokalemia With hypomagnesemia Correct magnesium level Potassium chloride 20 mill  equivalent p.o. one-time dose ordered on admission Recheck BMP in a.m.  Coffee ground emesis Patient reports her last episode of coffee-ground emesis was 2 days ago She has been having worsening nausea and vomiting over the last 2 months that has caused her to have an unintentional 11 pound weight loss Protonix 40 mg IV twice daily ordered on admission for 2 days Recheck CBC in the a.m. Gastroenterology has been consulted  Hypomagnesemia Serum magnesium is 1.5 on admission Supplement with magnesium sulfate 2 g IV one-time dose ordered on admission Rechecks magnesium level in the a.m.  Anxiety Home hydroxyzine 12.5 to 25 mg tablet nightly as needed for anxiety resumed  Chronic pain syndrome PDMP reviewed: Patient has tramadol 50 mg tablet, 60 tablets for 30 days, that was filled on 02/07/2023 Home tramadol 50 mg every 12 hours as needed for moderate pain resumed  Protein-calorie malnutrition, severe Registered dietitian has been consulted  Weakness Increasing weakness over the last 2-6 months B12 deficiency was considered, based on labs on 12/29/2022: Patient had a B12 level of 959 therefore serum B12 level was not checked on admission Magnesium level, phosphorus level were checked Fall precaution  Insomnia Melatonin 5 mg nightly as needed for sleep ordered  Weight loss Recommend close outpatient follow-up with PCP and GI provider as appropriate Gastroenterology has been consulted as patient endorses pencil thin stool  Depression Home duloxetine 60 mg nightly, omeprazole 2 mg nightly resumed on admission  Hypothyroidism Outpatient TSH from 03/11/2023 and 03/17/2023 were 0.07 and 0.04 respectively Home levothyroxine 75 mcg not resumed on admission Per medication reconciliation, patient took her dose of levothyroxine in the AM prior to ED presentation Patient will need dosing to be re-evaluated given weight loss  GERD (gastroesophageal reflux disease) Home Protonix 40 mg daily  resumed  Chart reviewed.   DVT prophylaxis: TED hose; pharmacologic DVT prophylaxis not initiated on admission due to concerns of upper GI bleed Code Status: Full code Diet: Heart healthy diet Family Communication: A phone call was offered, patient declined stating that her husband already knows she is being admitted to the hospital Disposition Plan: Pending clinical course Consults called: Gastroenterology Admission status: Telemetry cardiac, inpatient  Past Medical History:  Diagnosis Date   Acquired pyloric stricture    Bleeding duodenal ulcer    CAP (community acquired pneumonia) 02/12/2020   Chronic esophagogastric ulcer    Constipation 04/02/2014   Depression    Duodenal obstruction    Gastric outlet obstruction 01/19/2018   GERD (gastroesophageal reflux disease)    Hair loss 09/22/2018   Head injury 12/24/2022   History of stomach ulcers    Hypothyroidism    Incisional hernia, without obstruction or gangrene    Intractable vomiting    Kidney stones    Melena 05/08/2021   Migraine    Migraine headache    only1-2x/month since starting topamax   Neuropathy    Nodule of middle lobe of right lung 09/17/2021   NSAID long-term use 09/16/2016   Right-sided chest pain 09/17/2021   Sacroiliac joint pain 08/14/2018   Side pain 03/30/2018   Upper GI bleed 05/18/2021   Past Surgical History:  Procedure Laterality Date   ABDOMINAL HYSTERECTOMY  2004   partial   CHOLECYSTECTOMY     COLONOSCOPY WITH PROPOFOL N/A 02/27/2018   Procedure:  COLONOSCOPY WITH PROPOFOL;  Surgeon: Midge Minium, MD;  Location: [facility];  Service: Endoscopy;  Laterality: N/A;   ESOPHAGOGASTRODUODENOSCOPY (EGD) WITH PROPOFOL N/A 01/12/2018   Procedure: ESOPHAGOGASTRODUODENOSCOPY (EGD) WITH BIOPSIES;  Surgeon: Midge Minium, MD;  Location: [facility];  Service: Endoscopy;  Laterality: N/A;   ESOPHAGOGASTRODUODENOSCOPY (EGD) WITH PROPOFOL N/A 01/25/2018   Procedure:  ESOPHAGOGASTRODUODENOSCOPY (EGD) WITH PROPOFOL;  Surgeon: Wyline Mood, MD;  Location: [facility];  Service: Gastroenterology;  Laterality: N/A;   ESOPHAGOGASTRODUODENOSCOPY (EGD) WITH PROPOFOL N/A 02/27/2018   Procedure: ESOPHAGOGASTRODUODENOSCOPY (EGD) WITH PROPOFOL;  Surgeon: Midge Minium, MD;  Location: [facility];  Service: Endoscopy;  Laterality: N/A;   ESOPHAGOGASTRODUODENOSCOPY (EGD) WITH PROPOFOL N/A 05/09/2021   Procedure: ESOPHAGOGASTRODUODENOSCOPY (EGD) WITH PROPOFOL;  Surgeon: Jaynie Collins, DO;  Location: [facility] ENDOSCOPY;  Service: Gastroenterology;  Laterality: N/A;   ESOPHAGOGASTRODUODENOSCOPY (EGD) WITH PROPOFOL N/A 03/09/2022   Procedure: ESOPHAGOGASTRODUODENOSCOPY (EGD) WITH PROPOFOL;  Surgeon: Midge Minium, MD;  Location: [facility] ENDOSCOPY;  Service: Endoscopy;  Laterality: N/A;   INCISIONAL HERNIA REPAIR N/A 11/23/2018   Procedure: HERNIA REPAIR INCISIONAL;  Surgeon: Henrene Dodge, MD;  Location: [facility] ORS;  Service: General;  Laterality: N/A;   INSERTION OF MESH N/A 11/23/2018   Procedure: INSERTION OF MESH;  Surgeon: Henrene Dodge, MD;  Location: [facility] ORS;  Service: General;  Laterality: N/A;   LAPAROTOMY N/A 01/20/2018   Procedure: EXPLORATORY LAPAROTOMY;  Surgeon: Henrene Dodge, MD;  Location: [facility] ORS;  Service: General;  Laterality: N/A;   TOOTH EXTRACTION     Social History:  reports that she has never smoked. She has never used smokeless tobacco. She reports that she does not currently use alcohol. She reports that she does not use drugs.  Allergies  Allergen Reactions   Penicillins Shortness Of Breath, Diarrhea and Nausea And Vomiting    Has patient had a PCN reaction causing immediate rash, facial/tongue/throat swelling, SOB or lightheadedness with hypotension: yes Has patient had a PCN reaction causing severe rash involving mucus membranes or skin necrosis: no Has patient had a PCN reaction that required hospitalization no Has patient had a PCN reaction  occurring within the last 10 years: about 10 years If all of the above answers are "NO", then may proceed with Cephalosporin use.    Nsaids Other (See Comments)    Pt states she is not allergic  Other reaction(s): OTHER Pt states NSAIDS cause "internal bleeding"   Gabapentin Other (See Comments)    Hair Loss    Pregabalin     Tremors   Sulfa Antibiotics Nausea And Vomiting   Family History  Problem Relation Age of Onset   Cancer Mother    Breast cancer Mother 36   Lung cancer Father    Drug abuse Sister    Alcohol abuse Sister    Bipolar disorder Sister    Alcohol abuse Maternal Grandmother    Stroke Maternal Grandmother    Stroke Paternal Grandmother    Family history: Family history reviewed and not pertinent.  Prior to Admission medications   Medication Sig Start Date End Date Taking? Authorizing Provider  ARIPiprazole (ABILIFY) 2 MG tablet Take 1 tablet (2 mg total) by mouth at bedtime. 01/31/23 05/01/23 Yes Hisada, Barbee Cough, MD  DULoxetine (CYMBALTA) 60 MG capsule TAKE 1 CAPSULE (60 MG TOTAL) BY MOUTH DAILY. FOR ANXIETY AND PAIN. 03/25/22  Yes Doreene Nest, NP  hydrOXYzine (ATARAX) 25 MG tablet Take 0.5-1 tablets (12.5-25 mg total) by mouth at bedtime as needed for anxiety. 03/03/23 04/02/23 Yes  Arfeen, Phillips Grout, MD  levothyroxine (SYNTHROID) 75 MCG tablet Take 1 tablet by mouth every morning on an empty stomach with water only.  No food or other medications for 30 minutes. 03/18/23  Yes Doreene Nest, NP  mirtazapine (REMERON) 15 MG tablet 7.5 mg at night for one week, then 15 mg at night 03/10/23  Yes Hisada, Reina, MD  oxybutynin (DITROPAN-XL) 5 MG 24 hr tablet TAKE 1 TABLET (5 MG TOTAL) BY MOUTH AT BEDTIME. FOR BLADDER INCONTINENCE. 01/21/23  Yes Doreene Nest, NP  pantoprazole (PROTONIX) 40 MG tablet Take 1 tablet (40 mg total) by mouth daily. 03/29/23  Yes Midge Minium, MD  SUMAtriptan (IMITREX) 50 MG tablet Take 1 tablet at migraine onset. May repeat in 2 hours if  headache persists or recurs. 03/08/23  Yes Doreene Nest, NP  traMADol (ULTRAM) 50 MG tablet Take 1 tablet (50 mg total) by mouth every 12 (twelve) hours as needed. 02/01/23  Yes Edward Jolly, MD  doxepin (SINEQUAN) 10 MG capsule Take 1 capsule (10 mg total) by mouth at bedtime as needed (insomnia). Patient not taking: Reported on 03/17/2023 03/10/23 05/09/23  Neysa Hotter, MD  DULoxetine (CYMBALTA) 30 MG capsule TAKE 1 CAPSULE (30 MG TOTAL) BY MOUTH DAILY. FOR ANXIETY AND PAIN. TAKE WITH 60 MG. Patient not taking: Reported on 03/17/2023 11/09/22   Doreene Nest, NP   Physical Exam: Vitals:   03/29/23 1057 03/29/23 1521 03/29/23 1555 03/29/23 1700  BP:   123/76 134/82  Pulse:  98 98 (!) 102  Resp: 16 16 18 17   Temp:   98 F (36.7 C)   TempSrc:      SpO2:  100% 100% 99%  Weight:      Height:       Constitutional: appears age appropriate, cachectic appearing Eyes: PERRL, lids and conjunctivae normal ENMT: Mucous membranes are moist. Posterior pharynx clear of any exudate or lesions. Age-appropriate dentition. Hearing appropriate Neck: normal, supple, no masses, no thyromegaly Respiratory: clear to auscultation bilaterally, no wheezing, no crackles. Normal respiratory effort. No accessory muscle use.  Cardiovascular: Regular rate and rhythm, no murmurs / rubs / gallops. No extremity edema. 2+ pedal pulses. No carotid bruits.  Abdomen: Scaphoid abdomen, no tenderness, no masses palpated, no hepatosplenomegaly. Bowel sounds positive.  Musculoskeletal: no clubbing / cyanosis. No joint deformity upper and lower extremities. Good ROM, no contractures, no atrophy. Normal muscle tone.  Skin: no rashes, lesions, ulcers. No induration Neurologic: Sensation intact. Strength 5/5 in all 4.  Psychiatric: Normal judgment and insight. Alert and oriented x 3. Normal mood.   EKG: independently reviewed, showing sinus tachycardia with rate of 118, QTc 06/13/2012  Chest x-ray on Admission: I personally  reviewed and I agree with radiologist reading as below.  CT ABDOMEN PELVIS W CONTRAST Result Date: 03/29/2023 CLINICAL DATA:  Weakness for several weeks, vomiting, chills EXAM: CT ABDOMEN AND PELVIS WITH CONTRAST TECHNIQUE: Multidetector CT imaging of the abdomen and pelvis was performed using the standard protocol following bolus administration of intravenous contrast. RADIATION DOSE REDUCTION: This exam was performed according to the departmental dose-optimization program which includes automated exposure control, adjustment of the mA and/or kV according to patient size and/or use of iterative reconstruction technique. CONTRAST:  OMNIPAQUE IOHEXOL 300 MG/ML  SOLN COMPARISON:  09/21/2021 FINDINGS: Lower chest: No acute pleural or parenchymal lung disease. Hepatobiliary: Cholecystectomy. Simple appearing cysts within the left and right lobes of the liver are stable and do not require follow-up. The remainder of  the liver is unremarkable. No biliary duct dilation. Pancreas: Unremarkable. No pancreatic ductal dilatation or surrounding inflammatory changes. Spleen: Normal in size without focal abnormality. Adrenals/Urinary Tract: There are bilateral nonobstructing less than 3 mm renal calculi, not appreciably changed since prior exam. No evidence of obstructive uropathy. Bladder is moderately distended without focal abnormality. The adrenals are normal. Stomach/Bowel: Postsurgical changes from prior gastrojejunostomy and jejunojejunostomy, with likely functional dilatation of the efferent jejunostomy loop and unchanged since prior CT and upper GI studies. No evidence of bowel obstruction or ileus. The appendix, if still present, is not well visualized. No bowel wall thickening or inflammatory change. Vascular/Lymphatic: Aortic atherosclerosis. No enlarged abdominal or pelvic lymph nodes. Reproductive: Uterus and bilateral adnexa are unremarkable. Other: No free fluid or free intraperitoneal gas. No abdominal  wall hernia. Musculoskeletal: No acute or destructive bony abnormalities. Reconstructed images demonstrate no additional findings. IMPRESSION: 1. Prior gastrojejunostomy and jejunojejunostomy, stable in appearance. 2. Multiple punctate bilateral nonobstructing renal calculi. No obstructive uropathy within either kidney. 3. Otherwise no acute intra-abdominal or intrapelvic process. 4.  Aortic Atherosclerosis (ICD10-I70.0). Electronically Signed   By: Sharlet Salina M.D.   On: 03/29/2023 15:17   Labs on Admission: I have personally reviewed following labs  CBC: Recent Labs  Lab 03/29/23 1051  WBC 6.1  HGB 15.3*  HCT 42.3  MCV 82.9  PLT 421*   Basic Metabolic Panel: Recent Labs  Lab 03/29/23 1051  NA 125*  K 3.4*  CL 88*  CO2 25  GLUCOSE 200*  BUN <5*  CREATININE 0.87  CALCIUM 8.4*  MG 1.5*  PHOS 3.6   GFR: Estimated Creatinine Clearance: 42.4 mL/min (by C-G formula based on SCr of 0.87 mg/dL).  CBG: Recent Labs  Lab 03/29/23 1227  GLUCAP 194*   Thyroid Function Tests: Recent Labs    03/29/23 1051  TSH 0.027*  FREET4 1.82*   Urine analysis:    Component Value Date/Time   COLORURINE STRAW (A) 03/29/2023 1051   APPEARANCEUR CLEAR (A) 03/29/2023 1051   APPEARANCEUR Clear 04/08/2014 1913   LABSPEC 1.005 03/29/2023 1051   LABSPEC 1.013 04/08/2014 1913   PHURINE 6.0 03/29/2023 1051   GLUCOSEU NEGATIVE 03/29/2023 1051   GLUCOSEU Negative 04/08/2014 1913   HGBUR NEGATIVE 03/29/2023 1051   BILIRUBINUR NEGATIVE 03/29/2023 1051   BILIRUBINUR Negative 09/17/2021 1153   BILIRUBINUR Negative 04/08/2014 1913   KETONESUR NEGATIVE 03/29/2023 1051   PROTEINUR NEGATIVE 03/29/2023 1051   UROBILINOGEN 0.2 09/17/2021 1153   NITRITE NEGATIVE 03/29/2023 1051   LEUKOCYTESUR NEGATIVE 03/29/2023 1051   LEUKOCYTESUR Negative 04/08/2014 1913   CRITICAL CARE Performed by: Dr. Sedalia Muta  Total critical care time: 32 minutes  Critical care time was exclusive of separately billable  procedures and treating other patients.  Critical care was necessary to treat or prevent imminent or life-threatening deterioration.  Critical care was time spent personally by me on the following activities: development of treatment plan with patient and/or surrogate as well as nursing, discussions with consultants, evaluation of patient's response to treatment, examination of patient, obtaining history from patient or surrogate, ordering and performing treatments and interventions, ordering and review of laboratory studies, ordering and review of radiographic studies, pulse oximetry and re-evaluation of patient's condition.  This document was prepared using Dragon Voice Recognition software and may include unintentional dictation errors.  Dr. Sedalia Muta [facility]  If 7PM-7AM, please contact overnight-coverage provider If 7AM-7PM, please contact day attending provider [email or web address]  03/29/2023, 6:07 PM

## 2023-03-29 NOTE — ED Triage Notes (Addendum)
Pt here with weakness for a few weeks. Pt denies any pain. Pt states she has been vomiting a lot and also having chills. Pt also c/o constipation, last bowel movement was a week ago. Pt ambulatory to triage.

## 2023-03-29 NOTE — ED Notes (Signed)
Two attempts at IV access were made without success.

## 2023-03-29 NOTE — Assessment & Plan Note (Addendum)
Outpatient TSH from 03/11/2023 and 03/17/2023 were 0.07 and 0.04 respectively Home levothyroxine 75 mcg not resumed on admission Per medication reconciliation, patient took her dose of levothyroxine in the AM prior to ED presentation Patient will need dosing to be re-evaluated given weight loss

## 2023-03-29 NOTE — ED Provider Notes (Addendum)
[facility] Provider Note    Event Date/Time   First MD Initiated Contact with Patient 03/29/23 1214     (approximate)   History   Weakness   HPI  Tanya Nguyen is a 58 y.o. female past medical history significant for acquired pyloric stricture, chronic pain, who presents to the emergency department with weakness.  Patient states that she has been having multiple episodes of nausea, vomiting and diarrhea over the past couple of weeks.  States that she has felt very weak and not her normal self.  Complaining of generalized weakness and fatigue.  Significant weight loss of approximately 11 pounds.  Denies any blood in her stool.  Denies fever or chills.  Denies dysuria, urinary urgency or frequency.  No recent antibiotic use.     Physical Exam   Triage Vital Signs: ED Triage Vitals  Encounter Vitals Group     BP 03/29/23 1056 (!) 116/106     Systolic BP Percentile --      Diastolic BP Percentile --      Pulse Rate 03/29/23 1056 (!) 124     Resp 03/29/23 1057 16     Temp 03/29/23 1056 98.2 F (36.8 C)     Temp Source 03/29/23 1056 Oral     SpO2 03/29/23 1056 94 %     Weight 03/29/23 1049 83 lb 15.9 oz (38.1 kg)     Height 03/29/23 1049 4\' 11"  (1.499 m)     Head Circumference --      Peak Flow --      Pain Score 03/29/23 1049 0     Pain Loc --      Pain Education --      Exclude from Growth Chart --     Most recent vital signs: Vitals:   03/29/23 1056 03/29/23 1057  BP: (!) 116/106   Pulse: (!) 124   Resp:  16  Temp: 98.2 F (36.8 C)   SpO2: 94%     Physical Exam Constitutional:      Appearance: She is well-developed.     Comments: Very thin female  HENT:     Head: Atraumatic.  Eyes:     Extraocular Movements: Extraocular movements intact.     Conjunctiva/sclera: Conjunctivae normal.     Pupils: Pupils are equal, round, and reactive to light.  Cardiovascular:     Rate and Rhythm: Regular rhythm. Tachycardia present.  Pulmonary:      Effort: No respiratory distress.  Abdominal:     General: There is no distension.     Tenderness: There is no abdominal tenderness.  Musculoskeletal:        General: Normal range of motion.     Cervical back: Normal range of motion.  Skin:    General: Skin is warm.     Capillary Refill: Capillary refill takes less than 2 seconds.  Neurological:     Mental Status: She is alert. Mental status is at baseline.     IMPRESSION / MDM / ASSESSMENT AND PLAN / ED COURSE  I reviewed the triage vital signs and the nursing notes.  Differential diagnosis including dehydration, viral gastroenteritis, viral illness including COVID/influenza, electrolyte abnormality, malignancy, hyperthyroidism  EKG  I, Corena Herter, the attending physician, personally viewed and interpreted this ECG.  Sinus tachycardia with a heart rate of 118.  Normal intervals.  No chamber enlargement.  No significant ST elevation or depression.  No findings of acute ischemia or dysrhythmia.  RADIOLOGY CT scan  abdomen and pelvis ordered  LABS (all labs ordered are listed, but only abnormal results are displayed) Labs interpreted as -    Labs Reviewed  BASIC METABOLIC PANEL - Abnormal; Notable for the following components:      Result Value   Sodium 125 (*)    Potassium 3.4 (*)    Chloride 88 (*)    Glucose, Bld 200 (*)    BUN <5 (*)    Calcium 8.4 (*)    All other components within normal limits  CBC - Abnormal; Notable for the following components:   Hemoglobin 15.3 (*)    MCHC 36.2 (*)    Platelets 421 (*)    All other components within normal limits  CBG MONITORING, ED - Abnormal; Notable for the following components:   Glucose-Capillary 194 (*)    All other components within normal limits  RESP PANEL BY RT-PCR (RSV, FLU A&B, COVID)  RVPGX2  TSH  T4, FREE  URINALYSIS, W/ REFLEX TO CULTURE (INFECTION SUSPECTED)  NA AND K (SODIUM & POTASSIUM), RAND UR     MDM  Patient found to have significant  hyponatremia with a sodium of 125 with a baseline of 136.  Patient's sodium in January was 136.  Does have hyperglycemia but normal anion gap.  Creatinine appears to be at her baseline.  Mildly low potassium at 3.4.  No significant leukocytosis.  Thyroid studies pending - concern for hyperthyroidism.  Ordered CT scan abdomen and pelvis given her ongoing episodes of nausea and vomiting with weight loss to further evaluate for malignancy.  Given 1 L of IV fluids, hyponatremia likely secondary to volume depletion given her vomiting and diarrhea.  CT scan ordered to further evaluate for possible malignancy.  Patient will need admitted for hyponatremia and generalized weakness.     PROCEDURES:  Critical Care performed: yes  .Ultrasound ED Peripheral IV (Provider)  Date/Time: 03/29/2023 1:57 PM  Performed by: Corena Herter, MD Authorized by: Corena Herter, MD   Procedure details:    Indications: multiple failed IV attempts     Skin Prep: chlorhexidine gluconate     Location:  Left AC   Angiocath:  20 G   Bedside Ultrasound Guided: Yes     Images: not archived     Patient tolerated procedure without complications: Yes     Dressing applied: Yes   .Critical Care  Performed by: Corena Herter, MD Authorized by: Corena Herter, MD   Critical care provider statement:    Critical care time (minutes):  30   Critical care time was exclusive of:  Separately billable procedures and treating other patients   Critical care was necessary to treat or prevent imminent or life-threatening deterioration of the following conditions:  Metabolic crisis   Critical care was time spent personally by me on the following activities:  Development of treatment plan with patient or surrogate, discussions with consultants, evaluation of patient's response to treatment, examination of patient, ordering and review of laboratory studies, ordering and review of radiographic studies, ordering and performing treatments and  interventions, pulse oximetry, re-evaluation of patient's condition and review of old charts   Patient's presentation is most consistent with acute presentation with potential threat to life or bodily function.   MEDICATIONS ORDERED IN ED: Medications  sodium chloride 0.9 % bolus 1,000 mL (1,000 mLs Intravenous New Bag/Given 03/29/23 1400)  ondansetron (ZOFRAN) injection 4 mg (4 mg Intravenous Given 03/29/23 1400)    FINAL CLINICAL IMPRESSION(S) / ED DIAGNOSES   Final diagnoses:  Weakness  Nausea vomiting and diarrhea  Hyponatremia     Rx / DC Orders   ED Discharge Orders     None        Note:  This document was prepared using Dragon voice recognition software and may include unintentional dictation errors.   Corena Herter, MD 03/29/23 1415    Corena Herter, MD 03/29/23 1427

## 2023-03-29 NOTE — Assessment & Plan Note (Signed)
Home duloxetine 60 mg nightly, omeprazole 2 mg nightly resumed on admission

## 2023-03-29 NOTE — Assessment & Plan Note (Signed)
Home Protonix 40 mg daily resumed

## 2023-03-29 NOTE — Assessment & Plan Note (Signed)
Serum magnesium is 1.5 on admission Supplement with magnesium sulfate 2 g IV one-time dose ordered on admission Rechecks magnesium level in the a.m.

## 2023-03-30 ENCOUNTER — Inpatient Hospital Stay: Payer: PPO | Admitting: Anesthesiology

## 2023-03-30 ENCOUNTER — Encounter: Payer: Self-pay | Admitting: Internal Medicine

## 2023-03-30 ENCOUNTER — Encounter
Admission: EM | Disposition: A | Discharge status: Home / Self Care | Payer: Self-pay | Source: Home / Self Care | Attending: Internal Medicine

## 2023-03-30 DIAGNOSIS — R195 Other fecal abnormalities: Secondary | ICD-10-CM

## 2023-03-30 DIAGNOSIS — K920 Hematemesis: Secondary | ICD-10-CM

## 2023-03-30 DIAGNOSIS — R110 Nausea: Secondary | ICD-10-CM

## 2023-03-30 DIAGNOSIS — K529 Noninfective gastroenteritis and colitis, unspecified: Secondary | ICD-10-CM | POA: Diagnosis not present

## 2023-03-30 DIAGNOSIS — K2970 Gastritis, unspecified, without bleeding: Secondary | ICD-10-CM

## 2023-03-30 DIAGNOSIS — E871 Hypo-osmolality and hyponatremia: Secondary | ICD-10-CM | POA: Diagnosis not present

## 2023-03-30 DIAGNOSIS — R112 Nausea with vomiting, unspecified: Secondary | ICD-10-CM

## 2023-03-30 HISTORY — PX: ESOPHAGOGASTRODUODENOSCOPY (EGD) WITH PROPOFOL: SHX5813

## 2023-03-30 LAB — BASIC METABOLIC PANEL
Anion gap: 8 (ref 5–15)
BUN: 10 mg/dL (ref 6–20)
CO2: 27 mmol/L (ref 22–32)
Calcium: 8 mg/dL — ABNORMAL LOW (ref 8.9–10.3)
Chloride: 96 mmol/L — ABNORMAL LOW (ref 98–111)
Creatinine, Ser: 0.61 mg/dL (ref 0.44–1.00)
GFR, Estimated: >60 mL/min (ref >60)
Glucose, Bld: 98 mg/dL (ref 70–99)
Potassium: 3.7 mmol/L (ref 3.5–5.1)
Sodium: 131 mmol/L — ABNORMAL LOW (ref 135–145)

## 2023-03-30 LAB — CBC
HCT: 40.1 % (ref 36.0–46.0)
Hemoglobin: 14.4 g/dL (ref 12.0–15.0)
MCH: 30.3 pg (ref 26.0–34.0)
MCHC: 35.9 g/dL (ref 30.0–36.0)
MCV: 84.4 fL (ref 80.0–100.0)
Platelets: 348 10*3/uL (ref 150–400)
RBC: 4.75 MIL/uL (ref 3.87–5.11)
RDW: 12.1 % (ref 11.5–15.5)
WBC: 9.7 10*3/uL (ref 4.0–10.5)
nRBC: 0 % (ref 0.0–0.2)

## 2023-03-30 LAB — MAGNESIUM: Magnesium: 2.3 mg/dL (ref 1.7–2.4)

## 2023-03-30 SURGERY — ESOPHAGOGASTRODUODENOSCOPY (EGD) WITH PROPOFOL
Anesthesia: General

## 2023-03-30 MED ORDER — EPHEDRINE 5 MG/ML INJ
INTRAVENOUS | Status: AC
Start: 2023-03-30 — End: ?
  Filled 2023-03-30: qty 15

## 2023-03-30 MED ORDER — PROPOFOL 10 MG/ML IV BOLUS
INTRAVENOUS | Status: DC | PRN
  Administered 2023-03-30 (×2): 30 mg via INTRAVENOUS

## 2023-03-30 MED ORDER — LIDOCAINE HCL (PF) 2 % IJ SOLN
INTRAMUSCULAR | Status: AC
  Filled 2023-03-30: qty 25

## 2023-03-30 MED ORDER — KETOROLAC TROMETHAMINE 30 MG/ML IJ SOLN
INTRAMUSCULAR | Status: AC
  Filled 2023-03-30: qty 2

## 2023-03-30 MED ORDER — DEXMEDETOMIDINE HCL IN NACL 80 MCG/20ML IV SOLN
INTRAVENOUS | Status: AC
  Filled 2023-03-30: qty 40

## 2023-03-30 MED ORDER — SODIUM CHLORIDE 0.9 % IV SOLN: INTRAVENOUS | Status: DC

## 2023-03-30 MED ORDER — GLYCOPYRROLATE 0.2 MG/ML IJ SOLN
INTRAMUSCULAR | Status: AC
  Filled 2023-03-30: qty 1

## 2023-03-30 MED ORDER — SODIUM CHLORIDE 0.9 % IV BOLUS
500.0000 mL | Freq: Once | INTRAVENOUS | Status: AC
  Administered 2023-03-30: 500 mL via INTRAVENOUS

## 2023-03-30 MED ORDER — PROPOFOL 500 MG/50ML IV EMUL
INTRAVENOUS | Status: DC | PRN
  Administered 2023-03-30: 75 ug/kg/min via INTRAVENOUS

## 2023-03-30 MED ORDER — LIDOCAINE HCL (CARDIAC) PF 100 MG/5ML IV SOSY
PREFILLED_SYRINGE | INTRAVENOUS | Status: DC | PRN
  Administered 2023-03-30: 40 mg via INTRAVENOUS

## 2023-03-30 NOTE — Transfer of Care (Signed)
Immediate Anesthesia Transfer of Care Note  Patient: Tanya Nguyen  Procedure(s) Performed: ESOPHAGOGASTRODUODENOSCOPY (EGD) WITH PROPOFOL  Patient Location: PACU  Anesthesia Type:General  Level of Consciousness: sedated  Airway & Oxygen Therapy: Patient Spontanous Breathing  Post-op Assessment: Report given to RN and Post -op Vital signs reviewed and stable  Post vital signs: Reviewed and stable  Last Vitals:  Vitals Value Taken Time  BP    Temp    Pulse    Resp    SpO2      Last Pain:  Vitals:   03/30/23 1352  TempSrc: Temporal  PainSc: 9          Complications: No notable events documented.

## 2023-03-30 NOTE — Anesthesia Preprocedure Evaluation (Signed)
Anesthesia Evaluation  Patient identified by MRN, date of birth, ID band Patient awake    Reviewed: Allergy & Precautions, H&P , NPO status , reviewed documented beta blocker date and time   History of Anesthesia Complications Negative for: history of anesthetic complications  Airway Mallampati: III   Neck ROM: full    Dental  (+) Chipped, Dental Advidsory Given   Pulmonary neg pulmonary ROS, neg sleep apnea, neg COPD   Pulmonary exam normal        Cardiovascular Exercise Tolerance: Good (-) angina (-) Past MI and (-) CABG negative cardio ROS Normal cardiovascular exam     Neuro/Psych  Headaches, neg Seizures PSYCHIATRIC DISORDERS Anxiety Depression Bipolar Disorder    Neuromuscular disease    GI/Hepatic Neg liver ROS, PUD,GERD  Medicated and Controlled,,Hx gastric outlet obstruction. + nausea vomiting, no vomiting since 3 days ago. Tolerated chicken last night without vomiting. No nausea today   Endo/Other  neg diabetesHypothyroidism    Renal/GU Renal disease (kidney stones)     Musculoskeletal   Abdominal   Peds  Hematology  (+) Blood dyscrasia, anemia   Anesthesia Other Findings Past Medical History: No date: Acquired pyloric stricture No date: Bleeding duodenal ulcer 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 No date: Depression No date: Duodenal obstruction 01/19/2018: Gastric outlet obstruction No date: GERD (gastroesophageal reflux disease) No date: History of stomach ulcers No date: Hypothyroidism No date: Intractable vomiting No date: Kidney stones No date: Migraine No date: Migraine headache     Comment:  only1-2x/month since starting topamax Past Surgical History: 2004: ABDOMINAL HYSTERECTOMY     Comment:  partial No date: CHOLECYSTECTOMY 02/27/2018: COLONOSCOPY WITH PROPOFOL; N/A     Comment:  Procedure: COLONOSCOPY WITH PROPOFOL;  Surgeon: Midge Minium, MD;  Location: [facility]  SURGERY CN[facility];  Service:               Endoscopy;  Laterality: N/A; 01/12/2018: ESOPHAGOGASTRODUODENOSCOPY (EGD) WITH PROPOFOL; N/A     Comment:  Procedure: ESOPHAGOGASTRODUODENOSCOPY (EGD) WITH               BIOPSIES;  Surgeon: Midge Minium, MD;  Location: [facility]               SURGERY CNTR;  Service: Endoscopy;  Laterality: N/A; 01/25/2018: ESOPHAGOGASTRODUODENOSCOPY (EGD) WITH PROPOFOL; N/A     Comment:  Procedure: ESOPHAGOGASTRODUODENOSCOPY (EGD) WITH               PROPOFOL;  Surgeon: Wyline Mood, MD;  Location[facility]               ENDOSCOPY;  Service: Gastroenterology;  Laterality: N/A; 02/27/2018: ESOPHAGOGASTRODUODENOSCOPY (EGD) WITH PROPOFOL; N/A     Comment:  Procedure: ESOPHAGOGASTRODUODENOSCOPY (EGD) WITH               PROPOFOL;  Surgeon: Midge Minium, MD;  Location: [facility]               SURGERY CNTR;  Service: Endoscopy;  Laterality: N/A; 01/20/2018: LAPAROTOMY; N/A     Comment:  Procedure: EXPLORATORY LAPAROTOMY;  Surgeon: Henrene Dodge, MD;  Location: [facility] ORS;  Service: General;                Laterality: N/A; No date: TOOTH EXTRACTION   Reproductive/Obstetrics  Anesthesia Physical Anesthesia Plan  ASA: 3  Anesthesia Plan: General   Post-op Pain Management: Minimal or no pain anticipated   Induction: Intravenous  PONV Risk Score and Plan: 3 and Propofol infusion and TIVA  Airway Management Planned: Nasal Cannula  Additional Equipment: None  Intra-op Plan:   Post-operative Plan:   Informed Consent: I have reviewed the patients History and Physical, chart, labs and discussed the procedure including the risks, benefits and alternatives for the proposed anesthesia with the patient or authorized representative who has indicated his/her understanding and acceptance.     Dental Advisory Given  Plan Discussed with: CRNA  Anesthesia Plan Comments: (Discussed risks of anesthesia with patient,  including possibility of difficulty with spontaneous ventilation under anesthesia necessitating airway intervention, PONV, and rare risks such as cardiac or respiratory or neurological events, and allergic reactions. Discussed the role of CRNA in patient's perioperative care. Patient understands.)        Anesthesia Quick Evaluation

## 2023-03-30 NOTE — ED Notes (Addendum)
Patient continues to rate abdominal pain 7 out of 10 after tylenol administration given at 0356. Patient states "its hurting really bag while holding abdomen". Notified Jawo NP who states she was "already prescribed oxy throughout the night, I told the other nurse I will not give anymore narcotics, her day MD will be here in an hour" through secure chat.

## 2023-03-30 NOTE — Op Note (Signed)
[facility] Patient Name: Tanya Nguyen Procedure Date: 03/30/2023 2:06 PM MRN: [id number] Account #: [id number] Date of Birth: Jun 30, 1965 Admit Type: Outpatient Age: 58 Room: [facility] ENDO ROOM 2 Gender: Female Note Status: Finalized Instrument Name: Patton Salles Endoscope [id number] Procedure:             Upper GI endoscopy Indications:           Nausea with vomiting Providers:             Midge Minium MD, MD Medicines:             Propofol per Anesthesia Complications:         No immediate complications. Procedure:             Pre-Anesthesia Assessment:                        - Prior to the procedure, a History and Physical was                         performed, and patient medications and allergies were                         reviewed. The patient's tolerance of previous                         anesthesia was also reviewed. The risks and benefits                         of the procedure and the sedation options and risks                         were discussed with the patient. All questions were                         answered, and informed consent was obtained. Prior                         Anticoagulants: The patient has taken no anticoagulant                         or antiplatelet agents. ASA Grade Assessment: II - A                         patient with mild systemic disease. After reviewing                         the risks and benefits, the patient was deemed in                         satisfactory condition to undergo the procedure.                        After obtaining informed consent, the endoscope was                         passed under direct vision. Throughout the procedure,                         the patient's  blood pressure, pulse, and oxygen                         saturations were monitored continuously. The Endoscope                         was introduced through the mouth, and advanced to the                         jejunum. The upper  GI endoscopy was accomplished                         without difficulty. The patient tolerated the                         procedure well. Findings:      The examined esophagus was normal.      Evidence of a gastrojejunostomy was found in the gastric body.      Moderate inflammation characterized by erythema was found in the entire       examined stomach.      The jejunum contained a foreign body (large amount of food). Impression:            - Normal esophagus.                        - A gastrojejunostomy was found.                        - Gastritis.                        - Foreign body in the jejunum. Not removed.                        - No specimens collected. Recommendation:        - Perform an upper GI series and small bowel follow                         through. Procedure Code(s):     --- Professional ---                        [phone], Esophagogastroduodenoscopy, flexible,                         transoral; diagnostic, including collection of                         specimen(s) by brushing or washing, when performed                         (separate procedure) Diagnosis Code(s):     --- Professional ---                        R11.2, Nausea with vomiting, unspecified                        K29.70, Gastritis, unspecified, without bleeding CPT copyright 2022 [employer]. All rights reserved. The codes documented in this report are preliminary and upon coder review may  be revised  to meet current compliance requirements. Midge Minium MD, MD 03/30/2023 2:49:41 PM This report has been signed electronically. Number of Addenda: 0 Note Initiated On: 03/30/2023 2:06 PM Estimated Blood Loss:  Estimated blood loss: none.      [facility]

## 2023-03-30 NOTE — ED Notes (Signed)
Hospitalist messaged due to pt being tachycardic. See orders.

## 2023-03-30 NOTE — Consult Note (Signed)
Midge Minium, MD [facility]  [address] [place], [place] [address] Phone: [phone] Fax : [phone]  Consultation  Referring Provider:     Dr. Sedalia Muta Primary Care Physician:  Doreene Nest, NP Primary Gastroenterologist: [facility]         Reason for Consultation:     Nausea and vomiting  Date of Admission:  03/29/2023 Date of Consultation:  03/30/2023         HPI:   Tanya Nguyen is a 58 y.o. female who had seen me in the past with abuse of BCs and a gastric outlet obstruction with recurrent ulcers.  The patient underwent surgery and has reported stopping all of her NSAIDs.  The patient also reports that she has had diarrhea for many months.  She also reports that sometimes she has accidents.  The patient was admitted with hyponatremia which has been corrected since admission.  The patient reports that she has abdominal pain in the lower abdomen.  There is no report of any black stools or bloody stools.  She reports that her vomitus is mostly with food but has had some coffee ground emesis.  She also reports that she has been having some pencil thin stools in addition to her diarrhea.  The patient had a colonoscopy in 2019 with a poor prep and was sent home with recommendations to have a repeat colonoscopy but due to her gastric problems was unable to take the prep.  Past Medical History:  Diagnosis Date   Acquired pyloric stricture    Bleeding duodenal ulcer    CAP (community acquired pneumonia) 02/12/2020   Chronic esophagogastric ulcer    Constipation 04/02/2014   Depression    Duodenal obstruction    Gastric outlet obstruction 01/19/2018   GERD (gastroesophageal reflux disease)    Hair loss 09/22/2018   Head injury 12/24/2022   History of stomach ulcers    Hypothyroidism    Incisional hernia, without obstruction or gangrene    Intractable vomiting    Kidney stones    Melena 05/08/2021   Migraine    Migraine headache    only1-2x/month since starting  topamax   Neuropathy    Nodule of middle lobe of right lung 09/17/2021   NSAID long-term use 09/16/2016   Right-sided chest pain 09/17/2021   Sacroiliac joint pain 08/14/2018   Side pain 03/30/2018   Upper GI bleed 05/18/2021    Past Surgical History:  Procedure Laterality Date   ABDOMINAL HYSTERECTOMY  2004   partial   CHOLECYSTECTOMY     COLONOSCOPY WITH PROPOFOL N/A 02/27/2018   Procedure: COLONOSCOPY WITH PROPOFOL;  Surgeon: Midge Minium, MD;  Location: [facility];  Service: Endoscopy;  Laterality: N/A;   ESOPHAGOGASTRODUODENOSCOPY (EGD) WITH PROPOFOL N/A 01/12/2018   Procedure: ESOPHAGOGASTRODUODENOSCOPY (EGD) WITH BIOPSIES;  Surgeon: Midge Minium, MD;  Location: [facility];  Service: Endoscopy;  Laterality: N/A;   ESOPHAGOGASTRODUODENOSCOPY (EGD) WITH PROPOFOL N/A 01/25/2018   Procedure: ESOPHAGOGASTRODUODENOSCOPY (EGD) WITH PROPOFOL;  Surgeon: Wyline Mood, MD;  Location: [facility] END[facility]COPY;  Service: Gastroenterology;  Laterality: N/A;   ESOPHAGOGASTRODUODENOSCOPY (EGD) WITH PROPOFOL N/A 02/27/2018   Procedure: ESOPHAGOGASTRODUODENOSCOPY (EGD) WITH PROPOFOL;  Surgeon: Midge Minium, MD;  Location: [facility];  Service: Endoscopy;  Laterality: N/A;   ESOPHAGOGASTRODUODENOSCOPY (EGD) WITH PROPOFOL N/A 05/09/2021   Procedure: ESOPHAGOGASTRODUODENOSCOPY (EGD) WITH PROPOFOL;  Surgeon: Jaynie Collins, DO;  Location: [facility] END[facility];  Service: Gastroenterology;  Laterality: N/A;   ESOPHAGOGASTRODUODENOSCOPY (EGD) WITH PROPOFOL N/A 03/09/2022  Procedure: ESOPHAGOGASTRODUODENOSCOPY (EGD) WITH PROPOFOL;  Surgeon: Midge Minium, MD;  Location: [facility] ENDOSCOPY;  Service: Endoscopy;  Laterality: N/A;   INCISIONAL HERNIA REPAIR N/A 11/23/2018   Procedure: HERNIA REPAIR INCISIONAL;  Surgeon: Henrene Dodge, MD;  Location: [facility] ORS;  Service: General;  Laterality: N/A;   INSERTION OF MESH N/A 11/23/2018   Procedure: INSERTION OF MESH;  Surgeon: Henrene Dodge, MD;  Location:  [facility] OR[facility];  Service: General;  Laterality: N/A;   LAPAROTOMY N/A 01/20/2018   Procedure: EXPLORATORY LAPAROTOMY;  Surgeon: Henrene Dodge, MD;  Location: [facility] ORS;  Service: General;  Laterality: N/A;   TOOTH EXTRACTION      Prior to Admission medications   Medication Sig Start Date End Date Taking? Authorizing Provider  ARIPiprazole (ABILIFY) 2 MG tablet Take 1 tablet (2 mg total) by mouth at bedtime. 01/31/23 05/01/23 Yes Hisada, Barbee Cough, MD  DULoxetine (CYMBALTA) 60 MG capsule TAKE 1 CAPSULE (60 MG TOTAL) BY MOUTH DAILY. FOR ANXIETY AND PAIN. 03/25/22  Yes Doreene Nest, NP  hydrOXYzine (ATARAX) 25 MG tablet Take 0.5-1 tablets (12.5-25 mg total) by mouth at bedtime as needed for anxiety. 03/03/23 04/02/23 Yes Arfeen, Phillips Grout, MD  levothyroxine (SYNTHROID) 75 MCG tablet Take 1 tablet by mouth every morning on an empty stomach with water only.  No food or other medications for 30 minutes. 03/18/23  Yes Doreene Nest, NP  mirtazapine (REMERON) 15 MG tablet 7.5 mg at night for one week, then 15 mg at night 03/10/23  Yes Hisada, Reina, MD  oxybutynin (DITROPAN-XL) 5 MG 24 hr tablet TAKE 1 TABLET (5 MG TOTAL) BY MOUTH AT BEDTIME. FOR BLADDER INCONTINENCE. 01/21/23  Yes Doreene Nest, NP  pantoprazole (PROTONIX) 40 MG tablet Take 1 tablet (40 mg total) by mouth daily. 03/29/23  Yes Midge Minium, MD  SUMAtriptan (IMITREX) 50 MG tablet Take 1 tablet at migraine onset. May repeat in 2 hours if headache persists or recurs. 03/08/23  Yes Doreene Nest, NP  traMADol (ULTRAM) 50 MG tablet Take 1 tablet (50 mg total) by mouth every 12 (twelve) hours as needed. 02/01/23  Yes Edward Jolly, MD  doxepin (SINEQUAN) 10 MG capsule Take 1 capsule (10 mg total) by mouth at bedtime as needed (insomnia). Patient not taking: Reported on 03/17/2023 03/10/23 05/09/23  Neysa Hotter, MD  DULoxetine (CYMBALTA) 30 MG capsule TAKE 1 CAPSULE (30 MG TOTAL) BY MOUTH DAILY. FOR ANXIETY AND PAIN. TAKE WITH 60 MG. Patient not  taking: Reported on 03/17/2023 11/09/22   Doreene Nest, NP    Family History  Problem Relation Age of Onset   Cancer Mother    Breast cancer Mother 30   Lung cancer Father    Drug abuse Sister    Alcohol abuse Sister    Bipolar disorder Sister    Alcohol abuse Maternal Grandmother    Stroke Maternal Grandmother    Stroke Paternal Grandmother      Social History   Tobacco Use   Smoking status: Never   Smokeless tobacco: Never  Vaping Use   Vaping status: Never Used  Substance Use Topics   Alcohol use: Not Currently    Alcohol/week: 0.0 standard drinks of alcohol   Drug use: No    Allergies as of 03/29/2023 - Review Complete 03/29/2023  Allergen Reaction Noted   Penicillins Shortness Of Breath, Diarrhea, and Nausea And Vomiting 02/26/2015   Nsaids Other (See Comments) 06/26/2012   Gabapentin Other (See Comments) 11/14/2018   Pregabalin  04/21/2020   Sulfa  antibiotics Nausea And Vomiting 06/26/2012    Review of Systems:    All systems reviewed and negative except where noted in HPI.   Physical Exam:  Vital signs in last 24 hours: Temp:  [97.6 F (36.4 C)-98.4 F (36.9 C)] 97.6 F (36.4 C) (01/22 0314) Pulse Rate:  [92-113] 109 (01/22 1230) Resp:  [12-24] 24 (01/22 1130) BP: (116-151)/(76-96) 133/96 (01/22 1230) SpO2:  [92 %-100 %] 100 % (01/22 1230) Last BM Date : 03/29/23 General:   Pleasant, cooperative in NAD Head:  Normocephalic and atraumatic. Eyes:   No icterus.   Conjunctiva pink. PERRLA. Ears:  Normal auditory acuity. Neck:  Supple; no masses or thyroidomegaly Lungs: Respirations even and unlabored. Lungs clear to auscultation bilaterally.   No wheezes, crackles, or rhonchi.  Heart:  Regular rate and rhythm;  Without murmur, clicks, rubs or gallops Abdomen:  Soft, nondistended, nontender. Normal bowel sounds. No appreciable masses or hepatomegaly.  No rebound or guarding.  Rectal:  Not performed. Msk:  Symmetrical without gross deformities.     Extremities:  Without edema, cyanosis or clubbing. Neurologic:  Alert and oriented x3;  grossly normal neurologically. Skin:  Intact without significant lesions or rashes. Cervical Nodes:  No significant cervical adenopathy. Psych:  Alert and cooperative. Normal affect.  LAB RESULTS: Recent Labs    03/29/23 1051 03/30/23 0317  WBC 6.1 9.7  HGB 15.3* 14.4  HCT 42.3 40.1  PLT 421* 348   BMET Recent Labs    03/29/23 1051 03/29/23 2258 03/30/23 0317  NA 125* 129* 131*  K 3.4*  --  3.7  CL 88*  --  96*  CO2 25  --  27  GLUCOSE 200*  --  98  BUN <5*  --  10  CREATININE 0.87  --  0.61  CALCIUM 8.4*  --  8.0*   LFT Recent Labs    03/29/23 1051  PROT 5.7*  ALBUMIN 2.9*  AST 47*  ALT 28  ALKPHOS 79  BILITOT 0.9  BILIDIR 0.2  IBILI 0.7   PT/INR No results for input(s): "LABPROT", "INR" in the last 72 hours.  STUDIES: CT ABDOMEN PELVIS W CONTRAST Result Date: 03/29/2023 CLINICAL DATA:  Weakness for several weeks, vomiting, chills EXAM: CT ABDOMEN AND PELVIS WITH CONTRAST TECHNIQUE: Multidetector CT imaging of the abdomen and pelvis was performed using the standard protocol following bolus administration of intravenous contrast. RADIATION DOSE REDUCTION: This exam was performed according to the departmental dose-optimization program which includes automated exposure control, adjustment of the mA and/or kV according to patient size and/or use of iterative reconstruction technique. CONTRAST:  OMNIPAQUE IOHEXOL 300 MG/ML  SOLN COMPARISON:  09/21/2021 FINDINGS: Lower chest: No acute pleural or parenchymal lung disease. Hepatobiliary: Cholecystectomy. Simple appearing cysts within the left and right lobes of the liver are stable and do not require follow-up. The remainder of the liver is unremarkable. No biliary duct dilation. Pancreas: Unremarkable. No pancreatic ductal dilatation or surrounding inflammatory changes. Spleen: Normal in size without focal abnormality.  Adrenals/Urinary Tract: There are bilateral nonobstructing less than 3 mm renal calculi, not appreciably changed since prior exam. No evidence of obstructive uropathy. Bladder is moderately distended without focal abnormality. The adrenals are normal. Stomach/Bowel: Postsurgical changes from prior gastrojejunostomy and jejunojejunostomy, with likely functional dilatation of the efferent jejunostomy loop and unchanged since prior CT and upper GI studies. No evidence of bowel obstruction or ileus. The appendix, if still present, is not well visualized. No bowel wall thickening or inflammatory change. Vascular/Lymphatic:  Aortic atherosclerosis. No enlarged abdominal or pelvic lymph nodes. Reproductive: Uterus and bilateral adnexa are unremarkable. Other: No free fluid or free intraperitoneal gas. No abdominal wall hernia. Musculoskeletal: No acute or destructive bony abnormalities. Reconstructed images demonstrate no additional findings. IMPRESSION: 1. Prior gastrojejunostomy and jejunojejunostomy, stable in appearance. 2. Multiple punctate bilateral nonobstructing renal calculi. No obstructive uropathy within either kidney. 3. Otherwise no acute intra-abdominal or intrapelvic process. 4.  Aortic Atherosclerosis (ICD10-I70.0). Electronically Signed   By: Sharlet Salina M.D.   On: 03/29/2023 15:17      Impression / Plan:   Assessment: Principal Problem:   Hyponatremia Active Problems:   GERD (gastroesophageal reflux disease)   Hypothyroidism   Depression   Weight loss   Insomnia   Weakness   Bipolar I disorder, most recent episode depressed (HCC)   Protein-calorie malnutrition, severe   Chronic pain syndrome   Hx of bypass gastrojejunostomy   Iron deficiency anemia   Anxiety   Hypomagnesemia   Coffee ground emesis   Hypokalemia   Thrombocytosis   Tanya Nguyen is a 58 y.o. y/o female with with nausea and vomiting for 2 months with some hematemesis.  The patient also has had a change in bowel  habits with chronic diarrhea and change in stool caliber.    Plan:  The patient will be set up for a upper endoscopy due to the hematemesis and the patient could follow-up as an outpatient for her repeat colonoscopy.  Her change in bowel habits have been chronic for the last 6 months and with her nausea vomiting I do not think that doing a prep while in the hospital is going to be beneficial.  The patient has been explained the plan agrees with it.  Thank you for involving me in the care of this patient.      LOS: 1 day   Midge Minium, MD, MD. Clementeen Graham 03/30/2023, 1:03 PM,  Pager [phone] 7am-5pm  Check AMION for 5pm -7am coverage and on weekends   Note: This dictation was prepared with Dragon dictation along with smaller phrase technology. Any transcriptional errors that result from this process are unintentional.

## 2023-03-30 NOTE — Progress Notes (Addendum)
Progress Note   Patient: Tanya Nguyen XLK:[id number] DOB: 08-07-1965 DOA: 03/29/2023     1 DOS: the patient was seen and examined on 03/30/2023   Brief hospital course:  Tanya Nguyen is a 58 year old famale with history of hypothyroid, depression, anxiety, GERD, who presented on 03/29/2023 for evaluation of progressive generalized weakness for several weeks, unintentional weight loss over 2 months, and recurrent episodes of nausea/vomiting with coffee-ground emesis at times, and limited PO intake as a result.    ED evaluation revealed hyponatremia with sodium 125, potassium 3.4, no leukocytosis.  Pt was tachycardic with HR 124, afebrile.   See H&P for full HPI on admission & ED course.  Pt was admitted for further evaluation and management with GI consulted.  EGD on 1/22 showed gastritis, gastrojejunostomy noted, foreign body in jejunum that was not removed endoscopically.    Further hospital course and management as outlined below.    Assessment and Plan:  Hyponatremia - Acute, hypovolemic. Na 125 on admission, down from 136 on 03/17/23. Started on IV fluids on admission and Na improving. Na trend: 125 >> 129 >> 131 --Continue IV fluids --Serial BMP's to monitor --Treat nausea/vomiting --GI evaluation as outlined  Thrombocytosis, polycythemia Suspect volume contraction in setting of GI illness. --Monitor CBC  Hypokalemia Hypomagnesemia K and Mg were replaced, now normalized --Monitor BMP, Mg levels --Replace K, Mg as needed  Coffee ground emesis --GI is following --EGD on 1/22 showed gastritis and a foreign object in the jejunum that was not endoscopically removed. --Upper GI series with small bowel follow through recommended --IV PPI BID --IV fluids until tolerating PO intake --Antiemetics IV PRN  Generalized Weakness - due to electrolyte derangements, poor PO intake, GI illness --Mgmt of underlying issues as outlined --PT evaluation if needed  Difficulty  voiding --Hold home oxybutynin --Bladder scans to monitor for retention --On IV fluids as above  Anxiety --Home hydroxyzine PRN  Chronic pain syndrome --Home tramadol resumed  Protein-calorie malnutrition, severe --Registered dietitian consulted, appreciate recommendations  Insomnia --Melatonin PRN  Weight loss Recommend close outpatient follow-up with PCP and GI provider as appropriate. Pencil thin stools reported. Pt unsure when due to next screening colonoscopy GI following as above.  Depression --Home duloxetine, Abilify   Hypothyroidism Outpatient TSH from 03/11/2023 and 03/17/2023 were 0.07 and 0.04 respectively --Holding home levothyroxine 75 mcg for now --PCP follow up  GERD (gastroesophageal reflux disease) --Hold home Protonix 40 mg daily  --On IV PPI BID currently       Subjective: Pt seen in the ED holding for a bed today, before EGD. She continues to report nausea but not actively vomiting.  No other acute complaints.   Physical Exam: Vitals:   03/30/23 1503 03/30/23 1518 03/30/23 1619 03/30/23 1736  BP: 137/79 (!) 141/76 139/80 (!) 143/83  Pulse:    89  Resp:      Temp:    97.8 F (36.6 C)  TempSrc:      SpO2:    99%  Weight:      Height:       General exam: awake, alert, no acute distress, underweight HEENT: moist mucus membranes, hearing grossly normal  Respiratory system: CTA, no wheezes, rales or rhonchi, normal respiratory effort. Cardiovascular system: normal S1/S2, RRR, no JVD, murmurs, rubs, gallops, no pedal edema.   Gastrointestinal system: soft, NT, ND, no HSM felt, +bowel sounds. Central nervous system: A&O x 3. no gross focal neurologic deficits, normal speech Extremities: moves all, no edema, normal  tone Skin: dry, intact, normal temperature Psychiatry: normal mood, congruent affect, judgement and insight appear normal   Data Reviewed:  Notable labs --  Na improved 131, Cl 96, Ca 8.0  Family Communication: None present. Pt  is able to update.  Disposition: Status is: Inpatient Remains inpatient appropriate because: Ongoing evaluation per GI as above   Planned Discharge Destination: Home    Time spent: 45 minutes  Author: Pennie Banter, DO 03/30/2023 5:59 PM  For on call review [email or web address].

## 2023-03-31 ENCOUNTER — Inpatient Hospital Stay: Payer: PPO

## 2023-03-31 ENCOUNTER — Encounter: Payer: Self-pay | Admitting: Gastroenterology

## 2023-03-31 DIAGNOSIS — Z8719 Personal history of other diseases of the digestive system: Secondary | ICD-10-CM

## 2023-03-31 DIAGNOSIS — K2970 Gastritis, unspecified, without bleeding: Secondary | ICD-10-CM | POA: Diagnosis not present

## 2023-03-31 DIAGNOSIS — R112 Nausea with vomiting, unspecified: Secondary | ICD-10-CM

## 2023-03-31 DIAGNOSIS — E871 Hypo-osmolality and hyponatremia: Secondary | ICD-10-CM | POA: Diagnosis not present

## 2023-03-31 LAB — BASIC METABOLIC PANEL
Anion gap: 11 (ref 5–15)
BUN: 10 mg/dL (ref 6–20)
CO2: 24 mmol/L (ref 22–32)
Calcium: 7.7 mg/dL — ABNORMAL LOW (ref 8.9–10.3)
Chloride: 99 mmol/L (ref 98–111)
Creatinine, Ser: 0.41 mg/dL — ABNORMAL LOW (ref 0.44–1.00)
GFR, Estimated: >60 mL/min (ref >60)
Glucose, Bld: 72 mg/dL (ref 70–99)
Potassium: 4.4 mmol/L (ref 3.5–5.1)
Sodium: 134 mmol/L — ABNORMAL LOW (ref 135–145)

## 2023-03-31 NOTE — Progress Notes (Signed)
Tanya Minium, MD [facility]   [address], [address] [place], [place] [address] Phone: [phone] Fax : [phone]   Subjective: The patient reports that she is feeling much better today.  She says that she was feeling weak when her sodium was low.  She also reports that her nausea vomiting has been intermittent but she usually tolerates food well.  I looked at the small bowel follow-through from the 10th and again repeated today.  The results are not yet back but does not appear to show any obstruction.  She reports that her nausea vomiting is very intermittent and she tolerates solid foods mostly time.   Objective: Vital signs in last 24 hours: Vitals:   03/31/23 0013 03/31/23 0443 03/31/23 0500 03/31/23 0842  BP: (!) 141/86 138/77  138/72  Pulse: 96 97  92  Resp: 20 19 15 18   Temp: 98 F (36.7 C) 98.1 F (36.7 C)  98.1 F (36.7 C)  TempSrc: Oral Oral  Oral  SpO2: 99% 99%  100%  Weight:      Height:       Weight change:   Intake/Output Summary (Last 24 hours) at 03/31/2023 1413 Last data filed at 03/31/2023 0700 Gross per 24 hour  Intake 473.98 ml  Output 0 ml  Net 473.98 ml     Exam: Heart:: Regular rate and rhythm or without murmur or extra heart sounds Lungs: normal and clear to auscultation and percussion Abdomen: soft, nontender, normal bowel sounds   Lab Results: @LABTEST2 @ Micro Results: Recent Results (from the past 240 hours)  Resp panel by RT-PCR (RSV, Flu A&B, Covid) Anterior Nasal Swab     Status: None   Collection Time: 03/29/23 10:51 AM   Specimen: Anterior Nasal Swab  Result Value Ref Range Status   SARS Coronavirus 2 by RT PCR NEGATIVE NEGATIVE Final    Comment: (NOTE) SARS-CoV-2 target nucleic acids are NOT DETECTED.  The SARS-CoV-2 RNA is generally detectable in upper respiratory specimens during the acute phase of infection. The lowest concentration of SARS-CoV-2 viral copies this assay can detect is 138 copies/mL. A negative result does  not preclude SARS-Cov-2 infection and should not be used as the sole basis for treatment or other patient management decisions. A negative result may occur with  improper specimen collection/handling, submission of specimen other than nasopharyngeal swab, presence of viral mutation(s) within the areas targeted by this assay, and inadequate number of viral copies(<138 copies/mL). A negative result must be combined with clinical observations, patient history, and epidemiological information. The expected result is Negative.  Fact Sheet for Patients:  BloggerCourse.com  Fact Sheet for Healthcare Providers:  SeriousBroker.it  This test is no t yet approved or cleared by the [place] FDA and  has been authorized for detection and/or diagnosis of SARS-CoV-2 by FDA under an Emergency Use Authorization (EUA). This EUA will remain  in effect (meaning this test can be used) for the duration of the COVID-19 declaration under Section 564(b)(1) of the Act, 21 U.S.C.section 360bbb-3(b)(1), unless the authorization is terminated  or revoked sooner.       Influenza A by PCR NEGATIVE NEGATIVE Final   Influenza B by PCR NEGATIVE NEGATIVE Final    Comment: (NOTE) The Xpert Xpress SARS-CoV-2/FLU/RSV plus assay is intended as an aid in the diagnosis of influenza from Nasopharyngeal swab specimens and should not be used as a sole basis for treatment. Nasal washings and aspirates are unacceptable for Xpert Xpress SARS-CoV-2/FLU/RSV testing.  Fact Sheet for  Patients: BloggerCourse.com  Fact Sheet for Healthcare Providers: SeriousBroker.it  This test is not yet approved or cleared by the [place] FDA and has been authorized for detection and/or diagnosis of SARS-CoV-2 by FDA under an Emergency Use Authorization (EUA). This EUA will remain in effect (meaning this test can be used) for the  duration of the COVID-19 declaration under Section 564(b)(1) of the Act, 21 U.S.C. section 360bbb-3(b)(1), unless the authorization is terminated or revoked.     Resp Syncytial Virus by PCR NEGATIVE NEGATIVE Final    Comment: (NOTE) Fact Sheet for Patients: BloggerCourse.com  Fact Sheet for Healthcare Providers: SeriousBroker.it  This test is not yet approved or cleared by the [place] FDA and has been authorized for detection and/or diagnosis of SARS-CoV-2 by FDA under an Emergency Use Authorization (EUA). This EUA will remain in effect (meaning this test can be used) for the duration of the COVID-19 declaration under Section 564(b)(1) of the Act, 21 U.S.C. section 360bbb-3(b)(1), unless the authorization is terminated or revoked.  Performed at [facility], [address] [place], [place] [address]    Studies/Results: CT ABDOMEN PELVIS W CONTRAST Result Date: 03/29/2023 CLINICAL DATA:  Weakness for several weeks, vomiting, chills EXAM: CT ABDOMEN AND PELVIS WITH CONTRAST TECHNIQUE: Multidetector CT imaging of the abdomen and pelvis was performed using the standard protocol following bolus administration of intravenous contrast. RADIATION DOSE REDUCTION: This exam was performed according to the departmental dose-optimization program which includes automated exposure control, adjustment of the mA and/or kV according to patient size and/or use of iterative reconstruction technique. CONTRAST:  OMNIPAQUE IOHEXOL 300 MG/ML  SOLN COMPARISON:  09/21/2021 FINDINGS: Lower chest: No acute pleural or parenchymal lung disease. Hepatobiliary: Cholecystectomy. Simple appearing cysts within the left and right lobes of the liver are stable and do not require follow-up. The remainder of the liver is unremarkable. No biliary duct dilation. Pancreas: Unremarkable. No pancreatic ductal dilatation or surrounding inflammatory changes.  Spleen: Normal in size without focal abnormality. Adrenals/Urinary Tract: There are bilateral nonobstructing less than 3 mm renal calculi, not appreciably changed since prior exam. No evidence of obstructive uropathy. Bladder is moderately distended without focal abnormality. The adrenals are normal. Stomach/Bowel: Postsurgical changes from prior gastrojejunostomy and jejunojejunostomy, with likely functional dilatation of the efferent jejunostomy loop and unchanged since prior CT and upper GI studies. No evidence of bowel obstruction or ileus. The appendix, if still present, is not well visualized. No bowel wall thickening or inflammatory change. Vascular/Lymphatic: Aortic atherosclerosis. No enlarged abdominal or pelvic lymph nodes. Reproductive: Uterus and bilateral adnexa are unremarkable. Other: No free fluid or free intraperitoneal gas. No abdominal wall hernia. Musculoskeletal: No acute or destructive bony abnormalities. Reconstructed images demonstrate no additional findings. IMPRESSION: 1. Prior gastrojejunostomy and jejunojejunostomy, stable in appearance. 2. Multiple punctate bilateral nonobstructing renal calculi. No obstructive uropathy within either kidney. 3. Otherwise no acute intra-abdominal or intrapelvic process. 4.  Aortic Atherosclerosis (ICD10-I70.0). Electronically Signed   By: Sharlet Salina M.D.   On: 03/29/2023 15:17   Medications: I have reviewed the patient's current medications. Scheduled Meds:  ARIPiprazole  2 mg Oral QHS   DULoxetine  60 mg Oral QHS   feeding supplement  1 Container Oral TID BM   multivitamin with minerals  1 tablet Oral Daily   Continuous Infusions: PRN Meds:.acetaminophen **OR** acetaminophen, hydrOXYzine, melatonin, ondansetron **OR** ondansetron (ZOFRAN) IV, traMADol   Assessment: Principal Problem:   Hyponatremia Active Problems:   GERD (gastroesophageal reflux disease)   Hypothyroidism  Depression   Weight loss   Insomnia   Weakness    Bipolar I disorder, most recent episode depressed (HCC)   Protein-calorie malnutrition, severe   Chronic pain syndrome   Hx of bypass gastrojejunostomy   Iron deficiency anemia   Anxiety   Hypomagnesemia   Coffee ground emesis   Hypokalemia   Thrombocytosis   Nausea vomiting and diarrhea    Plan: Who has had an EGD with no blockage seen but retained food in the small bowel.  The patient also had diffuse gastritis and an obvious gastrojejunostomy.  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  She is no longer taking NSAIDs as reported by her.  The patient small bowel follow-through did not show any sign of obstruction.  She was offered a soft diet but states that she tolerates solid food well and reports feeling back to her baseline now that her sodium has been replaced.  She will be started on a regular diet and may be discharged home from GI point of view if she tolerates the diet.  The cause of her low sodium on admission is unclear.  The patient has been explained the plan and agrees with it.   LOS: 2 days   Tanya Minium, MD.FACG 03/31/2023, 2:13 PM Pager [phone] 7am-5pm  Check AMION for 5pm -7am coverage and on weekends

## 2023-03-31 NOTE — Progress Notes (Signed)
Progress Note   Patient: Tanya Nguyen GEX:[id number] DOB: 25-Aug-1965 DOA: 03/29/2023     2 DOS: the patient was seen and examined on 03/31/2023   Brief hospital course:  [name] is a 58 year old famale with history of hypothyroid, depression, anxiety, GERD, who presented on 03/29/2023 for evaluation of progressive generalized weakness for several weeks, unintentional weight loss over 2 months, and recurrent episodes of nausea/vomiting with coffee-ground emesis at times, and limited PO intake as a result.    ED evaluation revealed hyponatremia with sodium 125, potassium 3.4, no leukocytosis.  Pt was tachycardic with HR 124, afebrile.   See H&P for full HPI on admission & ED course.  Pt was admitted for further evaluation and management with GI consulted.  EGD on 1/22 showed gastritis, gastrojejunostomy noted, foreign body in jejunum that was not removed endoscopically.    Further hospital course and management as outlined below.    Assessment and Plan:  Hyponatremia - Acute, hypovolemic. Na 125 on admission, down from 136 on 03/17/23. Started on IV fluids on admission and Na improving. Na trend: 125 >> 129 >> 131 >> 134 --Continue IV fluids until tolerating PO intake --Serial BMP's to monitor --Treat nausea/vomiting --GI evaluation as outlined  Thrombocytosis, polycythemia Suspect volume contraction in setting of GI illness. --Monitor CBC  Hypokalemia Hypomagnesemia K and Mg were replaced, now normalized --Monitor BMP, Mg levels --Replace K, Mg as needed  Coffee ground emesis --GI is following --EGD on 1/22 showed gastritis and a foreign object in the jejunum that was not endoscopically removed. --Upper GI series with small bowel follow through recommended -- results pending --IV PPI BID --IV fluids until tolerating PO intake --Antiemetics IV PRN --Diet resumed per GI  Generalized Weakness - due to electrolyte derangements, poor PO intake, GI illness --Mgmt of  underlying issues as outlined --PT evaluation if needed  Difficulty voiding --Hold home oxybutynin --Bladder scans to monitor for retention --On IV fluids as above  Anxiety --Home hydroxyzine PRN  Chronic pain syndrome --Home tramadol resumed  Protein-calorie malnutrition, severe --Registered dietitian consulted, appreciate recommendations  Insomnia --Melatonin PRN  Weight loss Recommend close outpatient follow-up with PCP and GI provider as appropriate. Pencil thin stools reported. Pt unsure when due to next screening colonoscopy GI following as above.  Depression --Home duloxetine, Abilify   Hypothyroidism Outpatient TSH from 03/11/2023 and 03/17/2023 were 0.07 and 0.04 respectively --Holding home levothyroxine 75 mcg for now --PCP follow up  GERD (gastroesophageal reflux disease) --Hold home Protonix 40 mg daily  --On IV PPI BID currently       Subjective: Pt seen awake resting in bed today.  Is NPO for study this AM and denies nausea/vomiting.  Symptoms strictly related to attempts to eat.  No other complaints.   Physical Exam: Vitals:   03/31/23 0013 03/31/23 0443 03/31/23 0500 03/31/23 0842  BP: (!) 141/86 138/77  138/72  Pulse: 96 97  92  Resp: 20 19 15 18   Temp: 98 F (36.7 C) 98.1 F (36.7 C)  98.1 F (36.7 C)  TempSrc: Oral Oral  Oral  SpO2: 99% 99%  100%  Weight:      Height:       General exam: awake, alert, no acute distress, underweight HEENT: moist mucus membranes, hearing grossly normal  Respiratory system: on room air, normal respiratory effort. Cardiovascular system: normal S1/S2, RRR, no pedal edema.   Gastrointestinal system: soft, NT, ND Central nervous system: A&O x 3. no gross focal neurologic deficits,  normal speech Extremities: moves all, no edema, normal tone Skin: dry, intact, normal temperature Psychiatry: normal mood, congruent affect, judgement and insight appear normal   Data Reviewed:  Notable labs --  Na improved  131>> 134 Cr 0.41 Ca 7.7  Family Communication: None present. Pt is able to update.  Disposition: Status is: Inpatient Remains inpatient appropriate because: Ongoing evaluation per GI as above   Planned Discharge Destination: Home    Time spent: 40 minutes  Author: Pennie Banter, DO 03/31/2023 4:36 PM  For on call review [email or web address].

## 2023-03-31 NOTE — Anesthesia Postprocedure Evaluation (Signed)
Anesthesia Post Note  Patient: Tanya Nguyen  Procedure(s) Performed: ESOPHAGOGASTRODUODENOSCOPY (EGD) WITH PROPOFOL  Patient location during evaluation: Specials Recovery Anesthesia Type: General Level of consciousness: awake and alert Pain management: pain level controlled Vital Signs Assessment: post-procedure vital signs reviewed and stable Respiratory status: spontaneous breathing, nonlabored ventilation, respiratory function stable and patient connected to nasal cannula oxygen Cardiovascular status: blood pressure returned to baseline and stable Postop Assessment: no apparent nausea or vomiting Anesthetic complications: no   No notable events documented.   Last Vitals:  Vitals:   03/31/23 0500 03/31/23 0842  BP:  138/72  Pulse:  92  Resp: 15 18  Temp:  36.7 C  SpO2:  100%    Last Pain:  Vitals:   03/31/23 0842  TempSrc: Oral  PainSc: 0-No pain                 Corinda Gubler

## 2023-03-31 NOTE — Plan of Care (Signed)

## 2023-03-31 NOTE — Plan of Care (Signed)
  Problem: Education: Goal: Knowledge of General Education information will improve Description: Including pain rating scale, medication(s)/side effects and non-pharmacologic comfort measures Outcome: Progressing   Problem: Clinical Measurements: Goal: Ability to maintain clinical measurements within normal limits will improve Outcome: Progressing Goal: Will remain free from infection Outcome: Progressing Goal: Respiratory complications will improve Outcome: Progressing Goal: Cardiovascular complication will be avoided Outcome: Progressing   Problem: Activity: Goal: Risk for activity intolerance will decrease Outcome: Progressing   Problem: Elimination: Goal: Will not experience complications related to urinary retention Outcome: Progressing   Problem: Pain Managment: Goal: General experience of comfort will improve and/or be controlled Outcome: Progressing

## 2023-04-01 DIAGNOSIS — E871 Hypo-osmolality and hyponatremia: Secondary | ICD-10-CM | POA: Diagnosis not present

## 2023-04-01 LAB — MAGNESIUM: Magnesium: 1.8 mg/dL (ref 1.7–2.4)

## 2023-04-01 LAB — BASIC METABOLIC PANEL
Anion gap: 11 (ref 5–15)
BUN: 12 mg/dL (ref 6–20)
CO2: 24 mmol/L (ref 22–32)
Calcium: 7.8 mg/dL — ABNORMAL LOW (ref 8.9–10.3)
Chloride: 99 mmol/L (ref 98–111)
Creatinine, Ser: 0.5 mg/dL (ref 0.44–1.00)
GFR, Estimated: >60 mL/min (ref >60)
Glucose, Bld: 93 mg/dL (ref 70–99)
Potassium: 3.8 mmol/L (ref 3.5–5.1)
Sodium: 134 mmol/L — ABNORMAL LOW (ref 135–145)

## 2023-04-01 MED ORDER — ALUM & MAG HYDROXIDE-SIMETH 200-200-20 MG/5ML PO SUSP
30.0000 mL | ORAL | Status: DC | PRN
  Administered 2023-04-01 (×2): 30 mL via ORAL
  Filled 2023-04-01 (×2): qty 30

## 2023-04-01 MED ORDER — PROCHLORPERAZINE EDISYLATE 10 MG/2ML IJ SOLN
10.0000 mg | Freq: Once | INTRAMUSCULAR | Status: AC
  Administered 2023-04-01: 10 mg via INTRAVENOUS
  Filled 2023-04-01: qty 2

## 2023-04-01 MED ORDER — ADULT MULTIVITAMIN W/MINERALS CH: 1.0000 | ORAL_TABLET | Freq: Every day | ORAL | Status: AC

## 2023-04-01 NOTE — Progress Notes (Signed)
Transport here to tx pt to new bed, 222. Receiving RN updated via secure chat as well as pt.

## 2023-04-01 NOTE — Progress Notes (Signed)
IV removed without complications. Discharge education completed, patient verbalized understanding. Patient is dressing and waiting for ride.   Tanya Nguyen

## 2023-04-01 NOTE — Progress Notes (Signed)
Pt's report received from primary nurse, and waiting for bed to be cleaned to complete transfer.

## 2023-04-01 NOTE — Plan of Care (Signed)

## 2023-04-01 NOTE — Discharge Summary (Incomplete)
Physician Discharge Summary   Patient: Tanya Nguyen MRN: [id number] DOB: 10/25/1965  Admit date:     03/29/2023  Discharge date: 04/01/2023  Discharge Physician: Pennie Banter   PCP: Doreene Nest, NP   Recommendations at discharge:  {Tip this will not be part of the note when signed- Example include specific recommendations for outpatient follow-up, pending tests to follow-up on. (Optional):26781}  ***  Discharge Diagnoses: Principal Problem:   Hyponatremia Active Problems:   Hx of bypass gastrojejunostomy   GERD (gastroesophageal reflux disease)   Hypothyroidism   Depression   Weight loss   Insomnia   Weakness   Bipolar I disorder, most recent episode depressed (HCC)   Protein-calorie malnutrition, severe   Chronic pain syndrome   Iron deficiency anemia   Anxiety   Hypomagnesemia   Coffee ground emesis   Hypokalemia   Thrombocytosis   Nausea vomiting and diarrhea  Resolved Problems:   * No resolved hospital problems. [facility] Hospital Course: Tanya Nguyen is a 58 year old famale with history of hypothyroid, depression, anxiety, GERD, who presents for chief concern of weakness for several weeks.   Vitals in the ED showed temperature of 98.2, respiration of 16, HR 124, blood pressure of 116/106, spO2 of 94% on room air.   Serum sodium of 125, potassium 3.4, chloride 88, bicarb 25, BUN of less than 5, serum creatinine of 0.87, EGFR greater than 60, nonfasting glucose 200, WBC 6.1, hemoglobin 15.3, platelets of 421.  COVID/influenza A/influenza B/RSV PCR were negative.  UA has been ordered and pending collection.  ED treatment: Sodium chloride 1 L bolus, ondansetron 4 mg IV one-time dose.  Assessment and Plan: * Hyponatremia In setting of weight loss with BMI of 16.96 Carcinoma is considered at this time Agree with EDP, CT abdomen pelvis with contrast Check magnesium, phosphorus, serum osmolality, urine osmolality, hepatic function panel on  admission Patient is status post sodium chloride 1 L bolus per EDP No further IV fluid ordered on admission Goal sodium increase over the next 24 hours is 8-10 Timed serum sodium recheck at 20:00 hours on day of admission BMP in the a.m.  Thrombocytosis And polycythemia Query volume contraction in setting of GI loss Status post sodium chloride 1 L bolus per EDP Recheck CBC in a.m.  Hypokalemia With hypomagnesemia Correct magnesium level Potassium chloride 20 mill equivalent p.o. one-time dose ordered on admission Recheck BMP in a.m.  Coffee ground emesis Patient reports her last episode of coffee-ground emesis was 2 days ago She has been having worsening nausea and vomiting over the last 2 months that has caused her to have an unintentional 11 pound weight loss Protonix 40 mg IV twice daily ordered on admission for 2 days Recheck CBC in the a.m. Gastroenterology has been consulted  Hypomagnesemia Serum magnesium is 1.5 on admission Supplement with magnesium sulfate 2 g IV one-time dose ordered on admission Rechecks magnesium level in the a.m.  Anxiety Home hydroxyzine 12.5 to 25 mg tablet nightly as needed for anxiety resumed  Chronic pain syndrome PDMP reviewed: Patient has tramadol 50 mg tablet, 60 tablets for 30 days, that was filled on 02/07/2023 Home tramadol 50 mg every 12 hours as needed for moderate pain resumed  Protein-calorie malnutrition, severe Registered dietitian has been consulted  Weakness Increasing weakness over the last 2-6 months B12 deficiency was considered, based on labs on 12/29/2022: Patient had a B12 level of 959 therefore serum B12 level was not checked on admission Magnesium level, phosphorus  level were checked Fall precaution  Insomnia Melatonin 5 mg nightly as needed for sleep ordered  Weight loss Recommend close outpatient follow-up with PCP and GI provider as appropriate Gastroenterology has been consulted as patient endorses pencil  thin stool  Depression Home duloxetine 60 mg nightly, omeprazole 2 mg nightly resumed on admission  Hypothyroidism Outpatient TSH from 03/11/2023 and 03/17/2023 were 0.07 and 0.04 respectively Home levothyroxine 75 mcg not resumed on admission Per medication reconciliation, patient took her dose of levothyroxine in the AM prior to ED presentation Patient will need dosing to be re-evaluated given weight loss  GERD (gastroesophageal reflux disease) Home Protonix 40 mg daily resumed      {Tip this will not be part of the note when signed Body mass index is 16.96 kg/m. ,  Nutrition Documentation    Flowsheet Row ED to Hosp-Admission (Current) from 03/29/2023 in [facility]  Nutrition Problem Increased nutrient needs  Etiology acute illness  Nutrition Goal Patient will meet greater than or equal to 90% of their needs  Interventions Boost Breeze, MVI, Liberalize Diet, Magic cup     ,  (Optional):26781}  {(NOTE) Pain control PDMP Statment (Optional):26782} Consultants: *** Procedures performed: ***  Disposition: {Plan; Disposition:26390} Diet recommendation:  {Diet_Plan:26776} DISCHARGE MEDICATION: Allergies as of 04/01/2023       Reactions   Penicillins Shortness Of Breath, Diarrhea, Nausea And Vomiting   Has patient had a PCN reaction causing immediate rash, facial/tongue/throat swelling, SOB or lightheadedness with hypotension: yes Has patient had a PCN reaction causing severe rash involving mucus membranes or skin necrosis: no Has patient had a PCN reaction that required hospitalization no Has patient had a PCN reaction occurring within the last 10 years: about 10 years If all of the above answers are "NO", then may proceed with Cephalosporin use.   Nsaids Other (See Comments)   Pt states she is not allergic  Other reaction(s): OTHER Pt states NSAIDS cause "internal bleeding"   Gabapentin Other (See Comments)   Hair Loss   Pregabalin     Tremors   Sulfa Antibiotics Nausea And Vomiting        Medication List     TAKE these medications    ARIPiprazole 2 MG tablet Commonly known as: ABILIFY Take 1 tablet (2 mg total) by mouth at bedtime.   doxepin 10 MG capsule Commonly known as: SINEQUAN Take 1 capsule (10 mg total) by mouth at bedtime as needed (insomnia).   DULoxetine 60 MG capsule Commonly known as: CYMBALTA TAKE 1 CAPSULE (60 MG TOTAL) BY MOUTH DAILY. FOR ANXIETY AND PAIN.   DULoxetine 30 MG capsule Commonly known as: CYMBALTA TAKE 1 CAPSULE (30 MG TOTAL) BY MOUTH DAILY. FOR ANXIETY AND PAIN. TAKE WITH 60 MG.   hydrOXYzine 25 MG tablet Commonly known as: ATARAX Take 0.5-1 tablets (12.5-25 mg total) by mouth at bedtime as needed for anxiety.   levothyroxine 75 MCG tablet Commonly known as: Synthroid Take 1 tablet by mouth every morning on an empty stomach with water only.  No food or other medications for 30 minutes.   mirtazapine 15 MG tablet Commonly known as: REMERON 7.5 mg at night for one week, then 15 mg at night   multivitamin with minerals Tabs tablet Take 1 tablet by mouth daily.   oxybutynin 5 MG 24 hr tablet Commonly known as: DITROPAN-XL TAKE 1 TABLET (5 MG TOTAL) BY MOUTH AT BEDTIME. FOR BLADDER INCONTINENCE.   pantoprazole 40 MG tablet Commonly known as:  PROTONIX Take 1 tablet (40 mg total) by mouth daily.   SUMAtriptan 50 MG tablet Commonly known as: IMITREX Take 1 tablet at migraine onset. May repeat in 2 hours if headache persists or recurs.   traMADol 50 MG tablet Commonly known as: ULTRAM Take 1 tablet (50 mg total) by mouth every 12 (twelve) hours as needed.        Discharge Exam: Filed Weights   03/29/23 1049  Weight: 38.1 kg   ***  Condition at discharge: {DC Condition:26389}  The results of significant diagnostics from this hospitalization (including imaging, microbiology, ancillary and laboratory) are listed below for reference.   Imaging  Studies: CT ABDOMEN PELVIS W CONTRAST Result Date: 03/29/2023 CLINICAL DATA:  Weakness for several weeks, vomiting, chills EXAM: CT ABDOMEN AND PELVIS WITH CONTRAST TECHNIQUE: Multidetector CT imaging of the abdomen and pelvis was performed using the standard protocol following bolus administration of intravenous contrast. RADIATION DOSE REDUCTION: This exam was performed according to the departmental dose-optimization program which includes automated exposure control, adjustment of the mA and/or kV according to patient size and/or use of iterative reconstruction technique. CONTRAST:  OMNIPAQUE IOHEXOL 300 MG/ML  SOLN COMPARISON:  09/21/2021 FINDINGS: Lower chest: No acute pleural or parenchymal lung disease. Hepatobiliary: Cholecystectomy. Simple appearing cysts within the left and right lobes of the liver are stable and do not require follow-up. The remainder of the liver is unremarkable. No biliary duct dilation. Pancreas: Unremarkable. No pancreatic ductal dilatation or surrounding inflammatory changes. Spleen: Normal in size without focal abnormality. Adrenals/Urinary Tract: There are bilateral nonobstructing less than 3 mm renal calculi, not appreciably changed since prior exam. No evidence of obstructive uropathy. Bladder is moderately distended without focal abnormality. The adrenals are normal. Stomach/Bowel: Postsurgical changes from prior gastrojejunostomy and jejunojejunostomy, with likely functional dilatation of the efferent jejunostomy loop and unchanged since prior CT and upper GI studies. No evidence of bowel obstruction or ileus. The appendix, if still present, is not well visualized. No bowel wall thickening or inflammatory change. Vascular/Lymphatic: Aortic atherosclerosis. No enlarged abdominal or pelvic lymph nodes. Reproductive: Uterus and bilateral adnexa are unremarkable. Other: No free fluid or free intraperitoneal gas. No abdominal wall hernia. Musculoskeletal: No acute or  destructive bony abnormalities. Reconstructed images demonstrate no additional findings. IMPRESSION: 1. Prior gastrojejunostomy and jejunojejunostomy, stable in appearance. 2. Multiple punctate bilateral nonobstructing renal calculi. No obstructive uropathy within either kidney. 3. Otherwise no acute intra-abdominal or intrapelvic process. 4.  Aortic Atherosclerosis (ICD10-I70.0). Electronically Signed   By: Sharlet Salina M.D.   On: 03/29/2023 15:17    Microbiology: Results for orders placed or performed during the hospital encounter of 03/29/23  Resp panel by RT-PCR (RSV, Flu A&B, Covid) Anterior Nasal Swab     Status: None   Collection Time: 03/29/23 10:51 AM   Specimen: Anterior Nasal Swab  Result Value Ref Range Status   SARS Coronavirus 2 by RT PCR NEGATIVE NEGATIVE Final    Comment: (NOTE) SARS-CoV-2 target nucleic acids are NOT DETECTED.  The SARS-CoV-2 RNA is generally detectable in upper respiratory specimens during the acute phase of infection. The lowest concentration of SARS-CoV-2 viral copies this assay can detect is 138 copies/mL. A negative result does not preclude SARS-Cov-2 infection and should not be used as the sole basis for treatment or other patient management decisions. A negative result may occur with  improper specimen collection/handling, submission of specimen other than nasopharyngeal swab, presence of viral mutation(s) within the areas targeted by this assay, and inadequate number  of viral copies(<138 copies/mL). A negative result must be combined with clinical observations, patient history, and epidemiological information. The expected result is Negative.  Fact Sheet for Patients:  BloggerCourse.com  Fact Sheet for Healthcare Providers:  SeriousBroker.it  This test is no t yet approved or cleared by the [place] FDA and  has been authorized for detection and/or diagnosis of SARS-CoV-2 by FDA under  an Emergency Use Authorization (EUA). This EUA will remain  in effect (meaning this test can be used) for the duration of the COVID-19 declaration under Section 564(b)(1) of the Act, 21 U.S.C.section 360bbb-3(b)(1), unless the authorization is terminated  or revoked sooner.       Influenza A by PCR NEGATIVE NEGATIVE Final   Influenza B by PCR NEGATIVE NEGATIVE Final    Comment: (NOTE) The Xpert Xpress SARS-CoV-2/FLU/RSV plus assay is intended as an aid in the diagnosis of influenza from Nasopharyngeal swab specimens and should not be used as a sole basis for treatment. Nasal washings and aspirates are unacceptable for Xpert Xpress SARS-CoV-2/FLU/RSV testing.  Fact Sheet for Patients: BloggerCourse.com  Fact Sheet for Healthcare Providers: SeriousBroker.it  This test is not yet approved or cleared by the [place] FDA and has been authorized for detection and/or diagnosis of SARS-CoV-2 by FDA under an Emergency Use Authorization (EUA). This EUA will remain in effect (meaning this test can be used) for the duration of the COVID-19 declaration under Section 564(b)(1) of the Act, 21 U.S.C. section 360bbb-3(b)(1), unless the authorization is terminated or revoked.     Resp Syncytial Virus by PCR NEGATIVE NEGATIVE Final    Comment: (NOTE) Fact Sheet for Patients: BloggerCourse.com  Fact Sheet for Healthcare Providers: SeriousBroker.it  This test is not yet approved or cleared by the [place] FDA and has been authorized for detection and/or diagnosis of SARS-CoV-2 by FDA under an Emergency Use Authorization (EUA). This EUA will remain in effect (meaning this test can be used) for the duration of the COVID-19 declaration under Section 564(b)(1) of the Act, 21 U.S.C. section 360bbb-3(b)(1), unless the authorization is terminated or revoked.  Performed at [facility], [address] [place], [place] [address]     Labs: CBC: Recent Labs  Lab 03/29/23 1051 03/30/23 0317  WBC 6.1 9.7  HGB 15.3* 14.4  HCT 42.3 40.1  MCV 82.9 84.4  PLT 421* 348   Basic Metabolic Panel: Recent Labs  Lab 03/29/23 1051 03/29/23 2258 03/30/23 0317 03/31/23 1028 04/01/23 0632  NA 125* 129* 131* 134* 134*  K 3.4*  --  3.7 4.4 3.8  CL 88*  --  96* 99 99  CO2 25  --  27 24 24   GLUCOSE 200*  --  98 72 93  BUN <5*  --  10 10 12   CREATININE 0.87  --  0.61 0.41* 0.50  CALCIUM 8.4*  --  8.0* 7.7* 7.8*  MG 1.5*  --  2.3  --  1.8  PHOS 3.6  --   --   --   --    Liver Function Tests: Recent Labs  Lab 03/29/23 1051  AST 47*  ALT 28  ALKPHOS 79  BILITOT 0.9  PROT 5.7*  ALBUMIN 2.9*   CBG: Recent Labs  Lab 03/29/23 1227  GLUCAP 194*    Discharge time spent: {LESS THAN/GREATER THAN:26388} 30 minutes.  Signed: Pennie Banter, DO [facility] 04/01/2023

## 2023-04-01 NOTE — TOC CM/SW Note (Signed)
Transition of Care [facility]) - Inpatient Brief Assessment   Patient Details  Name: Tanya Nguyen MRN: [id number] Date of Birth: May 16, 1965  Transition of Care [facility]) CM/SW Contact:    Chapman Fitch, RN Phone Number: 04/01/2023, 10:37 AM   Clinical Narrative:   Transition of Care [facility]) Screening Note   Patient Details  Name: Tanya Nguyen Date of Birth: 12-08-1965   Transition of Care [facility]) CM/SW Contact:    Chapman Fitch, RN Phone Number: 04/01/2023, 10:37 AM    Transition of Care Department [facility]) has reviewed patient and no TOC needs have been identified at this time. If new patient transition needs arise, please place a TOC consult.    Transition of Care Asessment: Insurance and Status: Insurance coverage has been reviewed Patient has primary care physician: Yes     Prior/Current Home Services: No current home services Social Drivers of Health Review: SDOH reviewed no interventions necessary Readmission risk has been reviewed: Yes Transition of care needs: no transition of care needs at this time

## 2023-04-04 ENCOUNTER — Telehealth: Payer: Self-pay

## 2023-04-04 DIAGNOSIS — F330 Major depressive disorder, recurrent, mild: Secondary | ICD-10-CM

## 2023-04-04 DIAGNOSIS — F4310 Post-traumatic stress disorder, unspecified: Secondary | ICD-10-CM

## 2023-04-04 MED ORDER — HYDROXYZINE HCL 25 MG PO TABS: 12.5000 mg | ORAL_TABLET | Freq: Every evening | ORAL | 0 refills | Status: AC | PRN

## 2023-04-04 NOTE — Telephone Encounter (Signed)
I have sent hydroxyzine 25 mg to pharmacy at [facility].

## 2023-04-04 NOTE — Telephone Encounter (Signed)
pt called states she is out of the hydroxyzine. ( i don't see this medication on her medication list) i do see a note that it had been refilled in a phone message. pt was pt was last seen on 1-2 next appt 2-11

## 2023-04-04 NOTE — Telephone Encounter (Signed)
per the pharmacy pt last filled on 12-27 rx was written by dr. Lolly Mustache.

## 2023-04-04 NOTE — Telephone Encounter (Signed)
called to notify pt that rx was sent. she also wanted to know if you would give her zolpidem that she is not sleeping. states that her mom is not doing well in the nursing home.

## 2023-04-04 NOTE — Telephone Encounter (Signed)
Looks like Dr.Hisada provided mirtazapine for sleep.  Please check with patient if she is currently taking 15 mg at bedtime.  If that is not helpful she could combine hydroxyzine with the mirtazapine and increase the hydroxyzine to 2 tablets ( 25 mg each ) at night to see if that will help.  The other option will be increasing the mirtazapine to 22.5 mg. Please let me know.  I will defer the decision regarding Ambien/zolpidem to Dr. Vanetta Shawl when she gets back in office.

## 2023-04-05 NOTE — Telephone Encounter (Signed)
-   Please contact for sooner available visit - Please call the patient. Considering the other physical issues, including her recent admission due to hyponatremia and ongoing weight loss, I do not feel comfortable prescribing Ambien at this time due to risk of fall. Please advise her to try hydroxyzine as recommended by Dr. Laurence Spates for now. I will also notify the front desk to schedule a sooner appointment if one becomes available.

## 2023-04-06 ENCOUNTER — Other Ambulatory Visit: Payer: Self-pay | Admitting: Primary Care

## 2023-04-06 DIAGNOSIS — G43111 Migraine with aura, intractable, with status migrainosus: Secondary | ICD-10-CM

## 2023-04-06 NOTE — Telephone Encounter (Signed)
Noted. I do not feel comfortable adjusting her medication before she is seen due to her recent admission and medical condition. We plan to address this at her next visit tomorrow.
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
## 2023-04-06 NOTE — Telephone Encounter (Signed)
pt states that she been taking 1 whole pill of the hydroxyzine at night and states that is not enough. she states it needs to be increased or something else needs to be done.

## 2023-04-06 NOTE — Telephone Encounter (Signed)
forgot to add she has an appt for tomorrow but she just wanted to let you know before her appt that the hydroxyzine was not working either.

## 2023-04-07 ENCOUNTER — Ambulatory Visit (INDEPENDENT_AMBULATORY_CARE_PROVIDER_SITE_OTHER): Payer: PPO | Admitting: Psychiatry

## 2023-04-07 ENCOUNTER — Encounter: Payer: Self-pay | Admitting: Psychiatry

## 2023-04-07 VITALS — BP 118/84 | HR 118 | Temp 98.0°F | Ht 59.0 in | Wt 79.6 lb

## 2023-04-07 DIAGNOSIS — F3341 Major depressive disorder, recurrent, in partial remission: Secondary | ICD-10-CM

## 2023-04-07 DIAGNOSIS — G4700 Insomnia, unspecified: Secondary | ICD-10-CM | POA: Diagnosis not present

## 2023-04-07 DIAGNOSIS — F4310 Post-traumatic stress disorder, unspecified: Secondary | ICD-10-CM | POA: Diagnosis not present

## 2023-04-07 NOTE — Patient Instructions (Signed)
Start mirtazapine 7.5 mg at night for one week, then 15 mg at night  Continue Abilify 2 mg at night   Continue hydroxyzine 25 mg daily as needed for anxiety  Next appointment: 3/25 at 4:30,

## 2023-04-08 ENCOUNTER — Telehealth: Payer: Self-pay | Admitting: Primary Care

## 2023-04-08 ENCOUNTER — Telehealth: Payer: Self-pay

## 2023-04-08 ENCOUNTER — Other Ambulatory Visit: Payer: Self-pay | Admitting: Psychiatry

## 2023-04-08 MED ORDER — MIRTAZAPINE 15 MG PO TABS: ORAL_TABLET | ORAL | 1 refills | Status: DC

## 2023-04-08 NOTE — Telephone Encounter (Signed)
called the pharmacy and they stated that the rx from january had been canceled because the patient told them that she wasn't taking it. states that they would need a new rx.

## 2023-04-08 NOTE — Telephone Encounter (Signed)
 Pt.notified

## 2023-04-08 NOTE — Telephone Encounter (Signed)
notified pt,. she states that she did do that but after yesterday visit you put her back on it.

## 2023-04-08 NOTE — Telephone Encounter (Signed)
Ordered mirtazapine.

## 2023-04-08 NOTE — Telephone Encounter (Signed)
pt was seen yesterday. pt left a message that states that the mirtazapine rx was not sent to the pharmacy.

## 2023-04-08 NOTE — Telephone Encounter (Signed)
Please call patient:  I spoke with her psychiatrist this morning and also reviewed her hospital stay.  She needs to be scheduled as soon as possible for hospital follow-up with me.  Also have her stop taking the levothyroxine for now.

## 2023-04-08 NOTE — Telephone Encounter (Signed)
Called patient and reviewed all information. Patient verbalized understanding.  Scheduled hosp f/u 04/13/23 Will call if any further questions.

## 2023-04-12 ENCOUNTER — Telehealth: Payer: Self-pay

## 2023-04-12 DIAGNOSIS — R112 Nausea with vomiting, unspecified: Secondary | ICD-10-CM

## 2023-04-12 DIAGNOSIS — R1310 Dysphagia, unspecified: Secondary | ICD-10-CM

## 2023-04-12 NOTE — Telephone Encounter (Signed)
PT call back asking if someone would call her back. Pt states she only weight about 80 pounds and she doesn't want to lose any more weight. Pt states everything she eats makes her sick.

## 2023-04-12 NOTE — Telephone Encounter (Signed)
 Patient left a message on voicemail at 9:47am and states she left a message on voicemail yesterday and did not receive a call back. She states she is a Dr. Jinny patient and had a EGD done on 03/30/2023 and he told her she had inflammation on her stomach. She states she is having trouble going to the bathroom and having nausea and vomiting. Has appointment with tina on 05/04/2023 but wants to know what to do till the appointment

## 2023-04-13 ENCOUNTER — Other Ambulatory Visit (HOSPITAL_COMMUNITY): Payer: Self-pay

## 2023-04-13 ENCOUNTER — Telehealth: Payer: Self-pay | Admitting: Primary Care

## 2023-04-13 ENCOUNTER — Ambulatory Visit: Payer: PPO | Admitting: Primary Care

## 2023-04-13 ENCOUNTER — Telehealth: Payer: Self-pay | Admitting: Pharmacy Technician

## 2023-04-13 ENCOUNTER — Encounter: Payer: Self-pay | Admitting: Primary Care

## 2023-04-13 VITALS — BP 118/82 | HR 115 | Temp 97.7°F | Ht 59.0 in | Wt 79.4 lb

## 2023-04-13 DIAGNOSIS — R197 Diarrhea, unspecified: Secondary | ICD-10-CM | POA: Diagnosis not present

## 2023-04-13 DIAGNOSIS — K219 Gastro-esophageal reflux disease without esophagitis: Secondary | ICD-10-CM | POA: Diagnosis not present

## 2023-04-13 DIAGNOSIS — E038 Other specified hypothyroidism: Secondary | ICD-10-CM

## 2023-04-13 DIAGNOSIS — R112 Nausea with vomiting, unspecified: Secondary | ICD-10-CM

## 2023-04-13 MED ORDER — ONDANSETRON 4 MG PO TBDP: 4.0000 mg | ORAL_TABLET | Freq: Three times a day (TID) | ORAL | 0 refills | Status: DC | PRN

## 2023-04-13 NOTE — Telephone Encounter (Signed)
 The patient called in she said that she is still vomiting and having diarrhea. She said that the Boost shake make her sick.

## 2023-04-13 NOTE — Addendum Note (Signed)
 Addended by: Lovie Rudder on: 04/13/2023 08:57 AM   Modules accepted: Orders

## 2023-04-13 NOTE — Telephone Encounter (Signed)
 Pharmacy Patient Advocate Encounter   Received notification from CoverMyMeds that prior authorization for Ondansetron  4 MG ODT is required/requested.   Insurance verification completed.   The patient is insured through [employer] .   Per test claim: PA required and submitted KEY/EOC/Request #: [id number] CANCELLED due to  Electronic Prior Authorization not supported.  **FAXED DETERMINATION FORM TO INSURANCE.**

## 2023-04-13 NOTE — Telephone Encounter (Signed)
 During check out, pt states Clark wanted her to return in 2 weeks for labs. Pt mentioned she also has a lab appt also on 3/10, made back on 1/10. Pt asked does she still need to keep the appt or cancel due to her having labs done sooner? Please advise. Call back # [phone].

## 2023-04-13 NOTE — Progress Notes (Signed)
 Subjective:    Patient ID: Tanya Nguyen, female    DOB: 1965/04/11, 58 y.o.   MRN: [id number]  HPI  Tanya Nguyen is a very pleasant 58 y.o. female with a history of small bowel obstruction, nausea, vomiting, diarrhea, coffee-ground emesis, GERD, bipolar disorder, PTSD, bypass gastrojejunostomy, iron  deficiency anemia who presents today   She presented to [facility] ED on 03/29/2023 for generalized weakness, multiple episodes of nausea/vomiting/diarrhea for several weeks, fatigue, weight loss of 11 pounds.  Workup in the ED with significant hyponatremia with level of 125, hyperthyroidism. She was admitted for further evaluation.  During her hospital stay she underwent CT abdomen/pelvis which was without acute findings.  She was treated with IV fluids.  Her levothyroxine  was discontinued.  GI consulted who recommended upper endoscopy within the hospital setting and outpatient colonoscopy.  Upper endoscopy was without blockage but with retained food in the small bowel, diffuse gastritis.  She was discharged home on 04/01/2023 with recommendations for outpatient GI follow-up and colonoscopy.  Since her hospital stay she continues to feel badly. She continues to experience nausea, vomiting (about 2 times), and diarrhea (2-3 episodes) every other day. She continues to avoid food due to her nausea, vomiting, and diarrhea.   Diet currently consists of:  Breakfast: Egg sandwich or cereal, mostly skips Lunch: Skips Dinner: Potatoes, veggies Snacks: None Desserts: None Beverages: Protein drinks, Ensure, water   She is compliant to her pantoprazole  40 mg daily. She has not taken levothyroxine  since 04/07/23. She under a lot of stress as her mother is on hospice due to bladder cancer. She has noticed a resting tremor for which she attributes to nerves. She has an appointment scheduled with GI on 05/04/23.  She denies fevers, abdominal pain outside of diarrhea, hematemesis, bloody stools.   Wt Readings from  Last 3 Encounters:  04/13/23 79 lb 6.4 oz (36 kg)  03/29/23 83 lb 15.9 oz (38.1 kg)  03/17/23 84 lb (38.1 kg)     Review of Systems  Constitutional:  Negative for fever.  Respiratory:  Negative for shortness of breath.   Cardiovascular:  Negative for chest pain.  Gastrointestinal:  Positive for diarrhea, nausea and vomiting. Negative for abdominal pain and blood in stool.  Psychiatric/Behavioral:  The patient is nervous/anxious.          Past Medical History:  Diagnosis Date   Acquired pyloric stricture    Bleeding duodenal ulcer    CAP (community acquired pneumonia) 02/12/2020   Chronic esophagogastric ulcer    Constipation 04/02/2014   Depression    Duodenal obstruction    Gastric outlet obstruction 01/19/2018   GERD (gastroesophageal reflux disease)    Hair loss 09/22/2018   Head injury 12/24/2022   History of stomach ulcers    Hypothyroidism    Incisional hernia, without obstruction or gangrene    Intractable vomiting    Kidney stones    Melena 05/08/2021   Migraine    Migraine headache    only1-2x/month since starting topamax    Neuropathy    Nodule of middle lobe of right lung 09/17/2021   NSAID long-term use 09/16/2016   Right-sided chest pain 09/17/2021   Sacroiliac joint pain 08/14/2018   Side pain 03/30/2018   Upper GI bleed 05/18/2021    Social History   Socioeconomic History   Marital status: Married    Spouse name: Not on file   Number of children: 0   Years of education: 12   Highest education level: High school graduate  Occupational  History   Occupation: Homemaker  Tobacco Use   Smoking status: Never   Smokeless tobacco: Never  Vaping Use   Vaping status: Never Used  Substance and Sexual Activity   Alcohol use: Not Currently    Alcohol/week: 0.0 standard drinks of alcohol   Drug use: No   Sexual activity: Not Currently    Birth control/protection: None  Other Topics Concern   Not on file  Social History Narrative   Lives at home  with her husband.   Right-handed.   Rare caffeine.   Social Drivers of Corporate Investment Banker Strain: Not on file  Food Insecurity: No Food Insecurity (03/30/2023)   Hunger Vital Sign    Worried About Running Out of Food in the Last Year: Never true    Ran Out of Food in the Last Year: Never true  Transportation Needs: No Transportation Needs (03/30/2023)   PRAPARE - Administrator, Civil Service (Medical): No    Lack of Transportation (Non-Medical): No  Physical Activity: Not on file  Stress: Not on file  Social Connections: Socially Integrated (03/30/2023)   Social Connection and Isolation Panel [NHANES]    Frequency of Communication with Friends and Family: Three times a week    Frequency of Social Gatherings with Friends and Family: Never    Attends Religious Services: More than 4 times per year    Active Member of Clubs or Organizations: Yes    Attends Banker Meetings: More than 4 times per year    Marital Status: Married  Catering Manager Violence: Not At Risk (03/30/2023)   Humiliation, Afraid, Rape, and Kick questionnaire    Fear of Current or Ex-Partner: No    Emotionally Abused: No    Physically Abused: No    Sexually Abused: No    Past Surgical History:  Procedure Laterality Date   ABDOMINAL HYSTERECTOMY  2004   partial   CHOLECYSTECTOMY     COLONOSCOPY WITH PROPOFOL  N/A 02/27/2018   Procedure: COLONOSCOPY WITH PROPOFOL ;  Surgeon: Jinny Carmine, MD;  Location: [facility];  Service: Endoscopy;  Laterality: N/A;   ESOPHAGOGASTRODUODENOSCOPY (EGD) WITH PROPOFOL  N/A 01/12/2018   Procedure: ESOPHAGOGASTRODUODENOSCOPY (EGD) WITH BIOPSIES;  Surgeon: Jinny Carmine, MD;  Location: [facility];  Service: Endoscopy;  Laterality: N/A;   ESOPHAGOGASTRODUODENOSCOPY (EGD) WITH PROPOFOL  N/A 01/25/2018   Procedure: ESOPHAGOGASTRODUODENOSCOPY (EGD) WITH PROPOFOL ;  Surgeon: Therisa Bi, MD;  Location: [facility] ENDOSCOPY;  Service:  Gastroenterology;  Laterality: N/A;   ESOPHAGOGASTRODUODENOSCOPY (EGD) WITH PROPOFOL  N/A 02/27/2018   Procedure: ESOPHAGOGASTRODUODENOSCOPY (EGD) WITH PROPOFOL ;  Surgeon: Jinny Carmine, MD;  Location: [facility];  Service: Endoscopy;  Laterality: N/A;   ESOPHAGOGASTRODUODENOSCOPY (EGD) WITH PROPOFOL  N/A 05/09/2021   Procedure: ESOPHAGOGASTRODUODENOSCOPY (EGD) WITH PROPOFOL ;  Surgeon: Onita Elspeth Sharper, DO;  Location: [facility] ENDOSCOPY;  Service: Gastroenterology;  Laterality: N/A;   ESOPHAGOGASTRODUODENOSCOPY (EGD) WITH PROPOFOL  N/A 03/09/2022   Procedure: ESOPHAGOGASTRODUODENOSCOPY (EGD) WITH PROPOFOL ;  Surgeon: Jinny Carmine, MD;  Location: [facility] ENDOSCOPY;  Service: Endoscopy;  Laterality: N/A;   ESOPHAGOGASTRODUODENOSCOPY (EGD) WITH PROPOFOL  N/A 03/30/2023   Procedure: ESOPHAGOGASTRODUODENOSCOPY (EGD) WITH PROPOFOL ;  Surgeon: Jinny Carmine, MD;  Location: [facility] ENDOSCOPY;  Service: Endoscopy;  Laterality: N/A;   INCISIONAL HERNIA REPAIR N/A 11/23/2018   Procedure: HERNIA REPAIR INCISIONAL;  Surgeon: Desiderio Schanz, MD;  Location: [facility] ORS;  Service: General;  Laterality: N/A;   INSERTION OF MESH N/A 11/23/2018   Procedure: INSERTION OF MESH;  Surgeon: Desiderio Schanz, MD;  Location: [facility] ORS;  Service:  General;  Laterality: N/A;   LAPAROTOMY N/A 01/20/2018   Procedure: EXPLORATORY LAPAROTOMY;  Surgeon: Desiderio Schanz, MD;  Location: [facility] ORS;  Service: General;  Laterality: N/A;   TOOTH EXTRACTION      Family History  Problem Relation Age of Onset   Cancer Mother    Breast cancer Mother 56   Lung cancer Father    Drug abuse Sister    Alcohol abuse Sister    Bipolar disorder Sister    Alcohol abuse Maternal Grandmother    Stroke Maternal Grandmother    Stroke Paternal Grandmother     Allergies  Allergen Reactions   Penicillins Shortness Of Breath, Diarrhea and Nausea And Vomiting    Has patient had a PCN reaction causing immediate rash, facial/tongue/throat swelling, SOB or  lightheadedness with hypotension: yes Has patient had a PCN reaction causing severe rash involving mucus membranes or skin necrosis: no Has patient had a PCN reaction that required hospitalization no Has patient had a PCN reaction occurring within the last 10 years: about 10 years If all of the above answers are NO, then may proceed with Cephalosporin use.    Nsaids Other (See Comments)    Pt states she is not allergic  Other reaction(s): OTHER Pt states NSAIDS cause internal bleeding   Gabapentin  Other (See Comments)    Hair Loss    Pregabalin      Tremors   Sulfa Antibiotics Nausea And Vomiting    Current Outpatient Medications on File Prior to Visit  Medication Sig Dispense Refill   ARIPiprazole  (ABILIFY ) 2 MG tablet Take 1 tablet (2 mg total) by mouth at bedtime. 90 tablet 0   hydrOXYzine  (ATARAX ) 25 MG tablet Take 0.5-1 tablets (12.5-25 mg total) by mouth at bedtime as needed. 30 tablet 0   levothyroxine  (SYNTHROID ) 75 MCG tablet Take 1 tablet by mouth every morning on an empty stomach with water  only.  No food or other medications for 30 minutes. 90 tablet 0   mirtazapine  (REMERON ) 15 MG tablet 7.5 mg at night for one week, then 15 mg at night 30 tablet 1   Multiple Vitamin (MULTIVITAMIN WITH MINERALS) TABS tablet Take 1 tablet by mouth daily.     oxybutynin  (DITROPAN -XL) 5 MG 24 hr tablet TAKE 1 TABLET (5 MG TOTAL) BY MOUTH AT BEDTIME. FOR BLADDER INCONTINENCE. 90 tablet 2   pantoprazole  (PROTONIX ) 40 MG tablet Take 1 tablet (40 mg total) by mouth daily. 90 tablet 0   SUMAtriptan  (IMITREX ) 50 MG tablet Take 1 tablet at migraine onset. May repeat in 2 hours if headache persists or recurs. 9 tablet 0   traMADol  (ULTRAM ) 50 MG tablet Take 1 tablet (50 mg total) by mouth every 12 (twelve) hours as needed. 60 tablet 2   doxepin  (SINEQUAN ) 10 MG capsule Take 1 capsule (10 mg total) by mouth at bedtime as needed (insomnia). (Patient not taking: Reported on 04/07/2023) 30 capsule 1    No current facility-administered medications on file prior to visit.    BP 118/82   Pulse (!) 115   Temp 97.7 F (36.5 C) (Temporal)   Ht 4' 11 (1.499 m)   Wt 79 lb 6.4 oz (36 kg)   SpO2 98%   BMI 16.04 kg/m  Objective:   Physical Exam Cardiovascular:     Rate and Rhythm: Normal rate and regular rhythm.  Pulmonary:     Effort: Pulmonary effort is normal.     Breath sounds: Normal breath sounds.  Abdominal:  General: Bowel sounds are normal.     Palpations: Abdomen is soft.     Tenderness: There is no abdominal tenderness.  Musculoskeletal:     Cervical back: Neck supple.  Skin:    General: Skin is warm and dry.  Neurological:     Mental Status: She is alert and oriented to person, place, and time.  Psychiatric:        Mood and Affect: Mood normal.           Assessment & Plan:  Nausea vomiting and diarrhea Assessment & Plan: With recent hospitalization. Hospital notes, labs, imaging reviewed.  Unclear etiology, GI thinks this may be anatomical from her prior surgery. Continue pantoprazole  40 mg daily.  Prescription provided for ondansetron  ODT to use every 8 hours as needed.  Discussed to use it sparingly. Strongly encouraged to advance diet as tolerated with bland foods, boost, Ensure, etc.  Remain off levothyroxine .  Repeat TSH in 2 weeks.  Follow up with GI.  Orders: -     Ondansetron ; Take 1 tablet (4 mg total) by mouth every 8 (eight) hours as needed for nausea or vomiting.  Dispense: 20 tablet; Refill: 0  Gastroesophageal reflux disease, unspecified whether esophagitis present Assessment & Plan: Continue pantoprazole  40 mg daily. Reviewed upper endoscopy results from January 2025.   Other specified hypothyroidism Assessment & Plan: Remain off levothyroxine . Repeat TSH in 2 weeks.   Orders: -     TSH; Future -     T4, free; Future        Timoteo Carreiro K Prisila Dlouhy, NP

## 2023-04-13 NOTE — Assessment & Plan Note (Signed)
 With recent hospitalization. Hospital notes, labs, imaging reviewed.  Unclear etiology, GI thinks this may be anatomical from her prior surgery. Continue pantoprazole  40 mg daily.  Prescription provided for ondansetron  ODT to use every 8 hours as needed.  Discussed to use it sparingly. Strongly encouraged to advance diet as tolerated with bland foods, boost, Ensure, etc.  Remain off levothyroxine .  Repeat TSH in 2 weeks.  Follow up with GI.

## 2023-04-13 NOTE — Patient Instructions (Signed)
 You may take the ondansetron  (Zofran ) antinausea medication every 8 hours as needed for nausea and vomiting.  Please use sparingly.  Schedule a lab appointment for 2 weeks to recheck your thyroid  function.  Continue taking the pantoprazole  heartburn pill every day.  Try to drink Ensure or boost shakes to help with weight gain.  Advance your diet as tolerated.  It was a pleasure to see you today!

## 2023-04-13 NOTE — Assessment & Plan Note (Signed)
 Remain off levothyroxine . Repeat TSH in 2 weeks.

## 2023-04-13 NOTE — Telephone Encounter (Signed)
 Patient advised.

## 2023-04-13 NOTE — Telephone Encounter (Signed)
 Keep lab appointment on 03/10

## 2023-04-13 NOTE — Telephone Encounter (Signed)
 I spoke to pt and she is aware of Dr Curtis Dow recommendations... Number to surgery given for her to call to schedule a f/u appt with them

## 2023-04-13 NOTE — Assessment & Plan Note (Signed)
 Continue pantoprazole  40 mg daily. Reviewed upper endoscopy results from January 2025.

## 2023-04-18 ENCOUNTER — Encounter: Payer: Self-pay | Admitting: Surgery

## 2023-04-18 ENCOUNTER — Ambulatory Visit (INDEPENDENT_AMBULATORY_CARE_PROVIDER_SITE_OTHER): Payer: PPO | Admitting: Surgery

## 2023-04-18 VITALS — BP 131/83 | HR 106 | Temp 98.2°F | Ht 59.0 in | Wt 77.4 lb

## 2023-04-18 DIAGNOSIS — Z434 Encounter for attention to other artificial openings of digestive tract: Secondary | ICD-10-CM

## 2023-04-18 DIAGNOSIS — R109 Unspecified abdominal pain: Secondary | ICD-10-CM | POA: Diagnosis not present

## 2023-04-18 DIAGNOSIS — R112 Nausea with vomiting, unspecified: Secondary | ICD-10-CM | POA: Diagnosis not present

## 2023-04-18 DIAGNOSIS — R197 Diarrhea, unspecified: Secondary | ICD-10-CM

## 2023-04-18 DIAGNOSIS — Z980 Intestinal bypass and anastomosis status: Secondary | ICD-10-CM

## 2023-04-18 NOTE — Telephone Encounter (Signed)
 Please call patient:  Did she ever pick up the Zofran  from the pharmacy for nausea? Looks like her insurance didn't cover the cost, but she may have picked it up paying cash price.

## 2023-04-18 NOTE — H&P (View-Only) (Signed)
 04/18/2023  Reason for Visit:  Dilated small bowel s/p gastrojejunostomy  Requesting Provider:  Midge Minium, MD  History of Present Illness: Tanya Nguyen is a 58 y.o. female presenting for evaluation of malfunctioning gastrojejunostomy.  The patient has a history of gastric outlet obstruction requiring exploratory laparotomy with creation of gastrojejunostomy with omega loop on 01/20/2018.  She had a subsequent incisional hernia which was repaired via open approach on 11/23/2018.  She reports having issues with taking her pills by mouth, and also with eating solids.  She will have episodes of vomiting, as well as episodes of diarrhea.  Liquids go down more smoothly and she takes some Ensure supplement.  However, she has been losing weight and feels weaker due to this.  She was admitted to the hospital on 03/29/23 due to her weakness.  She had an EGD which showed gastritis and a large amount of food in the jejunum past the gastrojejunostomy.  She had an UGI with SBFT study on 03/31/23 which showed dilated loops of small bowel around the area of the gastrojejunostomy.  Although contrast fills the colon after 3.5 hrs, there is also retained contrast in the stomach at 3.5 hrs.    Past Medical History: Past Medical History:  Diagnosis Date   Acquired pyloric stricture    Bleeding duodenal ulcer    CAP (community acquired pneumonia) 02/12/2020   Chronic esophagogastric ulcer    Constipation 04/02/2014   Depression    Duodenal obstruction    Gastric outlet obstruction 01/19/2018   GERD (gastroesophageal reflux disease)    Hair loss 09/22/2018   Head injury 12/24/2022   History of stomach ulcers    Hypothyroidism    Incisional hernia, without obstruction or gangrene    Intractable vomiting    Kidney stones    Melena 05/08/2021   Migraine    Migraine headache    only1-2x/month since starting topamax   Neuropathy    Nodule of middle lobe of right lung 09/17/2021   NSAID long-term use  09/16/2016   Right-sided chest pain 09/17/2021   Sacroiliac joint pain 08/14/2018   Side pain 03/30/2018   Upper GI bleed 05/18/2021     Past Surgical History: Past Surgical History:  Procedure Laterality Date   ABDOMINAL HYSTERECTOMY  2004   partial   CHOLECYSTECTOMY     COLONOSCOPY WITH PROPOFOL N/A 02/27/2018   Procedure: COLONOSCOPY WITH PROPOFOL;  Surgeon: Midge Minium, MD;  Location: [facility];  Service: Endoscopy;  Laterality: N/A;   ESOPHAGOGASTRODUODENOSCOPY (EGD) WITH PROPOFOL N/A 01/12/2018   Procedure: ESOPHAGOGASTRODUODENOSCOPY (EGD) WITH BIOPSIES;  Surgeon: Midge Minium, MD;  Location: [facility];  Service: Endoscopy;  Laterality: N/A;   ESOPHAGOGASTRODUODENOSCOPY (EGD) WITH PROPOFOL N/A 01/25/2018   Procedure: ESOPHAGOGASTRODUODENOSCOPY (EGD) WITH PROPOFOL;  Surgeon: Wyline Mood, MD;  Location: [facility] ENDOSCOPY;  Service: Gastroenterology;  Laterality: N/A;   ESOPHAGOGASTRODUODENOSCOPY (EGD) WITH PROPOFOL N/A 02/27/2018   Procedure: ESOPHAGOGASTRODUODENOSCOPY (EGD) WITH PROPOFOL;  Surgeon: Midge Minium, MD;  Location: [facility];  Service: Endoscopy;  Laterality: N/A;   ESOPHAGOGASTRODUODENOSCOPY (EGD) WITH PROPOFOL N/A 05/09/2021   Procedure: ESOPHAGOGASTRODUODENOSCOPY (EGD) WITH PROPOFOL;  Surgeon: Jaynie Collins, DO;  Location: [facility] ENDOSCOPY;  Service: Gastroenterology;  Laterality: N/A;   ESOPHAGOGASTRODUODENOSCOPY (EGD) WITH PROPOFOL N/A 03/09/2022   Procedure: ESOPHAGOGASTRODUODENOSCOPY (EGD) WITH PROPOFOL;  Surgeon: Midge Minium, MD;  Location: [facility] ENDOSCOPY;  Service: Endoscopy;  Laterality: N/A;   ESOPHAGOGASTRODUODENOSCOPY (EGD) WITH PROPOFOL N/A 03/30/2023   Procedure: ESOPHAGOGASTRODUODENOSCOPY (EGD) WITH PROPOFOL;  Surgeon: Midge Minium, MD;  Location: [facility] ENDOSCOPY;  Service: Endoscopy;  Laterality: N/A;   INCISIONAL HERNIA REPAIR N/A 11/23/2018   Procedure: HERNIA REPAIR INCISIONAL;  Surgeon: Henrene Dodge, MD;  Location: [facility]  ORS;  Service: General;  Laterality: N/A;   INSERTION OF MESH N/A 11/23/2018   Procedure: INSERTION OF MESH;  Surgeon: Henrene Dodge, MD;  Location: [facility] ORS;  Service: General;  Laterality: N/A;   LAPAROTOMY N/A 01/20/2018   Procedure: EXPLORATORY LAPAROTOMY;  Surgeon: Henrene Dodge, MD;  Location: [facility] ORS;  Service: General;  Laterality: N/A;   TOOTH EXTRACTION      Home Medications: Prior to Admission medications   Medication Sig Start Date End Date Taking? Authorizing Provider  ARIPiprazole (ABILIFY) 2 MG tablet Take 1 tablet (2 mg total) by mouth at bedtime. 01/31/23 05/01/23 Yes Neysa Hotter, MD  hydrOXYzine (ATARAX) 25 MG tablet Take 0.5-1 tablets (12.5-25 mg total) by mouth at bedtime as needed. 04/04/23 05/04/23 Yes Jomarie Longs, MD  levothyroxine (SYNTHROID) 75 MCG tablet Take 1 tablet by mouth every morning on an empty stomach with water only.  No food or other medications for 30 minutes. 03/18/23  Yes Doreene Nest, NP  mirtazapine (REMERON) 15 MG tablet 7.5 mg at night for one week, then 15 mg at night 04/08/23  Yes Hisada, Barbee Cough, MD  Multiple Vitamin (MULTIVITAMIN WITH MINERALS) TABS tablet Take 1 tablet by mouth daily. 04/01/23  Yes Esaw Grandchild A, DO  ondansetron (ZOFRAN-ODT) 4 MG disintegrating tablet Take 1 tablet (4 mg total) by mouth every 8 (eight) hours as needed for nausea or vomiting. 04/13/23  Yes Doreene Nest, NP  oxybutynin (DITROPAN-XL) 5 MG 24 hr tablet TAKE 1 TABLET (5 MG TOTAL) BY MOUTH AT BEDTIME. FOR BLADDER INCONTINENCE. 01/21/23  Yes Doreene Nest, NP  pantoprazole (PROTONIX) 40 MG tablet Take 1 tablet (40 mg total) by mouth daily. 03/29/23  Yes Midge Minium, MD  SUMAtriptan (IMITREX) 50 MG tablet Take 1 tablet at migraine onset. May repeat in 2 hours if headache persists or recurs. Patient taking differently: Take 50 mg by mouth every 2 (two) hours as needed. Take 1 tablet at migraine onset. May repeat in 2 hours if headache persists or recurs.  03/08/23  Yes Doreene Nest, NP  traMADol (ULTRAM) 50 MG tablet Take 1 tablet (50 mg total) by mouth every 12 (twelve) hours as needed. 02/01/23  Yes Edward Jolly, MD    Allergies: Allergies  Allergen Reactions   Penicillins Shortness Of Breath, Diarrhea and Nausea And Vomiting    Has patient had a PCN reaction causing immediate rash, facial/tongue/throat swelling, SOB or lightheadedness with hypotension: yes Has patient had a PCN reaction causing severe rash involving mucus membranes or skin necrosis: no Has patient had a PCN reaction that required hospitalization no Has patient had a PCN reaction occurring within the last 10 years: about 10 years If all of the above answers are "NO", then may proceed with Cephalosporin use.    Nsaids Other (See Comments)    Pt states she is not allergic  Other reaction(s): OTHER Pt states NSAIDS cause "internal bleeding"   Gabapentin Other (See Comments)    Hair Loss    Pregabalin     Tremors   Sulfa Antibiotics Nausea And Vomiting    Social History:  reports that she has never smoked. She has never been exposed to tobacco smoke. She has never used smokeless tobacco. She reports that she does not currently use alcohol. She reports that she does not use  drugs.   Family History: Family History  Problem Relation Age of Onset   Cancer Mother    Breast cancer Mother 71   Lung cancer Father    Drug abuse Sister    Alcohol abuse Sister    Bipolar disorder Sister    Alcohol abuse Maternal Grandmother    Stroke Maternal Grandmother    Stroke Paternal Grandmother     Review of Systems: Review of Systems  Constitutional:  Positive for malaise/fatigue and weight loss. Negative for chills and fever.  HENT:  Negative for hearing loss.   Respiratory:  Negative for shortness of breath.   Cardiovascular:  Negative for chest pain.  Gastrointestinal:  Positive for abdominal pain, diarrhea, nausea and vomiting.  Genitourinary:  Negative for  dysuria.  Musculoskeletal:  Negative for myalgias.  Skin:  Negative for rash.  Neurological:  Negative for dizziness.  Psychiatric/Behavioral:  Negative for depression.     Physical Exam BP 131/83   Pulse (!) 106   Temp 98.2 F (36.8 C)   Ht 4\' 11"  (1.499 m)   Wt 77 lb 6.4 oz (35.1 kg)   SpO2 99%   BMI 15.63 kg/m  CONSTITUTIONAL: No acute distress, but is very thin with malnutrition evident. HEENT:  Normocephalic, atraumatic, extraocular motion intact. NECK: Trachea is midline, and there is no jugular venous distension.  RESPIRATORY:  Lungs are clear, and breath sounds are equal bilaterally. Normal respiratory effort without pathologic use of accessory muscles. CARDIOVASCULAR: Heart is regular without murmurs, gallops, or rubs. GI: The abdomen is soft, non-distended, non-tender to palpation.  Upper midline incision is well healed without evidence of hernia recurrence. There were no palpable masses. There was no hepatosplenomegaly. MUSCULOSKELETAL:  Normal muscle strength and tone in all four extremities.  No peripheral edema or cyanosis. SKIN: Skin turgor is normal. There are no pathologic skin lesions.  NEUROLOGIC:  Motor and sensation is grossly normal.  Cranial nerves are grossly intact. PSYCH:  Alert and oriented to person, place and time. Affect is normal.  Laboratory Analysis: Labs from 03/29/2023: Sodium 125, potassium 3.4, chloride 88, CO2 25, BUN less than 5, creatinine 0.87.  Total bilirubin 0.9, AST 47, ALT 28, alkaline phosphatase 79, albumin 2.9.  WBC 6.1, hemoglobin 15.3, hematocrit 42.3, platelets 421.  Imaging: CT abdomen/pelvis on 03/29/2023: IMPRESSION: 1. Prior gastrojejunostomy and jejunojejunostomy, stable in appearance. 2. Multiple punctate bilateral nonobstructing renal calculi. No obstructive uropathy within either kidney. 3. Otherwise no acute intra-abdominal or intrapelvic process. 4.  Aortic Atherosclerosis (ICD10-I70.0).  UGI with SBFT  03/31/2023: IMPRESSION: 1. Persistent dilated loops of small bowel associated with the gastrojejunostomy. Findings are similar to the recent upper GI examination and recent CT. 2. Contrast fills the colon after 3 hours and 30 minutes. 3. No gross abnormality to the esophagus.   Assessment and Plan: This is a 58 y.o. female with progressive issues with her gastrojejunostomy.  --Discussed with the patient the findings on her last imaging studies.  I can see some progressing dilation of the small bowel loops associated with the gastrojejunostomy and the omega loop.  She has also been having progressing issues with inability to tolerate solid foods and now is mostly having to do mushy or liquids. Her issues with taking pills is unlikely to be contributed by her intestinal issues.  It is unclear what exactly is happening with her gastrojejunostomy and why it's dilated.  Discussed that perhaps there may be adhesions which are impeding some of the food progress and resulting in a  partial obstruction of sorts.  Discussed with her that we would have to take her to the OR for possible lysis of adhesions but may also need to revise and reconstruct her gastrojejunostomy.  She may also need a feeding jejunostomy tube.  There may be a component of gastroparesis given the retained contrast in her stomach, but that would not explain the dilation of her small bowel loops. --Discussed the surgery plans at length with her.  Reviewed the surgery, hospital stay, possible need for TPN while in hospital vs tube feeds, pain control, post-op recovery, and she's willing to proceed. --Will schedule her for surgery on 05/12/23.  In the meantime, have strongly recommended that she start taking more Ensure protein supplements to help boost her caloric intake and protein intake.  This will only be helpful for the healing process. --All of her questions have been answered.  I spent 40 minutes dedicated to the care of this patient on  the date of this encounter to include pre-visit review of records, face-to-face time with the patient discussing diagnosis and management, and any post-visit coordination of care.   Howie Ill, MD [facility]

## 2023-04-18 NOTE — Telephone Encounter (Signed)
 Pharmacy Patient Advocate Encounter  Received notification from [facility] [employer] that Prior Authorization for Ondansetron  4 mg ODT has been DENIED.  Full denial letter will be uploaded to the media tab. See denial reason below.   PA #/Case ID/Reference #: [id number]

## 2023-04-18 NOTE — Patient Instructions (Addendum)
 We have discussed removing a portion of your damaged small intestine today. We will schedule this surgery at [facility] with Dr. 05/12/23. Please plan a hospital stay of 5-7 days for surgery and recovery time.  You have also been given a (Blue) Pre-Care Sheet with more information regarding your particular surgery. Our surgery scheduler will call you to verify surgery date and to go over information.  Please review all information given.  You will need to arrange to be out of work for approximately 2 weeks and then you may return with a lifting restriction for 4 more weeks. If you have FMLA or Disability paperwork that needs to be filled out, please have your company fax your paperwork to [phone] or you may drop this by either office. This paperwork will be filled out within 3 days after your surgery has been completed.  Please call our office with any questions or concerns prior to your scheduled surgery.  Gastrojejunostomy is a surgical procedure that creates a new connection (anastomosis) between the stomach (gastro) and the middle section of the small intestine (jejunum).  Purpose:  Gastrojejunostomy is typically performed to: Bypass an obstruction in the pylorus (the valve between the stomach and small intestine)  Treat gastric cancer by removing part of the stomach and creating a new connection  Assist with weight loss surgery  Procedure:  The procedure can be performed open or laparoscopically (using small incisions and a camera).  Open surgery: An incision is made in the abdomen, and the pylorus or diseased portion of the stomach is removed. The remaining stomach is then joined to the jejunum using sutures or staples.  Laparoscopic surgery: Similar to open surgery, but performed through small incisions and guided by a camera.  Recovery:  Most patients stay in the hospital for 3-5 days after surgery. A liquid diet is gradually introduced after surgery.  Complications can include  infection, bleeding, and anastomotic stenosis (narrowing of the connection).  Benefits:  Relieves symptoms of obstruction, such as nausea, vomiting, and abdominal pain Improves food intake and weight loss May cure gastric cancer in some cases  Limitations:  Can lead to complications May require additional surgery to repair anastomotic stenosis Not suitable for all patients with gastric obstruction or cancer

## 2023-04-18 NOTE — Progress Notes (Signed)
 04/18/2023  Reason for Visit:  Dilated small bowel s/p gastrojejunostomy  Requesting Provider:  Midge Minium, MD  History of Present Illness: Tanya Nguyen is a 58 y.o. female presenting for evaluation of malfunctioning gastrojejunostomy.  The patient has a history of gastric outlet obstruction requiring exploratory laparotomy with creation of gastrojejunostomy with omega loop on 01/20/2018.  She had a subsequent incisional hernia which was repaired via open approach on 11/23/2018.  She reports having issues with taking her pills by mouth, and also with eating solids.  She will have episodes of vomiting, as well as episodes of diarrhea.  Liquids go down more smoothly and she takes some Ensure supplement.  However, she has been losing weight and feels weaker due to this.  She was admitted to the hospital on 03/29/23 due to her weakness.  She had an EGD which showed gastritis and a large amount of food in the jejunum past the gastrojejunostomy.  She had an UGI with SBFT study on 03/31/23 which showed dilated loops of small bowel around the area of the gastrojejunostomy.  Although contrast fills the colon after 3.5 hrs, there is also retained contrast in the stomach at 3.5 hrs.    Past Medical History: Past Medical History:  Diagnosis Date   Acquired pyloric stricture    Bleeding duodenal ulcer    CAP (community acquired pneumonia) 02/12/2020   Chronic esophagogastric ulcer    Constipation 04/02/2014   Depression    Duodenal obstruction    Gastric outlet obstruction 01/19/2018   GERD (gastroesophageal reflux disease)    Hair loss 09/22/2018   Head injury 12/24/2022   History of stomach ulcers    Hypothyroidism    Incisional hernia, without obstruction or gangrene    Intractable vomiting    Kidney stones    Melena 05/08/2021   Migraine    Migraine headache    only1-2x/month since starting topamax   Neuropathy    Nodule of middle lobe of right lung 09/17/2021   NSAID long-term use  09/16/2016   Right-sided chest pain 09/17/2021   Sacroiliac joint pain 08/14/2018   Side pain 03/30/2018   Upper GI bleed 05/18/2021     Past Surgical History: Past Surgical History:  Procedure Laterality Date   ABDOMINAL HYSTERECTOMY  2004   partial   CHOLECYSTECTOMY     COLONOSCOPY WITH PROPOFOL N/A 02/27/2018   Procedure: COLONOSCOPY WITH PROPOFOL;  Surgeon: Midge Minium, MD;  Location: [facility]GERY [facility];  Service: Endoscopy;  Laterality: N/A;   ESOPHAGOGASTRODUODENOSCOPY (EGD) WITH PROPOFOL N/A 01/12/2018   Procedure: ESOPHAGOGASTRODUODENOSCOPY (EGD) WITH BIOPSIES;  Surgeon: Midge Minium, MD;  Location: [facility];  Service: Endoscopy;  Laterality: N/A;   ESOPHAGOGASTRODUODENOSCOPY (EGD) WITH PROPOFOL N/A 01/25/2018   Procedure: ESOPHAGOGASTRODUODENOSCOPY (EGD) WITH PROPOFOL;  Surgeon: Wyline Mood, MD;  Location: [facility] ENDOSCOPY;  Service: Gastroenterology;  Laterality: N/A;   ESOPHAGOGASTRODUODENOSCOPY (EGD) WITH PROPOFOL N/A 02/27/2018   Procedure: ESOPHAGOGASTRODUODENOSCOPY (EGD) WITH PROPOFOL;  Surgeon: Midge Minium, MD;  Location: [facility] SURGERY [facility];  Service: Endoscopy;  Laterality: N/A;   ESOPHAGOGASTRODUODENOSCOPY (EGD) WITH PROPOFOL N/A 05/09/2021   Procedure: ESOPHAGOGASTRODUODENOSCOPY (EGD) WITH PROPOFOL;  Surgeon: Jaynie Collins, DO;  Location: [facility] ENDOSCOPY;  Service: Gastroenterology;  Laterality: N/A;   ESOPHAGOGASTRODUODENOSCOPY (EGD) WITH PROPOFOL N/A 03/09/2022   Procedure: ESOPHAGOGASTRODUODENOSCOPY (EGD) WITH PROPOFOL;  Surgeon: Midge Minium, MD;  Location: [facility] ENDOSCOPY;  Service: Endoscopy;  Laterality: N/A;   ESOPHAGOGASTRODUODENOSCOPY (EGD) WITH PROPOFOL N/A 03/30/2023   Procedure: ESOPHAGOGASTRODUODENOSCOPY (EGD) WITH PROPOFOL;  Surgeon: Midge Minium, MD;  Location: [facility] ENDOSCOPY;  Service: Endoscopy;  Laterality: N/A;   INCISIONAL HERNIA REPAIR N/A 11/23/2018   Procedure: HERNIA REPAIR INCISIONAL;  Surgeon: Henrene Dodge, MD;  Location: [facility]  ORS;  Service: General;  Laterality: N/A;   INSERTION OF MESH N/A 11/23/2018   Procedure: INSERTION OF MESH;  Surgeon: Henrene Dodge, MD;  Location: [facility] ORS;  Service: General;  Laterality: N/A;   LAPAROTOMY N/A 01/20/2018   Procedure: EXPLORATORY LAPAROTOMY;  Surgeon: Henrene Dodge, MD;  Location: [facility] ORS;  Service: General;  Laterality: N/A;   TOOTH EXTRACTION      Home Medications: Prior to Admission medications   Medication Sig Start Date End Date Taking? Authorizing Provider  ARIPiprazole (ABILIFY) 2 MG tablet Take 1 tablet (2 mg total) by mouth at bedtime. 01/31/23 05/01/23 Yes Neysa Hotter, MD  hydrOXYzine (ATARAX) 25 MG tablet Take 0.5-1 tablets (12.5-25 mg total) by mouth at bedtime as needed. 04/04/23 05/04/23 Yes Jomarie Longs, MD  levothyroxine (SYNTHROID) 75 MCG tablet Take 1 tablet by mouth every morning on an empty stomach with water only.  No food or other medications for 30 minutes. 03/18/23  Yes Doreene Nest, NP  mirtazapine (REMERON) 15 MG tablet 7.5 mg at night for one week, then 15 mg at night 04/08/23  Yes Hisada, Barbee Cough, MD  Multiple Vitamin (MULTIVITAMIN WITH MINERALS) TABS tablet Take 1 tablet by mouth daily. 04/01/23  Yes Esaw Grandchild A, DO  ondansetron (ZOFRAN-ODT) 4 MG disintegrating tablet Take 1 tablet (4 mg total) by mouth every 8 (eight) hours as needed for nausea or vomiting. 04/13/23  Yes Doreene Nest, NP  oxybutynin (DITROPAN-XL) 5 MG 24 hr tablet TAKE 1 TABLET (5 MG TOTAL) BY MOUTH AT BEDTIME. FOR BLADDER INCONTINENCE. 01/21/23  Yes Doreene Nest, NP  pantoprazole (PROTONIX) 40 MG tablet Take 1 tablet (40 mg total) by mouth daily. 03/29/23  Yes Midge Minium, MD  SUMAtriptan (IMITREX) 50 MG tablet Take 1 tablet at migraine onset. May repeat in 2 hours if headache persists or recurs. Patient taking differently: Take 50 mg by mouth every 2 (two) hours as needed. Take 1 tablet at migraine onset. May repeat in 2 hours if headache persists or recurs.  03/08/23  Yes Doreene Nest, NP  traMADol (ULTRAM) 50 MG tablet Take 1 tablet (50 mg total) by mouth every 12 (twelve) hours as needed. 02/01/23  Yes Edward Jolly, MD    Allergies: Allergies  Allergen Reactions   Penicillins Shortness Of Breath, Diarrhea and Nausea And Vomiting    Has patient had a PCN reaction causing immediate rash, facial/tongue/throat swelling, SOB or lightheadedness with hypotension: yes Has patient had a PCN reaction causing severe rash involving mucus membranes or skin necrosis: no Has patient had a PCN reaction that required hospitalization no Has patient had a PCN reaction occurring within the last 10 years: about 10 years If all of the above answers are "NO", then may proceed with Cephalosporin use.    Nsaids Other (See Comments)    Pt states she is not allergic  Other reaction(s): OTHER Pt states NSAIDS cause "internal bleeding"   Gabapentin Other (See Comments)    Hair Loss    Pregabalin     Tremors   Sulfa Antibiotics Nausea And Vomiting    Social History:  reports that she has never smoked. She has never been exposed to tobacco smoke. She has never used smokeless tobacco. She reports that she does not currently use alcohol. She reports that she does not use  drugs.   Family History: Family History  Problem Relation Age of Onset   Cancer Mother    Breast cancer Mother 71   Lung cancer Father    Drug abuse Sister    Alcohol abuse Sister    Bipolar disorder Sister    Alcohol abuse Maternal Grandmother    Stroke Maternal Grandmother    Stroke Paternal Grandmother     Review of Systems: Review of Systems  Constitutional:  Positive for malaise/fatigue and weight loss. Negative for chills and fever.  HENT:  Negative for hearing loss.   Respiratory:  Negative for shortness of breath.   Cardiovascular:  Negative for chest pain.  Gastrointestinal:  Positive for abdominal pain, diarrhea, nausea and vomiting.  Genitourinary:  Negative for  dysuria.  Musculoskeletal:  Negative for myalgias.  Skin:  Negative for rash.  Neurological:  Negative for dizziness.  Psychiatric/Behavioral:  Negative for depression.     Physical Exam BP 131/83   Pulse (!) 106   Temp 98.2 F (36.8 C)   Ht 4\' 11"  (1.499 m)   Wt 77 lb 6.4 oz (35.1 kg)   SpO2 99%   BMI 15.63 kg/m  CONSTITUTIONAL: No acute distress, but is very thin with malnutrition evident. HEENT:  Normocephalic, atraumatic, extraocular motion intact. NECK: Trachea is midline, and there is no jugular venous distension.  RESPIRATORY:  Lungs are clear, and breath sounds are equal bilaterally. Normal respiratory effort without pathologic use of accessory muscles. CARDIOVASCULAR: Heart is regular without murmurs, gallops, or rubs. GI: The abdomen is soft, non-distended, non-tender to palpation.  Upper midline incision is well healed without evidence of hernia recurrence. There were no palpable masses. There was no hepatosplenomegaly. MUSCULOSKELETAL:  Normal muscle strength and tone in all four extremities.  No peripheral edema or cyanosis. SKIN: Skin turgor is normal. There are no pathologic skin lesions.  NEUROLOGIC:  Motor and sensation is grossly normal.  Cranial nerves are grossly intact. PSYCH:  Alert and oriented to person, place and time. Affect is normal.  Laboratory Analysis: Labs from 03/29/2023: Sodium 125, potassium 3.4, chloride 88, CO2 25, BUN less than 5, creatinine 0.87.  Total bilirubin 0.9, AST 47, ALT 28, alkaline phosphatase 79, albumin 2.9.  WBC 6.1, hemoglobin 15.3, hematocrit 42.3, platelets 421.  Imaging: CT abdomen/pelvis on 03/29/2023: IMPRESSION: 1. Prior gastrojejunostomy and jejunojejunostomy, stable in appearance. 2. Multiple punctate bilateral nonobstructing renal calculi. No obstructive uropathy within either kidney. 3. Otherwise no acute intra-abdominal or intrapelvic process. 4.  Aortic Atherosclerosis (ICD10-I70.0).  UGI with SBFT  03/31/2023: IMPRESSION: 1. Persistent dilated loops of small bowel associated with the gastrojejunostomy. Findings are similar to the recent upper GI examination and recent CT. 2. Contrast fills the colon after 3 hours and 30 minutes. 3. No gross abnormality to the esophagus.   Assessment and Plan: This is a 58 y.o. female with progressive issues with her gastrojejunostomy.  --Discussed with the patient the findings on her last imaging studies.  I can see some progressing dilation of the small bowel loops associated with the gastrojejunostomy and the omega loop.  She has also been having progressing issues with inability to tolerate solid foods and now is mostly having to do mushy or liquids. Her issues with taking pills is unlikely to be contributed by her intestinal issues.  It is unclear what exactly is happening with her gastrojejunostomy and why it's dilated.  Discussed that perhaps there may be adhesions which are impeding some of the food progress and resulting in a  partial obstruction of sorts.  Discussed with her that we would have to take her to the OR for possible lysis of adhesions but may also need to revise and reconstruct her gastrojejunostomy.  She may also need a feeding jejunostomy tube.  There may be a component of gastroparesis given the retained contrast in her stomach, but that would not explain the dilation of her small bowel loops. --Discussed the surgery plans at length with her.  Reviewed the surgery, hospital stay, possible need for TPN while in hospital vs tube feeds, pain control, post-op recovery, and she's willing to proceed. --Will schedule her for surgery on 05/12/23.  In the meantime, have strongly recommended that she start taking more Ensure protein supplements to help boost her caloric intake and protein intake.  This will only be helpful for the healing process. --All of her questions have been answered.  I spent 40 minutes dedicated to the care of this patient on  the date of this encounter to include pre-visit review of records, face-to-face time with the patient discussing diagnosis and management, and any post-visit coordination of care.   Howie Ill, MD [facility]

## 2023-04-19 ENCOUNTER — Telehealth: Payer: Self-pay | Admitting: Surgery

## 2023-04-19 ENCOUNTER — Ambulatory Visit: Payer: PPO | Admitting: Psychiatry

## 2023-04-19 NOTE — Telephone Encounter (Signed)
Patient has been advised of Pre-Admission date/time, and Surgery date at [facility].  Surgery Date: 05/12/23 Preadmission Testing Date: 05/04/23 (phone 8a-1p)  Patient has been made aware to call [phone], between 1-3:00pm the day before surgery, to find out what time to arrive for surgery.

## 2023-04-19 NOTE — Telephone Encounter (Signed)
Noted

## 2023-04-19 NOTE — Telephone Encounter (Signed)
Called and spoke with patient, she was able to get the medication and start taking. She stated this has helped her a lot. She wanted to let Jae Dire know she met with GI doctor and she may be having a surgery done in March to help with digesting food.

## 2023-04-26 ENCOUNTER — Other Ambulatory Visit: Payer: PPO

## 2023-04-27 ENCOUNTER — Other Ambulatory Visit: Payer: PPO

## 2023-04-28 ENCOUNTER — Other Ambulatory Visit: Payer: Self-pay | Admitting: Psychiatry

## 2023-04-28 ENCOUNTER — Encounter: Payer: Medicare HMO | Admitting: Student in an Organized Health Care Education/Training Program

## 2023-05-02 ENCOUNTER — Other Ambulatory Visit (INDEPENDENT_AMBULATORY_CARE_PROVIDER_SITE_OTHER): Payer: PPO

## 2023-05-02 ENCOUNTER — Other Ambulatory Visit: Payer: Self-pay | Admitting: Primary Care

## 2023-05-02 DIAGNOSIS — E038 Other specified hypothyroidism: Secondary | ICD-10-CM | POA: Diagnosis not present

## 2023-05-02 LAB — TSH: TSH: 235.5 u[IU]/mL — ABNORMAL HIGH (ref 0.35–5.50)

## 2023-05-02 LAB — T4, FREE: Free T4: 0.23 ng/dL — ABNORMAL LOW (ref 0.60–1.60)

## 2023-05-02 MED ORDER — LEVOTHYROXINE SODIUM 50 MCG PO TABS: ORAL_TABLET | ORAL | 0 refills | Status: DC

## 2023-05-03 NOTE — Progress Notes (Unsigned)
 Celso Amy, PA-C [address]  [address]  [place], [place] [address]  Main: [phone]  Fax: [phone]   Primary Care Physician: Doreene Nest, NP  Primary Gastroenterologist:  Celso Amy, PA-C / Dr. Midge Minium    CC: Follow-up GERD, history of peptic ulcer disease, refill medication  HPI: Tanya Nguyen is a 58 y.o. female presents to refill pantoprazole 40 Mg once daily.  GERD is controlled on this medication.  She has complicated GI history.  See below.  She is scheduled for laparoscopic surgery by Dr. Aleen Campi 05/12/2023 for exploratory laparotomy and lysis of adhesions.  She continues to have difficulty eating with unintentional weight loss.  Trying to drink protein shakes.  Feels like food does not go down well.  Gets full easily.  Also gags with eating.  She Last saw Dr. Servando Snare 02/2022 for history of peptic ulcer disease with surgery. 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 The patient had a CT scan showing thickening of the pylorus with retained food in the stomach and was recommended to see GI. The patient was set up for an upper endoscopy to rule out any pathology as the cause of her retained fluid in the stomach.   03/30/2023 EGD: - Normal esophagus. - A gastrojejunostomy was found. - Gastritis. - Foreign body in the jejunum. Not removed. - No specimens collected.  04/01/23 UGIS with SBFT: 1. Persistent dilated loops of small bowel associated with the gastrojejunostomy. Findings are similar to the recent upper GI examination and recent CT.  2. Contrast fills the colon after 3 hours and 30 minutes. 3. No gross abnormality to the esophagus.  03/09/22 EGD: Normal esophagus.  Medium food in stomach.  - A gastrojejunostomy was found. - Foreign body in the jejunum. - Biopsies Negative for H. Pylori or dysplasia.  02/2018 Colonoscopy: Fair Prep, Intermal hemorrhoids, No polyps.  10 year  repeat.  Current Outpatient Medications  Medication Sig Dispense Refill   acetaminophen (TYLENOL) 500 MG tablet Take 500-1,000 mg by mouth every 6 (six) hours as needed for moderate pain (pain score 4-6) or headache.     ARIPiprazole (ABILIFY) 2 MG tablet Take 1 tablet (2 mg total) by mouth at bedtime. 90 tablet 0   bisacodyl (DULCOLAX) 5 MG EC tablet Take 5 mg by mouth daily as needed for moderate constipation.     diphenhydrAMINE (BENADRYL) 25 MG tablet Take 25 mg by mouth every 6 (six) hours as needed for allergies.     hydrOXYzine (ATARAX) 25 MG tablet Take 0.5-1 tablets (12.5-25 mg total) by mouth at bedtime as needed. (Patient taking differently: Take 25 mg by mouth at bedtime.) 30 tablet 0   levothyroxine (SYNTHROID) 50 MCG tablet Take 1 tablet by mouth every morning on an empty stomach with water only.  No food or other medications for 30 minutes. 90 tablet 0   mirtazapine (REMERON) 15 MG tablet 7.5 mg at night for one week, then 15 mg at night (Patient taking differently: Take 15 mg by mouth at bedtime.) 30 tablet 1   Multiple Vitamin (MULTIVITAMIN WITH MINERALS) TABS tablet Take 1 tablet by mouth daily.     ondansetron (ZOFRAN-ODT) 4 MG disintegrating tablet Take 1 tablet (4 mg total) by mouth every 8 (eight) hours as needed for nausea or vomiting. 20 tablet 0   oxybutynin (DITROPAN-XL) 5 MG 24 hr tablet TAKE 1 TABLET (5 MG TOTAL) BY MOUTH AT BEDTIME. FOR  BLADDER INCONTINENCE. 90 tablet 2   pantoprazole (PROTONIX) 40 MG tablet Take 1 tablet (40 mg total) by mouth daily. 90 tablet 0   SUMAtriptan (IMITREX) 50 MG tablet Take 1 tablet at migraine onset. May repeat in 2 hours if headache persists or recurs. 9 tablet 0   traMADol (ULTRAM) 50 MG tablet Take 1 tablet (50 mg total) by mouth every 12 (twelve) hours as needed. 60 tablet 2   No current facility-administered medications for this visit.    Allergies as of 05/04/2023 - Review Complete 04/27/2023  Allergen Reaction Noted    Penicillins Shortness Of Breath, Diarrhea, and Nausea And Vomiting 02/26/2015   Nsaids Other (See Comments) 06/26/2012   Gabapentin Other (See Comments) 11/14/2018   Pregabalin  04/21/2020   Sulfa antibiotics Nausea And Vomiting 06/26/2012    Past Medical History:  Diagnosis Date   Acquired pyloric stricture    Bleeding duodenal ulcer    CAP (community acquired pneumonia) 02/12/2020   Chronic esophagogastric ulcer    Constipation 04/02/2014   Depression    Duodenal obstruction    Gastric outlet obstruction 01/19/2018   GERD (gastroesophageal reflux disease)    Hair loss 09/22/2018   Head injury 12/24/2022   History of stomach ulcers    Hypothyroidism    Incisional hernia, without obstruction or gangrene    Intractable vomiting    Kidney stones    Melena 05/08/2021   Migraine    Migraine headache    only1-2x/month since starting topamax   Neuropathy    Nodule of middle lobe of right lung 09/17/2021   NSAID long-term use 09/16/2016   Right-sided chest pain 09/17/2021   Sacroiliac joint pain 08/14/2018   Side pain 03/30/2018   Upper GI bleed 05/18/2021    Past Surgical History:  Procedure Laterality Date   ABDOMINAL HYSTERECTOMY  2004   partial   CHOLECYSTECTOMY     COLONOSCOPY WITH PROPOFOL N/A 02/27/2018   Procedure: COLONOSCOPY WITH PROPOFOL;  Surgeon: Midge Minium, MD;  Location: [facility];  Service: Endoscopy;  Laterality: N/A;   ESOPHAGOGASTRODUODENOSCOPY (EGD) WITH PROPOFOL N/A 01/12/2018   Procedure: ESOPHAGOGASTRODUODENOSCOPY (EGD) WITH BIOPSIES;  Surgeon: Midge Minium, MD;  Location: [facility] SURGERY CNTR;  Service: Endoscopy;  Laterality: N/A;   ESOPHAGOGASTRODUODENOSCOPY (EGD) WITH PROPOFOL N/A 01/25/2018   Procedure: ESOPHAGOGASTRODUODENOSCOPY (EGD) WITH PROPOFOL;  Surgeon: Wyline Mood, MD;  Location: [facility] ENDOSCOPY;  Service: Gastroenterology;  Laterality: N/A;   ESOPHAGOGASTRODUODENOSCOPY (EGD) WITH PROPOFOL N/A 02/27/2018   Procedure:  ESOPHAGOGASTRODUODENOSCOPY (EGD) WITH PROPOFOL;  Surgeon: Midge Minium, MD;  Location: [facility] SURGERY CNTR;  Service: Endoscopy;  Laterality: N/A;   ESOPHAGOGASTRODUODENOSCOPY (EGD) WITH PROPOFOL N/A 05/09/2021   Procedure: ESOPHAGOGASTRODUODENOSCOPY (EGD) WITH PROPOFOL;  Surgeon: Jaynie Collins, DO;  Location: [facility] ENDOSCOPY;  Service: Gastroenterology;  Laterality: N/A;   ESOPHAGOGASTRODUODENOSCOPY (EGD) WITH PROPOFOL N/A 03/09/2022   Procedure: ESOPHAGOGASTRODUODENOSCOPY (EGD) WITH PROPOFOL;  Surgeon: Midge Minium, MD;  Location: [facility] ENDOSCOPY;  Service: Endoscopy;  Laterality: N/A;   ESOPHAGOGASTRODUODENOSCOPY (EGD) WITH PROPOFOL N/A 03/30/2023   Procedure: ESOPHAGOGASTRODUODENOSCOPY (EGD) WITH PROPOFOL;  Surgeon: Midge Minium, MD;  Location: [facility] ENDOSCOPY;  Service: Endoscopy;  Laterality: N/A;   INCISIONAL HERNIA REPAIR N/A 11/23/2018   Procedure: HERNIA REPAIR INCISIONAL;  Surgeon: Henrene Dodge, MD;  Location: [facility] ORS;  Service: General;  Laterality: N/A;   INSERTION OF MESH N/A 11/23/2018   Procedure: INSERTION OF MESH;  Surgeon: Henrene Dodge, MD;  Location: [facility] ORS;  Service: General;  Laterality: N/A;   LAPAROTOMY N/A 01/20/2018  Procedure: EXPLORATORY LAPAROTOMY;  Surgeon: Henrene Dodge, MD;  Location: [facility] ORS;  Service: General;  Laterality: N/A;   TOOTH EXTRACTION      Review of Systems:    All systems reviewed and negative except where noted in HPI.   Physical Examination:   There were no vitals taken for this visit.  General: Well-nourished, very thin, well-developed in no acute distress.  Lungs: Clear to auscultation bilaterally. Non-labored. Heart: Regular rate and rhythm, no murmurs rubs or gallops.  Abdomen: Bowel sounds are normal; Abdomen is Soft and thin; No hepatosplenomegaly, masses or hernias;  No Abdominal Tenderness; No guarding or rebound tenderness.  Multiple surgical scars over the central abdomen. Neuro: Alert and oriented x 3.  Grossly intact.   Psych: Alert and cooperative, normal mood and affect.  Imaging Studies: No results found.  Assessment and Plan:   Tanya Nguyen is a 58 y.o. y/o female returns for follow-up of:  1.  GERD 2.  History of peptic ulcer disease 3.  Gastritis (H. pylori negative) 4.  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  Plan: -Remain on PPI indefinitely.  I refilled pantoprazole 40 Mg 1 tablet daily, #90, 3 refills. -She will continue with plan for surgery with Dr. Aleen Campi 05/12/2023 laparoscopic lysis of adhesions. -If she has worsening GI symptoms, refer to tertiary care center. -Encouraged her to drink protein shakes with increase caloric intake.  Small frequent meals.  Celso Amy, PA-C  Follow up in 1 year or sooner if worsening GI symptoms.

## 2023-05-04 ENCOUNTER — Ambulatory Visit: Payer: PPO | Admitting: Physician Assistant

## 2023-05-04 ENCOUNTER — Encounter
Admission: RE | Admit: 2023-05-04 | Discharge: 2023-05-04 | Disposition: A | Discharge status: Home / Self Care | Payer: PPO | Source: Ambulatory Visit | Attending: Surgery | Admitting: Surgery

## 2023-05-04 ENCOUNTER — Encounter: Payer: Self-pay | Admitting: Physician Assistant

## 2023-05-04 VITALS — BP 118/74 | HR 113 | Temp 97.8°F | Wt 78.0 lb

## 2023-05-04 DIAGNOSIS — Z934 Other artificial openings of gastrointestinal tract status: Secondary | ICD-10-CM | POA: Diagnosis not present

## 2023-05-04 DIAGNOSIS — Z01812 Encounter for preprocedural laboratory examination: Secondary | ICD-10-CM | POA: Diagnosis not present

## 2023-05-04 DIAGNOSIS — K2970 Gastritis, unspecified, without bleeding: Secondary | ICD-10-CM

## 2023-05-04 DIAGNOSIS — R112 Nausea with vomiting, unspecified: Secondary | ICD-10-CM | POA: Insufficient documentation

## 2023-05-04 DIAGNOSIS — R197 Diarrhea, unspecified: Secondary | ICD-10-CM | POA: Insufficient documentation

## 2023-05-04 DIAGNOSIS — Z01818 Encounter for other preprocedural examination: Secondary | ICD-10-CM | POA: Diagnosis present

## 2023-05-04 DIAGNOSIS — K219 Gastro-esophageal reflux disease without esophagitis: Secondary | ICD-10-CM

## 2023-05-04 DIAGNOSIS — Z8711 Personal history of peptic ulcer disease: Secondary | ICD-10-CM | POA: Diagnosis not present

## 2023-05-04 DIAGNOSIS — K2930 Chronic superficial gastritis without bleeding: Secondary | ICD-10-CM

## 2023-05-04 DIAGNOSIS — Z980 Intestinal bypass and anastomosis status: Secondary | ICD-10-CM | POA: Insufficient documentation

## 2023-05-04 HISTORY — DX: Vitamin D deficiency, unspecified: E55.9

## 2023-05-04 HISTORY — DX: Thrombocytosis, unspecified: D75.839

## 2023-05-04 HISTORY — DX: Iron deficiency anemia, unspecified: D50.9

## 2023-05-04 HISTORY — DX: Chronic pain syndrome: G89.4

## 2023-05-04 HISTORY — DX: Bipolar disorder, unspecified: F31.9

## 2023-05-04 HISTORY — DX: Unspecified severe protein-calorie malnutrition: E43

## 2023-05-04 HISTORY — DX: Low back pain, unspecified: M54.50

## 2023-05-04 HISTORY — DX: Deficiency of other specified B group vitamins: E53.8

## 2023-05-04 HISTORY — DX: Other chronic pain: G89.29

## 2023-05-04 HISTORY — DX: Post-traumatic stress disorder, unspecified: F43.10

## 2023-05-04 HISTORY — DX: Other postprocedural complications and disorders of digestive system: K91.89

## 2023-05-04 HISTORY — DX: Prediabetes: R73.03

## 2023-05-04 LAB — TYPE AND SCREEN
ABO/RH(D): O POS
Antibody Screen: NEGATIVE

## 2023-05-04 MED ORDER — PANTOPRAZOLE SODIUM 40 MG PO TBEC: 40.0000 mg | DELAYED_RELEASE_TABLET | Freq: Every day | ORAL | 3 refills | Status: AC

## 2023-05-04 NOTE — Patient Instructions (Addendum)
 Your procedure is scheduled on:05-12-23 Thursday Report to the Registration Desk on the 1st floor of the [facility].Then proceed to the 2nd floor Surgery Desk To find out your arrival time, please call [phone] between 1PM - 3PM on:05-11-23 Wednesday If your arrival time is 6:00 am, do not arrive before that time as the [facility] entrance doors do not open until 6:00 am.  REMEMBER: Instructions that are not followed completely may result in serious medical risk, up to and including death; or upon the discretion of your surgeon and anesthesiologist your surgery may need to be rescheduled.  Do not eat food after midnight the night before surgery.  No gum chewing or hard candies.  You may however, drink CLEAR liquids up to 2 hours before you are scheduled to arrive for your surgery. Do not drink anything within 2 hours of your scheduled arrival time.  Clear liquids include: - water  - apple juice without pulp - gatorade (not RED colors) - black coffee or tea (Do NOT add milk or creamers to the coffee or tea) Do NOT drink anything that is not on this list.  One week prior to surgery:Stop NOW (05-04-23) Stop Anti-inflammatories (NSAIDS) such as Advil, Aleve, Ibuprofen, Motrin, Naproxen, Naprosyn and Aspirin based products such as Excedrin, Goody's Powder, BC Powder. Stop ANY OVER THE COUNTER supplements until after surgery (Multivitamin)  You may however, continue to take Tylenol/Tramadol if needed for pain up until the day of surgery.  Continue taking all of your other prescription medications up until the day of surgery.  ON THE DAY OF SURGERY ONLY TAKE THESE MEDICATIONS WITH SIPS OF WATER: -levothyroxine (SYNTHROID)  -pantoprazole (PROTONIX)   No Alcohol for 24 hours before or after surgery.  No Smoking including e-cigarettes for 24 hours before surgery.  No chewable tobacco products for at least 6 hours before surgery.  No nicotine patches on the day of surgery.  Do not use  any "recreational" drugs for at least a week (preferably 2 weeks) before your surgery.  Please be advised that the combination of cocaine and anesthesia may have negative outcomes, up to and including death. If you test positive for cocaine, your surgery will be cancelled.  On the morning of surgery brush your teeth with toothpaste and water, you may rinse your mouth with mouthwash if you wish. Do not swallow any toothpaste or mouthwash.  Use CHG Soap as directed on instruction sheet.  Do not wear jewelry, make-up, hairpins, clips or nail polish.  For welded (permanent) jewelry: bracelets, anklets, waist bands, etc.  Please have this removed prior to surgery.  If it is not removed, there is a chance that hospital personnel will need to cut it off on the day of surgery.  Do not wear lotions, powders, or perfumes.   Do not shave body hair from the neck down 48 hours before surgery.  Contact lenses, hearing aids and dentures may not be worn into surgery.  Do not bring valuables to the hospital. [facility] is not responsible for any missing/lost belongings or valuables.   Notify your doctor if there is any change in your medical condition (cold, fever, infection).  Wear comfortable clothing (specific to your surgery type) to the hospital.  After surgery, you can help prevent lung complications by doing breathing exercises.  Take deep breaths and cough every 1-2 hours. Your doctor may order a device called an Incentive Spirometer to help you take deep breaths. When coughing or sneezing, hold a pillow  firmly against your incision with both hands. This is called "splinting." Doing this helps protect your incision. It also decreases belly discomfort.  If you are being admitted to the hospital overnight, leave your suitcase in the car. After surgery it may be brought to your room.  In case of increased patient census, it may be necessary for you, the patient, to continue your postoperative  care in the Same Day Surgery department.  If you are being discharged the day of surgery, you will not be allowed to drive home. You will need a responsible individual to drive you home and stay with you for 24 hours after surgery.   If you are taking public transportation, you will need to have a responsible individual with you.  Please call the Pre-admissions Testing Dept. at [phone] if you have any questions about these instructions.  Surgery Visitation Policy:  Patients having surgery or a procedure may have two visitors.  Children under the age of 71 must have an adult with them who is not the patient.  Temporary Visitor Restrictions Due to increasing cases of flu, RSV and COVID-19: Children ages 57 and under will not be able to visit patients in [facility] hospitals under most circumstances.  Inpatient Visitation:    Visiting hours are 7 a.m. to 8 p.m. Up to four visitors are allowed at one time in a patient room. The visitors may rotate out with other people during the day.  One visitor age 27 or older may stay with the patient overnight and must be in the room by 8 p.m.     Preparing for Surgery with CHLORHEXIDINE GLUCONATE (CHG) Soap  Chlorhexidine Gluconate (CHG) Soap  o An antiseptic cleaner that kills germs and bonds with the skin to continue killing germs even after washing  o Used for showering the night before surgery and morning of surgery  Before surgery, you can play an important role by reducing the number of germs on your skin.  CHG (Chlorhexidine gluconate) soap is an antiseptic cleanser which kills germs and bonds with the skin to continue killing germs even after washing.  Please do not use if you have an allergy to CHG or antibacterial soaps. If your skin becomes reddened/irritated stop using the CHG.  1. Shower the NIGHT BEFORE SURGERY and the MORNING OF SURGERY with CHG soap.  2. If you choose to wash your hair, wash your hair first as usual  with your normal shampoo.  3. After shampooing, rinse your hair and body thoroughly to remove the shampoo.  4. Use CHG as you would any other liquid soap. You can apply CHG directly to the skin and wash gently with a scrungie or a clean washcloth.  5. Apply the CHG soap to your body only from the neck down. Do not use on open wounds or open sores. Avoid contact with your eyes, ears, mouth, and genitals (private parts). Wash face and genitals (private parts) with your normal soap.  6. Wash thoroughly, paying special attention to the area where your surgery will be performed.  7. Thoroughly rinse your body with warm water.  8. Do not shower/wash with your normal soap after using and rinsing off the CHG soap.  9. Pat yourself dry with a clean towel.  10. Wear clean pajamas to bed the night before surgery.  12. Place clean sheets on your bed the night of your first shower and do not sleep with pets.  13. Shower again with the CHG soap  on the day of surgery prior to arriving at the hospital.  14. Do not apply any deodorants/lotions/powders.  15. Please wear clean clothes to the hospital.

## 2023-05-05 ENCOUNTER — Other Ambulatory Visit: Payer: Self-pay | Admitting: Primary Care

## 2023-05-05 ENCOUNTER — Other Ambulatory Visit: Payer: Self-pay | Admitting: Psychiatry

## 2023-05-05 DIAGNOSIS — F3341 Major depressive disorder, recurrent, in partial remission: Secondary | ICD-10-CM

## 2023-05-05 DIAGNOSIS — E038 Other specified hypothyroidism: Secondary | ICD-10-CM

## 2023-05-06 ENCOUNTER — Other Ambulatory Visit: Payer: Self-pay | Admitting: Psychiatry

## 2023-05-06 ENCOUNTER — Telehealth: Payer: Self-pay

## 2023-05-06 ENCOUNTER — Other Ambulatory Visit: Payer: PPO

## 2023-05-06 MED ORDER — HYDROXYZINE HCL 25 MG PO TABS: 25.0000 mg | ORAL_TABLET | Freq: Every day | ORAL | 0 refills | Status: DC | PRN

## 2023-05-06 NOTE — Telephone Encounter (Signed)
 Thank you. Hydroxyzine is sent.

## 2023-05-06 NOTE — Telephone Encounter (Signed)
 Patient called to request a refill for Mirtazapine 15 mg and Hydroxyzine 25 mg called patient to make aware that she has a refill remaining for the Mirtazapine 15 mg and that she just needs to call the pharmacy sent refill for the Hydroxyzine 25 mg to the provider  Last visit 04-07-23 Next visit 05-31-23  Preferred pharmacy  [id number] [place], [place] - [address] Phone: [phone]  Fax: [phone]

## 2023-05-11 MED ORDER — ARIPIPRAZOLE 2 MG PO TABS: 2.0000 mg | ORAL_TABLET | Freq: Every day | ORAL | 0 refills | Status: DC

## 2023-05-11 NOTE — Telephone Encounter (Signed)
 I have sent Abilify 2 mg 90 days supply to pharmacy.  Although this request was sent by pharmacy on 04/28/2023 I did not receive this refill request until today 05/11/2023 due to system issues, until IT was able to fix the problem.

## 2023-05-11 NOTE — Telephone Encounter (Signed)
 I have sent mirtazapine 15 mg - 90 days supply to pharmacy at [facility].  (Although pharmacy sent this request on 05/05/2023, due to technical issues I did not receive this refill request until today, until IT was able to fix the problem.)

## 2023-05-12 ENCOUNTER — Other Ambulatory Visit: Payer: Self-pay

## 2023-05-12 ENCOUNTER — Inpatient Hospital Stay: Payer: Self-pay | Admitting: Urgent Care

## 2023-05-12 ENCOUNTER — Encounter
Admission: RE | Disposition: A | Discharge status: Home Health | Payer: Self-pay | Source: Home / Self Care | Attending: Surgery

## 2023-05-12 ENCOUNTER — Encounter: Payer: Self-pay | Admitting: Surgery

## 2023-05-12 ENCOUNTER — Inpatient Hospital Stay
Admission: RE | Admit: 2023-05-12 | Discharge: 2023-05-27 | DRG: 328 | Disposition: A | Discharge status: Home Health | Payer: PPO | Attending: Surgery | Admitting: Surgery

## 2023-05-12 ENCOUNTER — Inpatient Hospital Stay: Admitting: Anesthesiology

## 2023-05-12 DIAGNOSIS — Z980 Intestinal bypass and anastomosis status: Secondary | ICD-10-CM | POA: Diagnosis not present

## 2023-05-12 DIAGNOSIS — Z823 Family history of stroke: Secondary | ICD-10-CM | POA: Diagnosis not present

## 2023-05-12 DIAGNOSIS — R140 Abdominal distension (gaseous): Secondary | ICD-10-CM | POA: Diagnosis not present

## 2023-05-12 DIAGNOSIS — Z79890 Hormone replacement therapy (postmenopausal): Secondary | ICD-10-CM | POA: Diagnosis not present

## 2023-05-12 DIAGNOSIS — K315 Obstruction of duodenum: Secondary | ICD-10-CM | POA: Diagnosis not present

## 2023-05-12 DIAGNOSIS — Z888 Allergy status to other drugs, medicaments and biological substances status: Secondary | ICD-10-CM

## 2023-05-12 DIAGNOSIS — K660 Peritoneal adhesions (postprocedural) (postinfection): Secondary | ICD-10-CM | POA: Diagnosis not present

## 2023-05-12 DIAGNOSIS — K9423 Gastrostomy malfunction: Principal | ICD-10-CM | POA: Diagnosis present

## 2023-05-12 DIAGNOSIS — R112 Nausea with vomiting, unspecified: Secondary | ICD-10-CM | POA: Diagnosis not present

## 2023-05-12 DIAGNOSIS — Z934 Other artificial openings of gastrointestinal tract status: Secondary | ICD-10-CM | POA: Diagnosis not present

## 2023-05-12 DIAGNOSIS — Z882 Allergy status to sulfonamides status: Secondary | ICD-10-CM | POA: Diagnosis not present

## 2023-05-12 DIAGNOSIS — Z803 Family history of malignant neoplasm of breast: Secondary | ICD-10-CM

## 2023-05-12 DIAGNOSIS — Z886 Allergy status to analgesic agent status: Secondary | ICD-10-CM

## 2023-05-12 DIAGNOSIS — K9413 Enterostomy malfunction: Secondary | ICD-10-CM

## 2023-05-12 DIAGNOSIS — Z811 Family history of alcohol abuse and dependence: Secondary | ICD-10-CM

## 2023-05-12 DIAGNOSIS — Z880 Allergy status to penicillin: Secondary | ICD-10-CM | POA: Diagnosis not present

## 2023-05-12 DIAGNOSIS — K219 Gastro-esophageal reflux disease without esophagitis: Secondary | ICD-10-CM | POA: Diagnosis present

## 2023-05-12 DIAGNOSIS — Z813 Family history of other psychoactive substance abuse and dependence: Secondary | ICD-10-CM

## 2023-05-12 DIAGNOSIS — Z809 Family history of malignant neoplasm, unspecified: Secondary | ICD-10-CM

## 2023-05-12 DIAGNOSIS — R935 Abnormal findings on diagnostic imaging of other abdominal regions, including retroperitoneum: Secondary | ICD-10-CM | POA: Diagnosis not present

## 2023-05-12 DIAGNOSIS — Z8711 Personal history of peptic ulcer disease: Secondary | ICD-10-CM | POA: Diagnosis not present

## 2023-05-12 DIAGNOSIS — R188 Other ascites: Secondary | ICD-10-CM | POA: Diagnosis not present

## 2023-05-12 DIAGNOSIS — N200 Calculus of kidney: Secondary | ICD-10-CM | POA: Diagnosis not present

## 2023-05-12 DIAGNOSIS — E43 Unspecified severe protein-calorie malnutrition: Secondary | ICD-10-CM | POA: Diagnosis present

## 2023-05-12 DIAGNOSIS — K311 Adult hypertrophic pyloric stenosis: Secondary | ICD-10-CM | POA: Diagnosis not present

## 2023-05-12 DIAGNOSIS — Z4682 Encounter for fitting and adjustment of non-vascular catheter: Secondary | ICD-10-CM | POA: Diagnosis not present

## 2023-05-12 DIAGNOSIS — Z90710 Acquired absence of both cervix and uterus: Secondary | ICD-10-CM

## 2023-05-12 DIAGNOSIS — E876 Hypokalemia: Secondary | ICD-10-CM | POA: Diagnosis present

## 2023-05-12 DIAGNOSIS — Z681 Body mass index (BMI) 19 or less, adult: Secondary | ICD-10-CM

## 2023-05-12 DIAGNOSIS — Z818 Family history of other mental and behavioral disorders: Secondary | ICD-10-CM

## 2023-05-12 DIAGNOSIS — Z87442 Personal history of urinary calculi: Secondary | ICD-10-CM

## 2023-05-12 DIAGNOSIS — R197 Diarrhea, unspecified: Secondary | ICD-10-CM | POA: Diagnosis not present

## 2023-05-12 DIAGNOSIS — Z79899 Other long term (current) drug therapy: Secondary | ICD-10-CM

## 2023-05-12 DIAGNOSIS — D72829 Elevated white blood cell count, unspecified: Secondary | ICD-10-CM | POA: Diagnosis not present

## 2023-05-12 DIAGNOSIS — K30 Functional dyspepsia: Secondary | ICD-10-CM | POA: Diagnosis not present

## 2023-05-12 DIAGNOSIS — R32 Unspecified urinary incontinence: Secondary | ICD-10-CM | POA: Diagnosis present

## 2023-05-12 DIAGNOSIS — Z801 Family history of malignant neoplasm of trachea, bronchus and lung: Secondary | ICD-10-CM | POA: Diagnosis not present

## 2023-05-12 HISTORY — PX: GASTROJEJUNOSTOMY: SHX1697

## 2023-05-12 HISTORY — PX: LYSIS OF ADHESION: SHX5961

## 2023-05-12 SURGERY — LAPAROTOMY, FOR LYSIS OF ADHESIONS
Anesthesia: General

## 2023-05-12 MED ORDER — SUGAMMADEX SODIUM 200 MG/2ML IV SOLN
INTRAVENOUS | Status: AC
  Filled 2023-05-12: qty 2

## 2023-05-12 MED ORDER — ACETAMINOPHEN 10 MG/ML IV SOLN
INTRAVENOUS | Status: AC
  Filled 2023-05-12: qty 100

## 2023-05-12 MED ORDER — OXYCODONE HCL 5 MG PO TABS: 5.0000 mg | ORAL_TABLET | Freq: Once | ORAL | Status: DC | PRN

## 2023-05-12 MED ORDER — ACETAMINOPHEN 10 MG/ML IV SOLN
1000.0000 mg | Freq: Three times a day (TID) | INTRAVENOUS | Status: AC
  Administered 2023-05-12 – 2023-05-13 (×3): 1000 mg via INTRAVENOUS
  Filled 2023-05-12 (×2): qty 100

## 2023-05-12 MED ORDER — PHENYLEPHRINE 80 MCG/ML (10ML) SYRINGE FOR IV PUSH (FOR BLOOD PRESSURE SUPPORT)
PREFILLED_SYRINGE | INTRAVENOUS | Status: AC
  Filled 2023-05-12: qty 10

## 2023-05-12 MED ORDER — SODIUM CHLORIDE 0.9 % IV SOLN: INTRAVENOUS | Status: AC

## 2023-05-12 MED ORDER — CHLORHEXIDINE GLUCONATE 0.12 % MT SOLN
OROMUCOSAL | Status: AC
  Filled 2023-05-12: qty 15

## 2023-05-12 MED ORDER — SUCCINYLCHOLINE CHLORIDE 200 MG/10ML IV SOSY
PREFILLED_SYRINGE | INTRAVENOUS | Status: DC | PRN
  Administered 2023-05-12: 50 mg via INTRAVENOUS

## 2023-05-12 MED ORDER — POLYETHYLENE GLYCOL 3350 17 G PO PACK: 17.0000 g | PACK | Freq: Every day | ORAL | Status: DC | PRN

## 2023-05-12 MED ORDER — OSMOLITE 1.2 CAL PO LIQD
1000.0000 mL | ORAL | Status: DC
  Administered 2023-05-13 – 2023-05-22 (×7): 1000 mL
  Filled 2023-05-12: qty 1000

## 2023-05-12 MED ORDER — METRONIDAZOLE 500 MG/100ML IV SOLN
500.0000 mg | INTRAVENOUS | Status: DC
  Filled 2023-05-12: qty 100

## 2023-05-12 MED ORDER — LEVOTHYROXINE SODIUM 50 MCG PO TABS
50.0000 ug | ORAL_TABLET | Freq: Every day | ORAL | Status: DC
  Administered 2023-05-14 – 2023-05-15 (×2): 50 ug via ORAL
  Filled 2023-05-12 (×2): qty 1

## 2023-05-12 MED ORDER — PROPOFOL 10 MG/ML IV BOLUS
INTRAVENOUS | Status: DC | PRN
  Administered 2023-05-12: 80 mg via INTRAVENOUS

## 2023-05-12 MED ORDER — ACETAMINOPHEN 10 MG/ML IV SOLN
INTRAVENOUS | Status: DC | PRN
  Administered 2023-05-12: 500 mg via INTRAVENOUS

## 2023-05-12 MED ORDER — LACTATED RINGERS IV SOLN: INTRAVENOUS | Status: DC

## 2023-05-12 MED ORDER — SUGAMMADEX SODIUM 200 MG/2ML IV SOLN
INTRAVENOUS | Status: DC | PRN
Start: 2023-05-12 — End: 2023-05-12
  Administered 2023-05-12: 100 mg via INTRAVENOUS

## 2023-05-12 MED ORDER — MIDAZOLAM HCL 2 MG/2ML IJ SOLN
INTRAMUSCULAR | Status: AC
  Filled 2023-05-12: qty 2

## 2023-05-12 MED ORDER — MIRTAZAPINE 15 MG PO TABS
15.0000 mg | ORAL_TABLET | Freq: Every day | ORAL | Status: DC
  Administered 2023-05-13 – 2023-05-14 (×2): 15 mg via ORAL
  Filled 2023-05-12 (×2): qty 1

## 2023-05-12 MED ORDER — HYDROMORPHONE HCL 1 MG/ML IJ SOLN
INTRAMUSCULAR | Status: DC | PRN
  Administered 2023-05-12: .5 mg via INTRAVENOUS

## 2023-05-12 MED ORDER — ORAL CARE MOUTH RINSE: 15.0000 mL | Freq: Once | OROMUCOSAL | Status: AC

## 2023-05-12 MED ORDER — ACETAMINOPHEN 500 MG PO TABS: 1000.0000 mg | ORAL_TABLET | ORAL | Status: DC

## 2023-05-12 MED ORDER — BUPIVACAINE LIPOSOME 1.3 % IJ SUSP
20.0000 mL | Freq: Once | INTRAMUSCULAR | Status: DC
  Administered 2023-05-12: 266 mg

## 2023-05-12 MED ORDER — CEFOTETAN DISODIUM 2 G IJ SOLR
INTRAMUSCULAR | Status: AC
  Filled 2023-05-12: qty 2

## 2023-05-12 MED ORDER — SODIUM CHLORIDE 0.9 % IV SOLN
2.0000 g | Freq: Three times a day (TID) | INTRAVENOUS | Status: AC
  Administered 2023-05-12 – 2023-05-13 (×3): 2 g via INTRAVENOUS
  Filled 2023-05-12: qty 2

## 2023-05-12 MED ORDER — CHLORHEXIDINE GLUCONATE 0.12 % MT SOLN
15.0000 mL | Freq: Once | OROMUCOSAL | Status: AC
  Administered 2023-05-12: 15 mL via OROMUCOSAL

## 2023-05-12 MED ORDER — PHENYLEPHRINE 80 MCG/ML (10ML) SYRINGE FOR IV PUSH (FOR BLOOD PRESSURE SUPPORT)
PREFILLED_SYRINGE | INTRAVENOUS | Status: DC | PRN
  Administered 2023-05-12 (×3): 80 ug via INTRAVENOUS
  Administered 2023-05-12 (×2): 160 ug via INTRAVENOUS

## 2023-05-12 MED ORDER — ROCURONIUM BROMIDE 100 MG/10ML IV SOLN
INTRAVENOUS | Status: DC | PRN
Start: 2023-05-12 — End: 2023-05-12
  Administered 2023-05-12: 30 mg via INTRAVENOUS
  Administered 2023-05-12: 20 mg via INTRAVENOUS

## 2023-05-12 MED ORDER — GABAPENTIN 300 MG PO CAPS
ORAL_CAPSULE | ORAL | Status: AC
  Filled 2023-05-12: qty 1

## 2023-05-12 MED ORDER — DEXAMETHASONE SODIUM PHOSPHATE 10 MG/ML IJ SOLN
INTRAMUSCULAR | Status: AC
  Filled 2023-05-12: qty 1

## 2023-05-12 MED ORDER — 0.9 % SODIUM CHLORIDE (POUR BTL) OPTIME
TOPICAL | Status: DC | PRN
  Administered 2023-05-12: 500 mL

## 2023-05-12 MED ORDER — ONDANSETRON HCL 4 MG/2ML IJ SOLN
4.0000 mg | Freq: Four times a day (QID) | INTRAMUSCULAR | Status: DC | PRN
  Administered 2023-05-13 – 2023-05-27 (×38): 4 mg via INTRAVENOUS
  Filled 2023-05-12 (×38): qty 2

## 2023-05-12 MED ORDER — ONDANSETRON HCL 4 MG/2ML IJ SOLN
INTRAMUSCULAR | Status: DC | PRN
  Administered 2023-05-12: 4 mg via INTRAVENOUS

## 2023-05-12 MED ORDER — DEXAMETHASONE SODIUM PHOSPHATE 10 MG/ML IJ SOLN
INTRAMUSCULAR | Status: DC | PRN
Start: 2023-05-12 — End: 2023-05-12
  Administered 2023-05-12: 10 mg via INTRAVENOUS

## 2023-05-12 MED ORDER — ARIPIPRAZOLE 2 MG PO TABS
2.0000 mg | ORAL_TABLET | Freq: Every day | ORAL | Status: DC
  Administered 2023-05-13 – 2023-05-14 (×2): 2 mg via ORAL
  Filled 2023-05-12 (×2): qty 1

## 2023-05-12 MED ORDER — MIDAZOLAM HCL 2 MG/2ML IJ SOLN
INTRAMUSCULAR | Status: DC | PRN
Start: 2023-05-12 — End: 2023-05-12
  Administered 2023-05-12 (×2): 1 mg via INTRAVENOUS

## 2023-05-12 MED ORDER — FENTANYL CITRATE (PF) 100 MCG/2ML IJ SOLN
INTRAMUSCULAR | Status: AC
  Filled 2023-05-12: qty 2

## 2023-05-12 MED ORDER — GABAPENTIN 100 MG PO CAPS
ORAL_CAPSULE | ORAL | Status: AC
  Filled 2023-05-12: qty 2

## 2023-05-12 MED ORDER — LIDOCAINE HCL (CARDIAC) PF 100 MG/5ML IV SOSY
PREFILLED_SYRINGE | INTRAVENOUS | Status: DC | PRN
  Administered 2023-05-12: 30 mg via INTRAVENOUS

## 2023-05-12 MED ORDER — DEXTROSE 5 % IV SOLN
1.5000 g | Freq: Three times a day (TID) | INTRAVENOUS | Status: DC
  Filled 2023-05-12: qty 1.5

## 2023-05-12 MED ORDER — CHLORHEXIDINE GLUCONATE CLOTH 2 % EX PADS
6.0000 | MEDICATED_PAD | Freq: Once | CUTANEOUS | Status: AC
  Administered 2023-05-12: 6 via TOPICAL

## 2023-05-12 MED ORDER — FREE WATER
30.0000 mL | Status: DC
  Administered 2023-05-13 – 2023-05-24 (×67): 30 mL

## 2023-05-12 MED ORDER — SUCCINYLCHOLINE CHLORIDE 200 MG/10ML IV SOSY
PREFILLED_SYRINGE | INTRAVENOUS | Status: AC
  Filled 2023-05-12: qty 10

## 2023-05-12 MED ORDER — OXYCODONE HCL 5 MG/5ML PO SOLN: 5.0000 mg | Freq: Once | ORAL | Status: DC | PRN

## 2023-05-12 MED ORDER — FENTANYL CITRATE (PF) 100 MCG/2ML IJ SOLN
INTRAMUSCULAR | Status: DC | PRN
  Administered 2023-05-12: 50 ug via INTRAVENOUS
  Administered 2023-05-12 (×2): 25 ug via INTRAVENOUS

## 2023-05-12 MED ORDER — ONDANSETRON HCL 4 MG/2ML IJ SOLN
INTRAMUSCULAR | Status: AC
  Filled 2023-05-12: qty 2

## 2023-05-12 MED ORDER — HYDROMORPHONE HCL 1 MG/ML IJ SOLN
INTRAMUSCULAR | Status: AC
  Filled 2023-05-12: qty 1

## 2023-05-12 MED ORDER — PANTOPRAZOLE SODIUM 40 MG IV SOLR
40.0000 mg | Freq: Every day | INTRAVENOUS | Status: DC
  Administered 2023-05-12 – 2023-05-26 (×15): 40 mg via INTRAVENOUS
  Filled 2023-05-12 (×14): qty 10

## 2023-05-12 MED ORDER — CHLORHEXIDINE GLUCONATE CLOTH 2 % EX PADS
6.0000 | MEDICATED_PAD | Freq: Once | CUTANEOUS | Status: DC
  Administered 2023-05-12: 6 via TOPICAL

## 2023-05-12 MED ORDER — FENTANYL CITRATE (PF) 100 MCG/2ML IJ SOLN
INTRAMUSCULAR | Status: AC
Start: 2023-05-12 — End: ?
  Filled 2023-05-12: qty 2

## 2023-05-12 MED ORDER — HYDROMORPHONE HCL 1 MG/ML IJ SOLN
INTRAMUSCULAR | Status: AC
  Filled 2023-05-12: qty 0.5

## 2023-05-12 MED ORDER — ALVIMOPAN 12 MG PO CAPS
ORAL_CAPSULE | ORAL | Status: AC
  Filled 2023-05-12: qty 1

## 2023-05-12 MED ORDER — HYDROMORPHONE HCL 1 MG/ML IJ SOLN
0.5000 mg | INTRAMUSCULAR | Status: AC | PRN
  Administered 2023-05-12 (×4): 0.5 mg via INTRAVENOUS

## 2023-05-12 MED ORDER — BUPIVACAINE-EPINEPHRINE (PF) 0.5% -1:200000 IJ SOLN
INTRAMUSCULAR | Status: AC
  Filled 2023-05-12: qty 30

## 2023-05-12 MED ORDER — SODIUM CHLORIDE (PF) 0.9 % IJ SOLN
INTRAMUSCULAR | Status: AC
  Filled 2023-05-12: qty 50

## 2023-05-12 MED ORDER — PROPOFOL 10 MG/ML IV BOLUS
INTRAVENOUS | Status: AC
  Filled 2023-05-12: qty 20

## 2023-05-12 MED ORDER — ONDANSETRON HCL 4 MG/2ML IJ SOLN
4.0000 mg | Freq: Once | INTRAMUSCULAR | Status: DC | PRN
  Administered 2023-05-12: 4 mg via INTRAVENOUS

## 2023-05-12 MED ORDER — ONDANSETRON 4 MG PO TBDP
4.0000 mg | ORAL_TABLET | Freq: Four times a day (QID) | ORAL | Status: DC | PRN
Start: 2023-05-12 — End: 2023-05-28
  Administered 2023-05-15 – 2023-05-27 (×3): 4 mg via ORAL
  Filled 2023-05-12 (×4): qty 1

## 2023-05-12 MED ORDER — FENTANYL CITRATE (PF) 100 MCG/2ML IJ SOLN
25.0000 ug | INTRAMUSCULAR | Status: AC | PRN
  Administered 2023-05-12: 25 ug via INTRAVENOUS
  Administered 2023-05-12: 50 ug via INTRAVENOUS
  Administered 2023-05-12 (×3): 25 ug via INTRAVENOUS
  Administered 2023-05-12: 50 ug via INTRAVENOUS

## 2023-05-12 MED ORDER — SODIUM CHLORIDE 0.9 % IV SOLN
INTRAVENOUS | Status: AC
  Filled 2023-05-12: qty 2

## 2023-05-12 MED ORDER — CIPROFLOXACIN IN D5W 400 MG/200ML IV SOLN
INTRAVENOUS | Status: AC
  Filled 2023-05-12: qty 200

## 2023-05-12 MED ORDER — ACETAMINOPHEN 10 MG/ML IV SOLN
INTRAVENOUS | Status: AC
Start: 2023-05-12 — End: ?
  Filled 2023-05-12: qty 100

## 2023-05-12 MED ORDER — SODIUM CHLORIDE (PF) 0.9 % IJ SOLN
INTRAMUSCULAR | Status: DC | PRN
  Administered 2023-05-12: 60 mL via SURGICAL_CAVITY

## 2023-05-12 MED ORDER — GABAPENTIN 100 MG PO CAPS
200.0000 mg | ORAL_CAPSULE | ORAL | Status: AC
  Administered 2023-05-12: 200 mg via ORAL

## 2023-05-12 MED ORDER — ALVIMOPAN 12 MG PO CAPS
12.0000 mg | ORAL_CAPSULE | ORAL | Status: AC
  Administered 2023-05-12: 12 mg via ORAL

## 2023-05-12 MED ORDER — SODIUM CHLORIDE 0.9 % IV SOLN
2.0000 g | Freq: Once | INTRAVENOUS | Status: AC
  Administered 2023-05-12: 2 g via INTRAVENOUS

## 2023-05-12 MED ORDER — BUPIVACAINE LIPOSOME 1.3 % IJ SUSP
INTRAMUSCULAR | Status: AC
  Filled 2023-05-12: qty 20

## 2023-05-12 MED ORDER — ACETAMINOPHEN 500 MG PO TABS
ORAL_TABLET | ORAL | Status: AC
  Filled 2023-05-12: qty 2

## 2023-05-12 MED ORDER — ROCURONIUM BROMIDE 10 MG/ML (PF) SYRINGE
PREFILLED_SYRINGE | INTRAVENOUS | Status: AC
  Filled 2023-05-12: qty 10

## 2023-05-12 MED ORDER — PANTOPRAZOLE SODIUM 40 MG IV SOLR
INTRAVENOUS | Status: AC
  Filled 2023-05-12: qty 10

## 2023-05-12 MED ORDER — CIPROFLOXACIN IN D5W 400 MG/200ML IV SOLN: 400.0000 mg | INTRAVENOUS | Status: DC

## 2023-05-12 MED ORDER — LIDOCAINE HCL (PF) 2 % IJ SOLN
INTRAMUSCULAR | Status: AC
  Filled 2023-05-12: qty 5

## 2023-05-12 MED ORDER — ENOXAPARIN SODIUM 30 MG/0.3ML IJ SOSY
30.0000 mg | PREFILLED_SYRINGE | INTRAMUSCULAR | Status: DC
  Administered 2023-05-13 – 2023-05-27 (×14): 30 mg via SUBCUTANEOUS
  Filled 2023-05-12 (×14): qty 0.3

## 2023-05-12 MED ORDER — HYDROMORPHONE HCL 1 MG/ML IJ SOLN
0.5000 mg | INTRAMUSCULAR | Status: DC | PRN
  Administered 2023-05-12: 0.5 mg via INTRAVENOUS

## 2023-05-12 SURGICAL SUPPLY — 66 items
CATH ROBINSON RED A/P 14FR (CATHETERS) IMPLANT
CATH ROBINSON RED A/P 16FR (CATHETERS) ×1 IMPLANT
CHLORAPREP W/TINT 26 (MISCELLANEOUS) IMPLANT
CLEANSER WND VASHE 34 (WOUND CARE) IMPLANT
DERMABOND ADVANCED .7 DNX12 (GAUZE/BANDAGES/DRESSINGS) ×1 IMPLANT
DRAPE LAPAROTOMY 100X77 ABD (DRAPES) ×1 IMPLANT
DRSG OPSITE POSTOP 4X12 (GAUZE/BANDAGES/DRESSINGS) IMPLANT
DRSG OPSITE POSTOP 4X8 (GAUZE/BANDAGES/DRESSINGS) IMPLANT
DRSG TEGADERM 4X10 (GAUZE/BANDAGES/DRESSINGS) IMPLANT
DRSG TEGADERM 4X4.75 (GAUZE/BANDAGES/DRESSINGS) ×1 IMPLANT
ELECT CAUTERY BLADE TIP 2.5 (TIP) ×1 IMPLANT
ELECT EZSTD 165MM 6.5IN (MISCELLANEOUS) ×1 IMPLANT
ELECT REM PT RETURN 9FT ADLT (ELECTROSURGICAL) ×1 IMPLANT
ELECTRODE CAUTERY BLDE TIP 2.5 (TIP) ×1 IMPLANT
ELECTRODE EZSTD 165MM 6.5IN (MISCELLANEOUS) ×1 IMPLANT
ELECTRODE REM PT RTRN 9FT ADLT (ELECTROSURGICAL) ×1 IMPLANT
G-TUBE MIC 18FR ENFIT ADLT (TUBING) IMPLANT
G-TUBE MIC ADLT 16FR ENFIT (TUBING) IMPLANT
GAUZE 4X4 16PLY ~~LOC~~+RFID DBL (SPONGE) IMPLANT
GAUZE SPONGE 4X4 12PLY STRL (GAUZE/BANDAGES/DRESSINGS) ×1 IMPLANT
GLOVE SURG SYN 7.0 (GLOVE) ×2 IMPLANT
GLOVE SURG SYN 7.0 PF PI (GLOVE) ×2 IMPLANT
GLOVE SURG SYN 7.5 E (GLOVE) ×2 IMPLANT
GLOVE SURG SYN 7.5 PF PI (GLOVE) ×2 IMPLANT
GOWN STRL REUS W/ TWL LRG LVL3 (GOWN DISPOSABLE) ×4 IMPLANT
KIT TURNOVER KIT A (KITS) ×1 IMPLANT
LABEL OR SOLS (LABEL) ×1 IMPLANT
LIGASURE IMPACT 36 18CM CVD LR (INSTRUMENTS) IMPLANT
MANIFOLD NEPTUNE II (INSTRUMENTS) ×1 IMPLANT
NDL HYPO 22X1.5 SAFETY MO (MISCELLANEOUS) ×1 IMPLANT
NEEDLE HYPO 22X1.5 SAFETY MO (MISCELLANEOUS) ×1 IMPLANT
NS IRRIG 1000ML POUR BTL (IV SOLUTION) ×1 IMPLANT
PACK BASIN MAJOR ARMC (MISCELLANEOUS) ×1 IMPLANT
PACK COLON CLEAN CLOSURE (MISCELLANEOUS) ×1 IMPLANT
RELOAD PROXIMATE 75MM BLUE (ENDOMECHANICALS) ×4 IMPLANT
RELOAD PROXIMATE TA60MM BLUE (ENDOMECHANICALS) ×2 IMPLANT
RELOAD STAPLE 60 BLU REG PROX (ENDOMECHANICALS) IMPLANT
RELOAD STAPLE 75 3.8 BLU REG (ENDOMECHANICALS) IMPLANT
SEPRAFILM MEMBRANE 5X6 (MISCELLANEOUS) IMPLANT
SPONGE T-LAP 18X18 ~~LOC~~+RFID (SPONGE) ×2 IMPLANT
STAPLER PROXIMATE 75MM BLUE (STAPLE) IMPLANT
STAPLER RELOAD LINE PROX 60 GR (STAPLE) ×1 IMPLANT
STAPLER RELOADABLE 60 GRN THCK (STAPLE) IMPLANT
STAPLER SKIN PROX 35W (STAPLE) ×1 IMPLANT
SUT ETHILON 2 0 FS 18 (SUTURE) ×1 IMPLANT
SUT MNCRL 4-0 27 PS-2 XMFL (SUTURE) ×1 IMPLANT
SUT PDS AB 1 CT1 36 (SUTURE) ×1 IMPLANT
SUT PDSII 8 18 CT-1 CR8 (SUTURE) ×2 IMPLANT
SUT PROLENE 2 0 SH DA (SUTURE) IMPLANT
SUT SILK 2 0 SH CR/8 (SUTURE) ×1 IMPLANT
SUT SILK 2-0 18XBRD TIE 12 (SUTURE) ×1 IMPLANT
SUT SILK 3 0 SH CR/8 (SUTURE) ×1 IMPLANT
SUT STRATA 2-0 23CM CT-2 (SUTURE) IMPLANT
SUT VIC AB 3-0 SH 27X BRD (SUTURE) ×1 IMPLANT
SUTURE MNCRL 4-0 27XMF (SUTURE) ×1 IMPLANT
SYR 10ML LL (SYRINGE) ×1 IMPLANT
SYR 20ML LL LF (SYRINGE) ×1 IMPLANT
SYR 30ML LL (SYRINGE) ×1 IMPLANT
TRAP FLUID SMOKE EVACUATOR (MISCELLANEOUS) ×1 IMPLANT
TRAY FOLEY MTR SLVR 16FR STAT (SET/KITS/TRAYS/PACK) ×1 IMPLANT
TUBE GASTRO 18FR ENFIT (TUBING) IMPLANT
TUBE GSTRM 1 16FR INTNL ENFIT (TUBING) IMPLANT
TUBE GSTRM 10.5X14FR YPRT (TUBING) IMPLANT
WATER STERILE IRR 1000ML POUR (IV SOLUTION) ×1 IMPLANT
WATER STERILE IRR 500ML POUR (IV SOLUTION) ×1 IMPLANT
YANKAUER SUCT BULB TIP NO VENT (SUCTIONS) IMPLANT

## 2023-05-12 NOTE — Interval H&P Note (Signed)
 History and Physical Interval Note:  05/12/2023 8:46 AM  Tanya Nguyen  has presented today for surgery, with the diagnosis of malfunctioning gastrojejunostomy nausea and vomiting.  The various methods of treatment have been discussed with the patient and family. After consideration of risks, benefits and other options for treatment, the patient has consented to  Procedure(s): LYSIS OF ADHESION, open, Lynden Oxford, PA-C to assist (N/A) GASTROJEJUNOSTOMY, revision, Lynden Oxford, PA-C to assist, possible feeding jejunostomy (N/A) as a surgical intervention.  The patient's history has been reviewed, patient examined, no change in status, stable for surgery.  I have reviewed the patient's chart and labs.  Questions were answered to the patient's satisfaction.     Tanya Nguyen

## 2023-05-12 NOTE — Op Note (Addendum)
 Procedure Date:  05/12/2023  Pre-operative Diagnosis:  Gastrojejunostomy dysfunction  Post-operative Diagnosis: Gastrojejunostomy dysfunction  Procedure:  Exploratory Laparotomy, lysis of adhesions, resection of previous gastrojejunostomy and jejunojejunostomy, creation of new Roux-en-Y gastrojejunostomy, new feeding jejunostomy.  Surgeon:  Howie Ill, MD  Assistant:  Lynden Oxford, PA-C  Anesthesia:  General endotracheal  Estimated Blood Loss:  15 ml  Specimens:  gastrojejunostomy with jejunojejunostomy  Complications:  None  Indications for Procedure:  This is a 58 y.o. female who presents with abdominal pain, nausea, vomiting, malnutrition, with a very dilated small bowel at the level of prior gastrojejunostomy and jejunojejunostomy.  She presents for lysis of adhesions, possible revision vs excision, and feeding jejunostomyThe risks of bleeding, abscess or infection, injury to surrounding structures, and need for further procedures were all discussed with the patient and was willing to proceed.  Description of Procedure: The patient was correctly identified in the preoperative area and brought into the operating room.  The patient was placed supine with VTE prophylaxis in place.  Appropriate time-outs were performed.  Anesthesia was induced and the patient was intubated.  Foley catheter was placed.  Appropriate antibiotics were infused.  The abdomen was prepped and draped in a sterile fashion.  A midline incision was made and electrocautery was used to dissect down the subcutaneous tissue to the fascia.  The fascia was incised and extended superiorly and inferiorly.  Prior mesh from hernia repair was encountered and also incised.  Balfour retractor was inserted.  NG tube location was confirmed to be in the stomach.  The patient had adhesions of the jejunojejunostomy to the mesentery of the transverse colon and the colon itself.  These were taken down carefully to free up the  small bowel going through the mesenteric defect from the prior surgery.  Once this was mobilized, we started dissection along the stomach.  We were able to enter the lesser sac by diving the gastrocolic ligament using LigaSure.  This exposed the gastrojejunostomy.  Further adhesions were lysed with cautery and bluntly, and allowed for full mobilization of the gastrojejunostomy.  Once dissection was completed, blue load GIA stapler was used to transect across the stomach just proximal to the gastrojejunostomy, fully excising this anastomosis.  Then, another blue load was used to transect the proximal jejunum proximal to the jejunojejunostomy and also jejunum distal to the jejunojejunostomy.  LigaSure was used to take down the mesentery of this small bowel resection.  This completed our resection.  The mesenteric defect of the transverse colon was reapproximated using 3-0 Silk suture.  Then, the distal jejunum staple line was brought antecolic to the body of the stomach.  Enterotomies were created and another blue load was used to create a new gastrojejunostomy.  TA stapler was used to close the common channel.  3-0 Silks were used to imbricate the staple line.  Then, the proximal jejunum staple line was lined with the mid jejunum and a new jejunojejunostomy was created in side to side fashion using GIA blue load.  The common channel was closed using a 2-0 Stratafix suture.  This then completed our Roux-en-Y gastrojejunostomy.  Following this, we tested the anastomosis by flushing her NG tube with 300 ml of water.  No leaks were noted.  Then we proceeded with the feeding jejunostomy.  A site for the feeding tube distal to the jejunojejunostomy was determined and a 2-0 Silk purse string suture placed.  Enterotomy was created and a 14 Fr. Red rubber catheter was inserted through the  skin and abdominal wall into the small bowel.  The purse string suture was tied, and then we proceeded to create a serosal tunnel around  the tube in witzel fashion.  The bowel was then tacked to the anterior abdominal wall to prevent any volvulus.    The abdomen was thoroughly irrigated with normal saline and Vashe solution.  We then proceeded with our clean closure and new drapes were placed.  Exparel solution mixed with 0.5% bupivacaine with epi was infiltrated over the peritoneum, fascia, and subcutaneous tissue.  The fascia was then closed using #1 PDS sutures.  The midline wound was irrigated and closed using 3-0 Vicryl and skin staples.  The feeding tube was secured using 2-0 nylon suture.  The wound was dressed with Honeycomb dressing and the feeding tube with 4x4 gauze and tegaderm.   The patient was emerged from anesthesia and extubated and brought to the recovery room for further management.  The patient tolerated the procedure well and all counts were correct at the end of the case.  Please note that Mr. Manus Rudd was scrubbed in for the critical portions of the surgery.  He assisted with lysis of adhesions, bowel resection, all anastomosis, and abdominal closure.   Howie Ill, MD

## 2023-05-12 NOTE — Anesthesia Postprocedure Evaluation (Signed)
 Anesthesia Post Note  Patient: Sashia Campas  Procedure(s) Performed: LYSIS OF ADHESION, open, Lynden Oxford, PA-C to assist GASTROJEJUNOSTOMY, revision, Lynden Oxford, PA-C to assist, possible feeding jejunostomy  Patient location during evaluation: PACU Anesthesia Type: General Level of consciousness: awake and alert Pain management: pain level controlled Vital Signs Assessment: post-procedure vital signs reviewed and stable Respiratory status: spontaneous breathing, nonlabored ventilation, respiratory function stable and patient connected to nasal cannula oxygen Cardiovascular status: blood pressure returned to baseline and stable Postop Assessment: no apparent nausea or vomiting Anesthetic complications: no   No notable events documented.   Last Vitals:  Vitals:   05/12/23 1445 05/12/23 1500  BP: (!) 145/74 135/74  Pulse: 99 98  Resp: 19 10  Temp:    SpO2: 99% 100%    Last Pain:  Vitals:   05/12/23 1500  TempSrc:   PainSc: 10-Worst pain ever                 Corinda Gubler

## 2023-05-12 NOTE — Anesthesia Procedure Notes (Signed)
 Procedure Name: Intubation Date/Time: 05/12/2023 9:24 AM  Performed by: Morene Crocker, CRNAPre-anesthesia Checklist: Patient identified, Patient being monitored, Timeout performed, Emergency Drugs available and Suction available Patient Re-evaluated:Patient Re-evaluated prior to induction Oxygen Delivery Method: Circle system utilized Preoxygenation: Pre-oxygenation with 100% oxygen Induction Type: IV induction and Rapid sequence Laryngoscope Size: 3 and McGrath Grade View: Grade I Tube type: Oral Tube size: 6.0 mm Number of attempts: 1 Airway Equipment and Method: Stylet Placement Confirmation: ETT inserted through vocal cords under direct vision, positive ETCO2 and breath sounds checked- equal and bilateral Secured at: 20 cm Tube secured with: Tape Dental Injury: Teeth and Oropharynx as per pre-operative assessment  Comments: Smooth atraumatic intubation, no complications noted.

## 2023-05-12 NOTE — Plan of Care (Signed)
  Problem: Clinical Measurements: Goal: Respiratory complications will improve Outcome: Progressing Goal: Cardiovascular complication will be avoided Outcome: Progressing   Problem: Nutrition: Goal: Adequate nutrition will be maintained Outcome: Progressing   Problem: Elimination: Goal: Will not experience complications related to urinary retention Outcome: Progressing   Problem: Safety: Goal: Ability to remain free from injury will improve Outcome: Progressing   Problem: Skin Integrity: Goal: Risk for impaired skin integrity will decrease Outcome: Progressing

## 2023-05-12 NOTE — Anesthesia Preprocedure Evaluation (Signed)
 Anesthesia Evaluation  Patient identified by MRN, date of birth, ID band Patient awake  General Assessment Comment:Cachectic, frail appearing woman  Reviewed: Allergy & Precautions, NPO status , Patient's Chart, lab work & pertinent test results  History of Anesthesia Complications Negative for: history of anesthetic complications  Airway Mallampati: II  TM Distance: >3 FB Neck ROM: Full    Dental no notable dental hx. (+) Teeth Intact   Pulmonary neg sleep apnea, neg COPD, Patient abstained from smoking.Not current smoker   Pulmonary exam normal breath sounds clear to auscultation       Cardiovascular Exercise Tolerance: Good METS(-) hypertension(-) CAD and (-) Past MI negative cardio ROS (-) dysrhythmias  Rhythm:Regular Rate:Normal - Systolic murmurs    Neuro/Psych  Headaches PSYCHIATRIC DISORDERS Anxiety Depression Bipolar Disorder    Neuromuscular disease    GI/Hepatic PUD,GERD  ,,(+)     (-) substance abuse  Chronic GI issues, nausea and vomiting,  inability to keep food down.   Endo/Other  neg diabetesHypothyroidism    Renal/GU negative Renal ROS     Musculoskeletal   Abdominal   Peds  Hematology   Anesthesia Other Findings Past Medical History: No date: Acquired pyloric stricture No date: Bipolar disorder (HCC) No date: Bleeding duodenal ulcer 02/12/2020: CAP (community acquired pneumonia) 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 No date: Chronic low back pain No date: Chronic pain syndrome 04/02/2014: Constipation No date: Depression No date: Duodenal obstruction 01/19/2018: Gastric outlet obstruction No date: GERD (gastroesophageal reflux disease) 09/22/2018: Hair loss 12/24/2022: Head injury No date: History of stomach ulcers No date: Hypothyroidism No date: IDA (iron deficiency anemia) No date: Incisional hernia, without obstruction or gangrene No date: Intractable vomiting No date: Kidney  stones 05/08/2021: Melena No date: Migraine No date: Neuropathy 09/17/2021: Nodule of middle lobe of right lung 09/16/2016: NSAID long-term use No date: Pre-diabetes No date: Protein-calorie malnutrition, severe (HCC) No date: PTSD (post-traumatic stress disorder) 09/17/2021: Right-sided chest pain 08/14/2018: Sacroiliac joint pain 03/30/2018: Side pain No date: Small bowel anastomotic dilation No date: Thrombocytosis 05/18/2021: Upper GI bleed No date: Vitamin B12 deficiency No date: Vitamin D deficiency  Reproductive/Obstetrics                             Anesthesia Physical Anesthesia Plan  ASA: 3  Anesthesia Plan: General   Post-op Pain Management: Ofirmev IV (intra-op)* and Gabapentin PO (pre-op)*   Induction: Intravenous and Rapid sequence  PONV Risk Score and Plan: 4 or greater and Ondansetron, Dexamethasone, Treatment may vary due to age or medical condition and Midazolam  Airway Management Planned: Oral ETT and Video Laryngoscope Planned  Additional Equipment: None  Intra-op Plan:   Post-operative Plan: Extubation in OR  Informed Consent: I have reviewed the patients History and Physical, chart, labs and discussed the procedure including the risks, benefits and alternatives for the proposed anesthesia with the patient or authorized representative who has indicated his/her understanding and acceptance.     Dental advisory given  Plan Discussed with: CRNA and Surgeon  Anesthesia Plan Comments: (Discussed risks of anesthesia with patient, including PONV, sore throat, lip/dental/eye damage. Rare risks discussed as well, such as cardiorespiratory and neurological sequelae, and allergic reactions. Discussed the role of CRNA in patient's perioperative care. Patient understands. Patient has listed allergy to PCN - long time ago, doesn't remember reaction Severe blistering skin reaction (SJS/TEN)? no Liver or kidney injury caused by PCN?  no Hemolytic anemia from PCN? no Drug fever?  no Painful swollen joints? no Severe reaction involving inside of mouth, eye, or genital ulcers? No  Based on current evidence Scarlett Presto al, J Allergy Clin Immunol Pract, 2019), will proceed with cephalosporin use: Yes  )       Anesthesia Quick Evaluation

## 2023-05-12 NOTE — Transfer of Care (Signed)
 Immediate Anesthesia Transfer of Care Note  Patient: Tanya Nguyen  Procedure(s) Performed: LYSIS OF ADHESION, open, Lynden Oxford, PA-C to assist GASTROJEJUNOSTOMY, revision, Lynden Oxford, PA-C to assist, possible feeding jejunostomy  Patient Location: PACU  Anesthesia Type:General  Level of Consciousness: awake, alert , and drowsy  Airway & Oxygen Therapy: Patient Spontanous Breathing and Patient connected to nasal cannula oxygen  Post-op Assessment: Report given to RN and Post -op Vital signs reviewed and stable  Post vital signs: Reviewed and stable  Last Vitals:  Vitals Value Taken Time  BP 136/48 05/12/23 1348  Temp 35.9 1348  Pulse 92 05/12/23 1352  Resp 25 05/12/23 1352  SpO2 100 % 05/12/23 1352  Vitals shown include unfiled device data.  Last Pain:  Vitals:   05/12/23 0828  TempSrc:   PainSc: 0-No pain         Complications: No notable events documented.

## 2023-05-12 NOTE — Progress Notes (Addendum)
 Initial Nutrition Assessment  DOCUMENTATION CODES:   Underweight  INTERVENTION:   Tube feeds to start 3/7:  Osmolite 1.2 @50ml /hr- Initiate at 76ml/hr and increase by 38ml/hr q 12 hours until goal rate is reached.   Free water flushes 30ml q4 hours to maintain tube patency   Regimen provides 1440kcal/day, 67g/day protein and 1184ml/day of free water.   Recommend thiamine 100mg  IV daily x 5 days  Pt at high refeed risk; recommend monitor potassium, magnesium and phosphorus labs daily until stable  Daily weights   Check vitamins A, E, D, B1, folate, B12, zinc and copper  NUTRITION DIAGNOSIS:   Inadequate oral intake related to altered GI function as evidenced by NPO status.  GOAL:   Patient will meet greater than or equal to 90% of their needs  MONITOR:   Diet advancement, Labs, Weight trends, TF tolerance, Skin, I & O's  REASON FOR ASSESSMENT:   Consult Enteral/tube feeding initiation and management  ASSESSMENT:   58 y/o female with h/o chronic pain, GERD, open cholecystectomy, IBS-C, thyroid disease, depression, anxiety, PTSD, bipolar disorder, SI, peripheral neuropathy, IDA, PUD, kindney stones and GOO s/p exploratory laparotomy (with gastrojejunostomy with omega loop/jejunojejunostomy 01/2018) complicated by incisional hernia s/p open incisional hernia repair with mesh underlay 11/2018 and who is now admitted with malfunctioning gastrojejunostomy s/p gastrojejunostomy revision, LOA and jejunostomy tube (28F) placement 3/6.  RD unable to see patient today as pt in PACU at time of RD visit. Pt is familiar to this RD from a previous admission where patient required TPN. Pt s/p J-tube placement today. Will plan to initiate tube feeds tomorrow. Pt is likely at high refeed risk. Pt with chronic malnutrition. Per chart, pt is down ~29lbs(27%) over the past 6 months; this is severe weight loss. RD will check vitamin labs as pt is at high risk for malabsorption and nutrient  deficiencies. RD will follow up to obtain history and exam once patient admitted onto the medical floors.   Medications reviewed and include: metronidazole  Labs reviewed: Na 134(L), K 3.8 wnl, Mg 1.8 wnl  NUTRITION - FOCUSED PHYSICAL EXAM: Unable to perform at this time   Diet Order:   Diet Order     None      EDUCATION NEEDS:   Not appropriate for education at this time  Skin:  Skin Assessment: Reviewed RN Assessment (incision abdomen)  Last BM:  pta  Height:   Ht Readings from Last 1 Encounters:  05/04/23 4\' 11"  (1.499 m)    Weight:   Wt Readings from Last 1 Encounters:  05/04/23 35.4 kg    Ideal Body Weight:  44.5 kg  Estimated Nutritional Needs:   Kcal:  1200-1400kcal/day  Protein:  60-70g/day  Fluid:  1.1-1.3L/day  Betsey Holiday MS, RD, LDN If unable to be reached, please send secure chat to "RD inpatient" available from 8:00a-4:00p daily

## 2023-05-12 NOTE — Brief Op Note (Signed)
 05/12/2023  1:55 PM  PATIENT:  Carisha Kantor  58 y.o. female  PRE-OPERATIVE DIAGNOSIS:  malfunctioning gastrojejunostomy nausea and vomiting  POST-OPERATIVE DIAGNOSIS:  malfunctioning gastrojejunostomynausea and vomiting  PROCEDURE:  Procedure(s): LYSIS OF ADHESION, open, Lynden Oxford, PA-C to assist (N/A) GASTROJEJUNOSTOMY, revision, Lynden Oxford, PA-C to assist, possible feeding jejunostomy (N/A)  SURGEON:  Surgeons and Role:    * Henrene Dodge, MD - Primary  PHYSICIAN ASSISTANT:  Lynden Oxford, PA-C   ANESTHESIA:   general  EBL:  15 mL   BLOOD ADMINISTERED:none  DRAINS: Jejunostomy Tube   LOCAL MEDICATIONS USED:  BUPIVICAINE   SPECIMEN:  Source of Specimen:  gastrojejunostomy  DISPOSITION OF SPECIMEN:  PATHOLOGY  COUNTS:  YES  DICTATION: .Dragon Dictation  PLAN OF CARE: Admit to inpatient   PATIENT DISPOSITION:  PACU - hemodynamically stable.   Delay start of Pharmacological VTE agent (>24hrs) due to surgical blood loss or risk of bleeding: yes

## 2023-05-13 ENCOUNTER — Encounter: Payer: Self-pay | Admitting: Surgery

## 2023-05-13 LAB — BASIC METABOLIC PANEL
Anion gap: 9 (ref 5–15)
Anion gap: 9 (ref 5–15)
BUN: 14 mg/dL (ref 6–20)
BUN: 16 mg/dL (ref 6–20)
CO2: 19 mmol/L — ABNORMAL LOW (ref 22–32)
CO2: 22 mmol/L (ref 22–32)
Calcium: 5.2 mg/dL — CL (ref 8.9–10.3)
Calcium: 7.1 mg/dL — ABNORMAL LOW (ref 8.9–10.3)
Chloride: 113 mmol/L — ABNORMAL HIGH (ref 98–111)
Chloride: 107 mmol/L (ref 98–111)
Creatinine, Ser: 0.77 mg/dL (ref 0.44–1.00)
Creatinine, Ser: 1.11 mg/dL — ABNORMAL HIGH (ref 0.44–1.00)
GFR, Estimated: >60 mL/min (ref >60)
GFR, Estimated: 58 mL/min — ABNORMAL LOW (ref >60)
Glucose, Bld: 118 mg/dL — ABNORMAL HIGH (ref 70–99)
Glucose, Bld: 114 mg/dL — ABNORMAL HIGH (ref 70–99)
Potassium: 2.6 mmol/L — CL (ref 3.5–5.1)
Potassium: 4.3 mmol/L (ref 3.5–5.1)
Sodium: 141 mmol/L (ref 135–145)
Sodium: 138 mmol/L (ref 135–145)

## 2023-05-13 LAB — CBC
HCT: 32 % — ABNORMAL LOW (ref 36.0–46.0)
Hemoglobin: 11 g/dL — ABNORMAL LOW (ref 12.0–15.0)
MCH: 30.8 pg (ref 26.0–34.0)
MCHC: 34.4 g/dL (ref 30.0–36.0)
MCV: 89.6 fL (ref 80.0–100.0)
Platelets: 205 10*3/uL (ref 150–400)
RBC: 3.57 MIL/uL — ABNORMAL LOW (ref 3.87–5.11)
RDW: 17.2 % — ABNORMAL HIGH (ref 11.5–15.5)
WBC: 11 10*3/uL — ABNORMAL HIGH (ref 4.0–10.5)
nRBC: 0 % (ref 0.0–0.2)

## 2023-05-13 LAB — HIV ANTIBODY (ROUTINE TESTING W REFLEX): HIV Screen 4th Generation wRfx: NONREACTIVE

## 2023-05-13 LAB — FOLATE: Folate: 11.3 ng/mL (ref >5.9)

## 2023-05-13 LAB — MAGNESIUM
Magnesium: 1 mg/dL — ABNORMAL LOW (ref 1.7–2.4)
Magnesium: 2.1 mg/dL (ref 1.7–2.4)

## 2023-05-13 LAB — VITAMIN B12: Vitamin B-12: 3517 pg/mL — ABNORMAL HIGH (ref 180–914)

## 2023-05-13 LAB — PHOSPHORUS: Phosphorus: 2.8 mg/dL (ref 2.5–4.6)

## 2023-05-13 LAB — VITAMIN D 25 HYDROXY (VIT D DEFICIENCY, FRACTURES): Vit D, 25-Hydroxy: 71.09 ng/mL (ref 30–100)

## 2023-05-13 MED ORDER — SODIUM CHLORIDE 0.9 % IV SOLN
INTRAVENOUS | Status: AC
  Filled 2023-05-13: qty 2

## 2023-05-13 MED ORDER — ONDANSETRON HCL 4 MG/2ML IJ SOLN
INTRAMUSCULAR | Status: AC
  Filled 2023-05-13: qty 2

## 2023-05-13 MED ORDER — HYDROMORPHONE HCL 1 MG/ML IJ SOLN
INTRAMUSCULAR | Status: AC
  Filled 2023-05-13: qty 0.5

## 2023-05-13 MED ORDER — MAGNESIUM SULFATE 2 GM/50ML IV SOLN
2.0000 g | Freq: Once | INTRAVENOUS | Status: AC
  Administered 2023-05-13: 2 g via INTRAVENOUS
  Filled 2023-05-13: qty 50

## 2023-05-13 MED ORDER — THIAMINE HCL 100 MG/ML IJ SOLN
100.0000 mg | Freq: Every day | INTRAMUSCULAR | Status: DC
  Administered 2023-05-13 – 2023-05-18 (×6): 100 mg via INTRAVENOUS
  Filled 2023-05-13 (×6): qty 2

## 2023-05-13 MED ORDER — CALCIUM GLUCONATE-NACL 1-0.675 GM/50ML-% IV SOLN
1.0000 g | Freq: Once | INTRAVENOUS | Status: AC
  Administered 2023-05-13: 1000 mg via INTRAVENOUS
  Filled 2023-05-13: qty 50

## 2023-05-13 MED ORDER — HYDROMORPHONE HCL 1 MG/ML IJ SOLN
0.5000 mg | INTRAMUSCULAR | Status: DC | PRN
  Administered 2023-05-13 – 2023-05-22 (×60): 0.5 mg via INTRAVENOUS
  Filled 2023-05-13 (×58): qty 0.5

## 2023-05-13 MED ORDER — MELATONIN 5 MG PO TABS
2.5000 mg | ORAL_TABLET | Freq: Every day | ORAL | Status: DC
  Administered 2023-05-13 – 2023-05-14 (×2): 2.5 mg via ORAL
  Filled 2023-05-13 (×2): qty 1

## 2023-05-13 MED ORDER — POTASSIUM CHLORIDE 10 MEQ/100ML IV SOLN
10.0000 meq | INTRAVENOUS | Status: AC
  Administered 2023-05-13 (×4): 10 meq via INTRAVENOUS
  Filled 2023-05-13 (×4): qty 100

## 2023-05-13 MED ORDER — SODIUM CHLORIDE 0.9 % IV SOLN: INTRAVENOUS | Status: AC

## 2023-05-13 NOTE — Progress Notes (Signed)
 Nutrition Follow Up Note   DOCUMENTATION CODES:   Severe malnutrition in context of chronic illness  INTERVENTION:   Osmolite 1.2 @50ml /hr- Initiate at 92ml/hr and increase by 29ml/hr q 12 hours until goal rate is reached.   Free water flushes 30ml q4 hours to maintain tube patency   Regimen provides 1440kcal/day, 67g/day protein and 1138ml/day of free water.   Recommend thiamine 100mg  IV daily   Pt remains at high refeed risk; recommend monitor potassium, magnesium and phosphorus labs daily until stable  Daily weights   Vitamins A, E, B1, zinc and copper pending  NUTRITION DIAGNOSIS:   Severe Malnutrition related to chronic illness as evidenced by moderate fat depletion, severe muscle depletion, 27 percent weight loss in 6 months. -new diagnosis   GOAL:   Patient will meet greater than or equal to 90% of their needs -progressing   MONITOR:   Diet advancement, Labs, Weight trends, TF tolerance, I & O's, Skin  ASSESSMENT:   58 y/o female with h/o chronic pain, GERD, open cholecystectomy, IBS-C, thyroid disease, depression, anxiety, PTSD, bipolar disorder, SI, peripheral neuropathy, IDA, PUD, kindney stones and GOO s/p exploratory laparotomy (with gastrojejunostomy with omega loop/jejunojejunostomy 01/2018) complicated by incisional hernia s/p open incisional hernia repair with mesh underlay 11/2018 and who is now admitted with malfunctioning gastrojejunostomy s/p exploratory laparotomy with lysis of adhesions, resection of previous gastrojejunostomy and jejunojejunostomy, creation of new Roux-en-Y gastrojejunostomy and new feeding jejunostomy tube (35F) 3/6.  Met with pt in room today. Pt reports abdominal pain that she relates to soreness from her surgery. No BM noted yet. NGT in place with output.    Pt reports ongoing poor appetite and oral intake for several months pta. Pt also reports ongoing weight loss. Per chart, pt is down 29lbs(27%) over the past 6 months; this  is severe weight loss. Pt reports food getting stuck in her esophagus and reports "choking" on pills and even liquids occasionally; this has gotten worse over the past month. Pt reports that she has mainly been eating soups and drinking Boost pta but reports that if the soup is chunky or has noodles it will come back up. Pt also reports ongoing progressive weakness.   Plan is for tube feed initiation today. Pt is at high refeed risk; will initiate at trickle rate and advance slowly. Pt with numerous electrolyte abnormalities; pharmacy following. Would recommend for patient to discharge home on tube feeds to support post op healing and to help pt regain some weight; this was discussed with MD who is in agreement with this plan. Discussed with patient what she can expect with home tube feeds; pt is agreeable.  Pt at high risk for malabsorption. Vitamins labs pending and will be supplemented as needed; this was also discussed with patient.   Medications reviewed and include: lovenox, synthroid, remeron, protonix, thiamine, cefetan, KCl, hydromorphone, zofran   Labs reviewed: Na 141 wnl, K 2.6(L), Ca 5.2(L), P 2.8 wnl, Mg 1.0(L) Folate 11.3 wnl, vitamin D 71.09 wnl, vitamin A (pend), vitamin E (pend), B1 (pend), B12 3,517(H), zinc (pend), copper (pend)- 3/6 Wbc- 11.0(H)  NUTRITION - FOCUSED PHYSICAL EXAM:  Flowsheet Row Most Recent Value  Orbital Region Mild depletion  Upper Arm Region Moderate depletion  Thoracic and Lumbar Region Moderate depletion  Buccal Region Mild depletion  Temple Region Mild depletion  Clavicle Bone Region Severe depletion  Clavicle and Acromion Bone Region Severe depletion  Scapular Bone Region Moderate depletion  Dorsal Hand Mild depletion  Patellar Region  Severe depletion  Anterior Thigh Region Severe depletion  Posterior Calf Region Severe depletion  Edema (RD Assessment) None  Hair Reviewed  Eyes Reviewed  Mouth Reviewed  Skin Reviewed  Nails Reviewed    Diet Order:   Diet Order             Diet NPO time specified Except for: Sips with Meds  Diet effective now                  EDUCATION NEEDS:   Education needs have been addressed  Skin:  Skin Assessment: Reviewed RN Assessment (incision abdomen)  Last BM:  pta  Height:   Ht Readings from Last 1 Encounters:  05/13/23 4\' 11"  (1.499 m)    Weight:   Wt Readings from Last 1 Encounters:  05/13/23 46.2 kg    Ideal Body Weight:  44.5 kg  Estimated Nutritional Needs:   Kcal:  1200-1400kcal/day  Protein:  60-70g/day  Fluid:  1.1-1.3L/day  Betsey Holiday MS, RD, LDN If unable to be reached, please send secure chat to "RD inpatient" available from 8:00a-4:00p daily

## 2023-05-13 NOTE — Plan of Care (Signed)
 Documented

## 2023-05-13 NOTE — Progress Notes (Signed)
  [facility]  Hospital Day(s): 1.   Post op day(s): 1 Day Post-Op.   Interval History:  Patient seen and examined No acute events or new complaints overnight.  Patient reports she has been sore overnight Very thirsty No fever, chills, nausea, emesis Mild leukocytosis to 11.0K - likely reactive Hgb to 11.0 Renal function normal; sCr - 0.77; UO - 550 ccs Hypokalemia to 2.6 Hypomagnesemia to 1.0 Hypocalcemia to 5.2 NGT with 220 ccs out  Jejunostomy clamped She is NPO   Vital signs in last 24 hours: [min-max] current  Temp:  [97.3 F (36.3 C)-98.4 F (36.9 C)] 98.4 F (36.9 C) (03/07 0750) Pulse Rate:  [90-114] 99 (03/07 0750) Resp:  [10-23] 16 (03/07 0750) BP: (97-163)/(46-95) 117/70 (03/07 0750) SpO2:  [90 %-100 %] 100 % (03/07 0750) Weight:  [46.2 kg] 46.2 kg (03/07 0500)       Weight: 46.2 kg BMI (Calculated): 20.56   Intake/Output last 2 shifts:  03/06 0701 - 03/07 0700 In: 2147.1 [I.V.:1867.8; IV Piggyback:279.4] Out: 1185 [Urine:550; Emesis/NG output:220; Blood:15]   Physical Exam:  Constitutional: alert, cooperative and no distress  HEENT: NGT in place Respiratory: breathing non-labored at rest  Cardiovascular: regular rate and sinus rhythm  Gastrointestinal: soft, incisional soreness, and non-distended. No rebound/guarding. Jejunostomy tube in left abdomen; clamped Genitourinary: Foley in place Integumentary: Laparotomy is CDI with staples, no erythema   Labs:     Latest Ref Rng & Units 05/13/2023    6:13 AM 03/30/2023    3:17 AM 03/29/2023   10:51 AM  CBC  WBC 4.0 - 10.5 K/uL 11.0  9.7  6.1   Hemoglobin 12.0 - 15.0 g/dL 36.6  44.0  34.7   Hematocrit 36.0 - 46.0 % 32.0  40.1  42.3   Platelets 150 - 400 K/uL 205  348  421       Latest Ref Rng & Units 05/13/2023    6:13 AM 04/01/2023    6:32 AM 03/31/2023   10:28 AM  CMP  Glucose 70 - 99 mg/dL 425  93  72   BUN 6 - 20 mg/dL 14  12  10    Creatinine 0.44 - 1.00 mg/dL  9.56  3.87  5.64   Sodium 135 - 145 mmol/L 141  134  134   Potassium 3.5 - 5.1 mmol/L 2.6  3.8  4.4   Chloride 98 - 111 mmol/L 113  99  99   CO2 22 - 32 mmol/L 19  24  24    Calcium 8.9 - 10.3 mg/dL 5.2  7.8  7.7      Imaging studies: No new pertinent imaging studies   Assessment/Plan:  58 y.o. female with electrolyte derangements otherwise doing well 1 Day Post-Op s/p exploratory laparotomy, lysis of adhesions, resection of previous gastrojejunostomy and jejunojejunostomy, creation of new Roux-en-Y gastrojejunostomy, new feeding jejunostomy.   - She will need to be NPO throughout the weekend to allow time for new anastomoses to heal. We will plan for UGI on Monday - Will correct electrolytes; ordered and pharmacy consulted - repeat labs at 1500 - Discontinue foley catheter today - She will need to continue NGT; LIS; monitor and record output - Complete peri-operative Abx   - Monitor abdominal examination - Pain control prn; antiemetics prn   - Mobilize as feasible; engage therapies   - Transfer to floor    All of the above findings and recommendations were discussed with the patient, and the medical team, and all  of patient's questions were answered to her expressed satisfaction.  -- Lynden Oxford, PA-C [facility] 05/13/2023, 7:57 AM M-F: 7am - 4pm

## 2023-05-13 NOTE — Progress Notes (Signed)
 OT Cancellation Note  Patient Details Name: Tanya Nguyen MRN: [id number] DOB: 1965/04/17   Cancelled Treatment:    Reason Eval/Treat Not Completed: Other (comment) (pt with critical K+ and Ca this morning, per Dr Aleen Campi, labs due at 3 pm, ok for therapy to start tomorrow. OT will follow up as able at that time.) Oleta Mouse, OTD OTR/L  05/13/23, 1:59 PM

## 2023-05-13 NOTE — Progress Notes (Signed)
 PHARMACY CONSULT NOTE - ELECTROLYTES  Pharmacy Consult for Electrolyte Monitoring and Replacement   Recent Labs: Weight: 46.2 kg (101 lb 13.6 oz) Estimated Creatinine Clearance: 52.3 mL/min (by C-G formula based on SCr of 0.77 mg/dL). Potassium (mmol/L)  Date Value  05/13/2023 2.6 (LL)  04/08/2014 3.8   Magnesium (mg/dL)  Date Value  40/98/1191 1.0 (L)   Calcium (mg/dL)  Date Value  47/82/9562 5.2 (LL)   Calcium, Total (mg/dL)  Date Value  13/10/6576 9.2   Albumin (g/dL)  Date Value  46/96/2952 2.9 (L)  02/09/2019 3.6 (L)  04/08/2014 4.0   Phosphorus (mg/dL)  Date Value  84/13/2440 2.8   Sodium (mmol/L)  Date Value  05/13/2023 141  02/09/2019 138  04/08/2014 140   Corrected Ca: 6.1 mg/dL  Assessment  Tanya Nguyen is a 58 y.o. female s/p exploratory laparotomy, lysis of adhesions, resection of previous gastrojejunostomy and jejunojejunostomy, creation of new Roux-en-Y gastrojejunostomy, new feeding jejunostomy. PMH significant for GERD, hypothyroidism, depression, PTSD, Bipolar I disorder, chronic low back pain, and electrolyte imbalance. Pharmacy has been consulted to monitor and replace electrolytes.  Diet: NPO at least through the weekend MIVF: NS @ 75 mL/hr Pertinent medications: None  Goal of Therapy: Electrolytes WNL  Plan:  Provider ordered the following: Calcium 1 g IV x 1 Mag sulfate 2 g IV x 1 KCl 10 mEq IV x 4 Repeat labs ordered for today @ 1500  Thank you for allowing pharmacy to be a part of this patient's care.  Merryl Hacker, PharmD Clinical Pharmacist 05/13/2023 8:59 AM

## 2023-05-13 NOTE — Progress Notes (Signed)
 Notified Dr. Aleen Campi of critical potassium level 2.6 and critical calcium level 5.2.

## 2023-05-14 LAB — BASIC METABOLIC PANEL
Anion gap: 5 (ref 5–15)
BUN: 15 mg/dL (ref 6–20)
CO2: 24 mmol/L (ref 22–32)
Calcium: 6.9 mg/dL — ABNORMAL LOW (ref 8.9–10.3)
Chloride: 109 mmol/L (ref 98–111)
Creatinine, Ser: 0.84 mg/dL (ref 0.44–1.00)
GFR, Estimated: >60 mL/min (ref >60)
Glucose, Bld: 113 mg/dL — ABNORMAL HIGH (ref 70–99)
Potassium: 3.4 mmol/L — ABNORMAL LOW (ref 3.5–5.1)
Sodium: 138 mmol/L (ref 135–145)

## 2023-05-14 LAB — PHOSPHORUS: Phosphorus: 1.8 mg/dL — ABNORMAL LOW (ref 2.5–4.6)

## 2023-05-14 LAB — MAGNESIUM: Magnesium: 2.1 mg/dL (ref 1.7–2.4)

## 2023-05-14 MED ORDER — POTASSIUM PHOSPHATES 15 MMOLE/5ML IV SOLN
30.0000 mmol | Freq: Once | INTRAVENOUS | Status: AC
  Administered 2023-05-14: 30 mmol via INTRAVENOUS
  Filled 2023-05-14: qty 10

## 2023-05-14 MED ORDER — CALCIUM GLUCONATE-NACL 2-0.675 GM/100ML-% IV SOLN
2.0000 g | Freq: Once | INTRAVENOUS | Status: AC
  Administered 2023-05-14: 2000 mg via INTRAVENOUS
  Filled 2023-05-14: qty 100

## 2023-05-14 NOTE — Evaluation (Signed)
 Occupational Therapy Evaluation Patient Details Name: Tanya Nguyen MRN: [id number] DOB: [birth year] Today's Date: 05/14/2023   History of Present Illness   Pt admitted for gastric outlet obstruction and is POD2 exploratory laparotomy.     Clinical Impressions Patient presenting with decreased Ind in self care,balance, functional mobility/transfers, endurance, and safety awareness. Patient reports being Ind at baseline and living with spouse. PTA. Patient currently functioning at min guard for bed mobility with min cues for technique to manage abdominal pain. Pt report 8/10 abdomen pain with mobility and was given IV pain medication prior to mobilizing. Pt stands at sink for oral care with min guard and ambulates while pushing IV pole. She does fatigue quickly and remain in room during session. Patient will benefit from acute OT to increase overall independence in the areas of ADLs, functional mobility, and safety awareness in order to safely discharge.     If plan is discharge home, recommend the following:   A little help with walking and/or transfers;A little help with bathing/dressing/bathroom;Assistance with cooking/housework;Assist for transportation;Help with stairs or ramp for entrance     Functional Status Assessment   Patient has had a recent decline in their functional status and demonstrates the ability to make significant improvements in function in a reasonable and predictable amount of time.     Equipment Recommendations   BSC/3in1;Other (comment) (RW)      Precautions/Restrictions   Precautions Precautions: Fall Recall of Precautions/Restrictions: Intact     Mobility Bed Mobility Overal bed mobility: Needs Assistance Bed Mobility: Supine to Sit, Sit to Supine     Supine to sit: Contact guard Sit to supine: Contact guard assist   General bed mobility comments: very slow movement. Able to log roll with safe technique. Removed O2 for all mobility. Once  seated at EOB, upright posture and no dizziness noted    Transfers Overall transfer level: Needs assistance Equipment used: 1 person hand held assist, 2 person hand held assist Transfers: Sit to/from Stand Sit to Stand: Min assist, +2 physical assistance, +2 safety/equipment           General transfer comment: safe technique with heavy cues for sequencing. Able to hold onto IV pole with +2 for line management. Additional person for constant min assist and guidance.      Balance Overall balance assessment: Needs assistance, History of Falls Sitting-balance support: Feet supported, No upper extremity supported Sitting balance-Leahy Scale: Good     Standing balance support: Single extremity supported Standing balance-Leahy Scale: Poor                             ADL either performed or assessed with clinical judgement   ADL Overall ADL's : Needs assistance/impaired     Grooming: Oral care;Wash/dry hands;Standing;Minimal assistance                                       Vision Patient Visual Report: No change from baseline              Pertinent Vitals/Pain Pain Assessment Pain Assessment: 0-10 Pain Score: 8  Pain Location: stomach Pain Descriptors / Indicators: Operative site guarding, Discomfort Pain Intervention(s): Limited activity within patient's tolerance, Monitored during session, Repositioned     Extremity/Trunk Assessment Upper Extremity Assessment Upper Extremity Assessment: Generalized weakness   Lower Extremity Assessment Lower Extremity Assessment: Generalized weakness  Communication Communication Communication: No apparent difficulties   Cognition Arousal: Alert Behavior During Therapy: WFL for tasks assessed/performed Cognition: No apparent impairments                               Following commands: Intact       Cueing  General Comments   Cueing Techniques: Verbal cues               Home Living Family/patient expects to be discharged to:: Private residence Living Arrangements: Spouse/significant other Available Help at Discharge: Family Type of Home: House Home Access: Stairs to enter Secretary/administrator of Steps: 4 Entrance Stairs-Rails: Can reach both Home Layout: One level     Bathroom Shower/Tub: Walk-in shower         Home Equipment: Agricultural consultant (2 wheels);Grab bars - tub/shower;Shower seat          Prior Functioning/Environment Prior Level of Function : Independent/Modified Independent             Mobility Comments: previously indep with all mobility. Multiple falls with needing husband assist for recovery. ADLs Comments: hasn't worked in 1 year. Mother recently passed away a few weeks ago. Pt endorses Ind with all aspects of mobility and self care.    OT Problem List: Decreased strength;Decreased activity tolerance;Decreased safety awareness;Impaired balance (sitting and/or standing);Pain   OT Treatment/Interventions: Self-care/ADL training;Therapeutic exercise;Therapeutic activities;Energy conservation;DME and/or AE instruction;Patient/family education;Balance training      OT Goals(Current goals can be found in the care plan section)   Acute Rehab OT Goals Patient Stated Goal: to go home an decrease pain OT Goal Formulation: With patient Time For Goal Achievement: 05/28/23 Potential to Achieve Goals: Fair ADL Goals Pt Will Perform Grooming: with modified independence;standing Pt Will Perform Lower Body Dressing: with modified independence;sit to/from stand Pt Will Transfer to Toilet: with modified independence;ambulating Pt Will Perform Toileting - Clothing Manipulation and hygiene: with modified independence;sit to/from stand   OT Frequency:  Min 2X/week    Co-evaluation   Reason for Co-Treatment: Complexity of the patient's impairments (multi-system involvement);To address functional/ADL transfers PT goals addressed  during session: Mobility/safety with mobility OT goals addressed during session: ADL's and self-care      AM-PAC OT "6 Clicks" Daily Activity     Outcome Measure Help from another person eating meals?: None Help from another person taking care of personal grooming?: A Little Help from another person toileting, which includes using toliet, bedpan, or urinal?: A Little Help from another person bathing (including washing, rinsing, drying)?: A Little Help from another person to put on and taking off regular upper body clothing?: None Help from another person to put on and taking off regular lower body clothing?: A Little 6 Click Score: 20   End of Session Nurse Communication: Mobility status;Other (comment) (O2 removed)  Activity Tolerance: Patient tolerated treatment well Patient left: in bed;with call bell/phone within reach;with bed alarm set  OT Visit Diagnosis: Unsteadiness on feet (R26.81);Repeated falls (R29.6);Muscle weakness (generalized) (M62.81)                Time: 1017-1050 OT Time Calculation (min): 33 min Charges:  OT General Charges $OT Visit: 1 Visit OT Evaluation $OT Eval Moderate Complexity: 1 [address] [place], OTR/L , CBIS ascom [phone]  05/14/23, 1:18 PM

## 2023-05-14 NOTE — Evaluation (Addendum)
 Physical Therapy Evaluation Patient Details Name: Tanya Nguyen MRN: [id number] DOB: Jan 19, 1966 Today's Date: 05/14/2023  History of Present Illness  Pt admitted for gastric outlet obstruction and is POD2 exploratory laparotomy.  Clinical Impression  PT/OT co-evaluation performed. Pt is a pleasant 58 year old female who was admitted for gastric outlet obstuction. Pt performs bed mobility with cga, transfers with min assist, and ambulation with min assist +2. Pt may benefit from AD next session to increase independence. Pt demonstrates deficits with strength/mobility/pain/balance. All mobility performed on RA with no SOB symptoms. History of falls. Would benefit from skilled PT to address above deficits and promote optimal return to PLOF. Pt will continue to receive skilled PT services while admitted and will defer to TOC/care team for updates regarding disposition planning.  SaO2 on room air at rest = 98% SaO2 on room air while ambulating = 93% SaO2 on n/a liters of O2 while ambulating = n/a%          If plan is discharge home, recommend the following: A little help with walking and/or transfers;A little help with bathing/dressing/bathroom;Help with stairs or ramp for entrance;Assist for transportation   Can travel by private vehicle        Equipment Recommendations BSC/3in1;Rolling walker (2 wheels)  Recommendations for Other Services       Functional Status Assessment Patient has had a recent decline in their functional status and demonstrates the ability to make significant improvements in function in a reasonable and predictable amount of time.     Precautions / Restrictions Precautions Precautions: Fall Recall of Precautions/Restrictions: Intact Restrictions Weight Bearing Restrictions Per Provider Order: No      Mobility  Bed Mobility Overal bed mobility: Needs Assistance Bed Mobility: Supine to Sit, Sit to Supine     Supine to sit: Contact guard Sit to supine:  Contact guard assist   General bed mobility comments: very slow movement. Able to log roll with safe technique. Removed O2 for all mobility. Once seated at EOB, upright posture and no dizziness noted    Transfers Overall transfer level: Needs assistance Equipment used: 1 person hand held assist Transfers: Sit to/from Stand Sit to Stand: Min assist, +2 physical assistance, +2 safety/equipment           General transfer comment: safe technique with heavy cues for sequencing. Able to hold onto IV pole with +2 for line management. Additional person for constant min assist and guidance.    Ambulation/Gait Ambulation/Gait assistance: Min assist, +2 physical assistance Gait Distance (Feet): 20 Feet Assistive device: IV Pole Gait Pattern/deviations: Step-to pattern       General Gait Details: slow gait with min assist and cues for sequencing. Additional person for line management. May benefit from AD next session  Stairs            Wheelchair Mobility     Tilt Bed    Modified Rankin (Stroke Patients Only)       Balance Overall balance assessment: Needs assistance, History of Falls Sitting-balance support: Feet supported, No upper extremity supported Sitting balance-Leahy Scale: Good     Standing balance support: Single extremity supported Standing balance-Leahy Scale: Poor                               Pertinent Vitals/Pain Pain Assessment Pain Assessment: 0-10 Pain Score: 8  Pain Location: stomach Pain Descriptors / Indicators: Operative site guarding Pain Intervention(s): Limited activity within patient's tolerance, RN  gave pain meds during session    Home Living Family/patient expects to be discharged to:: Private residence Living Arrangements: Spouse/significant other Available Help at Discharge: Family Type of Home: House Home Access: Stairs to enter Entrance Stairs-Rails: Can reach both Entrance Stairs-Number of Steps: 4   Home  Layout: One level Home Equipment: Agricultural consultant (2 wheels);Grab bars - tub/shower;Shower seat      Prior Function Prior Level of Function : Independent/Modified Independent             Mobility Comments: previously indep with all mobility. Multiple falls with needing husband assist for recovery. ADLs Comments: hasn't worked in 1 year. Mother recently passed away a few weeks ago     Extremity/Trunk Assessment   Upper Extremity Assessment Upper Extremity Assessment: Generalized weakness (B UE grossly 3+/5)    Lower Extremity Assessment Lower Extremity Assessment: Generalized weakness (B LE grossly 3+/5)       Communication   Communication Communication: No apparent difficulties    Cognition Arousal: Alert Behavior During Therapy: WFL for tasks assessed/performed   PT - Cognitive impairments: No apparent impairments                       PT - Cognition Comments: pleasant and agreeable to session Following commands: Intact       Cueing Cueing Techniques: Verbal cues     General Comments      Exercises Other Exercises Other Exercises: Able to stand at sink for ADL with OT   Assessment/Plan    PT Assessment Patient needs continued PT services  PT Problem List Decreased strength;Decreased activity tolerance;Decreased balance;Decreased mobility;Decreased knowledge of use of DME;Pain       PT Treatment Interventions DME instruction;Gait training;Therapeutic exercise;Balance training    PT Goals (Current goals can be found in the Care Plan section)  Acute Rehab PT Goals Patient Stated Goal: to go home PT Goal Formulation: With patient Time For Goal Achievement: 05/28/23 Potential to Achieve Goals: Good    Frequency Min 3X/week     Co-evaluation PT/OT/SLP Co-Evaluation/Treatment: Yes Reason for Co-Treatment: Complexity of the patient's impairments (multi-system involvement);To address functional/ADL transfers PT goals addressed during session:  Mobility/safety with mobility OT goals addressed during session: ADL's and self-care       AM-PAC PT "6 Clicks" Mobility  Outcome Measure Help needed turning from your back to your side while in a flat bed without using bedrails?: A Little Help needed moving from lying on your back to sitting on the side of a flat bed without using bedrails?: A Little Help needed moving to and from a bed to a chair (including a wheelchair)?: A Little Help needed standing up from a chair using your arms (e.g., wheelchair or bedside chair)?: A Little Help needed to walk in hospital room?: A Lot Help needed climbing 3-5 steps with a railing? : A Lot 6 Click Score: 16    End of Session   Activity Tolerance: Patient limited by pain Patient left: in bed;with call bell/phone within reach Nurse Communication: Mobility status PT Visit Diagnosis: Unsteadiness on feet (R26.81);Muscle weakness (generalized) (M62.81);History of falling (Z91.81);Difficulty in walking, not elsewhere classified (R26.2);Pain Pain - Right/Left:  (midline) Pain - part of body:  (stomach)    Time: 1019-1050 PT Time Calculation (min) (ACUTE ONLY): 31 min   Charges:   PT Evaluation $PT Eval Low Complexity: 1 Low PT Treatments $Gait Training: 8-22 mins PT General Charges $$ ACUTE PT VISIT: 1 Visit  Elizabeth Palau, PT, DPT, GCS [phone]   Dina Warbington 05/14/2023, 11:06 AM

## 2023-05-14 NOTE — Progress Notes (Signed)
 Subjective:  CC: Tanya Nguyen is a 58 y.o. female  Hospital stay day 2, 2 Days Post-Op  s/p exploratory laparotomy, lysis of adhesions, resection of previous gastrojejunostomy and jejunojejunostomy, creation of new Roux-en-Y gastrojejunostomy, new feeding jejunostomy.   HPI: No acute issues overnight.  States last night was better compared to the night prior.  ROS:  General: Denies weight loss, weight gain, fatigue, fevers, chills, and night sweats. Heart: Denies chest pain, palpitations, racing heart, irregular heartbeat, leg pain or swelling, and decreased activity tolerance. Respiratory: Denies breathing difficulty, shortness of breath, wheezing, cough, and sputum. GI: Denies change in appetite, heartburn, nausea, vomiting, constipation, diarrhea, and blood in stool. GU: Denies difficulty urinating, pain with urinating, urgency, frequency, blood in urine.   Objective:   Temp:  [97.6 F (36.4 C)-98.4 F (36.9 C)] 98 F (36.7 C) (03/08 0316) Pulse Rate:  [95-100] 96 (03/08 0316) Resp:  [14-20] 20 (03/08 0316) BP: (117-137)/(69-90) 123/74 (03/08 0316) SpO2:  [99 %-100 %] 100 % (03/08 0316) Weight:  [40.4 kg-46.2 kg] 40.4 kg (03/08 0422)     Height: 4\' 11"  (149.9 cm) Weight: 40.4 kg BMI (Calculated): 17.98   Intake/Output this shift:   Intake/Output Summary (Last 24 hours) at 05/14/2023 0427 Last data filed at 05/13/2023 1703 Gross per 24 hour  Intake 1350.82 ml  Output 595 ml  Net 755.82 ml    Constitutional :  alert, cooperative, appears stated age, and no distress  Respiratory:  clear to auscultation bilaterally  Cardiovascular:  regular rate and rhythm  Gastrointestinal: Soft, no guarding, tenderness to palpation along incision as expected.  Feeding tube in place. .   Skin: Cool and moist.   Psychiatric: Normal affect, non-agitated, not confused       LABS:     Latest Ref Rng & Units 05/13/2023    2:44 PM 05/13/2023    6:13 AM 04/01/2023    6:32 AM  CMP  Glucose 70 - 99  mg/dL 161  096  93   BUN 6 - 20 mg/dL 16  14  12    Creatinine 0.44 - 1.00 mg/dL 0.45  4.09  8.11   Sodium 135 - 145 mmol/L 138  141  134   Potassium 3.5 - 5.1 mmol/L 4.3  2.6  3.8   Chloride 98 - 111 mmol/L 107  113  99   CO2 22 - 32 mmol/L 22  19  24    Calcium 8.9 - 10.3 mg/dL 7.1  5.2  7.8       Latest Ref Rng & Units 05/13/2023    6:13 AM 03/30/2023    3:17 AM 03/29/2023   10:51 AM  CBC  WBC 4.0 - 10.5 K/uL 11.0  9.7  6.1   Hemoglobin 12.0 - 15.0 g/dL 91.4  78.2  95.6   Hematocrit 36.0 - 46.0 % 32.0  40.1  42.3   Platelets 150 - 400 K/uL 205  348  421     RADS: N/a Assessment:    s/p exploratory laparotomy, lysis of adhesions, resection of previous gastrojejunostomy and jejunojejunostomy, creation of new Roux-en-Y gastrojejunostomy, new feeding jejunostomy.   Continue current management with NG tube decompression, electrolyte monitoring, tube feeds.  Plan for upper GI study in a couple days.  Continue antibiotics for intra-abdominal infection  labs/images/medications/previous chart entries reviewed personally and relevant changes/updates noted above.

## 2023-05-14 NOTE — Plan of Care (Signed)

## 2023-05-14 NOTE — Progress Notes (Signed)
 PHARMACY CONSULT NOTE - ELECTROLYTES  Pharmacy Consult for Electrolyte Monitoring and Replacement   Recent Labs: Height: 4\' 11"  (149.9 cm) Weight: 40.4 kg (89 lb 1.1 oz) IBW/kg (Calculated) : 43.2 Estimated Creatinine Clearance: 46.6 mL/min (by C-G formula based on SCr of 0.84 mg/dL). Potassium (mmol/L)  Date Value  05/14/2023 3.4 (L)  04/08/2014 3.8   Magnesium (mg/dL)  Date Value  16/12/9602 2.1   Calcium (mg/dL)  Date Value  54/11/8117 6.9 (L)   Calcium, Total (mg/dL)  Date Value  14/78/2956 9.2   Albumin (g/dL)  Date Value  21/30/8657 2.9 (L)  02/09/2019 3.6 (L)  04/08/2014 4.0   Phosphorus (mg/dL)  Date Value  84/69/6295 1.8 (L)   Sodium (mmol/L)  Date Value  05/14/2023 138  02/09/2019 138  04/08/2014 140   Corrected Ca: 6.1 mg/dL  Assessment  Tanya Nguyen is a 58 y.o. female s/p exploratory laparotomy, lysis of adhesions, resection of previous gastrojejunostomy and jejunojejunostomy, creation of new Roux-en-Y gastrojejunostomy, new feeding jejunostomy. PMH significant for GERD, hypothyroidism, depression, PTSD, Bipolar I disorder, chronic low back pain, and electrolyte imbalance. Pharmacy has been consulted to monitor and replace electrolytes.  Diet: NPO at least through the weekend MIVF: NS @ 75 mL/hr Pertinent medications: None  Goal of Therapy: Electrolytes WNL  Plan:  K 3.4, Phos 1.8: Kphos IV x 1 Ca 6.9: Calcium gluconate 2g IV x 1 Will recheck BMP, Mag, Phos tomorrow with morning labs  Thank you for allowing pharmacy to be a part of this patient's care.  [name], PharmD Clinical Pharmacist 05/14/2023 7:27 AM

## 2023-05-15 LAB — BASIC METABOLIC PANEL
Anion gap: 7 (ref 5–15)
BUN: 10 mg/dL (ref 6–20)
CO2: 24 mmol/L (ref 22–32)
Calcium: 7.1 mg/dL — ABNORMAL LOW (ref 8.9–10.3)
Chloride: 108 mmol/L (ref 98–111)
Creatinine, Ser: 0.6 mg/dL (ref 0.44–1.00)
GFR, Estimated: >60 mL/min (ref >60)
Glucose, Bld: 105 mg/dL — ABNORMAL HIGH (ref 70–99)
Potassium: 4 mmol/L (ref 3.5–5.1)
Sodium: 139 mmol/L (ref 135–145)

## 2023-05-15 LAB — CBC
HCT: 33.3 % — ABNORMAL LOW (ref 36.0–46.0)
Hemoglobin: 11.8 g/dL — ABNORMAL LOW (ref 12.0–15.0)
MCH: 30.8 pg (ref 26.0–34.0)
MCHC: 35.4 g/dL (ref 30.0–36.0)
MCV: 86.9 fL (ref 80.0–100.0)
Platelets: 200 10*3/uL (ref 150–400)
RBC: 3.83 MIL/uL — ABNORMAL LOW (ref 3.87–5.11)
RDW: 18.3 % — ABNORMAL HIGH (ref 11.5–15.5)
WBC: 8.7 10*3/uL (ref 4.0–10.5)
nRBC: 0 % (ref 0.0–0.2)

## 2023-05-15 LAB — MAGNESIUM: Magnesium: 1.8 mg/dL (ref 1.7–2.4)

## 2023-05-15 LAB — VITAMIN E
Vitamin E (Alpha Tocopherol): 7.5 mg/L (ref 7.0–25.1)
Vitamin E(Gamma Tocopherol): 0.4 mg/L — ABNORMAL LOW (ref 0.5–5.5)

## 2023-05-15 LAB — VITAMIN A: Vitamin A (Retinoic Acid): 13.5 ug/dL — ABNORMAL LOW (ref 20.1–62.0)

## 2023-05-15 LAB — PHOSPHORUS: Phosphorus: 2.2 mg/dL — ABNORMAL LOW (ref 2.5–4.6)

## 2023-05-15 MED ORDER — CALCIUM GLUCONATE-NACL 2-0.675 GM/100ML-% IV SOLN
2.0000 g | Freq: Once | INTRAVENOUS | Status: AC
  Administered 2023-05-15: 2000 mg via INTRAVENOUS
  Filled 2023-05-15: qty 100

## 2023-05-15 MED ORDER — K PHOS MONO-SOD PHOS DI & MONO 155-852-130 MG PO TABS
500.0000 mg | ORAL_TABLET | ORAL | Status: AC
  Administered 2023-05-15: 500 mg
  Filled 2023-05-15: qty 2

## 2023-05-15 MED ORDER — LEVOTHYROXINE SODIUM 50 MCG PO TABS
50.0000 ug | ORAL_TABLET | Freq: Every day | ORAL | Status: DC
  Administered 2023-05-16 – 2023-05-27 (×12): 50 ug
  Filled 2023-05-15 (×12): qty 1

## 2023-05-15 MED ORDER — POLYETHYLENE GLYCOL 3350 17 G PO PACK: 17.0000 g | PACK | Freq: Every day | ORAL | Status: DC | PRN

## 2023-05-15 MED ORDER — ARIPIPRAZOLE 2 MG PO TABS
2.0000 mg | ORAL_TABLET | Freq: Every day | ORAL | Status: DC
  Administered 2023-05-15 – 2023-05-26 (×12): 2 mg
  Filled 2023-05-15 (×13): qty 1

## 2023-05-15 MED ORDER — MIRTAZAPINE 15 MG PO TABS
15.0000 mg | ORAL_TABLET | Freq: Every day | ORAL | Status: DC
  Administered 2023-05-15 – 2023-05-26 (×12): 15 mg
  Filled 2023-05-15 (×12): qty 1

## 2023-05-15 MED ORDER — K PHOS MONO-SOD PHOS DI & MONO 155-852-130 MG PO TABS
500.0000 mg | ORAL_TABLET | ORAL | Status: DC
  Filled 2023-05-15 (×2): qty 2

## 2023-05-15 MED ORDER — MELATONIN 5 MG PO TABS
2.5000 mg | ORAL_TABLET | Freq: Every day | ORAL | Status: DC
  Administered 2023-05-15 – 2023-05-26 (×12): 2.5 mg
  Filled 2023-05-15 (×12): qty 1

## 2023-05-15 NOTE — Progress Notes (Signed)
 Subjective:  CC: Tanya Nguyen is a 58 y.o. female  Hospital stay day 3, 3 Days Post-Op  s/p exploratory laparotomy, lysis of adhesions, resection of previous gastrojejunostomy and jejunojejunostomy, creation of new Roux-en-Y gastrojejunostomy, new feeding jejunostomy.   HPI: NG came out accidentally overnight.  Patient reports multiple bowel movements. Pain overall improving  ROS:  General: Denies weight loss, weight gain, fatigue, fevers, chills, and night sweats. Heart: Denies chest pain, palpitations, racing heart, irregular heartbeat, leg pain or swelling, and decreased activity tolerance. Respiratory: Denies breathing difficulty, shortness of breath, wheezing, cough, and sputum. GI: Denies change in appetite, heartburn, nausea, vomiting, constipation, diarrhea, and blood in stool. GU: Denies difficulty urinating, pain with urinating, urgency, frequency, blood in urine.   Objective:   Temp:  [97.7 F (36.5 C)-98.1 F (36.7 C)] 98.1 F (36.7 C) (03/09 0812) Pulse Rate:  [107-110] 107 (03/09 0812) Resp:  [16-18] 16 (03/09 0812) BP: (128-149)/(75-92) 129/75 (03/09 0812) SpO2:  [97 %-100 %] 97 % (03/09 0812) Weight:  [40.8 kg] 40.8 kg (03/09 0408)     Height: 4\' 11"  (149.9 cm) Weight: 40.8 kg BMI (Calculated): 18.16   Intake/Output this shift:   Intake/Output Summary (Last 24 hours) at 05/15/2023 1258 Last data filed at 05/14/2023 2348 Gross per 24 hour  Intake --  Output 1 ml  Net -1 ml    Constitutional :  alert, cooperative, appears stated age, and no distress  Respiratory:  clear to auscultation bilaterally  Cardiovascular:  regular rate and rhythm  Gastrointestinal: Soft, no guarding, tenderness to palpation along incision as expected.  Feeding tube in place. .   Skin: Cool and moist.   Psychiatric: Normal affect, non-agitated, not confused       LABS:     Latest Ref Rng & Units 05/15/2023    5:50 AM 05/14/2023    4:23 AM 05/13/2023    2:44 PM  CMP  Glucose 70 - 99  mg/dL 621  308  657   BUN 6 - 20 mg/dL 10  15  16    Creatinine 0.44 - 1.00 mg/dL 8.46  9.62  9.52   Sodium 135 - 145 mmol/L 139  138  138   Potassium 3.5 - 5.1 mmol/L 4.0  3.4  4.3   Chloride 98 - 111 mmol/L 108  109  107   CO2 22 - 32 mmol/L 24  24  22    Calcium 8.9 - 10.3 mg/dL 7.1  6.9  7.1       Latest Ref Rng & Units 05/15/2023    5:50 AM 05/13/2023    6:13 AM 03/30/2023    3:17 AM  CBC  WBC 4.0 - 10.5 K/uL 8.7  11.0  9.7   Hemoglobin 12.0 - 15.0 g/dL 84.1  32.4  40.1   Hematocrit 36.0 - 46.0 % 33.3  32.0  40.1   Platelets 150 - 400 K/uL 200  205  348     RADS: N/a Assessment:    s/p exploratory laparotomy, lysis of adhesions, resection of previous gastrojejunostomy and jejunojejunostomy, creation of new Roux-en-Y gastrojejunostomy, new feeding jejunostomy.   NG can remain out since patient overall is improving with bowel movement reported.  Continue current management otherwise.  Radiology not available for upper GI study today so we will proceed with the tomorrow.  Continue antibiotics for intra-abdominal infection  labs/images/medications/previous chart entries reviewed personally and relevant changes/updates noted above.

## 2023-05-15 NOTE — Plan of Care (Signed)

## 2023-05-15 NOTE — Progress Notes (Signed)
 PHARMACY CONSULT NOTE - ELECTROLYTES  Pharmacy Consult for Electrolyte Monitoring and Replacement   Recent Labs: Height: 4\' 11"  (149.9 cm) Weight: 40.8 kg (89 lb 15.2 oz) IBW/kg (Calculated) : 43.2 Estimated Creatinine Clearance: 49.4 mL/min (by C-G formula based on SCr of 0.6 mg/dL). Potassium (mmol/L)  Date Value  05/15/2023 4.0  04/08/2014 3.8   Magnesium (mg/dL)  Date Value  16/12/9602 1.8   Calcium (mg/dL)  Date Value  54/11/8117 7.1 (L)   Calcium, Total (mg/dL)  Date Value  14/78/2956 9.2   Albumin (g/dL)  Date Value  21/30/8657 2.9 (L)  02/09/2019 3.6 (L)  04/08/2014 4.0   Phosphorus (mg/dL)  Date Value  84/69/6295 2.2 (L)   Sodium (mmol/L)  Date Value  05/15/2023 139  02/09/2019 138  04/08/2014 140   Corrected Ca: 7.98 mg/dL  Assessment  Tanya Nguyen is a 58 y.o. female s/p exploratory laparotomy, lysis of adhesions, resection of previous gastrojejunostomy and jejunojejunostomy, creation of new Roux-en-Y gastrojejunostomy, new feeding jejunostomy. PMH significant for GERD, hypothyroidism, depression, PTSD, Bipolar I disorder, chronic low back pain, and electrolyte imbalance. Pharmacy has been consulted to monitor and replace electrolytes.  Diet: NPO at least through the weekend MIVF: NS @ 50 mL/hr Pertinent medications: None  Goal of Therapy: Electrolytes WNL  Plan:  Phos 2.2: kphos 500mg  PO x 2 Ca 7.1: Calcium gluconate 2g IV x 1 Will recheck BMP, Mag, Phos tomorrow with morning labs  Thank you for allowing pharmacy to be a part of this patient's care.  [name], PharmD Clinical Pharmacist 05/15/2023 7:25 AM

## 2023-05-16 ENCOUNTER — Inpatient Hospital Stay

## 2023-05-16 ENCOUNTER — Other Ambulatory Visit: Payer: Medicare HMO

## 2023-05-16 LAB — SURGICAL PATHOLOGY

## 2023-05-16 LAB — BASIC METABOLIC PANEL
Anion gap: 6 (ref 5–15)
BUN: 6 mg/dL (ref 6–20)
CO2: 27 mmol/L (ref 22–32)
Calcium: 7.3 mg/dL — ABNORMAL LOW (ref 8.9–10.3)
Chloride: 108 mmol/L (ref 98–111)
Creatinine, Ser: 0.45 mg/dL (ref 0.44–1.00)
GFR, Estimated: >60 mL/min (ref >60)
Glucose, Bld: 103 mg/dL — ABNORMAL HIGH (ref 70–99)
Potassium: 3.8 mmol/L (ref 3.5–5.1)
Sodium: 141 mmol/L (ref 135–145)

## 2023-05-16 LAB — MAGNESIUM: Magnesium: 1.7 mg/dL (ref 1.7–2.4)

## 2023-05-16 LAB — PHOSPHORUS: Phosphorus: 2.4 mg/dL — ABNORMAL LOW (ref 2.5–4.6)

## 2023-05-16 LAB — ALBUMIN: Albumin: 1.6 g/dL — ABNORMAL LOW (ref 3.5–5.0)

## 2023-05-16 MED ORDER — IOHEXOL 300 MG/ML  SOLN
80.0000 mL | Freq: Once | INTRAMUSCULAR | Status: AC | PRN
  Administered 2023-05-16: 80 mL via INTRAVENOUS

## 2023-05-16 MED ORDER — IOHEXOL 300 MG/ML  SOLN
150.0000 mL | Freq: Once | INTRAMUSCULAR | Status: AC | PRN
Start: 2023-05-16 — End: 2023-05-16
  Administered 2023-05-16: 150 mL via ORAL

## 2023-05-16 MED ORDER — POTASSIUM & SODIUM PHOSPHATES 280-160-250 MG PO PACK
2.0000 | PACK | ORAL | Status: AC
  Administered 2023-05-16 (×3): 2
  Filled 2023-05-16 (×3): qty 2

## 2023-05-16 MED ORDER — KCL IN DEXTROSE-NACL 20-5-0.9 MEQ/L-%-% IV SOLN
INTRAVENOUS | Status: DC
  Filled 2023-05-16 (×3): qty 1000

## 2023-05-16 MED ORDER — MAGNESIUM SULFATE 2 GM/50ML IV SOLN
2.0000 g | Freq: Once | INTRAVENOUS | Status: AC
  Administered 2023-05-16: 2 g via INTRAVENOUS
  Filled 2023-05-16: qty 50

## 2023-05-16 NOTE — Progress Notes (Signed)
 Physical Therapy Treatment Patient Details Name: Tanya Nguyen MRN: [id number] DOB: 1965-04-02 Today's Date: 05/16/2023   History of Present Illness Pt admitted for gastric outlet obstruction and is POD2 exploratory laparotomy.    PT Comments  Pt seen for PT tx with pt agreeable. Pt follows simple commands throughout session, reports feeling nervous during gait & appears to have decreased processing time. Pt performs bed mobility via log rolling with supervision & hospital bed features, STS with min assist, & ambulates 2 laps around nurses station without AD with min assist with impaired gait pattern noted below. Pt performed 5x STS without BUE support with focus on BLE strengthening with pt requiring mod assist to power up to standing. Pt would benefit from ongoing PT services to progress balance, gait with LRAD, & stair negotiation.    If plan is discharge home, recommend the following: A little help with walking and/or transfers;A little help with bathing/dressing/bathroom;Assistance with cooking/housework;Assist for transportation;Help with stairs or ramp for entrance   Can travel by private vehicle        Equipment Recommendations  BSC/3in1;Rolling walker (2 wheels)    Recommendations for Other Services       Precautions / Restrictions Precautions Precautions: Fall Recall of Precautions/Restrictions: Intact Restrictions Weight Bearing Restrictions Per Provider Order: No     Mobility  Bed Mobility Overal bed mobility: Needs Assistance             General bed mobility comments: supervision for log rolling to exit L side of bed with HOB slightly elevated, use of bed rails    Transfers Overall transfer level: Needs assistance Equipment used: None Transfers: Sit to/from Stand Sit to Stand: Min assist                Ambulation/Gait Ambulation/Gait assistance: Min assist Gait Distance (Feet): 310 Feet Assistive device: None Gait Pattern/deviations: Decreased  step length - right, Decreased step length - left, Decreased stride length, Decreased dorsiflexion - right, Decreased dorsiflexion - left, Decreased weight shift to right Gait velocity: decreased     General Gait Details: Pt ambulates 2 laps around nurses station without AD with min assist with pt initially holding BUE upright by arms. PT provides encouragement/cuing for increased reciprocal arm swing with pt very slowly beginning to progress to more fluid gait pattern by end of session.   Stairs             Wheelchair Mobility     Tilt Bed    Modified Rankin (Stroke Patients Only)       Balance Overall balance assessment: Needs assistance, History of Falls Sitting-balance support: Feet supported, No upper extremity supported Sitting balance-Leahy Scale: Good       Standing balance-Leahy Scale: Poor                              Communication Communication Communication: No apparent difficulties  Cognition Arousal: Alert Behavior During Therapy: WFL for tasks assessed/performed   PT - Cognitive impairments: Sequencing, Problem solving, Safety/Judgement                       PT - Cognition Comments: decreased processing Following commands: Intact      Cueing Cueing Techniques: Verbal cues  Exercises Other Exercises Other Exercises: Pt performed 5x STS from recliner without BUE support with focus on BLE strengthening & overall endurance training; pt required mod assist to power up to standing & cuing  for sequencing, technique. Other Exercises: Pt stood at sink to perform hand hygiene with CGA for balance.    General Comments General comments (skin integrity, edema, etc.): Pt with bedpan on bed, reporting she's been using bedpan 2/2 diarrhea; PT encouraged use of BSC or going to bathroom with nursing assistance.      Pertinent Vitals/Pain Pain Assessment Pain Assessment: Faces Faces Pain Scale: Hurts little more Pain Location:  stomach Pain Descriptors / Indicators: Discomfort, Grimacing Pain Intervention(s): Monitored during session    Home Living                          Prior Function            PT Goals (current goals can now be found in the care plan section) Acute Rehab PT Goals Patient Stated Goal: to go home PT Goal Formulation: With patient Time For Goal Achievement: 05/28/23 Potential to Achieve Goals: Good Progress towards PT goals: Progressing toward goals    Frequency    Min 3X/week      PT Plan      Co-evaluation              AM-PAC PT "6 Clicks" Mobility   Outcome Measure  Help needed turning from your back to your side while in a flat bed without using bedrails?: None Help needed moving from lying on your back to sitting on the side of a flat bed without using bedrails?: A Little Help needed moving to and from a bed to a chair (including a wheelchair)?: A Little Help needed standing up from a chair using your arms (e.g., wheelchair or bedside chair)?: A Little Help needed to walk in hospital room?: A Little Help needed climbing 3-5 steps with a railing? : A Little 6 Click Score: 19    End of Session   Activity Tolerance: Patient tolerated treatment well Patient left: in chair;with call bell/phone within reach Nurse Communication: Mobility status PT Visit Diagnosis: Unsteadiness on feet (R26.81);Muscle weakness (generalized) (M62.81);History of falling (Z91.81);Difficulty in walking, not elsewhere classified (R26.2);Pain;Other abnormalities of gait and mobility (R26.89) Pain - part of body:  (abdomen)     Time: 1610-9604 PT Time Calculation (min) (ACUTE ONLY): 16 min  Charges:    $Therapeutic Activity: 8-22 mins PT General Charges $$ ACUTE PT VISIT: 1 Visit                     Aleda Grana, PT, DPT 05/16/23, 2:42 PM   Sandi Mariscal 05/16/2023, 2:41 PM

## 2023-05-16 NOTE — Progress Notes (Signed)
 Mobility Specialist - Progress Note   05/16/23 1412  Mobility  Activity Ambulated with assistance in room  Level of Assistance Contact guard assist, steadying assist  Assistive Device None  Distance Ambulated (ft) 24 ft  Activity Response Tolerated well  Mobility visit 1 Mobility  Mobility Specialist Start Time (ACUTE ONLY) 1140  Mobility Specialist Stop Time (ACUTE ONLY) 1151  Mobility Specialist Time Calculation (min) (ACUTE ONLY) 11 min   Pt semi fowler upon entry, utilizing the bedpan. Pt removed from bedpan and MS completed peri care. Pt completed bed mob ModI, extra time required to bring trunk from sup to sit. Pt STS from lowest bed height MinA and amb to the door within the room and back with CGA-HHA of the RUE-- furniture cruising. Pt returned to bed, left supine with alarm set and needs within reach.  Zetta Bills Mobility Specialist 05/16/23 2:21 PM

## 2023-05-16 NOTE — Plan of Care (Signed)

## 2023-05-16 NOTE — Progress Notes (Signed)
 PHARMACY CONSULT NOTE - ELECTROLYTES  Pharmacy Consult for Electrolyte Monitoring and Replacement   Recent Labs: Height: 4\' 11"  (149.9 cm) Weight: 41.1 kg (90 lb 9.7 oz) IBW/kg (Calculated) : 43.2 Estimated Creatinine Clearance: 49.7 mL/min (by C-G formula based on SCr of 0.45 mg/dL). Potassium (mmol/L)  Date Value  05/16/2023 3.8  04/08/2014 3.8   Magnesium (mg/dL)  Date Value  54/11/8117 1.7   Calcium (mg/dL)  Date Value  14/78/2956 7.3 (L)   Calcium, Total (mg/dL)  Date Value  21/30/8657 9.2   Albumin (g/dL)  Date Value  84/69/6295 1.6 (L)  02/09/2019 3.6 (L)  04/08/2014 4.0   Phosphorus (mg/dL)  Date Value  28/41/3244 2.4 (L)   Sodium (mmol/L)  Date Value  05/16/2023 141  02/09/2019 138  04/08/2014 140    Corrected Ca:  9.2  mg/dL        (Ca 7.3   albumin 1.6)  Assessment  Tanya Nguyen is a 58 y.o. female s/p exploratory laparotomy, lysis of adhesions, resection of previous gastrojejunostomy and jejunojejunostomy, creation of new Roux-en-Y gastrojejunostomy, new feeding jejunostomy. PMH significant for GERD, hypothyroidism, depression, PTSD, Bipolar I disorder, chronic low back pain, and electrolyte imbalance. Pharmacy has been consulted to monitor and replace electrolytes.  Diet: Tube feeds @50  ml/hr,   NPO sips w/ meds MIVF: none Pertinent medications:    Free water 30 ml per tube q4h  Goal of Therapy: Electrolytes WNL  Plan:  Mag 1.7  Will order Magnesium sulfate 2 gm IV x 1 Phos 2.4: Will order Phos-NaK 2 packets q4h x 3 Corrected Calcium= 9.2    (Ca 7.3  albumin 1.6)  No replacement at this time Will recheck electrolytes with morning labs  Thank you for allowing pharmacy to be a part of this patient's care.  Angelique Blonder, PharmD Clinical Pharmacist 05/16/2023 8:10 AM

## 2023-05-16 NOTE — Progress Notes (Signed)
 Occupational Therapy Treatment Patient Details Name: Tanya Nguyen MRN: [id number] DOB: 12-Jan-1966 Today's Date: 05/16/2023   History of present illness Pt admitted for gastric outlet obstruction and is POD2 exploratory laparotomy.   OT comments  Pt making good progress towards goals, session focused on UB/LB bathing sink level. Pt able to complete multiple STS transfers no AD with CGA, functional mobility in room from recliner to stand at sink for UB/LB bathing tasks, CGA for dynamic standing balance and setup for bathing task items. 1 posterior LOB requiring minA to correct. Pt occasionally with slow processing but follows commands consistently with increased time. Pt returned to bed, needs in reach. Discharge recommendation remains appropriate.       If plan is discharge home, recommend the following:  A little help with walking and/or transfers;A little help with bathing/dressing/bathroom;Assistance with cooking/housework;Assist for transportation;Help with stairs or ramp for entrance   Equipment Recommendations  BSC/3in1 (RW)       Precautions / Restrictions Precautions Precautions: Fall Recall of Precautions/Restrictions: Intact Restrictions Weight Bearing Restrictions Per Provider Order: No       Mobility Bed Mobility Overal bed mobility: Needs Assistance Bed Mobility: Sit to Supine     Supine to sit: Contact guard     General bed mobility comments: CGA - SUP for log roll technique back to bed    Transfers Overall transfer level: Needs assistance Equipment used: None Transfers: Sit to/from Stand Sit to Stand: Contact guard assist           General transfer comment: 3x STS transfers with CGA     Balance Overall balance assessment: Needs assistance, History of Falls Sitting-balance support: Feet supported, No upper extremity supported Sitting balance-Leahy Scale: Good     Standing balance support: During functional activity Standing balance-Leahy Scale:  Fair Standing balance comment: occ minA to correct standing LOB                           ADL either performed or assessed with clinical judgement   ADL Overall ADL's : Needs assistance/impaired     Grooming: Standing;Applying deodorant;Contact guard assist;Wash/dry hands;Wash/dry face Grooming Details (indicate cue type and reason): no LOB while standing Upper Body Bathing: Standing;Contact guard assist   Lower Body Bathing: Contact guard assist Lower Body Bathing Details (indicate cue type and reason): LB bathing in standing, CGA for safety Upper Body Dressing : Minimal assistance;Standing Upper Body Dressing Details (indicate cue type and reason): minA to don new gown                 Functional mobility during ADLs: Contact guard assist (t/f from recliner to sink)       Communication Communication Communication: No apparent difficulties   Cognition Arousal: Alert Behavior During Therapy: WFL for tasks assessed/performed               OT - Cognition Comments: mildly anxious, slow processing at times                 Following commands: Intact        Cueing   Cueing Techniques: Verbal cues        General Comments Pt has been using bed pan - educated on use of BSC with nursing staff (discussed with NT as well)    Pertinent Vitals/ Pain       Pain Assessment Pain Assessment: 0-10 Pain Score: 8  Pain Location: stomach Pain Descriptors / Indicators: Discomfort, Grimacing  Pain Intervention(s): Limited activity within patient's tolerance, Monitored during session (RN in room during session)         Frequency  Min 2X/week        Progress Toward Goals  OT Goals(current goals can now be found in the care plan section)  Progress towards OT goals: Progressing toward goals  Acute Rehab OT Goals OT Goal Formulation: With patient Time For Goal Achievement: 05/28/23 Potential to Achieve Goals: Fair ADL Goals Pt Will Perform  Grooming: with modified independence;standing Pt Will Perform Lower Body Dressing: with modified independence;sit to/from stand Pt Will Transfer to Toilet: with modified independence;ambulating Pt Will Perform Toileting - Clothing Manipulation and hygiene: with modified independence;sit to/from stand  Plan         AM-PAC OT "6 Clicks" Daily Activity     Outcome Measure   Help from another person eating meals?: None Help from another person taking care of personal grooming?: A Little Help from another person toileting, which includes using toliet, bedpan, or urinal?: A Little Help from another person bathing (including washing, rinsing, drying)?: A Little Help from another person to put on and taking off regular upper body clothing?: None Help from another person to put on and taking off regular lower body clothing?: A Little 6 Click Score: 20    End of Session    OT Visit Diagnosis: Unsteadiness on feet (R26.81);Repeated falls (R29.6);Muscle weakness (generalized) (M62.81)   Activity Tolerance Patient tolerated treatment well   Patient Left in bed;with call bell/phone within reach;with bed alarm set   Nurse Communication Mobility status        Time: 1610-9604 OT Time Calculation (min): 18 min  Charges: OT General Charges $OT Visit: 1 Visit OT Treatments $Self Care/Home Management : 8-22 mins  Angelli Baruch L. Jakirah Zaun, OTR/L  05/16/23, 2:58 PM

## 2023-05-16 NOTE — Plan of Care (Signed)

## 2023-05-16 NOTE — Progress Notes (Signed)
 [facility] SURGICAL PROGRESS NOTE  Hospital Day(s): 4.   Post op day(s): 4 Days Post-Op.   Interval History:  Patient seen and examined No acute events or new complaints overnight.  Patient reports she is having diarrhea which is her biggest concern Abdominal pain expectedly  No fever, chills, nausea, emesis Renal function normal; sCr - 0.45; UO - 550 ccs Hypophosphatemia to 2.4 NGT came out over the weekend Tube feeds held currently She is NPO Pending UGI this AM   Vital signs in last 24 hours: [min-max] current  Temp:  [97.9 F (36.6 C)-98.3 F (36.8 C)] 97.9 F (36.6 C) (03/10 0349) Pulse Rate:  [101-107] 103 (03/10 0349) Resp:  [16-18] 18 (03/10 0349) BP: (129-152)/(71-90) 152/90 (03/10 0349) SpO2:  [97 %-100 %] 100 % (03/10 0349) Weight:  [41.1 kg] 41.1 kg (03/10 0500)     Height: 4\' 11"  (149.9 cm) Weight: 41.1 kg BMI (Calculated): 18.29   Intake/Output last 2 shifts:  No intake/output data recorded.   Physical Exam:  Constitutional: alert, cooperative and no distress  Respiratory: breathing non-labored at rest  Cardiovascular: regular rate and sinus rhythm  Gastrointestinal: soft, incisional soreness, and non-distended. No rebound/guarding. Jejunostomy tube in left abdomen; clamped Integumentary: Laparotomy is CDI with staples, no erythema   Labs:     Latest Ref Rng & Units 05/15/2023    5:50 AM 05/13/2023    6:13 AM 03/30/2023    3:17 AM  CBC  WBC 4.0 - 10.5 K/uL 8.7  11.0  9.7   Hemoglobin 12.0 - 15.0 g/dL 40.9  81.1  91.4   Hematocrit 36.0 - 46.0 % 33.3  32.0  40.1   Platelets 150 - 400 K/uL 200  205  348       Latest Ref Rng & Units 05/16/2023    5:28 AM 05/15/2023    5:50 AM 05/14/2023    4:23 AM  CMP  Glucose 70 - 99 mg/dL 782  956  213   BUN 6 - 20 mg/dL 6  10  15    Creatinine 0.44 - 1.00 mg/dL 0.86  5.78  4.69   Sodium 135 - 145 mmol/L 141  139  138   Potassium 3.5 - 5.1 mmol/L 3.8  4.0  3.4   Chloride 98 - 111 mmol/L 108  108   109   CO2 22 - 32 mmol/L 27  24  24    Calcium 8.9 - 10.3 mg/dL 7.3  7.1  6.9      Imaging studies: No new pertinent imaging studies   Assessment/Plan:  58 y.o. female with electrolyte derangements otherwise doing well 4 Days Post-Op s/p exploratory laparotomy, lysis of adhesions, resection of previous gastrojejunostomy and jejunojejunostomy, creation of new Roux-en-Y gastrojejunostomy, new feeding jejunostomy.   - Awaiting UGI this morning to reassess repair(s) prior to initiation of oral diet  - Okay to continue feeds via jejunostomy after UGI; appreciate RD assistance - may need to consider formula change secondary to diarrhea if feasible   - Monitor abdominal examination - Pain control prn; antiemetics prn   - Mobilize as feasible; therapies on board  All of the above findings and recommendations were discussed with the patient, and the medical team, and all of patient's questions were answered to her expressed satisfaction.  -- Lynden Oxford, PA-C  [facility] 05/16/2023, 7:52 AM M-F: 7am - 4pm

## 2023-05-17 ENCOUNTER — Other Ambulatory Visit: Payer: PPO

## 2023-05-17 ENCOUNTER — Inpatient Hospital Stay

## 2023-05-17 LAB — VITAMIN B1: Vitamin B1 (Thiamine): 113.2 nmol/L (ref 66.5–200.0)

## 2023-05-17 LAB — RENAL FUNCTION PANEL
Albumin: 1.7 g/dL — ABNORMAL LOW (ref 3.5–5.0)
Anion gap: 7 (ref 5–15)
BUN: 6 mg/dL (ref 6–20)
CO2: 27 mmol/L (ref 22–32)
Calcium: 7.5 mg/dL — ABNORMAL LOW (ref 8.9–10.3)
Chloride: 105 mmol/L (ref 98–111)
Creatinine, Ser: 0.55 mg/dL (ref 0.44–1.00)
GFR, Estimated: >60 mL/min (ref >60)
Glucose, Bld: 107 mg/dL — ABNORMAL HIGH (ref 70–99)
Phosphorus: 3.9 mg/dL (ref 2.5–4.6)
Potassium: 3.7 mmol/L (ref 3.5–5.1)
Sodium: 139 mmol/L (ref 135–145)

## 2023-05-17 LAB — ZINC: Zinc: 18 ug/dL — ABNORMAL LOW (ref 44–115)

## 2023-05-17 LAB — MAGNESIUM: Magnesium: 2.1 mg/dL (ref 1.7–2.4)

## 2023-05-17 LAB — COPPER, SERUM: Copper: 26 ug/dL — ABNORMAL LOW (ref 80–158)

## 2023-05-17 MED ORDER — POTASSIUM CHLORIDE 20 MEQ PO PACK
20.0000 meq | PACK | Freq: Once | ORAL | Status: AC
  Administered 2023-05-17: 20 meq via ORAL
  Filled 2023-05-17: qty 1

## 2023-05-17 MED ORDER — POTASSIUM CHLORIDE 2 MEQ/ML IV SOLN
INTRAVENOUS | Status: AC
Start: 2023-05-17 — End: 2023-05-17
  Filled 2023-05-17: qty 1000

## 2023-05-17 NOTE — Progress Notes (Signed)
 [facility] SURGICAL PROGRESS NOTE  Hospital Day(s): 5.   Post op day(s): 5 Days Post-Op.   Interval History:  Patient seen and examined Unfortunately with an episode of emesis last night NGT replaced; 1300 ccs out Abdominal pain expectedly  No fever, chills, nausea, emesis Renal function normal; sCr - 0.55; UO - unmeasured x2 Albumin 1.7 Tube feeds held currently She is NPO  Vital signs in last 24 hours: [min-max] current  Temp:  [98 F (36.7 C)-98.5 F (36.9 C)] 98 F (36.7 C) (03/11 0508) Pulse Rate:  [103-109] 109 (03/11 0508) Resp:  [14-18] 16 (03/11 0508) BP: (126-138)/(66-83) 130/78 (03/11 0508) SpO2:  [97 %-100 %] 100 % (03/11 0508) Weight:  [37.7 kg] 37.7 kg (03/11 0500)     Height: 4\' 11"  (149.9 cm) Weight: 37.7 kg BMI (Calculated): 16.78   Intake/Output last 2 shifts:  03/10 0701 - 03/11 0700 In: 1614.2 [I.V.:426.7; NG/GT:1137.5; IV Piggyback:50] Out: 1900 [Emesis/NG output:1300; Blood:600]   Physical Exam:  Constitutional: alert, cooperative and no distress  HEENT: NGT in place Respiratory: breathing non-labored at rest  Cardiovascular: regular rate and sinus rhythm  Gastrointestinal: soft, incisional soreness, and non-distended. No rebound/guarding. Jejunostomy tube in left abdomen; clamped Integumentary: Laparotomy is CDI with staples, no erythema   Labs:     Latest Ref Rng & Units 05/15/2023    5:50 AM 05/13/2023    6:13 AM 03/30/2023    3:17 AM  CBC  WBC 4.0 - 10.5 K/uL 8.7  11.0  9.7   Hemoglobin 12.0 - 15.0 g/dL 40.9  81.1  91.4   Hematocrit 36.0 - 46.0 % 33.3  32.0  40.1   Platelets 150 - 400 K/uL 200  205  348       Latest Ref Rng & Units 05/17/2023    5:14 AM 05/16/2023    5:28 AM 05/15/2023    5:50 AM  CMP  Glucose 70 - 99 mg/dL 782  956  213   BUN 6 - 20 mg/dL 6  6  10    Creatinine 0.44 - 1.00 mg/dL 0.86  5.78  4.69   Sodium 135 - 145 mmol/L 139  141  139   Potassium 3.5 - 5.1 mmol/L 3.7  3.8  4.0   Chloride 98 - 111  mmol/L 105  108  108   CO2 22 - 32 mmol/L 27  27  24    Calcium 8.9 - 10.3 mg/dL 7.5  7.3  7.1      Imaging studies: No new pertinent imaging studies   Assessment/Plan:  58 y.o. female 5 Days Post-Op s/p exploratory laparotomy, lysis of adhesions, resection of previous gastrojejunostomy and jejunojejunostomy, creation of new Roux-en-Y gastrojejunostomy, new feeding jejunostomy.   - We can resume tube feeds at 1000 and advance every hour or so - Would continue NGT decompression for now; LIS; monitor and record output   - May need to repeat UGI later this week; potentially Thursday 03/13  - Monitor abdominal examination - Pain control prn; antiemetics prn   - Mobilize as feasible; therapies on board  All of the above findings and recommendations were discussed with the patient, and the medical team, and all of patient's questions were answered to her expressed satisfaction.  -- Lynden Oxford, PA-C [facility] 05/17/2023, 7:27 AM M-F: 7am - 4pm

## 2023-05-17 NOTE — Progress Notes (Signed)
   05/17/23 2051  Assess: MEWS Score  Temp 98.3 F (36.8 C)  BP 137/84  MAP (mmHg) 97  Pulse Rate (!) 116  Resp 16  Level of Consciousness Alert  SpO2 97 %  O2 Device Room Air  Patient Activity (if Appropriate) In bed  Assess: MEWS Score  MEWS Temp 0  MEWS Systolic 0  MEWS Pulse 2  MEWS RR 0  MEWS LOC 0  MEWS Score 2  MEWS Score Color Yellow  Assess: if the MEWS score is Yellow or Red  Were vital signs accurate and taken at a resting state? Yes  Does the patient meet 2 or more of the SIRS criteria? No  MEWS guidelines implemented  Yes, yellow  Treat  MEWS Interventions Considered administering scheduled or prn medications/treatments as ordered  Take Vital Signs  Increase Vital Sign Frequency  Yellow: Q2hr x1, continue Q4hrs until patient remains green for 12hrs  Escalate  MEWS: Escalate Yellow: Discuss with charge nurse and consider notifying provider and/or RRT  Notify: Charge Nurse/RN  Name of Charge Nurse/RN Notified Jodie, RN  Assess: SIRS CRITERIA  SIRS Temperature  0  SIRS Respirations  0  SIRS Pulse 1  SIRS WBC 0  SIRS Score Sum  1

## 2023-05-17 NOTE — TOC CM/SW Note (Signed)
 Patient with therapy recs for home health.  TOC to follow up with patient

## 2023-05-17 NOTE — Progress Notes (Signed)
 PHARMACY CONSULT NOTE - ELECTROLYTES  Pharmacy Consult for Electrolyte Monitoring and Replacement   Recent Labs: Height: 4\' 11"  (149.9 cm) Weight: 37.7 kg (83 lb 1.8 oz) IBW/kg (Calculated) : 43.2 Estimated Creatinine Clearance: 45.6 mL/min (by C-G formula based on SCr of 0.55 mg/dL). Potassium (mmol/L)  Date Value  05/17/2023 3.7  04/08/2014 3.8   Magnesium (mg/dL)  Date Value  09/81/1914 2.1   Calcium (mg/dL)  Date Value  78/29/5621 7.5 (L)   Calcium, Total (mg/dL)  Date Value  30/86/5784 9.2   Albumin (g/dL)  Date Value  69/62/9528 1.7 (L)  02/09/2019 3.6 (L)  04/08/2014 4.0   Phosphorus (mg/dL)  Date Value  41/32/4401 3.9   Sodium (mmol/L)  Date Value  05/17/2023 139  02/09/2019 138  04/08/2014 140    Corrected Ca:  9.3  mg/dL        (Ca 7.5   albumin 1.7)  Assessment  Tanya Nguyen is a 59 y.o. female s/p exploratory laparotomy, lysis of adhesions, resection of previous gastrojejunostomy and jejunojejunostomy, creation of new Roux-en-Y gastrojejunostomy, new feeding jejunostomy. PMH significant for GERD, hypothyroidism, depression, PTSD, Bipolar I disorder, chronic low back pain, and electrolyte imbalance. Pharmacy has been consulted to monitor and replace electrolytes.  Diet: Tube feeds @50  ml/hr,   NPO sips w/ meds MIVF: D5NS w/ 20 meq KCL @ 50 ml/hr Pertinent medications:    Free water 30 ml per tube q4h  Goal of Therapy: Electrolytes WNL  Plan:  K 3.7  Will order KCL 20 meq per tube x 1  (note: MD ordered D5NS w/KCL 20 meq/L @ 50 ml/hr starting last night 3/10) Will recheck electrolytes with morning labs  Thank you for allowing pharmacy to be a part of this patient's care.  Angelique Blonder, PharmD Clinical Pharmacist 05/17/2023 7:15 AM

## 2023-05-17 NOTE — Progress Notes (Signed)
 Occupational Therapy Treatment Patient Details Name: Tanya Nguyen MRN: [id number] DOB: October 09, 1965 Today's Date: 05/17/2023   History of present illness Pt admitted for gastric outlet obstruction and is s/p exploratory laparotomy, lysis of adhesions, resection of previous gastrojejunostomy and jejunojejunostomy, creation of new Roux-en-Y gastrojejunostomy, new feeding jejunostomy. NGT placed 3/10. PMH significant for GERD, hypothyroidism, depression, PTSD, Bipolar I disorder, chronic low back pain, and electrolyte imbalance.   OT comments  Pt seen today for skilled OT tx focusing on seated ADLs and functional transfer training. Pt reports feeling nauseated but better overall. Grooming tasks with setup in recliner, pt performing 6x sit<>stand transfers with 1 person HHA and CGA for safety. Able to tolerate standing marches and seated rest breaks in between reps. RN in room for medication administration. Pt making good progress towards goals, OT will continue to progress. Discharge recommendation remains appropriate.       If plan is discharge home, recommend the following:  A little help with walking and/or transfers;A little help with bathing/dressing/bathroom;Assistance with cooking/housework;Assist for transportation;Help with stairs or ramp for entrance   Equipment Recommendations  BSC/3in1       Precautions / Restrictions Precautions Precautions: Fall Recall of Precautions/Restrictions: Intact Restrictions Weight Bearing Restrictions Per Provider Order: No       Mobility Bed Mobility               General bed mobility comments: NT, up in recliner and left in recliner    Transfers Overall transfer level: Needs assistance Equipment used: 1 person hand held assist Transfers: Sit to/from Stand Sit to Stand: Contact guard assist           General transfer comment: 6x STS     Balance Overall balance assessment: Needs assistance, History of Falls Sitting-balance  support: Feet supported, No upper extremity supported Sitting balance-Leahy Scale: Good     Standing balance support: During functional activity Standing balance-Leahy Scale: Fair Standing balance comment: poor overall standing tolerance                           ADL either performed or assessed with clinical judgement   ADL Overall ADL's : Needs assistance/impaired Eating/Feeding: NPO   Grooming: Wash/dry hands;Sitting;Wash/dry face Grooming Details (indicate cue type and reason): seated in recliner                                     Communication Communication Communication: No apparent difficulties   Cognition Arousal: Alert Behavior During Therapy: WFL for tasks assessed/performed Cognition: No apparent impairments             OT - Cognition Comments: mildly anxious, slow processing at times                 Following commands: Intact        Cueing   Cueing Techniques: Verbal cues  Exercises Other Exercises Other Exercises: Pt performed 6x STS from recliner without BUE support with focus on BLE strengthening & overall endurance training Other Exercises: Standing marches x 10            Pertinent Vitals/ Pain       Pain Assessment Pain Assessment: Faces Faces Pain Scale: Hurts little more Pain Location: stomach Pain Descriptors / Indicators: Discomfort, Grimacing Pain Intervention(s): Limited activity within patient's tolerance (RN in room for session, pt reporting nausea)   Frequency  Min  2X/week        Progress Toward Goals  OT Goals(current goals can now be found in the care plan section)  Progress towards OT goals: Progressing toward goals  Acute Rehab OT Goals OT Goal Formulation: With patient Time For Goal Achievement: 05/28/23 Potential to Achieve Goals: Fair ADL Goals Pt Will Perform Grooming: with modified independence;standing Pt Will Perform Lower Body Dressing: with modified independence;sit  to/from stand Pt Will Transfer to Toilet: with modified independence;ambulating Pt Will Perform Toileting - Clothing Manipulation and hygiene: with modified independence;sit to/from stand  Plan         AM-PAC OT "6 Clicks" Daily Activity     Outcome Measure   Help from another person eating meals?: None Help from another person taking care of personal grooming?: A Little Help from another person toileting, which includes using toliet, bedpan, or urinal?: A Little Help from another person bathing (including washing, rinsing, drying)?: A Little Help from another person to put on and taking off regular upper body clothing?: None Help from another person to put on and taking off regular lower body clothing?: A Little 6 Click Score: 20    End of Session    OT Visit Diagnosis: Unsteadiness on feet (R26.81);Repeated falls (R29.6);Muscle weakness (generalized) (M62.81)   Activity Tolerance Patient tolerated treatment well   Patient Left with call bell/phone within reach;in chair;with chair alarm set;with nursing/sitter in room   Nurse Communication Mobility status        Time: 4098-1191 OT Time Calculation (min): 15 min  Charges: OT General Charges $OT Visit: 1 Visit OT Treatments $Self Care/Home Management : 8-22 mins  Katria Botts L. Lonnetta Kniskern, OTR/L  05/17/23, 11:54 AM

## 2023-05-17 NOTE — Progress Notes (Signed)
 Physical Therapy Treatment Patient Details Name: Tanya Nguyen MRN: [id number] DOB: 07-14-1965 Today's Date: 05/17/2023   History of Present Illness Pt admitted for gastric outlet obstruction and is s/p exploratory laparotomy, lysis of adhesions, resection of previous gastrojejunostomy and jejunojejunostomy, creation of new Roux-en-Y gastrojejunostomy, new feeding jejunostomy. NGT placed 3/10. PMH significant for GERD, hypothyroidism, depression, PTSD, Bipolar I disorder, chronic low back pain, and electrolyte imbalance.    PT Comments  Pt was long sitting in bed with NG hooked to suction and pt endorsing stomach pain. Earlier attempt, pt requested Thereasa Parkin return after pain meds and nausea meds given. PT was agreeable to OOB activity. Noticeable about of discharge from NG tube currently, so author elected not to disconnect/ cap to allow further progression away from EOB. Pt was able to tolerate getting OOB to recliner and performing there ex. HEP handout given with encouragement to perform throughout the day promote return in strength and abilities. Pt states understanding and willingness to perform throughout the day. DC recs remain appropriate to maximize her independence and safety with all ADLs.    If plan is discharge home, recommend the following: A little help with walking and/or transfers;A little help with bathing/dressing/bathroom;Assistance with cooking/housework;Assist for transportation;Help with stairs or ramp for entrance     Equipment Recommendations  BSC/3in1;Rolling walker (2 wheels)       Precautions / Restrictions Precautions Precautions: Fall Recall of Precautions/Restrictions: Intact Restrictions Weight Bearing Restrictions Per Provider Order: No     Mobility  Bed Mobility Overal bed mobility: Needs Assistance Bed Mobility: Supine to Sit  Supine to sit: Supervision  General bed mobility comments: increased time required to perform. Vcs for step by step sequencing     Transfers Overall transfer level: Needs assistance Equipment used: Rolling walker (2 wheels) Transfers: Sit to/from Stand Sit to Stand: Contact guard assist, Supervision  General transfer comment: pt performed STS from EOB and from recliner with CGA on first attempt but supervision on 2nd,3rd attempt    Ambulation/Gait Ambulation/Gait assistance: Contact guard assist Gait Distance (Feet): 5 Feet Assistive device: 1 person hand held assist Gait Pattern/deviations: Step-to pattern Gait velocity: decreased  General Gait Details: pt was easily able to ambulate form EOB to recliner. distance limited by NG currently being connected to suction with a large amount of discharge/collection   Balance Overall balance assessment: Needs assistance, History of Falls Sitting-balance support: Feet supported, No upper extremity supported Sitting balance-Leahy Scale: Good     Standing balance support: During functional activity Standing balance-Leahy Scale: Fair Standing balance comment: pt is weak from lack of nutrition which makes her a high fall risk.       Communication Communication Communication: No apparent difficulties  Cognition Arousal: Alert Behavior During Therapy: WFL for tasks assessed/performed   PT - Cognitive impairments: No apparent impairments    PT - Cognition Comments: Pt A and O x 4 Following commands: Intact      Cueing Cueing Techniques: Verbal cues     General Comments General comments (skin integrity, edema, etc.): Author issued seated exercise HEP to promote strengthening. Pt states understanding and willingness to perform throughout the day.      Pertinent Vitals/Pain Pain Assessment Pain Assessment: 0-10 Pain Score: 4  Pain Location: stomach Pain Descriptors / Indicators: Discomfort, Grimacing Pain Intervention(s): Limited activity within patient's tolerance, Monitored during session, Premedicated before session, Repositioned     PT Goals (current  goals can now be found in the care plan section) Acute Rehab PT Goals Patient  Stated Goal: to go home Progress towards PT goals: Progressing toward goals    Frequency    Min 3X/week       Co-evaluation     PT goals addressed during session: Mobility/safety with mobility;Balance;Proper use of DME;Strengthening/ROM        AM-PAC PT "6 Clicks" Mobility   Outcome Measure  Help needed turning from your back to your side while in a flat bed without using bedrails?: None Help needed moving from lying on your back to sitting on the side of a flat bed without using bedrails?: A Little Help needed moving to and from a bed to a chair (including a wheelchair)?: A Little Help needed standing up from a chair using your arms (e.g., wheelchair or bedside chair)?: A Little Help needed to walk in hospital room?: A Little Help needed climbing 3-5 steps with a railing? : A Little 6 Click Score: 19    End of Session   Activity Tolerance: Patient tolerated treatment well;Patient limited by fatigue Patient left: in chair;with call bell/phone within reach Nurse Communication: Mobility status PT Visit Diagnosis: Unsteadiness on feet (R26.81);Muscle weakness (generalized) (M62.81);History of falling (Z91.81);Difficulty in walking, not elsewhere classified (R26.2);Pain;Other abnormalities of gait and mobility (R26.89)     Time: 6962-9528 PT Time Calculation (min) (ACUTE ONLY): 19 min  Charges:    $Therapeutic Activity: 8-22 mins PT General Charges $$ ACUTE PT VISIT: 1 Visit                    Jetta Lout PTA 05/17/23, 12:51 PM

## 2023-05-17 NOTE — Progress Notes (Signed)
 Pt started back on Osmolite 1.2 at 50ml/hr. Will monitor for tolerance.  Reva Bores 05/17/23 11:42 AM

## 2023-05-18 LAB — RENAL FUNCTION PANEL
Albumin: 1.6 g/dL — ABNORMAL LOW (ref 3.5–5.0)
Anion gap: 5 (ref 5–15)
BUN: <5 mg/dL — ABNORMAL LOW (ref 6–20)
CO2: 28 mmol/L (ref 22–32)
Calcium: 7.6 mg/dL — ABNORMAL LOW (ref 8.9–10.3)
Chloride: 108 mmol/L (ref 98–111)
Creatinine, Ser: 0.53 mg/dL (ref 0.44–1.00)
GFR, Estimated: >60 mL/min (ref >60)
Glucose, Bld: 102 mg/dL — ABNORMAL HIGH (ref 70–99)
Phosphorus: 3.3 mg/dL (ref 2.5–4.6)
Potassium: 4.1 mmol/L (ref 3.5–5.1)
Sodium: 141 mmol/L (ref 135–145)

## 2023-05-18 LAB — MAGNESIUM: Magnesium: 1.9 mg/dL (ref 1.7–2.4)

## 2023-05-18 MED ORDER — CUPRIC CHLORIDE 0.4 MG/ML IV SOLN
2.0000 mg | Freq: Every day | INTRAVENOUS | Status: AC
  Administered 2023-05-18 – 2023-05-24 (×7): 2 mg via INTRAVENOUS
  Filled 2023-05-18 (×7): qty 5

## 2023-05-18 MED ORDER — PROMETHAZINE HCL 6.25 MG/5ML PO SOLN
12.5000 mg | Freq: Four times a day (QID) | ORAL | Status: DC | PRN
  Administered 2023-05-18: 12.5 mg via ORAL
  Filled 2023-05-18 (×2): qty 10

## 2023-05-18 MED ORDER — SODIUM CHLORIDE 0.9 % IV SOLN
12.5000 mg | Freq: Four times a day (QID) | INTRAVENOUS | Status: DC | PRN
  Administered 2023-05-18 – 2023-05-27 (×8): 12.5 mg via INTRAVENOUS
  Filled 2023-05-18 (×8): qty 12.5

## 2023-05-18 MED ORDER — ORAL CARE MOUTH RINSE: 15.0000 mL | OROMUCOSAL | Status: DC | PRN

## 2023-05-18 MED ORDER — ORAL CARE MOUTH RINSE
15.0000 mL | OROMUCOSAL | Status: DC
  Administered 2023-05-18 – 2023-05-27 (×35): 15 mL via OROMUCOSAL

## 2023-05-18 NOTE — Progress Notes (Signed)
@  0206 text-paged Dr. Everlene Farrier, on-call for attending, regarding pt's endorsement of nausea and impending emesis despite Zofran administratio @2308 . Page returned and order received for phenergan. Medication administered and nausea alleviated. Will continue to monitor.

## 2023-05-18 NOTE — Progress Notes (Addendum)
 PHARMACY CONSULT NOTE - ELECTROLYTES  Pharmacy Consult for Electrolyte Monitoring and Replacement   Recent Labs: Height: 4\' 11"  (149.9 cm) Weight: 39 kg (85 lb 15.7 oz) IBW/kg (Calculated) : 43.2 Estimated Creatinine Clearance: 47.2 mL/min (by C-G formula based on SCr of 0.53 mg/dL). Potassium (mmol/L)  Date Value  05/18/2023 4.1  04/08/2014 3.8   Magnesium (mg/dL)  Date Value  91/47/8295 1.9   Calcium (mg/dL)  Date Value  62/13/0865 7.6 (L)   Calcium, Total (mg/dL)  Date Value  78/46/9629 9.2   Albumin (g/dL)  Date Value  52/84/1324 1.6 (L)  02/09/2019 3.6 (L)  04/08/2014 4.0   Phosphorus (mg/dL)  Date Value  40/12/2723 3.3   Sodium (mmol/L)  Date Value  05/18/2023 141  02/09/2019 138  04/08/2014 140    Corrected Ca:  9.5  mg/dL        (Ca 7.6   albumin 1.6)  Assessment  Tanya Nguyen is a 58 y.o. female s/p exploratory laparotomy, lysis of adhesions, resection of previous gastrojejunostomy and jejunojejunostomy, creation of new Roux-en-Y gastrojejunostomy, new feeding jejunostomy. PMH significant for GERD, hypothyroidism, depression, PTSD, Bipolar I disorder, chronic low back pain, and electrolyte imbalance. Pharmacy has been consulted to monitor and replace electrolytes.  Diet: Tube feeds @50  ml/hr,   NPO sips w/ meds MIVF: none Pertinent medications:    Free water 30 ml per tube q4h  Goal of Therapy: Electrolytes WNL  Plan:  No electrolyte replacement a this time Will recheck electrolytes with morning labs  Thank you for allowing pharmacy to be a part of this patient's care.  Angelique Blonder, PharmD Clinical Pharmacist 05/18/2023 9:25 AM

## 2023-05-18 NOTE — Progress Notes (Signed)
 Nutrition Follow Up Note   DOCUMENTATION CODES:   Severe malnutrition in context of chronic illness  INTERVENTION:   Continue Osmolite 1.2 @50ml /hr per J-tube   Free water flushes 30ml q4 hours to maintain tube patency   Regimen provides 1440kcal/day, 67g/day protein and 115ml/day of free water.   Copper sulfate 2mg  IV daily x 7 days  Vitamin A 10,000 units po daily x 30 days with diet advancement   Zinc 220mg  po daily x 30 days with diet advancement   Daily weights   NUTRITION DIAGNOSIS:   Severe Malnutrition related to chronic illness as evidenced by moderate fat depletion, severe muscle depletion, 27 percent weight loss in 6 months. -ongoing   GOAL:   Patient will meet greater than or equal to 90% of their needs -met   MONITOR:   Diet advancement, Labs, Weight trends, TF tolerance, I & O's, Skin  ASSESSMENT:   58 y/o female with h/o chronic pain, GERD, open cholecystectomy, IBS-C, thyroid disease, depression, anxiety, PTSD, bipolar disorder, SI, peripheral neuropathy, IDA, PUD, kindney stones and GOO s/p exploratory laparotomy (with gastrojejunostomy with omega loop/jejunojejunostomy 01/2018) complicated by incisional hernia s/p open incisional hernia repair with mesh underlay 11/2018 and who is now admitted with malfunctioning gastrojejunostomy s/p exploratory laparotomy with lysis of adhesions, resection of previous gastrojejunostomy and jejunojejunostomy, creation of new Roux-en-Y gastrojejunostomy and new feeding jejunostomy tube (64F) 3/6.  -Pt s/p UGI 3/10; concerning for possible contrast extravasation and proximal small bowel dilation    Pt tolerating tube feeds at goal rate. Diarrhea seems to be improving. Pt remains NPO. NGT in place with output. Refeed labs stable. Pt with numerous vitamin deficiencies; will provide supplementation as appropriate. Per chart, pt is down ~3lbs from admission but remains up ~8lbs from her UBW. Plan is for repeat UGI either  tomorrow (03/13) or Friday (03/14) to reassess anastomoses per MD note. Pt will discharge home on tube feeds.   Medications reviewed and include: lovenox, synthroid, melatonin, remeron, protonix  Labs reviewed: Na 141 wnl, K 4.1 wnl, BUN <5(L), P 3.3 wnl, Mg 1.9 wnl Folate 11.3 wnl, vitamin D 71.09 wnl, vitamin A 13.5(L), vitamin E 7.5 wnl, B1 113.2 wnl, B12 3,517(H), zinc 18(L), copper 26 (L)- 3/6  Diet Order:   Diet Order             Diet NPO time specified Except for: Sips with Meds  Diet effective now                  EDUCATION NEEDS:   Education needs have been addressed  Skin:  Skin Assessment: Reviewed RN Assessment (incision abdomen)  Last BM:  3/12- type 5  Height:   Ht Readings from Last 1 Encounters:  05/13/23 4\' 11"  (1.499 m)    Weight:   Wt Readings from Last 1 Encounters:  05/18/23 39 kg    Ideal Body Weight:  44.5 kg  Estimated Nutritional Needs:   Kcal:  1200-1400kcal/day  Protein:  60-70g/day  Fluid:  1.1-1.3L/day  Betsey Holiday MS, RD, LDN If unable to be reached, please send secure chat to "RD inpatient" available from 8:00a-4:00p daily

## 2023-05-18 NOTE — Progress Notes (Signed)
 [facility] SUR[facility]ICAL PROGRESS NOTE  Hospital Day(s): 6.   Post op day(s): 6 Days Post-Op.   Interval History:  Patient seen and examined No acute events overnight Abdominal pain expectedly  No fever, chills, nausea, emesis NGT with 650 ccs; bilious appearing but dilute  Renal function normal; sCr - 0.53; UO - unmeasured x3 Albumin 1.6 Tube feeds running; 50 ml/hr She is NPO She is having bowel function   Vital signs in last 24 hours: [min-max] current  Temp:  [97.9 F (36.6 C)-98.6 F (37 C)] 98.5 F (36.9 C) (03/12 0445) Pulse Rate:  [90-116] 95 (03/12 0445) Resp:  [14-18] 14 (03/12 0445) BP: (106-137)/(72-84) 130/77 (03/12 0445) SpO2:  [97 %-100 %] 98 % (03/12 0445) Weight:  [39 kg] 39 kg (03/12 0449)     Height: 4\' 11"  (149.9 cm) Weight: 39 kg BMI (Calculated): 17.36   Intake/Output last 2 shifts:  03/11 0701 - 03/12 0700 In: 326.8 [NG/GT:326.8] Out: 652 [Urine:1; Emesis/NG output:650; Stool:1]   Physical Exam:  Constitutional: alert, cooperative and no distress  HEENT: NGT in place; bilious but dilute Respiratory: breathing non-labored at rest  Cardiovascular: regular rate and sinus rhythm  Gastrointestinal: soft, incisional soreness, and non-distended. No rebound/guarding. Jejunostomy tube in left abdomen; tube feeds running  Integumentary: Laparotomy is CDI with staples, no erythema   Labs:     Latest Ref Rng & Units 05/15/2023    5:50 AM 05/13/2023    6:13 AM 03/30/2023    3:17 AM  CBC  WBC 4.0 - 10.5 K/uL 8.7  11.0  9.7   Hemoglobin 12.0 - 15.0 g/dL 16.1  09.6  04.5   Hematocrit 36.0 - 46.0 % 33.3  32.0  40.1   Platelets 150 - 400 K/uL 200  205  348       Latest Ref Rng & Units 05/18/2023    5:36 AM 05/17/2023    5:14 AM 05/16/2023    5:28 AM  CMP  Glucose 70 - 99 mg/dL 409  811  914   BUN 6 - 20 mg/dL 5  6  6    Creatinine 0.44 - 1.00 mg/dL 7.82  9.56  2.13   Sodium 135 - 145 mmol/L 141  139  141   Potassium 3.5 - 5.1 mmol/L 4.1  3.7   3.8   Chloride 98 - 111 mmol/L 108  105  108   CO2 22 - 32 mmol/L 28  27  27    Calcium 8.9 - 10.3 mg/dL 7.6  7.5  7.3      Imaging studies: No new pertinent imaging studies   Assessment/Plan:  58 y.o. female 6 Days Post-Op s/p exploratory laparotomy, lysis of adhesions, resection of previous gastrojejunostomy and jejunojejunostomy, creation of new Roux-en-Y gastrojejunostomy, new feeding jejunostomy.   - Okay to to continue tube feeds to goal  - Would continue NGT decompression for now; LIS; monitor and record output   - Likely plan to repeat UGI either tomorrow (03/13) or Friday (03/14) to reassess anastomoses   - Monitor abdominal examination - Pain control prn; antiemetics prn   - Mobilize as feasible; therapies on board  All of the above findings and recommendations were discussed with the patient, and the medical team, and all of patient's questions were answered to her expressed satisfaction.  -- Lynden Oxford, PA-C [facility] 05/18/2023, 7:34 AM M-F: 7am - 4pm

## 2023-05-18 NOTE — Progress Notes (Signed)
 Osmolite increased to 12ml/hr. Will continue to monitor for tolerance of new rate. Reva Bores 05/18/23 9:17 AM

## 2023-05-18 NOTE — Progress Notes (Signed)
 Occupational Therapy Treatment Patient Details Name: Tanya Nguyen MRN: [id number] DOB: 05/25/1965 Today's Date: 05/18/2023   History of present illness Pt admitted for gastric outlet obstruction and is s/p exploratory laparotomy, lysis of adhesions, resection of previous gastrojejunostomy and jejunojejunostomy, creation of new Roux-en-Y gastrojejunostomy, new feeding jejunostomy. NGT placed 3/10. PMH significant for GERD, hypothyroidism, depression, PTSD, Bipolar I disorder, chronic low back pain, and electrolyte imbalance.   OT comments  Pt seen for OT tx this date, RN in room for pain meds (10/10 pain). Pt performs bed mobility with log roll technique due to abdominal discomfort, able to perform STS transfers with +1 HHA and take a few steps forwards / backwards (mobility limited due to NGT connected to suction). Performs UB/LB bathing seated in recliner with setup. Pt making progress towards goals, is motivated to participate and continues to demonstrate deficits in strength, balance and activity tolerance which impact safe, efficient performance in ADLs/mobility. OT will continue to follow for functional gains. Discharge recommendation remains appropriate.       If plan is discharge home, recommend the following:  A little help with walking and/or transfers;A little help with bathing/dressing/bathroom;Assistance with cooking/housework;Assist for transportation;Help with stairs or ramp for entrance   Equipment Recommendations  BSC/3in1       Precautions / Restrictions Precautions Precautions: Fall Restrictions Weight Bearing Restrictions Per Provider Order: No       Mobility Bed Mobility Overal bed mobility: Needs Assistance Bed Mobility: Rolling, Sidelying to Sit Rolling: Supervision Sidelying to sit: Supervision       General bed mobility comments: logrolling for comfort due to abdominal pain    Transfers Overall transfer level: Needs assistance Equipment used: 1 person  hand held assist Transfers: Sit to/from Stand Sit to Stand: Contact guard assist           General transfer comment: performing 3x STS from EOB, light HHA assist     Balance Overall balance assessment: Needs assistance, History of Falls Sitting-balance support: Feet supported, No upper extremity supported Sitting balance-Leahy Scale: Good     Standing balance support: During functional activity Standing balance-Leahy Scale: Fair Standing balance comment: pt is weak from lack of nutrition which makes her a high fall risk.                           ADL either performed or assessed with clinical judgement   ADL Overall ADL's : Needs assistance/impaired     Grooming: Wash/dry hands;Sitting;Wash/dry face Grooming Details (indicate cue type and reason): recliner level Upper Body Bathing: Sitting;Set up   Lower Body Bathing: Set up;Sit to/from stand Lower Body Bathing Details (indicate cue type and reason): able to perform sponge bath, setup for task items but no physical assist Upper Body Dressing : Sitting;Minimal assistance Upper Body Dressing Details (indicate cue type and reason): dons/doffs new gown                 Functional mobility during ADLs: Contact guard assist       Communication Communication Communication: No apparent difficulties   Cognition Arousal: Alert Behavior During Therapy: WFL for tasks assessed/performed Cognition: No apparent impairments             OT - Cognition Comments: mildly anxious, slow processing at times                 Following commands: Intact        Cueing   Cueing Techniques: Verbal cues  General Comments lines/leads intact start and end of session, staples clean and dry    Pertinent Vitals/ Pain       Pain Assessment Pain Assessment: 0-10 Pain Score: 6  (10/10 start of session, RN provided meds) Pain Location: stomach Pain Descriptors / Indicators: Discomfort, Grimacing Pain  Intervention(s): Limited activity within patient's tolerance, RN gave pain meds during session, Monitored during session         Frequency  Min 2X/week        Progress Toward Goals  OT Goals(current goals can now be found in the care plan section)  Progress towards OT goals: Progressing toward goals  Acute Rehab OT Goals OT Goal Formulation: With patient Time For Goal Achievement: 05/28/23 Potential to Achieve Goals: Fair ADL Goals Pt Will Perform Grooming: with modified independence;standing Pt Will Perform Lower Body Dressing: with modified independence;sit to/from stand Pt Will Transfer to Toilet: with modified independence;ambulating Pt Will Perform Toileting - Clothing Manipulation and hygiene: with modified independence;sit to/from stand  Plan         AM-PAC OT "6 Clicks" Daily Activity     Outcome Measure   Help from another person eating meals?: None Help from another person taking care of personal grooming?: A Little Help from another person toileting, which includes using toliet, bedpan, or urinal?: A Little Help from another person bathing (including washing, rinsing, drying)?: A Little Help from another person to put on and taking off regular upper body clothing?: None Help from another person to put on and taking off regular lower body clothing?: A Little 6 Click Score: 20    End of Session    OT Visit Diagnosis: Unsteadiness on feet (R26.81);Repeated falls (R29.6);Muscle weakness (generalized) (M62.81)   Activity Tolerance Patient tolerated treatment well   Patient Left with call bell/phone within reach;in chair;with chair alarm set   Nurse Communication Mobility status        Time: 1235-1315 OT Time Calculation (min): 40 min  Charges: OT General Charges $OT Visit: 1 Visit OT Treatments $Self Care/Home Management : 38-52 mins Meghanne Pletz L. Consetta Cosner, OTR/L  05/18/23, 1:19 PM

## 2023-05-19 ENCOUNTER — Inpatient Hospital Stay

## 2023-05-19 LAB — RENAL FUNCTION PANEL
Albumin: 1.7 g/dL — ABNORMAL LOW (ref 3.5–5.0)
Anion gap: 6 (ref 5–15)
BUN: <5 mg/dL — ABNORMAL LOW (ref 6–20)
CO2: 29 mmol/L (ref 22–32)
Calcium: 7.6 mg/dL — ABNORMAL LOW (ref 8.9–10.3)
Chloride: 104 mmol/L (ref 98–111)
Creatinine, Ser: 0.53 mg/dL (ref 0.44–1.00)
GFR, Estimated: >60 mL/min (ref >60)
Glucose, Bld: 112 mg/dL — ABNORMAL HIGH (ref 70–99)
Phosphorus: 3.8 mg/dL (ref 2.5–4.6)
Potassium: 4.3 mmol/L (ref 3.5–5.1)
Sodium: 139 mmol/L (ref 135–145)

## 2023-05-19 LAB — GLUCOSE, CAPILLARY: Glucose-Capillary: 99 mg/dL (ref 70–99)

## 2023-05-19 MED ORDER — KCL IN DEXTROSE-NACL 20-5-0.9 MEQ/L-%-% IV SOLN
INTRAVENOUS | Status: DC
  Filled 2023-05-19: qty 1000

## 2023-05-19 MED ORDER — KCL IN DEXTROSE-NACL 20-5-0.45 MEQ/L-%-% IV SOLN
INTRAVENOUS | Status: AC
  Filled 2023-05-19: qty 1000

## 2023-05-19 MED ORDER — POTASSIUM CHLORIDE 2 MEQ/ML IV SOLN
INTRAVENOUS | Status: DC
  Filled 2023-05-19: qty 1000

## 2023-05-19 NOTE — Progress Notes (Signed)
 PT Cancellation Note  Patient Details Name: Tanya Nguyen MRN: [id number] DOB: May 09, 1965   Cancelled Treatment:     PT attempt. Pt is alert but immediately begins crying upon author arriving." I'm just not having a good day. They said I might have to get imaging every 30 minutes." Pt politely requested author return tomorrow. Acute PT will continue to follow and progress per current POC.    Rushie Chestnut 05/19/2023, 1:56 PM

## 2023-05-19 NOTE — Progress Notes (Signed)
 [facility]GRESS NOTE  Hospital Day(s): 7.   Post op day(s): 7 Days Post-Op.   Interval History:  Patient seen and examined No acute events overnight No fever, chills, nausea, emesis NGT with 1250 ccs; bilious appearing but dilute  Renal function normal; sCr - 0.53; UO - unmeasured x4 Albumin 1.7 Tube feeds running; 50 ml/hr She is NPO She is having bowel function   Vital signs in last 24 hours: [min-max] current  Temp:  [98.2 F (36.8 C)-98.6 F (37 C)] 98.2 F (36.8 C) (03/13 0427) Pulse Rate:  [101-110] 101 (03/13 0427) Resp:  [16-18] 17 (03/13 0427) BP: (116-148)/(72-94) 116/75 (03/13 0427) SpO2:  [97 %-100 %] 99 % (03/13 0427) Weight:  [36.1 kg] 36.1 kg (03/13 0428)     Height: 4\' 11"  (149.9 cm) Weight: 36.1 kg BMI (Calculated): 16.07   Intake/Output last 2 shifts:  03/12 0701 - 03/13 0700 In: 1094.2 [NG/GT:854.2; IV Piggyback:150] Out: 1251 [Emesis/NG output:1250; Stool:1]   Physical Exam:  Constitutional: alert, cooperative and no distress  HEENT: NGT in place; bilious but dilute Respiratory: breathing non-labored at rest  Cardiovascular: regular rate and sinus rhythm  Gastrointestinal: soft, incisional soreness, and non-distended. No rebound/guarding. Jejunostomy tube in left abdomen; tube feeds running  Integumentary: Laparotomy is CDI with staples, no erythema   Labs:     Latest Ref Rng & Units 05/15/2023    5:50 AM 05/13/2023    6:13 AM 03/30/2023    3:17 AM  CBC  WBC 4.0 - 10.5 K/uL 8.7  11.0  9.7   Hemoglobin 12.0 - 15.0 g/dL 46.9  62.9  52.8   Hematocrit 36.0 - 46.0 % 33.3  32.0  40.1   Platelets 150 - 400 K/uL 200  205  348       Latest Ref Rng & Units 05/19/2023    5:37 AM 05/18/2023    5:36 AM 05/17/2023    5:14 AM  CMP  Glucose 70 - 99 mg/dL 413  244  010   BUN 6 - 20 mg/dL <5  <5  6   Creatinine 0.44 - 1.00 mg/dL 2.72  5.36  6.44   Sodium 135 - 145 mmol/L 139  141  139   Potassium 3.5 - 5.1 mmol/L 4.3  4.1  3.7    Chloride 98 - 111 mmol/L 104  108  105   CO2 22 - 32 mmol/L 29  28  27    Calcium 8.9 - 10.3 mg/dL 7.6  7.6  7.5      Imaging studies: No new pertinent imaging studies   Assessment/Plan:  58 y.o. female 7 Days Post-Op s/p exploratory laparotomy, lysis of adhesions, resection of previous gastrojejunostomy and jejunojejunostomy, creation of new Roux-en-Y gastrojejunostomy, new feeding jejunostomy.   - We will plan for repeat UGI at some point today to reassess anastomoses prior to consideration of restarting PO intake.   - Okay to to continue tube feeds to goal; hold if needed for UGI - Would continue NGT decompression for now; LIS; monitor and record output   - Monitor abdominal examination - Pain control prn; antiemetics prn   - Mobilize as feasible; therapies on board  All of the above findings and recommendations were discussed with the patient, and the medical team, and all of patient's questions were answered to her expressed satisfaction.  -- Lynden Oxford, PA-C [facility] 05/19/2023, 7:36 AM M-F: 7am - 4pm

## 2023-05-19 NOTE — TOC Progression Note (Signed)
 Transition of Care [facility]) - Progression Note    Patient Details  Name: Tanya Nguyen MRN: [id number] Date of Birth: January 15, 1966  Transition of Care [facility]) CM/SW Contact  Hetty Ely, RN Phone Number: 05/19/2023, 4:11 PM  Clinical Narrative: Jeri Modena, reports that she will not be able to service and recommends  Charlesetta Ivory, [phone] if needed.           Expected Discharge Plan and Services                                               Social Determinants of Health (SDOH) Interventions SDOH Screenings   Food Insecurity: No Food Insecurity (05/13/2023)  Housing: Low Risk  (05/13/2023)  Transportation Needs: No Transportation Needs (05/13/2023)  Utilities: Not At Risk (05/13/2023)  Depression (PHQ2-9): Medium Risk (03/17/2023)  Social Connections: Socially Integrated (03/30/2023)  Tobacco Use: Low Risk  (05/12/2023)    Readmission Risk Interventions     No data to display

## 2023-05-19 NOTE — Progress Notes (Signed)
 PHARMACY CONSULT NOTE - ELECTROLYTES  Pharmacy Consult for Electrolyte Monitoring and Replacement   Recent Labs: Height: 4\' 11"  (149.9 cm) Weight: 36.1 kg (79 lb 9.4 oz) IBW/kg (Calculated) : 43.2 Estimated Creatinine Clearance: 43.7 mL/min (by C-G formula based on SCr of 0.53 mg/dL). Potassium (mmol/L)  Date Value  05/19/2023 4.3  04/08/2014 3.8   Magnesium (mg/dL)  Date Value  55/73/2202 1.9   Calcium (mg/dL)  Date Value  54/27/0623 7.6 (L)   Calcium, Total (mg/dL)  Date Value  76/28/3151 9.2   Albumin (g/dL)  Date Value  76/16/0737 1.7 (L)  02/09/2019 3.6 (L)  04/08/2014 4.0   Phosphorus (mg/dL)  Date Value  10/62/6948 3.8   Sodium (mmol/L)  Date Value  05/19/2023 139  02/09/2019 138  04/08/2014 140    Corrected Ca:  9.5  mg/dL        (Ca 7.6   albumin 1.7)  Assessment  Tanya Nguyen is a 58 y.o. female s/p exploratory laparotomy, lysis of adhesions, resection of previous gastrojejunostomy and jejunojejunostomy, creation of new Roux-en-Y gastrojejunostomy, new feeding jejunostomy. PMH significant for GERD, hypothyroidism, depression, PTSD, Bipolar I disorder, chronic low back pain, and electrolyte imbalance. Pharmacy has been consulted to monitor and replace electrolytes.  Diet: Tube feeds @50  ml/hr,   NPO sips w/ meds MIVF: none Pertinent medications:    Free water 30 ml per tube q4h  Goal of Therapy: Electrolytes WNL  Plan:  No electrolyte replacement a this time Will recheck electrolytes with morning labs  Thank you for allowing pharmacy to be a part of this patient's care.  Angelique Blonder, PharmD Clinical Pharmacist 05/19/2023 8:58 AM

## 2023-05-19 NOTE — Plan of Care (Signed)

## 2023-05-19 NOTE — Plan of Care (Signed)

## 2023-05-20 ENCOUNTER — Inpatient Hospital Stay

## 2023-05-20 LAB — RENAL FUNCTION PANEL
Albumin: 1.8 g/dL — ABNORMAL LOW (ref 3.5–5.0)
Anion gap: 7 (ref 5–15)
BUN: 8 mg/dL (ref 6–20)
CO2: 30 mmol/L (ref 22–32)
Calcium: 7.7 mg/dL — ABNORMAL LOW (ref 8.9–10.3)
Chloride: 101 mmol/L (ref 98–111)
Creatinine, Ser: 0.54 mg/dL (ref 0.44–1.00)
GFR, Estimated: >60 mL/min (ref >60)
Glucose, Bld: 105 mg/dL — ABNORMAL HIGH (ref 70–99)
Phosphorus: 3.9 mg/dL (ref 2.5–4.6)
Potassium: 4.1 mmol/L (ref 3.5–5.1)
Sodium: 138 mmol/L (ref 135–145)

## 2023-05-20 LAB — MAGNESIUM: Magnesium: 1.8 mg/dL (ref 1.7–2.4)

## 2023-05-20 MED ORDER — MAGNESIUM SULFATE 2 GM/50ML IV SOLN
2.0000 g | Freq: Once | INTRAVENOUS | Status: AC
  Administered 2023-05-20: 2 g via INTRAVENOUS
  Filled 2023-05-20: qty 50

## 2023-05-20 NOTE — Progress Notes (Signed)
 OT Cancellation Note  Patient Details Name: Tanya Nguyen MRN: [id number] DOB: [birth year]   Cancelled Treatment:    Reason Eval/Treat Not Completed: Pain limiting ability to participate. Pt received with RN in room, returned to bed after up to [facility] with elevated pain levels 10/10 (RN to administer meds).  Pt politely declines OT session at this time. OT will check back as able.   [name], OTR/L  05/20/23, 4:13 PM

## 2023-05-20 NOTE — Progress Notes (Signed)
 Physical Therapy Treatment Patient Details Name: Tanya Nguyen MRN: [id number] DOB: [birth year] Today's Date: 05/20/2023   History of Present Illness Pt admitted for gastric outlet obstruction and is s/p exploratory laparotomy, lysis of adhesions, resection of previous gastrojejunostomy and jejunojejunostomy, creation of new Roux-en-Y gastrojejunostomy, new feeding jejunostomy. NGT placed 3/10. PMH significant for GERD, hypothyroidism, depression, PTSD, Bipolar I disorder, chronic low back pain, and electrolyte imbalance.    PT Comments  Pt was long sitting I bed upon arrival. She is alert and cooperative. Eager to get OOB and ambulate. RN capped NG/suction. Pt was able to safely exit bed, stand to IV pole, and ambulate ~ 150 ft. Pt has slow cautious gait with narrow BOS. No LOB or intervention required.  Pt tolerated session well. DC recs remain appropriate.    If plan is discharge home, recommend the following: A little help with walking and/or transfers;A little help with bathing/dressing/bathroom;Assistance with cooking/housework;Assist for transportation;Help with stairs or ramp for entrance     Equipment Recommendations  BSC/3in1;Rolling walker (2 wheels)       Precautions / Restrictions Precautions Precautions: Fall Recall of Precautions/Restrictions: Intact Restrictions Weight Bearing Restrictions Per Provider Order: No     Mobility  Bed Mobility Overal bed mobility: Needs Assistance Bed Mobility: Rolling, Sidelying to Sit Rolling: Supervision Sidelying to sit: Supervision Supine to sit: Supervision Sit to supine: Supervision General bed mobility comments: increased time required to perform.    Transfers Overall transfer level: Needs assistance Equipment used: 1 person hand held assist Transfers: Sit to/from Stand Sit to Stand: Contact guard assist, Min assist   Ambulation/Gait Ambulation/Gait assistance: Contact guard assist Gait Distance (Feet): 150 Feet Assistive  device: IV Pole, 1 person hand held assist Gait Pattern/deviations: Step-through pattern Gait velocity: decreased  General Gait Details: pt was able to ambulate ~ 150 ft with +1 HHA and opposite UE support on IV pole. narrow BOS at times however no intervention required. Overall tolerated ambulation well.    Balance Overall balance assessment: Needs assistance, History of Falls Sitting-balance support: Feet supported, No upper extremity supported Sitting balance-Leahy Scale: Good     Standing balance support: During functional activity Standing balance-Leahy Scale: Fair     Hotel manager: No apparent difficulties  Cognition Arousal: Alert Behavior During Therapy: WFL for tasks assessed/performed   PT - Cognitive impairments: No apparent impairments    PT - Cognition Comments: Pt A and O x 4 Following commands: Intact      Cueing Cueing Techniques: Verbal cues         Pertinent Vitals/Pain Pain Assessment Pain Assessment: 0-10 Pain Score: 4  Pain Location: stomach Pain Descriptors / Indicators: Discomfort, Grimacing Pain Intervention(s): Limited activity within patient's tolerance, Monitored during session, Premedicated before session, Repositioned     PT Goals (current goals can now be found in the care plan section) Acute Rehab PT Goals Patient Stated Goal: get better so I can go home Progress towards PT goals: Progressing toward goals    Frequency    Min 3X/week       Co-evaluation     PT goals addressed during session: Mobility/safety with mobility;Balance;Proper use of DME;Strengthening/ROM        AM-PAC PT "6 Clicks" Mobility   Outcome Measure  Help needed turning from your back to your side while in a flat bed without using bedrails?: None Help needed moving from lying on your back to sitting on the side of a flat bed without using bedrails?: A Little Help needed  moving to and from a bed to a chair (including a  wheelchair)?: A Lot Help needed standing up from a chair using your arms (e.g., wheelchair or bedside chair)?: A Lot Help needed to walk in hospital room?: A Lot Help needed climbing 3-5 steps with a railing? : A Little 6 Click Score: 16    End of Session   Activity Tolerance: Patient tolerated treatment well;Patient limited by fatigue;Other (comment) (nausea) Patient left: in bed;with call bell/phone within reach;with bed alarm set Nurse Communication: Mobility status PT Visit Diagnosis: Unsteadiness on feet (R26.81);Muscle weakness (generalized) (M62.81);History of falling (Z91.81);Difficulty in walking, not elsewhere classified (R26.2);Pain;Other abnormalities of gait and mobility (R26.89)     Time: 1610-9604 PT Time Calculation (min) (ACUTE ONLY): 23 min  Charges:    $Gait Training: 8-22 mins $Therapeutic Activity: 8-22 mins PT General Charges $$ ACUTE PT VISIT: 1 Visit                    Jetta Lout PTA 05/20/23, 3:56 PM

## 2023-05-20 NOTE — Plan of Care (Signed)
 Alert and oriented, medicated frequently for pain and nausea, see MAR for details.  Family at bedside today.  Tube feeds remain off/stopped.  Refused mobility today.   Problem: Education: Goal: Knowledge of General Education information will improve Description: Including pain rating scale, medication(s)/side effects and non-pharmacologic comfort measures Outcome: Progressing   Problem: Health Behavior/Discharge Planning: Goal: Ability to manage health-related needs will improve Outcome: Progressing   Problem: Clinical Measurements: Goal: Ability to maintain clinical measurements within normal limits will improve Outcome: Progressing Goal: Will remain free from infection Outcome: Progressing

## 2023-05-20 NOTE — Progress Notes (Signed)
 PHARMACY CONSULT NOTE - ELECTROLYTES  Pharmacy Consult for Electrolyte Monitoring and Replacement   Recent Labs: Height: 4\' 11"  (149.9 cm) Weight: 35.4 kg (78 lb 0.7 oz) IBW/kg (Calculated) : 43.2 Estimated Creatinine Clearance: 42.8 mL/min (by C-G formula based on SCr of 0.54 mg/dL). Potassium (mmol/L)  Date Value  05/20/2023 4.1  04/08/2014 3.8   Magnesium (mg/dL)  Date Value  16/12/9602 1.8   Calcium (mg/dL)  Date Value  54/11/8117 7.7 (L)   Calcium, Total (mg/dL)  Date Value  14/78/2956 9.2   Albumin (g/dL)  Date Value  21/30/8657 1.8 (L)  02/09/2019 3.6 (L)  04/08/2014 4.0   Phosphorus (mg/dL)  Date Value  84/69/6295 3.9   Sodium (mmol/L)  Date Value  05/20/2023 138  02/09/2019 138  04/08/2014 140    Corrected Ca:  9.5  mg/dL        (Ca 7.7   albumin 1.8)  Assessment  Tanya Nguyen is a 58 y.o. female s/p exploratory laparotomy, lysis of adhesions, resection of previous gastrojejunostomy and jejunojejunostomy, creation of new Roux-en-Y gastrojejunostomy, new feeding jejunostomy. PMH significant for GERD, hypothyroidism, depression, PTSD, Bipolar I disorder, chronic low back pain, and electrolyte imbalance. Pharmacy has been consulted to monitor and replace electrolytes.  Diet: Tube feeds @50  ml/hr,   NPO sips w/ meds MIVF: none Pertinent medications:    Free water 30 ml per tube q4h  Goal of Therapy: Electrolytes WNL  Plan:  Mag 1.8  Will replace with Magnesium sulfate 2 gm IV x1 Will recheck electrolytes with morning labs  Thank you for allowing pharmacy to be a part of this patient's care.  Angelique Blonder, PharmD Clinical Pharmacist 05/20/2023 8:38 AM

## 2023-05-20 NOTE — Progress Notes (Addendum)
 Patient seen and evaluated with Mr. Manus Rudd and agree with his note.  Patient's UGI yesterday showed slow passage of contrast without leak at the anastomoses, but hard to pass by the J tube.  There was concern for contrast leak in the pelvis based on one of the xrays, and CT scan was done.  This did not show any leak, and the contrast was reaching the terminal ileum.  KUB today shows the contrast in the colon.  Overall slow advancement.  No acute events overnight.  Discussed with patient plan to keep NG to suction over weekend, possibly clamp if low output, and start likely d/c'ing the NG early next week and start to see how she tolerates slow diet advancement.  Continue J tube feeds today.  Henrene Dodge, MD    [facility] SURGICAL PROGRESS NOTE  Hospital Day(s): 8.   Post op day(s): 8 Days Post-Op.   Interval History:  Patient seen and examined No acute events overnight She is doing well considering NGT with 800 ccs; bilious appearing but dilute  Renal function normal; sCr - 0.54; UO - unmeasured x4 Albumin 1.8 Tube feeds held overnight  She is NPO She is having bowel function; multiple stools   She did have UGI yesterday and SBFT which was concerning for slow progression of contrast. There was again concern for possible extravasation of oral contrast n her XR and CT A/P was ordered which did not show any perforation or extravasation of contrast. PO contrast was able to traverse J-tube this morning on KUB and was seen in colon.    Vital signs in last 24 hours: [min-max] current  Temp:  [97.7 F (36.5 C)-98.4 F (36.9 C)] 98.4 F (36.9 C) (03/14 0438) Pulse Rate:  [99-107] 101 (03/14 0743) Resp:  [15-18] 15 (03/14 0743) BP: (112-129)/(65-87) 116/65 (03/14 0743) SpO2:  [100 %] 100 % (03/14 0743) Weight:  [35.4 kg] 35.4 kg (03/14 0441)     Height: 4\' 11"  (149.9 cm) Weight: 35.4 kg BMI (Calculated): 15.75   Intake/Output last 2 shifts:  03/13 0701 - 03/14 0700 In:  -  Out: 800 [Emesis/NG output:800]   Physical Exam:  Constitutional: alert, cooperative and no distress  HEENT: NGT in place; bilious but dilute Respiratory: breathing non-labored at rest  Cardiovascular: regular rate and sinus rhythm  Gastrointestinal: soft, incisional soreness, and non-distended. No rebound/guarding. Jejunostomy tube in left abdomen; clamped  Integumentary: Laparotomy is CDI with staples, no erythema   Labs:     Latest Ref Rng & Units 05/15/2023    5:50 AM 05/13/2023    6:13 AM 03/30/2023    3:17 AM  CBC  WBC 4.0 - 10.5 K/uL 8.7  11.0  9.7   Hemoglobin 12.0 - 15.0 g/dL 16.1  09.6  04.5   Hematocrit 36.0 - 46.0 % 33.3  32.0  40.1   Platelets 150 - 400 K/uL 200  205  348       Latest Ref Rng & Units 05/20/2023    4:31 AM 05/19/2023    5:37 AM 05/18/2023    5:36 AM  CMP  Glucose 70 - 99 mg/dL 409  811  914   BUN 6 - 20 mg/dL 8  <5  <5   Creatinine 0.44 - 1.00 mg/dL 7.82  9.56  2.13   Sodium 135 - 145 mmol/L 138  139  141   Potassium 3.5 - 5.1 mmol/L 4.1  4.3  4.1   Chloride 98 - 111 mmol/L 101  104  108   CO2 22 - 32 mmol/L 30  29  28    Calcium 8.9 - 10.3 mg/dL 7.7  7.6  7.6      Imaging studies:   KUB (05/20/2023) personally reviewed now with PO contrast seen in colon, and radiologist report pending...   Assessment/Plan:  58 y.o. female 8 Days Post-Op s/p exploratory laparotomy, lysis of adhesions, resection of previous gastrojejunostomy and jejunojejunostomy, creation of new Roux-en-Y gastrojejunostomy, new feeding jejunostomy.   - Keep NGT over the weekend; LIS; monitor and record output  - On Monday, we can start to work towards discharge plan  - We can restart tube feedings this AM  - Monitor abdominal examination - Pain control prn; antiemetics prn   - Mobilize as feasible; therapies on board  All of the above findings and recommendations were discussed with the patient, and the medical team, and all of patient's questions were answered to her  expressed satisfaction.  -- Lynden Oxford, PA-C [facility] 05/20/2023, 7:56 AM M-F: 7am - 4pm

## 2023-05-20 NOTE — Plan of Care (Signed)

## 2023-05-20 NOTE — TOC Initial Note (Addendum)
 Transition of Care [facility]) - Initial/Assessment Note    Patient Details  Name: Tanya Nguyen MRN: [id number] Date of Birth: [birth year]  Transition of [facility] CM/SW Contact:    Liliana Cline, LCSW Phone Number: 05/20/2023, 12:31 PM  Clinical Narrative:                 CSW met with patient at bedside. Patient is from home with husband. Patient drives at baseline, husband is able to provide transport. PCP is Vernona Rieger. Pharmacy is [facility]. Patient has a RW, grab bars, and shower seat at home.  Per MD and RD, patient will need tube feeds at home. Patient aware and agreeable today at bedside, declines agency preferences. Weekday TOC had reached out to [facility] and [facility] who are not able to accept the patient. Mitch with [facility] is checking if he is in network with patient's insurance plan. Patient is agreeable to HHPT, OT, and RN, declines agency preferences - Kandee Keen with [facility] is able to accept.  3:00- Mitch with [facility] is able to provide tube feeds, they are in network with patient's insurance plan. Form filled out to order home tube feeds with assistance of RD, left on chart for MD to sign. MD notified.  Expected Discharge Plan: Home w Home Health Services Barriers to Discharge: Continued Medical Work up   Patient Goals and CMS Choice   CMS Medicare.gov Compare Post Acute Care list provided to:: Patient Choice offered to / list presented to : Patient      Expected Discharge Plan and Services       Living arrangements for the past 2 months: Single Family Home                 DME Arranged: Tube feeding DME Agency: [facility] Date DME Agency Contacted: 05/20/23   Representative spoke with at DME Agency: Mitch HH Arranged: RN, PT, OT [facility] Agency: [facility] Date [facility] Agency Contacted: 05/20/23   Representative spoke with at [facility] Agency: Kandee Keen  Prior Living Arrangements/Services Living arrangements for the past 2 months: Single Family Home Lives with::  Spouse Patient language and need for interpreter reviewed:: Yes Do you feel safe going back to the place where you live?: Yes      Need for Family Participation in Patient Care: Yes (Comment) Care giver support system in place?: Yes (comment) Current home services: DME Criminal Activity/Legal Involvement Pertinent to Current Situation/Hospitalization: No - Comment as needed  Activities of Daily Living   ADL Screening (condition at time of admission) Independently performs ADLs?: Yes (appropriate for developmental age) Is the patient deaf or have difficulty hearing?: No Does the patient have difficulty seeing, even when wearing glasses/contacts?: No Does the patient have difficulty concentrating, remembering, or making decisions?: No  Permission Sought/Granted Permission sought to share information with : Facility Industrial/product designer granted to share information with : Yes, Verbal Permission Granted     Permission granted to share info w AGENCY: HH and DME agencies        Emotional Assessment       Orientation: : Oriented to Self, Oriented to  Time, Oriented to Place, Oriented to Situation Alcohol / Substance Use: Not Applicable Psych Involvement: No (comment)  Admission diagnosis:  Gastric outlet obstruction [K31.1] Patient Active Problem List   Diagnosis Date Noted   Gastric outlet obstruction 05/12/2023   Nausea vomiting and diarrhea 03/30/2023   Hyponatremia 03/29/2023   Hypomagnesemia 03/29/2023   Coffee ground emesis 03/29/2023  Hypokalemia 03/29/2023   Thrombocytosis 03/29/2023   Prediabetes 11/23/2021   Preventative health care 11/23/2021   Pain management contract signed 09/17/2021   Muscle spasm 09/17/2021   Anxiety 09/02/2021   Iron deficiency anemia 05/18/2021   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   Bilateral foot pain 03/13/2020   Chronic pain syndrome 03/13/2020   Idiopathic peripheral neuropathy 07/23/2019   Urge incontinence  08/11/2018   Protein-calorie malnutrition, severe 01/20/2018   Vitamin D deficiency 07/28/2017   Vitamin B 12 deficiency 07/28/2017   Chronic low back pain 07/28/2017   Plantar fascial fibromatosis of both feet 11/10/2016   Bipolar I disorder, most recent episode depressed (HCC) 03/01/2015   PTSD (post-traumatic stress disorder) 02/27/2015   Weakness 12/13/2013   Insomnia 08/30/2013   Weight loss 03/22/2013   GERD (gastroesophageal reflux disease)    Migraines    Hypothyroidism    Depression    PCP:  Doreene Nest, NP [facility] [address] [place] [facility] [address] [place] [address] Phone: [phone] Fax: [phone]     Social Drivers of Health (SDOH) Social History: SDOH Screenings   Food Insecurity: No Food Insecurity (05/13/2023)  Housing: Low Risk  (05/13/2023)  Transportation Needs: No Transportation Needs (05/13/2023)  Utilities: Not At Risk (05/13/2023)  Depression (PHQ2-9): Medium Risk (03/17/2023)  Social Connections: Socially Integrated (03/30/2023)  Tobacco Use: Low Risk  (05/12/2023)   SDOH Interventions:     Readmission Risk Interventions    05/20/2023   12:24 PM  Readmission Risk Prevention Plan  Transportation Screening Complete  PCP or Specialist Appt within 5-7 Days Complete  Home Care Screening Complete  Medication Review (RN CM) Complete

## 2023-05-21 LAB — RENAL FUNCTION PANEL
Albumin: 1.7 g/dL — ABNORMAL LOW (ref 3.5–5.0)
Anion gap: 5 (ref 5–15)
BUN: 9 mg/dL (ref 6–20)
CO2: 29 mmol/L (ref 22–32)
Calcium: 7.5 mg/dL — ABNORMAL LOW (ref 8.9–10.3)
Chloride: 100 mmol/L (ref 98–111)
Creatinine, Ser: 0.61 mg/dL (ref 0.44–1.00)
GFR, Estimated: >60 mL/min (ref >60)
Glucose, Bld: 109 mg/dL — ABNORMAL HIGH (ref 70–99)
Phosphorus: 4.1 mg/dL (ref 2.5–4.6)
Potassium: 3.7 mmol/L (ref 3.5–5.1)
Sodium: 134 mmol/L — ABNORMAL LOW (ref 135–145)

## 2023-05-21 LAB — GLUCOSE, CAPILLARY: Glucose-Capillary: 107 mg/dL — ABNORMAL HIGH (ref 70–99)

## 2023-05-21 LAB — MAGNESIUM: Magnesium: 2.1 mg/dL (ref 1.7–2.4)

## 2023-05-21 MED ORDER — SUMATRIPTAN SUCCINATE 50 MG PO TABS
50.0000 mg | ORAL_TABLET | ORAL | Status: DC | PRN
  Administered 2023-05-21: 50 mg via ORAL
  Filled 2023-05-21: qty 1

## 2023-05-21 NOTE — Progress Notes (Signed)
 [facility] SUR[facility]ICAL PROGRESS NOTE  Hospital Day(s): 9.   Post op day(s): 9 Days Post-Op.   Interval History:  Patient seen and examined No acute events overnight She is doing well considering NGT with 600 ccs; bilious appearing but dilute  Renal function appears stable Albumin 1.8 Tube feeds resumed and tolerating well She remains NPO She is having bowel function; multiple stools     Vital signs in last 24 hours: [min-max] current  Temp:  [97.9 F (36.6 C)-98.2 F (36.8 C)] 97.9 F (36.6 C) (03/15 0822) Pulse Rate:  [99-110] 99 (03/15 0323) Resp:  [17-20] 18 (03/15 0822) BP: (97-137)/(69-82) 130/74 (03/15 0822) SpO2:  [92 %-100 %] 100 % (03/15 0822) Weight:  [33.6 kg] 33.6 kg (03/15 0500)     Height: 4\' 11"  (149.9 cm) Weight: 33.6 kg BMI (Calculated): 14.95   Intake/Output last 2 shifts:  03/14 0701 - 03/15 0700 In: 2405.5 [I.V.:599.7; NG/GT:1505.8; IV Piggyback:300] Out: 700 [Urine:100; Emesis/NG output:600]   Physical Exam:  Constitutional: alert, cooperative and no distress  HEENT: NGT in place; bilious but dilute Respiratory: breathing non-labored at rest  Cardiovascular: regular rate and sinus rhythm  Gastrointestinal: soft, incisional soreness, and non-distended. No rebound/guarding. Jejunostomy tube in left abdomen;  Integumentary: Laparotomy is CDI with staples, no erythema   Labs:     Latest Ref Rng & Units 05/15/2023    5:50 AM 05/13/2023    6:13 AM 03/30/2023    3:17 AM  CBC  WBC 4.0 - 10.5 K/uL 8.7  11.0  9.7   Hemoglobin 12.0 - 15.0 g/dL 08.6  57.8  46.9   Hematocrit 36.0 - 46.0 % 33.3  32.0  40.1   Platelets 150 - 400 K/uL 200  205  348       Latest Ref Rng & Units 05/21/2023    5:58 AM 05/20/2023    4:31 AM 05/19/2023    5:37 AM  CMP  Glucose 70 - 99 mg/dL 629  528  413   BUN 6 - 20 mg/dL 9  8  <5   Creatinine 2.44 - 1.00 mg/dL 0.10  2.72  5.36   Sodium 135 - 145 mmol/L 134  138  139   Potassium 3.5 - 5.1 mmol/L 3.7  4.1  4.3    Chloride 98 - 111 mmol/L 100  101  104   CO2 22 - 32 mmol/L 29  30  29    Calcium 8.9 - 10.3 mg/dL 7.5  7.7  7.6      Imaging studies:   No new imaging today   Assessment/Plan:  58 y.o. female 9 Days Post-Op s/p exploratory laparotomy, lysis of adhesions, resection of previous gastrojejunostomy and jejunojejunostomy, creation of new Roux-en-Y gastrojejunostomy, new feeding jejunostomy.   - Keep NGT over the weekend; LIS; monitor and record output  - On Monday, we can start to work towards discharge plan  - We we will continue jejunostomy tube feedings  - Monitor abdominal examination - Pain control prn; antiemetics prn   - Mobilize as feasible; therapies on board   All of the above findings and recommendations were discussed with the patient, and the medical team, and all of patient's questions were answered to her expressed satisfaction.  --Campbell Lerner, M.D., [facility]  05/21/2023 ; 12:03 PM

## 2023-05-21 NOTE — Progress Notes (Signed)
 PHARMACY CONSULT NOTE - ELECTROLYTES  Pharmacy Consult for Electrolyte Monitoring and Replacement   Recent Labs: Height: 4\' 11"  (149.9 cm) Weight: 33.6 kg (74 lb 1.2 oz) IBW/kg (Calculated) : 43.2 Estimated Creatinine Clearance: 40.7 mL/min (by C-G formula based on SCr of 0.61 mg/dL). Potassium (mmol/L)  Date Value  05/21/2023 3.7  04/08/2014 3.8   Magnesium (mg/dL)  Date Value  40/98/1191 2.1   Calcium (mg/dL)  Date Value  47/82/9562 7.5 (L)   Calcium, Total (mg/dL)  Date Value  13/10/6576 9.2   Albumin (g/dL)  Date Value  46/96/2952 1.7 (L)  02/09/2019 3.6 (L)  04/08/2014 4.0   Phosphorus (mg/dL)  Date Value  84/13/2440 4.1   Sodium (mmol/L)  Date Value  05/21/2023 134 (L)  02/09/2019 138  04/08/2014 140    Corrected Ca:  9.5  mg/dL        (Ca 7.7   albumin 1.8)  Assessment  Tanya Nguyen is a 58 y.o. female s/p exploratory laparotomy, lysis of adhesions, resection of previous gastrojejunostomy and jejunojejunostomy, creation of new Roux-en-Y gastrojejunostomy, new feeding jejunostomy. PMH significant for GERD, hypothyroidism, depression, PTSD, Bipolar I disorder, chronic low back pain, and electrolyte imbalance. Pharmacy has been consulted to monitor and replace electrolytes.  Diet: Tube feeds @50  ml/hr,   NPO sips w/ meds MIVF: none Pertinent medications:    Free water 30 ml per tube q4h  Goal of Therapy: Electrolytes WNL  Plan:  No replacement indicated today Renal panel with AM labs  Clark Cuff Rodriguez-Guzman PharmD, BCPS 05/21/2023 7:41 AM

## 2023-05-22 LAB — RENAL FUNCTION PANEL
Albumin: 1.7 g/dL — ABNORMAL LOW (ref 3.5–5.0)
Anion gap: 5 (ref 5–15)
BUN: 10 mg/dL (ref 6–20)
CO2: 34 mmol/L — ABNORMAL HIGH (ref 22–32)
Calcium: 7.3 mg/dL — ABNORMAL LOW (ref 8.9–10.3)
Chloride: 97 mmol/L — ABNORMAL LOW (ref 98–111)
Creatinine, Ser: 0.63 mg/dL (ref 0.44–1.00)
GFR, Estimated: >60 mL/min (ref >60)
Glucose, Bld: 106 mg/dL — ABNORMAL HIGH (ref 70–99)
Phosphorus: 3.7 mg/dL (ref 2.5–4.6)
Potassium: 3.6 mmol/L (ref 3.5–5.1)
Sodium: 136 mmol/L (ref 135–145)

## 2023-05-22 MED ORDER — HYDROMORPHONE HCL 1 MG/ML IJ SOLN
0.5000 mg | INTRAMUSCULAR | Status: DC | PRN
  Administered 2023-05-22 – 2023-05-27 (×22): 0.5 mg via INTRAVENOUS
  Filled 2023-05-22 (×22): qty 0.5

## 2023-05-22 NOTE — Plan of Care (Signed)

## 2023-05-22 NOTE — Progress Notes (Signed)
 [facility] SURGICAL PROGRESS NOTE  Hospital Day(s): 10.   Post op day(s): 10 Days Post-Op.   Interval History:  Patient seen and examined No acute events overnight She is doing well considering NGT with 400 ccs; bilious appearing and relatively more concentrated this afternoon. Albumin 1.8 Tube feeds resumed and tolerating well She remains NPO She is having bowel function;     Vital signs in last 24 hours: [min-max] current  Temp:  [97.8 F (36.6 C)-98.9 F (37.2 C)] 97.8 F (36.6 C) (03/16 1352) Pulse Rate:  [88-103] 93 (03/16 1352) Resp:  [17-18] 18 (03/16 1352) BP: (109-122)/(73-78) 113/75 (03/16 1352) SpO2:  [94 %-100 %] 94 % (03/16 1352) Weight:  [34.3 kg] 34.3 kg (03/16 0409)     Height: 4\' 11"  (149.9 cm) Weight: 34.3 kg BMI (Calculated): 15.26   Intake/Output last 2 shifts:  03/15 0701 - 03/16 0700 In: 530 [NG/GT:430; IV Piggyback:100] Out: 400 [Emesis/NG output:400]   Physical Exam:  Constitutional: alert, cooperative and no distress  HEENT: NGT in place; bilious Respiratory: breathing non-labored at rest  Cardiovascular: regular rate and sinus rhythm  Gastrointestinal: soft, incisional soreness, and non-distended. No rebound/guarding. Jejunostomy tube in left abdomen;  Integumentary: Laparotomy is CDI with staples, no erythema   Labs:     Latest Ref Rng & Units 05/15/2023    5:50 AM 05/13/2023    6:13 AM 03/30/2023    3:17 AM  CBC  WBC 4.0 - 10.5 K/uL 8.7  11.0  9.7   Hemoglobin 12.0 - 15.0 g/dL 56.2  13.0  86.5   Hematocrit 36.0 - 46.0 % 33.3  32.0  40.1   Platelets 150 - 400 K/uL 200  205  348       Latest Ref Rng & Units 05/22/2023    5:29 AM 05/21/2023    5:58 AM 05/20/2023    4:31 AM  CMP  Glucose 70 - 99 mg/dL 784  696  295   BUN 6 - 20 mg/dL 10  9  8    Creatinine 0.44 - 1.00 mg/dL 2.84  1.32  4.40   Sodium 135 - 145 mmol/L 136  134  138   Potassium 3.5 - 5.1 mmol/L 3.6  3.7  4.1   Chloride 98 - 111 mmol/L 97  100  101   CO2  22 - 32 mmol/L 34  29  30   Calcium 8.9 - 10.3 mg/dL 7.3  7.5  7.7      Imaging studies:   No new imaging today   Assessment/Plan:  58 y.o. female 10 Days Post-Op s/p exploratory laparotomy, lysis of adhesions, resection of previous gastrojejunostomy and jejunojejunostomy, creation of new Roux-en-Y gastrojejunostomy, new feeding jejunostomy.   - Will trial NGT clamping, resume LIS as needed.   - On Monday, we can start to work towards discharge plan  - We we will continue jejunostomy tube feedings  - Monitor abdominal examination - Pain control prn; antiemetics prn   - Mobilize as feasible; therapies on board   All of the above findings and recommendations were discussed with the patient, and the medical team, and all of patient's questions were answered to her expressed satisfaction.  --Campbell Lerner, M.D., [facility]  05/22/2023 ; 3:24 PM

## 2023-05-22 NOTE — Progress Notes (Signed)
 PHARMACY CONSULT NOTE - ELECTROLYTES  Pharmacy Consult for Electrolyte Monitoring and Replacement   Recent Labs: Height: 4\' 11"  (149.9 cm) Weight: 34.3 kg (75 lb 9.9 oz) IBW/kg (Calculated) : 43.2 Estimated Creatinine Clearance: 41.5 mL/min (by C-G formula based on SCr of 0.63 mg/dL). Potassium (mmol/L)  Date Value  05/22/2023 3.6  04/08/2014 3.8   Magnesium (mg/dL)  Date Value  78/29/5621 2.1   Calcium (mg/dL)  Date Value  30/86/5784 7.3 (L)   Calcium, Total (mg/dL)  Date Value  69/62/9528 9.2   Albumin (g/dL)  Date Value  41/32/4401 1.7 (L)  02/09/2019 3.6 (L)  04/08/2014 4.0   Phosphorus (mg/dL)  Date Value  02/72/5366 3.7   Sodium (mmol/L)  Date Value  05/22/2023 136  02/09/2019 138  04/08/2014 140    Corrected Ca:  9.14  mg/dL        (Ca 7.3   albumin 1.7)  Assessment  Tanya Nguyen is a 58 y.o. female s/p exploratory laparotomy, lysis of adhesions, resection of previous gastrojejunostomy and jejunojejunostomy, creation of new Roux-en-Y gastrojejunostomy, new feeding jejunostomy. PMH significant for GERD, hypothyroidism, depression, PTSD, Bipolar I disorder, chronic low back pain, and electrolyte imbalance. Pharmacy has been consulted to monitor and replace electrolytes.  Diet: Tube feeds @50  ml/hr,   NPO sips w/ meds MIVF: none Pertinent medications:    Free water 30 ml per tube q4h  Goal of Therapy: Electrolytes WNL  Plan:  No replacement indicated today Renal panel with AM labs  Tanya Nguyen, PharmD Clinical Pharmacist 05/22/2023 7:45 AM

## 2023-05-22 NOTE — Plan of Care (Signed)
  Problem: Clinical Measurements: Goal: Will remain free from infection Outcome: Progressing   Problem: Activity: Goal: Risk for activity intolerance will decrease Outcome: Progressing   Problem: Coping: Goal: Level of anxiety will decrease Outcome: Progressing   Problem: Elimination: Goal: Will not experience complications related to bowel motility Outcome: Progressing Goal: Will not experience complications related to urinary retention Outcome: Progressing   Problem: Pain Managment: Goal: General experience of comfort will improve and/or be controlled Outcome: Progressing

## 2023-05-22 NOTE — Progress Notes (Signed)
 Suction reattached after 6 hours 250cc noted in canister, reattached to LIS as ordered

## 2023-05-22 NOTE — Progress Notes (Signed)
 Physical Therapy Treatment Patient Details Name: Tanya Nguyen MRN: [id number] DOB: 1965/08/02 Today's Date: 05/22/2023   History of Present Illness Pt admitted for gastric outlet obstruction and is s/p exploratory laparotomy, lysis of adhesions, resection of previous gastrojejunostomy and jejunojejunostomy, creation of new Roux-en-Y gastrojejunostomy, new feeding jejunostomy. NGT placed 3/10. PMH significant for GERD, hypothyroidism, depression, PTSD, Bipolar I disorder, chronic low back pain, and electrolyte imbalance.    PT Comments  OOB and completes x 2 laps with RW and cga x 1.  Opts to return to bed stating chair pulls on her stomach and is uncomfortable.  Progressing well with mobility and motivated.   If plan is discharge home, recommend the following: A little help with walking and/or transfers;A little help with bathing/dressing/bathroom;Assistance with cooking/housework;Assist for transportation;Help with stairs or ramp for entrance   Can travel by private vehicle        Equipment Recommendations  BSC/3in1;Rolling walker (2 wheels)    Recommendations for Other Services       Precautions / Restrictions Precautions Precautions: Fall Recall of Precautions/Restrictions: Intact Restrictions Weight Bearing Restrictions Per Provider Order: No     Mobility  Bed Mobility Overal bed mobility: Needs Assistance Bed Mobility: Supine to Sit, Sit to Supine     Supine to sit: Supervision Sit to supine: Supervision     Patient Response: Cooperative  Transfers Overall transfer level: Needs assistance Equipment used: Rolling walker (2 wheels) Transfers: Sit to/from Stand Sit to Stand: Contact guard assist, Min assist                Ambulation/Gait Ambulation/Gait assistance: Contact guard assist Gait Distance (Feet): 320 Feet Assistive device: Rolling walker (2 wheels) Gait Pattern/deviations: Step-through pattern, Decreased step length - right, Decreased step length  - left Gait velocity: decreased         Stairs             Wheelchair Mobility     Tilt Bed Tilt Bed Patient Response: Cooperative  Modified Rankin (Stroke Patients Only)       Balance Overall balance assessment: Needs assistance, History of Falls Sitting-balance support: Feet supported, No upper extremity supported Sitting balance-Leahy Scale: Good     Standing balance support: Bilateral upper extremity supported Standing balance-Leahy Scale: Fair                              Hotel manager: No apparent difficulties  Cognition Arousal: Alert Behavior During Therapy: WFL for tasks assessed/performed   PT - Cognitive impairments: No apparent impairments                         Following commands: Intact      Cueing Cueing Techniques: Verbal cues  Exercises      General Comments        Pertinent Vitals/Pain Pain Assessment Pain Assessment: Faces Faces Pain Scale: Hurts a little bit Pain Location: stomach Pain Descriptors / Indicators: Discomfort, Grimacing Pain Intervention(s): Limited activity within patient's tolerance, Monitored during session, Repositioned    Home Living                          Prior Function            PT Goals (current goals can now be found in the care plan section) Progress towards PT goals: Progressing toward goals    Frequency  Min 3X/week      PT Plan      Co-evaluation              AM-PAC PT "6 Clicks" Mobility   Outcome Measure  Help needed turning from your back to your side while in a flat bed without using bedrails?: None Help needed moving from lying on your back to sitting on the side of a flat bed without using bedrails?: None Help needed moving to and from a bed to a chair (including a wheelchair)?: A Little Help needed standing up from a chair using your arms (e.g., wheelchair or bedside chair)?: A Little Help needed to  walk in hospital room?: A Little Help needed climbing 3-5 steps with a railing? : A Little 6 Click Score: 20    End of Session   Activity Tolerance: Patient tolerated treatment well Patient left: in bed;with call bell/phone within reach;with bed alarm set Nurse Communication: Mobility status PT Visit Diagnosis: Unsteadiness on feet (R26.81);Muscle weakness (generalized) (M62.81);History of falling (Z91.81);Difficulty in walking, not elsewhere classified (R26.2);Pain;Other abnormalities of gait and mobility (R26.89)     Time: 1114-1130 PT Time Calculation (min) (ACUTE ONLY): 16 min  Charges:    $Gait Training: 8-22 mins PT General Charges $$ ACUTE PT VISIT: 1 Visit                   Danielle Dess, PTA 05/22/23, 12:52 PM

## 2023-05-23 LAB — BASIC METABOLIC PANEL
Anion gap: 9 (ref 5–15)
BUN: 10 mg/dL (ref 6–20)
CO2: 33 mmol/L — ABNORMAL HIGH (ref 22–32)
Calcium: 7.8 mg/dL — ABNORMAL LOW (ref 8.9–10.3)
Chloride: 96 mmol/L — ABNORMAL LOW (ref 98–111)
Creatinine, Ser: 0.61 mg/dL (ref 0.44–1.00)
GFR, Estimated: >60 mL/min (ref >60)
Glucose, Bld: 109 mg/dL — ABNORMAL HIGH (ref 70–99)
Potassium: 4.1 mmol/L (ref 3.5–5.1)
Sodium: 138 mmol/L (ref 135–145)

## 2023-05-23 LAB — PHOSPHORUS: Phosphorus: 3.7 mg/dL (ref 2.5–4.6)

## 2023-05-23 LAB — MAGNESIUM: Magnesium: 2 mg/dL (ref 1.7–2.4)

## 2023-05-23 NOTE — Progress Notes (Signed)
 Mobility Specialist - Progress Note   05/23/23 1021  Mobility  Activity Ambulated with assistance in hallway;Ambulated with assistance in room  Level of Assistance Standby assist, set-up cues, supervision of patient - no hands on  Assistive Device Front wheel walker  Distance Ambulated (ft) 160 ft  Activity Response Tolerated well  Mobility visit 1 Mobility  Mobility Specialist Start Time (ACUTE ONLY) [id number]  Mobility Specialist Stop Time (ACUTE ONLY) 1006  Mobility Specialist Time Calculation (min) (ACUTE ONLY) 13 min   Pt in fowler position upon entry, utilizing RA. Pt agreeable to OOB amb this date, expressing little pain as RN just gave Pt meds ~28mins prior. Pt completed bed mob indep, STS to RW MinG and amb one lap around the NS with supervision-- slow and steady gait. Pt returned to the room, left seated on the [facility] with call bell within reach. RN notified.  Zetta Bills Mobility Specialist 05/23/23 10:26 AM

## 2023-05-23 NOTE — Progress Notes (Signed)
 Physical Therapy Treatment Patient Details Name: Tanya Nguyen MRN: [id number] DOB: 10/27/1965 Today's Date: 05/23/2023   History of Present Illness Pt admitted for gastric outlet obstruction and is s/p exploratory laparotomy, lysis of adhesions, resection of previous gastrojejunostomy and jejunojejunostomy, creation of new Roux-en-Y gastrojejunostomy, new feeding jejunostomy. NGT placed 3/10. PMH significant for GERD, hypothyroidism, depression, PTSD, Bipolar I disorder, chronic low back pain, and electrolyte imbalance.    PT Comments  In and out of bed  Mod I.  BSC at bedside and she stated she is getting to/from on her own during the day/night.  She is able to walk x 2 laps on unit with RW and slow steady gait.  Overall remains motivated to progress mobility and independence.     If plan is discharge home, recommend the following: A little help with walking and/or transfers;A little help with bathing/dressing/bathroom;Assistance with cooking/housework;Assist for transportation;Help with stairs or ramp for entrance   Can travel by private vehicle        Equipment Recommendations  BSC/3in1;Rolling walker (2 wheels)    Recommendations for Other Services       Precautions / Restrictions Precautions Precautions: Fall Recall of Precautions/Restrictions: Intact Restrictions Weight Bearing Restrictions Per Provider Order: No     Mobility  Bed Mobility Overal bed mobility: Needs Assistance Bed Mobility: Supine to Sit, Sit to Supine Rolling: Modified independent (Device/Increase time)     Sit to supine: Modified independent (Device/Increase time)   General bed mobility comments: increased time required to perform. Patient Response: Cooperative  Transfers Overall transfer level: Needs assistance Equipment used: Rolling walker (2 wheels) Transfers: Sit to/from Stand Sit to Stand: Contact guard assist, Min assist                Ambulation/Gait Ambulation/Gait assistance:  Contact guard assist Gait Distance (Feet): 320 Feet Assistive device: Rolling walker (2 wheels) Gait Pattern/deviations: Step-through pattern, Decreased step length - right, Decreased step length - left Gait velocity: decreased         Stairs             Wheelchair Mobility     Tilt Bed Tilt Bed Patient Response: Cooperative  Modified Rankin (Stroke Patients Only)       Balance Overall balance assessment: Needs assistance, History of Falls Sitting-balance support: Feet supported, No upper extremity supported Sitting balance-Leahy Scale: Good     Standing balance support: Bilateral upper extremity supported Standing balance-Leahy Scale: Fair                              Hotel manager: No apparent difficulties  Cognition Arousal: Alert Behavior During Therapy: WFL for tasks assessed/performed   PT - Cognitive impairments: No apparent impairments                         Following commands: Intact      Cueing Cueing Techniques: Verbal cues  Exercises      General Comments        Pertinent Vitals/Pain Pain Assessment Pain Assessment: Faces Faces Pain Scale: Hurts little more Pain Location: stomach Pain Descriptors / Indicators: Discomfort, Grimacing Pain Intervention(s): Limited activity within patient's tolerance, Monitored during session, Repositioned    Home Living                          Prior Function  PT Goals (current goals can now be found in the care plan section) Progress towards PT goals: Progressing toward goals    Frequency    Min 3X/week      PT Plan      Co-evaluation              AM-PAC PT "6 Clicks" Mobility   Outcome Measure  Help needed turning from your back to your side while in a flat bed without using bedrails?: None Help needed moving from lying on your back to sitting on the side of a flat bed without using bedrails?: None Help  needed moving to and from a bed to a chair (including a wheelchair)?: None Help needed standing up from a chair using your arms (e.g., wheelchair or bedside chair)?: None Help needed to walk in hospital room?: A Little Help needed climbing 3-5 steps with a railing? : A Little 6 Click Score: 22    End of Session   Activity Tolerance: Patient tolerated treatment well Patient left: in bed;with call bell/phone within reach Nurse Communication: Mobility status PT Visit Diagnosis: Unsteadiness on feet (R26.81);Muscle weakness (generalized) (M62.81);History of falling (Z91.81);Difficulty in walking, not elsewhere classified (R26.2);Pain;Other abnormalities of gait and mobility (R26.89)     Time: 4098-1191 PT Time Calculation (min) (ACUTE ONLY): 16 min  Charges:    $Gait Training: 8-22 mins PT General Charges $$ ACUTE PT VISIT: 1 Visit                   Danielle Dess, PTA 05/23/23, 2:27 PM

## 2023-05-23 NOTE — Plan of Care (Signed)

## 2023-05-23 NOTE — Plan of Care (Signed)
   Problem: Activity: Goal: Risk for activity intolerance will decrease Outcome: Progressing   Problem: Nutrition: Goal: Adequate nutrition will be maintained Outcome: Progressing   Problem: Coping: Goal: Level of anxiety will decrease Outcome: Progressing   Problem: Elimination: Goal: Will not experience complications related to bowel motility Outcome: Progressing Goal: Will not experience complications related to urinary retention Outcome: Progressing

## 2023-05-23 NOTE — Progress Notes (Signed)
 Occupational Therapy Treatment Patient Details Name: Tanya Nguyen MRN: [id number] DOB: June 29, 1965 Today's Date: 05/23/2023   History of present illness Pt admitted for gastric outlet obstruction and is s/p exploratory laparotomy, lysis of adhesions, resection of previous gastrojejunostomy and jejunojejunostomy, creation of new Roux-en-Y gastrojejunostomy, new feeding jejunostomy. NGT placed 3/10. PMH significant for GERD, hypothyroidism, depression, PTSD, Bipolar I disorder, chronic low back pain, and electrolyte imbalance.   OT comments  Pt seen for OT treatment on this date. Upon arrival to room pt seated in bed with husband at bedside, agreeable to tx. Pt completed bed mobility; MODI no physical assistance required. Pt completed EOB oral care MODI, proceded to complete sink level bathing with CGA and RW close to use PRN. MINA needed for donning/doffing gown due to line and leads. Pt making good progress toward goals, will continue to follow POC. Discharge recommendation remains appropriate.        If plan is discharge home, recommend the following:  A little help with walking and/or transfers;A little help with bathing/dressing/bathroom;Assistance with cooking/housework;Assist for transportation;Help with stairs or ramp for entrance   Equipment Recommendations  BSC/3in1    Recommendations for Other Services      Precautions / Restrictions Precautions Precautions: Fall Recall of Precautions/Restrictions: Intact Restrictions Weight Bearing Restrictions Per Provider Order: No       Mobility Bed Mobility Overal bed mobility: Needs Assistance Bed Mobility: Supine to Sit, Sit to Supine Rolling: Modified independent (Device/Increase time)         General bed mobility comments: No physical assistance required    Transfers Overall transfer level: Needs assistance Equipment used: Rolling walker (2 wheels) Transfers: Sit to/from Stand Sit to Stand: Contact guard assist                  Balance Overall balance assessment: Needs assistance, History of Falls Sitting-balance support: Feet supported, No upper extremity supported Sitting balance-Leahy Scale: Good Sitting balance - Comments: Good dynamic sitting during grooming tasks   Standing balance support: Bilateral upper extremity supported Standing balance-Leahy Scale: Fair                             ADL either performed or assessed with clinical judgement   ADL Overall ADL's : Needs assistance/impaired Eating/Feeding: NPO   Grooming: Wash/dry hands;Sitting;Wash/dry face;Oral care   Upper Body Bathing: Sitting;Set up   Lower Body Bathing: Sitting/lateral leans;Set up   Upper Body Dressing : Standing;Minimal assistance (MINA due to lines and leads)                   Functional mobility during ADLs: Contact guard assist General ADL Comments: Pt completed EOB oral care MODI, proceded to complete sink level bathing with CGA and RW close to use PRN. MINA needed for donning/doffing gown due to line and leads.    Extremity/Trunk Assessment              Vision       Restaurant manager, fast food Communication: No apparent difficulties   Cognition Arousal: Alert Behavior During Therapy: WFL for tasks assessed/performed Cognition: No apparent impairments             OT - Cognition Comments: Pt seemly alert and excited to get clean                 Following commands: Intact  Cueing   Cueing Techniques: Verbal cues  Exercises      Shoulder Instructions       General Comments Pt tolerated sink level bathing for ~60mins standing with no breaks    Pertinent Vitals/ Pain       Pain Assessment Pain Assessment: 0-10 Pain Score: 4  Pain Location: stomach Pain Descriptors / Indicators: Discomfort, Grimacing Pain Intervention(s): Limited activity within patient's tolerance, Monitored during session, Repositioned  Home  Living                                          Prior Functioning/Environment              Frequency  Min 2X/week        Progress Toward Goals  OT Goals(current goals can now be found in the care plan section)  Progress towards OT goals: Progressing toward goals  Acute Rehab OT Goals Patient Stated Goal: to go home an decrease pain OT Goal Formulation: With patient Time For Goal Achievement: 05/28/23 Potential to Achieve Goals: Fair ADL Goals Pt Will Perform Grooming: with modified independence;standing Pt Will Perform Lower Body Dressing: with modified independence;sit to/from stand Pt Will Transfer to Toilet: with modified independence;ambulating Pt Will Perform Toileting - Clothing Manipulation and hygiene: with modified independence;sit to/from stand  Plan      Co-evaluation                 AM-PAC OT "6 Clicks" Daily Activity     Outcome Measure   Help from another person eating meals?: None Help from another person taking care of personal grooming?: A Little Help from another person toileting, which includes using toliet, bedpan, or urinal?: A Little Help from another person bathing (including washing, rinsing, drying)?: A Little Help from another person to put on and taking off regular upper body clothing?: None Help from another person to put on and taking off regular lower body clothing?: A Little 6 Click Score: 20    End of Session Equipment Utilized During Treatment: Rolling walker (2 wheels)  OT Visit Diagnosis: Unsteadiness on feet (R26.81);Repeated falls (R29.6);Muscle weakness (generalized) (M62.81)   Activity Tolerance Patient tolerated treatment well   Patient Left with call bell/phone within reach;in chair;with chair alarm set   Nurse Communication Mobility status        Time: 1420-1440 OT Time Calculation (min): 20 min  Charges: OT General Charges $OT Visit: 1 Visit OT Treatments $Self Care/Home Management :  8-22 mins  Glenard Haring M.S. OTR/L  05/23/23, 3:36 PM

## 2023-05-23 NOTE — Progress Notes (Signed)
 [facility] SURGICAL PROGRESS NOTE  Hospital Day(s): 11.   Post op day(s): 11 Days Post-Op.   Interval History:  Patient seen and examined No acute events overnight No fever, chills, nausea, emesis NGT with 1250 ccs; bilious appearing but dilute  Renal function normal; sCr - 0.61; UO - unmeasured No electrolyte derangements Tube feeds running; 50 ml/hr She is NPO She is having bowel function   Vital signs in last 24 hours: [min-max] current  Temp:  [97.5 F (36.4 C)-98 F (36.7 C)] 97.5 F (36.4 C) (03/17 0805) Pulse Rate:  [93-105] 103 (03/17 0805) Resp:  [14-20] 14 (03/17 0805) BP: (113-136)/(71-91) 115/77 (03/17 0805) SpO2:  [94 %-99 %] 99 % (03/17 0805)     Height: 4\' 11"  (149.9 cm) Weight: 34.3 kg BMI (Calculated): 15.26   Intake/Output last 2 shifts:  03/16 0701 - 03/17 0700 In: -  Out: 650 [Emesis/NG output:650]   Physical Exam:  Constitutional: alert, cooperative and no distress  HEENT: NGT in place Respiratory: breathing non-labored at rest  Cardiovascular: regular rate and sinus rhythm  Gastrointestinal: soft, incisional soreness, and non-distended. No rebound/guarding. Jejunostomy tube in left abdomen; tube feeds running  Integumentary: Laparotomy is CDI with staples, no erythema   Labs:     Latest Ref Rng & Units 05/15/2023    5:50 AM 05/13/2023    6:13 AM 03/30/2023    3:17 AM  CBC  WBC 4.0 - 10.5 K/uL 8.7  11.0  9.7   Hemoglobin 12.0 - 15.0 g/dL 10.2  72.5  36.6   Hematocrit 36.0 - 46.0 % 33.3  32.0  40.1   Platelets 150 - 400 K/uL 200  205  348       Latest Ref Rng & Units 05/23/2023    6:04 AM 05/22/2023    5:29 AM 05/21/2023    5:58 AM  CMP  Glucose 70 - 99 mg/dL 440  347  425   BUN 6 - 20 mg/dL 10  10  9    Creatinine 0.44 - 1.00 mg/dL 9.56  3.87  5.64   Sodium 135 - 145 mmol/L 138  136  134   Potassium 3.5 - 5.1 mmol/L 4.1  3.6  3.7   Chloride 98 - 111 mmol/L 96  97  100   CO2 22 - 32 mmol/L 33  34  29   Calcium 8.9 - 10.3  mg/dL 7.8  7.3  7.5      Imaging studies: No new pertinent imaging studies   Assessment/Plan:  58 y.o. female 11 Days Post-Op s/p exploratory laparotomy, lysis of adhesions, resection of previous gastrojejunostomy and jejunojejunostomy, creation of new Roux-en-Y gastrojejunostomy, new feeding jejunostomy.   - Will proceed with NGT clamping trial this morning. Clamp x4 hours and check residuals. We will determine if we can remove this and initiate diet. Alternative if she fails would be considering placement of gastrostomy tube for venting and to allow patient to discharge home with home enteral nutrition.   - Okay to to continue tube feeds at goal  - Monitor abdominal examination - Pain control prn; antiemetics prn   - Mobilize as feasible; therapies on board   - Discharge Planning: Pending clamping trial this morning to begin discharge planning and identifying any additional needs   All of the above findings and recommendations were discussed with the patient, and the medical team, and all of patient's questions were answered to her expressed satisfaction.  -- Lynden Oxford, PA-C [facility] 05/23/2023, 8:12  AM M-F: 7am - 4pm

## 2023-05-23 NOTE — Progress Notes (Signed)
 PHARMACY CONSULT NOTE - ELECTROLYTES  Pharmacy Consult for Electrolyte Monitoring and Replacement   Recent Labs: Height: 4\' 11"  (149.9 cm) Weight: 34.3 kg (75 lb 9.9 oz) IBW/kg (Calculated) : 43.2 Estimated Creatinine Clearance: 41.5 mL/min (by C-G formula based on SCr of 0.61 mg/dL). Potassium (mmol/L)  Date Value  05/23/2023 4.1  04/08/2014 3.8   Magnesium (mg/dL)  Date Value  71/69/6789 2.0   Calcium (mg/dL)  Date Value  38/12/1749 7.8 (L)   Calcium, Total (mg/dL)  Date Value  02/58/5277 9.2   Albumin (g/dL)  Date Value  82/42/3536 1.7 (L)  02/09/2019 3.6 (L)  04/08/2014 4.0   Phosphorus (mg/dL)  Date Value  14/43/1540 3.7   Sodium (mmol/L)  Date Value  05/23/2023 138  02/09/2019 138  04/08/2014 140    Corrected Ca:  9.6 mg/dL        (Ca 7.8   albumin 1.7)  Assessment  Tanya Nguyen is a 58 y.o. female s/p exploratory laparotomy, lysis of adhesions, resection of previous gastrojejunostomy and jejunojejunostomy, creation of new Roux-en-Y gastrojejunostomy, new feeding jejunostomy. PMH significant for GERD, hypothyroidism, depression, PTSD, Bipolar I disorder, chronic low back pain, and electrolyte imbalance. Pharmacy has been consulted to monitor and replace electrolytes.  Diet: Tube feeds @50  ml/hr,   NPO sips w/ meds MIVF: none Pertinent medications:    Free water 30 ml per tube q4h  Goal of Therapy: Electrolytes WNL  Plan:  No replacement indicated today Recheck electrolytes with AM labs  Barrie Folk, PharmD Clinical Pharmacist 05/23/2023 7:44 AM

## 2023-05-24 LAB — BASIC METABOLIC PANEL
Anion gap: 6 (ref 5–15)
BUN: 12 mg/dL (ref 6–20)
CO2: 32 mmol/L (ref 22–32)
Calcium: 7.7 mg/dL — ABNORMAL LOW (ref 8.9–10.3)
Chloride: 97 mmol/L — ABNORMAL LOW (ref 98–111)
Creatinine, Ser: 0.66 mg/dL (ref 0.44–1.00)
GFR, Estimated: >60 mL/min (ref >60)
Glucose, Bld: 97 mg/dL (ref 70–99)
Potassium: 4 mmol/L (ref 3.5–5.1)
Sodium: 135 mmol/L (ref 135–145)

## 2023-05-24 LAB — CBC
HCT: 30.5 % — ABNORMAL LOW (ref 36.0–46.0)
Hemoglobin: 10.7 g/dL — ABNORMAL LOW (ref 12.0–15.0)
MCH: 30.7 pg (ref 26.0–34.0)
MCHC: 35.1 g/dL (ref 30.0–36.0)
MCV: 87.6 fL (ref 80.0–100.0)
Platelets: 637 10*3/uL — ABNORMAL HIGH (ref 150–400)
RBC: 3.48 MIL/uL — ABNORMAL LOW (ref 3.87–5.11)
RDW: 18.2 % — ABNORMAL HIGH (ref 11.5–15.5)
WBC: 16.9 10*3/uL — ABNORMAL HIGH (ref 4.0–10.5)
nRBC: 0 % (ref 0.0–0.2)

## 2023-05-24 MED ORDER — ZINC SULFATE 220 (50 ZN) MG PO CAPS
220.0000 mg | ORAL_CAPSULE | Freq: Every day | ORAL | Status: DC
  Administered 2023-05-25 – 2023-05-27 (×3): 220 mg via ORAL
  Filled 2023-05-24 (×3): qty 1

## 2023-05-24 MED ORDER — VITAMIN A 3 MG (10000 UNIT) PO CAPS
10000.0000 [IU] | ORAL_CAPSULE | Freq: Every day | ORAL | Status: DC
Start: 2023-05-25 — End: 2023-05-24

## 2023-05-24 MED ORDER — OSMOLITE 1.2 CAL PO LIQD
1190.0000 mL | ORAL | Status: DC
  Administered 2023-05-25 – 2023-05-26 (×2): 1190 mL

## 2023-05-24 MED ORDER — FREE WATER
30.0000 mL | Status: DC
  Administered 2023-05-24 – 2023-05-27 (×20): 30 mL

## 2023-05-24 MED ORDER — PHENOL 1.4 % MT LIQD: 1.0000 | OROMUCOSAL | Status: DC | PRN

## 2023-05-24 MED ORDER — MENTHOL 3 MG MT LOZG: 1.0000 | LOZENGE | OROMUCOSAL | Status: DC | PRN

## 2023-05-24 MED ORDER — OSMOLITE 1.2 CAL PO LIQD
910.0000 mL | ORAL | Status: AC
  Administered 2023-05-24: 910 mL

## 2023-05-24 MED ORDER — VITAMIN A 3 MG (10000 UNIT) PO CAPS
10000.0000 [IU] | ORAL_CAPSULE | Freq: Every day | ORAL | Status: DC
  Administered 2023-05-25 – 2023-05-27 (×3): 10000 [IU] via ORAL
  Filled 2023-05-24 (×3): qty 1

## 2023-05-24 NOTE — Plan of Care (Signed)

## 2023-05-24 NOTE — Progress Notes (Signed)
 PHARMACY CONSULT NOTE - ELECTROLYTES  Pharmacy Consult for Electrolyte Monitoring and Replacement   Recent Labs: Height: 4\' 11"  (149.9 cm) Weight: 31.9 kg (70 lb 5.2 oz) IBW/kg (Calculated) : 43.2 Estimated Creatinine Clearance: 38.6 mL/min (by C-G formula based on SCr of 0.66 mg/dL). Potassium (mmol/L)  Date Value  05/24/2023 4.0  04/08/2014 3.8   Magnesium (mg/dL)  Date Value  91/47/8295 2.0   Calcium (mg/dL)  Date Value  62/13/0865 7.7 (L)   Calcium, Total (mg/dL)  Date Value  78/46/9629 9.2   Albumin (g/dL)  Date Value  52/84/1324 1.7 (L)  02/09/2019 3.6 (L)  04/08/2014 4.0   Phosphorus (mg/dL)  Date Value  40/12/2723 3.7   Sodium (mmol/L)  Date Value  05/24/2023 135  02/09/2019 138  04/08/2014 140    Corrected Ca:  9.6 mg/dL        (Ca 7.8   albumin 1.7)  Assessment  Tanya Nguyen is a 58 y.o. female s/p exploratory laparotomy, lysis of adhesions, resection of previous gastrojejunostomy and jejunojejunostomy, creation of new Roux-en-Y gastrojejunostomy, new feeding jejunostomy. PMH significant for GERD, hypothyroidism, depression, PTSD, Bipolar I disorder, chronic low back pain, and electrolyte imbalance. Pharmacy has been consulted to monitor and replace electrolytes.  Diet: Tube feeds @50  ml/hr,   NPO sips w/ meds MIVF: none Pertinent medications:    Free water 30 ml per tube q4h  Goal of Therapy: Electrolytes WNL  Plan:  No replacement indicated today Recheck electrolytes with AM labs  Barrie Folk, PharmD Clinical Pharmacist 05/24/2023 7:46 AM

## 2023-05-24 NOTE — TOC Progression Note (Signed)
 Transition of Care [facility]) - Progression Note    Patient Details  Name: Tanya Nguyen MRN: [id number] Date of Birth: 1966-02-13  Transition of Care [facility]) CM/SW Contact  Chapman Fitch, RN Phone Number: 05/24/2023, 3:37 PM  Clinical Narrative:     MD to sign Tube feed forms today, and then will be sent to [facility] with [facility]  Expected Discharge Plan: Home w Home Health Services Barriers to Discharge: Continued Medical Work up  Expected Discharge Plan and Services       Living arrangements for the past 2 months: Single Family Home                 DME Arranged: Tube feeding DME Agency: [employer] Date DME Agency Contacted: 05/20/23   Representative spoke with at DME Agency: Marthann Schiller HH Arranged: RN, PT, OT [facility] Agency: [facility] Date [facility] Agency Contacted: 05/20/23   Representative spoke with at [facility] Agency: Kandee Keen   Social Determinants of Health (SDOH) Interventions SDOH Screenings   Food Insecurity: No Food Insecurity (05/13/2023)  Housing: Low Risk  (05/13/2023)  Transportation Needs: No Transportation Needs (05/13/2023)  Utilities: Not At Risk (05/13/2023)  Depression (PHQ2-9): Medium Risk (03/17/2023)  Social Connections: Socially Integrated (03/30/2023)  Tobacco Use: Low Risk  (05/12/2023)    Readmission Risk Interventions    05/20/2023   12:24 PM  Readmission Risk Prevention Plan  Transportation Screening Complete  PCP or Specialist Appt within 5-7 Days Complete  Home Care Screening Complete  Medication Review (RN CM) Complete

## 2023-05-24 NOTE — Progress Notes (Signed)
 [facility] SURGICAL PROGRESS NOTE  Hospital Day(s): 12.   Post op day(s): 12 Days Post-Op.   Interval History:  Patient seen and examined No acute events overnight Patient has tolerated NGT clamping trial yesterday and overnight Abdominal soreness expectedly No fever, chills, nausea, emesis NGT clamped overnight  Did have leukocytosis this AM; WBC 16.9K Hgb to 10.7 Renal function normal; sCr - 0.66; UO - unmeasured No electrolyte derangements Tube feeds running; 50 ml/hr She is NPO She is having bowel function   Vital signs in last 24 hours: [min-max] current  Temp:  [98 F (36.7 C)-98.5 F (36.9 C)] 98 F (36.7 C) (03/18 0754) Pulse Rate:  [99-109] 106 (03/18 0754) Resp:  [16-18] 18 (03/18 0754) BP: (117-132)/(77-87) 118/77 (03/18 0754) SpO2:  [98 %-99 %] 98 % (03/18 0754) Weight:  [31.9 kg] 31.9 kg (03/18 0500)     Height: 4\' 11"  (149.9 cm) Weight: 31.9 kg BMI (Calculated): 14.2   Intake/Output last 2 shifts:  03/17 0701 - 03/18 0700 In: 50 [NG/GT:50] Out: 101 [Emesis/NG output:100; Stool:1]   Physical Exam:  Constitutional: alert, cooperative and no distress  HEENT: NGT in place; clamped this AM Respiratory: breathing non-labored at rest  Cardiovascular: regular rate and sinus rhythm  Gastrointestinal: soft, incisional soreness, and non-distended. No rebound/guarding. Jejunostomy tube in left abdomen; tube feeds running  Integumentary: Laparotomy is CDI with staples, no erythema   Labs:     Latest Ref Rng & Units 05/24/2023    6:10 AM 05/15/2023    5:50 AM 05/13/2023    6:13 AM  CBC  WBC 4.0 - 10.5 K/uL 16.9  8.7  11.0   Hemoglobin 12.0 - 15.0 g/dL 32.4  40.1  02.7   Hematocrit 36.0 - 46.0 % 30.5  33.3  32.0   Platelets 150 - 400 K/uL 637  200  205       Latest Ref Rng & Units 05/24/2023    6:10 AM 05/23/2023    6:04 AM 05/22/2023    5:29 AM  CMP  Glucose 70 - 99 mg/dL 97  253  664   BUN 6 - 20 mg/dL 12  10  10    Creatinine 0.44 - 1.00  mg/dL 4.03  4.74  2.59   Sodium 135 - 145 mmol/L 135  138  136   Potassium 3.5 - 5.1 mmol/L 4.0  4.1  3.6   Chloride 98 - 111 mmol/L 97  96  97   CO2 22 - 32 mmol/L 32  33  34   Calcium 8.9 - 10.3 mg/dL 7.7  7.8  7.3      Imaging studies: No new pertinent imaging studies   Assessment/Plan:  58 y.o. female 12 Days Post-Op s/p exploratory laparotomy, lysis of adhesions, resection of previous gastrojejunostomy and jejunojejunostomy, creation of new Roux-en-Y gastrojejunostomy, new feeding jejunostomy.   - NGT reconnected to suction this AM to allow for residual check. If residuals are <150 ccs, we can start CLD and monitor tolerance. Ideally we would love to be able to get NGT out prior to discharged  - Again, if she fails to tolerate PO and requires decompression, we will consider gastrostomy tube placement for venting.   - Okay to to continue tube feeds at goal - will likely require these at discharge  - Monitor leukocytosis   - Monitor abdominal examination - Pain control prn; antiemetics prn   - Mobilize as feasible; therapies on board   - Discharge Planning: Pending clamping trial this  morning to begin discharge planning and identifying any additional needs   All of the above findings and recommendations were discussed with the patient, and the medical team, and all of patient's questions were answered to her expressed satisfaction.  -- Lynden Oxford, PA-C [facility] 05/24/2023, 8:44 AM M-F: 7am - 4pm

## 2023-05-24 NOTE — Progress Notes (Signed)
 Physical Therapy Treatment Patient Details Name: Tanya Nguyen MRN: [id number] DOB: 07-Dec-1965 Today's Date: 05/24/2023   History of Present Illness Pt admitted for gastric outlet obstruction and is s/p exploratory laparotomy, lysis of adhesions, resection of previous gastrojejunostomy and jejunojejunostomy, creation of new Roux-en-Y gastrojejunostomy, new feeding jejunostomy. NGT placed 3/10. PMH significant for GERD, hypothyroidism, depression, PTSD, Bipolar I disorder, chronic low back pain, and electrolyte imbalance.    PT Comments  Completes x 2 laps on unit with RW and cga/supervision.  Self selected distance.  Overall seems more confident today with increased speed.   If plan is discharge home, recommend the following: A little help with walking and/or transfers;A little help with bathing/dressing/bathroom;Assistance with cooking/housework;Assist for transportation;Help with stairs or ramp for entrance   Can travel by private vehicle        Equipment Recommendations  BSC/3in1;Rolling walker (2 wheels)    Recommendations for Other Services       Precautions / Restrictions Precautions Precautions: Fall Recall of Precautions/Restrictions: Intact Restrictions Weight Bearing Restrictions Per Provider Order: No     Mobility  Bed Mobility Overal bed mobility: Modified Independent               Patient Response: Cooperative  Transfers Overall transfer level: Modified independent Equipment used: Rolling walker (2 wheels) Transfers: Sit to/from Stand Sit to Stand: Modified independent (Device/Increase time)                Ambulation/Gait Ambulation/Gait assistance: Contact guard assist Gait Distance (Feet): 320 Feet Assistive device: Rolling walker (2 wheels) Gait Pattern/deviations: Step-through pattern, Decreased step length - right, Decreased step length - left       General Gait Details: x 2 laps with increased speed and confidece today   Stairs              Wheelchair Mobility     Tilt Bed Tilt Bed Patient Response: Cooperative  Modified Rankin (Stroke Patients Only)       Balance Overall balance assessment: Needs assistance, History of Falls Sitting-balance support: Feet supported, No upper extremity supported Sitting balance-Leahy Scale: Good     Standing balance support: Bilateral upper extremity supported Standing balance-Leahy Scale: Fair                              Hotel manager: No apparent difficulties  Cognition Arousal: Alert Behavior During Therapy: WFL for tasks assessed/performed   PT - Cognitive impairments: No apparent impairments                         Following commands: Intact      Cueing Cueing Techniques: Verbal cues  Exercises      General Comments        Pertinent Vitals/Pain Pain Assessment Pain Assessment: Faces Pain Score: 4  Pain Location: stomach Pain Descriptors / Indicators: Discomfort, Grimacing Pain Intervention(s): Limited activity within patient's tolerance, Monitored during session, Repositioned    Home Living                          Prior Function            PT Goals (current goals can now be found in the care plan section) Progress towards PT goals: Progressing toward goals    Frequency    Min 3X/week      PT Plan      Co-evaluation  AM-PAC PT "6 Clicks" Mobility   Outcome Measure  Help needed turning from your back to your side while in a flat bed without using bedrails?: None Help needed moving from lying on your back to sitting on the side of a flat bed without using bedrails?: None Help needed moving to and from a bed to a chair (including a wheelchair)?: None Help needed standing up from a chair using your arms (e.g., wheelchair or bedside chair)?: None Help needed to walk in hospital room?: A Little Help needed climbing 3-5 steps with a railing? : A  Little 6 Click Score: 22    End of Session   Activity Tolerance: Patient tolerated treatment well Patient left: with call bell/phone within reach;Other (comment) (on BSC - has been transfering ind on unit to/from) Nurse Communication: Mobility status PT Visit Diagnosis: Unsteadiness on feet (R26.81);Muscle weakness (generalized) (M62.81);History of falling (Z91.81);Difficulty in walking, not elsewhere classified (R26.2);Pain;Other abnormalities of gait and mobility (R26.89)     Time: 1610-9604 PT Time Calculation (min) (ACUTE ONLY): 15 min  Charges:    $Gait Training: 8-22 mins PT General Charges $$ ACUTE PT VISIT: 1 Visit                   Danielle Dess, PTA 05/24/23, 11:01 AM

## 2023-05-24 NOTE — Progress Notes (Signed)
 Nutrition Follow Up Note   DOCUMENTATION CODES:   Severe malnutrition in context of chronic illness  INTERVENTION:   Change to nocturnal tube feeds of Osmolite 1.2 @85ml /hr x 14 hours overnight (from 1800-0800)  Free water flushes 30ml q4 hours to maintain tube patency   Regimen provides 1428kcal/day, 66g/day protein and 1122ml/day of free water.   Copper sulfate 2mg  IV daily x 7 days (completed)  Vitamin A 10,000 units po daily x 30 days   Zinc 220mg  po daily x 30 days   Daily weights   Ensure Enlive po BID with diet advancement, each supplement provides 350 kcal and 20 grams of protein.  NUTRITION DIAGNOSIS:   Severe Malnutrition related to chronic illness as evidenced by moderate fat depletion, severe muscle depletion, 27 percent weight loss in 6 months. -ongoing   GOAL:   Patient will meet greater than or equal to 90% of their needs -met   MONITOR:   PO intake, Labs, Weight trends, TF tolerance, I & O's, Skin  ASSESSMENT:   58 y/o female with h/o chronic pain, GERD, open cholecystectomy, IBS-C, thyroid disease, depression, anxiety, PTSD, bipolar disorder, SI, peripheral neuropathy, IDA, PUD, kindney stones and GOO s/p exploratory laparotomy (with gastrojejunostomy with omega loop/jejunojejunostomy 01/2018) complicated by incisional hernia s/p open incisional hernia repair with mesh underlay 11/2018 and who is now admitted with malfunctioning gastrojejunostomy s/p exploratory laparotomy with lysis of adhesions, resection of previous gastrojejunostomy and jejunojejunostomy, creation of new Roux-en-Y gastrojejunostomy and new feeding jejunostomy tube (47F) 3/6.  -Pt s/p UGI 3/10; concerning for possible contrast extravasation and proximal small bowel dilation    Met with pt in room today. Pt reports that she is doing well but reports some nausea today. Pt sipping on water at the time of RD visit. Pt initiated on clear liquids today. Pt tolerating tube feeds well at goal  rate. Plan is for patient to discharge on nocturnal feeds. Will begin nocturnal feeds tonight to make sure patient is able to tolerate prior to discharge. Refeed labs stable. Pt with numerous vitamin deficiencies; will provide supplementation. Vitamin labs will need to be rechecked in 30-60 days after supplementation; this was discussed with patient. Per chart, pt is down ~19lbs since admission. Pt appears to be down ~3lbs from her last documented weight from February. NFPE appears unchanged today. Pt is agreeable to Ensure supplements with diet advancement.    Medications reviewed and include: lovenox, synthroid, melatonin, remeron, protonix  Labs reviewed: K 4.0 wnl P 3.7 wnl, Mg 2.0 wnl- 3/17 Folate 11.3 wnl, vitamin D 71.09 wnl, vitamin A 13.5(L), vitamin E 7.5 wnl, B1 113.2 wnl, B12 3,517(H), zinc 18(L), copper 26 (L)- 3/6 Wbc- 16.9(H), Hgb 10.7(L), Hct 30.5(L)  Nutrition Focused Physical Exam:  Flowsheet Row Most Recent Value  Orbital Region Mild depletion  Upper Arm Region Moderate depletion  Thoracic and Lumbar Region Moderate depletion  Buccal Region Mild depletion  Temple Region Moderate depletion  Clavicle Bone Region Severe depletion  Clavicle and Acromion Bone Region Severe depletion  Scapular Bone Region Moderate depletion  Dorsal Hand Mild depletion  Patellar Region Severe depletion  Anterior Thigh Region Severe depletion  Posterior Calf Region Severe depletion  Edema (RD Assessment) None  Hair Reviewed  Eyes Reviewed  Mouth Reviewed  Skin Reviewed  Nails Reviewed   Diet Order:   Diet Order             Diet clear liquid Room service appropriate? Yes; Fluid consistency: Thin  Diet effective now  EDUCATION NEEDS:   Education needs have been addressed  Skin:  Skin Assessment: Reviewed RN Assessment (incision abdomen)  Last BM:  3/18- type 7  Height:   Ht Readings from Last 1 Encounters:  05/13/23 4\' 11"  (1.499 m)    Weight:   Wt  Readings from Last 1 Encounters:  05/24/23 31.9 kg    Ideal Body Weight:  44.5 kg  Estimated Nutritional Needs:   Kcal:  1200-1400kcal/day  Protein:  60-70g/day  Fluid:  1.1-1.3L/day  Betsey Holiday MS, RD, LDN If unable to be reached, please send secure chat to "RD inpatient" available from 8:00a-4:00p daily

## 2023-05-25 LAB — PHOSPHORUS: Phosphorus: 3 mg/dL (ref 2.5–4.6)

## 2023-05-25 LAB — MAGNESIUM: Magnesium: 1.7 mg/dL (ref 1.7–2.4)

## 2023-05-25 MED ORDER — ENSURE ENLIVE PO LIQD
237.0000 mL | Freq: Two times a day (BID) | ORAL | Status: DC
  Administered 2023-05-25 (×2): 237 mL via ORAL

## 2023-05-25 MED ORDER — MAGNESIUM SULFATE 2 GM/50ML IV SOLN
2.0000 g | Freq: Once | INTRAVENOUS | Status: AC
  Administered 2023-05-25: 2 g via INTRAVENOUS
  Filled 2023-05-25: qty 50

## 2023-05-25 NOTE — Progress Notes (Signed)
 PHARMACY CONSULT NOTE - ELECTROLYTES  Pharmacy Consult for Electrolyte Monitoring and Replacement   Recent Labs: Height: 4\' 11"  (149.9 cm) Weight: 34.4 kg (75 lb 13.4 oz) IBW/kg (Calculated) : 43.2 Estimated Creatinine Clearance: 41.6 mL/min (by C-G formula based on SCr of 0.66 mg/dL). Potassium (mmol/L)  Date Value  05/24/2023 4.0  04/08/2014 3.8   Magnesium (mg/dL)  Date Value  16/12/9602 1.7   Calcium (mg/dL)  Date Value  54/11/8117 7.7 (L)   Calcium, Total (mg/dL)  Date Value  14/78/2956 9.2   Albumin (g/dL)  Date Value  21/30/8657 1.7 (L)  02/09/2019 3.6 (L)  04/08/2014 4.0   Phosphorus (mg/dL)  Date Value  84/69/6295 3.0   Sodium (mmol/L)  Date Value  05/24/2023 135  02/09/2019 138  04/08/2014 140    Assessment  Tanya Nguyen is a 58 y.o. female s/p exploratory laparotomy, lysis of adhesions, resection of previous gastrojejunostomy and jejunojejunostomy, creation of new Roux-en-Y gastrojejunostomy, new feeding jejunostomy. PMH significant for GERD, hypothyroidism, depression, PTSD, Bipolar I disorder, chronic low back pain, and electrolyte imbalance. Pharmacy has been consulted to monitor and replace electrolytes.  Diet: Tube feeds @50  ml/hr,   NPO sips w/ meds MIVF: none Pertinent medications:    Free water 30 ml per tube q4h  Goal of Therapy: Electrolytes WNL  Plan:  Mg = 1.7, order MagSulf 2 g IV x 1 Recheck electrolytes with AM labs  Barrie Folk, PharmD Clinical Pharmacist 05/25/2023 7:48 AM

## 2023-05-25 NOTE — Progress Notes (Signed)
 [facility] SUR[facility]ICAL PROGRESS NOTE  Hospital Day(s): 13.   Post op day(s): 13 Days Post-Op.   Interval History:  Patient seen and examined No acute events overnight Patient did have nausea with CLD overnight  Abdominal soreness expectedly No fever, chills, emesis NGT clamped overnight; reconnected this AM for residual checks  No electrolyte derangements Tube feeds; now nocturnal  She is on CLD She is having bowel function   Vital signs in last 24 hours: [min-max] current  Temp:  [98 F (36.7 C)-98.6 F (37 C)] 98.6 F (37 C) (03/18 2126) Pulse Rate:  [102-106] 102 (03/18 2126) Resp:  [16-18] 16 (03/18 2126) BP: (118-126)/(73-77) 126/75 (03/18 2126) SpO2:  [98 %-99 %] 99 % (03/18 2126) Weight:  [34.4 kg] 34.4 kg (03/19 0500)     Height: 4\' 11"  (149.9 cm) Weight: 34.4 kg BMI (Calculated): 15.31   Intake/Output last 2 shifts:  03/18 0701 - 03/19 0700 In: 360 [P.O.:360] Out: 50 [Emesis/NG output:50]   Physical Exam:  Constitutional: alert, cooperative and no distress  HEENT: NGT in place; clamped Respiratory: breathing non-labored at rest  Cardiovascular: regular rate and sinus rhythm  Gastrointestinal: soft, incisional soreness, and non-distended. No rebound/guarding. Jejunostomy tube in left abdomen; nocturnal tube feeds  Integumentary: Laparotomy is CDI with staples, no erythema   Labs:     Latest Ref Rng & Units 05/24/2023    6:10 AM 05/15/2023    5:50 AM 05/13/2023    6:13 AM  CBC  WBC 4.0 - 10.5 K/uL 16.9  8.7  11.0   Hemoglobin 12.0 - 15.0 g/dL 32.4  40.1  02.7   Hematocrit 36.0 - 46.0 % 30.5  33.3  32.0   Platelets 150 - 400 K/uL 637  200  205       Latest Ref Rng & Units 05/24/2023    6:10 AM 05/23/2023    6:04 AM 05/22/2023    5:29 AM  CMP  Glucose 70 - 99 mg/dL 97  253  664   BUN 6 - 20 mg/dL 12  10  10    Creatinine 0.44 - 1.00 mg/dL 4.03  4.74  2.59   Sodium 135 - 145 mmol/L 135  138  136   Potassium 3.5 - 5.1 mmol/L 4.0  4.1  3.6    Chloride 98 - 111 mmol/L 97  96  97   CO2 22 - 32 mmol/L 32  33  34   Calcium 8.9 - 10.3 mg/dL 7.7  7.8  7.3      Imaging studies: No new pertinent imaging studies   Assessment/Plan:  58 y.o. female 13 Days Post-Op s/p exploratory laparotomy, lysis of adhesions, resection of previous gastrojejunostomy and jejunojejunostomy, creation of new Roux-en-Y gastrojejunostomy, new feeding jejunostomy.   - We will recheck residuals this AM given overnight nausea. If residuals remain <150 ccs, we can consider FLD today   - Again, if she fails to tolerate PO and requires decompression, we will consider gastrostomy tube placement for venting. So far no need.   - Enteric feeds now nocturnal; plan to continue this at DC - discussed with CSW/RD  - Monitor abdominal examination - Pain control prn; antiemetics prn  - We will remove staples before DC  - Mobilize as feasible; therapies on board   - Discharge Planning: Doing well, potentially tomorrow -vs- Friday if we can start and she tolerates FLD. Hopefully in next 24-48 hours   All of the above findings and recommendations were discussed with the patient,  and the medical team, and all of patient's questions were answered to her expressed satisfaction.  -- Lynden Oxford, PA-C [facility] 05/25/2023, 7:41 AM M-F: 7am - 4pm

## 2023-05-25 NOTE — TOC Progression Note (Signed)
 Transition of Care [facility]) - Progression Note    Patient Details  Name: Tanya Nguyen MRN: [id number] Date of Birth: April 12, 1965  [facility] CM/SW Contact  Chapman Fitch, RN Phone Number: 05/25/2023, 4:03 PM  Clinical Narrative:    Signed tube feed orders were sent to Mitch with [employer] this morning   Expected Discharge Plan: Home w Home Health Services Barriers to Discharge: Continued Medical Work up  Expected Discharge Plan and Services       Living arrangements for the past 2 months: Single Family Home                 DME Arranged: Tube feeding DME Agency: [employer] Date DME Agency Contacted: 05/20/23   Representative spoke with at DME Agency: Marthann Schiller HH Arranged: RN, PT, OT [facility] Agency: [facility] Date [facility] Agency Contacted: 05/20/23   Representative spoke with at [facility] Agency: Kandee Keen   Social Determinants of Health (SDOH) Interventions SDOH Screenings   Food Insecurity: No Food Insecurity (05/13/2023)  Housing: Low Risk  (05/13/2023)  Transportation Needs: No Transportation Needs (05/13/2023)  Utilities: Not At Risk (05/13/2023)  Depression (PHQ2-9): Medium Risk (03/17/2023)  Social Connections: Socially Integrated (03/30/2023)  Tobacco Use: Low Risk  (05/12/2023)    Readmission Risk Interventions    05/20/2023   12:24 PM  Readmission Risk Prevention Plan  Transportation Screening Complete  PCP or Specialist Appt within 5-7 Days Complete  Home Care Screening Complete  Medication Review (RN CM) Complete

## 2023-05-25 NOTE — Progress Notes (Signed)
 Occupational Therapy Treatment Patient Details Name: Tanya Nguyen MRN: [id number] DOB: 1965-06-25 Today's Date: 05/25/2023   History of present illness Pt admitted for gastric outlet obstruction and is s/p exploratory laparotomy, lysis of adhesions, resection of previous gastrojejunostomy and jejunojejunostomy, creation of new Roux-en-Y gastrojejunostomy, new feeding jejunostomy. NGT placed 3/10. PMH significant for GERD, hypothyroidism, depression, PTSD, Bipolar I disorder, chronic low back pain, and electrolyte imbalance.   OT comments  Pt is supine in bed on arrival. Pleasant and agreeable to OT session. She is feeling much better today and agreeable to ADL session and endurance building. Pt performed bed mobility MOD I, STS MOD I and demo ability to stand at sink with unilateral support and no LOB x10-15 mins to perform bathing tasks with SUP. She ambulated to the door and back x2 using RW with SUP and handoff provided to mobility tech to continue mobility in hallway. DC recommendation remains appropriate with pt progressing towards her goals and will cont to require skilled acute OT services to maximize her safety and IND to return to PLOF.       If plan is discharge home, recommend the following:  A little help with walking and/or transfers;A little help with bathing/dressing/bathroom;Assistance with cooking/housework;Assist for transportation;Help with stairs or ramp for entrance   Equipment Recommendations  BSC/3in1    Recommendations for Other Services      Precautions / Restrictions Precautions Precautions: Fall Recall of Precautions/Restrictions: Intact Restrictions Weight Bearing Restrictions Per Provider Order: No       Mobility Bed Mobility Overal bed mobility: Modified Independent                  Transfers Overall transfer level: Modified independent   Transfers: Sit to/from Stand Sit to Stand: Modified independent (Device/Increase time)            General transfer comment: ambulated to the door and back using RW with SUP with handoff to mobility tech     Balance Overall balance assessment: Needs assistance, History of Falls Sitting-balance support: Feet supported, No upper extremity supported Sitting balance-Leahy Scale: Good     Standing balance support: Single extremity supported Standing balance-Leahy Scale: Fair Standing balance comment: unilateral support on sink during standing bathing tasks with no LOB                           ADL either performed or assessed with clinical judgement   ADL       Grooming: Wash/dry hands;Wash/dry face;Standing;Brushing hair;Supervision/safety Grooming Details (indicate cue type and reason): at sink Upper Body Bathing: Supervision/ safety;Standing Upper Body Bathing Details (indicate cue type and reason): at sink-assist for her back only Lower Body Bathing: Supervison/ safety;Sit to/from stand Lower Body Bathing Details (indicate cue type and reason): bathed peri region only in standing with no physical assist standing at sink with unilateral support Upper Body Dressing : Sitting;Minimal assistance Upper Body Dressing Details (indicate cue type and reason): don/doff new gown to manage IVs, NG tube, etc.                        Extremity/Trunk Assessment              Vision       Perception     Praxis     Communication Communication Communication: No apparent difficulties   Cognition Arousal: Alert Behavior During Therapy: [facility] for tasks assessed/performed  Following commands: Intact        Cueing   Cueing Techniques: Verbal cues  Exercises      Shoulder Instructions       General Comments      Pertinent Vitals/ Pain       Pain Assessment Pain Assessment: Faces Faces Pain Scale: Hurts a little bit Pain Location: stomach Pain Descriptors / Indicators: Discomfort, Grimacing Pain  Intervention(s): Monitored during session, Repositioned  Home Living                                          Prior Functioning/Environment              Frequency  Min 2X/week        Progress Toward Goals  OT Goals(current goals can now be found in the care plan section)  Progress towards OT goals: Progressing toward goals  Acute Rehab OT Goals Patient Stated Goal: return home OT Goal Formulation: With patient Time For Goal Achievement: 05/28/23 Potential to Achieve Goals: Fair  Plan      Co-evaluation                 AM-PAC OT "6 Clicks" Daily Activity     Outcome Measure   Help from another person eating meals?: None Help from another person taking care of personal grooming?: None Help from another person toileting, which includes using toliet, bedpan, or urinal?: A Little Help from another person bathing (including washing, rinsing, drying)?: A Little Help from another person to put on and taking off regular upper body clothing?: None Help from another person to put on and taking off regular lower body clothing?: A Little 6 Click Score: 21    End of Session Equipment Utilized During Treatment: Rolling walker (2 wheels)  OT Visit Diagnosis: Unsteadiness on feet (R26.81);Repeated falls (R29.6);Muscle weakness (generalized) (M62.81)   Activity Tolerance Patient tolerated treatment well   Patient Left  (handoff to mobility tech)   Nurse Communication Mobility status        Time: 9528-4132 OT Time Calculation (min): 21 min  Charges: OT General Charges $OT Visit: 1 Visit OT Treatments $Self Care/Home Management : 8-22 mins  Damon Baisch, OTR/L  05/25/23, 11:12 AM   Shakeyla Giebler E Martinique Pizzimenti 05/25/2023, 11:11 AM

## 2023-05-25 NOTE — Progress Notes (Signed)
 Mobility Specialist - Progress Note   05/25/23 1059  Mobility  Activity Ambulated with assistance in hallway  Level of Assistance Standby assist, set-up cues, supervision of patient - no hands on  Assistive Device Front wheel walker  Distance Ambulated (ft) 160 ft  Activity Response Tolerated well  Mobility visit 1 Mobility  Mobility Specialist Start Time (ACUTE ONLY) 1045  Mobility Specialist Stop Time (ACUTE ONLY) 1054  Mobility Specialist Time Calculation (min) (ACUTE ONLY) 9 min   Pt standing at the doorway with OT upon arrival, MS acquires session. Pt amb one lap around the NS with supervision, tolerated well--- denied pain. Pt returned to the room, left semi fowler with alarm set and needs within reach.  Tanya Nguyen Mobility Specialist 05/25/23 11:07 AM

## 2023-05-26 LAB — MAGNESIUM: Magnesium: 2.2 mg/dL (ref 1.7–2.4)

## 2023-05-26 MED ORDER — ACETAMINOPHEN 160 MG/5ML PO SOLN: 500.0000 mg | Freq: Four times a day (QID) | ORAL | Status: DC | PRN

## 2023-05-26 MED ORDER — BOOST PLUS PO LIQD
237.0000 mL | Freq: Three times a day (TID) | ORAL | Status: DC
  Administered 2023-05-27: 237 mL via ORAL

## 2023-05-26 MED ORDER — OXYCODONE HCL 5 MG/5ML PO SOLN
5.0000 mg | ORAL | Status: DC | PRN
  Administered 2023-05-26 – 2023-05-27 (×7): 5 mg via ORAL
  Filled 2023-05-26 (×7): qty 5

## 2023-05-26 NOTE — Progress Notes (Signed)
 Patient is not able to walk the distance required to go the bathroom, or he/she is unable to safely negotiate stairs required to access the bathroom.  A 3in1 BSC will alleviate this problem

## 2023-05-26 NOTE — Progress Notes (Signed)
 [facility] PROGRESS NOTE  Hospital Day(s): 14.   Post op day(s): 14 Days Post-Op.   Interval History:  Patient seen and examined No acute events overnight Patient she is doing well; still with intermittent nausea but did not require NGT to suction  No fever, chills, emesis NGT clamped all day yesterday No electrolyte derangements Tube feeds; now nocturnal  She is on FLD She is having bowel function   Vital signs in last 24 hours: [min-max] current  Temp:  [97.7 F (36.5 C)-98.3 F (36.8 C)] 98.3 F (36.8 C) (03/20 0406) Pulse Rate:  [94-105] 94 (03/20 0406) Resp:  [16] 16 (03/20 0406) BP: (112-118)/(66-73) 118/66 (03/20 0406) SpO2:  [97 %-99 %] 97 % (03/20 0406) Weight:  [31.6 kg] 31.6 kg (03/20 0406)     Height: 4\' 11"  (149.9 cm) Weight: 31.6 kg BMI (Calculated): 14.06   Intake/Output last 2 shifts:  03/19 0701 - 03/20 0700 In: 1221.8 [P.O.:120; NG/GT:1001.8; IV Piggyback:100] Out: -    Physical Exam:  Constitutional: alert, cooperative and no distress  HEENT: NGT in place; clamped Respiratory: breathing non-labored at rest  Cardiovascular: regular rate and sinus rhythm  Gastrointestinal: soft, incisional soreness, and non-distended. No rebound/guarding. Jejunostomy tube in left abdomen; nocturnal tube feeds  Integumentary: Laparotomy is CDI with staples, no erythema   Labs:     Latest Ref Rng & Units 05/24/2023    6:10 AM 05/15/2023    5:50 AM 05/13/2023    6:13 AM  CBC  WBC 4.0 - 10.5 K/uL 16.9  8.7  11.0   Hemoglobin 12.0 - 15.0 g/dL 16.1  09.6  04.5   Hematocrit 36.0 - 46.0 % 30.5  33.3  32.0   Platelets 150 - 400 K/uL 637  200  205       Latest Ref Rng & Units 05/24/2023    6:10 AM 05/23/2023    6:04 AM 05/22/2023    5:29 AM  CMP  Glucose 70 - 99 mg/dL 97  409  811   BUN 6 - 20 mg/dL 12  10  10    Creatinine 0.44 - 1.00 mg/dL 9.14  7.82  9.56   Sodium 135 - 145 mmol/L 135  138  136   Potassium 3.5 - 5.1 mmol/L 4.0  4.1  3.6    Chloride 98 - 111 mmol/L 97  96  97   CO2 22 - 32 mmol/L 32  33  34   Calcium 8.9 - 10.3 mg/dL 7.7  7.8  7.3      Imaging studies: No new pertinent imaging studies   Assessment/Plan:  58 y.o. female 14 Days Post-Op s/p exploratory laparotomy, lysis of adhesions, resection of previous gastrojejunostomy and jejunojejunostomy, creation of new Roux-en-Y gastrojejunostomy, new feeding jejunostomy.   - We will recheck residuals this again this AM. If residuals remain <150 ccs, we will DC the NGT  - Again, if she fails to tolerate PO and requires decompression, we will consider gastrostomy tube placement for venting. So far no need.   - Enteric feeds now nocturnal; plan to continue this at DC - discussed with CSW/RD  - Monitor abdominal examination - Pain control prn; will add liquid PO medications - Antiemetics prn  - We will remove staples before DC  - Mobilize as feasible; therapies on board   - Discharge Planning: Doing well, if she tolerates clamping trial this AM and we can remove this, she is ready for DC from surgical perspective as long as all  home health/tube feed needs are arranged. Potentially this PM vs tomorrow (03/21).  All of the above findings and recommendations were discussed with the patient, and the medical team, and all of patient's questions were answered to her expressed satisfaction.  -- Lynden Oxford, PA-C [facility] 05/26/2023, 7:29 AM M-F: 7am - 4pm

## 2023-05-26 NOTE — Progress Notes (Signed)
 No residual from NG tube. Henrene Dodge, MD notified. NG tube removed per order. Pt tolerated well.

## 2023-05-26 NOTE — Plan of Care (Signed)
  Problem: Coping: Goal: Level of anxiety will decrease Outcome: Progressing   Problem: Pain Managment: Goal: General experience of comfort will improve and/or be controlled Outcome: Progressing   Problem: Safety: Goal: Ability to remain free from injury will improve Outcome: Progressing   Problem: Skin Integrity: Goal: Risk for impaired skin integrity will decrease Outcome: Progressing

## 2023-05-26 NOTE — Progress Notes (Signed)
 This RN discussed and demonstrated tube feeding teaching with [employer] with pt's cousin, Synetta Fail, at bedside. Synetta Fail verbalized understanding.

## 2023-05-26 NOTE — Progress Notes (Signed)
 Synetta Fail, cousin, at bedside speaking with [facility] nutrition department on speaker phone who stated formula tube feeding, pump, and supplies will be delivered to bedside this evening and more tube feeding and supplies will be delivered to pt's house via [employer] on Saturday 3/22. Judeth Cornfield, CM and Henrene Dodge, MD notified.

## 2023-05-26 NOTE — Progress Notes (Signed)
 PHARMACY CONSULT NOTE - ELECTROLYTES  Pharmacy Consult for Electrolyte Monitoring and Replacement   Recent Labs: Height: 4\' 11"  (149.9 cm) Weight: 31.6 kg (69 lb 10.7 oz) IBW/kg (Calculated) : 43.2 Estimated Creatinine Clearance: 38.2 mL/min (by C-G formula based on SCr of 0.66 mg/dL). Potassium (mmol/L)  Date Value  05/24/2023 4.0  04/08/2014 3.8   Magnesium (mg/dL)  Date Value  62/13/0865 2.2   Calcium (mg/dL)  Date Value  78/46/9629 7.7 (L)   Calcium, Total (mg/dL)  Date Value  52/84/1324 9.2   Albumin (g/dL)  Date Value  40/12/2723 1.7 (L)  02/09/2019 3.6 (L)  04/08/2014 4.0   Phosphorus (mg/dL)  Date Value  36/64/4034 3.0   Sodium (mmol/L)  Date Value  05/24/2023 135  02/09/2019 138  04/08/2014 140    Assessment  Tanya Nguyen is a 58 y.o. female s/p exploratory laparotomy, lysis of adhesions, resection of previous gastrojejunostomy and jejunojejunostomy, creation of new Roux-en-Y gastrojejunostomy, new feeding jejunostomy. PMH significant for GERD, hypothyroidism, depression, PTSD, Bipolar I disorder, chronic low back pain, and electrolyte imbalance. Pharmacy has been consulted to monitor and replace electrolytes.  Diet: Tube feeds @50  ml/hr,   NPO sips w/ meds MIVF: none Pertinent medications:    Free water 30 ml per tube q4h  Goal of Therapy: Electrolytes WNL  Plan:  No replacement indicated at this time Recheck electrolytes with next 72 hours  Barrie Folk, PharmD Clinical Pharmacist 05/26/2023 7:01 AM

## 2023-05-26 NOTE — Progress Notes (Signed)
 BSC and RW delivered to bedside.

## 2023-05-27 MED ORDER — ONDANSETRON 4 MG PO TBDP: 4.0000 mg | ORAL_TABLET | Freq: Four times a day (QID) | ORAL | 0 refills | Status: DC | PRN

## 2023-05-27 MED ORDER — OXYCODONE HCL 5 MG/5ML PO SOLN: 5.0000 mg | ORAL | 0 refills | Status: AC | PRN

## 2023-05-27 NOTE — Evaluation (Signed)
 Occupational Therapy Re-Evaluation Patient Details Name: Tanya Nguyen MRN: [id number] DOB: [birth year] Today's Date: 05/27/2023   History of Present Illness   Pt admitted for gastric outlet obstruction and is s/p exploratory laparotomy, lysis of adhesions, resection of previous gastrojejunostomy and jejunojejunostomy, creation of new Roux-en-Y gastrojejunostomy, new feeding jejunostomy. NGT placed 3/10. PMH significant for GERD, hypothyroidism, depression, PTSD, Bipolar I disorder, chronic low back pain, and electrolyte imbalance.     Clinical Impressions Pt making good progress towards goals, met 1 goal this date. Goals updated. Pt hopeful to discharge today. Completes bed mobility MOD I, walks ~200 ft in hallway with supervision and RW, and performs toileting tasks BSC <> bed with supervision. Reports fatigue, but is motivated to participate and regain strength. Pt will continue to benefit from OT services to address functional deficits in activity tolerance, balance and strength. Will continue to follow acutely.      If plan is discharge home, recommend the following:   A little help with walking and/or transfers;A little help with bathing/dressing/bathroom;Assistance with cooking/housework;Assist for transportation;Help with stairs or ramp for entrance      Equipment Recommendations   BSC/3in1      Precautions/Restrictions   Precautions Precautions: Fall Recall of Precautions/Restrictions: Intact Restrictions Weight Bearing Restrictions Per Provider Order: No     Mobility Bed Mobility Overal bed mobility: Modified Independent                  Transfers Overall transfer level: Modified independent Equipment used: Rolling walker (2 wheels) Transfers: Sit to/from Stand Sit to Stand: Modified independent (Device/Increase time)                  Balance Overall balance assessment: Needs assistance, History of Falls Sitting-balance support: Feet  supported, No upper extremity supported Sitting balance-Leahy Scale: Good     Standing balance support: Bilateral upper extremity supported Standing balance-Leahy Scale: Good                             ADL either performed or assessed with clinical judgement   ADL Overall ADL's : Needs assistance/impaired                         Toilet Transfer: Supervision/safety;Ambulation;BSC/3in1;Rolling walker (2 wheels) Toilet Transfer Details (indicate cue type and reason): steady during transfer to [facility] after mobility in hallway Toileting- Clothing Manipulation and Hygiene: Supervision/safety       Functional mobility during ADLs: Supervision/safety;Rolling walker (2 wheels)        Pertinent Vitals/Pain Pain Assessment Pain Assessment: 0-10 Pain Score: 7  Pain Location: stomach Pain Descriptors / Indicators: Discomfort, Grimacing Pain Intervention(s): Limited activity within patient's tolerance, RN gave pain meds during session     Extremity/Trunk Assessment Upper Extremity Assessment Upper Extremity Assessment: Generalized weakness   Lower Extremity Assessment Lower Extremity Assessment: Generalized weakness       Communication Communication Communication: No apparent difficulties   Cognition Arousal: Alert Behavior During Therapy: WFL for tasks assessed/performed Cognition: No apparent impairments                               Following commands: Intact       Cueing  General Comments   Cueing Techniques: Verbal cues  Edu on ECS strategies for at home      OT Goals(Current goals can be found in the care  plan section)   Acute Rehab OT Goals OT Goal Formulation: With patient Time For Goal Achievement: 06/03/23 Potential to Achieve Goals: Good ADL Goals Pt Will Perform Grooming: with modified independence;standing Pt Will Perform Lower Body Dressing: with modified independence;sit to/from stand Pt Will Transfer to  Toilet: with modified independence;ambulating Pt Will Perform Toileting - Clothing Manipulation and hygiene: with modified independence;sit to/from stand   OT Frequency:  Min 2X/week       AM-PAC OT "6 Clicks" Daily Activity     Outcome Measure Help from another person eating meals?: None Help from another person taking care of personal grooming?: None Help from another person toileting, which includes using toliet, bedpan, or urinal?: A Little Help from another person bathing (including washing, rinsing, drying)?: A Little Help from another person to put on and taking off regular upper body clothing?: None Help from another person to put on and taking off regular lower body clothing?: A Little 6 Click Score: 21   End of Session Equipment Utilized During Treatment: Rolling walker (2 wheels) Nurse Communication: Mobility status  Activity Tolerance: Patient tolerated treatment well Patient left: in bed;with bed alarm set;with call bell/phone within reach  OT Visit Diagnosis: Unsteadiness on feet (R26.81);Repeated falls (R29.6);Muscle weakness (generalized) (M62.81)                Time: 1610-9604 OT Time Calculation (min): 25 min Charges:  OT General Charges $OT Visit: 1 Visit OT Evaluation $OT Re-eval: 1 Re-eval OT Treatments $Self Care/Home Management : 23-37 mins  Tanya Nguyen, OTR/L  05/27/23, 11:02 AM

## 2023-05-27 NOTE — Discharge Summary (Signed)
 [facility] SURGICAL DISCHARGE SUMMARY  Patient ID: Tanya Nguyen MRN: [id number] DOB/AGE: Nov 27, 1965 58 y.o.  Admit date: 05/12/2023 Discharge date: 05/27/2023  Discharge Diagnoses Patient Active Problem List   Diagnosis Date Noted   Gastric outlet obstruction 05/12/2023   Nausea vomiting and diarrhea 03/30/2023    Consultants Interventional Radiology  RD  Procedures 05/12/2023:  Exploratory Laparotomy, lysis of adhesions, resection of previous gastrojejunostomy and jejunojejunostomy, creation of new Roux-en-Y gastrojejunostomy, new feeding jejunostomy.   HPI: Tanya Nguyen is a 58 y.o. female with history of GOO s/p gastrojejunostomy in 2019 who has been followed for progressive trouble swallowing, tolerating PO, and emesis.   Hospital Course: Informed consent was obtained and documented, and patient underwent exploratory Laparotomy, lysis of adhesions, resection of previous gastrojejunostomy and jejunojejunostomy, creation of new Roux-en-Y gastrojejunostomy, new feeding jejunostomy (Dr Aleen Campi, 05/12/2023).  Post-operatively, patient was kept NPO with NGT and supplemental tube feeds via jejunostomy. On POD4, she underwent UGI which was initially concerning for possible leak. She had follow up CT Abdomen/Pelvis which did not show extravasation of contrast. UGI was repeated again on 03/13 with SBFT and again there was concern for extravasation fo contrast but again follow up CT did not show this. Follow up KUB on 03/14 showed PO contrast throughout the colon. She would undergo longer clamping trial on 03/17-03/18 which she would pass. She was started on CLD with NGT in place and tolerated well. Residuals were check and advanced to FLD. Residuals also check on FLD and were reassuring. NGT was removed. Tube feeds were transitioned to nocturnal and DC planning was initiated.    The remainder of patient's hospital course was essentially unremarkable, and discharge planning was  initiated accordingly with patient safely able to be discharged home with appropriate discharge instructions, pain control, and outpatient follow-up after all of her questions were answered to her expressed satisfaction.   Discharge Condition: Good    Physical Examination:  Constitutional: alert, cooperative and no distress  Respiratory: breathing non-labored at rest  Cardiovascular: regular rate and sinus rhythm  Gastrointestinal: soft, incisional soreness, and non-distended. No rebound/guarding. Jejunostomy tube in left abdomen; nocturnal tube feeds  Integumentary: Laparotomy is CDI, staples removed, steri-strips placed. No erythema or drainage    Allergies as of 05/27/2023       Reactions   Penicillins Shortness Of Breath, Diarrhea, Nausea And Vomiting   TOLERATED CEPHALOSPORINS   Nsaids Other (See Comments)   Pt states NSAIDS cause "internal bleeding"   Gabapentin Other (See Comments)   Hair Loss   Pregabalin    Tremors   Sulfa Antibiotics Nausea And Vomiting        Medication List     TAKE these medications    acetaminophen 500 MG tablet Commonly known as: TYLENOL Take 500-1,000 mg by mouth every 6 (six) hours as needed for moderate pain (pain score 4-6) or headache.   ARIPiprazole 2 MG tablet Commonly known as: ABILIFY Take 1 tablet (2 mg total) by mouth at bedtime.   bisacodyl 5 MG EC tablet Commonly known as: DULCOLAX Take 5 mg by mouth daily as needed for moderate constipation.   diphenhydrAMINE 25 MG tablet Commonly known as: BENADRYL Take 25 mg by mouth every 6 (six) hours as needed for allergies.   hydrOXYzine 25 MG tablet Commonly known as: ATARAX Take 1 tablet (25 mg total) by mouth daily as needed for anxiety.   levothyroxine 50 MCG tablet Commonly known as: SYNTHROID Take 1 tablet by mouth every morning on an  empty stomach with water only.  No food or other medications for 30 minutes.   mirtazapine 15 MG tablet Commonly known as:  REMERON Take 1 tablet (15 mg total) by mouth at bedtime.   multivitamin with minerals Tabs tablet Take 1 tablet by mouth daily.   ondansetron 4 MG disintegrating tablet Commonly known as: ZOFRAN-ODT Take 1 tablet (4 mg total) by mouth every 8 (eight) hours as needed for nausea or vomiting. What changed: Another medication with the same name was added. Make sure you understand how and when to take each.   ondansetron 4 MG disintegrating tablet Commonly known as: ZOFRAN-ODT Take 1 tablet (4 mg total) by mouth every 6 (six) hours as needed for nausea. What changed: You were already taking a medication with the same name, and this prescription was added. Make sure you understand how and when to take each.   oxybutynin 5 MG 24 hr tablet Commonly known as: DITROPAN-XL TAKE 1 TABLET (5 MG TOTAL) BY MOUTH AT BEDTIME. FOR BLADDER INCONTINENCE.   oxyCODONE 5 MG/5ML solution Commonly known as: ROXICODONE Take 5 mLs (5 mg total) by mouth every 4 (four) hours as needed for up to 14 days for moderate pain (pain score 4-6) or severe pain (pain score 7-10).   pantoprazole 40 MG tablet Commonly known as: PROTONIX Take 1 tablet (40 mg total) by mouth daily.   SUMAtriptan 50 MG tablet Commonly known as: IMITREX Take 1 tablet at migraine onset. May repeat in 2 hours if headache persists or recurs.   traMADol 50 MG tablet Commonly known as: ULTRAM Take 1 tablet (50 mg total) by mouth every 12 (twelve) hours as needed.               Durable Medical Equipment  (From admission, onward)           Start     Ordered   05/26/23 1136  For home use only DME Bedside commode  Once       Question:  Patient needs a bedside commode to treat with the following condition  Answer:  Weakness   05/26/23 1135   05/26/23 1135  For home use only DME Walker rolling  Once       Question Answer Comment  Walker: With 5 Inch Wheels   Patient needs a walker to treat with the following condition Weakness       05/26/23 1135   05/20/23 1214  For home use only DME Tube feeding pump  Once       Comments: Osmolite 1.2 @50ml /hr per J-tube- Flush tube with 30ml of water q 4 hours to maintain patency  Question:  Length of Need  Answer:  12 Months   05/20/23 1218              Follow-up Information     Piscoya, Elita Quick, MD. Schedule an appointment as soon as possible for a visit in 2 week(s).   Specialty: General Surgery Why: s/p gastrojejunostomy revision, feeding jejunostomy tube Contact information: [address] [place] [address] [phone]                  Time spent on discharge management including discussion of hospital course, clinical condition, outpatient instructions, prescriptions, and follow up with the patient and members of the medical team: >30 minutes  -- Lynden Oxford , PA-C [facility]  05/27/2023, 12:00 PM [phone] M-F: 7am - 4pm

## 2023-05-27 NOTE — Progress Notes (Signed)
 PHARMACY CONSULT NOTE - ELECTROLYTES  Pharmacy Consult for Electrolyte Monitoring and Replacement   Recent Labs: Height: 4\' 11"  (149.9 cm) Weight: 34.2 kg (75 lb 6.4 oz) IBW/kg (Calculated) : 43.2 Estimated Creatinine Clearance: 41.4 mL/min (by C-G formula based on SCr of 0.66 mg/dL). Potassium (mmol/L)  Date Value  05/24/2023 4.0  04/08/2014 3.8   Magnesium (mg/dL)  Date Value  16/12/9602 2.2   Calcium (mg/dL)  Date Value  54/11/8117 7.7 (L)   Calcium, Total (mg/dL)  Date Value  14/78/2956 9.2   Albumin (g/dL)  Date Value  21/30/8657 1.7 (L)  02/09/2019 3.6 (L)  04/08/2014 4.0   Phosphorus (mg/dL)  Date Value  84/69/6295 3.0   Sodium (mmol/L)  Date Value  05/24/2023 135  02/09/2019 138  04/08/2014 140    Assessment  Tanya Nguyen is a 58 y.o. female s/p exploratory laparotomy, lysis of adhesions, resection of previous gastrojejunostomy and jejunojejunostomy, creation of new Roux-en-Y gastrojejunostomy, new feeding jejunostomy. PMH significant for GERD, hypothyroidism, depression, PTSD, Bipolar I disorder, chronic low back pain, and electrolyte imbalance. Pharmacy has been consulted to monitor and replace electrolytes.  Diet: Tube feeds @50  ml/hr,   NPO sips w/ meds MIVF: none Pertinent medications:    Free water 30 ml per tube q4h  Goal of Therapy: Electrolytes WNL  Plan:  No replacement indicated at this time Discharge orders in for today 05/27/23, pharmacy will sign-off electrolyte consult. Please reenter consult if needed.  Barrie Folk, PharmD Clinical Pharmacist 05/27/2023 11:55 AM

## 2023-05-27 NOTE — TOC Transition Note (Addendum)
 Transition of Care [facility]) - Discharge Note   Patient Details  Name: Tanya Nguyen MRN: [id number] Date of Birth: 1965/04/26  Transition of Care [facility]) CM/SW Contact:  Chapman Fitch, RN Phone Number: 05/27/2023, 12:01 PM   Clinical Narrative:      Patient to discharge today Case of feeds delivered to room by dietitian yesterday Tube feed pump delivered by adapt RW and BSC delivered by adapt  Patient and cousin anita received feeding education by bedside RN yesterday  Synetta Fail to provide dc transport Kandee Keen with [facility] notified of discharge  Confirmed pump, pole, and feeding bags delivered to home    Barriers to Discharge: Continued Medical Work up   Patient Goals and CMS Choice   CMS Medicare.gov Compare Post Acute Care list provided to:: Patient Choice offered to / list presented to : Patient      Discharge Placement                       Discharge Plan and Services Additional resources added to the After Visit Summary for                  DME Arranged: Tube feeding DME Agency: [employer] Date DME Agency Contacted: 05/20/23   Representative spoke with at DME Agency: Marthann Schiller HH Arranged: RN, PT, OT [facility] Agency: [facility] Date [facility] Agency Contacted: 05/20/23   Representative spoke with at [facility] Agency: Kandee Keen  Social Drivers of Health (SDOH) Interventions SDOH Screenings   Food Insecurity: No Food Insecurity (05/13/2023)  Housing: Low Risk  (05/13/2023)  Transportation Needs: No Transportation Needs (05/13/2023)  Utilities: Not At Risk (05/13/2023)  Depression (PHQ2-9): Medium Risk (03/17/2023)  Social Connections: Socially Integrated (03/30/2023)  Tobacco Use: Low Risk  (05/12/2023)     Readmission Risk Interventions    05/20/2023   12:24 PM  Readmission Risk Prevention Plan  Transportation Screening Complete  PCP or Specialist Appt within 5-7 Days Complete  Home Care Screening Complete  Medication Review (RN CM) Complete

## 2023-05-27 NOTE — Progress Notes (Signed)
 Pt has DC order, AVS has been given and explained to pt, husband and Synetta Fail (husband's cousin), all questions has been answered. Education on tube feeding and how to use the Infinity tube feeding was done, supplies provided. HH has been set-up and will come tomorrow at home. Husband provided transportation.

## 2023-05-27 NOTE — Progress Notes (Signed)
 Physical Therapy Re-Evaluation Patient Details Name: Tanya Nguyen MRN: [id number] DOB: 06/25/1965 Today's Date: 05/27/2023   History of Present Illness Pt admitted for gastric outlet obstruction and is s/p exploratory laparotomy, lysis of adhesions, resection of previous gastrojejunostomy and jejunojejunostomy, creation of new Roux-en-Y gastrojejunostomy, new feeding jejunostomy. NGT placed 3/10. PMH significant for GERD, hypothyroidism, depression, PTSD, Bipolar I disorder, chronic low back pain, and electrolyte imbalance.    PT Comments  Pt seen as re evaluation, demonstrated great progress towards goals, goals updated as needed. Pt would benefit from official HEP next visit. She was able to perform bed mobility and transfers modI. The pt was also able to ambulate >257ft with RW and CGA-supervision, and performed stair navigation, CGA with L rail, BUE support. Noted for safe technique, but did report fatigue with activity. Pt is interested in strengthening in order to return to PLOF. The patient would benefit from further skilled PT intervention to continue to progress towards goals and PLOF.      If plan is discharge home, recommend the following: A little help with bathing/dressing/bathroom;Assistance with cooking/housework;Assist for transportation;Help with stairs or ramp for entrance   Can travel by private vehicle        Equipment Recommendations  BSC/3in1;Rolling walker (2 wheels)    Recommendations for Other Services       Precautions / Restrictions Precautions Precautions: Fall Recall of Precautions/Restrictions: Intact Restrictions Weight Bearing Restrictions Per Provider Order: No     Mobility  Bed Mobility Overal bed mobility: Modified Independent                  Transfers Overall transfer level: Modified independent Equipment used: Rolling walker (2 wheels) Transfers: Sit to/from Stand Sit to Stand: Modified independent (Device/Increase time)                 Ambulation/Gait Ambulation/Gait assistance: Contact guard assist, Supervision Gait Distance (Feet): 250 Feet Assistive device: Rolling walker (2 wheels) Gait Pattern/deviations: Step-through pattern, Decreased step length - right, Decreased step length - left Gait velocity: decreased         Stairs Stairs: Yes Stairs assistance: Contact guard assist Stair Management: One rail Left, Step to pattern Number of Stairs: 4 General stair comments: safe technique   Wheelchair Mobility     Tilt Bed    Modified Rankin (Stroke Patients Only)       Balance Overall balance assessment: Needs assistance, History of Falls Sitting-balance support: Feet supported, No upper extremity supported Sitting balance-Leahy Scale: Good     Standing balance support: Bilateral upper extremity supported Standing balance-Leahy Scale: Good                              Communication    Cognition Arousal: Alert     PT - Cognitive impairments: No apparent impairments                                Cueing    Exercises      General Comments        Pertinent Vitals/Pain Pain Assessment Pain Assessment: No/denies pain    Home Living                          Prior Function            PT Goals (current goals can now be found  in the care plan section) Acute Rehab PT Goals Patient Stated Goal: get better so I can go home PT Goal Formulation: With patient Time For Goal Achievement: 06/10/23 Potential to Achieve Goals: Good    Frequency    Min 1X/week      PT Plan      Co-evaluation              AM-PAC PT "6 Clicks" Mobility   Outcome Measure  Help needed turning from your back to your side while in a flat bed without using bedrails?: None Help needed moving from lying on your back to sitting on the side of a flat bed without using bedrails?: None Help needed moving to and from a bed to a chair (including a wheelchair)?:  None Help needed standing up from a chair using your arms (e.g., wheelchair or bedside chair)?: None Help needed to walk in hospital room?: None Help needed climbing 3-5 steps with a railing? : A Little 6 Click Score: 23    End of Session   Activity Tolerance: Patient tolerated treatment well Patient left: in bed;with call bell/phone within reach Nurse Communication: Mobility status PT Visit Diagnosis: Unsteadiness on feet (R26.81);Muscle weakness (generalized) (M62.81);History of falling (Z91.81);Difficulty in walking, not elsewhere classified (R26.2);Pain;Other abnormalities of gait and mobility (R26.89) Pain - Right/Left:  (midline) Pain - part of body:  (abdomen)     Time: 4742-5956 PT Time Calculation (min) (ACUTE ONLY): 13 min  Charges:    $Therapeutic Activity: 8-22 mins PT General Charges $$ ACUTE PT VISIT: 1 Visit                     Olga Coaster PT, DPT 9:16 AM,05/27/23

## 2023-05-27 NOTE — Plan of Care (Signed)

## 2023-05-27 NOTE — Plan of Care (Signed)
?  Problem: Education: ?Goal: Knowledge of General Education information will improve ?Description: Including pain rating scale, medication(s)/side effects and non-pharmacologic comfort measures ?Outcome: Progressing ?  ?Problem: Activity: ?Goal: Risk for activity intolerance will decrease ?Outcome: Progressing ?  ?Problem: Coping: ?Goal: Level of anxiety will decrease ?Outcome: Progressing ?  ?Problem: Elimination: ?Goal: Will not experience complications related to bowel motility ?Outcome: Progressing ?Goal: Will not experience complications related to urinary retention ?Outcome: Progressing ?  ?Problem: Safety: ?Goal: Ability to remain free from injury will improve ?Outcome: Progressing ?  ?

## 2023-05-28 ENCOUNTER — Inpatient Hospital Stay

## 2023-05-28 ENCOUNTER — Emergency Department

## 2023-05-28 ENCOUNTER — Other Ambulatory Visit: Payer: Self-pay

## 2023-05-28 ENCOUNTER — Inpatient Hospital Stay
Admission: EM | Admit: 2023-05-28 | Discharge: 2023-06-03 | DRG: 393 | Disposition: A | Discharge status: Home Health | Attending: Surgery | Admitting: Surgery

## 2023-05-28 DIAGNOSIS — Z803 Family history of malignant neoplasm of breast: Secondary | ICD-10-CM | POA: Diagnosis not present

## 2023-05-28 DIAGNOSIS — F419 Anxiety disorder, unspecified: Secondary | ICD-10-CM | POA: Diagnosis not present

## 2023-05-28 DIAGNOSIS — E43 Unspecified severe protein-calorie malnutrition: Secondary | ICD-10-CM | POA: Diagnosis not present

## 2023-05-28 DIAGNOSIS — Z801 Family history of malignant neoplasm of trachea, bronchus and lung: Secondary | ICD-10-CM

## 2023-05-28 DIAGNOSIS — Z813 Family history of other psychoactive substance abuse and dependence: Secondary | ICD-10-CM | POA: Diagnosis not present

## 2023-05-28 DIAGNOSIS — Z79899 Other long term (current) drug therapy: Secondary | ICD-10-CM

## 2023-05-28 DIAGNOSIS — K56609 Unspecified intestinal obstruction, unspecified as to partial versus complete obstruction: Secondary | ICD-10-CM | POA: Diagnosis not present

## 2023-05-28 DIAGNOSIS — M545 Low back pain, unspecified: Secondary | ICD-10-CM | POA: Diagnosis not present

## 2023-05-28 DIAGNOSIS — G894 Chronic pain syndrome: Secondary | ICD-10-CM | POA: Diagnosis not present

## 2023-05-28 DIAGNOSIS — R140 Abdominal distension (gaseous): Secondary | ICD-10-CM

## 2023-05-28 DIAGNOSIS — G629 Polyneuropathy, unspecified: Secondary | ICD-10-CM | POA: Diagnosis not present

## 2023-05-28 DIAGNOSIS — Z888 Allergy status to other drugs, medicaments and biological substances status: Secondary | ICD-10-CM | POA: Diagnosis not present

## 2023-05-28 DIAGNOSIS — F319 Bipolar disorder, unspecified: Secondary | ICD-10-CM | POA: Diagnosis present

## 2023-05-28 DIAGNOSIS — Z886 Allergy status to analgesic agent status: Secondary | ICD-10-CM | POA: Diagnosis not present

## 2023-05-28 DIAGNOSIS — Z431 Encounter for attention to gastrostomy: Secondary | ICD-10-CM | POA: Diagnosis not present

## 2023-05-28 DIAGNOSIS — Z681 Body mass index (BMI) 19 or less, adult: Secondary | ICD-10-CM | POA: Diagnosis not present

## 2023-05-28 DIAGNOSIS — G43909 Migraine, unspecified, not intractable, without status migrainosus: Secondary | ICD-10-CM | POA: Diagnosis not present

## 2023-05-28 DIAGNOSIS — Z823 Family history of stroke: Secondary | ICD-10-CM

## 2023-05-28 DIAGNOSIS — F4329 Adjustment disorder with other symptoms: Secondary | ICD-10-CM | POA: Diagnosis not present

## 2023-05-28 DIAGNOSIS — R7303 Prediabetes: Secondary | ICD-10-CM | POA: Diagnosis not present

## 2023-05-28 DIAGNOSIS — Z79890 Hormone replacement therapy (postmenopausal): Secondary | ICD-10-CM

## 2023-05-28 DIAGNOSIS — R221 Localized swelling, mass and lump, neck: Secondary | ICD-10-CM | POA: Diagnosis not present

## 2023-05-28 DIAGNOSIS — K219 Gastro-esophageal reflux disease without esophagitis: Secondary | ICD-10-CM | POA: Diagnosis not present

## 2023-05-28 DIAGNOSIS — E039 Hypothyroidism, unspecified: Secondary | ICD-10-CM | POA: Diagnosis not present

## 2023-05-28 DIAGNOSIS — Z880 Allergy status to penicillin: Secondary | ICD-10-CM

## 2023-05-28 DIAGNOSIS — R000 Tachycardia, unspecified: Secondary | ICD-10-CM | POA: Diagnosis not present

## 2023-05-28 DIAGNOSIS — Z809 Family history of malignant neoplasm, unspecified: Secondary | ICD-10-CM | POA: Diagnosis not present

## 2023-05-28 DIAGNOSIS — K311 Adult hypertrophic pyloric stenosis: Secondary | ICD-10-CM | POA: Diagnosis not present

## 2023-05-28 DIAGNOSIS — K315 Obstruction of duodenum: Secondary | ICD-10-CM | POA: Diagnosis not present

## 2023-05-28 DIAGNOSIS — Z882 Allergy status to sulfonamides status: Secondary | ICD-10-CM | POA: Diagnosis not present

## 2023-05-28 DIAGNOSIS — R220 Localized swelling, mass and lump, head: Secondary | ICD-10-CM | POA: Diagnosis not present

## 2023-05-28 DIAGNOSIS — Z4682 Encounter for fitting and adjustment of non-vascular catheter: Secondary | ICD-10-CM | POA: Diagnosis not present

## 2023-05-28 DIAGNOSIS — K9413 Enterostomy malfunction: Principal | ICD-10-CM | POA: Diagnosis present

## 2023-05-28 DIAGNOSIS — Z818 Family history of other mental and behavioral disorders: Secondary | ICD-10-CM

## 2023-05-28 DIAGNOSIS — K7689 Other specified diseases of liver: Secondary | ICD-10-CM | POA: Diagnosis not present

## 2023-05-28 DIAGNOSIS — Z811 Family history of alcohol abuse and dependence: Secondary | ICD-10-CM

## 2023-05-28 DIAGNOSIS — R1110 Vomiting, unspecified: Secondary | ICD-10-CM | POA: Diagnosis not present

## 2023-05-28 LAB — CBC
HCT: 34.3 % — ABNORMAL LOW (ref 36.0–46.0)
Hemoglobin: 11.8 g/dL — ABNORMAL LOW (ref 12.0–15.0)
MCH: 30.5 pg (ref 26.0–34.0)
MCHC: 34.4 g/dL (ref 30.0–36.0)
MCV: 88.6 fL (ref 80.0–100.0)
Platelets: 864 10*3/uL — ABNORMAL HIGH (ref 150–400)
RBC: 3.87 MIL/uL (ref 3.87–5.11)
RDW: 18.4 % — ABNORMAL HIGH (ref 11.5–15.5)
WBC: 13.5 10*3/uL — ABNORMAL HIGH (ref 4.0–10.5)
nRBC: 0 % (ref 0.0–0.2)

## 2023-05-28 LAB — LIPASE, BLOOD: Lipase: 31 U/L (ref 11–51)

## 2023-05-28 LAB — COMPREHENSIVE METABOLIC PANEL
ALT: 19 U/L (ref 0–44)
AST: 22 U/L (ref 15–41)
Albumin: 2 g/dL — ABNORMAL LOW (ref 3.5–5.0)
Alkaline Phosphatase: 114 U/L (ref 38–126)
Anion gap: 6 (ref 5–15)
BUN: 15 mg/dL (ref 6–20)
CO2: 29 mmol/L (ref 22–32)
Calcium: 8 mg/dL — ABNORMAL LOW (ref 8.9–10.3)
Chloride: 96 mmol/L — ABNORMAL LOW (ref 98–111)
Creatinine, Ser: 0.71 mg/dL (ref 0.44–1.00)
GFR, Estimated: >60 mL/min (ref >60)
Glucose, Bld: 132 mg/dL — ABNORMAL HIGH (ref 70–99)
Potassium: 3.7 mmol/L (ref 3.5–5.1)
Sodium: 131 mmol/L — ABNORMAL LOW (ref 135–145)
Total Bilirubin: 0.7 mg/dL (ref 0.0–1.2)
Total Protein: 5.4 g/dL — ABNORMAL LOW (ref 6.5–8.1)

## 2023-05-28 MED ORDER — SUMATRIPTAN SUCCINATE 50 MG PO TABS: 50.0000 mg | ORAL_TABLET | ORAL | Status: DC | PRN

## 2023-05-28 MED ORDER — MIRTAZAPINE 15 MG PO TABS
15.0000 mg | ORAL_TABLET | Freq: Every day | ORAL | Status: DC
  Administered 2023-05-28: 15 mg via ORAL
  Filled 2023-05-28: qty 1

## 2023-05-28 MED ORDER — ONDANSETRON 4 MG PO TBDP
4.0000 mg | ORAL_TABLET | Freq: Four times a day (QID) | ORAL | Status: DC | PRN
  Administered 2023-06-01: 4 mg via ORAL
  Filled 2023-05-28: qty 1

## 2023-05-28 MED ORDER — FREE WATER
30.0000 mL | Status: DC
  Administered 2023-05-28 – 2023-06-03 (×34): 30 mL

## 2023-05-28 MED ORDER — POLYETHYLENE GLYCOL 3350 17 G PO PACK: 17.0000 g | PACK | Freq: Every day | ORAL | Status: DC | PRN

## 2023-05-28 MED ORDER — SODIUM CHLORIDE 0.9 % IV BOLUS
1000.0000 mL | Freq: Once | INTRAVENOUS | Status: AC
  Administered 2023-05-28: 1000 mL via INTRAVENOUS

## 2023-05-28 MED ORDER — OSMOLITE 1.2 CAL PO LIQD
1190.0000 mL | ORAL | Status: DC
  Administered 2023-05-29: 1190 mL
  Administered 2023-05-30: 1000 mL

## 2023-05-28 MED ORDER — IOHEXOL 300 MG/ML  SOLN
100.0000 mL | Freq: Once | INTRAMUSCULAR | Status: AC | PRN
  Administered 2023-05-28: 100 mL via INTRAVENOUS

## 2023-05-28 MED ORDER — OXYCODONE HCL 5 MG/5ML PO SOLN
5.0000 mg | ORAL | Status: DC | PRN
  Administered 2023-05-28 – 2023-05-29 (×2): 5 mg
  Filled 2023-05-28 (×2): qty 5

## 2023-05-28 MED ORDER — ONDANSETRON HCL 4 MG/2ML IJ SOLN
4.0000 mg | Freq: Four times a day (QID) | INTRAMUSCULAR | Status: DC | PRN
  Administered 2023-05-28 – 2023-06-03 (×18): 4 mg via INTRAVENOUS
  Filled 2023-05-28 (×18): qty 2

## 2023-05-28 MED ORDER — LEVOTHYROXINE SODIUM 50 MCG PO TABS
50.0000 ug | ORAL_TABLET | Freq: Every day | ORAL | Status: DC
  Administered 2023-05-30 – 2023-06-03 (×5): 50 ug via ORAL
  Filled 2023-05-28 (×5): qty 1

## 2023-05-28 MED ORDER — IOHEXOL 9 MG/ML PO SOLN
30.0000 mL | ORAL | Status: AC
  Administered 2023-05-28: 30 mL via ORAL

## 2023-05-28 MED ORDER — ACETAMINOPHEN 160 MG/5ML PO SOLN: 650.0000 mg | Freq: Four times a day (QID) | ORAL | Status: DC | PRN

## 2023-05-28 MED ORDER — ARIPIPRAZOLE 2 MG PO TABS
2.0000 mg | ORAL_TABLET | Freq: Every day | ORAL | Status: DC
  Administered 2023-05-28 – 2023-06-02 (×6): 2 mg via ORAL
  Filled 2023-05-28 (×7): qty 1

## 2023-05-28 MED ORDER — PANTOPRAZOLE SODIUM 40 MG IV SOLR
40.0000 mg | Freq: Every day | INTRAVENOUS | Status: DC
  Administered 2023-05-28 – 2023-06-02 (×6): 40 mg via INTRAVENOUS
  Filled 2023-05-28 (×6): qty 10

## 2023-05-28 MED ORDER — HYDROMORPHONE HCL 1 MG/ML IJ SOLN
0.5000 mg | INTRAMUSCULAR | Status: DC | PRN
  Administered 2023-05-28 – 2023-06-03 (×26): 0.5 mg via INTRAVENOUS
  Filled 2023-05-28 (×26): qty 0.5

## 2023-05-28 MED ORDER — ENOXAPARIN SODIUM 30 MG/0.3ML IJ SOSY
30.0000 mg | PREFILLED_SYRINGE | INTRAMUSCULAR | Status: DC
  Administered 2023-05-28 – 2023-06-03 (×4): 30 mg via SUBCUTANEOUS
  Filled 2023-05-28 (×6): qty 0.3

## 2023-05-28 MED ORDER — KCL IN DEXTROSE-NACL 20-5-0.9 MEQ/L-%-% IV SOLN
INTRAVENOUS | Status: AC
  Filled 2023-05-28 (×2): qty 1000

## 2023-05-28 MED ORDER — HYDROXYZINE HCL 25 MG PO TABS
25.0000 mg | ORAL_TABLET | Freq: Every day | ORAL | Status: DC | PRN
  Administered 2023-05-28 – 2023-05-29 (×2): 25 mg via ORAL
  Filled 2023-05-28 (×2): qty 1

## 2023-05-28 MED ORDER — ONDANSETRON HCL 4 MG/2ML IJ SOLN
4.0000 mg | Freq: Once | INTRAMUSCULAR | Status: AC
  Administered 2023-05-28: 4 mg via INTRAVENOUS
  Filled 2023-05-28: qty 2

## 2023-05-28 NOTE — ED Provider Notes (Signed)
 [facility] Provider Note    Event Date/Time   First MD Initiated Contact with Patient 05/28/23 1030     (approximate)   History   Emesis   HPI  Tanya Nguyen is a 58 y.o. female who is status post bypass GJ, who comes in with nausea, vomiting.  I reviewed a note where patient was admitted on 05/12/2023 by Dr. Aleen Campi until 05/27/2023.  I reviewed the discharge summary from yesterday.  Patient had revision prior loop gastrojejunostomy into a roux en y type of gastrojejunostomy with placement of feeding jejunostomy..  States that since going home she has not tolerated any liquid and that the J-tube was not working last night and was beeping a lot.  While at home she developed acute onset nausea, vomiting, abdominal discomfort.   Family took me out of the room and did state that patient has had a lot of depression lately and that they are concerned that this stress and psychiatric issues are also causing the issues with the nausea and vomiting.  She denied any SI to the cousin who is in the room and I asked patient specifically and she did report having depression but denied any SI.  Physical Exam   Triage Vital Signs: ED Triage Vitals  Encounter Vitals Group     BP 05/28/23 1002 (!) 96/53     Systolic BP Percentile --      Diastolic BP Percentile --      Pulse Rate 05/28/23 1002 (!) 127     Resp 05/28/23 1002 17     Temp 05/28/23 1002 97.9 F (36.6 C)     Temp Source 05/28/23 1002 Oral     SpO2 05/28/23 1002 99 %     Weight 05/28/23 1003 70 lb (31.8 kg)     Height 05/28/23 1003 4\' 11"  (1.499 m)     Head Circumference --      Peak Flow --      Pain Score 05/28/23 1002 0     Pain Loc --      Pain Education --      Exclude from Growth Chart --     Most recent vital signs: Vitals:   05/28/23 1002 05/28/23 1006  BP: (!) 96/53 107/70  Pulse: (!) 127   Resp: 17   Temp: 97.9 F (36.6 C)   SpO2: 99%      General: Awake, no distress.  Frail, appears  older than stated age CV:  Good peripheral perfusion.  Tachycardic Resp:  Normal effort.  Abd:  No distention.  J tube without any erythema around it.  Slight tenderness noted Other:  Pupil on the left is greater than the right still reactive bilaterally extraocular movements are intact.   ED Results / Procedures / Treatments   Labs (all labs ordered are listed, but only abnormal results are displayed) Labs Reviewed  CBC - Abnormal; Notable for the following components:      Result Value   WBC 13.5 (*)    Hemoglobin 11.8 (*)    HCT 34.3 (*)    RDW 18.4 (*)    Platelets 864 (*)    All other components within normal limits  LIPASE, BLOOD  COMPREHENSIVE METABOLIC PANEL  URINALYSIS, ROUTINE W REFLEX MICROSCOPIC     EKG  My interpretation of EKG:  Sinus rate of 106 tachycardic no ST elevation or T wave inversions, normal intervals  RADIOLOGY I have reviewed the ct  personally and interpreted no evidence  of intracranial hemorrhage   PROCEDURES:  Critical Care performed: No  .1-3 Lead EKG Interpretation  Performed by: Concha Se, MD Authorized by: Concha Se, MD     Interpretation: abnormal     ECG rate:  110   ECG rate assessment: tachycardic     Rhythm: sinus tachycardia     Ectopy: none     Conduction: normal      MEDICATIONS ORDERED IN ED: Medications  iohexol (OMNIPAQUE) 9 MG/ML oral solution 30 mL (30 mLs Oral Contrast Given 05/28/23 1241)  HYDROmorphone (DILAUDID) injection 0.5 mg (0.5 mg Intravenous Given 05/28/23 1331)  polyethylene glycol (MIRALAX / GLYCOLAX) packet 17 g (has no administration in time range)  ondansetron (ZOFRAN-ODT) disintegrating tablet 4 mg (has no administration in time range)    Or  ondansetron (ZOFRAN) injection 4 mg (has no administration in time range)  pantoprazole (PROTONIX) injection 40 mg (has no administration in time range)  enoxaparin (LOVENOX) injection 30 mg (has no administration in time range)  dextrose 5 % and  0.9 % NaCl with KCl 20 mEq/L infusion (has no administration in time range)  oxyCODONE (ROXICODONE) 5 MG/5ML solution 5 mg (has no administration in time range)  acetaminophen (TYLENOL) 160 MG/5ML solution 650 mg (has no administration in time range)  ARIPiprazole (ABILIFY) tablet 2 mg (has no administration in time range)  hydrOXYzine (ATARAX) tablet 25 mg (has no administration in time range)  levothyroxine (SYNTHROID) tablet 50 mcg (has no administration in time range)  mirtazapine (REMERON) tablet 15 mg (has no administration in time range)  SUMAtriptan (IMITREX) tablet 50 mg (has no administration in time range)  sodium chloride 0.9 % bolus 1,000 mL (1,000 mLs Intravenous New Bag/Given 05/28/23 1113)  ondansetron (ZOFRAN) injection 4 mg (4 mg Intravenous Given 05/28/23 1111)  iohexol (OMNIPAQUE) 300 MG/ML solution 100 mL (100 mLs Intravenous Contrast Given 05/28/23 1242)     IMPRESSION / MDM / ASSESSMENT AND PLAN / ED COURSE  I reviewed the triage vital signs and the nursing notes.   Patient's presentation is most consistent with acute presentation with potential threat to life or bodily function.   Patient comes in with concerns for nausea vomiting, tachycardia.  Given recent history of abdominal surgery I discussed the case with Dr. Aleen Campi who recommended repeat CT imaging with p.o. contrast as much that she can tolerate.  There is a question of potential depression contributing to this from the family members although she is not actively having any SI no indication for IVC I think would be reasonable to consult psychiatry however I did discuss with family that a psychiatry unit would have a lot of difficulty handling the patient at this point and we need to stabilize her medically first.  Labs ordered evaluate for Electra abnormalities, AKI.  CBC shows downtrending white count hemoglobin remained stable.  Platelets are elevated could be reactive.  Lipase normal CMP shows slightly low  sodium, chloride  CT abdomen was reviewed by the surgical team Dr. Aleen Campi who is going to readmit patient asked for a new NG tube to be placed.  They are concerned about a partial obstruction from the J-tube.  I did alert him that the CT angio was still pending.  1. Normal CTA of the head and neck. No large vessel occlusion or  hemodynamically significant stenosis.  2. Mild soft tissue swelling of the left occipital scalp without  underlying fracture or foreign body.  3. Mild atrophy and white matter disease is new  since the prior  exam. This likely reflects the sequela of chronic microvascular  ischemia.  4. Degenerative changes of the cervical spine as described.     Patient will be admitted to the surgical team.  I will leave in the psychiatry consult as I do feel like patient would benefit from psychiatric team seeing her although I do not feel that she meets IVC criteria  The patient is on the cardiac monitor to evaluate for evidence of arrhythmia and/or significant heart rate changes.      FINAL CLINICAL IMPRESSION(S) / ED DIAGNOSES   Final diagnoses:  Gastric outlet obstruction     Rx / DC Orders   ED Discharge Orders     None        Note:  This document was prepared using Dragon voice recognition software and may include unintentional dictation errors.   Concha Se, MD 05/28/23 [phone]

## 2023-05-28 NOTE — ED Triage Notes (Signed)
 Pt states she has been vomiting since 8:30am today. Discharged from [facility] yesterday - had surgery on 03/06 for bowel obstruction. Pt states she had severe abd pain prior to vomiting; no c/o pain now.

## 2023-05-28 NOTE — BH Assessment (Signed)
 TTS and Psych NP unable to complete consult. Patient transferred to the medical floor.

## 2023-05-28 NOTE — H&P (Signed)
 Date of Admission:  05/28/2023  Reason for Admission:  Abdominal distention, nausea, vomiting.  History of Present Illness: Tanya Nguyen is a 58 y.o. female s/p recent revision of prior loop gastrojejunostomy with omega loop into a RNY type gastrojejunostomy and insertion of feeding jejunostomy on 05/12/23.  Her post-operative course was slow with slow progression of contrast into the gastrojejunostomy and past the feeding jejunostomy.  Eventually, her NG output was lower and she passed multiple NG clamping trials and subsequent advancement of diet to clears and then to full liquids.  She was also tolerating J tube feeds and these were transitioned to nocturnal feeds.  She was discharged on 05/27/23.  The patient reports that the same night at home, the family was having issues with the feeding pump beeping all the time, and her feeling more distended and nauseous and had episode of large emesis.    In the ED, she had workup which showed a WBC of 13.5 improved from 16.9 on 05/24/23, albumin 2.0, Cr 0.71, K 3.7.  She had a CT scan of abdomen/pelvis which again showed distention of the stomach and gastrojejunostomy and proximal bowel loops past the jejunojejunostomy.  Past Medical History: Past Medical History:  Diagnosis Date   Acquired pyloric stricture    Bipolar disorder (HCC)    Bleeding duodenal ulcer    CAP (community acquired pneumonia) 02/12/2020   Chronic esophagogastric ulcer    Chronic low back pain    Chronic pain syndrome    Constipation 04/02/2014   Depression    Duodenal obstruction    Gastric outlet obstruction 01/19/2018   GERD (gastroesophageal reflux disease)    Hair loss 09/22/2018   Head injury 12/24/2022   History of stomach ulcers    Hypothyroidism    IDA (iron deficiency anemia)    Incisional hernia, without obstruction or gangrene    Intractable vomiting    Kidney stones    Melena 05/08/2021   Migraine    Neuropathy    Nodule of middle lobe of right lung  09/17/2021   NSAID long-term use 09/16/2016   Pre-diabetes    Protein-calorie malnutrition, severe (HCC)    PTSD (post-traumatic stress disorder)    Right-sided chest pain 09/17/2021   Sacroiliac joint pain 08/14/2018   Side pain 03/30/2018   Small bowel anastomotic dilation    Thrombocytosis    Upper GI bleed 05/18/2021   Vitamin B12 deficiency    Vitamin D deficiency      Past Surgical History: Past Surgical History:  Procedure Laterality Date   ABDOMINAL HYSTERECTOMY  03/08/2002   partial   bypass gastrojejunostomy     CHOLECYSTECTOMY     COLONOSCOPY WITH PROPOFOL N/A 02/27/2018   Procedure: COLONOSCOPY WITH PROPOFOL;  Surgeon: Midge Minium, MD;  Location: [facility];  Service: Endoscopy;  Laterality: N/A;   ESOPHAGOGASTRODUODENOSCOPY (EGD) WITH PROPOFOL N/A 01/12/2018   Procedure: ESOPHAGOGASTRODUODENOSCOPY (EGD) WITH BIOPSIES;  Surgeon: Midge Minium, MD;  Location: [facility];  Service: Endoscopy;  Laterality: N/A;   ESOPHAGOGASTRODUODENOSCOPY (EGD) WITH PROPOFOL N/A 01/25/2018   Procedure: ESOPHAGOGASTRODUODENOSCOPY (EGD) WITH PROPOFOL;  Surgeon: Wyline Mood, MD;  Location: [facility] ENDOSCOPY;  Service: Gastroenterology;  Laterality: N/A;   ESOPHAGOGASTRODUODENOSCOPY (EGD) WITH PROPOFOL N/A 02/27/2018   Procedure: ESOPHAGOGASTRODUODENOSCOPY (EGD) WITH PROPOFOL;  Surgeon: Midge Minium, MD;  Location: [facility];  Service: Endoscopy;  Laterality: N/A;   ESOPHAGOGASTRODUODENOSCOPY (EGD) WITH PROPOFOL N/A 05/09/2021   Procedure: ESOPHAGOGASTRODUODENOSCOPY (EGD) WITH PROPOFOL;  Surgeon: Jaynie Collins, DO;  Location: [facility] ENDOSCOPY;  Service: Gastroenterology;  Laterality: N/A;   ESOPHAGOGASTRODUODENOSCOPY (EGD) WITH PROPOFOL N/A 03/09/2022   Procedure: ESOPHAGOGASTRODUODENOSCOPY (EGD) WITH PROPOFOL;  Surgeon: Midge Minium, MD;  Location: [facility] ENDOSCOPY;  Service: Endoscopy;  Laterality: N/A;   ESOPHAGOGASTRODUODENOSCOPY (EGD) WITH PROPOFOL N/A  03/30/2023   Procedure: ESOPHAGOGASTRODUODENOSCOPY (EGD) WITH PROPOFOL;  Surgeon: Midge Minium, MD;  Location: [facility] ENDOSCOPY;  Service: Endoscopy;  Laterality: N/A;   GASTROJEJUNOSTOMY N/A 05/12/2023   Procedure: GASTROJEJUNOSTOMY, revision, Lynden Oxford, PA-C to assist, possible feeding jejunostomy;  Surgeon: Henrene Dodge, MD;  Location: [facility] ORS;  Service: General;  Laterality: N/A;   INCISIONAL HERNIA REPAIR N/A 11/23/2018   Procedure: HERNIA REPAIR INCISIONAL;  Surgeon: Henrene Dodge, MD;  Location: [facility] ORS;  Service: General;  Laterality: N/A;   INSERTION OF MESH N/A 11/23/2018   Procedure: INSERTION OF MESH;  Surgeon: Henrene Dodge, MD;  Location: [facility] ORS;  Service: General;  Laterality: N/A;   LAPAROTOMY N/A 01/20/2018   Procedure: EXPLORATORY LAPAROTOMY;  Surgeon: Henrene Dodge, MD;  Location: [facility] ORS;  Service: General;  Laterality: N/A;   LYSIS OF ADHESION N/A 05/12/2023   Procedure: LYSIS OF ADHESION, open, Lynden Oxford, PA-C to assist;  Surgeon: Henrene Dodge, MD;  Location: [facility] ORS;  Service: General;  Laterality: N/A;   TOOTH EXTRACTION      Home Medications: Prior to Admission medications   Medication Sig Start Date End Date Taking? Authorizing Provider  acetaminophen (TYLENOL) 500 MG tablet Take 500-1,000 mg by mouth every 6 (six) hours as needed for moderate pain (pain score 4-6) or headache.   Yes [provider]  ARIPiprazole (ABILIFY) 2 MG tablet Take 1 tablet (2 mg total) by mouth at bedtime. 05/11/23 08/09/23 Yes Jomarie Longs, MD  bisacodyl (DULCOLAX) 5 MG EC tablet Take 5 mg by mouth daily as needed for moderate constipation.   Yes [provider]  diphenhydrAMINE (BENADRYL) 25 MG tablet Take 25 mg by mouth every 6 (six) hours as needed for allergies.   Yes [provider]  hydrOXYzine (ATARAX) 25 MG tablet Take 1 tablet (25 mg total) by mouth daily as needed for anxiety. 05/06/23 08/04/23 Yes Neysa Hotter, MD  levothyroxine (SYNTHROID) 50  MCG tablet Take 1 tablet by mouth every morning on an empty stomach with water only.  No food or other medications for 30 minutes. 05/02/23  Yes Doreene Nest, NP  mirtazapine (REMERON) 15 MG tablet Take 1 tablet (15 mg total) by mouth at bedtime. 05/11/23  Yes Jomarie Longs, MD  Multiple Vitamin (MULTIVITAMIN WITH MINERALS) TABS tablet Take 1 tablet by mouth daily. 04/01/23  Yes Esaw Grandchild A, DO  ondansetron (ZOFRAN-ODT) 4 MG disintegrating tablet Take 1 tablet (4 mg total) by mouth every 6 (six) hours as needed for nausea. 05/27/23  Yes Lynden Oxford R, PA-C  oxybutynin (DITROPAN-XL) 5 MG 24 hr tablet TAKE 1 TABLET (5 MG TOTAL) BY MOUTH AT BEDTIME. FOR BLADDER INCONTINENCE. 01/21/23  Yes Doreene Nest, NP  oxyCODONE (ROXICODONE) 5 MG/5ML solution Take 5 mLs (5 mg total) by mouth every 4 (four) hours as needed for up to 14 days for moderate pain (pain score 4-6) or severe pain (pain score 7-10). 05/27/23 06/10/23 Yes Donovan Kail, PA-C  pantoprazole (PROTONIX) 40 MG tablet Take 1 tablet (40 mg total) by mouth daily. 05/04/23 04/28/24 Yes Celso Amy, PA-C  SUMAtriptan (IMITREX) 50 MG tablet Take 1 tablet at migraine onset. May repeat in 2 hours if headache persists or recurs. 03/08/23  Yes Doreene Nest, NP  traMADol (ULTRAM) 50 MG tablet Take 1 tablet (50 mg total) by mouth every 12 (twelve) hours as needed. 02/01/23  Yes Edward Jolly, MD    Allergies: Allergies  Allergen Reactions   Penicillins Shortness Of Breath, Diarrhea and Nausea And Vomiting    TOLERATED CEPHALOSPORINS   Nsaids Other (See Comments)    Pt states NSAIDS cause "internal bleeding"   Gabapentin Other (See Comments)    Hair Loss    Pregabalin     Tremors   Sulfa Antibiotics Nausea And Vomiting    Social History:  reports that she has never smoked. She has never been exposed to tobacco smoke. She has never used smokeless tobacco. She reports that she does not currently use alcohol. She reports that  she does not use drugs.   Family History: Family History  Problem Relation Age of Onset   Cancer Mother    Breast cancer Mother 50   Lung cancer Father    Drug abuse Sister    Alcohol abuse Sister    Bipolar disorder Sister    Alcohol abuse Maternal Grandmother    Stroke Maternal Grandmother    Stroke Paternal Grandmother     Review of Systems: Review of Systems  Constitutional:  Negative for chills and fever.  HENT:  Negative for hearing loss.   Respiratory:  Negative for shortness of breath.   Cardiovascular:  Negative for chest pain.  Gastrointestinal:  Positive for abdominal pain, nausea and vomiting.  Genitourinary:  Negative for dysuria.  Musculoskeletal:  Negative for myalgias.  Skin:  Negative for rash.  Neurological:  Negative for dizziness.  Psychiatric/Behavioral:  Negative for depression.     Physical Exam BP 130/86 (BP Location: Right Arm)   Pulse (!) 109   Temp 98 F (36.7 C)   Resp 18   Ht 4\' 11"  (1.499 m)   Wt 31.8 kg   SpO2 96%   BMI 14.14 kg/m  CONSTITUTIONAL: No acute distress HEENT:  Normocephalic, atraumatic, extraocular motion intact. NECK: Trachea is midline, and there is no jugular venous distension.  RESPIRATORY:  Normal respiratory effort without pathologic use of accessory muscles. CARDIOVASCULAR: Regular rhythm and rate. GI: The abdomen is soft, mildly distended, appropriately tender.  Midline incision without any erythema or induration.  J tube in place.  MUSCULOSKELETAL:  Normal muscle strength and tone in all four extremities.  No peripheral edema or cyanosis. SKIN: Skin turgor is normal. There are no pathologic skin lesions.  NEUROLOGIC:  Motor and sensation is grossly normal.  Cranial nerves are grossly intact. PSYCH:  Alert and oriented to person, place and time. Affect is normal.  Laboratory Analysis: Results for orders placed or performed during the hospital encounter of 05/28/23 (from the past 24 hours)  Lipase, blood      Status: None   Collection Time: 05/28/23 10:05 AM  Result Value Ref Range   Lipase 31 11 - 51 U/L  Comprehensive metabolic panel     Status: Abnormal   Collection Time: 05/28/23 10:05 AM  Result Value Ref Range   Sodium 131 (L) 135 - 145 mmol/L   Potassium 3.7 3.5 - 5.1 mmol/L   Chloride 96 (L) 98 - 111 mmol/L   CO2 29 22 - 32 mmol/L   Glucose, Bld 132 (H) 70 - 99 mg/dL   BUN 15 6 - 20 mg/dL   Creatinine, Ser 1.61 0.44 - 1.00 mg/dL   Calcium 8.0 (L) 8.9 - 10.3 mg/dL   Total Protein 5.4 (L) 6.5 -  8.1 g/dL   Albumin 2.0 (L) 3.5 - 5.0 g/dL   AST 22 15 - 41 U/L   ALT 19 0 - 44 U/L   Alkaline Phosphatase 114 38 - 126 U/L   Total Bilirubin 0.7 0.0 - 1.2 mg/dL   GFR, Estimated >16 >10 mL/min   Anion gap 6 5 - 15  CBC     Status: Abnormal   Collection Time: 05/28/23 10:05 AM  Result Value Ref Range   WBC 13.5 (H) 4.0 - 10.5 K/uL   RBC 3.87 3.87 - 5.11 MIL/uL   Hemoglobin 11.8 (L) 12.0 - 15.0 g/dL   HCT 96.0 (L) 45.4 - 09.8 %   MCV 88.6 80.0 - 100.0 fL   MCH 30.5 26.0 - 34.0 pg   MCHC 34.4 30.0 - 36.0 g/dL   RDW 11.9 (H) 14.7 - 82.9 %   Platelets 864 (H) 150 - 400 K/uL   nRBC 0.0 0.0 - 0.2 %    Imaging: DG Chest Portable 1 View Result Date: 05/28/2023 CLINICAL DATA:  Nasogastric tube placement EXAM: PORTABLE CHEST 1 VIEW COMPARISON:  09/17/2021 FINDINGS: The nasogastric tube side port is in the stomach body with tip in the stomach fundus satisfactorily positioned. The lungs appear clear. Cardiac and mediastinal contours normal. No blunting of the costophrenic angles. IMPRESSION: 1. Satisfactory positioning of the nasogastric tube. Electronically Signed   By: Gaylyn Rong M.D.   On: 05/28/2023 14:52   CT ABDOMEN PELVIS W CONTRAST Result Date: 05/28/2023 CLINICAL DATA:  Acute abdominal pain and vomiting beginning this morning. Postop from lysis of adhesions, new Roux EN Y gastrojejunostomy and feeding jejunostomy. EXAM: CT ABDOMEN AND PELVIS WITH CONTRAST TECHNIQUE:  Multidetector CT imaging of the abdomen and pelvis was performed using the standard protocol following bolus administration of intravenous contrast. RADIATION DOSE REDUCTION: This exam was performed according to the departmental dose-optimization program which includes automated exposure control, adjustment of the mA and/or kV according to patient size and/or use of iterative reconstruction technique. CONTRAST:  OMNIPAQUE IOHEXOL 300 MG/ML  SOLN COMPARISON:  05/19/2023 FINDINGS: Lower Chest: No acute findings. Hepatobiliary: No suspicious hepatic masses identified. Small right and left hepatic lobe cysts are stable. Prior cholecystectomy. No evidence of biliary obstruction. Pancreas:  No mass or inflammatory changes. Spleen: Within normal limits in size and appearance. Adrenals/Urinary Tract: No suspicious masses identified. No evidence of ureteral calculi or hydronephrosis. Unremarkable unopacified urinary bladder. Stomach/Bowel: Prior Roux-en-Y gastrojejunostomy again seen. Markedly dilated and fluid-filled small bowel a both the efferent and afferent limbs is seen to the level of the jejuno jejunostomy anastomosis in the left lower quadrant. Degree of dilatation is increased since previous study and consistent with small bowel obstruction. Feeding jejunostomy remains in place with tip in the right mid abdomen. A Vascular/Lymphatic: No pathologically enlarged lymph nodes. No acute vascular findings. Reproductive: Prior hysterectomy. No adnexal masses are identified. Small amount of free fluid noted in pelvic cul-de-sac. Other:  None. Musculoskeletal:  No suspicious bone lesions identified. IMPRESSION: Worsening small bowel obstruction, with transition point near the jejunojejunostomy anastomosis in the left lower quadrant. Small amount of free fluid in pelvic cul-de-sac. Electronically Signed   By: Danae Orleans M.D.   On: 05/28/2023 14:17   CT ANGIO HEAD NECK W WO CM Result Date: 05/28/2023 CLINICAL DATA:   Pupil asymmetry. Vomiting today. Patient was [facility] yesterday following surgery for bowel obstruction 05/12/2023. EXAM: CT ANGIOGRAPHY HEAD AND NECK WITH AND WITHOUT CONTRAST TECHNIQUE: Multidetector CT  imaging of the head and neck was performed using the standard protocol during bolus administration of intravenous contrast. Multiplanar CT image reconstructions and MIPs were obtained to evaluate the vascular anatomy. Carotid stenosis measurements (when applicable) are obtained utilizing NASCET criteria, using the distal internal carotid diameter as the denominator. RADIATION DOSE REDUCTION: This exam was performed according to the departmental dose-optimization program which includes automated exposure control, adjustment of the mA and/or kV according to patient size and/or use of iterative reconstruction technique. CONTRAST:  OMNIPAQUE IOHEXOL 300 MG/ML  SOLN COMPARISON:  CT head without contrast 11/27/2010 FINDINGS: CT HEAD FINDINGS Brain: Mild atrophy and white matter changes are new since the prior exam. No acute infarct, hemorrhage, or mass lesion is present. The ventricles are of normal size. No significant extraaxial fluid collection is present. Midline structures are within normal limits. The brainstem and cerebellum are within normal limits. Vascular: No hyperdense vessel or unexpected calcification. Skull: Mild soft tissue swelling is present the left occipital scalp. No underlying fracture or foreign body is present. Sinuses/Orbits: The paranasal sinuses and mastoid air cells are clear. The globes and orbits are within normal limits. Review of the MIP images confirms the above findings CTA NECK FINDINGS Aortic arch: Common origin of the left common carotid artery and innominate artery is noted. Calcifications are present at the origins the great vessels. No focal stenosis is present. No aneurysm or dissection is present. Right carotid system: The right common  carotid artery is within normal limits. Bifurcation is unremarkable. Cervical right ICA is normal. Left carotid system: Left common carotid artery is within normal limits. Bifurcation is unremarkable. The cervical left ICA is normal. Vertebral arteries: Right vertebral artery is the dominant vessel. Both vertebral arteries originate from the subclavian arteries without significant stenosis. No significant stenosis is present in either vertebral artery in the neck. Skeleton: Mild degenerative changes are present in the cervical spine. Uncovertebral spurring contributes to foraminal narrowing at C5-6 and C6-7, right greater than left. Other neck: The soft tissues of the neck are otherwise unremarkable. Salivary glands are within normal limits. Thyroid is normal. No significant adenopathy is present. No focal mucosal or submucosal lesions are present. Upper chest: The lung apices are clear. Review of the MIP images confirms the above findings CTA HEAD FINDINGS Anterior circulation: The internal carotid arteries are within normal limits from high cervical segments through the ICA termini. The A1 and M1 segments are normal. The MCA bifurcations are within normal limits. The anterior communicating artery is patent. The ACA and MCA branch vessels are within normal limits. No aneurysm is present. Posterior circulation: The PICA origins are visualized and normal. The left posterior scratched at the left vertebral artery is centrally terminates at the PICA. The basilar artery is diminutive, terminating at the superior cerebellar arteries, a normal variant. The posterior cerebral arteries are of fetal type bilaterally. Small P1 segments are noted. The PCA branch vessels are within normal limits bilaterally. Venous sinuses: The dural sinuses are patent. The straight sinus and deep cerebral veins are intact. Cortical veins are within normal limits. No significant vascular malformation is evident. Anatomic variants: Fetal type  posterior cerebral arteries bilaterally. Review of the MIP images confirms the above findings IMPRESSION: 1. Normal CTA of the head and neck. No large vessel occlusion or hemodynamically significant stenosis. 2. Mild soft tissue swelling of the left occipital scalp without underlying fracture or foreign body. 3. Mild atrophy and white matter disease is new since the prior exam. This likely  reflects the sequela of chronic microvascular ischemia. 4. Degenerative changes of the cervical spine as described. Electronically Signed   By: Marin Roberts M.D.   On: 05/28/2023 13:49    Assessment and Plan: This is a 58 y.o. female with abdominal distention, nausea, vomiting.  --Discussed with the patient the findings on her imaging.  I think again the J tube may be contributing to partial obstruction of the small bowel.  The jejunojejunostomy itself is distended, but so is the common channel leading up to the jejunostomy feeding tube.  At this point, we cannot remove the J tube as the track has not matured yet (she is 2 weeks post-op only).  In the ER, she had an NG tube placed to help decompress the proximal bowel and about 1100 ml of bilious fluid was suctioned right away.  Will continue NG to suction tonight, hold her J tube feeds tonight as a precaution.  Will obtain KUB tomorrow to reassess and she could potentially resume J tube feeds tomorrow. --From gastric standpoint, given the need for NG tube, I think it would be best now to proceed with percutaneous G tube and will ask IR on Monday 3/24 about possible placement.  This would allow for venting of her stomach as needed while being able to take clears or full liquids for comfort as tolerated.  She would continue J tube feeds  --No urgent/emergent surgical need at this point.  Plan discussed with the patient and all of her questions have been answered.  I spent 75 minutes dedicated to the care of this patient on the date of this encounter to include  pre-visit review of records, face-to-face time with the patient discussing diagnosis and management, and any post-visit coordination of care.   Howie Ill, MD [facility]:  [phone]

## 2023-05-28 NOTE — ED Notes (Signed)
Patient given PO contrast.

## 2023-05-28 NOTE — Progress Notes (Addendum)
 Nutrition Follow Up Note   DOCUMENTATION CODES:   Severe malnutrition in context of chronic illness  INTERVENTION:   Nocturnal tube feeds of Osmolite 1.2 @85ml /hr x 14 hours overnight (from 1800-0800)- start 3/23  Free water flushes 30ml q4 hours to maintain tube patency   Regimen provides 1428kcal/day, 66g/day protein and 1165ml/day of free water.   Vitamin A 10,000 units po daily with diet advancement   Zinc 220mg  po daily with diet advancement   Daily weights   Will recheck copper lab as this was supplemented last admission.   NUTRITION DIAGNOSIS:   Severe Malnutrition related to chronic illness as evidenced by moderate fat depletion, severe muscle depletion, 27 percent weight loss in 6 months. -diagnosed from exam on 3/18  GOAL:   Patient will meet greater than or equal to 90% of their needs  MONITOR:   Diet advancement, Labs, Weight trends, TF tolerance, Skin, I & O's  ASSESSMENT:   58 y/o female with h/o chronic pain, GERD, open cholecystectomy, IBS-C, thyroid disease, depression, anxiety, PTSD, bipolar disorder, SI, peripheral neuropathy, IDA, PUD, kindney stones and GOO s/p exploratory laparotomy (with gastrojejunostomy with omega loop/jejunojejunostomy 01/2018) complicated by incisional hernia s/p open incisional hernia repair with mesh underlay 11/2018 and recent admission for malfunctioning gastrojejunostomy s/p exploratory laparotomy with lysis of adhesions, resection of previous gastrojejunostomy and jejunojejunostomy, creation of new Roux-en-Y gastrojejunostomy and new feeding jejunostomy tube (55F) 3/6 and who is now admitted with SBO.  Pt is well known to this RD from her recent previous admission. Pt discharged yesterday but re-admitted today secondary to nausea and vomiting. Plan is for placement of a venting G-tube. Will plan to resume tube feeds via J-tube tomorrow per surgery. Pt is likely at refeed risk. No new weight this admission. Pt with numerous  vitamin deficiencies noted; will provide supplementation once oral diet resumes.     Medications reviewed and include: lovenox, synthroid, remeron, protonix, NaCl w/ 5% dextrose & Kcl @75ml /hr  Labs reviewed: Na 131(L), K 3.7 wnl Folate 11.3 wnl, vitamin D 71.09 wnl, vitamin A 13.5(L), vitamin E 7.5 wnl, B1 113.2 wnl, B12 3,517(H), zinc 18(L), copper 26 (L)- 3/6 Wbc- 13.5(H)  Nutrition Focused Physical Exam from 3/18:  Flowsheet Row Most Recent Value  Orbital Region Mild depletion  Upper Arm Region Moderate depletion  Thoracic and Lumbar Region Moderate depletion  Buccal Region Mild depletion  Temple Region Moderate depletion  Clavicle Bone Region Severe depletion  Clavicle and Acromion Bone Region Severe depletion  Scapular Bone Region Moderate depletion  Dorsal Hand Mild depletion  Patellar Region Severe depletion  Anterior Thigh Region Severe depletion  Posterior Calf Region Severe depletion  Edema (RD Assessment) None  Hair Reviewed  Eyes Reviewed  Mouth Reviewed  Skin Reviewed  Nails Reviewed   Diet Order:   Diet Order             Diet NPO time specified Except for: Ice Chips  Diet effective now                  EDUCATION NEEDS:   Education needs have been addressed  Skin:  Skin Assessment: Reviewed RN Assessment (incision abdomen)  Last BM:  3/21  Height:   Ht Readings from Last 1 Encounters:  05/28/23 4\' 11"  (1.499 m)    Weight:   Wt Readings from Last 1 Encounters:  05/28/23 31.8 kg    Ideal Body Weight:  44.5 kg  Estimated Nutritional Needs:   Kcal:  1200-1400kcal/day  Protein:  60-70g/day  Fluid:  1.1-1.3L/day  Betsey Holiday MS, RD, LDN If unable to be reached, please send secure chat to "RD inpatient" available from 8:00a-4:00p daily

## 2023-05-28 NOTE — ED Notes (Addendum)
Patient's family at the bedside

## 2023-05-28 NOTE — ED Notes (Signed)
 This RN notified Artis Delay, MD of patient's request for pain medicine.

## 2023-05-28 NOTE — ED Notes (Addendum)
 14 french NGT placed in right nare by Henrene Dodge, MD. Placement verified by presence of gastric secretions and xray. NGT connected to low intermittent suction.

## 2023-05-28 NOTE — Progress Notes (Deleted)
 BH MD/PA/NP OP Progress Note  05/28/2023 6:26 PM Tanya Nguyen  MRN:  [id number]  Chief Complaint: No chief complaint on file.  HPI: *** Visit Diagnosis: No diagnosis found.  Past Psychiatric History: ***  Past Medical History:  Past Medical History:  Diagnosis Date   Acquired pyloric stricture    Bipolar disorder (HCC)    Bleeding duodenal ulcer    CAP (community acquired pneumonia) 02/12/2020   Chronic esophagogastric ulcer    Chronic low back pain    Chronic pain syndrome    Constipation 04/02/2014   Depression    Duodenal obstruction    Gastric outlet obstruction 01/19/2018   GERD (gastroesophageal reflux disease)    Hair loss 09/22/2018   Head injury 12/24/2022   History of stomach ulcers    Hypothyroidism    IDA (iron deficiency anemia)    Incisional hernia, without obstruction or gangrene    Intractable vomiting    Kidney stones    Melena 05/08/2021   Migraine    Neuropathy    Nodule of middle lobe of right lung 09/17/2021   NSAID long-term use 09/16/2016   Pre-diabetes    Protein-calorie malnutrition, severe (HCC)    PTSD (post-traumatic stress disorder)    Right-sided chest pain 09/17/2021   Sacroiliac joint pain 08/14/2018   Side pain 03/30/2018   Small bowel anastomotic dilation    Thrombocytosis    Upper GI bleed 05/18/2021   Vitamin B12 deficiency    Vitamin D deficiency     Past Surgical History:  Procedure Laterality Date   ABDOMINAL HYSTERECTOMY  03/08/2002   partial   bypass gastrojejunostomy     CHOLECYSTECTOMY     COLONOSCOPY WITH PROPOFOL N/A 02/27/2018   Procedure: COLONOSCOPY WITH PROPOFOL;  Surgeon: Midge Minium, MD;  Location: [facility];  Service: Endoscopy;  Laterality: N/A;   ESOPHAGOGASTRODUODENOSCOPY (EGD) WITH PROPOFOL N/A 01/12/2018   Procedure: ESOPHAGOGASTRODUODENOSCOPY (EGD) WITH BIOPSIES;  Surgeon: Midge Minium, MD;  Location: [facility];  Service: Endoscopy;  Laterality: N/A;   ESOPHAGOGASTRODUODENOSCOPY  (EGD) WITH PROPOFOL N/A 01/25/2018   Procedure: ESOPHAGOGASTRODUODENOSCOPY (EGD) WITH PROPOFOL;  Surgeon: Wyline Mood, MD;  Location: [facility] END[facility]COPY;  Service: Gastroenterology;  Laterality: N/A;   ESOPHAGOGASTRODUODENOSCOPY (EGD) WITH PROPOFOL N/A 02/27/2018   Procedure: ESOPHAGOGASTRODUODENOSCOPY (EGD) WITH PROPOFOL;  Surgeon: Midge Minium, MD;  Location: [facility];  Service: Endoscopy;  Laterality: N/A;   ESOPHAGOGASTRODUODENOSCOPY (EGD) WITH PROPOFOL N/A 05/09/2021   Procedure: ESOPHAGOGASTRODUODENOSCOPY (EGD) WITH PROPOFOL;  Surgeon: Jaynie Collins, DO;  Location: [facility] ENDOSCOPY;  Service: Gastroenterology;  Laterality: N/A;   ESOPHAGOGASTRODUODENOSCOPY (EGD) WITH PROPOFOL N/A 03/09/2022   Procedure: ESOPHAGOGASTRODUODENOSCOPY (EGD) WITH PROPOFOL;  Surgeon: Midge Minium, MD;  Location: [facility] ENDOSCOPY;  Service: Endoscopy;  Laterality: N/A;   ESOPHAGOGASTRODUODENOSCOPY (EGD) WITH PROPOFOL N/A 03/30/2023   Procedure: ESOPHAGOGASTRODUODENOSCOPY (EGD) WITH PROPOFOL;  Surgeon: Midge Minium, MD;  Location: [facility] ENDOSCOPY;  Service: Endoscopy;  Laterality: N/A;   GASTROJEJUNOSTOMY N/A 05/12/2023   Procedure: GASTROJEJUNOSTOMY, revision, Lynden Oxford, PA-C to assist, possible feeding jejunostomy;  Surgeon: Henrene Dodge, MD;  Location: [facility] ORS;  Service: General;  Laterality: N/A;   INCISIONAL HERNIA REPAIR N/A 11/23/2018   Procedure: HERNIA REPAIR INCISIONAL;  Surgeon: Henrene Dodge, MD;  Location: [facility] ORS;  Service: General;  Laterality: N/A;   INSERTION OF MESH N/A 11/23/2018   Procedure: INSERTION OF MESH;  Surgeon: Henrene Dodge, MD;  Location: [facility] ORS;  Service: General;  Laterality: N/A;   LAPAROTOMY N/A 01/20/2018   Procedure: EXPLORATORY LAPAROTOMY;  Surgeon:  Henrene Dodge, MD;  Location: [facility] ORS;  Service: General;  Laterality: N/A;   LYSIS OF ADHESION N/A 05/12/2023   Procedure: LYSIS OF ADHESION, open, Lynden Oxford, PA-C to assist;  Surgeon: Henrene Dodge, MD;   Location: [facility] ORS;  Service: General;  Laterality: N/A;   TOOTH EXTRACTION      Family Psychiatric History: ***  Family History:  Family History  Problem Relation Age of Onset   Cancer Mother    Breast cancer Mother 74   Lung cancer Father    Drug abuse Sister    Alcohol abuse Sister    Bipolar disorder Sister    Alcohol abuse Maternal Grandmother    Stroke Maternal Grandmother    Stroke Paternal Grandmother     Social History:  Social History   Socioeconomic History   Marital status: Married    Spouse name: Not on file   Number of children: 0   Years of education: 12   Highest education level: High school graduate  Occupational History   Occupation: Homemaker  Tobacco Use   Smoking status: Never    Passive exposure: Never   Smokeless tobacco: Never  Vaping Use   Vaping status: Never Used  Substance and Sexual Activity   Alcohol use: Not Currently    Alcohol/week: 0.0 standard drinks of alcohol   Drug use: No   Sexual activity: Not Currently    Birth control/protection: None  Other Topics Concern   Not on file  Social History Narrative   Lives at home with her husband.   Right-handed.   Rare caffeine.   Social Drivers of Corporate investment banker Strain: Not on file  Food Insecurity: No Food Insecurity (05/13/2023)   Hunger Vital Sign    Worried About Running Out of Food in the Last Year: Never true    Ran Out of Food in the Last Year: Never true  Transportation Needs: No Transportation Needs (05/13/2023)   PRAPARE - Administrator, Civil Service (Medical): No    Lack of Transportation (Non-Medical): No  Physical Activity: Not on file  Stress: Not on file  Social Connections: Socially Integrated (03/30/2023)   Social Connection and Isolation Panel [NHANES]    Frequency of Communication with Friends and Family: Three times a week    Frequency of Social Gatherings with Friends and Family: Never    Attends Religious Services: More than 4 times  per year    Active Member of Clubs or Organizations: Yes    Attends Engineer, structural: More than 4 times per year    Marital Status: Married    Allergies:  Allergies  Allergen Reactions   Penicillins Shortness Of Breath, Diarrhea and Nausea And Vomiting    TOLERATED CEPHALOSPORINS   Nsaids Other (See Comments)    Pt states NSAIDS cause "internal bleeding"   Gabapentin Other (See Comments)    Hair Loss    Pregabalin     Tremors   Sulfa Antibiotics Nausea And Vomiting    Metabolic Disorder Labs: Lab Results  Component Value Date   HGBA1C 6.1 12/29/2022   No results found for: "PROLACTIN" Lab Results  Component Value Date   CHOL 152 12/29/2022   TRIG 57.0 12/29/2022   HDL 62.10 12/29/2022   CHOLHDL 2 12/29/2022   VLDL 11.4 12/29/2022   LDLCALC 79 12/29/2022   LDLCALC 94 11/23/2021   Lab Results  Component Value Date   TSH 235.50 Verified by dilution (H) 05/02/2023   TSH  0.027 (L) 03/29/2023    Therapeutic Level Labs: Lab Results  Component Value Date   LITHIUM 0.84 03/03/2015   No results found for: "VALPROATE" No results found for: "CBMZ"  Current Medications: No current facility-administered medications for this visit.   No current outpatient medications on file.   Facility-Administered Medications Ordered in Other Visits  Medication Dose Route Frequency Provider Last Rate Last Admin   acetaminophen (TYLENOL) 160 MG/5ML solution 650 mg  650 mg Per Tube Q6H PRN Piscoya, Jose, MD       ARIPiprazole (ABILIFY) tablet 2 mg  2 mg Oral QHS Piscoya, Jose, MD       dextrose 5 % and 0.9 % NaCl with KCl 20 mEq/L infusion   Intravenous Continuous Piscoya, Jose, MD 75 mL/hr at 05/28/23 1606 New Bag at 05/28/23 1606   [START ON 05/29/2023] enoxaparin (LOVENOX) injection 30 mg  30 mg Subcutaneous Q24H Piscoya, Jose, MD       [START ON 05/29/2023] feeding supplement (OSMOLITE 1.2 CAL) liquid 1,190 mL  1,190 mL Per Tube Q24H Piscoya, Jose, MD       free water 30  mL  30 mL Per Tube Q4H Piscoya, Jose, MD       HYDROmorphone (DILAUDID) injection 0.5 mg  0.5 mg Intravenous Q4H PRN Piscoya, Jose, MD   0.5 mg at 05/28/23 1811   hydrOXYzine (ATARAX) tablet 25 mg  25 mg Oral Daily PRN Henrene Dodge, MD   25 mg at 05/28/23 1811   [START ON 05/29/2023] levothyroxine (SYNTHROID) tablet 50 mcg  50 mcg Oral Q0600 Piscoya, Elita Quick, MD       mirtazapine (REMERON) tablet 15 mg  15 mg Oral QHS Piscoya, Jose, MD       ondansetron (ZOFRAN-ODT) disintegrating tablet 4 mg  4 mg Oral Q6H PRN Piscoya, Jose, MD       Or   ondansetron (ZOFRAN) injection 4 mg  4 mg Intravenous Q6H PRN Piscoya, Jose, MD       oxyCODONE (ROXICODONE) 5 MG/5ML solution 5 mg  5 mg Per Tube Q4H PRN Piscoya, Jose, MD       pantoprazole (PROTONIX) injection 40 mg  40 mg Intravenous QHS Piscoya, Jose, MD       polyethylene glycol (MIRALAX / GLYCOLAX) packet 17 g  17 g Oral Daily PRN Piscoya, Jose, MD       SUMAtriptan (IMITREX) tablet 50 mg  50 mg Oral Q2H PRN Henrene Dodge, MD         Musculoskeletal: Strength & Muscle Tone: {desc; muscle tone:32375} Gait & Station: {PE GAIT ED ZOXW:96045} Patient leans: {Patient Leans:21022755}  Psychiatric Specialty Exam: Review of Systems  There were no vitals taken for this visit.There is no height or weight on file to calculate BMI.  General Appearance: {Appearance:22683}  Eye Contact:  {BHH EYE CONTACT:22684}  Speech:  {Speech:22685}  Volume:  {Volume (PAA):22686}  Mood:  {BHH MOOD:22306}  Affect:  {Affect (PAA):22687}  Thought Process:  {Thought Process (PAA):22688}  Orientation:  {BHH ORIENTATION (PAA):22689}  Thought Content: {Thought Content:22690}   Suicidal Thoughts:  {ST/HT (PAA):22692}  Homicidal Thoughts:  {ST/HT (PAA):22692}  Memory:  {BHH MEMORY:22881}  Judgement:  {Judgement (PAA):22694}  Insight:  {Insight (PAA):22695}  Psychomotor Activity:  {Psychomotor (PAA):22696}  Concentration:  {Concentration:21399}  Recall:  {BHH  GOOD/FAIR/POOR:22877}  Fund of Knowledge: {BHH GOOD/FAIR/POOR:22877}  Language: {BHH GOOD/FAIR/POOR:22877}  Akathisia:  {BHH YES OR NO:22294}  Handed:  {Handed:22697}  AIMS (if indicated): {Desc; done/not:10129}  Assets:  {Assets (PAA):22698}  ADL's:  {  BHH QVZ'D:63875}  Cognition: {chl bhh cognition:304700322}  Sleep:  {BHH GOOD/FAIR/POOR:22877}   Screenings: AIMS    Flowsheet Row [facility]  AIMS Total Score 0      AUDIT    Flowsheet Row [facility]  Alcohol Use Disorder Identification Test Final Score (AUDIT) 1      GAD-7    Flowsheet Row Office Visit from 03/17/2023 in [facility] Visit from 12/24/2022 in [facility] [place] [facility] Office Visit from 08/19/2022 in [facility] Office Visit from 03/18/2022 in [facility] Counselor from 02/15/2022 in [facility]  Total GAD-7 Score 14 8 10 14 15       PHQ2-9    Flowsheet Row Office Visit from 03/17/2023 in [facility] Office Visit from 02/01/2023 in [facility] Visit from 12/24/2022 in [facility] [place] Office Visit from 11/18/2022 in [facility] Visit from 08/19/2022 in [facility]  PHQ-2 Total Score 1 0 4 0 2  PHQ-9 Total Score 8 -- 11 -- 4      Flowsheet Row ED to Hosp-Admission (Current) from 05/28/2023 in [facility] Admission (Discharged) from 05/12/2023 in [facility] ED to Hosp-Admission  (Discharged) from 03/29/2023 in [facility]  C-SSRS RISK CATEGORY No Risk No Risk No Risk        Assessment and Plan: ***  Collaboration of Care: Collaboration of Care: [facility] OP Collaboration of Care:[id number]}  Patient/Guardian was advised Release of Information must be obtained prior to any record release in order to collaborate their care with an outside provider. Patient/Guardian was advised if they have not already done so to contact the registration department to sign all necessary forms in order for [place] to release information regarding their care.   Consent: Patient/Guardian gives verbal consent for treatment and assignment of benefits for services provided during this visit. Patient/Guardian expressed understanding and agreed to proceed.    Neysa Hotter, MD 05/28/2023, 6:26 PM

## 2023-05-29 ENCOUNTER — Inpatient Hospital Stay

## 2023-05-29 LAB — URINALYSIS, ROUTINE W REFLEX MICROSCOPIC
Bilirubin Urine: NEGATIVE
Glucose, UA: NEGATIVE mg/dL
Hgb urine dipstick: NEGATIVE
Ketones, ur: NEGATIVE mg/dL
Leukocytes,Ua: NEGATIVE
Nitrite: NEGATIVE
Protein, ur: NEGATIVE mg/dL
Specific Gravity, Urine: 1.044 — ABNORMAL HIGH (ref 1.005–1.030)
pH: 7 (ref 5.0–8.0)

## 2023-05-29 LAB — MAGNESIUM: Magnesium: 1.8 mg/dL (ref 1.7–2.4)

## 2023-05-29 LAB — COMPREHENSIVE METABOLIC PANEL
ALT: 16 U/L (ref 0–44)
AST: 16 U/L (ref 15–41)
Albumin: 1.6 g/dL — ABNORMAL LOW (ref 3.5–5.0)
Alkaline Phosphatase: 88 U/L (ref 38–126)
Anion gap: 6 (ref 5–15)
BUN: 12 mg/dL (ref 6–20)
CO2: 29 mmol/L (ref 22–32)
Calcium: 7.4 mg/dL — ABNORMAL LOW (ref 8.9–10.3)
Chloride: 100 mmol/L (ref 98–111)
Creatinine, Ser: 0.64 mg/dL (ref 0.44–1.00)
GFR, Estimated: >60 mL/min (ref >60)
Glucose, Bld: 107 mg/dL — ABNORMAL HIGH (ref 70–99)
Potassium: 3.6 mmol/L (ref 3.5–5.1)
Sodium: 135 mmol/L (ref 135–145)
Total Bilirubin: 0.4 mg/dL (ref 0.0–1.2)
Total Protein: 4.4 g/dL — ABNORMAL LOW (ref 6.5–8.1)

## 2023-05-29 LAB — CBC
HCT: 29.3 % — ABNORMAL LOW (ref 36.0–46.0)
Hemoglobin: 10.3 g/dL — ABNORMAL LOW (ref 12.0–15.0)
MCH: 31.3 pg (ref 26.0–34.0)
MCHC: 35.2 g/dL (ref 30.0–36.0)
MCV: 89.1 fL (ref 80.0–100.0)
Platelets: 640 10*3/uL — ABNORMAL HIGH (ref 150–400)
RBC: 3.29 MIL/uL — ABNORMAL LOW (ref 3.87–5.11)
RDW: 18.6 % — ABNORMAL HIGH (ref 11.5–15.5)
WBC: 9.7 10*3/uL (ref 4.0–10.5)
nRBC: 0 % (ref 0.0–0.2)

## 2023-05-29 LAB — PHOSPHORUS: Phosphorus: 3.8 mg/dL (ref 2.5–4.6)

## 2023-05-29 MED ORDER — POTASSIUM CHLORIDE 10 MEQ/100ML IV SOLN
10.0000 meq | INTRAVENOUS | Status: AC
  Administered 2023-05-29 (×3): 10 meq via INTRAVENOUS
  Filled 2023-05-29 (×2): qty 100

## 2023-05-29 MED ORDER — POLYETHYLENE GLYCOL 3350 17 G PO PACK: 17.0000 g | PACK | Freq: Every day | ORAL | Status: DC | PRN

## 2023-05-29 MED ORDER — SODIUM CHLORIDE 0.9 % IV SOLN
12.5000 mg | Freq: Four times a day (QID) | INTRAVENOUS | Status: DC | PRN
  Administered 2023-06-02 – 2023-06-03 (×3): 12.5 mg via INTRAVENOUS
  Filled 2023-05-29 (×2): qty 12.5
  Filled 2023-05-29: qty 0.5

## 2023-05-29 MED ORDER — MIRTAZAPINE 15 MG PO TABS
15.0000 mg | ORAL_TABLET | Freq: Every day | ORAL | Status: DC
  Administered 2023-05-29: 15 mg
  Filled 2023-05-29: qty 1

## 2023-05-29 MED ORDER — POTASSIUM CHLORIDE 10 MEQ/100ML IV SOLN
10.0000 meq | Freq: Once | INTRAVENOUS | Status: AC
  Administered 2023-05-29: 10 meq via INTRAVENOUS
  Filled 2023-05-29: qty 100

## 2023-05-29 MED ORDER — HYDROXYZINE HCL 25 MG PO TABS: 25.0000 mg | ORAL_TABLET | Freq: Every day | ORAL | Status: DC | PRN

## 2023-05-29 NOTE — Plan of Care (Signed)

## 2023-05-29 NOTE — Progress Notes (Signed)
 05/29/2023  Subjective: No acute events overnight.  Patient reports that she is feeling better this morning.  Her pain is controlled with medications.  She had a bowel movement overnight.  White blood cell count is down to normal at 9.7.  Electrolytes without any significant derangement.  Vital signs: Temp:  [98.1 F (36.7 C)] 98.1 F (36.7 C) (03/22 1938) Pulse Rate:  [94] 94 (03/23 0738) Resp:  [16-20] 16 (03/23 0738) BP: (86-117)/(70-79) 86/70 (03/23 0738) SpO2:  [96 %-99 %] 99 % (03/23 0738)   Intake/Output: 03/22 0701 - 03/23 0700 In: 140.9 [I.V.:140.9] Out: -  Last BM Date : 05/27/23  Physical Exam: Constitutional: No acute distress Abdomen: Soft, nondistended, appropriately tender to palpation.  Midline incision is healing well and is clean, dry, intact.  Feeding jejunostomy tube in place appropriately capped.  Labs:  Recent Labs    05/28/23 1005 05/29/23 0457  WBC 13.5* 9.7  HGB 11.8* 10.3*  HCT 34.3* 29.3*  PLT 864* 640*   Recent Labs    05/28/23 1005 05/29/23 0457  NA 131* 135  K 3.7 3.6  CL 96* 100  CO2 29 29  GLUCOSE 132* 107*  BUN 15 12  CREATININE 0.71 0.64  CALCIUM 8.0* 7.4*   No results for input(s): "LABPROT", "INR" in the last 72 hours.  Imaging: DG Abd 2 Views Result Date: 05/29/2023 CLINICAL DATA:  Follow-up small-bowel obstruction EXAM: ABDOMEN - 2 VIEW COMPARISON:  Abdominal radiograph dated 05/20/2023, CT abdomen and pelvis dated 05/28/2023 FINDINGS: Gastric/enteric tube tip projects over the stomach. Right lower quadrant jejunostomy tube is again seen. Enteric contrast material within the right lower quadrant. Excreted contrast within the urinary bladder. Surgical clips in the right upper quadrant and surgical sutures in the left hemiabdomen. A few gas-filled dilated loops of bowel are present in the bilateral lower abdomen. IMPRESSION: 1. A few gas-filled dilated loops of bowel are present in the bilateral lower abdomen, which may represent  ileus or small bowel obstruction. 2. Enteric contrast material within the right lower quadrant. Electronically Signed   By: Agustin Cree M.D.   On: 05/29/2023 08:45    Assessment/Plan: This is a 58 y.o. female status post revision of gastrojejunostomy and placement of feeding jejunostomy tube.  - Discussed with patient again the findings on her CT scan from yesterday showing pretty distended stomach and proximal loops of small bowel going all the way past the jejunojejunostomy into the common limb.  This could still be related to the feeding tube causing a partial obstruction.  I think at this point, given that it has been difficult to keep her stomach decompressed, discussed with her that we would consult interventional radiology tomorrow to evaluate her for a percutaneous gastric tube placement to help [place] with venting or decompression as needed so that we can get her home without needing an NG tube.  This way, she could still eat or drink for comfort while still having feeds through her jejunostomy tube.  Potentially eventually, the G-tube could also be used as a feeding mechanism if her J-tube is discontinued. - For now, continue NG tube to suction.  Given that she is having bowel function, we will be able to start J-tube feeds tonight. - Continue pain medications, DVT prophylaxis. - Medications can be given through J-tube as possible.   I spent 35 minutes dedicated to the care of this patient on the date of this encounter to include pre-visit review of records, face-to-face time with the patient discussing diagnosis  and management, and any post-visit coordination of care.  Howie Ill, MD [facility]

## 2023-05-30 ENCOUNTER — Inpatient Hospital Stay: Admitting: Radiology

## 2023-05-30 HISTORY — PX: IR GASTROSTOMY TUBE MOD SED: IMG625

## 2023-05-30 LAB — COMPREHENSIVE METABOLIC PANEL
ALT: 17 U/L (ref 0–44)
AST: 19 U/L (ref 15–41)
Albumin: 1.7 g/dL — ABNORMAL LOW (ref 3.5–5.0)
Alkaline Phosphatase: 87 U/L (ref 38–126)
Anion gap: 8 (ref 5–15)
BUN: 11 mg/dL (ref 6–20)
CO2: 29 mmol/L (ref 22–32)
Calcium: 7.7 mg/dL — ABNORMAL LOW (ref 8.9–10.3)
Chloride: 103 mmol/L (ref 98–111)
Creatinine, Ser: 0.59 mg/dL (ref 0.44–1.00)
GFR, Estimated: >60 mL/min (ref >60)
Glucose, Bld: 106 mg/dL — ABNORMAL HIGH (ref 70–99)
Potassium: 4.1 mmol/L (ref 3.5–5.1)
Sodium: 140 mmol/L (ref 135–145)
Total Bilirubin: 0.3 mg/dL (ref 0.0–1.2)
Total Protein: 4.5 g/dL — ABNORMAL LOW (ref 6.5–8.1)

## 2023-05-30 LAB — MAGNESIUM: Magnesium: 1.8 mg/dL (ref 1.7–2.4)

## 2023-05-30 LAB — PHOSPHORUS: Phosphorus: 3.8 mg/dL (ref 2.5–4.6)

## 2023-05-30 MED ORDER — GLUCAGON HCL RDNA (DIAGNOSTIC) 1 MG IJ SOLR
INTRAMUSCULAR | Status: AC
  Filled 2023-05-30: qty 1

## 2023-05-30 MED ORDER — VANCOMYCIN HCL IN DEXTROSE 1-5 GM/200ML-% IV SOLN
1000.0000 mg | INTRAVENOUS | Status: AC
  Administered 2023-05-30: 1000 mg via INTRAVENOUS
  Filled 2023-05-30: qty 200

## 2023-05-30 MED ORDER — OXYCODONE HCL 5 MG/5ML PO SOLN
5.0000 mg | ORAL | Status: DC | PRN
  Administered 2023-05-30 – 2023-06-03 (×6): 5 mg via JEJUNOSTOMY
  Filled 2023-05-30 (×6): qty 5

## 2023-05-30 MED ORDER — POLYETHYLENE GLYCOL 3350 17 G PO PACK: 17.0000 g | PACK | Freq: Every day | ORAL | Status: DC | PRN

## 2023-05-30 MED ORDER — TRIPLE ANTIBIOTIC 3.5-400-5000 EX OINT
1.0000 | TOPICAL_OINTMENT | Freq: Every day | CUTANEOUS | Status: DC
  Administered 2023-05-31 – 2023-06-03 (×4): 1 via TOPICAL
  Filled 2023-05-30 (×5): qty 1

## 2023-05-30 MED ORDER — OSMOLITE 1.2 CAL PO LIQD
1190.0000 mL | ORAL | Status: DC
  Administered 2023-05-30 – 2023-06-02 (×4): 1190 mL via JEJUNOSTOMY

## 2023-05-30 MED ORDER — MIRTAZAPINE 15 MG PO TABS
15.0000 mg | ORAL_TABLET | Freq: Every day | ORAL | Status: DC
  Administered 2023-05-30 – 2023-06-02 (×4): 15 mg via JEJUNOSTOMY
  Filled 2023-05-30 (×5): qty 1

## 2023-05-30 MED ORDER — LIDOCAINE HCL 1 % IJ SOLN
10.0000 mL | Freq: Once | INTRAMUSCULAR | Status: AC
  Administered 2023-05-30: 10 mL via INTRADERMAL
  Filled 2023-05-30: qty 10

## 2023-05-30 MED ORDER — IOHEXOL 300 MG/ML  SOLN
8.0000 mL | Freq: Once | INTRAMUSCULAR | Status: AC | PRN
  Administered 2023-05-30: 8 mL

## 2023-05-30 MED ORDER — MIDAZOLAM HCL 2 MG/2ML IJ SOLN
INTRAMUSCULAR | Status: AC
  Filled 2023-05-30: qty 2

## 2023-05-30 MED ORDER — SODIUM CHLORIDE 0.9 % IV SOLN: INTRAVENOUS | Status: AC

## 2023-05-30 MED ORDER — LIDOCAINE HCL 1 % IJ SOLN
INTRAMUSCULAR | Status: AC
  Filled 2023-05-30: qty 20

## 2023-05-30 MED ORDER — MIDAZOLAM HCL 2 MG/2ML IJ SOLN
INTRAMUSCULAR | Status: AC | PRN
  Administered 2023-05-30: 1 mg via INTRAVENOUS

## 2023-05-30 MED ORDER — FENTANYL CITRATE (PF) 100 MCG/2ML IJ SOLN
INTRAMUSCULAR | Status: AC | PRN
  Administered 2023-05-30: 25 ug via INTRAVENOUS
  Administered 2023-05-30: 50 ug via INTRAVENOUS

## 2023-05-30 MED ORDER — VANCOMYCIN HCL IN DEXTROSE 1-5 GM/200ML-% IV SOLN
INTRAVENOUS | Status: AC
  Filled 2023-05-30: qty 200

## 2023-05-30 MED ORDER — FENTANYL CITRATE (PF) 100 MCG/2ML IJ SOLN
INTRAMUSCULAR | Status: AC
  Filled 2023-05-30: qty 2

## 2023-05-30 MED ORDER — ACETAMINOPHEN 160 MG/5ML PO SOLN: 650.0000 mg | Freq: Four times a day (QID) | ORAL | Status: DC | PRN

## 2023-05-30 NOTE — Consult Note (Signed)
 [facility] Liaison Note  05/30/2023  Tanya Nguyen 12-23-1965 [id number]  Location: RN Hospital Liaison met patient at bedside at [facility].  Insurance: [employer]   Tanya Nguyen is a 58 y.o. female who is a Primary Care Patient of Chestine Spore, Keane Scrape, NP [facility]. The patient was screened for 7 and 30 day readmission hospitalization with noted high risk score for unplanned readmission risk with 3 IP in 6 months.  The patient was assessed for potential Care Management service needs for post hospital transition for care coordination. Review of patient's electronic medical record reveals patient was admitted with gastric outlet obstruction.    Liaison visited pt at bedside and explained VBCI services for post hospital prevention readmission follow up calls. Offered services with a nurse care coordinator due to readmission risk (receptive). Liaison will make a referral for VBCI post hospital prevention readmission follow up calls closer to his discharge date.   Plan: [facility] Liaison will continue to follow progress and disposition to asess for post hospital community care coordination/management needs.  Referral request for community care coordination: pending disposition.   VBCI Care Management/Population Health does not replace or interfere with any arrangements made by the Inpatient Transition of Care team.   For questions contact:   Elliot Cousin, RN, BSN Hospital Liaison [place]   [facility], Population Health Office Hours MTWF  8:00 am-6:00 pm Direct Dial: [phone] mobile [email or web address]

## 2023-05-30 NOTE — Plan of Care (Signed)

## 2023-05-30 NOTE — Consult Note (Signed)
 Chief Complaint: Persistent nausea, vomiting, abdominal discomfort in a patient with an existing J tube; request for percutaneous venting gastrostomy tube  Referring Provider(s): Dr. Lerry Paterson  Supervising Physician: Gilmer Mor  Patient Status: [facility] - In-pt  History of Present Illness: Tanya Nguyen is a 58 y.o. female with a PMH of GERD, bipolar disorder, anemia, and bleeding duodenal ulcer. In 2019, she underwent an exploratory laparotomy for gastric outlet obstruction and as gastrojejunostomy was created at that time. In 2020, she underwent an open approach incisional hernia repair. She was able to tolerate PO liquids only at that time.   In March 2025, patient began to experience trouble swallowing and emesis. She underwent exploratory Laparotomy, lysis of adhesions, resection of previous gastrojejunostomy and jejunojejunostomy, creation of new Roux-en-Y gastrojejunostomy, new feeding jejunostomy, and was discharged 3/21. At that time, she demonstrated ability to pass multiple NG clamping trials and was tolerating J tube feedings and PO liquids.  On 3/22 she returned with c/o N/V, difficulty using J tube at home, and not tolerating PO liquids.   In the ED, she had a CT scan of abdomen/pelvis which again showed distention of the stomach and gastrojejunostomy and proximal bowel loops past the jejunojejunostomy. Dr. Aleen Campi reviewed case and suspected partial obstruction of the small bowel by J tube. A NG tube was placed, and pt reported improvement of sx to the team over the weekend with this intervention. IR was consulted with request for percutaneous G tube for continued venting. Patient would continue to receive feedings from J tube.  At time of exam, she is sitting up in bed, calm, interested in care. NG tube is present and attached to wall suction. Confirmed with RN that tube feedings through J tube were paused at 0800.     Patient is Full Code  Past Medical History:  Diagnosis  Date   Acquired pyloric stricture    Bipolar disorder (HCC)    Bleeding duodenal ulcer    CAP (community acquired pneumonia) 02/12/2020   Chronic esophagogastric ulcer    Chronic low back pain    Chronic pain syndrome    Constipation 04/02/2014   Depression    Duodenal obstruction    Gastric outlet obstruction 01/19/2018   GERD (gastroesophageal reflux disease)    Hair loss 09/22/2018   Head injury 12/24/2022   History of stomach ulcers    Hypothyroidism    IDA (iron deficiency anemia)    Incisional hernia, without obstruction or gangrene    Intractable vomiting    Kidney stones    Melena 05/08/2021   Migraine    Neuropathy    Nodule of middle lobe of right lung 09/17/2021   NSAID long-term use 09/16/2016   Pre-diabetes    Protein-calorie malnutrition, severe (HCC)    PTSD (post-traumatic stress disorder)    Right-sided chest pain 09/17/2021   Sacroiliac joint pain 08/14/2018   Side pain 03/30/2018   Small bowel anastomotic dilation    Thrombocytosis    Upper GI bleed 05/18/2021   Vitamin B12 deficiency    Vitamin D deficiency     Past Surgical History:  Procedure Laterality Date   ABDOMINAL HYSTERECTOMY  03/08/2002   partial   bypass gastrojejunostomy     CHOLECYSTECTOMY     COLONOSCOPY WITH PROPOFOL N/A 02/27/2018   Procedure: COLONOSCOPY WITH PROPOFOL;  Surgeon: Midge Minium, MD;  Location: [facility];  Service: Endoscopy;  Laterality: N/A;   ESOPHAGOGASTRODUODENOSCOPY (EGD) WITH PROPOFOL N/A 01/12/2018   Procedure: ESOPHAGOGASTRODUODENOSCOPY (EGD) WITH  BIOPSIES;  Surgeon: Midge Minium, MD;  Location: [facility];  Service: Endoscopy;  Laterality: N/A;   ESOPHAGOGASTRODUODENOSCOPY (EGD) WITH PROPOFOL N/A 01/25/2018   Procedure: ESOPHAGOGASTRODUODENOSCOPY (EGD) WITH PROPOFOL;  Surgeon: Wyline Mood, MD;  Location: [facility];  Service: Gastroenterology;  Laterality: N/A;   ESOPHAGOGASTRODUODENOSCOPY (EGD) WITH PROPOFOL N/A 02/27/2018    Procedure: ESOPHAGOGASTRODUODENOSCOPY (EGD) WITH PROPOFOL;  Surgeon: Midge Minium, MD;  Location: [facility];  Service: Endoscopy;  Laterality: N/A;   ESOPHAGOGASTRODUODENOSCOPY (EGD) WITH PROPOFOL N/A 05/09/2021   Procedure: ESOPHAGOGASTRODUODENOSCOPY (EGD) WITH PROPOFOL;  Surgeon: Jaynie Collins, DO;  Location: [facility];  Service: Gastroenterology;  Laterality: N/A;   ESOPHAGOGASTRODUODENOSCOPY (EGD) WITH PROPOFOL N/A 03/09/2022   Procedure: ESOPHAGOGASTRODUODENOSCOPY (EGD) WITH PROPOFOL;  Surgeon: Midge Minium, MD;  Location: [facility];  Service: Endoscopy;  Laterality: N/A;   ESOPHAGOGASTRODUODENOSCOPY (EGD) WITH PROPOFOL N/A 03/30/2023   Procedure: ESOPHAGOGASTRODUODENOSCOPY (EGD) WITH PROPOFOL;  Surgeon: Midge Minium, MD;  Location: [facility] END[facility];  Service: Endoscopy;  Laterality: N/A;   GASTROJEJUNOSTOMY N/A 05/12/2023   Procedure: GASTROJEJUNOSTOMY, revision, Lynden Oxford, PA-C to assist, possible feeding jejunostomy;  Surgeon: Henrene Dodge, MD;  Location: [facility];  Service: General;  Laterality: N/A;   INCISIONAL HERNIA REPAIR N/A 11/23/2018   Procedure: HERNIA REPAIR INCISIONAL;  Surgeon: Henrene Dodge, MD;  Location: [facility];  Service: General;  Laterality: N/A;   INSERTION OF MESH N/A 11/23/2018   Procedure: INSERTION OF MESH;  Surgeon: Henrene Dodge, MD;  Location: [facility];  Service: General;  Laterality: N/A;   LAPAROTOMY N/A 01/20/2018   Procedure: EXPLORATORY LAPAROTOMY;  Surgeon: Henrene Dodge, MD;  Location: [facility];  Service: General;  Laterality: N/A;   LYSIS OF ADHESION N/A 05/12/2023   Procedure: LYSIS OF ADHESION, open, Lynden Oxford, PA-C to assist;  Surgeon: Henrene Dodge, MD;  Location: [facility];  Service: General;  Laterality: N/A;   TOOTH EXTRACTION      Allergies: Penicillins, Nsaids, Gabapentin, Pregabalin, and Sulfa antibiotics  Medications: Prior to Admission medications   Medication Sig Start Date End Date Taking? Authorizing  Provider  acetaminophen (TYLENOL) 500 MG tablet Take 500-1,000 mg by mouth every 6 (six) hours as needed for moderate pain (pain score 4-6) or headache.   Yes [provider]  ARIPiprazole (ABILIFY) 2 MG tablet Take 1 tablet (2 mg total) by mouth at bedtime. 05/11/23 08/09/23 Yes Jomarie Longs, MD  bisacodyl (DULCOLAX) 5 MG EC tablet Take 5 mg by mouth daily as needed for moderate constipation.   Yes [provider]  diphenhydrAMINE (BENADRYL) 25 MG tablet Take 25 mg by mouth every 6 (six) hours as needed for allergies.   Yes [provider]  hydrOXYzine (ATARAX) 25 MG tablet Take 1 tablet (25 mg total) by mouth daily as needed for anxiety. 05/06/23 08/04/23 Yes Neysa Hotter, MD  levothyroxine (SYNTHROID) 50 MCG tablet Take 1 tablet by mouth every morning on an empty stomach with water only.  No food or other medications for 30 minutes. 05/02/23  Yes Doreene Nest, NP  mirtazapine (REMERON) 15 MG tablet Take 1 tablet (15 mg total) by mouth at bedtime. 05/11/23  Yes Jomarie Longs, MD  Multiple Vitamin (MULTIVITAMIN WITH MINERALS) TABS tablet Take 1 tablet by mouth daily. 04/01/23  Yes Esaw Grandchild A, DO  ondansetron (ZOFRAN-ODT) 4 MG disintegrating tablet Take 1 tablet (4 mg total) by mouth every 6 (six) hours as needed for nausea. 05/27/23  Yes Lynden Oxford R, PA-C  oxybutynin (DITROPAN-XL) 5 MG 24 hr tablet TAKE 1 TABLET (  5 MG TOTAL) BY MOUTH AT BEDTIME. FOR BLADDER INCONTINENCE. 01/21/23  Yes Doreene Nest, NP  oxyCODONE (ROXICODONE) 5 MG/5ML solution Take 5 mLs (5 mg total) by mouth every 4 (four) hours as needed for up to 14 days for moderate pain (pain score 4-6) or severe pain (pain score 7-10). 05/27/23 06/10/23 Yes Donovan Kail, PA-C  pantoprazole (PROTONIX) 40 MG tablet Take 1 tablet (40 mg total) by mouth daily. 05/04/23 04/28/24 Yes Celso Amy, PA-C  SUMAtriptan (IMITREX) 50 MG tablet Take 1 tablet at migraine onset. May repeat in 2 hours if headache  persists or recurs. 03/08/23  Yes Doreene Nest, NP  traMADol (ULTRAM) 50 MG tablet Take 1 tablet (50 mg total) by mouth every 12 (twelve) hours as needed. 02/01/23  Yes Edward Jolly, MD     Family History  Problem Relation Age of Onset   Cancer Mother    Breast cancer Mother 29   Lung cancer Father    Drug abuse Sister    Alcohol abuse Sister    Bipolar disorder Sister    Alcohol abuse Maternal Grandmother    Stroke Maternal Grandmother    Stroke Paternal Grandmother     Social History   Socioeconomic History   Marital status: Married    Spouse name: Not on file   Number of children: 0   Years of education: 12   Highest education level: High school graduate  Occupational History   Occupation: Homemaker  Tobacco Use   Smoking status: Never    Passive exposure: Never   Smokeless tobacco: Never  Vaping Use   Vaping status: Never Used  Substance and Sexual Activity   Alcohol use: Not Currently    Alcohol/week: 0.0 standard drinks of alcohol   Drug use: No   Sexual activity: Not Currently    Birth control/protection: None  Other Topics Concern   Not on file  Social History Narrative   Lives at home with her husband.   Right-handed.   Rare caffeine.   Social Drivers of Corporate investment banker Strain: Not on file  Food Insecurity: No Food Insecurity (05/28/2023)   Hunger Vital Sign    Worried About Running Out of Food in the Last Year: Never true    Ran Out of Food in the Last Year: Never true  Transportation Needs: No Transportation Needs (05/28/2023)   PRAPARE - Administrator, Civil Service (Medical): No    Lack of Transportation (Non-Medical): No  Physical Activity: Not on file  Stress: Not on file  Social Connections: Socially Integrated (03/30/2023)   Social Connection and Isolation Panel [NHANES]    Frequency of Communication with Friends and Family: Three times a week    Frequency of Social Gatherings with Friends and Family: Never     Attends Religious Services: More than 4 times per year    Active Member of [employer] or Organizations: Yes    Attends Engineer, structural: More than 4 times per year    Marital Status: Married     Review of Systems  Constitutional:  Positive for appetite change. Negative for chills and fever.  Respiratory:  Negative for shortness of breath.   Cardiovascular:  Negative for chest pain.  Gastrointestinal:  Positive for abdominal distention, abdominal pain and nausea. Negative for vomiting.       Endorses improved nausea and abd pain since placement of NG  Hematological:  Does not bruise/bleed easily.     Vital Signs:  BP 115/83 (BP Location: Right Arm)   Pulse 98   Temp 98 F (36.7 C) (Oral)   Resp 15   Ht 4\' 11"  (1.499 m)   Wt 70 lb 1 oz (31.8 kg)   SpO2 97%   BMI 14.15 kg/m     Physical Exam HENT:     Mouth/Throat:     Mouth: Mucous membranes are moist.     Pharynx: Oropharynx is clear.  Cardiovascular:     Rate and Rhythm: Normal rate and regular rhythm.     Heart sounds: Normal heart sounds.  Pulmonary:     Effort: Pulmonary effort is normal.     Breath sounds: Normal breath sounds.  Abdominal:     General: Abdomen is flat. There is no distension.     Palpations: Abdomen is soft.     Tenderness: There is abdominal tenderness.     Comments: Midline incision with steri-strips present and intact, no drainage from wound. J tube intact and present L abd: no redness, drainage, wounds noted at insertion site, dressing is C/D/I.   Musculoskeletal:     Right lower leg: No edema.     Left lower leg: No edema.  Skin:    General: Skin is warm and dry.  Neurological:     Mental Status: She is alert and oriented to person, place, and time.  Psychiatric:        Behavior: Behavior normal.        Thought Content: Thought content normal.      Imaging: DG Abd 2 Views Result Date: 05/29/2023 CLINICAL DATA:  Follow-up small-bowel obstruction EXAM: ABDOMEN - 2 VIEW  COMPARISON:  Abdominal radiograph dated 05/20/2023, CT abdomen and pelvis dated 05/28/2023 FINDINGS: Gastric/enteric tube tip projects over the stomach. Right lower quadrant jejunostomy tube is again seen. Enteric contrast material within the right lower quadrant. Excreted contrast within the urinary bladder. Surgical clips in the right upper quadrant and surgical sutures in the left hemiabdomen. A few gas-filled dilated loops of bowel are present in the bilateral lower abdomen. IMPRESSION: 1. A few gas-filled dilated loops of bowel are present in the bilateral lower abdomen, which may represent ileus or small bowel obstruction. 2. Enteric contrast material within the right lower quadrant. Electronically Signed   By: Agustin Cree M.D.   On: 05/29/2023 08:45   DG Chest Portable 1 View Result Date: 05/28/2023 CLINICAL DATA:  Nasogastric tube placement EXAM: PORTABLE CHEST 1 VIEW COMPARISON:  09/17/2021 FINDINGS: The nasogastric tube side port is in the stomach body with tip in the stomach fundus satisfactorily positioned. The lungs appear clear. Cardiac and mediastinal contours normal. No blunting of the costophrenic angles. IMPRESSION: 1. Satisfactory positioning of the nasogastric tube. Electronically Signed   By: Gaylyn Rong M.D.   On: 05/28/2023 14:52   CT ABDOMEN PELVIS W CONTRAST Result Date: 05/28/2023 CLINICAL DATA:  Acute abdominal pain and vomiting beginning this morning. Postop from lysis of adhesions, new Roux EN Y gastrojejunostomy and feeding jejunostomy. EXAM: CT ABDOMEN AND PELVIS WITH CONTRAST TECHNIQUE: Multidetector CT imaging of the abdomen and pelvis was performed using the standard protocol following bolus administration of intravenous contrast. RADIATION DOSE REDUCTION: This exam was performed according to the departmental dose-optimization program which includes automated exposure control, adjustment of the mA and/or kV according to patient size and/or use of iterative reconstruction  technique. CONTRAST:  OMNIPAQUE IOHEXOL 300 MG/ML  SOLN COMPARISON:  05/19/2023 FINDINGS: Lower Chest: No acute findings. Hepatobiliary: No suspicious hepatic masses  identified. Small right and left hepatic lobe cysts are stable. Prior cholecystectomy. No evidence of biliary obstruction. Pancreas:  No mass or inflammatory changes. Spleen: Within normal limits in size and appearance. Adrenals/Urinary Tract: No suspicious masses identified. No evidence of ureteral calculi or hydronephrosis. Unremarkable unopacified urinary bladder. Stomach/Bowel: Prior Roux-en-Y gastrojejunostomy again seen. Markedly dilated and fluid-filled small bowel a both the efferent and afferent limbs is seen to the level of the jejuno jejunostomy anastomosis in the left lower quadrant. Degree of dilatation is increased since previous study and consistent with small bowel obstruction. Feeding jejunostomy remains in place with tip in the right mid abdomen. A Vascular/Lymphatic: No pathologically enlarged lymph nodes. No acute vascular findings. Reproductive: Prior hysterectomy. No adnexal masses are identified. Small amount of free fluid noted in pelvic cul-de-sac. Other:  None. Musculoskeletal:  No suspicious bone lesions identified. IMPRESSION: Worsening small bowel obstruction, with transition point near the jejunojejunostomy anastomosis in the left lower quadrant. Small amount of free fluid in pelvic cul-de-sac. Electronically Signed   By: Danae Orleans M.D.   On: 05/28/2023 14:17   CT ANGIO HEAD NECK W WO CM Result Date: 05/28/2023 CLINICAL DATA:  Pupil asymmetry. Vomiting today. Patient was discharged from [facility] yesterday following surgery for bowel obstruction 05/12/2023. EXAM: CT ANGIOGRAPHY HEAD AND NECK WITH AND WITHOUT CONTRAST TECHNIQUE: Multidetector CT imaging of the head and neck was performed using the standard protocol during bolus administration of intravenous contrast. Multiplanar CT image  reconstructions and MIPs were obtained to evaluate the vascular anatomy. Carotid stenosis measurements (when applicable) are obtained utilizing NASCET criteria, using the distal internal carotid diameter as the denominator. RADIATION DOSE REDUCTION: This exam was performed according to the departmental dose-optimization program which includes automated exposure control, adjustment of the mA and/or kV according to patient size and/or use of iterative reconstruction technique. CONTRAST:  OMNIPAQUE IOHEXOL 300 MG/ML  SOLN COMPARISON:  CT head without contrast 11/27/2010 FINDINGS: CT HEAD FINDINGS Brain: Mild atrophy and white matter changes are new since the prior exam. No acute infarct, hemorrhage, or mass lesion is present. The ventricles are of normal size. No significant extraaxial fluid collection is present. Midline structures are within normal limits. The brainstem and cerebellum are within normal limits. Vascular: No hyperdense vessel or unexpected calcification. Skull: Mild soft tissue swelling is present the left occipital scalp. No underlying fracture or foreign body is present. Sinuses/Orbits: The paranasal sinuses and mastoid air cells are clear. The globes and orbits are within normal limits. Review of the MIP images confirms the above findings CTA NECK FINDINGS Aortic arch: Common origin of the left common carotid artery and innominate artery is noted. Calcifications are present at the origins the great vessels. No focal stenosis is present. No aneurysm or dissection is present. Right carotid system: The right common carotid artery is within normal limits. Bifurcation is unremarkable. Cervical right ICA is normal. Left carotid system: Left common carotid artery is within normal limits. Bifurcation is unremarkable. The cervical left ICA is normal. Vertebral arteries: Right vertebral artery is the dominant vessel. Both vertebral arteries originate from the subclavian arteries without significant  stenosis. No significant stenosis is present in either vertebral artery in the neck. Skeleton: Mild degenerative changes are present in the cervical spine. Uncovertebral spurring contributes to foraminal narrowing at C5-6 and C6-7, right greater than left. Other neck: The soft tissues of the neck are otherwise unremarkable. Salivary glands are within normal limits. Thyroid is normal. No significant adenopathy is present. No  focal mucosal or submucosal lesions are present. Upper chest: The lung apices are clear. Review of the MIP images confirms the above findings CTA HEAD FINDINGS Anterior circulation: The internal carotid arteries are within normal limits from high cervical segments through the ICA termini. The A1 and M1 segments are normal. The MCA bifurcations are within normal limits. The anterior communicating artery is patent. The ACA and MCA branch vessels are within normal limits. No aneurysm is present. Posterior circulation: The PICA origins are visualized and normal. The left posterior scratched at the left vertebral artery is centrally terminates at the PICA. The basilar artery is diminutive, terminating at the superior cerebellar arteries, a normal variant. The posterior cerebral arteries are of fetal type bilaterally. Small P1 segments are noted. The PCA branch vessels are within normal limits bilaterally. Venous sinuses: The dural sinuses are patent. The straight sinus and deep cerebral veins are intact. Cortical veins are within normal limits. No significant vascular malformation is evident. Anatomic variants: Fetal type posterior cerebral arteries bilaterally. Review of the MIP images confirms the above findings IMPRESSION: 1. Normal CTA of the head and neck. No large vessel occlusion or hemodynamically significant stenosis. 2. Mild soft tissue swelling of the left occipital scalp without underlying fracture or foreign body. 3. Mild atrophy and white matter disease is new since the prior exam. This  likely reflects the sequela of chronic microvascular ischemia. 4. Degenerative changes of the cervical spine as described. Electronically Signed   By: Marin Roberts M.D.   On: 05/28/2023 13:49   DG Abd 2 Views Result Date: 05/20/2023 CLINICAL DATA:  Abdominal distension. EXAM: ABDOMEN - 2 VIEW COMPARISON:  May 17, 2023. FINDINGS: Nasogastric tube tip is seen in proximal stomach. Percutaneous drainage catheter is noted with tip in pelvis. Midline surgical staples are noted. No abnormal bowel dilatation is noted. Residual contrast is noted in nondilated colon and rectum. IMPRESSION: No abnormal bowel dilatation is noted at this time. Electronically Signed   By: Lupita Raider M.D.   On: 05/20/2023 09:21   CT ABDOMEN PELVIS WO CONTRAST Result Date: 05/19/2023 CLINICAL DATA:  Assess for possible leak EXAM: CT ABDOMEN AND PELVIS WITHOUT CONTRAST TECHNIQUE: Multidetector CT imaging of the abdomen and pelvis was performed following the standard protocol without IV contrast. RADIATION DOSE REDUCTION: This exam was performed according to the departmental dose-optimization program which includes automated exposure control, adjustment of the mA and/or kV according to patient size and/or use of iterative reconstruction technique. COMPARISON:  05/19/2023 UGI, CT 05/16/2023, 09/26/2023 FINDINGS: Lower chest: Lung bases demonstrate no acute airspace disease. Small left-sided pleural effusion, mildly diminished Hepatobiliary: Cholecystectomy.  No biliary dilatation Pancreas: Unremarkable. No pancreatic ductal dilatation or surrounding inflammatory changes. Spleen: Normal in size without focal abnormality. Adrenals/Urinary Tract: Adrenal glands are normal. Kidneys show no hydronephrosis. Multiple nonobstructing kidney stones. The bladder contains dilute contrast Stomach/Bowel: Marked distension of the stomach with contrast. Enteric tube tip in the body of the stomach. Contrast fills the non excluded stomach and  duodenum in addition to jejunal bowel loops. The patient is status post gastro jejunostomy and jejuno jejunostomy; there is also contrast filling of the jejunal limb. Persistent moderate distension of multiple proximal small bowel loops, now containing contrast. Feeding jejunostomy in the left abdomen with tip in the right pelvis. Small bowel surrounding the jejunostomy tube is decompressed however there is contrast surrounding the tube and within the small bowel distal to it. The small bowel in the pelvis is nondistended. Dilute contrast has  reached the terminal ileum. Vascular/Lymphatic: Aortic atherosclerosis. No enlarged abdominal or pelvic lymph nodes. Reproductive: Uterus and bilateral adnexa are unremarkable. Other: Decreased air-fluid level anterior to the liver with small residual fluid and gas. Additional small foci of residual intraperitoneal gas within the anterior upper abdomen though overall decreasing quantity of free gas. Trace volume free fluid adjacent to the tip of the liver. Small volume free fluid in the pelvis is of water density. Musculoskeletal: No acute osseous abnormality. IMPRESSION: 1. Status post gastro jejunostomy, jejunal jejunostomy and left abdominal feeding jejunostomy. Considerable contrast distension of the stomach with persistent multiple dilated loops of contrast filled proximal small bowel. There is less distended contrast containing small bowel in the pelvis and some dilute contrast has reached the right colon, again findings could be secondary to on going ileus versus partial bowel obstruction. Contrast has reached the left abdominal feeding jejunostomy; the bowel surrounding the jejunostomy is decompressed as is the small bowel distal to it. 2. Small volume water density fluid in the pelvis. No high density fluid to suggest intraperitoneal contrast as was questioned on UGI or earlier today. There is dilute contrast in the urinary bladder which may have contributed to the  radiographic findings earlier today. 3. Still some residual free gas adjacent to the gastrojejunal anastomosis but overall decreasing gas and fluid in the upper abdomen and therefore favoring slowly resolving postoperative gas. 4. Nonobstructing kidney stones 5. Decreased small left effusion Electronically Signed   By: Jasmine Pang M.D.   On: 05/19/2023 22:33   DG UGI W SMALL BOWEL Result Date: 05/19/2023 CLINICAL DATA:  Patient with recent exploratory laparotomy, lysis of adhesions, resection of previous gastrojejunostomy and jejunojejunostomy, creation of new Roux-en-Y gastrojejunostomy, new feeding jejunostomy placed 05/12/2023. Request received to repeat an upper GI series to exclude a post-operative leak EXAM: UPPER GI SERIES WITH SMALL BOWEL FOLLOW-THROUGH FLUOROSCOPY: Radiation Exposure Index (as provided by the fluoroscopic device): 14.9 mGy Kerma TECHNIQUE: Upper GI series using water-soluble contrast. Subsequently, serial images of the small bowel were obtained including spot views of the terminal ileum. This exam was performed by Alwyn Ren NP, and was supervised and interpreted by Dr. Milford Cage. COMPARISON:  DG upper GI 05/16/2023. CT AP, 05/16/2018 and 03/09/2023. FINDINGS: Esophagus: Enteric decompression tube is present, with tip coiled within the proximal stomach. Esophagus is otherwise normal in appearance. Esophageal motility:  Within normal limits. Gastroesophageal reflux:  None visualized. Ingested 13mm barium tablet:  Not given Stomach: No hiatus hernia. Postsurgical changes of gastrojejunostomy with opacification of the Roux limb. Gastric emptying: Non excluded stomach, with contrast exiting the pylorus into the duodenum. Question pyloric stenosis, with the pylorus measuring sub-1 cm Duodenum: Mildly delayed transit through the duodenal past the ligament of Treitz. Other: Contrast opacification of the common channel however delayed transit to the distal small bowel. Follow-up  radiographs: Hourly XRs were obtained after initial upper GI study, which terminated at 11:55 a.m. XRs demonstrating contrast opacification of the common channel and small-bowel loops with delayed transit. Final XRs obtained at 4:13 p.m. without contrast reaching the surgical jejunostomy tube, terminal ileum or colon. Question double wall appearance of pelvic small bowel loops, this may be superior in position though a contrast extravasation within the peritoneal cavity may appear similar IMPRESSION: Delayed transit of ingested contrast never reaching the surgical jejunostomy tube, terminal ileum nor colon within 4 hours. Questioned wall appearance of pelvic small bowel loops, suspicious for contrast extravasation within the peritoneal cavity. A confirmatory noncontrast CT AP is recommended.  These results were called by telephone at the time of interpretation on 05/19/2023 at 5:10 pm to provider Dr. Henrene Dodge, who verbally acknowledged these results. Electronically Signed   By: Roanna Banning M.D.   On: 05/19/2023 17:20   DG Abd 2 Views Result Date: 05/17/2023 CLINICAL DATA:  Abdominal distension. EXAM: ABDOMEN - 2 VIEW COMPARISON:  Abdominal radiograph dated 05/16/2023. FINDINGS: Enteric tube with tip and side-port in the left upper abdomen in the proximal stomach. Percutaneous catheter with tip over the lower abdomen. Dilated small bowel loops measure up to 3.5 cm. Right upper quadrant cholecystectomy clips. Free air in the upper abdomen, likely related to recent surgery. Midline vertical anterior abdominal wall cutaneous staples. No acute osseous pathology. IMPRESSION: 1. Enteric tube with tip and side-port in the proximal stomach. 2. Dilated small bowel loops may represent ileus or obstruction. 3. Free air in the upper abdomen, likely related to recent surgery. Electronically Signed   By: Elgie Collard M.D.   On: 05/17/2023 12:59   DG Abd 1 View Result Date: 05/16/2023 CLINICAL DATA:  NG tube placement  EXAM: ABDOMEN - 1 VIEW COMPARISON:  Head CT 05/16/2023 FINDINGS: The tip of the nasogastric tube is in the body of the stomach. Dilated small bowel loops are again noted. Cholecystectomy clips are present. IMPRESSION: The tip of the nasogastric tube is in the body of the stomach. Electronically Signed   By: Darliss Cheney M.D.   On: 05/16/2023 23:11   DG UGI W SINGLE CM (SOL OR THIN BA) Addendum Date: 05/16/2023 ADDENDUM REPORT: 05/16/2023 20:19 ADDENDUM: A CT of the abdomen/pelvis was subsequently performed on 05/16/2023 at 2:58 p.m. Demonstrated on this CT, the stomach is dilated and predominantly fluid-filled. Thus, the findings described on today's upper GI series likely reflect rapid dilution of the swallowed contrast as it enters the stomach. No free intraperitoneal contrast is identified on today's abdominopelvic CT. However, given the amount of fluid within the stomach at time of today's upper GI series (limiting evaluation for contrast leak), and given the presence of small-volume intraperitoneal free air on today's abdominopelvic CT, consider a repeat upper GI series following gastric decompression. Findings discussed with Dr. Aleen Campi in person at 5 p.m. on 05/16/2023. Electronically Signed   By: Jackey Loge D.O.   On: 05/16/2023 20:19   Result Date: 05/16/2023 CLINICAL DATA:  Exploratory laparotomy, lysis of adhesions, resection of previous gastrojejunostomy and jejunojejunostomy, creation of new Roux-en-Y gastrojejunostomy, new feeding jejunostomy placed 05/12/2023. Request received for an upper GI series to exclude a post-operative leak. EXAM: DG UGI W SINGLE CM TECHNIQUE: A scout radiograph of the abdomen was acquired. Subsequently, a problem-oriented water-soluble contrast upper GI series was performed to assess for post-operative leak or obstruction. Omnipaque 300 contrast was used. FLUOROSCOPY: Radiation Exposure Index (as provided by the fluoroscopic device): 7.10 mGy Kerma COMPARISON:  Upper  GI series 03/31/2023. CT abdomen/pelvis 03/29/2023. FINDINGS: A scout radiograph of the abdomen was acquired. Nonobstructive bowel gas pattern within the visualized portions of the abdomen. Incompletely imaged jejunostomy tube. Abdominal staples at midline. No acute osseous abnormality identified. An immediate, brisk and extensive contrast leak was observed just below the level of the distal esophagus, presumably from the gastric pouch/gastrojejunostomy site. The examination was terminated and the ordering provider was immediately notified. The examination was performed by Anders Grant NP, and was supervised and interpreted by Dr. Jackey Loge. Dr. Aleen Campi was made aware of these results, and he acknowledged these results, via Epic Chat at 3:30 pm  on 05/16/2023. IMPRESSION: Immediate, brisk and extensive contrast leak observed just below the level of the distal esophagus, presumably from the gastric pouch/gastrojejunostomy. Electronically Signed: By: Jackey Loge D.O. On: 05/16/2023 16:10   CT ABDOMEN PELVIS W CONTRAST Result Date: 05/16/2023 CLINICAL DATA:  Status post gastro jejunostomy concern for leak 4 days postop EXAM: CT ABDOMEN AND PELVIS WITH CONTRAST TECHNIQUE: Multidetector CT imaging of the abdomen and pelvis was performed using the standard protocol following bolus administration of intravenous contrast. RADIATION DOSE REDUCTION: This exam was performed according to the departmental dose-optimization program which includes automated exposure control, adjustment of the mA and/or kV according to patient size and/or use of iterative reconstruction technique. CONTRAST:  80mL OMNIPAQUE IOHEXOL 300 MG/ML  SOLN COMPARISON:  UGI 05/16/2023, 03/31/2023, CT 03/29/2023, 09/21/2021, UGI 03/17/2022 FINDINGS: Lower chest: Lung bases demonstrate no acute airspace disease. Small bilateral pleural effusions and dependent atelectasis. Hepatobiliary: Cholecystectomy. No significant change in benign-appearing  hypodensities within the liver. No biliary dilatation. Pancreas: Unremarkable. No pancreatic ductal dilatation or surrounding inflammatory changes. Spleen: Normal in size without focal abnormality. Adrenals/Urinary Tract: Adrenal glands are normal. Kidneys show no hydronephrosis. Multiple punctate nonobstructing kidney stones. The bladder is normal Stomach/Bowel: Considerable fluid and contrast distension of the stomach. Patient is status post revision of gastro jejunostomy and jejunal jejunostomy. Small volume gas adjacent to the gastro jejunostomy anastomosis. No frank signs for extraluminal contrast within the abdomen or pelvis. There are multiple fluid-filled dilated loops of small bowel with gradual progression to fluid-filled less distended small bowel in the pelvis suggestive of an ileus, no well-defined transition point. Mild wall thickening and mucosal enhancement of the transverse colon and splenic flexure. Diffuse mucosal enhancement of the descending and rectosigmoid colon as well. Interim placement of left abdominal percutaneous feeding jejunostomy with tip in the right lower quadrant/upper pelvis, the tip is presumably intraluminal and up against the wall of a loop of small bowel. Vascular/Lymphatic: Nonaneurysmal aorta. No suspicious lymph nodes. Retroaortic left renal vein. Reproductive: Uterus and bilateral adnexa are unremarkable. Other: Generalized subcutaneous edema. Multiple foci of free intraperitoneal gas with air-fluid level anterior to the liver. Small volume free fluid in the right upper quadrant and the pelvis. Musculoskeletal: No acute or suspicious osseous abnormality IMPRESSION: 1. Patient is status post revision of gastro jejunostomy and jejuno jejunostomy. Considerable fluid distension of the stomach with intraluminal contrast within the gastric fundus and body of stomach. Suspect that findings on UGI performed earlier today represented brisk passage of contrast into the lumen of  dilated stomach with dilutional effect on the contrast. No obvious extraluminal contrast on this exam. There is however air-fluid level in the right upper quadrant anterior to the liver with small amount of intraperitoneal gas adjacent to the gastro jejunal anastomosis and elsewhere within the abdomen. Though findings could be secondary to recent postoperative status, leak at the gastrojejunostomy site is not definitively excluded on this exam. A repeat UGI examination could be performed following decompression of stomach, and with focused attention at the gastrojejunostomy anastomotic site. 2. Multiple fluid-filled dilated loops of small bowel without well-defined transition point suggestive of postoperative ileus, less likely partial obstruction. New left abdominal feeding jejunostomy tube with slightly unusual appearance of the feeding tube tip, presumably up against the wall of a loop of decompressed small bowel in the right lower quadrant/pelvis. 3. Small bilateral pleural effusions 4. Generalized subcutaneous edema consistent with anasarca Electronically Signed   By: Jasmine Pang M.D.   On: 05/16/2023 19:42  Labs:  CBC: Recent Labs    05/15/23 0550 05/24/23 0610 05/28/23 1005 05/29/23 0457  WBC 8.7 16.9* 13.5* 9.7  HGB 11.8* 10.7* 11.8* 10.3*  HCT 33.3* 30.5* 34.3* 29.3*  PLT 200 637* 864* 640*    COAGS: No results for input(s): "INR", "APTT" in the last 8760 hours.  BMP: Recent Labs    05/24/23 0610 05/28/23 1005 05/29/23 0457 05/30/23 0506  NA 135 131* 135 140  K 4.0 3.7 3.6 4.1  CL 97* 96* 100 103  CO2 32 29 29 29   GLUCOSE 97 132* 107* 106*  BUN 12 15 12 11   CALCIUM 7.7* 8.0* 7.4* 7.7*  CREATININE 0.66 0.71 0.64 0.59  GFRNONAA >60 >60 >60 >60    LIVER FUNCTION TESTS: Recent Labs    03/29/23 1051 05/16/23 0528 05/22/23 0529 05/28/23 1005 05/29/23 0457 05/30/23 0506  BILITOT 0.9  --   --  0.7 0.4 0.3  AST 47*  --   --  22 16 19   ALT 28  --   --  19 16 17    ALKPHOS 79  --   --  114 88 87  PROT 5.7*  --   --  5.4* 4.4* 4.5*  ALBUMIN 2.9*   < > 1.7* 2.0* 1.6* 1.7*   < > = values in this interval not displayed.    TUMOR MARKERS: No results for input(s): "AFPTM", "CEA", "CA199", "CHROMGRNA" in the last 8760 hours.  Assessment and Plan:  Request for percutaneous gastrostomy tube placement for gastric venting - NG in place with reported improvement of sx over the weekend.  - tolerating nocturnal J tube feedings while IP - J tube feedings paused since 8am per RN - WBC, Hgb, Plt, Creatinine within acceptable range.  - VSS, afebrile - 3/22 CT : Worsening small bowel obstruction, with transition point near the jejunojejunostomy anastomosis in the left lower quadrant. - No home blood thinners; she is receiving lovenox IP, last given yesterday at 2200.  - Case discussed with Dr. Loreta Ave who approves image guided percutaneous gastrostomy for venting. Planned 05/30/23. - Per Dr. Aleen Campi, patient will continue feeding through J tube.  - Consent signed and in IR control room   Risks and benefits image guided gastrostomy tube placement was discussed with the patient including, but not limited to the need for a barium enema during the procedure, bleeding, infection, peritonitis and/or damage to adjacent structures.  All of the patient's questions were answered, patient is agreeable to proceed.  Consent signed and in chart.   Thank you for allowing our service to participate in Adilyn Humes 's care.    Electronically Signed: Carlton Adam, NP   05/30/2023, 10:32 AM     I spent a total of 40 Minutes    in face to face in clinical consultation, greater than 50% of which was counseling/coordinating care for image guided percutaneous gastrostomy for gastric venting   (A copy of this note was sent to the referring provider and the time of visit.)

## 2023-05-30 NOTE — Progress Notes (Signed)
 Provider came to see the patient at 4:00PM but patient was off the unit for PEG tube. Will come by first thing in the morning to see the consult.

## 2023-05-30 NOTE — OR Nursing (Signed)
 Dr Aleen Campi messaged about pt request for sips:  So with NG in place, and G tube in place capped, let's keep the NG to suction, keep the G tube capped, and after the 4 hrs, it's ok for her to have small amount of ice chips while the NG is to suction.  Plan is tomorrow to reassess whether NG tube can come out.

## 2023-05-30 NOTE — Procedures (Signed)
 Interventional Radiology Procedure Note  Procedure: Placement of percutaneous 57F pull-through gastrostomy tube. Complications: None Recommendations: - OK to use in 4 hours - OK to remove the NG in 4 hours or when the primary would like removed - Routine wound care at the gtube site - ice prn pain at the insertion site  Signed,  Yvone Neu. Loreta Ave, DO, ABVM, RPVI

## 2023-05-30 NOTE — Progress Notes (Signed)
 [facility] SURGICAL PROGRESS NOTE  Hospital Day(s): 2.   Interval History:  Patient seen and examined No acute events overnight Did have increase in upper abdominal pain overnight Continued nausea No fever, chills Labs are reassuring NGT with 1300 ccs She is on nocturnal J-tube feeds Having bowel function   Vital signs in last 24 hours: [min-max] current  Temp:  [97.5 F (36.4 C)-97.9 F (36.6 C)] 97.5 F (36.4 C) (03/24 0103) Pulse Rate:  [94-100] 100 (03/24 0103) Resp:  [16-17] 17 (03/24 0103) BP: (86-123)/(70-81) 123/81 (03/24 0103) SpO2:  [98 %-100 %] 98 % (03/24 0103)     Height: 4\' 11"  (149.9 cm) Weight: 31.8 kg BMI (Calculated): 14.13   Intake/Output last 2 shifts:  03/23 0701 - 03/24 0700 In: 1195 [NG/GT:1135] Out: 1900 [Urine:600; Emesis/NG output:1300]   Physical Exam:  Constitutional: alert, cooperative and no distress  HEENT: NGT in place; flushed without issue  Respiratory: breathing non-labored at rest  Cardiovascular: regular rate and sinus rhythm  Gastrointestinal: soft, incisional soreness worse in upper abdomen, and non-distended. No rebound/guarding. Jejunostomy tube in left abdomen; nocturnal tube feeds running Integumentary: Laparotomy is healing well, steri-strips in place, no erythema    Labs:     Latest Ref Rng & Units 05/29/2023    4:57 AM 05/28/2023   10:05 AM 05/24/2023    6:10 AM  CBC  WBC 4.0 - 10.5 K/uL 9.7  13.5  16.9   Hemoglobin 12.0 - 15.0 g/dL 45.4  09.8  11.9   Hematocrit 36.0 - 46.0 % 29.3  34.3  30.5   Platelets 150 - 400 K/uL 640  864  637       Latest Ref Rng & Units 05/30/2023    5:06 AM 05/29/2023    4:57 AM 05/28/2023   10:05 AM  CMP  Glucose 70 - 99 mg/dL 147  829  562   BUN 6 - 20 mg/dL 11  12  15    Creatinine 0.44 - 1.00 mg/dL 1.30  8.65  7.84   Sodium 135 - 145 mmol/L 140  135  131   Potassium 3.5 - 5.1 mmol/L 4.1  3.6  3.7   Chloride 98 - 111 mmol/L 103  100  96   CO2 22 - 32 mmol/L 29  29  29     Calcium 8.9 - 10.3 mg/dL 7.7  7.4  8.0   Total Protein 6.5 - 8.1 g/dL 4.5  4.4  5.4   Total Bilirubin 0.0 - 1.2 mg/dL 0.3  0.4  0.7   Alkaline Phos 38 - 126 U/L 87  88  114   AST 15 - 41 U/L 19  16  22    ALT 0 - 44 U/L 17  16  19       Imaging studies: No new pertinent imaging studies   Assessment/Plan:  58 y.o. female s/p exploratory laparotomy, lysis of adhesions, resection of previous gastrojejunostomy and jejunojejunostomy, creation of new Roux-en-Y gastrojejunostomy, new feeding jejunostomy.   - We will reach out to IR regarding placement of venting gastrostomy tube given her recurrent nausea/emesis and readmission  - Continue NGT for now; LIS: monitor and record output   - Continue enteric feeds via J-tube; nocturnal  - Monitor abdominal examination; on-going bowel function  - Pain control prn - Antiemetics prn   - Mobilize as feasible; therapies on board  All of the above findings and recommendations were discussed with the patient, and the medical team, and all of patient's questions were  answered to her expressed satisfaction.  -- Lynden Oxford, PA-C [facility] 05/30/2023, 7:04 AM M-F: 7am - 4pm

## 2023-05-31 ENCOUNTER — Other Ambulatory Visit: Payer: PPO

## 2023-05-31 ENCOUNTER — Ambulatory Visit: Payer: Self-pay | Admitting: Psychiatry

## 2023-05-31 DIAGNOSIS — F4329 Adjustment disorder with other symptoms: Secondary | ICD-10-CM | POA: Diagnosis not present

## 2023-05-31 LAB — COMPREHENSIVE METABOLIC PANEL
ALT: 16 U/L (ref 0–44)
AST: 21 U/L (ref 15–41)
Albumin: 1.7 g/dL — ABNORMAL LOW (ref 3.5–5.0)
Alkaline Phosphatase: 86 U/L (ref 38–126)
Anion gap: 10 (ref 5–15)
BUN: 13 mg/dL (ref 6–20)
CO2: 28 mmol/L (ref 22–32)
Calcium: 8 mg/dL — ABNORMAL LOW (ref 8.9–10.3)
Chloride: 102 mmol/L (ref 98–111)
Creatinine, Ser: 0.61 mg/dL (ref 0.44–1.00)
GFR, Estimated: >60 mL/min (ref >60)
Glucose, Bld: 140 mg/dL — ABNORMAL HIGH (ref 70–99)
Potassium: 3.9 mmol/L (ref 3.5–5.1)
Sodium: 140 mmol/L (ref 135–145)
Total Bilirubin: 0.5 mg/dL (ref 0.0–1.2)
Total Protein: 4.6 g/dL — ABNORMAL LOW (ref 6.5–8.1)

## 2023-05-31 LAB — PHOSPHORUS: Phosphorus: 3.6 mg/dL (ref 2.5–4.6)

## 2023-05-31 LAB — COPPER, SERUM: Copper: 57 ug/dL — ABNORMAL LOW (ref 80–158)

## 2023-05-31 LAB — MAGNESIUM: Magnesium: 1.9 mg/dL (ref 1.7–2.4)

## 2023-05-31 MED ORDER — HYDROXYZINE HCL 25 MG PO TABS: 25.0000 mg | ORAL_TABLET | Freq: Four times a day (QID) | ORAL | Status: DC | PRN

## 2023-05-31 NOTE — Progress Notes (Signed)
 Mobility Specialist - Progress Note   05/31/23 1142  Mobility  Activity Ambulated with assistance in hallway  Level of Assistance Standby assist, set-up cues, supervision of patient - no hands on  Assistive Device Front wheel walker  Distance Ambulated (ft) 40 ft  Activity Response Tolerated well  Mobility visit 1 Mobility  Mobility Specialist Start Time (ACUTE ONLY) 1129  Mobility Specialist Stop Time (ACUTE ONLY) 1139  Mobility Specialist Time Calculation (min) (ACUTE ONLY) 10 min   Pt semi fowler upon entry, utilizing RA. Pt motivated and agreeable to OOB amb. Pt reported feeling better this date, denied pain. Pt completed bed mob indep, STS to RW and amb a self selected 40 ft in the hallway with supervision. Pt returned to the room, left semi fowler with alarm set and needs within reach.  Zetta Bills Mobility Specialist 05/31/23 11:46 AM

## 2023-05-31 NOTE — Consult Note (Signed)
 [facility] Psychiatric Consult Initial  Patient Name: .Tanya Nguyen  MRN: [id number]  DOB: [birth year]  Consult Order details:  Orders (From admission, onward)     Start     Ordered   05/28/23 1057  IP CONSULT TO PSYCHIATRY       Ordering Provider: Concha Se, MD  Provider:  (Not yet assigned)  Question Answer Comment  Place call to: [phone]   Reason for Consult Admit      05/28/23 1056   05/28/23 1057  CONSULT TO CALL ACT TEAM       Ordering Provider: Concha Se, MD  Provider:  (Not yet assigned)  Question:  Reason for Consult?  Answer:  depression   05/28/23 1056             Mode of Visit: In person    Psychiatry Consult Evaluation  Service Date: May 31, 2023 LOS:  LOS: 3 days  Chief Complaint Depression, anxiety  Primary Psychiatric Diagnoses  Adjustment disorder with emotional disturbance 2.   3.    Assessment  Tanya Nguyen is a 58 y.o. female admitted: Medicallyfor 05/28/2023 10:28 AM for Tanya Nguyen is a 58 y.o. female s/p recent revision of prior loop gastrojejunostomy with omega loop into a RNY type gastrojejunostomy and insertion of feeding jejunostomy on 05/12/23.  Her post-operative course was slow with slow progression of contrast into the gastrojejunostomy and past the feeding jejunostomy.  She was discharged on 05/27/23.  The patient reports that the same night at home, the family was having issues with the feeding pump beeping all the time, and her feeling more distended and nauseous and had episode of large emesis.  In the ED, she had workup which showed a WBC of 13.5 improved from 16.9 on 05/24/23, albumin 2.0, Cr 0.71, K 3.7.  She had a CT scan of abdomen/pelvis which again showed distention of the stomach and gastrojejunostomy and proximal bowel loops past the jejunojejunostomy. During this hospitalization psychiatry is consulted to evaluate for depression ,anxiety .  On today's assessment patient reports having history of depression and anxiety but  states that she is more stable under her current medications.  She consistently denied SI/HI/intent/plan.  She denies AVH and no overt delusions noted.  She needed some help with sleep as needed medications.  Other than that she had no concerns and did not request any adjustment of her medications.  Patient is psychiatrically stable for discharge once medically cleared.  Diagnoses:  Adjustment disorder with emotional disturbance    Plan   ## Psychiatric Medication Recommendations:  Continue home medications  ## Medical Decision Making Capacity: Not specifically addressed in this encounter  ## Further Work-up:  -- None recommended at this time  -- most recent EKG on 05/30/23 had QtC of 465 -- Pertinent labwork reviewed earlier this admission includes: None   ## Disposition:-- There are no psychiatric contraindications to discharge at this time  ## Behavioral / Environmental: - No specific recommendations at this time.     ## Safety and Observation Level:  - Based on my clinical evaluation, I estimate the patient to be at no risk of self harm in the current setting. - At this time, we recommend  routine. This decision is based on my review of the chart including patient's history and current presentation, interview of the patient, mental status examination, and consideration of suicide risk including evaluating suicidal ideation, plan, intent, suicidal or self-harm behaviors, risk factors, and protective factors. This judgment is based  on our ability to directly address suicide risk, implement suicide prevention strategies, and develop a safety plan while the patient is in the clinical setting. Please contact our team if there is a concern that risk level has changed.  CSSR Risk Category:C-SSRS RISK CATEGORY: No Risk  Suicide Risk Assessment: Patient has following modifiable risk factors for suicide: Chronic medical problems which we are addressing by her health problems. Patient has  following non-modifiable or demographic risk factors for suicide: psychiatric hospitalization Patient has the following protective factors against suicide: Access to outpatient mental health care, Supportive family, Cultural, spiritual, or religious beliefs that discourage suicide, and Frustration tolerance  Thank you for this consult request. Recommendations have been communicated to the primary team.  We will sign off at this time.   Verner Chol, MD       History of Present Illness   Tanya Nguyen is a 58 y.o. female admitted: Medicallyfor 05/28/2023 10:28 AM for Tanya Nguyen is a 58 y.o. female s/p recent revision of prior loop gastrojejunostomy with omega loop into a RNY type gastrojejunostomy and insertion of feeding jejunostomy on 05/12/23.  Her post-operative course was slow with slow progression of contrast into the gastrojejunostomy and past the feeding jejunostomy.  She was discharged on 05/27/23.  The patient reports that the same night at home, the family was having issues with the feeding pump beeping all the time, and her feeling more distended and nauseous and had episode of large emesis.  In the ED, she had workup which showed a WBC of 13.5 improved from 16.9 on 05/24/23, albumin 2.0, Cr 0.71, K 3.7.  She had a CT scan of abdomen/pelvis which again showed distention of the stomach and gastrojejunostomy and proximal bowel loops past the jejunojejunostomy. During this hospitalization psychiatry is consulted to evaluate for depression ,anxiety .  Today on interview patient is very pleasant.  She was able to describe the ongoing medical problems and the need for treatment in the hospital.  She reports that she is a spiritual person and she believes that her body is going to get her out of this safely.  She reports that her bipolar medication is keeping her mood intact.  She reports that she wants to stay strong to complete the treatment.  She talks about losing her mom 1 month ago.  She  understands the process of grieving.  She is looking forward to get better and go home.  She talks about her husband who is very caring.  She did not express any other concern and wants to stay on the current medications with no problems Psych ROS:  Depression: Reports having a good mood, denies feeling hopeless or worthless, denies anhedonia, her appetite and intake is poor due to her GI surgery and condition, her sleep is up-and-down being in the hospital, denies SI/HI/intent/plan Anxiety: Denies any panic attacks Mania (lifetime and current): Denies any recent or current episodes of mania/hypomania, no grandiose delusions noted Psychosis: (lifetime and current): Denies auditory/visual hallucinations, not responding to internal stimuli  Collateral information:  Called husband Mr. Francesconi and left a voicemail    Psychiatric and Social History  Psychiatric History:  Information collected from patient  Prev Dx/Sx: Depression and anxiety, bipolar disorder Current Psych Provider: Dr.Hisads Home Meds (current): Abilify Previous Med Trials: unknown Therapy: none  Prior Psych Hospitalization: At age 55 in [place] Prior Self Harm: At age 34 Prior Violence: None reported  Family Psych History: Mom with depression from he still with drug use  Family Hx suicide: None reported  Social History:  Developmental Hx: Normal Educational Hx: High school Occupational Hx: Retired Armed forces operational officer Hx: None reported Living Situation: With husband, no children, married for 15 years Spiritual Hx: Christine Access to weapons/lethal means: Husband has a gun but has a safety lock on it  Substance History Alcohol: Denies  Tobacco: Denies Illicit drugs: Denies Prescription drug abuse: Denies Rehab hx: Denies  Exam Findings  Physical Exam: Reviewed and agree with the physical exam findings conducted by the medical provider Vital Signs:  Temp:  [97.5 F (36.4 C)-98.6 F (37 C)] 97.5 F (36.4 C) (03/25  1642) Pulse Rate:  [88-110] 105 (03/25 1642) Resp:  [12-19] 18 (03/25 1642) BP: (111-128)/(68-87) 111/74 (03/25 1642) SpO2:  [95 %-99 %] 97 % (03/25 1642) Weight:  [35.3 kg] 35.3 kg (03/25 0342) Blood pressure 111/74, pulse (!) 105, temperature (!) 97.5 F (36.4 C), temperature source Oral, resp. rate 18, height 4\' 11"  (1.499 m), weight 35.3 kg, SpO2 97%. Body mass index is 15.72 kg/m.    Mental Status Exam: General Appearance: Casual and Fairly Groomed  Orientation:  Full (Time, Place, and Person)  Memory:  Immediate;   Fair Recent;   Fair Remote;   Fair  Concentration:  Concentration: Fair and Attention Span: Fair  Recall:  Fair  Attention  Fair  Eye Contact:  Fair  Speech:  Clear and Coherent  Language:  Fair  Volume:  Normal  Mood: fine  Affect:  Appropriate  Thought Process:  Coherent  Thought Content:  Logical  Suicidal Thoughts:  No  Homicidal Thoughts:  No  Judgement:  Fair  Insight:  Fair  Psychomotor Activity:  Normal  Akathisia:  No  Fund of Knowledge:  Good      Assets:  Desire for Improvement Financial Resources/Insurance  Cognition:  WNL  ADL's:  Intact  AIMS (if indicated):        Other History   These have been pulled in through the EMR, reviewed, and updated if appropriate.  Family History:  The patient's family history includes Alcohol abuse in her maternal grandmother and sister; Bipolar disorder in her sister; Breast cancer (age of onset: 73) in her mother; Cancer in her mother; Drug abuse in her sister; Lung cancer in her father; Stroke in her maternal grandmother and paternal grandmother.  Medical History: Past Medical History:  Diagnosis Date   Acquired pyloric stricture    Bipolar disorder (HCC)    Bleeding duodenal ulcer    CAP (community acquired pneumonia) 02/12/2020   Chronic esophagogastric ulcer    Chronic low back pain    Chronic pain syndrome    Constipation 04/02/2014   Depression    Duodenal obstruction    Gastric outlet  obstruction 01/19/2018   GERD (gastroesophageal reflux disease)    Hair loss 09/22/2018   Head injury 12/24/2022   History of stomach ulcers    Hypothyroidism    IDA (iron deficiency anemia)    Incisional hernia, without obstruction or gangrene    Intractable vomiting    Kidney stones    Melena 05/08/2021   Migraine    Neuropathy    Nodule of middle lobe of right lung 09/17/2021   NSAID long-term use 09/16/2016   Pre-diabetes    Protein-calorie malnutrition, severe (HCC)    PTSD (post-traumatic stress disorder)    Right-sided chest pain 09/17/2021   Sacroiliac joint pain 08/14/2018   Side pain 03/30/2018   Small bowel anastomotic dilation    Thrombocytosis  Upper GI bleed 05/18/2021   Vitamin B12 deficiency    Vitamin D deficiency     Surgical History: Past Surgical History:  Procedure Laterality Date   ABDOMINAL HYSTERECTOMY  03/08/2002   partial   bypass gastrojejunostomy     CHOLECYSTECTOMY     COLONOSCOPY WITH PROPOFOL N/A 02/27/2018   Procedure: COLONOSCOPY WITH PROPOFOL;  Surgeon: Midge Minium, MD;  Location: [facility];  Service: Endoscopy;  Laterality: N/A;   ESOPHAGOGASTRODUODENOSCOPY (EGD) WITH PROPOFOL N/A 01/12/2018   Procedure: ESOPHAGOGASTRODUODENOSCOPY (EGD) WITH BIOPSIES;  Surgeon: Midge Minium, MD;  Location: [facility];  Service: Endoscopy;  Laterality: N/A;   ESOPHAGOGASTRODUODENOSCOPY (EGD) WITH PROPOFOL N/A 01/25/2018   Procedure: ESOPHAGOGASTRODUODENOSCOPY (EGD) WITH PROPOFOL;  Surgeon: Wyline Mood, MD;  Location: [facility] ENDOSCOPY;  Service: Gastroenterology;  Laterality: N/A;   ESOPHAGOGASTRODUODENOSCOPY (EGD) WITH PROPOFOL N/A 02/27/2018   Procedure: ESOPHAGOGASTRODUODENOSCOPY (EGD) WITH PROPOFOL;  Surgeon: Midge Minium, MD;  Location: [facility];  Service: Endoscopy;  Laterality: N/A;   ESOPHAGOGASTRODUODENOSCOPY (EGD) WITH PROPOFOL N/A 05/09/2021   Procedure: ESOPHAGOGASTRODUODENOSCOPY (EGD) WITH PROPOFOL;  Surgeon:  Jaynie Collins, DO;  Location: [facility] ENDOSCOPY;  Service: Gastroenterology;  Laterality: N/A;   ESOPHAGOGASTRODUODENOSCOPY (EGD) WITH PROPOFOL N/A 03/09/2022   Procedure: ESOPHAGOGASTRODUODENOSCOPY (EGD) WITH PROPOFOL;  Surgeon: Midge Minium, MD;  Location: [facility] ENDOSCOPY;  Service: Endoscopy;  Laterality: N/A;   ESOPHAGOGASTRODUODENOSCOPY (EGD) WITH PROPOFOL N/A 03/30/2023   Procedure: ESOPHAGOGASTRODUODENOSCOPY (EGD) WITH PROPOFOL;  Surgeon: Midge Minium, MD;  Location: [facility];  Service: Endoscopy;  Laterality: N/A;   GASTROJEJUNOSTOMY N/A 05/12/2023   Procedure: GASTROJEJUNOSTOMY, revision, Lynden Oxford, PA-C to assist, possible feeding jejunostomy;  Surgeon: Henrene Dodge, MD;  Location: [facility] ORS;  Service: General;  Laterality: N/A;   INCISIONAL HERNIA REPAIR N/A 11/23/2018   Procedure: HERNIA REPAIR INCISIONAL;  Surgeon: Henrene Dodge, MD;  Location: [facility] ORS;  Service: General;  Laterality: N/A;   INSERTION OF MESH N/A 11/23/2018   Procedure: INSERTION OF MESH;  Surgeon: Henrene Dodge, MD;  Location: [facility] ORS;  Service: General;  Laterality: N/A;   IR GASTROSTOMY TUBE MOD SED  05/30/2023   LAPAROTOMY N/A 01/20/2018   Procedure: EXPLORATORY LAPAROTOMY;  Surgeon: Henrene Dodge, MD;  Location: [facility] ORS;  Service: General;  Laterality: N/A;   LYSIS OF ADHESION N/A 05/12/2023   Procedure: LYSIS OF ADHESION, open, Lynden Oxford, PA-C to assist;  Surgeon: Henrene Dodge, MD;  Location: [facility] ORS;  Service: General;  Laterality: N/A;   TOOTH EXTRACTION       Medications:   Current Facility-Administered Medications:    acetaminophen (TYLENOL) 160 MG/5ML solution 650 mg, 650 mg, Per J Tube, Q6H PRN, Piscoya, Jose, MD   ARIPiprazole (ABILIFY) tablet 2 mg, 2 mg, Oral, QHS, Piscoya, Jose, MD, 2 mg at 05/30/23 2100   enoxaparin (LOVENOX) injection 30 mg, 30 mg, Subcutaneous, Q24H, Piscoya, Jose, MD, 30 mg at 05/29/23 2209   feeding supplement (OSMOLITE 1.2 CAL) liquid 1,190 mL, 1,190 mL,  Per J Tube, Q24H, Piscoya, Jose, MD, 1,190 mL at 05/30/23 1727   free water 30 mL, 30 mL, Per Tube, Q4H, Piscoya, Jose, MD, 30 mL at 05/31/23 1210   HYDROmorphone (DILAUDID) injection 0.5 mg, 0.5 mg, Intravenous, Q4H PRN, Piscoya, Jose, MD, 0.5 mg at 05/31/23 1244   hydrOXYzine (ATARAX) tablet 25 mg, 25 mg, Per Tube, Q6H PRN, Verner Chol, MD   levothyroxine (SYNTHROID) tablet 50 mcg, 50 mcg, Oral, Q0600, Piscoya, Jose, MD, 50 mcg at 05/31/23 0504   mirtazapine (REMERON) tablet 15 mg, 15 mg,  Per Lily Peer, Jed Limerick, MD, 15 mg at 05/30/23 2100   neomycin-bacitracin-polymyxin 3.5-380 [id number] OINT 1 Application, 1 Application, Topical, Daily, Gilmer Mor, DO, 1 Application at 05/31/23 0845   ondansetron (ZOFRAN-ODT) disintegrating tablet 4 mg, 4 mg, Oral, Q6H PRN **OR** ondansetron (ZOFRAN) injection 4 mg, 4 mg, Intravenous, Q6H PRN, Piscoya, Jose, MD, 4 mg at 05/31/23 1458   oxyCODONE (ROXICODONE) 5 MG/5ML solution 5 mg, 5 mg, Per J Tube, Q4H PRN, Piscoya, Jose, MD, 5 mg at 05/31/23 0504   pantoprazole (PROTONIX) injection 40 mg, 40 mg, Intravenous, QHS, Piscoya, Jose, MD, 40 mg at 05/30/23 2024   polyethylene glycol (MIRALAX / GLYCOLAX) packet 17 g, 17 g, Per J Tube, Daily PRN, Piscoya, Jose, MD   promethazine (PHENERGAN) 12.5 mg in sodium chloride 0.9 % 50 mL IVPB, 12.5 mg, Intravenous, Q6H PRN, Piscoya, Jose, MD   SUMAtriptan (IMITREX) tablet 50 mg, 50 mg, Oral, Q2H PRN, Henrene Dodge, MD  Allergies: Allergies  Allergen Reactions   Penicillins Shortness Of Breath, Diarrhea and Nausea And Vomiting    TOLERATED CEPHALOSPORINS   Nsaids Other (See Comments)    Pt states NSAIDS cause "internal bleeding"   Gabapentin Other (See Comments)    Hair Loss    Pregabalin     Tremors   Sulfa Antibiotics Nausea And Vomiting    Verner Chol, MD

## 2023-05-31 NOTE — Progress Notes (Signed)
 [facility] SURGICAL PROGRESS NOTE  Hospital Day(s): 3.   Interval History:  Patient seen and examined No acute events overnight She is feeling better this morning Abdomen is sore expectedly Nausea improving  No fever, chills Labs are reassuring NGT with 1350 ccs G-tube placed with IR yesterday; capped  She is on nocturnal J-tube feeds Having bowel function   Vital signs in last 24 hours: [min-max] current  Temp:  [97.9 F (36.6 C)-98.6 F (37 C)] 97.9 F (36.6 C) (03/25 0341) Pulse Rate:  [0-125] 104 (03/25 0341) Resp:  [0-34] 18 (03/25 0341) BP: (115-144)/(68-117) 119/80 (03/25 0341) SpO2:  [95 %-100 %] 97 % (03/25 0341) Weight:  [35.3 kg] 35.3 kg (03/25 0342)     Height: 4\' 11"  (149.9 cm) Weight: 35.3 kg BMI (Calculated): 15.71   Intake/Output last 2 shifts:  03/24 0701 - 03/25 0700 In: 768.5 [I.V.:10.6; NG/GT:757.9] Out: 1476 [Urine:125; Emesis/NG output:1350; Stool:1]   Physical Exam:  Constitutional: alert, cooperative and no distress  HEENT: NGT in place  Respiratory: breathing non-labored at rest  Cardiovascular: regular rate and sinus rhythm  Gastrointestinal: soft, incisional soreness worse in upper abdomen, and non-distended. No rebound/guarding. Jejunostomy tube in left abdomen; nocturnal tube feeds running. Now with G-tube in LUQ; clamped  Integumentary: Laparotomy is healing well, steri-strips in place, no erythema    Labs:     Latest Ref Rng & Units 05/29/2023    4:57 AM 05/28/2023   10:05 AM 05/24/2023    6:10 AM  CBC  WBC 4.0 - 10.5 K/uL 9.7  13.5  16.9   Hemoglobin 12.0 - 15.0 g/dL 04.5  40.9  81.1   Hematocrit 36.0 - 46.0 % 29.3  34.3  30.5   Platelets 150 - 400 K/uL 640  864  637       Latest Ref Rng & Units 05/31/2023    4:38 AM 05/30/2023    5:06 AM 05/29/2023    4:57 AM  CMP  Glucose 70 - 99 mg/dL 914  782  956   BUN 6 - 20 mg/dL 13  11  12    Creatinine 0.44 - 1.00 mg/dL 2.13  0.86  5.78   Sodium 135 - 145 mmol/L 140   140  135   Potassium 3.5 - 5.1 mmol/L 3.9  4.1  3.6   Chloride 98 - 111 mmol/L 102  103  100   CO2 22 - 32 mmol/L 28  29  29    Calcium 8.9 - 10.3 mg/dL 8.0  7.7  7.4   Total Protein 6.5 - 8.1 g/dL 4.6  4.5  4.4   Total Bilirubin 0.0 - 1.2 mg/dL 0.5  0.3  0.4   Alkaline Phos 38 - 126 U/L 86  87  88   AST 15 - 41 U/L 21  19  16    ALT 0 - 44 U/L 16  17  16       Imaging studies: No new pertinent imaging studies   Assessment/Plan:  58 y.o. female s/p exploratory laparotomy, lysis of adhesions, resection of previous gastrojejunostomy and jejunojejunostomy, creation of new Roux-en-Y gastrojejunostomy, new feeding jejunostomy.   - We can place gastrostomy tube to gravity this morning  - We can remove NGT once G-tube is placed to gravity  - Hold on formal PO diet today; okay to do small sips and ice chips.  Likely CLD tomorrow  - Continue enteric feeds via J-tube; nocturnal  - Monitor abdominal examination; on-going bowel function  - Pain control  prn - Antiemetics prn   - Mobilize as feasible; therapies on board   - Discharge Planning: Doing well, now with G-tube which will hopefully allow for venting and prevent readmissions. Will need diet advancement. Ideally, she will be able to go home towards the end of the week.   All of the above findings and recommendations were discussed with the patient, and the medical team, and all of patient's questions were answered to her expressed satisfaction.  -- Lynden Oxford, PA-C [facility] 05/31/2023, 7:20 AM M-F: 7am - 4pm

## 2023-05-31 NOTE — Plan of Care (Signed)

## 2023-05-31 NOTE — Progress Notes (Signed)
 Patient s/p venting G tube placement 05/30/23 in IR, seen today for post procedure site check.  Patient reports feeling better today, little nauseous and tired but otherwise doing well.  G-tube to gravity with clear yellow fluid in tubing and bag. Insertion site appropriately tender to palpation, small amount of dried blood underneath the bumper. No active bleeding or drainage. Abdomen is soft. No erythema or edema surrounding the insertion site.  G-tube ok to use immediately for feeds, medications, free water - timing of use per the discretion of primary team.  IR remains available as needed, please call with questions or concerns.  Lynnette Caffey, PA-C

## 2023-06-01 MED ORDER — COPPER 2 MG PO TABS: 2.0000 mg | Freq: Every day | Status: DC

## 2023-06-01 MED ORDER — DEXTROSE 5 % IV SOLN
2.0000 mg | Freq: Every day | INTRAVENOUS | Status: DC
  Administered 2023-06-01 – 2023-06-03 (×3): 2 mg via INTRAVENOUS
  Filled 2023-06-01 (×3): qty 5

## 2023-06-01 MED ORDER — PANCRELIPASE (LIP-PROT-AMYL) 10440-39150 UNITS PO TABS
20880.0000 [IU] | ORAL_TABLET | Freq: Once | ORAL | Status: AC
  Administered 2023-06-01: 20880 [IU]
  Filled 2023-06-01: qty 2

## 2023-06-01 MED ORDER — ZINC SULFATE 220 (50 ZN) MG PO CAPS
220.0000 mg | ORAL_CAPSULE | Freq: Every day | ORAL | Status: DC
Start: 2023-06-02 — End: 2023-07-02
  Administered 2023-06-02 – 2023-06-03 (×2): 220 mg via ORAL
  Filled 2023-06-01 (×2): qty 1

## 2023-06-01 MED ORDER — SODIUM BICARBONATE 650 MG PO TABS
650.0000 mg | ORAL_TABLET | Freq: Once | ORAL | Status: DC
  Filled 2023-06-01: qty 1

## 2023-06-01 MED ORDER — VITAMIN A 3 MG (10000 UNIT) PO CAPS
10000.0000 [IU] | ORAL_CAPSULE | Freq: Every day | ORAL | Status: DC
  Administered 2023-06-02 – 2023-06-03 (×2): 10000 [IU] via ORAL
  Filled 2023-06-01 (×2): qty 1

## 2023-06-01 NOTE — Plan of Care (Signed)

## 2023-06-01 NOTE — TOC Initial Note (Signed)
 Transition of Care [facility]) - Initial/Assessment Note    Patient Details  Name: Tanya Nguyen MRN: [id number] Date of Birth: 08-19-1965  [facility] of [facility] CM/SW Contact:    Chapman Fitch, RN Phone Number: 06/01/2023, 11:36 AM  Clinical Narrative:                  Patient was discharged 3/21 - RW and BSC was delivered - Tube feeds and pump were delivered. Patient and cousin Franchot Erichsen were provided education - Home health was set up with [facility] home health, Patient was readmitted prior to being opened with home health.    Per surgery home tube feeding regimen will not change at discharge.          Patient Goals and CMS Choice            Expected Discharge Plan and Services                                              Prior Living Arrangements/Services                       Activities of Daily Living      Permission Sought/Granted                  Emotional Assessment              Admission diagnosis:  Gastric outlet obstruction [K31.1] Patient Active Problem List   Diagnosis Date Noted   Gastric outlet obstruction 05/12/2023   Nausea vomiting and diarrhea 03/30/2023   Hyponatremia 03/29/2023   Hypomagnesemia 03/29/2023   Coffee ground emesis 03/29/2023   Hypokalemia 03/29/2023   Thrombocytosis 03/29/2023   Prediabetes 11/23/2021   Preventative health care 11/23/2021   Pain management contract signed 09/17/2021   Muscle spasm 09/17/2021   Anxiety 09/02/2021   Iron deficiency anemia 05/18/2021   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   Bilateral foot pain 03/13/2020   Chronic pain syndrome 03/13/2020   Idiopathic peripheral neuropathy 07/23/2019   Urge incontinence 08/11/2018   Protein-calorie malnutrition, severe 01/20/2018   Vitamin D deficiency 07/28/2017   Vitamin B 12 deficiency 07/28/2017   Chronic low back pain 07/28/2017   Plantar fascial fibromatosis of both feet 11/10/2016   Bipolar I disorder, most  recent episode depressed (HCC) 03/01/2015   PTSD (post-traumatic stress disorder) 02/27/2015   Weakness 12/13/2013   Insomnia 08/30/2013   Weight loss 03/22/2013   GERD (gastroesophageal reflux disease)    Migraines    Hypothyroidism    Depression    PCP:  Doreene Nest, NP Pharmacy:   [facility] [address] [place] - [address] [place] [address] Phone: [phone] Fax: [phone]     Social Drivers of Health (SDOH) Social History: SDOH Screenings   Food Insecurity: No Food Insecurity (05/28/2023)  Housing: Low Risk  (05/28/2023)  Transportation Needs: No Transportation Needs (05/28/2023)  Utilities: Not At Risk (05/28/2023)  Depression (PHQ2-9): Medium Risk (03/17/2023)  Social Connections: Socially Integrated (03/30/2023)  Tobacco Use: Low Risk  (05/28/2023)   SDOH Interventions:     Readmission Risk Interventions    05/20/2023   12:24 PM  Readmission Risk Prevention Plan  Transportation Screening Complete  PCP or Specialist Appt within 5-7 Days Complete  Home Care Screening Complete  Medication Review (RN CM) Complete

## 2023-06-01 NOTE — Progress Notes (Signed)
 Nutrition Follow Up Note   DOCUMENTATION CODES:   Severe malnutrition in context of chronic illness  INTERVENTION:   Nocturnal tube feeds of Osmolite 1.2 @85ml /hr x 14 hours overnight (from 1800-0800)- start 3/23  Free water flushes 30ml q4 hours to maintain tube patency   Regimen provides 1428kcal/day, 66g/day protein and 1140ml/day of free water.   Vitamin A 10,000 units po daily x 30 days  Zinc 220mg  po daily x 30 days   Copper chloride 2mg  IV daily x 7 days followed by 2mg  po daily x 30 days.   Daily weights   NUTRITION DIAGNOSIS:   Severe Malnutrition related to chronic illness as evidenced by moderate fat depletion, severe muscle depletion, 27 percent weight loss in 6 months. -ongoing   GOAL:   Patient will meet greater than or equal to 90% of their needs -met   MONITOR:   PO intake, Labs, Weight trends, Skin, I & O's, TF tolerance, Diet advancement  ASSESSMENT:   58 y/o female with h/o chronic pain, GERD, open cholecystectomy, IBS-C, thyroid disease, depression, anxiety, PTSD, bipolar disorder, SI, peripheral neuropathy, IDA, PUD, kindney stones and GOO s/p exploratory laparotomy (with gastrojejunostomy with omega loop/jejunojejunostomy 01/2018) complicated by incisional hernia s/p open incisional hernia repair with mesh underlay 11/2018 and recent admission for malfunctioning gastrojejunostomy s/p exploratory laparotomy with lysis of adhesions, resection of previous gastrojejunostomy and jejunojejunostomy, creation of new Roux-en-Y gastrojejunostomy and new feeding jejunostomy tube (23F) 3/6 and who is now admitted with SBO.  -Pt s/p IR G-tube placement (11F) 3/24   Met with pt in room today. Pt in good spirits and reports she is feeling much better. Pt is happy to have the NGT removed. Pt continues to tolerate her nocturnal tube feeds without issue. G-tube being used for venting as needed. Pt has been eating some broth and gingerale for pleasure. Per chart, pt does  appear to have gained ~1.5lbs since having her J-tube placed. Pt reports that she will have a home health nurse coming out to help her with the tube feeds once she gets back home. Recommend for patient to continue the tube feeds until she is able to maintain at her IBW with oral intake alone. If bowel obstruction resolves and pt is able to tolerate oral intake without vomiting, tube feeds may be able to be switched to bolus feeds via the G-tube which would liberalize pt from the feeding pump. Refeed labs stable.   Vitamin labs low last admission. Pt received one week of IV copper supplementation last admission. Copper is improved but remains low; will provide another week of supplementation. Pt will need to continue oral vitamin supplementation of zinc, copper and vitamin A for 30 days. Plan will be to have PCP follow up to recheck labs within 30-60 days after supplementation is complete; this was discussed with patient.   Medications reviewed and include: lovenox, synthroid, remeron, protonix  Labs reviewed: K 3.9 wnl, P 3.6 wnl, Mg 1.9 wnl Copper 57(L)- 3/23 Folate 11.3 wnl, vitamin D 71.09 wnl, vitamin A 13.5(L), vitamin E 7.5 wnl, B1 113.2 wnl, B12 3,517(H), zinc 18(L), copper 26 (L)- 3/6 Hgb 10.3(L), Hct 29.3(L)  Nutrition Focused Physical Exam:   Flowsheet Row Most Recent Value  Orbital Region Mild depletion  Upper Arm Region Moderate depletion  Thoracic and Lumbar Region Moderate depletion  Buccal Region Mild depletion  Temple Region Moderate depletion  Clavicle Bone Region Severe depletion  Clavicle and Acromion Bone Region Severe depletion  Scapular Bone Region Moderate depletion  Dorsal Hand Mild depletion  Patellar Region Severe depletion  Anterior Thigh Region Severe depletion  Posterior Calf Region Severe depletion  Edema (RD Assessment) None  Hair Reviewed  Eyes Reviewed  Mouth Reviewed  Skin Reviewed  Nails Reviewed   Diet Order:   Diet Order             Diet clear  liquid Fluid consistency: Thin  Diet effective now                  EDUCATION NEEDS:   Education needs have been addressed  Skin:  Skin Assessment: Reviewed RN Assessment (incision abdomen)  Last BM:  3/26- type 6  Height:   Ht Readings from Last 1 Encounters:  05/28/23 4\' 11"  (1.499 m)    Weight:   Wt Readings from Last 1 Encounters:  06/01/23 34.7 kg    Ideal Body Weight:  44.5 kg  Estimated Nutritional Needs:   Kcal:  1200-1400kcal/day  Protein:  60-70g/day  Fluid:  1.1-1.3L/day  Betsey Holiday MS, RD, LDN If unable to be reached, please send secure chat to "RD inpatient" available from 8:00a-4:00p daily

## 2023-06-01 NOTE — Progress Notes (Signed)
 [facility] SUR[facility]ICAL PROGRESS NOTE  Hospital Day(s): 4.   Interval History:  Patient seen and examined Overnight, issues with J-tube clogging This morning, J tube was able to be flushed and working properly  No fever, chills No new labs today NGT removed yesterday G-tube gravity overnight NPO aside from sips/icv chips  She is on nocturnal J-tube feeds Having bowel function   Vital signs in last 24 hours: [min-max] current  Temp:  [97.5 F (36.4 C)-98 F (36.7 C)] 98 F (36.7 C) (03/26 0500) Pulse Rate:  [96-105] 96 (03/26 0500) Resp:  [16-19] 16 (03/26 0500) BP: (111-115)/(71-80) 111/71 (03/26 0500) SpO2:  [97 %-98 %] 98 % (03/26 0500) Weight:  [34.7 kg] 34.7 kg (03/26 0430)     Height: 4\' 11"  (149.9 cm) Weight: 34.7 kg BMI (Calculated): 15.44   Intake/Output last 2 shifts:  03/25 0701 - 03/26 0700 In: -  Out: 500 [Urine:200; Emesis/NG output:300]   Physical Exam:  Constitutional: alert, cooperative and no distress  Respiratory: breathing non-labored at rest  Cardiovascular: regular rate and sinus rhythm  Gastrointestinal: soft, incisional soreness worse in upper abdomen, and non-distended. No rebound/guarding. Jejunostomy tube in left abdomen; nocturnal tube feeds running. Now with G-tube in LUQ; to gravity Integumentary: Laparotomy is healing well, steri-strips in place, no erythema    Labs:     Latest Ref Rng & Units 05/29/2023    4:57 AM 05/28/2023   10:05 AM 05/24/2023    6:10 AM  CBC  WBC 4.0 - 10.5 K/uL 9.7  13.5  16.9   Hemoglobin 12.0 - 15.0 g/dL 16.1  09.6  04.5   Hematocrit 36.0 - 46.0 % 29.3  34.3  30.5   Platelets 150 - 400 K/uL 640  864  637       Latest Ref Rng & Units 05/31/2023    4:38 AM 05/30/2023    5:06 AM 05/29/2023    4:57 AM  CMP  Glucose 70 - 99 mg/dL 409  811  914   BUN 6 - 20 mg/dL 13  11  12    Creatinine 0.44 - 1.00 mg/dL 7.82  9.56  2.13   Sodium 135 - 145 mmol/L 140  140  135   Potassium 3.5 - 5.1 mmol/L 3.9  4.1   3.6   Chloride 98 - 111 mmol/L 102  103  100   CO2 22 - 32 mmol/L 28  29  29    Calcium 8.9 - 10.3 mg/dL 8.0  7.7  7.4   Total Protein 6.5 - 8.1 g/dL 4.6  4.5  4.4   Total Bilirubin 0.0 - 1.2 mg/dL 0.5  0.3  0.4   Alkaline Phos 38 - 126 U/L 86  87  88   AST 15 - 41 U/L 21  19  16    ALT 0 - 44 U/L 16  17  16       Imaging studies: No new pertinent imaging studies   Assessment/Plan:  58 y.o. female s/p exploratory laparotomy, lysis of adhesions, resection of previous gastrojejunostomy and jejunojejunostomy, creation of new Roux-en-Y gastrojejunostomy, new feeding jejunostomy.   - We can start with CLD today  - Clamp G-tube; Okay to drain with gravity as needed if develops significant nausea   - Continue enteric feeds via J-tube; nocturnal - clogging protocol reviewed   - Monitor abdominal examination; on-going bowel function  - Pain control prn - Antiemetics prn   - Mobilize as feasible; therapies on board   - Discharge Planning:  Starting PO diet trial. Potentially home towards end of the week depending on the progress   All of the above findings and recommendations were discussed with the patient, and the medical team, and all of patient's questions were answered to her expressed satisfaction.  -- Lynden Oxford, PA-C  [facility] 06/01/2023, 7:13 AM M-F: 7am - 4pm

## 2023-06-02 NOTE — Care Management Important Message (Signed)
 Important Message  Patient Details  Name: Tanya Nguyen MRN: [id number] Date of Birth: 08-24-1965   Important Message Given:  Yes - Medicare IM     Bernadette Hoit 06/02/2023, 2:49 PM

## 2023-06-02 NOTE — TOC Progression Note (Signed)
 Transition of Care [facility]) - Progression Note    Patient Details  Name: Tanya Nguyen MRN: [id number] Date of Birth: [birth year]  Transition of [facility]) CM/SW Contact  Chapman Fitch, RN Phone Number: 06/02/2023, 11:43 AM  Clinical Narrative:     Attempted to meet with patient at bedside to confirm disposition remains to return home with [facility].  3 visitors currently at bedside.  TOC to follow up at a later time       Expected Discharge Plan and Services                                               Social Determinants of Health (SDOH) Interventions SDOH Screenings   Food Insecurity: No Food Insecurity (05/28/2023)  Housing: Low Risk  (05/28/2023)  Transportation Needs: No Transportation Needs (05/28/2023)  Utilities: Not At Risk (05/28/2023)  Depression (PHQ2-9): Medium Risk (03/17/2023)  Social Connections: Socially Integrated (03/30/2023)  Tobacco Use: Low Risk  (05/28/2023)    Readmission Risk Interventions    05/20/2023   12:24 PM  Readmission Risk Prevention Plan  Transportation Screening Complete  PCP or Specialist Appt within 5-7 Days Complete  Home Care Screening Complete  Medication Review (RN CM) Complete

## 2023-06-02 NOTE — Progress Notes (Signed)
 [facility] SUR[facility]ICAL PROGRESS NOTE  Hospital Day(s): 5.   Interval History:  Patient seen and examined No issues overnight No fever, chills No new labs today G-tube capped; has not needed to utilize gravity  CLD; tolerating well without issue She is on nocturnal J-tube feeds Having bowel function; x5 recorded   Vital signs in last 24 hours: [min-max] current  Temp:  [97.9 F (36.6 C)-98 F (36.7 C)] 97.9 F (36.6 C) (03/26 2102) Pulse Rate:  [105-109] 105 (03/26 2102) Resp:  [18] 18 (03/26 2102) BP: (105-113)/(71-77) 105/77 (03/26 2102) SpO2:  [98 %-99 %] 98 % (03/26 2102) Weight:  [36.1 kg] 36.1 kg (03/27 0430)     Height: 4\' 11"  (149.9 cm) Weight: 36.1 kg BMI (Calculated): 16.07   Intake/Output last 2 shifts:  03/26 0701 - 03/27 0700 In: 2078.3 [NG/GT:1780; IV Piggyback:298.3] Out: 0    Physical Exam:  Constitutional: alert, cooperative and no distress  Respiratory: breathing non-labored at rest  Cardiovascular: regular rate and sinus rhythm  Gastrointestinal: soft, incisional soreness worse in upper abdomen, and non-distended. No rebound/guarding. Jejunostomy tube in left abdomen; nocturnal tube feeds running. Now with G-tube in LUQ; clamped Integumentary: Laparotomy is healing well, steri-strips in place, no erythema    Labs:     Latest Ref Rng & Units 05/29/2023    4:57 AM 05/28/2023   10:05 AM 05/24/2023    6:10 AM  CBC  WBC 4.0 - 10.5 K/uL 9.7  13.5  16.9   Hemoglobin 12.0 - 15.0 g/dL 29.5  28.4  13.2   Hematocrit 36.0 - 46.0 % 29.3  34.3  30.5   Platelets 150 - 400 K/uL 640  864  637       Latest Ref Rng & Units 05/31/2023    4:38 AM 05/30/2023    5:06 AM 05/29/2023    4:57 AM  CMP  Glucose 70 - 99 mg/dL 440  102  725   BUN 6 - 20 mg/dL 13  11  12    Creatinine 0.44 - 1.00 mg/dL 3.66  4.40  3.47   Sodium 135 - 145 mmol/L 140  140  135   Potassium 3.5 - 5.1 mmol/L 3.9  4.1  3.6   Chloride 98 - 111 mmol/L 102  103  100   CO2 22 - 32 mmol/L  28  29  29    Calcium 8.9 - 10.3 mg/dL 8.0  7.7  7.4   Total Protein 6.5 - 8.1 g/dL 4.6  4.5  4.4   Total Bilirubin 0.0 - 1.2 mg/dL 0.5  0.3  0.4   Alkaline Phos 38 - 126 U/L 86  87  88   AST 15 - 41 U/L 21  19  16    ALT 0 - 44 U/L 16  17  16       Imaging studies: No new pertinent imaging studies   Assessment/Plan:  58 y.o. female s/p exploratory laparotomy, lysis of adhesions, resection of previous gastrojejunostomy and jejunojejunostomy, creation of new Roux-en-Y gastrojejunostomy, new feeding jejunostomy.   - Advance to FLD  - Clamp G-tube; Okay to drain with gravity as needed if develops significant nausea   - Continue enteric feeds via J-tube; nocturnal - clogging protocol reviewed   - Monitor abdominal examination; on-going bowel function  - Pain control prn - Antiemetics prn   - Mobilize as feasible; therapies on board   - Discharge Planning: Doing well, diet advancing. If she tolerates FLD without issue, we can potentially DC home  tomorrow (03/28) AM  All of the above findings and recommendations were discussed with the patient, and the medical team, and all of patient's questions were answered to her expressed satisfaction.  -- Lynden Oxford, PA-C [facility] 06/02/2023, 7:28 AM M-F: 7am - 4pm

## 2023-06-02 NOTE — Plan of Care (Signed)

## 2023-06-03 ENCOUNTER — Other Ambulatory Visit: Payer: Self-pay | Admitting: *Deleted

## 2023-06-03 DIAGNOSIS — K311 Adult hypertrophic pyloric stenosis: Secondary | ICD-10-CM

## 2023-06-03 NOTE — Progress Notes (Signed)
 Mobility Specialist - Progress Note   06/03/23 1015  Mobility  Activity Ambulated with assistance in room;Transferred to/from [facility] 2  Level of Assistance Standby assist, set-up cues, supervision of patient - no hands on  Assistive Device Front wheel walker  Distance Ambulated (ft) 12 ft  Activity Response Tolerated well  Mobility visit 1 Mobility  Mobility Specialist Start Time (ACUTE ONLY) 1001  Mobility Specialist Stop Time (ACUTE ONLY) 1009  Mobility Specialist Time Calculation (min) (ACUTE ONLY) 8 min   Pt semi fowler upon entry, utilizing RA. Pt reported stomach pain this date. Pt completed bed mob indep, STS to RW and transferred to the [facility] with supervision. Pt amb to the door and back to bed, tolerated well. Pt returned to bed, left semi fowler with alarm set and needs within reach. RN notified.  Zetta Bills Mobility Specialist 06/03/23 10:37 AM

## 2023-06-03 NOTE — Progress Notes (Signed)
 All Gtube/Jtube teaching completed with family member Synetta Fail who verbalized understanding of all instructions regarding gtube/jtube care with return demonstration noted. Extra supplies sent with pt including gauze, sterile water, tape and two gravity drain bags. Patient and family member Synetta Fail denied having any questions at this time regarding care for the G/J-tube. Upon discharge both drains were patent C/D/I with no complications noted. Pt in good head space and feeling positive about discharge.  Pt made aware of all discharge instructions including upcoming appointment. Pt and family verbalized understanding of all information given.

## 2023-06-03 NOTE — TOC Transition Note (Signed)
 Transition of Care [facility]) - Discharge Note   Patient Details  Name: Tanya Nguyen MRN: [id number] Date of Birth: 10-22-1965  Transition of Care [facility]) CM/SW Contact:  Chapman Fitch, RN Phone Number: 06/03/2023, 10:31 AM   Clinical Narrative:     Per bedside Rn cousin Synetta Fail coming for [place] education this afternoon, then spouse will come to provide transport  Tube feeds, pump, BSC, and RW were delivered last week when discharged from hospital  [facility] with [facility] notified of discharge        Patient Goals and CMS Choice            Discharge Placement                       Discharge Plan and Services Additional resources added to the After Visit Summary for                                       Social Drivers of Health (SDOH) Interventions SDOH Screenings   Food Insecurity: No Food Insecurity (05/28/2023)  Housing: Low Risk  (05/28/2023)  Transportation Needs: No Transportation Needs (05/28/2023)  Utilities: Not At Risk (05/28/2023)  Depression (PHQ2-9): Medium Risk (03/17/2023)  Social Connections: Socially Integrated (03/30/2023)  Tobacco Use: Low Risk  (05/28/2023)     Readmission Risk Interventions    05/20/2023   12:24 PM  Readmission Risk Prevention Plan  Transportation Screening Complete  PCP or Specialist Appt within 5-7 Days Complete  Home Care Screening Complete  Medication Review (RN CM) Complete

## 2023-06-03 NOTE — Plan of Care (Signed)

## 2023-06-03 NOTE — Discharge Summary (Signed)
 [facility] SUR[facility] DISCHARGE SUMMARY  Patient ID: Tanya Nguyen MRN: [id number] DOB/AGE: 59-Oct-1967 59 y.o.  Admit date: 05/28/2023 Discharge date: 06/03/2023  Discharge Diagnoses Patient Active Problem List   Diagnosis Date Noted   Gastric outlet obstruction 05/12/2023   Nausea vomiting and diarrhea 03/30/2023    Consultants Interventional Radiology  Procedures None  HPI: Tanya Nguyen is a 58 y.o. female well known to our service s/p recent revision of prior loop gastrojejunostomy with omega loop into a RNY type gastrojejunostomy and insertion of feeding jejunostomy on 05/12/23. Her post-operative course was slow with slow progression of contrast into the gastrojejunostomy and past the feeding jejunostomy. Eventually, her NG output was lower and she passed multiple NG clamping trials and subsequent advancement of diet to clears and then to full liquids. She was also tolerating J tube feeds and these were transitioned to nocturnal feeds. She was discharged on 05/27/23. The patient reports that the same night at home, the family was having issues with the feeding pump beeping all the time, and her feeling more distended and nauseous and had episode of large emesis.   Hospital Course: NGT was placed and patient admitted to general surgery service. IR was consulted and patient underwent venting gastrostomy tube placement on 03/24. NGT was removed on 03/25 and diet restarted on 03/26 and advanced. She did well and did not need to utilize venting g-tube during admission.  The remainder of patient's hospital course was essentially unremarkable, and discharge planning was initiated accordingly with patient safely able to be discharged home with appropriate discharge instructions, pain control, and outpatient follow-up after all of her questions were answered to her expressed satisfaction.   Discharge Condition: Good   Physical Examination:  Constitutional: alert, cooperative and  no distress  Respiratory: breathing non-labored at rest  Cardiovascular: regular rate and sinus rhythm  Gastrointestinal: soft, incisional soreness worse in upper abdomen, and non-distended. No rebound/guarding. Jejunostomy tube in left abdomen; clamped. Now with G-tube in LUQ; clamped Integumentary: Laparotomy is healing well, no erythema     Allergies as of 06/03/2023       Reactions   Penicillins Shortness Of Breath, Diarrhea, Nausea And Vomiting   TOLERATED CEPHALOSPORINS   Nsaids Other (See Comments)   Pt states NSAIDS cause "internal bleeding"   Gabapentin Other (See Comments)   Hair Loss   Pregabalin    Tremors   Sulfa Antibiotics Nausea And Vomiting        Medication List     TAKE these medications    acetaminophen 500 MG tablet Commonly known as: TYLENOL Take 500-1,000 mg by mouth every 6 (six) hours as needed for moderate pain (pain score 4-6) or headache.   ARIPiprazole 2 MG tablet Commonly known as: ABILIFY Take 1 tablet (2 mg total) by mouth at bedtime.   bisacodyl 5 MG EC tablet Commonly known as: DULCOLAX Take 5 mg by mouth daily as needed for moderate constipation.   diphenhydrAMINE 25 MG tablet Commonly known as: BENADRYL Take 25 mg by mouth every 6 (six) hours as needed for allergies.   hydrOXYzine 25 MG tablet Commonly known as: ATARAX Take 1 tablet (25 mg total) by mouth daily as needed for anxiety.   levothyroxine 50 MCG tablet Commonly known as: SYNTHROID Take 1 tablet by mouth every morning on an empty stomach with water only.  No food or other medications for 30 minutes.   mirtazapine 15 MG tablet Commonly known as: REMERON Take 1 tablet (15 mg total) by mouth  at bedtime.   multivitamin with minerals Tabs tablet Take 1 tablet by mouth daily.   ondansetron 4 MG disintegrating tablet Commonly known as: ZOFRAN-ODT Take 1 tablet (4 mg total) by mouth every 6 (six) hours as needed for nausea.   oxybutynin 5 MG 24 hr tablet Commonly  known as: DITROPAN-XL TAKE 1 TABLET (5 MG TOTAL) BY MOUTH AT BEDTIME. FOR BLADDER INCONTINENCE.   oxyCODONE 5 MG/5ML solution Commonly known as: ROXICODONE Take 5 mLs (5 mg total) by mouth every 4 (four) hours as needed for up to 14 days for moderate pain (pain score 4-6) or severe pain (pain score 7-10).   pantoprazole 40 MG tablet Commonly known as: PROTONIX Take 1 tablet (40 mg total) by mouth daily.   SUMAtriptan 50 MG tablet Commonly known as: IMITREX Take 1 tablet at migraine onset. May repeat in 2 hours if headache persists or recurs.   traMADol 50 MG tablet Commonly known as: ULTRAM Take 1 tablet (50 mg total) by mouth every 12 (twelve) hours as needed.          Follow-up Information     Henrene Dodge, MD. Go on 06/10/2023.   Specialty: General Surgery Why: Go to appointment on 04/04 at 845 AM Contact information: [address] [place] [address] [phone]                  Time spent on discharge management including discussion of hospital course, clinical condition, outpatient instructions, prescriptions, and follow up with the patient and members of the medical team: >30 minutes  -- Lynden Oxford , PA-C [facility]  06/03/2023, 9:15 AM [phone] M-F: 7am - 4pm

## 2023-06-05 DIAGNOSIS — K9423 Gastrostomy malfunction: Secondary | ICD-10-CM | POA: Diagnosis not present

## 2023-06-05 DIAGNOSIS — G629 Polyneuropathy, unspecified: Secondary | ICD-10-CM | POA: Diagnosis not present

## 2023-06-05 DIAGNOSIS — Z9181 History of falling: Secondary | ICD-10-CM | POA: Diagnosis not present

## 2023-06-05 DIAGNOSIS — K311 Adult hypertrophic pyloric stenosis: Secondary | ICD-10-CM | POA: Diagnosis not present

## 2023-06-05 DIAGNOSIS — F32A Depression, unspecified: Secondary | ICD-10-CM | POA: Diagnosis not present

## 2023-06-05 DIAGNOSIS — H9190 Unspecified hearing loss, unspecified ear: Secondary | ICD-10-CM | POA: Diagnosis not present

## 2023-06-05 DIAGNOSIS — Z8701 Personal history of pneumonia (recurrent): Secondary | ICD-10-CM | POA: Diagnosis not present

## 2023-06-05 DIAGNOSIS — K219 Gastro-esophageal reflux disease without esophagitis: Secondary | ICD-10-CM | POA: Diagnosis not present

## 2023-06-05 DIAGNOSIS — G43909 Migraine, unspecified, not intractable, without status migrainosus: Secondary | ICD-10-CM | POA: Diagnosis not present

## 2023-06-05 DIAGNOSIS — E039 Hypothyroidism, unspecified: Secondary | ICD-10-CM | POA: Diagnosis not present

## 2023-06-05 DIAGNOSIS — N200 Calculus of kidney: Secondary | ICD-10-CM | POA: Diagnosis not present

## 2023-06-05 DIAGNOSIS — I700 Atherosclerosis of aorta: Secondary | ICD-10-CM | POA: Diagnosis not present

## 2023-06-05 DIAGNOSIS — D509 Iron deficiency anemia, unspecified: Secondary | ICD-10-CM | POA: Diagnosis not present

## 2023-06-05 DIAGNOSIS — K2970 Gastritis, unspecified, without bleeding: Secondary | ICD-10-CM | POA: Diagnosis not present

## 2023-06-06 ENCOUNTER — Telehealth: Payer: Self-pay

## 2023-06-06 NOTE — Telephone Encounter (Signed)
 Called and advised Tonya with [facility]  of the approval of the requested verbal orders for this patient. Advised to call back with any further questions.

## 2023-06-06 NOTE — Telephone Encounter (Signed)
 Copied from CRM [id number]. Topic: Clinical - Home Health Verbal Orders >> Jun 06, 2023  9:33 AM Raven B wrote: [name]/Agency: Archie Patten with [facility] Number: [phone] Service Requested: Skilled Nursing Frequency: 2 week 1 and 1 week 4  Any new concerns about the patient? No

## 2023-06-06 NOTE — Telephone Encounter (Signed)
 Approved.

## 2023-06-07 ENCOUNTER — Emergency Department: Admitting: Radiology

## 2023-06-07 ENCOUNTER — Other Ambulatory Visit: Payer: Self-pay | Admitting: Surgery

## 2023-06-07 ENCOUNTER — Emergency Department
Admission: EM | Admit: 2023-06-07 | Discharge: 2023-06-07 | Disposition: A | Discharge status: Home / Self Care | Attending: Emergency Medicine | Admitting: Emergency Medicine

## 2023-06-07 ENCOUNTER — Other Ambulatory Visit: Payer: Self-pay

## 2023-06-07 DIAGNOSIS — Y732 Prosthetic and other implants, materials and accessory gastroenterology and urology devices associated with adverse incidents: Secondary | ICD-10-CM | POA: Diagnosis not present

## 2023-06-07 DIAGNOSIS — K9423 Gastrostomy malfunction: Secondary | ICD-10-CM

## 2023-06-07 DIAGNOSIS — T85598A Other mechanical complication of other gastrointestinal prosthetic devices, implants and grafts, initial encounter: Secondary | ICD-10-CM | POA: Insufficient documentation

## 2023-06-07 DIAGNOSIS — K219 Gastro-esophageal reflux disease without esophagitis: Secondary | ICD-10-CM | POA: Insufficient documentation

## 2023-06-07 DIAGNOSIS — K9413 Enterostomy malfunction: Secondary | ICD-10-CM

## 2023-06-07 HISTORY — PX: IR REPLC DUODEN/JEJUNO TUBE PERCUT W/FLUORO: IMG2334

## 2023-06-07 MED ORDER — LIDOCAINE HCL 1 % IJ SOLN
2.0000 mL | Freq: Once | INTRAMUSCULAR | Status: AC
  Administered 2023-06-07: 2 mL via INTRADERMAL

## 2023-06-07 MED ORDER — IOHEXOL 300 MG/ML  SOLN
15.0000 mL | Freq: Once | INTRAMUSCULAR | Status: AC | PRN
  Administered 2023-06-07: 15 mL

## 2023-06-07 MED ORDER — LIDOCAINE HCL 1 % IJ SOLN
INTRAMUSCULAR | Status: AC
  Filled 2023-06-07: qty 20

## 2023-06-07 NOTE — ED Notes (Signed)
 Patient transported to IR

## 2023-06-07 NOTE — ED Triage Notes (Signed)
 Pt to ED via POV from home. Pt reports feeding tube is clogged and was unable to get tube feed last pm. Pt reports continued pain from operation site from 3/22.

## 2023-06-07 NOTE — Consult Note (Signed)
 [facility] SURGICAL CONSULTATION NOTE (initial) - cpt: 16109   HISTORY OF PRESENT ILLNESS (HPI):  57 y.o. female presented to [facility] ED today for evaluation of clogged jejunostomy tube. Patient well known to our service following revision of prior loop gastrojejunostomy with omega loop into a RNY type gastrojejunostomy and insertion of feeding jejunostomy on 05/12/23. Additionally had venting gastrostomy tube placed with IR on 05/30/2023. She was discharged home on 03/28. She called overnight secondary to concerns that he J tube was clogged. She did not get any nocturnal feeds. Our office was able to arrange an appointment with radiology tomorrow (04/02) however patient states she was not aware. She otherwise reports expected abdominal discomfort. No fever, chills, emesis. She is currently using gastrostomy tube to drainage. She, and family, also worried about not having supplies at home. No new labs or imaging.   Surgery is consulted by emergency medicine provider Myah Romeo Apple, PA-C in this context for evaluation and management of clogged jejunostomy tube.  PAST MEDICAL HISTORY (PMH):  Past Medical History:  Diagnosis Date   Acquired pyloric stricture    Bipolar disorder (HCC)    Bleeding duodenal ulcer    CAP (community acquired pneumonia) 02/12/2020   Chronic esophagogastric ulcer    Chronic low back pain    Chronic pain syndrome    Constipation 04/02/2014   Depression    Duodenal obstruction    Gastric outlet obstruction 01/19/2018   GERD (gastroesophageal reflux disease)    Hair loss 09/22/2018   Head injury 12/24/2022   History of stomach ulcers    Hypothyroidism    IDA (iron deficiency anemia)    Incisional hernia, without obstruction or gangrene    Intractable vomiting    Kidney stones    Melena 05/08/2021   Migraine    Neuropathy    Nodule of middle lobe of right lung 09/17/2021   NSAID long-term use 09/16/2016   Pre-diabetes    Protein-calorie  malnutrition, severe (HCC)    PTSD (post-traumatic stress disorder)    Right-sided chest pain 09/17/2021   Sacroiliac joint pain 08/14/2018   Side pain 03/30/2018   Small bowel anastomotic dilation    Thrombocytosis    Upper GI bleed 05/18/2021   Vitamin B12 deficiency    Vitamin D deficiency      PAST SURGICAL HISTORY (PSH):  Past Surgical History:  Procedure Laterality Date   ABDOMINAL HYSTERECTOMY  03/08/2002   partial   bypass gastrojejunostomy     CHOLECYSTECTOMY     COLONOSCOPY WITH PROPOFOL N/A 02/27/2018   Procedure: COLONOSCOPY WITH PROPOFOL;  Surgeon: Midge Minium, MD;  Location: [facility];  Service: Endoscopy;  Laterality: N/A;   ESOPHAGOGASTRODUODENOSCOPY (EGD) WITH PROPOFOL N/A 01/12/2018   Procedure: ESOPHAGOGASTRODUODENOSCOPY (EGD) WITH BIOPSIES;  Surgeon: Midge Minium, MD;  Location: [facility];  Service: Endoscopy;  Laterality: N/A;   ESOPHAGOGASTRODUODENOSCOPY (EGD) WITH PROPOFOL N/A 01/25/2018   Procedure: ESOPHAGOGASTRODUODENOSCOPY (EGD) WITH PROPOFOL;  Surgeon: Wyline Mood, MD;  Location: [facility] ENDOSCOPY;  Service: Gastroenterology;  Laterality: N/A;   ESOPHAGOGASTRODUODENOSCOPY (EGD) WITH PROPOFOL N/A 02/27/2018   Procedure: ESOPHAGOGASTRODUODENOSCOPY (EGD) WITH PROPOFOL;  Surgeon: Midge Minium, MD;  Location: [facility];  Service: Endoscopy;  Laterality: N/A;   ESOPHAGOGASTRODUODENOSCOPY (EGD) WITH PROPOFOL N/A 05/09/2021   Procedure: ESOPHAGOGASTRODUODENOSCOPY (EGD) WITH PROPOFOL;  Surgeon: Jaynie Collins, DO;  Location: [facility] ENDOSCOPY;  Service: Gastroenterology;  Laterality: N/A;   ESOPHAGOGASTRODUODENOSCOPY (EGD) WITH PROPOFOL N/A 03/09/2022   Procedure: ESOPHAGOGASTRODUODENOSCOPY (EGD) WITH PROPOFOL;  Surgeon: Midge Minium, MD;  Location: [facility] ENDOSCOPY;  Service: Endoscopy;  Laterality: N/A;   ESOPHAGOGASTRODUODENOSCOPY (EGD) WITH PROPOFOL N/A 03/30/2023   Procedure: ESOPHAGOGASTRODUODENOSCOPY (EGD) WITH PROPOFOL;   Surgeon: Midge Minium, MD;  Location: [facility] ENDOSCOPY;  Service: Endoscopy;  Laterality: N/A;   GASTROJEJUNOSTOMY N/A 05/12/2023   Procedure: GASTROJEJUNOSTOMY, revision, Lynden Oxford, PA-C to assist, possible feeding jejunostomy;  Surgeon: Henrene Dodge, MD;  Location: [facility] ORS;  Service: General;  Laterality: N/A;   INCISIONAL HERNIA REPAIR N/A 11/23/2018   Procedure: HERNIA REPAIR INCISIONAL;  Surgeon: Henrene Dodge, MD;  Location: [facility] ORS;  Service: General;  Laterality: N/A;   INSERTION OF MESH N/A 11/23/2018   Procedure: INSERTION OF MESH;  Surgeon: Henrene Dodge, MD;  Location: [facility] ORS;  Service: General;  Laterality: N/A;   IR GASTROSTOMY TUBE MOD SED  05/30/2023   LAPAROTOMY N/A 01/20/2018   Procedure: EXPLORATORY LAPAROTOMY;  Surgeon: Henrene Dodge, MD;  Location: [facility] ORS;  Service: General;  Laterality: N/A;   LYSIS OF ADHESION N/A 05/12/2023   Procedure: LYSIS OF ADHESION, open, Lynden Oxford, PA-C to assist;  Surgeon: Henrene Dodge, MD;  Location: [facility] ORS;  Service: General;  Laterality: N/A;   TOOTH EXTRACTION       MEDICATIONS:  Prior to Admission medications   Medication Sig Start Date End Date Taking? Authorizing Provider  acetaminophen (TYLENOL) 500 MG tablet Take 500-1,000 mg by mouth every 6 (six) hours as needed for moderate pain (pain score 4-6) or headache.    [provider]  ARIPiprazole (ABILIFY) 2 MG tablet Take 1 tablet (2 mg total) by mouth at bedtime. 05/11/23 08/09/23  Jomarie Longs, MD  bisacodyl (DULCOLAX) 5 MG EC tablet Take 5 mg by mouth daily as needed for moderate constipation.    [provider]  diphenhydrAMINE (BENADRYL) 25 MG tablet Take 25 mg by mouth every 6 (six) hours as needed for allergies.    [provider]  hydrOXYzine (ATARAX) 25 MG tablet Take 1 tablet (25 mg total) by mouth daily as needed for anxiety. 05/06/23 08/04/23  Neysa Hotter, MD  levothyroxine (SYNTHROID) 50 MCG tablet Take 1 tablet by mouth every morning on  an empty stomach with water only.  No food or other medications for 30 minutes. 05/02/23   Doreene Nest, NP  mirtazapine (REMERON) 15 MG tablet Take 1 tablet (15 mg total) by mouth at bedtime. 05/11/23   Jomarie Longs, MD  Multiple Vitamin (MULTIVITAMIN WITH MINERALS) TABS tablet Take 1 tablet by mouth daily. 04/01/23   Esaw Grandchild A, DO  ondansetron (ZOFRAN-ODT) 4 MG disintegrating tablet Take 1 tablet (4 mg total) by mouth every 6 (six) hours as needed for nausea. 05/27/23   Donovan Kail, PA-C  oxybutynin (DITROPAN-XL) 5 MG 24 hr tablet TAKE 1 TABLET (5 MG TOTAL) BY MOUTH AT BEDTIME. FOR BLADDER INCONTINENCE. 01/21/23   Doreene Nest, NP  oxyCODONE (ROXICODONE) 5 MG/5ML solution Take 5 mLs (5 mg total) by mouth every 4 (four) hours as needed for up to 14 days for moderate pain (pain score 4-6) or severe pain (pain score 7-10). 05/27/23 06/10/23  Donovan Kail, PA-C  pantoprazole (PROTONIX) 40 MG tablet Take 1 tablet (40 mg total) by mouth daily. 05/04/23 04/28/24  Celso Amy, PA-C  SUMAtriptan (IMITREX) 50 MG tablet Take 1 tablet at migraine onset. May repeat in 2 hours if headache persists or recurs. 03/08/23   Doreene Nest, NP  traMADol (ULTRAM) 50 MG tablet Take 1 tablet (50 mg total) by mouth every 12 (twelve)  hours as needed. 02/01/23   Edward Jolly, MD     ALLERGIES:  Allergies  Allergen Reactions   Penicillins Shortness Of Breath, Diarrhea and Nausea And Vomiting    TOLERATED CEPHALOSPORINS   Nsaids Other (See Comments)    Pt states NSAIDS cause "internal bleeding"   Gabapentin Other (See Comments)    Hair Loss    Pregabalin     Tremors   Sulfa Antibiotics Nausea And Vomiting     SOCIAL HISTORY:  Social History   Socioeconomic History   Marital status: Married    Spouse name: Not on file   Number of children: 0   Years of education: 12   Highest education level: High school graduate  Occupational History   Occupation: Homemaker  Tobacco Use    Smoking status: Never    Passive exposure: Never   Smokeless tobacco: Never  Vaping Use   Vaping status: Never Used  Substance and Sexual Activity   Alcohol use: Not Currently    Alcohol/week: 0.0 standard drinks of alcohol   Drug use: No   Sexual activity: Not Currently    Birth control/protection: None  Other Topics Concern   Not on file  Social History Narrative   Lives at home with her husband.   Right-handed.   Rare caffeine.   Social Drivers of Corporate investment banker Strain: Not on file  Food Insecurity: No Food Insecurity (05/28/2023)   Hunger Vital Sign    Worried About Running Out of Food in the Last Year: Never true    Ran Out of Food in the Last Year: Never true  Transportation Needs: No Transportation Needs (05/28/2023)   PRAPARE - Administrator, Civil Service (Medical): No    Lack of Transportation (Non-Medical): No  Physical Activity: Not on file  Stress: Not on file  Social Connections: Socially Integrated (03/30/2023)   Social Connection and Isolation Panel [NHANES]    Frequency of Communication with Friends and Family: Three times a week    Frequency of Social Gatherings with Friends and Family: Never    Attends Religious Services: More than 4 times per year    Active Member of [employer] or Organizations: Yes    Attends Engineer, structural: More than 4 times per year    Marital Status: Married  Catering manager Violence: Not At Risk (05/28/2023)   Humiliation, Afraid, Rape, and Kick questionnaire    Fear of Current or Ex-Partner: No    Emotionally Abused: No    Physically Abused: No    Sexually Abused: No     FAMILY HISTORY:  Family History  Problem Relation Age of Onset   Cancer Mother    Breast cancer Mother 13   Lung cancer Father    Drug abuse Sister    Alcohol abuse Sister    Bipolar disorder Sister    Alcohol abuse Maternal Grandmother    Stroke Maternal Grandmother    Stroke Paternal Grandmother       REVIEW OF  SYSTEMS:  Review of Systems  Constitutional:  Negative for chills and fever.  Respiratory:  Negative for cough and shortness of breath.   Cardiovascular:  Negative for chest pain and palpitations.  Gastrointestinal:  Negative for abdominal pain, diarrhea, nausea and vomiting.       + Clogged J Tube  All other systems reviewed and are negative.   VITAL SIGNS:  Temp:  [97.6 F (36.4 C)-97.8 F (36.6 C)] 97.6 F (36.4 C) (04/01  1419) Pulse Rate:  [93-105] 93 (04/01 1419) Resp:  [17-18] 17 (04/01 1419) BP: (98-155)/(66-75) 155/66 (04/01 1419) SpO2:  [99 %-100 %] 99 % (04/01 1419) Weight:  [34.5 kg] 34.5 kg (04/01 1357)     Height: 4\' 11"  (149.9 cm) Weight: 34.5 kg BMI (Calculated): 15.34   INTAKE/OUTPUT:  No intake/output data recorded.  PHYSICAL EXAM:  Physical Exam Vitals and nursing note reviewed. Exam conducted with a chaperone present.  Constitutional:      General: She is not in acute distress.    Appearance: Normal appearance. She is not ill-appearing.     Comments: Resting in bed; NAD. Bright spirits. Husband at bedside   Eyes:     General: No scleral icterus.    Conjunctiva/sclera: Conjunctivae normal.  Cardiovascular:     Rate and Rhythm: Normal rate.     Pulses: Normal pulses.     Heart sounds: No murmur heard. Pulmonary:     Effort: Pulmonary effort is normal. No respiratory distress.  Abdominal:     General: Abdomen is flat. A surgical scar is present. There is no distension.     Palpations: Abdomen is soft.     Tenderness: There is no guarding or rebound.     Comments: Soft, expected soreness, non-distended. Gastrostomy tube in LUQ; to gravity, minimal drainage. Feeding Jejunostomy inferior to this. Attempts at clearing unsuccessful.   Genitourinary:    Comments: Deferred Skin:    General: Skin is warm and dry.  Neurological:     General: No focal deficit present.     Mental Status: She is alert and oriented to person, place, and time.  Psychiatric:         Mood and Affect: Mood normal.        Behavior: Behavior normal.      Labs:     Latest Ref Rng & Units 05/29/2023    4:57 AM 05/28/2023   10:05 AM 05/24/2023    6:10 AM  CBC  WBC 4.0 - 10.5 K/uL 9.7  13.5  16.9   Hemoglobin 12.0 - 15.0 g/dL 78.2  95.6  21.3   Hematocrit 36.0 - 46.0 % 29.3  34.3  30.5   Platelets 150 - 400 K/uL 640  864  637       Latest Ref Rng & Units 05/31/2023    4:38 AM 05/30/2023    5:06 AM 05/29/2023    4:57 AM  CMP  Glucose 70 - 99 mg/dL 086  578  469   BUN 6 - 20 mg/dL 13  11  12    Creatinine 0.44 - 1.00 mg/dL 6.29  5.28  4.13   Sodium 135 - 145 mmol/L 140  140  135   Potassium 3.5 - 5.1 mmol/L 3.9  4.1  3.6   Chloride 98 - 111 mmol/L 102  103  100   CO2 22 - 32 mmol/L 28  29  29    Calcium 8.9 - 10.3 mg/dL 8.0  7.7  7.4   Total Protein 6.5 - 8.1 g/dL 4.6  4.5  4.4   Total Bilirubin 0.0 - 1.2 mg/dL 0.5  0.3  0.4   Alkaline Phos 38 - 126 U/L 86  87  88   AST 15 - 41 U/L 21  19  16    ALT 0 - 44 U/L 16  17  16       Imaging studies:  No pertinent imaging studies    Assessment/Plan:  58 y.o. female with clogged feeding jejunostomy tube  s/p revision of prior loop gastrojejunostomy with omega loop into a RNY type gastrojejunostomy and insertion of feeding jejunostomy on 05/12/23, complicated by pertinent comorbidities including malnutrition.   - Attempts made at clearing obstruction in jejunostomy tube with wire as well as with instillation of coca-cola. These unfortunately we not successful - I was able to discuss case with IR and appreciate their efforts with attempts at clearing this or replacement.  - She understands that gastrostomy tube can be clamped at home and can utilize gravity drainage as needed for nausea/emesis.  - She can continue diet as tolerated - No indication for surgical intervention     - Discharge Planning: If IR is successful, patient can be discharged home and continue current plan for nocturnal feedings. Patient, and family,  likely need continuous education on how to properly manage these at home (ie: flushing, draining)  All of the above findings and recommendations were discussed with the patient and her family (husband at bedside, daughter Synetta Fail on the phone), and all of their questions were answered to their expressed satisfaction.  Thank you for the opportunity to participate in this patient's care.   -- Lynden Oxford, PA-C [facility] 06/07/2023, 3:11 PM M-F: 7am - 4pm

## 2023-06-07 NOTE — Discharge Instructions (Addendum)
 Follow up with Dr Aleen Campi on Friday for your schedule appointment

## 2023-06-07 NOTE — Procedures (Signed)
 Interventional Radiology Procedure Note  Procedure: Jejunostomy tube replacement under fluoro  Complications: None  Estimated Blood Loss: None  Findings: Unable to declog occluded jejunostomy with brush, wire and flushing.  Replaced under fluoro with new 14 Fr red rubber catheter advanced well into small bowel. OK to use.  Jodi Marble. Fredia Sorrow, M.D Pager:  [phone]

## 2023-06-07 NOTE — ED Notes (Signed)
 Surgeon at bedside to attempt to unclog gtube

## 2023-06-07 NOTE — ED Provider Notes (Signed)
 [facility] Emergency Department Provider Note     Event Date/Time   First MD Initiated Contact with Patient 06/07/23 1255     (approximate)   History   Feeding tube clogged   HPI  Tanya Nguyen is a 58 y.o. female with a history of GERD, gastric outlet obstruction and IDA presents to the ED for needing her feeding tube unclogged s/p revision of gastrojejunostomy and placement of feeding jejunostomy on 05/12/23.  Patient reports she was advised to come to the ED by her surgical team.  No other complaints.     Physical Exam   Triage Vital Signs: ED Triage Vitals  Encounter Vitals Group     BP 06/07/23 1119 98/75     Systolic BP Percentile --      Diastolic BP Percentile --      Pulse Rate 06/07/23 1119 (!) 105     Resp 06/07/23 1119 18     Temp 06/07/23 1120 97.8 F (36.6 C)     Temp Source 06/07/23 1120 Oral     SpO2 06/07/23 1119 100 %     Weight --      Height --      Head Circumference --      Peak Flow --      Pain Score 06/07/23 1119 3     Pain Loc --      Pain Education --      Exclude from Growth Chart --     Most recent vital signs: Vitals:   06/07/23 1419 06/07/23 1635  BP: (!) 155/66 104/70  Pulse: 93 94  Resp: 17 14  Temp: 97.6 F (36.4 C) 97.8 F (36.6 C)  SpO2: 99% 98%    General Awake, no distress.  HEENT NCAT.  CV:  Good peripheral perfusion.  RESP:  Normal effort.  ABD:  No distention. Soft. Non tender. Feeding tube in placed.  Skin around feeding tube appears normal.    ED Results / Procedures / Treatments   Labs (all labs ordered are listed, but only abnormal results are displayed) Labs Reviewed - No data to display  IR Replc Duoden/Jejuno Tube Percut W/Fluoro Result Date: 06/07/2023 INDICATION: Occluded 14 French surgically placed jejunostomy tube. EXAM: REPLACEMENT OF JEJUNOSTOMY FEEDING TUBE UNDER FLUOROSCOPY MEDICATIONS: None ANESTHESIA/SEDATION: None CONTRAST:  15 mL Omnipaque 300-administered into  the gastric lumen. FLUOROSCOPY TIME:  Fluoroscopy Time: 4 minutes and 12 seconds. 4.0 mGy. COMPLICATIONS: None immediate. PROCEDURE: Informed written consent was obtained from the patient after a thorough discussion of the procedural risks, benefits and alternatives. All questions were addressed. Maximal Sterile Barrier Technique was utilized including caps, mask, sterile gowns, sterile gloves, sterile drape, hand hygiene and skin antiseptic. A timeout was performed prior to the initiation of the procedure. Initial attempts were made to unclog an occluded 14 French red rubber jejunostomy utilizing flushing maneuvers, advancement intraluminal brush device and advancement of guidewires. Ultimately, the occluded red rubber catheter was removed and a 5 [place] Kumpe catheter advanced through the jejunostomy tract and back into small bowel lumen. The catheter was further advanced over a hydrophilic guidewire into small bowel. A new 14 French red rubber catheter was then advanced over the wire under fluoroscopy. Catheter position was confirmed by fluoroscopy after injection of contrast. The catheter was secured at the skin with a silk retention suture and a feeding adapter applied at the catheter exit site. FINDINGS: Maneuvers were unsuccessful in unclogging the occluded jejunostomy catheter. Fluoroscopy demonstrates some relative  kinking of the distal segment of the catheter in the small bowel. The occluded catheter was successfully exchanged over a guidewire for a new 14 French red rubber catheter which was advanced well into the small bowel. This catheter is widely patent and may be used immediately. IMPRESSION: 1. Inability to unclog occluded jejunostomy catheter. 2. Replacement of occluded jejunostomy catheter for new 14 French red rubber catheter advanced into the small bowel under fluoroscopy. This catheter is ready for immediate use. Electronically Signed   By: Irish Lack M.D.   On: 06/07/2023 16:31     PROCEDURES:  Critical Care performed: No  Procedures   MEDICATIONS ORDERED IN ED: Medications  lidocaine (XYLOCAINE) 1 % (with pres) injection 2 mL (2 mLs Intradermal Given 06/07/23 1623)  iohexol (OMNIPAQUE) 300 MG/ML solution 15 mL (15 mLs Per Tube Contrast Given 06/07/23 1623)     IMPRESSION / MDM / ASSESSMENT AND PLAN / ED COURSE  I reviewed the triage vital signs and the nursing notes.                              Clinical Course as of 06/07/23 1932  Tue Jun 07, 2023  1432 Consulted with Laqueta Due- General Surgery who will come to the ED to assess patient. Requested DobHoff at bedside. [MH]  1614 Patient in IR  [MH]  1619 Dr. Fredia Sorrow was able to get the feeding tube exchanged, new 18f red rubber.  [MH]    Clinical Course User Index [MH] Kern Reap A, PA-C    58 y.o. female presents to the emergency department for evaluation and treatment of clogged feeding tube. See HPI for further details.   Differential diagnosis includes, but is not limited to clogged feeding tube  Patient's presentation is most consistent with acute complicated illness / injury requiring diagnostic workup.  Patient is alert and oriented.  She is hemodynamically stable.  Consulted with general surgery who came to the ED and assessed patient.  Patient was taken to IR for a feeding tube exchange which was successful.  Patient return to the ED and is in stable condition for discharge home.  Has a follow-up appointment with Dr. Dory Larsen on Friday.   FINAL CLINICAL IMPRESSION(S) / ED DIAGNOSES   Final diagnoses:  Clogged feeding tube     Rx / DC Orders   ED Discharge Orders     None        Note:  This document was prepared using Dragon voice recognition software and may include unintentional dictation errors.    Romeo Apple, Ettamae Barkett A, PA-C 06/07/23 1934    Delton Prairie, MD 06/08/23 [phone]

## 2023-06-08 ENCOUNTER — Telehealth: Payer: Self-pay

## 2023-06-08 ENCOUNTER — Ambulatory Visit: Admitting: Radiology

## 2023-06-08 NOTE — Progress Notes (Signed)
 Complex Care Management Note Care Guide Note  06/08/2023 Name: Tanya Nguyen MRN: [id number] DOB: [birth year]   Complex Care Management Outreach Attempts: An unsuccessful telephone outreach was attempted today to offer the patient information about available complex care management services.  Follow Up Plan:  Additional outreach attempts will be made to offer the patient complex care management information and services.   Encounter Outcome:  No Answer  Penne Lash , RMA     [place]  [facility], [facility] Guide  Direct Dial: [phone]  Website: [email or web address]

## 2023-06-10 ENCOUNTER — Encounter: Payer: Self-pay | Admitting: Surgery

## 2023-06-10 ENCOUNTER — Ambulatory Visit: Admitting: Surgery

## 2023-06-10 VITALS — BP 112/80 | HR 125 | Temp 97.9°F | Ht 59.0 in | Wt 76.2 lb

## 2023-06-10 DIAGNOSIS — R112 Nausea with vomiting, unspecified: Secondary | ICD-10-CM

## 2023-06-10 DIAGNOSIS — Z980 Intestinal bypass and anastomosis status: Secondary | ICD-10-CM

## 2023-06-10 DIAGNOSIS — R197 Diarrhea, unspecified: Secondary | ICD-10-CM

## 2023-06-10 NOTE — Patient Instructions (Signed)
 Surgery to Remove Scarring Inside the Belly (Laparoscopic Lysis of Abdominal Adhesions): What to Know After After a surgery called laparoscopic lysis to remove scarring inside your belly, it's common to have pain, bruising, or swelling where the cuts were made. You may also have minor discomfort in your belly. Follow these instructions at home: Medicines Take your medicines only as told. If you were given antibiotics, take them as told. Do not stop taking them even if you start to feel better. You may need to take steps to help treat or prevent trouble pooping (constipation), such as: Taking medicines to help you poop. Eating foods high in fiber, like beans, whole grains, and fresh fruits and vegetables. Drinking more fluids as told. Ask your health care provider if it's safe to drive or use machines while taking your medicine. Caring for your cuts from surgery  Take care of your cuts as told. Make sure you: Wash your hands with soap and water for at least 20 seconds before and after you change your bandage. If you can't use soap and water, use hand sanitizer. Change your bandage. Leave stitches or skin glue alone. Leave tape strips alone unless you're told to take them off. You may trim the edges of the tape strips if they curl up. Check the areas around your cuts every day for signs of infection. Check for: More redness, swelling, or pain. Fluid or blood. Warmth. Pus or a bad smell. Activity If you were given a sedative, do not drive or use machines until you're told it's safe. A sedative can make you sleepy. Rest as told. Get up to take short walks at least every 2 hours during the day. This helps you breathe better and keeps your blood flowing. Ask for help if you feel weak or unsteady. Ask if it's OK for you to lift. Ask what things are safe for you to do at home. Ask when you can go back to work or school. General instructions Do not smoke, vape, or use nicotine or tobacco. Doing  this can slow down healing. Do not take baths, swim, or use a hot tub until you're told it's OK. Ask if you can shower. Wear compression stockings to reduce swelling and help prevent blood clots in your legs. Keep all follow-up visits. You need to have your cuts checked to make sure they're healing. Your provider may give you more instructions. Make sure you know what you can and can't do. Contact a health care provider if: You have a fever. You have any signs of infection. You feel faint or light-headed. You throw up or feel like you may throw up. You can't poop or pass gas. Your pain doesn't get better with medicine. You get a rash. Get help right away if: You have very bad pain in your belly or chest. You have trouble breathing. You throw up each time you eat or drink. Your cuts start to open up. These symptoms may be an emergency. Call 911 right away. Do not wait to see if the symptoms will go away. Do not drive yourself to the hospital. This information is not intended to replace advice given to you by your health care provider. Make sure you discuss any questions you have with your health care provider. Document Revised: 10/20/2022 Document Reviewed: 10/20/2022 Elsevier Patient Education  2024 [employer].

## 2023-06-10 NOTE — Progress Notes (Signed)
 06/10/2023  HPI: Tanya Nguyen is a 58 y.o. female s/p exploratory laparotomy with revision of her prior loop gastrojejunostomy into a Roux-en-Y type of gastrojejunostomy with placement of a feeding J tube on 05/12/23.  She had a prolonged course due to slow transition through her GJ and past her feeding tube.  She eventually required a percutaneous G tube placement with IR on 05/30/23 to help with venting of her stomach as needed.  Most recently, she presented to the ED on 06/07/23 with a clogged J tube and this was replaced by IR as well.  She continues doing nocturnal J tube feeds.  She has been venting her G tube as needed but is otherwise able to tolerate a full liquid diet in addition to her J tube feeds.  Reports pain is overall stable and her nausea is controlled.  Vital signs: BP 112/80   Pulse (!) 125   Temp 97.9 F (36.6 C) (Oral)   Ht 4\' 11"  (1.499 m)   Wt 76 lb 3.2 oz (34.6 kg)   SpO2 98%   BMI 15.39 kg/m    Physical Exam: Constitutional: No acute distress Abdomen:  soft, non-distended, appropriately sore to palpation.  Midline incision continues to heal well and is clean, dry, intact.  J tube and G tube sites are clean without infection.  Assessment/Plan: This is a 58 y.o. female s/p revision of prior loop GJ into RNY GJ and placement of feeding J tube, eventual addition of venting G tube.  --Patient's tubes are working well for feeding and venting without any issues recently since the J tube was replaced.  She's tolerating a full liquid diet with intermittent venting of her G tube. --For now, continue J tube feeds at nighttime, continue venting G tube as needed, continue full liquid diet po as tolerated.  Flush tube as instructed. --Will give prescription for drainage bags for her G tube. --Follow up in 1 month to recheck on her progress and we'll likely evaluate nutrition labs at that point.   Howie Ill, MD [facility]

## 2023-06-13 ENCOUNTER — Telehealth: Payer: Self-pay

## 2023-06-13 NOTE — Telephone Encounter (Signed)
 Defer to her provider Dr.Hisada

## 2023-06-13 NOTE — Telephone Encounter (Signed)
 She may discontinue both mirtazapine and hydroxyzine if they make her feel unwell. Please advise her to schedule an in-person follow-up appointment for 30 minutes as well.

## 2023-06-13 NOTE — Telephone Encounter (Signed)
 pt states that she been in the hospital for about 3 weeks she has had 2 bag placed. she states that the hydroxyzine and the mirtazapine is making her sick no that she had the bags placed. wanted to know if she can stop medications.

## 2023-06-14 ENCOUNTER — Other Ambulatory Visit (INDEPENDENT_AMBULATORY_CARE_PROVIDER_SITE_OTHER): Payer: PPO

## 2023-06-14 ENCOUNTER — Ambulatory Visit: Admitting: Psychiatry

## 2023-06-14 DIAGNOSIS — E038 Other specified hypothyroidism: Secondary | ICD-10-CM

## 2023-06-14 LAB — TSH: TSH: 36.75 u[IU]/mL — ABNORMAL HIGH (ref 0.35–5.50)

## 2023-06-14 LAB — T4, FREE: Free T4: 0.95 ng/dL (ref 0.60–1.60)
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
## 2023-06-15 ENCOUNTER — Telehealth: Payer: Self-pay | Admitting: Primary Care

## 2023-06-15 NOTE — Telephone Encounter (Signed)
-----   Message from [facility] T sent at 06/15/2023 11:14 AM EDT ----- Reason for CRM: The patient called back regarding her lab work. I read the note to her verbatim and she expressed understanding. I was unable to schedule the lab appointment as the lab orders were not on file. Her call back number is [phone].

## 2023-06-15 NOTE — Telephone Encounter (Signed)
 Spoke with patient and relayed lab results to her. She scheduled her 2 month lab visit and stated that she understands to take 75 MCG daily.

## 2023-06-15 NOTE — Telephone Encounter (Signed)
Noted. See result note for further documentation.  

## 2023-06-16 ENCOUNTER — Telehealth: Payer: Self-pay | Admitting: Surgery

## 2023-06-16 MED ORDER — OXYCODONE HCL 5 MG/5ML PO SOLN: 5.0000 mg | Freq: Four times a day (QID) | ORAL | 0 refills | Status: DC | PRN

## 2023-06-16 NOTE — Telephone Encounter (Signed)
 Pt said that she is getting low on liquid oxycodone 5mg  . Needs refill sent  [facility] [address]. Pt phone # [phone]

## 2023-06-17 ENCOUNTER — Emergency Department: Admitting: Radiology

## 2023-06-17 ENCOUNTER — Emergency Department
Admission: EM | Admit: 2023-06-17 | Discharge: 2023-06-17 | Disposition: A | Discharge status: Home / Self Care | Attending: Emergency Medicine | Admitting: Emergency Medicine

## 2023-06-17 ENCOUNTER — Other Ambulatory Visit: Payer: Self-pay

## 2023-06-17 DIAGNOSIS — K9423 Gastrostomy malfunction: Secondary | ICD-10-CM | POA: Diagnosis not present

## 2023-06-17 DIAGNOSIS — K9419 Other complications of enterostomy: Secondary | ICD-10-CM | POA: Diagnosis not present

## 2023-06-17 DIAGNOSIS — T85528A Displacement of other gastrointestinal prosthetic devices, implants and grafts, initial encounter: Secondary | ICD-10-CM

## 2023-06-17 DIAGNOSIS — Z934 Other artificial openings of gastrointestinal tract status: Secondary | ICD-10-CM

## 2023-06-17 DIAGNOSIS — K9413 Enterostomy malfunction: Secondary | ICD-10-CM | POA: Diagnosis not present

## 2023-06-17 HISTORY — PX: IR REPLC GASTRO/COLONIC TUBE PERCUT W/FLUORO: IMG2333

## 2023-06-17 MED ORDER — LIDOCAINE HCL 1 % IJ SOLN
INTRAMUSCULAR | Status: AC
  Filled 2023-06-17: qty 20

## 2023-06-17 MED ORDER — IOHEXOL 300 MG/ML  SOLN
10.0000 mL | Freq: Once | INTRAMUSCULAR | Status: AC | PRN
  Administered 2023-06-17: 10 mL

## 2023-06-17 MED ORDER — LIDOCAINE VISCOUS HCL 2 % MT SOLN
OROMUCOSAL | Status: AC
  Filled 2023-06-17: qty 15

## 2023-06-17 MED ORDER — OXYCODONE HCL 5 MG/5ML PO SOLN
5.0000 mg | ORAL | Status: AC
  Administered 2023-06-17: 5 mg via ORAL
  Filled 2023-06-17: qty 5

## 2023-06-17 MED ORDER — LIDOCAINE HCL 1 % IJ SOLN
10.0000 mL | Freq: Once | INTRAMUSCULAR | Status: AC
  Administered 2023-06-17: 1 mL via INTRADERMAL

## 2023-06-17 MED ORDER — LIDOCAINE VISCOUS HCL 2 % MT SOLN
3.0000 mL | Freq: Once | OROMUCOSAL | Status: AC
  Administered 2023-06-17: 3 mL via OROMUCOSAL

## 2023-06-17 NOTE — ED Notes (Signed)
 Patient's Gastro/jejunostomy tube is dislodged.

## 2023-06-17 NOTE — ED Notes (Signed)
 Waiting for transport. Bag was changed on PEG.

## 2023-06-17 NOTE — ED Notes (Signed)
 Patient taken to IR by IR tech.

## 2023-06-17 NOTE — ED Triage Notes (Signed)
 Patient comes in from home after he feeding tube came out. Pt states that her feeding tube came out about 45 minutes ago. Pt doesn't complain of any pain at this time. Pt is alert and oriented x4, with no signs of acute distress at this time.

## 2023-06-17 NOTE — ED Provider Notes (Signed)
 [facility] Provider Note    Event Date/Time   First MD Initiated Contact with Patient 06/17/23 1503     (approximate)   History   Chief Complaint: dislodged feeding tube   HPI  Tanya Nguyen is a 58 y.o. female with bipolar disorder, PTSD, complicated upper GI surgical history including recent gastrojejunostomy surgical revision resulting in placement of a jejunostomy feeding tube and a separate gastrostomy tube for vent/drainage.  Patient reports being in her usual state of health and then inadvertently removed the jejunostomy tube earlier today.  Complains of some pain at the stoma site.  No other complaints.  No vomiting.          Physical Exam   Triage Vital Signs: ED Triage Vitals  Encounter Vitals Group     BP 06/17/23 1419 117/77     Systolic BP Percentile --      Diastolic BP Percentile --      Pulse Rate 06/17/23 1419 (!) 108     Resp --      Temp 06/17/23 1419 97.6 F (36.4 C)     Temp src --      SpO2 06/17/23 1419 100 %     Weight 06/17/23 1422 72 lb 14.4 oz (33.1 kg)     Height 06/17/23 1422 4\' 11"  (1.499 m)     Head Circumference --      Peak Flow --      Pain Score 06/17/23 1422 0     Pain Loc --      Pain Education --      Exclude from Growth Chart --     Most recent vital signs: Vitals:   06/17/23 1515 06/17/23 1615  BP: 115/73 108/69  Pulse: (!) 102 100  Temp:    SpO2: 100% 100%    General: Awake, no distress.  CV:  Good peripheral perfusion.  Resp:  Normal effort.  Abd:  No distention.  Soft, nontender.  There is mild discomfort at the stoma site of the displaced jejunostomy tube where suture anchor was torn loose.  No bleeding.    ED Results / Procedures / Treatments   Labs (all labs ordered are listed, but only abnormal results are displayed) Labs Reviewed - No data to display   EKG    RADIOLOGY    PROCEDURES:  Procedures   MEDICATIONS ORDERED IN ED: Medications  oxyCODONE  (ROXICODONE) 5 MG/5ML solution 5 mg (5 mg Oral Given 06/17/23 1715)  lidocaine (XYLOCAINE) 1 % (with pres) injection 10 mL (1 mL Intradermal Given 06/17/23 1653)  lidocaine (XYLOCAINE) 2 % viscous mouth solution 3 mL (3 mLs Mouth/Throat Given 06/17/23 1637)  iohexol (OMNIPAQUE) 300 MG/ML solution 10 mL (10 mLs Per Tube Contrast Given 06/17/23 1637)     IMPRESSION / MDM / ASSESSMENT AND PLAN / ED COURSE  I reviewed the triage vital signs and the nursing notes.   Patient's presentation is most consistent with acute presentation with potential threat to life or bodily function.  Patient presents with inadvertent removal of her feeding jejunostomy tube.  Otherwise in baseline state of health.  She is not dehydrated, doubt GI perforation.  Discussed with surgery who recommends new jejunostomy tube placement by IR, after which patient would be suitable for discharge and follow-up in clinic.  Discussed with interventional radiology who can place the tube right away.   ----------------------------------------- 5:26 PM on 06/17/2023 ----------------------------------------- Procedure completed, tube in satisfactory position confirmed by fluoroscopy.  Stable for discharge  FINAL CLINICAL IMPRESSION(S) / ED DIAGNOSES   Final diagnoses:  Dislodged jejunostomy tube  Feeding intolerance managed with jejunostomy tube (HCC)     Rx / DC Orders   ED Discharge Orders     None        Note:  This document was prepared using Dragon voice recognition software and may include unintentional dictation errors.   Sharman Cheek, MD 06/17/23 1726

## 2023-06-17 NOTE — ED Notes (Signed)
 Patient states she is due for her oxycodone which she can take by mouth.

## 2023-06-17 NOTE — ED Notes (Signed)
 Patient's family member is here to transport her home.

## 2023-06-17 NOTE — Procedures (Signed)
 Interventional Radiology Procedure Note  Procedure: Replacement of displaced 74F red rubber catheter direct J-tube  Complications: None  Estimated Blood Loss: None  Recommendations: - Ready for use - We will schedule for pt to return to IR to upgrade to a better tube to prevent future displacement  Signed,  Sterling Big, MD

## 2023-06-17 NOTE — ED Notes (Signed)
 Supply chain contacted for 14Fr red rubber catheter.

## 2023-06-20 ENCOUNTER — Observation Stay

## 2023-06-20 ENCOUNTER — Telehealth: Payer: Self-pay | Admitting: Psychiatry

## 2023-06-20 ENCOUNTER — Ambulatory Visit: Admitting: Psychiatry

## 2023-06-20 ENCOUNTER — Other Ambulatory Visit: Payer: Self-pay

## 2023-06-20 ENCOUNTER — Telehealth: Payer: Self-pay | Admitting: Student in an Organized Health Care Education/Training Program

## 2023-06-20 ENCOUNTER — Observation Stay
Admission: EM | Admit: 2023-06-20 | Discharge: 2023-06-22 | Disposition: A | Discharge status: Home / Self Care | Attending: Surgery | Admitting: Surgery

## 2023-06-20 DIAGNOSIS — R109 Unspecified abdominal pain: Secondary | ICD-10-CM | POA: Diagnosis not present

## 2023-06-20 DIAGNOSIS — K9423 Gastrostomy malfunction: Principal | ICD-10-CM | POA: Insufficient documentation

## 2023-06-20 DIAGNOSIS — R2681 Unsteadiness on feet: Secondary | ICD-10-CM | POA: Insufficient documentation

## 2023-06-20 DIAGNOSIS — K219 Gastro-esophageal reflux disease without esophagitis: Secondary | ICD-10-CM | POA: Diagnosis not present

## 2023-06-20 DIAGNOSIS — N200 Calculus of kidney: Secondary | ICD-10-CM | POA: Diagnosis not present

## 2023-06-20 DIAGNOSIS — F319 Bipolar disorder, unspecified: Secondary | ICD-10-CM | POA: Diagnosis not present

## 2023-06-20 DIAGNOSIS — T85598A Other mechanical complication of other gastrointestinal prosthetic devices, implants and grafts, initial encounter: Secondary | ICD-10-CM | POA: Diagnosis present

## 2023-06-20 DIAGNOSIS — K9413 Enterostomy malfunction: Secondary | ICD-10-CM | POA: Diagnosis not present

## 2023-06-20 DIAGNOSIS — K6389 Other specified diseases of intestine: Secondary | ICD-10-CM | POA: Diagnosis not present

## 2023-06-20 DIAGNOSIS — Z9049 Acquired absence of other specified parts of digestive tract: Secondary | ICD-10-CM | POA: Diagnosis not present

## 2023-06-20 LAB — CBC
HCT: 36.2 % (ref 36.0–46.0)
Hemoglobin: 12.3 g/dL (ref 12.0–15.0)
MCH: 31.9 pg (ref 26.0–34.0)
MCHC: 34 g/dL (ref 30.0–36.0)
MCV: 94 fL (ref 80.0–100.0)
Platelets: 421 10*3/uL — ABNORMAL HIGH (ref 150–400)
RBC: 3.85 MIL/uL — ABNORMAL LOW (ref 3.87–5.11)
RDW: 17.5 % — ABNORMAL HIGH (ref 11.5–15.5)
WBC: 9 10*3/uL (ref 4.0–10.5)
nRBC: 0 % (ref 0.0–0.2)

## 2023-06-20 LAB — COMPREHENSIVE METABOLIC PANEL WITH GFR
ALT: 32 U/L (ref 0–44)
AST: 30 U/L (ref 15–41)
Albumin: 2 g/dL — ABNORMAL LOW (ref 3.5–5.0)
Alkaline Phosphatase: 99 U/L (ref 38–126)
Anion gap: 9 (ref 5–15)
BUN: 12 mg/dL (ref 6–20)
CO2: 26 mmol/L (ref 22–32)
Calcium: 7.8 mg/dL — ABNORMAL LOW (ref 8.9–10.3)
Chloride: 96 mmol/L — ABNORMAL LOW (ref 98–111)
Creatinine, Ser: 0.46 mg/dL (ref 0.44–1.00)
GFR, Estimated: >60 mL/min (ref >60)
Glucose, Bld: 98 mg/dL (ref 70–99)
Potassium: 3.5 mmol/L (ref 3.5–5.1)
Sodium: 131 mmol/L — ABNORMAL LOW (ref 135–145)
Total Bilirubin: 0.6 mg/dL (ref 0.0–1.2)
Total Protein: 4.8 g/dL — ABNORMAL LOW (ref 6.5–8.1)

## 2023-06-20 LAB — LIPASE, BLOOD: Lipase: 55 U/L — ABNORMAL HIGH (ref 11–51)

## 2023-06-20 MED ORDER — MIRTAZAPINE 15 MG PO TABS
15.0000 mg | ORAL_TABLET | Freq: Every day | ORAL | Status: DC
  Administered 2023-06-20: 15 mg via ORAL

## 2023-06-20 MED ORDER — LEVOTHYROXINE SODIUM 50 MCG PO TABS
50.0000 ug | ORAL_TABLET | Freq: Every day | ORAL | Status: DC
  Administered 2023-06-21 – 2023-06-22 (×2): 50 ug via ORAL
  Filled 2023-06-20 (×2): qty 1

## 2023-06-20 MED ORDER — ACETAMINOPHEN 500 MG PO TABS
1000.0000 mg | ORAL_TABLET | Freq: Four times a day (QID) | ORAL | Status: DC
  Administered 2023-06-20 – 2023-06-22 (×5): 1000 mg via ORAL
  Filled 2023-06-20 (×8): qty 2

## 2023-06-20 MED ORDER — OXYBUTYNIN CHLORIDE ER 5 MG PO TB24
5.0000 mg | ORAL_TABLET | Freq: Every day | ORAL | Status: DC
  Filled 2023-06-20 (×2): qty 1

## 2023-06-20 MED ORDER — MIRTAZAPINE 15 MG PO TABS
15.0000 mg | ORAL_TABLET | Freq: Every day | ORAL | Status: DC
  Administered 2023-06-20 – 2023-06-21 (×2): 15 mg via ORAL
  Filled 2023-06-20 (×2): qty 1

## 2023-06-20 MED ORDER — PANTOPRAZOLE SODIUM 40 MG IV SOLR: 40.0000 mg | Freq: Every day | INTRAVENOUS | Status: DC

## 2023-06-20 MED ORDER — ARIPIPRAZOLE 2 MG PO TABS
2.0000 mg | ORAL_TABLET | Freq: Every day | ORAL | Status: DC
  Administered 2023-06-20 – 2023-06-21 (×2): 2 mg via ORAL
  Filled 2023-06-20 (×2): qty 1

## 2023-06-20 MED ORDER — OXYBUTYNIN CHLORIDE ER 5 MG PO TB24
5.0000 mg | ORAL_TABLET | Freq: Every day | ORAL | Status: DC
  Administered 2023-06-20 – 2023-06-21 (×2): 5 mg via ORAL
  Filled 2023-06-20 (×2): qty 1

## 2023-06-20 MED ORDER — HYDROMORPHONE HCL 1 MG/ML IJ SOLN
0.5000 mg | INTRAMUSCULAR | Status: DC | PRN
  Administered 2023-06-20 – 2023-06-22 (×5): 0.5 mg via INTRAVENOUS
  Filled 2023-06-20 (×5): qty 0.5

## 2023-06-20 MED ORDER — HYDROXYZINE HCL 25 MG PO TABS
25.0000 mg | ORAL_TABLET | Freq: Every day | ORAL | Status: DC | PRN
  Administered 2023-06-21: 25 mg via ORAL
  Filled 2023-06-20: qty 1

## 2023-06-20 MED ORDER — PANTOPRAZOLE SODIUM 40 MG IV SOLR
40.0000 mg | Freq: Every day | INTRAVENOUS | Status: DC
  Administered 2023-06-20 – 2023-06-21 (×2): 40 mg via INTRAVENOUS
  Filled 2023-06-20 (×2): qty 10

## 2023-06-20 MED ORDER — KCL IN DEXTROSE-NACL 20-5-0.9 MEQ/L-%-% IV SOLN
INTRAVENOUS | Status: AC
  Filled 2023-06-20: qty 1000

## 2023-06-20 MED ORDER — POLYETHYLENE GLYCOL 3350 17 G PO PACK: 17.0000 g | PACK | Freq: Every day | ORAL | Status: DC | PRN

## 2023-06-20 MED ORDER — OXYCODONE HCL 5 MG PO TABS
5.0000 mg | ORAL_TABLET | ORAL | Status: DC | PRN
  Administered 2023-06-21 – 2023-06-22 (×5): 10 mg via ORAL
  Filled 2023-06-20 (×6): qty 2

## 2023-06-20 MED ORDER — ARIPIPRAZOLE 2 MG PO TABS
2.0000 mg | ORAL_TABLET | Freq: Every day | ORAL | Status: DC
  Filled 2023-06-20 (×2): qty 1

## 2023-06-20 MED ORDER — ONDANSETRON HCL 4 MG/2ML IJ SOLN
4.0000 mg | Freq: Four times a day (QID) | INTRAMUSCULAR | Status: DC | PRN
  Administered 2023-06-20 – 2023-06-21 (×3): 4 mg via INTRAVENOUS
  Filled 2023-06-20 (×3): qty 2

## 2023-06-20 MED ORDER — ONDANSETRON 4 MG PO TBDP
4.0000 mg | ORAL_TABLET | Freq: Four times a day (QID) | ORAL | Status: DC | PRN
  Administered 2023-06-21 – 2023-06-22 (×3): 4 mg via ORAL
  Filled 2023-06-20 (×3): qty 1

## 2023-06-20 MED ORDER — IOHEXOL 300 MG/ML  SOLN
65.0000 mL | Freq: Once | INTRAMUSCULAR | Status: AC | PRN
  Administered 2023-06-20: 65 mL via INTRAVENOUS

## 2023-06-20 NOTE — Telephone Encounter (Signed)
 Thank you for letting us know

## 2023-06-20 NOTE — ED Provider Triage Note (Signed)
 Emergency Medicine Provider Triage Evaluation Note  Tanya Nguyen , a 59 y.o. female  was evaluated in triage.  Pt complains of feeding tube problem. Patient has both a jejunostomy feeding tube and a separate gastrostomy tube for vent/drainage. The jejunostomy tube is the one causing the problem. Patient does not think it is dislodged but was unable to flush it last night. States her doctor is contacting the hospital so she can have surgery today.  Review of Systems  Positive: Pain at tube site Negative:   Physical Exam  There were no vitals taken for this visit. Gen:   Awake, no distress   Resp:  Normal effort  MSK:   Moves extremities without difficulty  Other:    Medical Decision Making  Medically screening exam initiated at 3:51 PM.  Appropriate orders placed.  Valery Amedee was informed that the remainder of the evaluation will be completed by another provider, this initial triage assessment does not replace that evaluation, and the importance of remaining in the ED until their evaluation is complete.     Phyliss Breen, PA-C 06/20/23 1559

## 2023-06-20 NOTE — Telephone Encounter (Signed)
 Patient scheduled to be seen on 06-20-23 but had to cancel. Her feeding tube has come out and waiting for instruction from the RN at doctor office. She was rescheduled to May in case she can come to that appointment. But wanted Dr. Edda Goo to know the difficulties she is having with this issue.

## 2023-06-20 NOTE — Telephone Encounter (Signed)
 PT called stated that she now has a feeding tube and that she will not be able to take the Tramadol medication. FYI

## 2023-06-20 NOTE — ED Provider Notes (Signed)
 [facility] Provider Note    None    (approximate)   History   Chief Complaint Feeding Tube Issue   HPI  Tanya Nguyen is a 58 y.o. female with past medical history of Roux-en-Y gastrojejunostomy who presents to the ED complaining of feeding tube problem.  Patient reports that she had her jejunostomy feeding tube replaced 3 days ago, initially seem to be functioning well.  Over the past 24 hours, she has had issues with the tube clogging and not being able to receive feeds.  She denies any associated nausea or vomiting, has dealt with abdominal pain since her surgery at the end of last month but denies any worsening pain today.  She has not had any difficulty urinating.     Physical Exam   Triage Vital Signs: ED Triage Vitals [06/20/23 1554]  Encounter Vitals Group     BP 107/80     Systolic BP Percentile      Diastolic BP Percentile      Pulse Rate (!) 115     Resp 16     Temp 98.3 F (36.8 C)     Temp Source Oral     SpO2 98 %     Weight 72 lb 15.6 oz (33.1 kg)     Height 4\' 11"  (1.499 m)     Head Circumference      Peak Flow      Pain Score 5     Pain Loc      Pain Education      Exclude from Growth Chart     Most recent vital signs: Vitals:   06/20/23 1554  BP: 107/80  Pulse: (!) 115  Resp: 16  Temp: 98.3 F (36.8 C)  SpO2: 98%    Constitutional: Alert and oriented. Eyes: Conjunctivae are normal. Head: Atraumatic. Nose: No congestion/rhinnorhea. Mouth/Throat: Mucous membranes are moist.  Cardiovascular: Normal rate, regular rhythm. Grossly normal heart sounds.  2+ radial pulses bilaterally. Respiratory: Normal respiratory effort.  No retractions. Lungs CTAB. Gastrointestinal: Soft and mildly tender to palpation diffusely. No distention.  Gastrostomy and jejunostomy tubes intact with no erythema, edema, or drainage at insertion sites. Musculoskeletal: No lower extremity tenderness nor edema.  Neurologic:  Normal speech and  language. No gross focal neurologic deficits are appreciated.    ED Results / Procedures / Treatments   Labs (all labs ordered are listed, but only abnormal results are displayed) Labs Reviewed  CBC - Abnormal; Notable for the following components:      Result Value   RBC 3.85 (*)    RDW 17.5 (*)    Platelets 421 (*)    All other components within normal limits  COMPREHENSIVE METABOLIC PANEL WITH GFR  LIPASE, BLOOD    PROCEDURES:  Critical Care performed: No  Procedures   MEDICATIONS ORDERED IN ED: Medications  oxyCODONE (Oxy IR/ROXICODONE) immediate release tablet 5-10 mg (has no administration in time range)  HYDROmorphone (DILAUDID) injection 0.5 mg (has no administration in time range)  polyethylene glycol (MIRALAX / GLYCOLAX) packet 17 g (has no administration in time range)  ondansetron (ZOFRAN-ODT) disintegrating tablet 4 mg (has no administration in time range)    Or  ondansetron (ZOFRAN) injection 4 mg (has no administration in time range)  pantoprazole (PROTONIX) injection 40 mg (has no administration in time range)  dextrose 5 % and 0.9 % NaCl with KCl 20 mEq/L infusion (has no administration in time range)  acetaminophen (TYLENOL) tablet 1,000 mg (1,000 mg  Oral Patient Refused/Not Given 06/20/23 1833)  ARIPiprazole (ABILIFY) tablet 2 mg (has no administration in time range)  hydrOXYzine (ATARAX) tablet 25 mg (has no administration in time range)  levothyroxine (SYNTHROID) tablet 50 mcg (has no administration in time range)  mirtazapine (REMERON) tablet 15 mg (has no administration in time range)  oxybutynin (DITROPAN-XL) 24 hr tablet 5 mg (has no administration in time range)     IMPRESSION / MDM / ASSESSMENT AND PLAN / ED COURSE  I reviewed the triage vital signs and the nursing notes.                              58 y.o. female with past medical history of Roux-en-Y gastrojejunostomy who presents to the ED with malfunctioning jejunostomy tube and  inability to do feedings over the past 24 hours.  Patient's presentation is most consistent with acute presentation with potential threat to life or bodily function.  Differential diagnosis includes, but is not limited to, displaced jejunostomy, obstructed jejunostomy, dehydration, electrolyte abnormality, AKI.  Patient nontoxic-appearing and in no acute distress, vital signs are remarkable for tachycardia but otherwise reassuring.  Her abdominal has well-healed surgical sites with jejunostomy and gastrostomy tubes appearing to be intact, no signs of infection noted.  Labs without significant anemia or leukocytosis.  Patient was evaluated by Dr. Mauri Sous of general surgery, who has placed orders for CT imaging and discussed case with interventional radiology.  IR will plan for replacement of the tube tomorrow, patient to be admitted to the general surgery service in the meantime.  Patient currently declines pain or nausea medication.      FINAL CLINICAL IMPRESSION(S) / ED DIAGNOSES   Final diagnoses:  Jejunostomy malfunction (HCC)     Rx / DC Orders   ED Discharge Orders     None        Note:  This document was prepared using Dragon voice recognition software and may include unintentional dictation errors.   Twilla Galea, MD 06/20/23 [phone]

## 2023-06-20 NOTE — H&P (Signed)
 Date of Admission:  06/20/2023  Reason for Admission:  Clogged J tube  History of Present Illness: Tanya Nguyen is a 58 y.o. female presenting for management of a clogged feeding jejunostomy tube.  She is s/p revision of gastrojejunostomy to a RNY type gastrojejunostomy, with a distal feeding jejunostomy.  She also required a venting gastrostomy tube due to issues with persistent nausea.  Unfortunately, over the past 14 days, she has had issues with a clogged J tube which was exchanged on 06/07/23, a dislodged J tube which was replaced on 06/17/23, and now a clogged J tube again.    The patient reports that she's been tolerating a full liquid diet.  She drinks smoothies with protein powder.  She vents her G tube, but does not report much output from it.  She is also tolerating her J tube feeds at night.  Her pain is overall stable and she denies any worsening pain.  Past Medical History: Past Medical History:  Diagnosis Date   Acquired pyloric stricture    Bipolar disorder (HCC)    Bleeding duodenal ulcer    CAP (community acquired pneumonia) 02/12/2020   Chronic esophagogastric ulcer    Chronic low back pain    Chronic pain syndrome    Constipation 04/02/2014   Depression    Duodenal obstruction    Gastric outlet obstruction 01/19/2018   GERD (gastroesophageal reflux disease)    Hair loss 09/22/2018   Head injury 12/24/2022   History of stomach ulcers    Hypothyroidism    IDA (iron deficiency anemia)    Incisional hernia, without obstruction or gangrene    Intractable vomiting    Kidney stones    Melena 05/08/2021   Migraine    Neuropathy    Nodule of middle lobe of right lung 09/17/2021   NSAID long-term use 09/16/2016   Pre-diabetes    Protein-calorie malnutrition, severe (HCC)    PTSD (post-traumatic stress disorder)    Right-sided chest pain 09/17/2021   Sacroiliac joint pain 08/14/2018   Side pain 03/30/2018   Small bowel anastomotic dilation    Thrombocytosis     Upper GI bleed 05/18/2021   Vitamin B12 deficiency    Vitamin D deficiency      Past Surgical History: Past Surgical History:  Procedure Laterality Date   ABDOMINAL HYSTERECTOMY  03/08/2002   partial   bypass gastrojejunostomy     CHOLECYSTECTOMY     COLONOSCOPY WITH PROPOFOL N/A 02/27/2018   Procedure: COLONOSCOPY WITH PROPOFOL;  Surgeon: Midge Minium, MD;  Location: [facility];  Service: Endoscopy;  Laterality: N/A;   ESOPHAGOGASTRODUODENOSCOPY (EGD) WITH PROPOFOL N/A 01/12/2018   Procedure: ESOPHAGOGASTRODUODENOSCOPY (EGD) WITH BIOPSIES;  Surgeon: Midge Minium, MD;  Location: [facility];  Service: Endoscopy;  Laterality: N/A;   ESOPHAGOGASTRODUODENOSCOPY (EGD) WITH PROPOFOL N/A 01/25/2018   Procedure: ESOPHAGOGASTRODUODENOSCOPY (EGD) WITH PROPOFOL;  Surgeon: Wyline Mood, MD;  Location: [facility] ENDOSCOPY;  Service: Gastroenterology;  Laterality: N/A;   ESOPHAGOGASTRODUODENOSCOPY (EGD) WITH PROPOFOL N/A 02/27/2018   Procedure: ESOPHAGOGASTRODUODENOSCOPY (EGD) WITH PROPOFOL;  Surgeon: Midge Minium, MD;  Location: [facility];  Service: Endoscopy;  Laterality: N/A;   ESOPHAGOGASTRODUODENOSCOPY (EGD) WITH PROPOFOL N/A 05/09/2021   Procedure: ESOPHAGOGASTRODUODENOSCOPY (EGD) WITH PROPOFOL;  Surgeon: Jaynie Collins, DO;  Location[facility] ENDOSCOPY;  Service: Gastroenterology;  Laterality: N/A;   ESOPHAGOGASTRODUODENOSCOPY (EGD) WITH PROPOFOL N/A 03/09/2022   Procedure: ESOPHAGOGASTRODUODENOSCOPY (EGD) WITH PROPOFOL;  Surgeon: Midge Minium, MD;  Location: [facility] ENDOSCOPY;  Service: Endoscopy;  Laterality: N/A;   ESOPHAGOGASTRODUODENOSCOPY (EGD)  WITH PROPOFOL N/A 03/30/2023   Procedure: ESOPHAGOGASTRODUODENOSCOPY (EGD) WITH PROPOFOL;  Surgeon: Midge Minium, MD;  Location: [facility] ENDOSCOPY;  Service: Endoscopy;  Laterality: N/A;   GASTROJEJUNOSTOMY N/A 05/12/2023   Procedure: GASTROJEJUNOSTOMY, revision, Lynden Oxford, PA-C to assist, possible feeding jejunostomy;   Surgeon: Henrene Dodge, MD;  Location: [facility] ORS;  Service: General;  Laterality: N/A;   INCISIONAL HERNIA REPAIR N/A 11/23/2018   Procedure: HERNIA REPAIR INCISIONAL;  Surgeon: Henrene Dodge, MD;  Location: [facility] ORS;  Service: General;  Laterality: N/A;   INSERTION OF MESH N/A 11/23/2018   Procedure: INSERTION OF MESH;  Surgeon: Henrene Dodge, MD;  Location: [facility] ORS;  Service: General;  Laterality: N/A;   IR GASTROSTOMY TUBE MOD SED  05/30/2023   IR REPLC DUODEN/JEJUNO TUBE PERCUT W/FLUORO  06/07/2023   IR REPLC GASTRO/COLONIC TUBE PERCUT W/FLUORO  06/17/2023   LAPAROTOMY N/A 01/20/2018   Procedure: EXPLORATORY LAPAROTOMY;  Surgeon: Henrene Dodge, MD;  Location: [facility] ORS;  Service: General;  Laterality: N/A;   LYSIS OF ADHESION N/A 05/12/2023   Procedure: LYSIS OF ADHESION, open, Lynden Oxford, PA-C to assist;  Surgeon: Henrene Dodge, MD;  Location: [facility] ORS;  Service: General;  Laterality: N/A;   TOOTH EXTRACTION      Home Medications: Prior to Admission medications   Medication Sig Start Date End Date Taking? Authorizing Provider  acetaminophen (TYLENOL) 500 MG tablet Take 500-1,000 mg by mouth every 6 (six) hours as needed for moderate pain (pain score 4-6) or headache.    [provider]  ARIPiprazole (ABILIFY) 2 MG tablet Take 1 tablet (2 mg total) by mouth at bedtime. 05/11/23 08/09/23  Jomarie Longs, MD  bisacodyl (DULCOLAX) 5 MG EC tablet Take 5 mg by mouth daily as needed for moderate constipation.    [provider]  diphenhydrAMINE (BENADRYL) 25 MG tablet Take 25 mg by mouth every 6 (six) hours as needed for allergies.    [provider]  hydrOXYzine (ATARAX) 25 MG tablet Take 1 tablet (25 mg total) by mouth daily as needed for anxiety. 05/06/23 08/04/23  Neysa Hotter, MD  levothyroxine (SYNTHROID) 50 MCG tablet Take 1 tablet by mouth every morning on an empty stomach with water only.  No food or other medications for 30 minutes. 05/02/23   Doreene Nest, NP   mirtazapine (REMERON) 15 MG tablet Take 1 tablet (15 mg total) by mouth at bedtime. 05/11/23   Jomarie Longs, MD  Multiple Vitamin (MULTIVITAMIN WITH MINERALS) TABS tablet Take 1 tablet by mouth daily. 04/01/23   Esaw Grandchild A, DO  ondansetron (ZOFRAN-ODT) 4 MG disintegrating tablet Take 1 tablet (4 mg total) by mouth every 6 (six) hours as needed for nausea. 05/27/23   Donovan Kail, PA-C  oxybutynin (DITROPAN-XL) 5 MG 24 hr tablet TAKE 1 TABLET (5 MG TOTAL) BY MOUTH AT BEDTIME. FOR BLADDER INCONTINENCE. 01/21/23   Doreene Nest, NP  oxyCODONE (ROXICODONE) 5 MG/5ML solution Place 5 mLs (5 mg total) into feeding tube every 6 (six) hours as needed for severe pain (pain score 7-10). 06/16/23   Henrene Dodge, MD  pantoprazole (PROTONIX) 40 MG tablet Take 1 tablet (40 mg total) by mouth daily. 05/04/23 04/28/24  Celso Amy, PA-C  SUMAtriptan (IMITREX) 50 MG tablet Take 1 tablet at migraine onset. May repeat in 2 hours if headache persists or recurs. 03/08/23   Doreene Nest, NP  traMADol (ULTRAM) 50 MG tablet Take 1 tablet (50 mg total) by mouth every 12 (twelve) hours as needed. 02/01/23  Cephus Collin, MD    Allergies: Allergies  Allergen Reactions   Penicillins Shortness Of Breath, Diarrhea and Nausea And Vomiting    TOLERATED CEPHALOSPORINS   Nsaids Other (See Comments)    Pt states NSAIDS cause "internal bleeding"   Gabapentin Other (See Comments)    Hair Loss    Pregabalin     Tremors   Sulfa Antibiotics Nausea And Vomiting    Social History:  reports that she has never smoked. She has never been exposed to tobacco smoke. She has never used smokeless tobacco. She reports that she does not currently use alcohol. She reports that she does not use drugs.   Family History: Family History  Problem Relation Age of Onset   Cancer Mother    Breast cancer Mother 52   Lung cancer Father    Drug abuse Sister    Alcohol abuse Sister    Bipolar disorder Sister    Alcohol  abuse Maternal Grandmother    Stroke Maternal Grandmother    Stroke Paternal Grandmother     Review of Systems: Review of Systems  Constitutional:  Negative for chills and fever.  Respiratory:  Negative for shortness of breath.   Cardiovascular:  Negative for chest pain.  Gastrointestinal:  Positive for abdominal pain. Negative for nausea and vomiting.  Genitourinary:  Negative for dysuria.    Physical Exam BP 116/74   Pulse (!) 108   Temp 98.1 F (36.7 C) (Oral)   Resp 18   Ht 4\' 11"  (1.499 m)   Wt 33.1 kg   SpO2 98%   BMI 14.74 kg/m  CONSTITUTIONAL: No acute distress, very thin HEENT:  Normocephalic, atraumatic, extraocular motion intact. NECK: Trachea is midline, and there is no jugular venous distension.  RESPIRATORY:  Normal respiratory effort without pathologic use of accessory muscles. CARDIOVASCULAR: Regular rhythm, sinus tachycardia. GI: The abdomen is soft, non-distended, without significant tenderness.  Midline incision is well healed.  G tube in place connected to gravity drainage, with some looseness of the silicone bumper which was readjusted to better position. J tube currently capped, appears to be in good position without any evidence of dislodgement or retraction, with holding suture in place. MUSCULOSKELETAL:  Normal muscle strength and tone in all four extremities.  No peripheral edema or cyanosis. SKIN: Skin turgor is normal. There are no pathologic skin lesions.  NEUROLOGIC:  Motor and sensation is grossly normal.  Cranial nerves are grossly intact. PSYCH:  Alert and oriented to person, place and time. Affect is normal.  Laboratory Analysis: Labs on 06/20/23: Na 131, K 3.5, Cl 96, CO2 26, BUN 12, Cr 0.46.  Tbili 0.6, AST 30, ALT 32, Alk Phos 99, Albumin 2, lipase 55.  WBC 9, Hgb 12.3, Hct 36.2, Plt 421.  Imaging: CT ABDOMEN PELVIS W CONTRAST Result Date: 06/20/2023 CLINICAL DATA:  Postoperative abdominal pain, poorly function jejunostomy catheter EXAM: CT  ABDOMEN AND PELVIS WITH CONTRAST TECHNIQUE: Multidetector CT imaging of the abdomen and pelvis was performed using the standard protocol following bolus administration of intravenous contrast. RADIATION DOSE REDUCTION: This exam was performed according to the departmental dose-optimization program which includes automated exposure control, adjustment of the mA and/or kV according to patient size and/or use of iterative reconstruction technique. CONTRAST:  65mL OMNIPAQUE IOHEXOL 300 MG/ML  SOLN COMPARISON:  05/28/2023 FINDINGS: Lower chest: No acute abnormality. Hepatobiliary: No focal liver abnormality is seen. Status post cholecystectomy. No biliary dilatation. Pancreas: Unremarkable. No pancreatic ductal dilatation or surrounding inflammatory changes. Spleen: Normal in  size without focal abnormality. Adrenals/Urinary Tract: Adrenal glands are within normal limits. Kidneys demonstrate bilateral small less than 3 mm nonobstructing stones. Collecting systems are within normal limits. No obstructing lesions are noted. The bladder is partially distended. Stomach/Bowel: Colon is mildly distended with air although no obstructive changes are seen. Appendix is not well visualized. No inflammatory changes to suggest appendicitis are noted. Gastrostomy is noted within the stomach. Postsurgical changes within the stomach are noted as well. Jejunostomy catheter extends into the jejunum. No complicating factors are seen. No kinking is noted. Small bowel again demonstrates changes consistent with prior Roux-en-Y gastrojejunostomy. The degree of dilatation has improved when compared with the prior study. Vascular/Lymphatic: Aortic atherosclerosis. No enlarged abdominal or pelvic lymph nodes. Reproductive: Uterus and bilateral adnexa are unremarkable. Other: No abdominal wall hernia or abnormality. No abdominopelvic ascites. Musculoskeletal: No acute or significant osseous findings. IMPRESSION: Postsurgical changes are noted.  No  complicating factors are noted. Gastrostomy catheter in jejunostomy catheter are noted in satisfactory position. No kinking is noted. Nonobstructing renal calculi bilaterally. Electronically Signed   By: Violeta Grey M.D.   On: 06/20/2023 21:56    Assessment and Plan: This is a 58 y.o. female with a clogged feeding jejunostomy tube.  --Discussed with IR today about replacement of her J tube.  Unclear why it is getting clogged so easily lately.  Unfortunately they are unable to do any procedure today.  As such, patient will be admitted today to surgical team and we'll discuss with IR again tomorrow about tube replacement.   --For now, will allow full liquid diet tonight and make her NPO after midnight.  Start IV fluids to keep her hydrated.   --CT scan of her abdomen/pelvis was also obtained and this showed her J tube to be in good place.  The main difference from before is that the proximal small bowel loops are no longer distended like they had been before.  May be as a result of her venting, though she reports that not much comes out and she has not been feeling too bloated or nauseous like before.  For now will continue with G tube venting as needed.  Will cap for now to assess her symptoms.  I spent 75 minutes dedicated to the care of this patient on the date of this encounter to include pre-visit review of records, face-to-face time with the patient discussing diagnosis and management, and any post-visit coordination of care.   [name], MD [facility] Pg:  [phone]

## 2023-06-20 NOTE — ED Triage Notes (Signed)
 Pt here with a feeding tube issue. Pt states she has not been able to use her feeding tube since last night. Pt states she is unsure if it came out or if it is clogged. Pt endorses mild abd pain.

## 2023-06-21 ENCOUNTER — Observation Stay: Admitting: Radiology

## 2023-06-21 DIAGNOSIS — K9419 Other complications of enterostomy: Secondary | ICD-10-CM | POA: Diagnosis not present

## 2023-06-21 HISTORY — PX: IR REPLC DUODEN/JEJUNO TUBE PERCUT W/FLUORO: IMG2334

## 2023-06-21 MED ORDER — MIDAZOLAM HCL 2 MG/2ML IJ SOLN
INTRAMUSCULAR | Status: AC
  Filled 2023-06-21: qty 2

## 2023-06-21 MED ORDER — FENTANYL CITRATE (PF) 100 MCG/2ML IJ SOLN
INTRAMUSCULAR | Status: AC
  Filled 2023-06-21: qty 2

## 2023-06-21 MED ORDER — OSMOLITE 1.2 CAL PO LIQD
1000.0000 mL | ORAL | Status: DC
  Administered 2023-06-21: 1000 mL

## 2023-06-21 MED ORDER — SODIUM CHLORIDE 0.9 % IV SOLN
INTRAVENOUS | Status: DC
Start: 2023-06-21 — End: 2023-06-22

## 2023-06-21 MED ORDER — FREE WATER
30.0000 mL | Status: DC
  Administered 2023-06-21 – 2023-06-22 (×6): 30 mL

## 2023-06-21 MED ORDER — MIDAZOLAM HCL 5 MG/5ML IJ SOLN
INTRAMUSCULAR | Status: DC | PRN
  Administered 2023-06-21: 1 mg via INTRAVENOUS

## 2023-06-21 MED ORDER — IOHEXOL 300 MG/ML  SOLN
10.0000 mL | Freq: Once | INTRAMUSCULAR | Status: AC | PRN
  Administered 2023-06-21: 10 mL

## 2023-06-21 MED ORDER — LIDOCAINE HCL 1 % IJ SOLN
INTRAMUSCULAR | Status: AC
  Filled 2023-06-21: qty 20

## 2023-06-21 MED ORDER — FENTANYL CITRATE (PF) 100 MCG/2ML IJ SOLN
INTRAMUSCULAR | Status: DC | PRN
  Administered 2023-06-21 (×2): 50 ug via INTRAVENOUS

## 2023-06-21 NOTE — Procedures (Signed)
 Interventional Radiology Procedure Note  Procedure: Jejunostomy tube exchange  Complications: None  Estimated Blood Loss: None  Findings: 14 Fr red rubber jejunostomy exchanged over wire for 16 Fr balloon retention jejunostomy tube advanced well into small bowel. Retention balloon inflated with 5 mL of saline.  OK to use new tube immediately.  Nonda Bays. Nereida Banning, M.D Pager:  [phone]

## 2023-06-21 NOTE — Progress Notes (Signed)
 [facility] SURGICAL PROGRESS NOTE  Hospital Day(s): 0.   Interval History:  Patient seen and examined No acute events or new complaints overnight.  Patient reports she feels good; reports had a good night No abdominal pain, nausea, emesis, fever, chills No new labs this morning G-tube to gravity overnight; she is unsure why she unclamped it Tube feeds held Plan for IR evaluation for unclogging vs replacement of jejunostomy   Vital signs in last 24 hours: [min-max] current  Temp:  [97.9 F (36.6 C)-98.3 F (36.8 C)] 97.9 F (36.6 C) (04/15 0747) Pulse Rate:  [84-115] 84 (04/15 0747) Resp:  [16-20] 16 (04/15 0747) BP: (107-118)/(70-93) 118/70 (04/15 0747) SpO2:  [98 %-100 %] 100 % (04/15 0747) Weight:  [33.1 kg] 33.1 kg (04/14 1554)     Height: 4\' 11"  (149.9 cm) Weight: 33.1 kg BMI (Calculated): 14.73   Intake/Output last 2 shifts:  04/14 0701 - 04/15 0700 In: 50 [P.O.:50] Out: 150 [Urine:150]   Physical Exam:  Constitutional: alert, cooperative and no distress  Respiratory: breathing non-labored at rest  Cardiovascular: regular rate and sinus rhythm  Gastrointestinal: soft, non-tender, and non-distended. G-tube in LUQ; to gravity; output with gastric contents. Jejunostomy in left abdomen; clamped Integumentary: Laparotomy is well healed   Labs:     Latest Ref Rng & Units 06/20/2023    6:33 PM 05/29/2023    4:57 AM 05/28/2023   10:05 AM  CBC  WBC 4.0 - 10.5 K/uL 9.0  9.7  13.5   Hemoglobin 12.0 - 15.0 g/dL 16.1  09.6  04.5   Hematocrit 36.0 - 46.0 % 36.2  29.3  34.3   Platelets 150 - 400 K/uL 421  640  864       Latest Ref Rng & Units 06/20/2023    6:33 PM 05/31/2023    4:38 AM 05/30/2023    5:06 AM  CMP  Glucose 70 - 99 mg/dL 98  409  811   BUN 6 - 20 mg/dL 12  13  11    Creatinine 0.44 - 1.00 mg/dL 9.14  7.82  9.56   Sodium 135 - 145 mmol/L 131  140  140   Potassium 3.5 - 5.1 mmol/L 3.5  3.9  4.1   Chloride 98 - 111 mmol/L 96  102  103   CO2 22  - 32 mmol/L 26  28  29    Calcium 8.9 - 10.3 mg/dL 7.8  8.0  7.7   Total Protein 6.5 - 8.1 g/dL 4.8  4.6  4.5   Total Bilirubin 0.0 - 1.2 mg/dL 0.6  0.5  0.3   Alkaline Phos 38 - 126 U/L 99  86  87   AST 15 - 41 U/L 30  21  19    ALT 0 - 44 U/L 32  16  17      Imaging studies: No new pertinent imaging studies   Assessment/Plan:  58 y.o. female with jejunostomy tube dysfunction s/p revision of prior loop gastrojejunostomy with omega loop into a RNY type gastrojejunostomy and insertion of feeding jejunostomy on 05/12/23, complicated by pertinent comorbidities including malnutrition.   - Unfortunately, she continues to have recurrent issue with her jejunostomy tube whether this is accidental removal or getting clogged, no needing potential third replacement in two weeks. Greatly appreciate IR efforts with this. May consider up-sizing to 16 Fr is needed vs may need to cut bigger side ports in red rubber to allow better drainage.     - NPO for  pending IR eval - Hold TF for now; okay to continue nocturnal feeds   - Gastrostomy tube clamped; Okay to return to passive drainage if develops nausea/distension - Monitor abdominal examination   - Pain control prn; antiemetics prn - Mobilize  - Discharge Planning: Will discuss with CSW, question whether or not she may benefit from SNF? Understand there may not be indication for this. If not, will check on if we can increase her home health needs to hopefully prevent these recurrent issue with her J-tube  All of the above findings and recommendations were discussed with the patient, and the medical team, and all of patient's questions were answered to her expressed satisfaction.  -- Apolonio Bay, PA-C [facility] 06/21/2023, 7:51 AM M-F: 7am - 4pm

## 2023-06-21 NOTE — Progress Notes (Signed)
 PT Cancellation Note  Patient Details Name: Tanya Nguyen MRN: [id number] DOB: 25-Jan-1966   Cancelled Treatment:    Reason Eval/Treat Not Completed: Other (comment). Consult received and chart reviewed. Pt currently out of room for procedure for repair of feeding tube. Will re-attempt another date.   Florentina Marquart 06/21/2023, 1:59 PM Amparo Balk, PT, DPT, G[facility] [phone]

## 2023-06-21 NOTE — Consult Note (Signed)
 Chief Complaint: Jejunostomy no longer patent; Request for image guided jejunostomy tube exchange  Referring Provider(s): Yves Dill, [place]  Supervising Physician: Irish Lack  Patient Status: [facility] - In-pt  History of Present Illness: Tanya Nguyen is a 58 y.o. female with a past medical history significant for GERD, migraine, upper GI bleed, bipolar disorder. In March 2025, patient began to experience trouble swallowing and emesis. She underwent exploratory Laparotomy, lysis of adhesions, resection of previous gastrojejunostomy and jejunojejunostomy, creation of new Roux-en-Y gastrojejunostomy, new feeding jejunostomy, and was discharged 3/21.  She continued to experience nausea and abd distention, leading to additional venting G tube which she states has improved symptoms.   She is known to IR from previous J-tube exchanges and venting G-tube placement.  She presented to the ER on 06/20/2023 with report of her jejunostomy feeding tube not working correctly, last replaced 06/07/2023 by Dr. Fredia Sorrow with 14 French red rubber catheter and again on 06/17/2023 when the J-tube became displaced.    Dr. Aleen Campi saw the patient while inpatient, CT scan suggests a J-tube is well-positioned currently, unclear why tube is clogging so easily. IR is consulted for tube replacement and possible upsize to a 76 [place].  Patient amenable to this plan, states that she is feeling at her baseline today.  Denies fever, chills, bleeding.  See below for full ROS.  Tube feedings and oral intake have been held since midnight.  Not on a blood thinner.   Patient is Full Code  Past Medical History:  Diagnosis Date   Acquired pyloric stricture    Bipolar disorder (HCC)    Bleeding duodenal ulcer    CAP (community acquired pneumonia) 02/12/2020   Chronic esophagogastric ulcer    Chronic low back pain    Chronic pain syndrome    Constipation 04/02/2014   Depression    Duodenal obstruction    Gastric outlet  obstruction 01/19/2018   GERD (gastroesophageal reflux disease)    Hair loss 09/22/2018   Head injury 12/24/2022   History of stomach ulcers    Hypothyroidism    IDA (iron deficiency anemia)    Incisional hernia, without obstruction or gangrene    Intractable vomiting    Kidney stones    Melena 05/08/2021   Migraine    Neuropathy    Nodule of middle lobe of right lung 09/17/2021   NSAID long-term use 09/16/2016   Pre-diabetes    Protein-calorie malnutrition, severe (HCC)    PTSD (post-traumatic stress disorder)    Right-sided chest pain 09/17/2021   Sacroiliac joint pain 08/14/2018   Side pain 03/30/2018   Small bowel anastomotic dilation    Thrombocytosis    Upper GI bleed 05/18/2021   Vitamin B12 deficiency    Vitamin D deficiency     Past Surgical History:  Procedure Laterality Date   ABDOMINAL HYSTERECTOMY  03/08/2002   partial   bypass gastrojejunostomy     CHOLECYSTECTOMY     COLONOSCOPY WITH PROPOFOL N/A 02/27/2018   Procedure: COLONOSCOPY WITH PROPOFOL;  Surgeon: Midge Minium, MD;  Location: [facility];  Service: Endoscopy;  Laterality: N/A;   ESOPHAGOGASTRODUODENOSCOPY (EGD) WITH PROPOFOL N/A 01/12/2018   Procedure: ESOPHAGOGASTRODUODENOSCOPY (EGD) WITH BIOPSIES;  Surgeon: Midge Minium, MD;  Location: [facility];  Service: Endoscopy;  Laterality: N/A;   ESOPHAGOGASTRODUODENOSCOPY (EGD) WITH PROPOFOL N/A 01/25/2018   Procedure: ESOPHAGOGASTRODUODENOSCOPY (EGD) WITH PROPOFOL;  Surgeon: Wyline Mood, MD;  Location: [facility];  Service: Gastroenterology;  Laterality: N/A;   ESOPHAGOGASTRODUODENOSCOPY (EGD) WITH PROPOFOL N/A  02/27/2018   Procedure: ESOPHAGOGASTRODUODENOSCOPY (EGD) WITH PROPOFOL;  Surgeon: Midge Minium, MD;  Location: [facility];  Service: Endoscopy;  Laterality: N/A;   ESOPHAGOGASTRODUODENOSCOPY (EGD) WITH PROPOFOL N/A 05/09/2021   Procedure: ESOPHAGOGASTRODUODENOSCOPY (EGD) WITH PROPOFOL;  Surgeon: Jaynie Collins,  DO;  Location: [facility] ENDOSCOPY;  Service: Gastroenterology;  Laterality: N/A;   ESOPHAGOGASTRODUODENOSCOPY (EGD) WITH PROPOFOL N/A 03/09/2022   Procedure: ESOPHAGOGASTRODUODENOSCOPY (EGD) WITH PROPOFOL;  Surgeon: Midge Minium, MD;  Location: [facility] ENDOSCOPY;  Service: Endoscopy;  Laterality: N/A;   ESOPHAGOGASTRODUODENOSCOPY (EGD) WITH PROPOFOL N/A 03/30/2023   Procedure: ESOPHAGOGASTRODUODENOSCOPY (EGD) WITH PROPOFOL;  Surgeon: Midge Minium, MD;  Location: [facility] ENDOSCOPY;  Service: Endoscopy;  Laterality: N/A;   GASTROJEJUNOSTOMY N/A 05/12/2023   Procedure: GASTROJEJUNOSTOMY, revision, Lynden Oxford, PA-C to assist, possible feeding jejunostomy;  Surgeon: Henrene Dodge, MD;  Location: [facility] ORS;  Service: General;  Laterality: N/A;   INCISIONAL HERNIA REPAIR N/A 11/23/2018   Procedure: HERNIA REPAIR INCISIONAL;  Surgeon: Henrene Dodge, MD;  Location: [facility];  Service: General;  Laterality: N/A;   INSERTION OF MESH N/A 11/23/2018   Procedure: INSERTION OF MESH;  Surgeon: Henrene Dodge, MD;  Location: [facility] OR[facility];  Service: General;  Laterality: N/A;   IR GASTROSTOMY TUBE MOD SED  05/30/2023   IR REPLC DUODEN/JEJUNO TUBE PERCUT W/FLUORO  06/07/2023   IR REPLC GASTRO/COLONIC TUBE PERCUT W/FLUORO  06/17/2023   LAPAROTOMY N/A 01/20/2018   Procedure: EXPLORATORY LAPAROTOMY;  Surgeon: Henrene Dodge, MD;  Location: [facility];  Service: General;  Laterality: N/A;   LYSIS OF ADHESION N/A 05/12/2023   Procedure: LYSIS OF ADHESION, open, Lynden Oxford, PA-C to assist;  Surgeon: Henrene Dodge, MD;  Location: [facility];  Service: General;  Laterality: N/A;   TOOTH EXTRACTION      Allergies: Penicillins, Nsaids, Gabapentin, Pregabalin, and Sulfa antibiotics  Medications: Prior to Admission medications   Medication Sig Start Date End Date Taking? Authorizing Provider  acetaminophen (TYLENOL) 500 MG tablet Take 500-1,000 mg by mouth every 6 (six) hours as needed for moderate pain (pain score 4-6) or headache.   Yes  [provider]  ARIPiprazole (ABILIFY) 2 MG tablet Take 1 tablet (2 mg total) by mouth at bedtime. 05/11/23 08/09/23 Yes Jomarie Longs, MD  bisacodyl (DULCOLAX) 5 MG EC tablet Take 5 mg by mouth daily as needed for moderate constipation.   Yes [provider]  diphenhydrAMINE (BENADRYL) 25 MG tablet Take 25 mg by mouth every 6 (six) hours as needed for allergies.   Yes [provider]  levothyroxine (SYNTHROID) 50 MCG tablet Take 1 tablet by mouth every morning on an empty stomach with water only.  No food or other medications for 30 minutes. 05/02/23  Yes Doreene Nest, NP  Multiple Vitamin (MULTIVITAMIN WITH MINERALS) TABS tablet Take 1 tablet by mouth daily. 04/01/23  Yes Esaw Grandchild A, DO  ondansetron (ZOFRAN-ODT) 4 MG disintegrating tablet Take 1 tablet (4 mg total) by mouth every 6 (six) hours as needed for nausea. 05/27/23  Yes Lynden Oxford R, PA-C  oxybutynin (DITROPAN-XL) 5 MG 24 hr tablet TAKE 1 TABLET (5 MG TOTAL) BY MOUTH AT BEDTIME. FOR BLADDER INCONTINENCE. 01/21/23  Yes Doreene Nest, NP  oxyCODONE (ROXICODONE) 5 MG/5ML solution Place 5 mLs (5 mg total) into feeding tube every 6 (six) hours as needed for severe pain (pain score 7-10). 06/16/23  Yes Piscoya, Elita Quick, MD  pantoprazole (PROTONIX) 40 MG tablet Take 1 tablet (40 mg total) by mouth daily. 05/04/23 04/28/24 Yes Celso Amy, PA-C  SUMAtriptan (IMITREX) 50 MG tablet Take 1 tablet at migraine onset. May repeat in 2 hours if headache persists or recurs. 03/08/23  Yes Clark, Katherine K, NP  hydrOXYzine (ATARAX) 25 MG tablet Take 1 tablet (25 mg total) by mouth daily as needed for anxiety. Patient not taking: Reported on 06/20/2023 05/06/23 08/04/23  Todd Fossa, MD  mirtazapine (REMERON) 15 MG tablet Take 1 tablet (15 mg total) by mouth at bedtime. Patient not taking: Reported on 06/20/2023 05/11/23   Eappen, Saramma, MD  traMADol (ULTRAM) 50 MG tablet Take 1 tablet (50 mg total) by mouth every 12  (twelve) hours as needed. Patient not taking: Reported on 06/20/2023 02/01/23   Cephus Collin, MD     Family History  Problem Relation Age of Onset   Cancer Mother    Breast cancer Mother 73   Lung cancer Father    Drug abuse Sister    Alcohol abuse Sister    Bipolar disorder Sister    Alcohol abuse Maternal Grandmother    Stroke Maternal Grandmother    Stroke Paternal Grandmother     Social History   Socioeconomic History   Marital status: Married    Spouse name: Not on file   Number of children: 0   Years of education: 12   Highest education level: High school graduate  Occupational History   Occupation: Homemaker  Tobacco Use   Smoking status: Never    Passive exposure: Never   Smokeless tobacco: Never  Vaping Use   Vaping status: Never Used  Substance and Sexual Activity   Alcohol use: Not Currently    Alcohol/week: 0.0 standard drinks of alcohol   Drug use: No   Sexual activity: Not Currently    Birth control/protection: None  Other Topics Concern   Not on file  Social History Narrative   Lives at home with her husband.   Right-handed.   Rare caffeine.   Social Drivers of Corporate investment banker Strain: Not on file  Food Insecurity: No Food Insecurity (06/21/2023)   Hunger Vital Sign    Worried About Running Out of Food in the Last Year: Never true    Ran Out of Food in the Last Year: Never true  Transportation Needs: No Transportation Needs (06/21/2023)   PRAPARE - Administrator, Civil Service (Medical): No    Lack of Transportation (Non-Medical): No  Physical Activity: Not on file  Stress: Not on file  Social Connections: Unknown (06/21/2023)   Social Connection and Isolation Panel [NHANES]    Frequency of Communication with Friends and Family: More than three times a week    Frequency of Social Gatherings with Friends and Family: Twice a week    Attends Religious Services: Patient declined    Database administrator or Organizations:  Yes    Attends Banker Meetings: Patient declined    Marital Status: Patient declined     Review of Systems: A 12 point ROS discussed and pertinent positives are indicated in the HPI above.  All other systems are negative.  Review of Systems  Constitutional:  Negative for chills and fever.  Respiratory:  Negative for shortness of breath.   Cardiovascular:  Negative for chest pain.  Gastrointestinal:  Positive for abdominal pain. Negative for diarrhea, nausea and vomiting.       Chronic abd pain  Skin:  Negative for rash and wound.  Hematological:  Does not bruise/bleed easily.    Vital Signs: BP 118/70 (BP  Location: Right Arm)   Pulse 84   Temp 97.9 F (36.6 C) (Oral)   Resp 16   Ht 4\' 11"  (1.499 m)   Wt 72 lb 15.6 oz (33.1 kg)   SpO2 100%   BMI 14.74 kg/m     Physical Exam HENT:     Mouth/Throat:     Mouth: Mucous membranes are dry.     Pharynx: Oropharynx is clear.  Cardiovascular:     Rate and Rhythm: Normal rate and regular rhythm.     Pulses: Normal pulses.     Heart sounds: Normal heart sounds.  Pulmonary:     Effort: Pulmonary effort is normal.     Breath sounds: Normal breath sounds.  Abdominal:     Palpations: Abdomen is soft.     Tenderness: There is no abdominal tenderness.     Comments: G-tube and J-tube present, dressings clean dry and intact, minimal crusting at insertion site of both tubes.  Skin:    General: Skin is warm and dry.  Neurological:     Mental Status: She is alert and oriented to person, place, and time.     Imaging: CT ABDOMEN PELVIS W CONTRAST Result Date: 06/20/2023 CLINICAL DATA:  Postoperative abdominal pain, poorly function jejunostomy catheter EXAM: CT ABDOMEN AND PELVIS WITH CONTRAST TECHNIQUE: Multidetector CT imaging of the abdomen and pelvis was performed using the standard protocol following bolus administration of intravenous contrast. RADIATION DOSE REDUCTION: This exam was performed according to the  departmental dose-optimization program which includes automated exposure control, adjustment of the mA and/or kV according to patient size and/or use of iterative reconstruction technique. CONTRAST:  65mL OMNIPAQUE IOHEXOL 300 MG/ML  SOLN COMPARISON:  05/28/2023 FINDINGS: Lower chest: No acute abnormality. Hepatobiliary: No focal liver abnormality is seen. Status post cholecystectomy. No biliary dilatation. Pancreas: Unremarkable. No pancreatic ductal dilatation or surrounding inflammatory changes. Spleen: Normal in size without focal abnormality. Adrenals/Urinary Tract: Adrenal glands are within normal limits. Kidneys demonstrate bilateral small less than 3 mm nonobstructing stones. Collecting systems are within normal limits. No obstructing lesions are noted. The bladder is partially distended. Stomach/Bowel: Colon is mildly distended with air although no obstructive changes are seen. Appendix is not well visualized. No inflammatory changes to suggest appendicitis are noted. Gastrostomy is noted within the stomach. Postsurgical changes within the stomach are noted as well. Jejunostomy catheter extends into the jejunum. No complicating factors are seen. No kinking is noted. Small bowel again demonstrates changes consistent with prior Roux-en-Y gastrojejunostomy. The degree of dilatation has improved when compared with the prior study. Vascular/Lymphatic: Aortic atherosclerosis. No enlarged abdominal or pelvic lymph nodes. Reproductive: Uterus and bilateral adnexa are unremarkable. Other: No abdominal wall hernia or abnormality. No abdominopelvic ascites. Musculoskeletal: No acute or significant osseous findings. IMPRESSION: Postsurgical changes are noted.  No complicating factors are noted. Gastrostomy catheter in jejunostomy catheter are noted in satisfactory position. No kinking is noted. Nonobstructing renal calculi bilaterally. Electronically Signed   By: Alcide Clever M.D.   On: 06/20/2023 21:56   IR REPLACE  GASTRO/JEJUNO TUBE PERC W/FLUORO Result Date: 06/20/2023 INDICATION: 58 year old female with a history of Roux-en-Y gastrojejunostomy and direct feeding jejunostomy tube placed in March of 2025. Her 51 French surgically placed jejunostomy tube became clogged was initially replaced by interventional radiology on June 07, 2023. Patient now presents to the emergency room 10 days later with inadvertent displacement of the tube. EXAM: Surgical jejunostomy tube rescue MEDICATIONS: None. ANESTHESIA/SEDATION: None. CONTRAST:  10 mL Isovue-300-administered into the gastric  lumen. FLUOROSCOPY: Radiation Exposure Index (as provided by the fluoroscopic device): 8.1 mGy Kerma COMPLICATIONS: None immediate. PROCEDURE: Informed written consent was obtained from the patient after a thorough discussion of the procedural risks, benefits and alternatives. All questions were addressed. Maximal Sterile Barrier Technique was utilized including caps, mask, sterile gowns, sterile gloves, sterile drape, hand hygiene and skin antiseptic. A timeout was performed prior to the initiation of the procedure. An angled 5 French catheter was carefully navigated through the tract and into the duodenum. Utilizing a Glidewire, the catheter was navigated more distally into the jejunum. The percutaneous tract was then dilated to 16 [place]. An attempt was made to pass a 16 French Councill tip Foley catheter over the wire and into the duodenum to utilize the balloon retention system. However, this was unsuccessful due to a lack of stiffness of the Foley catheter which resulted in infolding of the distal tip. Therefore, a 14 French red rubber catheter was reinforced with a 5 French sheath and advanced over the wire and into the jejunum. The catheter was carefully secured to the skin with 0 Prolene suture. Contrast injection confirms that the catheter tip is in the jejunum. The catheter was then flushed with saline. IMPRESSION: Successful replacement of a  14 French red rubber catheter serving as a direct jejunostomy feeding tube. Patient will be scheduled to return to interventional Radiology in the coming weeks for and attempted upgrade to a balloon retention jejunostomy tube or Foley catheter in an effort to reduce the risk of future displacement. Electronically Signed   By: Malachy Moan M.D.   On: 06/20/2023 08:50   IR Replc Duoden/Jejuno Tube Percut W/Fluoro Result Date: 06/07/2023 INDICATION: Occluded 14 French surgically placed jejunostomy tube. EXAM: REPLACEMENT OF JEJUNOSTOMY FEEDING TUBE UNDER FLUOROSCOPY MEDICATIONS: None ANESTHESIA/SEDATION: None CONTRAST:  15 mL Omnipaque 300-administered into the gastric lumen. FLUOROSCOPY TIME:  Fluoroscopy Time: 4 minutes and 12 seconds. 4.0 mGy. COMPLICATIONS: None immediate. PROCEDURE: Informed written consent was obtained from the patient after a thorough discussion of the procedural risks, benefits and alternatives. All questions were addressed. Maximal Sterile Barrier Technique was utilized including caps, mask, sterile gowns, sterile gloves, sterile drape, hand hygiene and skin antiseptic. A timeout was performed prior to the initiation of the procedure. Initial attempts were made to unclog an occluded 14 French red rubber jejunostomy utilizing flushing maneuvers, advancement intraluminal brush device and advancement of guidewires. Ultimately, the occluded red rubber catheter was removed and a 5 [place] Kumpe catheter advanced through the jejunostomy tract and back into small bowel lumen. The catheter was further advanced over a hydrophilic guidewire into small bowel. A new 14 French red rubber catheter was then advanced over the wire under fluoroscopy. Catheter position was confirmed by fluoroscopy after injection of contrast. The catheter was secured at the skin with a silk retention suture and a feeding adapter applied at the catheter exit site. FINDINGS: Maneuvers were unsuccessful in unclogging the  occluded jejunostomy catheter. Fluoroscopy demonstrates some relative kinking of the distal segment of the catheter in the small bowel. The occluded catheter was successfully exchanged over a guidewire for a new 14 French red rubber catheter which was advanced well into the small bowel. This catheter is widely patent and may be used immediately. IMPRESSION: 1. Inability to unclog occluded jejunostomy catheter. 2. Replacement of occluded jejunostomy catheter for new 14 French red rubber catheter advanced into the small bowel under fluoroscopy. This catheter is ready for immediate use. Electronically Signed   By: Sherrine Maples  Fredia Sorrow M.D.   On: 06/07/2023 16:31   IR GASTROSTOMY TUBE MOD SED Result Date: 05/30/2023 INDICATION: 58 year old female referred for gastrostomy, for purposes of venting EXAM: PERC PLACEMENT GASTROSTOMY MEDICATIONS: Vancomycin 1 gm IV; Antibiotics were administered within 1 hour of the procedure. ANESTHESIA/SEDATION: Versed 1.0 mg IV; Fentanyl 75 mcg IV Moderate Sedation Time:  11 minutes The patient was continuously monitored during the procedure by the interventional radiology nurse under my direct supervision. CONTRAST:  8mL OMNIPAQUE IOHEXOL 300 MG/ML SOLN - administered into the gastric lumen. FLUOROSCOPY: Radiation Exposure Index (as provided by the fluoroscopic device): 2 mGy Kerma COMPLICATIONS: None PROCEDURE: Informed written consent was obtained from the patient and the patient's family after a thorough discussion of the procedural risks, benefits and alternatives. All questions were addressed. Maximal Sterile Barrier Technique was utilized including caps, mask, sterile gowns, sterile gloves, sterile drape, hand hygiene and skin antiseptic. A timeout was performed prior to the initiation of the procedure. The epigastrium was prepped with Betadine in a sterile fashion, and a sterile drape was applied covering the operative field. A sterile gown and sterile gloves were used for the  procedure. A 5-French orogastric tube is placed under fluoroscopic guidance. Scout imaging of the abdomen confirms barium within the transverse colon. The stomach was distended with gas. Under fluoroscopic guidance, an 18 gauge needle was utilized to puncture the anterior wall of the body of the stomach. An Amplatz wire was advanced through the needle passing a T fastener into the lumen of the stomach. The T fastener was secured for gastropexy. A 9-French sheath was inserted. A snare was advanced through the 9-French sheath. A Teena Dunk was advanced through the orogastric tube. It was snared then pulled out the oral cavity, pulling the snare, as well. The leading edge of the gastrostomy was attached to the snare. It was then pulled down the esophagus and out the percutaneous site. Tube secured in place. Contrast was injected. Patient tolerated the procedure well and remained hemodynamically stable throughout. No complications were encountered and no significant blood loss encountered. IMPRESSION: Status post fluoroscopic placed percutaneous gastrostomy tube, with 20 [place] pull-through. Signed, Yvone Neu. Miachel Roux, RPVI [facility] Electronically Signed   By: Gilmer Mor D.O.   On: 05/30/2023 16:00   DG Abd 2 Views Result Date: 05/29/2023 CLINICAL DATA:  Follow-up small-bowel obstruction EXAM: ABDOMEN - 2 VIEW COMPARISON:  Abdominal radiograph dated 05/20/2023, CT abdomen and pelvis dated 05/28/2023 FINDINGS: Gastric/enteric tube tip projects over the stomach. Right lower quadrant jejunostomy tube is again seen. Enteric contrast material within the right lower quadrant. Excreted contrast within the urinary bladder. Surgical clips in the right upper quadrant and surgical sutures in the left hemiabdomen. A few gas-filled dilated loops of bowel are present in the bilateral lower abdomen. IMPRESSION: 1. A few gas-filled dilated loops of bowel are present  in the bilateral lower abdomen, which may represent ileus or small bowel obstruction. 2. Enteric contrast material within the right lower quadrant. Electronically Signed   By: Agustin Cree M.D.   On: 05/29/2023 08:45   DG Chest Portable 1 View Result Date: 05/28/2023 CLINICAL DATA:  Nasogastric tube placement EXAM: PORTABLE CHEST 1 VIEW COMPARISON:  09/17/2021 FINDINGS: The nasogastric tube side port is in the stomach body with tip in the stomach fundus satisfactorily positioned. The lungs appear clear. Cardiac and mediastinal contours normal. No blunting of the costophrenic angles. IMPRESSION: 1. Satisfactory positioning of the nasogastric tube. Electronically Signed  By: Freida Jes M.D.   On: 05/28/2023 14:52   CT ABDOMEN PELVIS W CONTRAST Result Date: 05/28/2023 CLINICAL DATA:  Acute abdominal pain and vomiting beginning this morning. Postop from lysis of adhesions, new Roux EN Y gastrojejunostomy and feeding jejunostomy. EXAM: CT ABDOMEN AND PELVIS WITH CONTRAST TECHNIQUE: Multidetector CT imaging of the abdomen and pelvis was performed using the standard protocol following bolus administration of intravenous contrast. RADIATION DOSE REDUCTION: This exam was performed according to the departmental dose-optimization program which includes automated exposure control, adjustment of the mA and/or kV according to patient size and/or use of iterative reconstruction technique. CONTRAST:  100mL OMNIPAQUE IOHEXOL 300 MG/ML  SOLN COMPARISON:  05/19/2023 FINDINGS: Lower Chest: No acute findings. Hepatobiliary: No suspicious hepatic masses identified. Small right and left hepatic lobe cysts are stable. Prior cholecystectomy. No evidence of biliary obstruction. Pancreas:  No mass or inflammatory changes. Spleen: Within normal limits in size and appearance. Adrenals/Urinary Tract: No suspicious masses identified. No evidence of ureteral calculi or hydronephrosis. Unremarkable unopacified urinary bladder.  Stomach/Bowel: Prior Roux-en-Y gastrojejunostomy again seen. Markedly dilated and fluid-filled small bowel a both the efferent and afferent limbs is seen to the level of the jejuno jejunostomy anastomosis in the left lower quadrant. Degree of dilatation is increased since previous study and consistent with small bowel obstruction. Feeding jejunostomy remains in place with tip in the right mid abdomen. A Vascular/Lymphatic: No pathologically enlarged lymph nodes. No acute vascular findings. Reproductive: Prior hysterectomy. No adnexal masses are identified. Small amount of free fluid noted in pelvic cul-de-sac. Other:  None. Musculoskeletal:  No suspicious bone lesions identified. IMPRESSION: Worsening small bowel obstruction, with transition point near the jejunojejunostomy anastomosis in the left lower quadrant. Small amount of free fluid in pelvic cul-de-sac. Electronically Signed   By: Marlyce Sine M.D.   On: 05/28/2023 14:17   CT ANGIO HEAD NECK W WO CM Result Date: 05/28/2023 CLINICAL DATA:  Pupil asymmetry. Vomiting today. Patient was discharged from [facility] yesterday following surgery for bowel obstruction 05/12/2023. EXAM: CT ANGIOGRAPHY HEAD AND NECK WITH AND WITHOUT CONTRAST TECHNIQUE: Multidetector CT imaging of the head and neck was performed using the standard protocol during bolus administration of intravenous contrast. Multiplanar CT image reconstructions and MIPs were obtained to evaluate the vascular anatomy. Carotid stenosis measurements (when applicable) are obtained utilizing NASCET criteria, using the distal internal carotid diameter as the denominator. RADIATION DOSE REDUCTION: This exam was performed according to the departmental dose-optimization program which includes automated exposure control, adjustment of the mA and/or kV according to patient size and/or use of iterative reconstruction technique. CONTRAST:  OMNIPAQUE IOHEXOL 300 MG/ML  SOLN COMPARISON:  CT  head without contrast 11/27/2010 FINDINGS: CT HEAD FINDINGS Brain: Mild atrophy and white matter changes are new since the prior exam. No acute infarct, hemorrhage, or mass lesion is present. The ventricles are of normal size. No significant extraaxial fluid collection is present. Midline structures are within normal limits. The brainstem and cerebellum are within normal limits. Vascular: No hyperdense vessel or unexpected calcification. Skull: Mild soft tissue swelling is present the left occipital scalp. No underlying fracture or foreign body is present. Sinuses/Orbits: The paranasal sinuses and mastoid air cells are clear. The globes and orbits are within normal limits. Review of the MIP images confirms the above findings CTA NECK FINDINGS Aortic arch: Common origin of the left common carotid artery and innominate artery is noted. Calcifications are present at the origins the great vessels. No focal stenosis is  present. No aneurysm or dissection is present. Right carotid system: The right common carotid artery is within normal limits. Bifurcation is unremarkable. Cervical right ICA is normal. Left carotid system: Left common carotid artery is within normal limits. Bifurcation is unremarkable. The cervical left ICA is normal. Vertebral arteries: Right vertebral artery is the dominant vessel. Both vertebral arteries originate from the subclavian arteries without significant stenosis. No significant stenosis is present in either vertebral artery in the neck. Skeleton: Mild degenerative changes are present in the cervical spine. Uncovertebral spurring contributes to foraminal narrowing at C5-6 and C6-7, right greater than left. Other neck: The soft tissues of the neck are otherwise unremarkable. Salivary glands are within normal limits. Thyroid is normal. No significant adenopathy is present. No focal mucosal or submucosal lesions are present. Upper chest: The lung apices are clear. Review of the MIP images confirms  the above findings CTA HEAD FINDINGS Anterior circulation: The internal carotid arteries are within normal limits from high cervical segments through the ICA termini. The A1 and M1 segments are normal. The MCA bifurcations are within normal limits. The anterior communicating artery is patent. The ACA and MCA branch vessels are within normal limits. No aneurysm is present. Posterior circulation: The PICA origins are visualized and normal. The left posterior scratched at the left vertebral artery is centrally terminates at the PICA. The basilar artery is diminutive, terminating at the superior cerebellar arteries, a normal variant. The posterior cerebral arteries are of fetal type bilaterally. Small P1 segments are noted. The PCA branch vessels are within normal limits bilaterally. Venous sinuses: The dural sinuses are patent. The straight sinus and deep cerebral veins are intact. Cortical veins are within normal limits. No significant vascular malformation is evident. Anatomic variants: Fetal type posterior cerebral arteries bilaterally. Review of the MIP images confirms the above findings IMPRESSION: 1. Normal CTA of the head and neck. No large vessel occlusion or hemodynamically significant stenosis. 2. Mild soft tissue swelling of the left occipital scalp without underlying fracture or foreign body. 3. Mild atrophy and white matter disease is new since the prior exam. This likely reflects the sequela of chronic microvascular ischemia. 4. Degenerative changes of the cervical spine as described. Electronically Signed   By: Marin Roberts M.D.   On: 05/28/2023 13:49    Labs:  CBC: Recent Labs    05/24/23 0610 05/28/23 1005 05/29/23 0457 06/20/23 1833  WBC 16.9* 13.5* 9.7 9.0  HGB 10.7* 11.8* 10.3* 12.3  HCT 30.5* 34.3* 29.3* 36.2  PLT 637* 864* 640* 421*    COAGS: No results for input(s): "INR", "APTT" in the last 8760 hours.  BMP: Recent Labs    05/29/23 0457 05/30/23 0506  05/31/23 0438 06/20/23 1833  NA 135 140 140 131*  K 3.6 4.1 3.9 3.5  CL 100 103 102 96*  CO2 29 29 28 26   GLUCOSE 107* 106* 140* 98  BUN 12 11 13 12   CALCIUM 7.4* 7.7* 8.0* 7.8*  CREATININE 0.64 0.59 0.61 0.46  GFRNONAA >60 >60 >60 >60    LIVER FUNCTION TESTS: Recent Labs    05/29/23 0457 05/30/23 0506 05/31/23 0438 06/20/23 1833  BILITOT 0.4 0.3 0.5 0.6  AST 16 19 21 30   ALT 16 17 16  32  ALKPHOS 88 87 86 99  PROT 4.4* 4.5* 4.6* 4.8*  ALBUMIN 1.6* 1.7* 1.7* 2.0*     Assessment and Plan:  Request for image guided jejunostomy tube replacement - Case reviewed and approved by Dr. Fredia Sorrow, plan to  upsize J-tube to 16 [place].  Procedure planned with moderate sedation. - 06/20/2023 CBC reviewed - VSS, afebrile - 06/20/2023 CT abdomen available and reviewed -ROS and physical exam negative for concern for new acute infection  No contraindications identified for procedure today.  Consent signed and in chart.  Risks and benefits image guided jejunostomy tube replacement was discussed with the patient including, but not limited to the need for a barium enema during the procedure, bleeding, infection, peritonitis and/or damage to adjacent structures.  All of the patient's questions were answered, patient is agreeable to proceed.  Consent signed and in chart.   Thank you for allowing our service to participate in Tanya Nguyen 's care.    Electronically Signed: Terressa Fess, NP   06/21/2023, 9:41 AM     I spent a total of 20 Minutes    in face to face in clinical consultation, greater than 50% of which was counseling/coordinating care for image guided jejunostomy exchange yeah okay   (A copy of this note was sent to the referring provider and the time of visit.)

## 2023-06-21 NOTE — Plan of Care (Signed)
 Received via bed from ED. Reports is having 6/10 abdomin pain @ J tube site. Dressing dislodged. Assessment completed. Informed of NPO at MN, given HS meds as ordered. Taking sips of water, tolerated well. PIV to L wrist patent, with good blood return noted. Infusing D5/NS @ 75 via pump. G tube to mid abdomen draining stomach contents. J tube clamped, sutured in place. Dressing changed to J tube site, and taped securely. Reports J tube feels better now.  0100  Up to bedside commode with assistance, gown placed and sacral foam in place. Returned to bed with assistance. Tolerated well.  0047 sleeping when checked.  0981 Sleeping when checked.  0630 Slept on and off, 4-5 hrs.

## 2023-06-22 LAB — BASIC METABOLIC PANEL WITH GFR
Anion gap: 5 (ref 5–15)
BUN: 10 mg/dL (ref 6–20)
CO2: 26 mmol/L (ref 22–32)
Calcium: 7.5 mg/dL — ABNORMAL LOW (ref 8.9–10.3)
Chloride: 106 mmol/L (ref 98–111)
Creatinine, Ser: 0.53 mg/dL (ref 0.44–1.00)
GFR, Estimated: >60 mL/min (ref >60)
Glucose, Bld: 122 mg/dL — ABNORMAL HIGH (ref 70–99)
Potassium: 3.5 mmol/L (ref 3.5–5.1)
Sodium: 137 mmol/L (ref 135–145)

## 2023-06-22 LAB — MAGNESIUM: Magnesium: 1.7 mg/dL (ref 1.7–2.4)

## 2023-06-22 LAB — PHOSPHORUS: Phosphorus: 3.9 mg/dL (ref 2.5–4.6)

## 2023-06-22 NOTE — Care Management Obs Status (Deleted)
 MEDICARE OBSERVATION STATUS NOTIFICATION   Patient Details  Name: Tanya Nguyen MRN: [id number] Date of Birth: 11-Apr-1965   Medicare Observation Status Notification Given:       [name] 06/22/2023, 11:02 AM

## 2023-06-22 NOTE — Care Management Obs Status (Deleted)
 MEDICARE OBSERVATION STATUS NOTIFICATION   Patient Details  Name: Tanya Nguyen MRN: [id number] Date of Birth: [birth year]   Medicare Observation Status Notification Given:       [name] 06/22/2023, 11:10 AM

## 2023-06-22 NOTE — Discharge Summary (Signed)
 [facility] SURGICAL DISCHARGE SUMMARY  Patient ID: Tanya Nguyen MRN: [id number] DOB/AGE: Sep 23, 1965 58 y.o.  Admit date: 06/20/2023 Discharge date: 06/22/2023  Discharge Diagnoses Patient Active Problem List   Diagnosis Date Noted   Feeding tube dysfunction 06/20/2023   Jejunostomy malfunction (HCC) 06/07/2023    Consultants Interventional Radiology   HPI: Tanya Nguyen is a 58 y.o. female presenting for management of a clogged feeding jejunostomy tube.  She is s/p revision of gastrojejunostomy to a RNY type gastrojejunostomy, with a distal feeding jejunostomy.  She also required a venting gastrostomy tube due to issues with persistent nausea.  Unfortunately, over the past 14 days, she has had issues with a clogged J tube which was exchanged on 06/07/23, a dislodged J tube which was replaced on 06/17/23, and now a clogged J tube again. The patient reports that she's been tolerating a full liquid diet.  She drinks smoothies with protein powder.  She vents her G tube, but does not report much output from it.  She is also tolerating her J tube feeds at night.  Her pain is overall stable and she denies any worsening pain.  Hospital Course: Patient was admitted to general surgery service with interventional radiology consultation. IR was able to exchange jejunostomy tube for 16 Fr feeding tube with balloon on 04/15 without issue. Tube feedings were restarting without incident. She otherwise did well. She was able to ambulate with PT and did well, will continue HHPT. The remainder of patient's hospital course was essentially unremarkable, and discharge planning was initiated accordingly with patient safely able to be discharged home with appropriate discharge instructions, pain control, and outpatient follow-up after all of her questions were answered to her expressed satisfaction.   Discharge Condition: Good   Physical Examination:  Constitutional: alert, cooperative and no  distress  Respiratory: breathing non-labored at rest  Cardiovascular: regular rate and sinus rhythm  Gastrointestinal: soft, non-tender, and non-distended. G-tube in LUQ; to gravity; output with gastric contents - clamped at bedside this AM. 16 Fr Jejunostomy in left abdomen; TF running Integumentary: Laparotomy is well healed    Allergies as of 06/22/2023       Reactions   Penicillins Shortness Of Breath, Diarrhea, Nausea And Vomiting   TOLERATED CEPHALOSPORINS   Nsaids Other (See Comments)   Pt states NSAIDS cause "internal bleeding"   Gabapentin Other (See Comments)   Hair Loss   Pregabalin    Tremors   Sulfa Antibiotics Nausea And Vomiting        Medication List     TAKE these medications    acetaminophen 500 MG tablet Commonly known as: TYLENOL Take 500-1,000 mg by mouth every 6 (six) hours as needed for moderate pain (pain score 4-6) or headache.   ARIPiprazole 2 MG tablet Commonly known as: ABILIFY Take 1 tablet (2 mg total) by mouth at bedtime.   bisacodyl 5 MG EC tablet Commonly known as: DULCOLAX Take 5 mg by mouth daily as needed for moderate constipation.   diphenhydrAMINE 25 MG tablet Commonly known as: BENADRYL Take 25 mg by mouth every 6 (six) hours as needed for allergies.   hydrOXYzine 25 MG tablet Commonly known as: ATARAX Take 1 tablet (25 mg total) by mouth daily as needed for anxiety.   levothyroxine 50 MCG tablet Commonly known as: SYNTHROID Take 1 tablet by mouth every morning on an empty stomach with water only.  No food or other medications for 30 minutes.   mirtazapine 15 MG tablet Commonly known as:  REMERON Take 1 tablet (15 mg total) by mouth at bedtime.   multivitamin with minerals Tabs tablet Take 1 tablet by mouth daily.   ondansetron 4 MG disintegrating tablet Commonly known as: ZOFRAN-ODT Take 1 tablet (4 mg total) by mouth every 6 (six) hours as needed for nausea.   oxybutynin 5 MG 24 hr tablet Commonly known as:  DITROPAN-XL TAKE 1 TABLET (5 MG TOTAL) BY MOUTH AT BEDTIME. FOR BLADDER INCONTINENCE.   oxyCODONE 5 MG/5ML solution Commonly known as: ROXICODONE Place 5 mLs (5 mg total) into feeding tube every 6 (six) hours as needed for severe pain (pain score 7-10).   pantoprazole 40 MG tablet Commonly known as: PROTONIX Take 1 tablet (40 mg total) by mouth daily.   SUMAtriptan 50 MG tablet Commonly known as: IMITREX Take 1 tablet at migraine onset. May repeat in 2 hours if headache persists or recurs.   traMADol 50 MG tablet Commonly known as: ULTRAM Take 1 tablet (50 mg total) by mouth every 12 (twelve) hours as needed.          Follow-up Information     Emmalene Hare, MD. Go on 07/11/2023.   Specialty: General Surgery Why: Go to appointment on 05/05 as scheduled Contact information: [address] [place] [address] [phone]                  Time spent on discharge management including discussion of hospital course, clinical condition, outpatient instructions, prescriptions, and follow up with the patient and members of the medical team: >30 minutes  -- Apolonio Bay , PA-C [facility]  06/22/2023, 12:10 PM [phone] M-F: 7am - 4pm

## 2023-06-22 NOTE — Care Management Obs Status (Signed)
 MEDICARE OBSERVATION STATUS NOTIFICATION   Patient Details  Name: Tanya Nguyen MRN: [id number] Date of Birth: 07/07/1965   Medicare Observation Status Notification Given:  Yes    [name] 06/22/2023, 11:14 AM

## 2023-06-22 NOTE — TOC Initial Note (Signed)
 Transition of Care [facility]) - Initial/Assessment Note    Patient Details  Name: Tanya Nguyen MRN: [id number] Date of Birth: [birth year]  [facility] CM/SW Contact:    Chapman Fitch, RN Phone Number: 06/22/2023, 10:54 AM  Clinical Narrative:                  Patient from home with husband  Her cousin is her support system Patient already receives tube feeds and supplies from [facility] Active with [facility] home health Per PT resume home health, and no DME needs.  Patient received needed DME previous admission   MD inquired about facility placement to assess with tube feed management.  Tube feeds is not a skilled need for placement.  Patient confirms they are not interested in private pay placement   Plan will be to return home with resumption of home health services through [facility] at discharge        Patient Goals and CMS Choice            Expected Discharge Plan and Services                                              Prior Living Arrangements/Services                       Activities of Daily Living   ADL Screening (condition at time of admission) Independently performs ADLs?: No Does the patient have a NEW difficulty with bathing/dressing/toileting/self-feeding that is expected to last >3 days?: No Does the patient have a NEW difficulty with getting in/out of bed, walking, or climbing stairs that is expected to last >3 days?: No Does the patient have a NEW difficulty with communication that is expected to last >3 days?: No Is the patient deaf or have difficulty hearing?: No Does the patient have difficulty seeing, even when wearing glasses/contacts?: No Does the patient have difficulty concentrating, remembering, or making decisions?: No  Permission Sought/Granted                  Emotional Assessment              Admission diagnosis:  Jejunostomy malfunction (HCC) [K94.13] Feeding tube dysfunction [T85.598A] Patient  Active Problem List   Diagnosis Date Noted   Feeding tube dysfunction 06/20/2023   Jejunostomy malfunction (HCC) 06/07/2023   Gastric outlet obstruction 05/12/2023   Nausea vomiting and diarrhea 03/30/2023   Hyponatremia 03/29/2023   Hypomagnesemia 03/29/2023   Coffee ground emesis 03/29/2023   Hypokalemia 03/29/2023   Thrombocytosis 03/29/2023   Prediabetes 11/23/2021   Preventative health care 11/23/2021   Pain management contract signed 09/17/2021   Muscle spasm 09/17/2021   Anxiety 09/02/2021   Iron deficiency anemia 05/18/2021   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   Bilateral foot pain 03/13/2020   Chronic pain syndrome 03/13/2020   Idiopathic peripheral neuropathy 07/23/2019   Urge incontinence 08/11/2018   Protein-calorie malnutrition, severe 01/20/2018   Vitamin D deficiency 07/28/2017   Vitamin B 12 deficiency 07/28/2017   Chronic low back pain 07/28/2017   Plantar fascial fibromatosis of both feet 11/10/2016   Bipolar I disorder, most recent episode depressed (HCC) 03/01/2015   PTSD (post-traumatic stress disorder) 02/27/2015   Weakness 12/13/2013   Insomnia 08/30/2013   Weight loss 03/22/2013   GERD (gastroesophageal reflux disease)    Migraines  Hypothyroidism    Depression    PCP:  Gabriel John, NP Pharmacy:   [facility] [address], [place] - [address] [place] [address] Phone: [phone] Fax: [phone]     Social Drivers of Health (SDOH) Social History: SDOH Screenings   Food Insecurity: No Food Insecurity (06/21/2023)  Housing: Low Risk  (06/21/2023)  Transportation Needs: No Transportation Needs (06/21/2023)  Utilities: Not At Risk (06/21/2023)  Depression (PHQ2-9): Medium Risk (03/17/2023)  Social Connections: Unknown (06/21/2023)  Tobacco Use: Low Risk  (06/17/2023)   SDOH Interventions:     Readmission Risk Interventions    05/20/2023   12:24 PM  Readmission Risk Prevention Plan  Transportation  Screening Complete  PCP or Specialist Appt within 5-7 Days Complete  Home Care Screening Complete  Medication Review (RN CM) Complete

## 2023-06-22 NOTE — Care Management Obs Status (Deleted)
 MEDICARE OBSERVATION STATUS NOTIFICATION   Patient Details  Name: Shereen Marton MRN: [id number] Date of Birth: 1965/12/31   Medicare Observation Status Notification Given:       [name] 06/22/2023, 10:00 AM

## 2023-06-22 NOTE — Discharge Instructions (Signed)
 J-Tube: Remember to flush J-tube multiple times daily with 5-10 ml of water/normal saline to help prevent clogging of this. Continue nocturnal feeds via this as needed  G-Tube: It is okay to leave this clamped as long as you are without nausea, distension, or pain. You can briefly allow this to passively drain if these symptoms develop. The long term goal is to need need venting from this.   Follow up with Dr Mauri Sous on 05/05 as scheduled  Call the office [phone]) as needed or with questions/concerns.

## 2023-06-22 NOTE — Evaluation (Signed)
 Physical Therapy Evaluation Patient Details Name: Tanya Nguyen MRN: [id number] DOB: [birth year] Today's Date: 06/22/2023  History of Present Illness  Pt admitted for feeding tube dysfunction. History includes GERD, migraines, upper GI bleed. Pt is POD 1 from new PEG tube placement, cleared to participate via MD.  Clinical Impression  Pt is a pleasant 58 year old female who was admitted for feeding tube dysfunction. Pt performs bed mobility with independence, transfers with mod I, and ambulation with supervision and RW. Pt demonstrates all bed mobility/transfers/ambulation at baseline level. Pt is currently active with HHPT and plans to resume therapy once discharged. Will dc in house at this time. Cleared to participate with mobility specialists.       If plan is discharge home, recommend the following: Help with stairs or ramp for entrance   Can travel by private vehicle        Equipment Recommendations None recommended by PT  Recommendations for Other Services       Functional Status Assessment Patient has not had a recent decline in their functional status     Precautions / Restrictions Precautions Precautions: None Recall of Precautions/Restrictions: Intact Restrictions Weight Bearing Restrictions Per Provider Order: No      Mobility  Bed Mobility Overal bed mobility: Independent             General bed mobility comments: safe technique    Transfers Overall transfer level: Modified independent Equipment used: Rolling walker (2 wheels)               General transfer comment: safe technique with upright posture    Ambulation/Gait Ambulation/Gait assistance: Supervision Gait Distance (Feet): 200 Feet Assistive device: Rolling walker (2 wheels) Gait Pattern/deviations: Step-through pattern       General Gait Details: ambulated in hallway using reciprocal gait pattern and safe technique. No fatigue or SOB  Stairs            Wheelchair  Mobility     Tilt Bed    Modified Rankin (Stroke Patients Only)       Balance Overall balance assessment: Mild deficits observed, not formally tested                                           Pertinent Vitals/Pain Pain Assessment Pain Assessment: No/denies pain    Home Living Family/patient expects to be discharged to:: Private residence Living Arrangements: Spouse/significant other Available Help at Discharge: Family Type of Home: House Home Access: Stairs to enter (does have entry in back without stairs) Entrance Stairs-Rails: Can reach both Entrance Stairs-Number of Steps: 4   Home Layout: One level Home Equipment: Rolling Walker (2 wheels);Grab bars - tub/shower;Shower seat      Prior Function Prior Level of Function : Independent/Modified Independent             Mobility Comments: previously indep with all mobility. Multiple falls with needing husband assist for recovery. Currently receiving HHPT ADLs Comments: indep     Extremity/Trunk Assessment   Upper Extremity Assessment Upper Extremity Assessment: Overall WFL for tasks assessed    Lower Extremity Assessment Lower Extremity Assessment: Overall WFL for tasks assessed       Communication   Communication Communication: No apparent difficulties    Cognition Arousal: Alert Behavior During Therapy: WFL for tasks assessed/performed   PT - Cognitive impairments: No apparent impairments  PT - Cognition Comments: pleasant and agreeable to session Following commands: Intact       Cueing Cueing Techniques: Verbal cues     General Comments      Exercises     Assessment/Plan    PT Assessment All further PT needs can be met in the next venue of care  PT Problem List Decreased activity tolerance;Decreased balance       PT Treatment Interventions      PT Goals (Current goals can be found in the Care Plan section)  Acute Rehab PT  Goals Patient Stated Goal: to go home PT Goal Formulation: All assessment and education complete, DC therapy Time For Goal Achievement: 06/22/23 Potential to Achieve Goals: Good    Frequency       Co-evaluation               AM-PAC PT "6 Clicks" Mobility  Outcome Measure Help needed turning from your back to your side while in a flat bed without using bedrails?: None Help needed moving from lying on your back to sitting on the side of a flat bed without using bedrails?: None Help needed moving to and from a bed to a chair (including a wheelchair)?: None Help needed standing up from a chair using your arms (e.g., wheelchair or bedside chair)?: None Help needed to walk in hospital room?: A Little Help needed climbing 3-5 steps with a railing? : A Little 6 Click Score: 22    End of Session   Activity Tolerance: Patient tolerated treatment well Patient left: in bed Nurse Communication: Mobility status PT Visit Diagnosis: Unsteadiness on feet (R26.81)    Time: 3536-1443 PT Time Calculation (min) (ACUTE ONLY): 9 min   Charges:   PT Evaluation $PT Eval Low Complexity: 1 Low   PT General Charges $$ ACUTE PT VISIT: 1 Visit         Tanya Nguyen, PT, DPT, GCS [phone]   Tanya Nguyen 06/22/2023, 10:53 AM

## 2023-06-22 NOTE — Care Management Obs Status (Deleted)
 MEDICARE OBSERVATION STATUS NOTIFICATION   Patient Details  Name: Tanya Nguyen MRN: [id number] Date of Birth: 06/22/1965   Medicare Observation Status Notification Given:       [name] 06/22/2023, 9:46 AM

## 2023-06-22 NOTE — Progress Notes (Signed)
 Patient being discharged home via family.

## 2023-06-22 NOTE — Care Management Obs Status (Signed)
 MEDICARE OBSERVATION STATUS NOTIFICATION   Patient Details  Name: Tanya Nguyen MRN: [id number] Date of Birth: 1965/11/04   Medicare Observation Status Notification Given:       [name] 06/22/2023, 11:11 AM

## 2023-06-22 NOTE — Progress Notes (Signed)
 Patient discharging at this time with family. All discharge instructions gone over with patient. All questions answered. Patient in no pain or distress at time of discharge.

## 2023-06-23 ENCOUNTER — Other Ambulatory Visit: Payer: Self-pay | Admitting: Surgery

## 2023-06-23 ENCOUNTER — Telehealth: Payer: Self-pay | Admitting: Surgery

## 2023-06-23 MED ORDER — OXYCODONE HCL 5 MG/5ML PO SOLN: 5.0000 mg | Freq: Four times a day (QID) | ORAL | 0 refills | Status: DC | PRN

## 2023-06-23 NOTE — Telephone Encounter (Signed)
 Pt was released from hospital yesterday and now the patient is crying uncontrol able saying she is in a lot of pain with her G tube and J tube . Patient cousin Diana Forster is asking for a call back at [phone] as to what they need to do.

## 2023-06-24 DIAGNOSIS — Z934 Other artificial openings of gastrointestinal tract status: Secondary | ICD-10-CM | POA: Diagnosis not present

## 2023-06-24 DIAGNOSIS — E43 Unspecified severe protein-calorie malnutrition: Secondary | ICD-10-CM | POA: Diagnosis not present

## 2023-06-24 DIAGNOSIS — K315 Obstruction of duodenum: Secondary | ICD-10-CM | POA: Diagnosis not present

## 2023-06-24 LAB — COPPER, SERUM: Copper: 71 ug/dL — ABNORMAL LOW (ref 80–158)

## 2023-06-24 LAB — ZINC: Zinc: 50 ug/dL (ref 44–115)

## 2023-06-26 ENCOUNTER — Other Ambulatory Visit: Payer: Self-pay | Admitting: Surgery

## 2023-06-26 LAB — VITAMIN A: Vitamin A (Retinoic Acid): 21.8 ug/dL (ref 20.1–62.0)

## 2023-06-27 DIAGNOSIS — K315 Obstruction of duodenum: Secondary | ICD-10-CM | POA: Diagnosis not present

## 2023-06-28 ENCOUNTER — Other Ambulatory Visit: Payer: Self-pay | Admitting: *Deleted

## 2023-06-28 DIAGNOSIS — Z980 Intestinal bypass and anastomosis status: Secondary | ICD-10-CM

## 2023-06-29 NOTE — Progress Notes (Signed)
 Complex Care Management Note  Care Guide Note 06/29/2023 Name: Vici Novick MRN: [id number] DOB: [birth year]  Laren Orama is a 58 y.o. year old female who sees Gabriel John, NP for primary care. I reached out to Jim Motts by phone today to offer complex care management services.  Ms. Eutsler was given information about Complex Care Management services today including:   The Complex Care Management services include support from the care team which includes your Nurse Care Manager, Clinical Social Worker, or Pharmacist.  The Complex Care Management team is here to help remove barriers to the health concerns and goals most important to you. Complex Care Management services are voluntary, and the patient may decline or stop services at any time by request to their care team member.   Complex Care Management Consent Status: Patient agreed to services and verbal consent obtained.   Follow up plan:  Telephone appointment with complex care management team member scheduled for:  07/07/2023  Encounter Outcome:  Patient Scheduled  Lenton Rail , RMA     [place]  [facility], [facility] Guide  Direct Dial: [phone]  Website: [email or web address]

## 2023-07-01 ENCOUNTER — Ambulatory Visit: Admitting: Primary Care

## 2023-07-01 ENCOUNTER — Other Ambulatory Visit (HOSPITAL_COMMUNITY): Payer: Self-pay

## 2023-07-01 ENCOUNTER — Encounter: Payer: Self-pay | Admitting: Primary Care

## 2023-07-01 ENCOUNTER — Telehealth: Payer: Self-pay

## 2023-07-01 VITALS — BP 110/64 | HR 106 | Temp 96.8°F | Ht 59.0 in | Wt 73.0 lb

## 2023-07-01 DIAGNOSIS — E038 Other specified hypothyroidism: Secondary | ICD-10-CM

## 2023-07-01 DIAGNOSIS — Z980 Intestinal bypass and anastomosis status: Secondary | ICD-10-CM | POA: Diagnosis not present

## 2023-07-01 DIAGNOSIS — R110 Nausea: Secondary | ICD-10-CM

## 2023-07-01 DIAGNOSIS — E43 Unspecified severe protein-calorie malnutrition: Secondary | ICD-10-CM

## 2023-07-01 LAB — TSH: TSH: 7.59 u[IU]/mL — ABNORMAL HIGH (ref 0.35–5.50)

## 2023-07-01 MED ORDER — ONDANSETRON 4 MG PO TBDP: 4.0000 mg | ORAL_TABLET | Freq: Three times a day (TID) | ORAL | 0 refills | Status: DC | PRN

## 2023-07-01 NOTE — Assessment & Plan Note (Signed)
 Secondary to feeding tube malfunction and feeding tube state.  Referral placed for nutritionist. Continue with tube feedings.  Continue with surgery follow-up.

## 2023-07-01 NOTE — Progress Notes (Signed)
 Subjective:    Patient ID: Tanya Nguyen, female    DOB: 09-10-1965, 58 y.o.   MRN: [id number]  HPI  Tanya Nguyen is a very pleasant 58 y.o. female with a history of gastric outlet obstruction, nausea vomiting and diarrhea, GERD, migraines, bipolar disorder, PTSD, status post bypassed gastrojejunostomy, jejunostomy malfunction who presents today for hospital follow-up.  Her friend joins us  today.  Multiple admissions in March to April 2025.    She underwent exploratory laparotomy, lysis of adhesions, resection of previous gastrojejunostomy and jejunostomy, creation of new Roux-en-Y gastrojejunostomy, new feeding jejunostomy in March 2025.   She was readmitted for complications of the feeding pump with symptoms of abdominal distention, nausea, multiple episodes of emesis.  During this hospitalization she underwent NGT placement and underwent venting gastric tube placement on 05/30/2023.  She tolerated this well.  Discharged home on 06/03/2023.  Readmitted to [facility] hospital on 06/20/2023 for clogged feeding jejunostomy tube.  She underwent jejunostomy tube exchange for 16 French feeding tube with balloon on 06/21/2023.  No complications.  Patient was discharged home on 06/22/2023.  Today she's feeling better overall. She continues to experience nausea on a daily basis. She will take Zofran  4 mg PRN. She is on a liquid diet and tube feedings currently due to prior episodes of vomiting. She has a follow up appointment scheduled with her surgeon in a few weeks. She is not seeing a nutritionist, is having trouble gaining weight. She is visited by physical therapy and nursing weekly. She is living at home with her husband. She has support with friends and family. Her strength is improving.   She is due for repeat thyroid  studies today. She has been taking levothyroxine  50 mcg up until 1 week ago when she increased her dose to 75 mcg.   BP Readings from Last 3 Encounters:  07/01/23 110/64  06/22/23 113/73   06/17/23 118/79   Wt Readings from Last 3 Encounters:  07/01/23 73 lb (33.1 kg)  06/22/23 69 lb 10.7 oz (31.6 kg)  06/17/23 72 lb 14.4 oz (33.1 kg)      Review of Systems  Constitutional:  Negative for fever.  Gastrointestinal:  Positive for diarrhea and nausea. Negative for abdominal pain, constipation and vomiting.  Skin:  Negative for color change.         Past Medical History:  Diagnosis Date   Acquired pyloric stricture    Bipolar disorder (HCC)    Bleeding duodenal ulcer    CAP (community acquired pneumonia) 02/12/2020   Chronic esophagogastric ulcer    Chronic low back pain    Chronic pain syndrome    Coffee ground emesis 03/29/2023   Constipation 04/02/2014   Depression    Duodenal obstruction    Gastric outlet obstruction 01/19/2018   GERD (gastroesophageal reflux disease)    Hair loss 09/22/2018   Head injury 12/24/2022   History of stomach ulcers    Hypothyroidism    IDA (iron  deficiency anemia)    Incisional hernia, without obstruction or gangrene    Intractable vomiting    Kidney stones    Melena 05/08/2021   Migraine    Neuropathy    Nodule of middle lobe of right lung 09/17/2021   NSAID long-term use 09/16/2016   Pre-diabetes    Protein-calorie malnutrition, severe (HCC)    PTSD (post-traumatic stress disorder)    Right-sided chest pain 09/17/2021   Sacroiliac joint pain 08/14/2018   Side pain 03/30/2018   Small bowel anastomotic dilation  Thrombocytosis    Upper GI bleed 05/18/2021   Vitamin B12 deficiency    Vitamin D  deficiency     Social History   Socioeconomic History   Marital status: Married    Spouse name: Not on file   Number of children: 0   Years of education: 12   Highest education level: High school graduate  Occupational History   Occupation: Homemaker  Tobacco Use   Smoking status: Never    Passive exposure: Never   Smokeless tobacco: Never  Vaping Use   Vaping status: Never Used  Substance and Sexual  Activity   Alcohol use: Not Currently    Alcohol/week: 0.0 standard drinks of alcohol   Drug use: No   Sexual activity: Not Currently    Birth control/protection: None  Other Topics Concern   Not on file  Social History Narrative   Lives at home with her husband.   Right-handed.   Rare caffeine.   Social Drivers of Corporate investment banker Strain: Not on file  Food Insecurity: No Food Insecurity (06/21/2023)   Hunger Vital Sign    Worried About Running Out of Food in the Last Year: Never true    Ran Out of Food in the Last Year: Never true  Transportation Needs: No Transportation Needs (06/21/2023)   PRAPARE - Administrator, Civil Service (Medical): No    Lack of Transportation (Non-Medical): No  Physical Activity: Not on file  Stress: Not on file  Social Connections: Unknown (06/21/2023)   Social Connection and Isolation Panel [NHANES]    Frequency of Communication with Friends and Family: More than three times a week    Frequency of Social Gatherings with Friends and Family: Twice a week    Attends Religious Services: Patient declined    Active Member of Clubs or Organizations: Yes    Attends Banker Meetings: Patient declined    Marital Status: Patient declined  Intimate Partner Violence: Not At Risk (06/21/2023)   Humiliation, Afraid, Rape, and Kick questionnaire    Fear of Current or Ex-Partner: No    Emotionally Abused: No    Physically Abused: No    Sexually Abused: No    Past Surgical History:  Procedure Laterality Date   ABDOMINAL HYSTERECTOMY  03/08/2002   partial   bypass gastrojejunostomy     CHOLECYSTECTOMY     COLONOSCOPY WITH PROPOFOL  N/A 02/27/2018   Procedure: COLONOSCOPY WITH PROPOFOL ;  Surgeon: Marnee Sink, MD;  Location: [facility];  Service: Endoscopy;  Laterality: N/A;   ESOPHAGOGASTRODUODENOSCOPY (EGD) WITH PROPOFOL  N/A 01/12/2018   Procedure: ESOPHAGOGASTRODUODENOSCOPY (EGD) WITH BIOPSIES;  Surgeon: Marnee Sink, MD;  Location: [facility];  Service: Endoscopy;  Laterality: N/A;   ESOPHAGOGASTRODUODENOSCOPY (EGD) WITH PROPOFOL  N/A 01/25/2018   Procedure: ESOPHAGOGASTRODUODENOSCOPY (EGD) WITH PROPOFOL ;  Surgeon: Luke Salaam, MD;  Location: [facility];  Service: Gastroenterology;  Laterality: N/A;   ESOPHAGOGASTRODUODENOSCOPY (EGD) WITH PROPOFOL  N/A 02/27/2018   Procedure: ESOPHAGOGASTRODUODENOSCOPY (EGD) WITH PROPOFOL ;  Surgeon: Marnee Sink, MD;  Location: [facility];  Service: Endoscopy;  Laterality: N/A;   ESOPHAGOGASTRODUODENOSCOPY (EGD) WITH PROPOFOL  N/A 05/09/2021   Procedure: ESOPHAGOGASTRODUODENOSCOPY (EGD) WITH PROPOFOL ;  Surgeon: Quintin Buckle, DO;  Location: [facility];  Service: Gastroenterology;  Laterality: N/A;   ESOPHAGOGASTRODUODENOSCOPY (EGD) WITH PROPOFOL  N/A 03/09/2022   Procedure: ESOPHAGOGASTRODUODENOSCOPY (EGD) WITH PROPOFOL ;  Surgeon: Marnee Sink, MD;  Location: [facility];  Service: Endoscopy;  Laterality: N/A;   ESOPHAGOGASTRODUODENOSCOPY (EGD) WITH PROPOFOL  N/A 03/30/2023  Procedure: ESOPHAGOGASTRODUODENOSCOPY (EGD) WITH PROPOFOL ;  Surgeon: Marnee Sink, MD;  Location: [facility] END[facility];  Service: Endoscopy;  Laterality: N/A;   GASTROJEJUNOSTOMY N/A 05/12/2023   Procedure: GASTROJEJUNOSTOMY, revision, Apolonio Bay, PA-C to assist, possible feeding jejunostomy;  Surgeon: Emmalene Hare, MD;  Location: [facility];  Service: General;  Laterality: N/A;   INCISIONAL HERNIA REPAIR N/A 11/23/2018   Procedure: HERNIA REPAIR INCISIONAL;  Surgeon: Emmalene Hare, MD;  Location: [facility];  Service: General;  Laterality: N/A;   INSERTION OF MESH N/A 11/23/2018   Procedure: INSERTION OF MESH;  Surgeon: Emmalene Hare, MD;  Location: [facility];  Service: General;  Laterality: N/A;   IR GASTROSTOMY TUBE MOD SED  05/30/2023   IR REPLC DUODEN/JEJUNO TUBE PERCUT W/FLUORO  06/07/2023   IR REPLC DUODEN/JEJUNO TUBE PERCUT W/FLUORO  06/21/2023   IR REPLC  GASTRO/COLONIC TUBE PERCUT W/FLUORO  06/17/2023   LAPAROTOMY N/A 01/20/2018   Procedure: EXPLORATORY LAPAROTOMY;  Surgeon: Emmalene Hare, MD;  Location: [facility];  Service: General;  Laterality: N/A;   LYSIS OF ADHESION N/A 05/12/2023   Procedure: LYSIS OF ADHESION, open, Apolonio Bay, PA-C to assist;  Surgeon: Emmalene Hare, MD;  Location: [facility];  Service: General;  Laterality: N/A;   TOOTH EXTRACTION      Family History  Problem Relation Age of Onset   Cancer Mother    Breast cancer Mother 25   Lung cancer Father    Drug abuse Sister    Alcohol abuse Sister    Bipolar disorder Sister    Alcohol abuse Maternal Grandmother    Stroke Maternal Grandmother    Stroke Paternal Grandmother     Allergies  Allergen Reactions   Penicillins Shortness Of Breath, Diarrhea and Nausea And Vomiting    TOLERATED CEPHALOSPORINS   Nsaids Other (See Comments)    Pt states NSAIDS cause "internal bleeding"   Gabapentin  Other (See Comments)    Hair Loss    Pregabalin      Tremors   Sulfa Antibiotics Nausea And Vomiting    Current Outpatient Medications on File Prior to Visit  Medication Sig Dispense Refill   acetaminophen  (TYLENOL ) 500 MG tablet Take 500-1,000 mg by mouth every 6 (six) hours as needed for moderate pain (pain score 4-6) or headache.     ARIPiprazole  (ABILIFY ) 2 MG tablet Take 1 tablet (2 mg total) by mouth at bedtime. 90 tablet 0   bisacodyl (DULCOLAX) 5 MG EC tablet Take 5 mg by mouth daily as needed for moderate constipation.     diphenhydrAMINE  (BENADRYL ) 25 MG tablet Take 25 mg by mouth every 6 (six) hours as needed for allergies.     hydrOXYzine  (ATARAX ) 25 MG tablet Take 1 tablet (25 mg total) by mouth daily as needed for anxiety. 90 tablet 0   levothyroxine  (SYNTHROID ) 50 MCG tablet Take 1 tablet by mouth every morning on an empty stomach with water  only.  No food or other medications for 30 minutes. 90 tablet 0   Multiple Vitamin (MULTIVITAMIN WITH MINERALS) TABS tablet  Take 1 tablet by mouth daily.     oxyCODONE  (ROXICODONE ) 5 MG/5ML solution Place 5 mLs (5 mg total) into feeding tube every 6 (six) hours as needed for severe pain (pain score 7-10). 150 mL 0   pantoprazole  (PROTONIX ) 40 MG tablet Take 1 tablet (40 mg total) by mouth daily. 90 tablet 3   SUMAtriptan  (IMITREX ) 50 MG tablet Take 1 tablet at migraine onset. May repeat in 2 hours if headache persists or recurs. 9 tablet  0   mirtazapine  (REMERON ) 15 MG tablet Take 1 tablet (15 mg total) by mouth at bedtime. (Patient not taking: Reported on 06/20/2023) 90 tablet 0   oxybutynin  (DITROPAN -XL) 5 MG 24 hr tablet TAKE 1 TABLET (5 MG TOTAL) BY MOUTH AT BEDTIME. FOR BLADDER INCONTINENCE. (Patient not taking: Reported on 07/01/2023) 90 tablet 2   traMADol  (ULTRAM ) 50 MG tablet Take 1 tablet (50 mg total) by mouth every 12 (twelve) hours as needed. (Patient not taking: Reported on 06/20/2023) 60 tablet 2   No current facility-administered medications on file prior to visit.    BP 110/64   Pulse (!) 106   Temp (!) 96.8 F (36 C) (Temporal)   Ht 4\' 11"  (1.499 m)   Wt 73 lb (33.1 kg)   SpO2 98%   BMI 14.74 kg/m  Objective:   Physical Exam Constitutional:      Appearance: She is underweight.  Cardiovascular:     Rate and Rhythm: Normal rate and regular rhythm.  Pulmonary:     Effort: Pulmonary effort is normal.     Breath sounds: Normal breath sounds.  Abdominal:     Palpations: Abdomen is soft.     Comments: Both feeding tubes intact   Skin:    General: Skin is warm and dry.  Neurological:     Mental Status: She is alert.           Assessment & Plan:  Other specified hypothyroidism Assessment & Plan: Repeat TSH pending.  She may continue her levothyroxine  75 mcg tablets for now, has mostly been taking 50 mcg. Await results.  Orders: -     TSH  Protein-calorie malnutrition, severe Assessment & Plan: Secondary to feeding tube malfunction and feeding tube state.  Referral placed for  nutritionist. Continue with tube feedings.  Continue with surgery follow-up.  Orders: -     Amb ref to Medical Nutrition Therapy-MNT  S/P bypass gastrojejunostomy Assessment & Plan: With hospitalizations in March and April 2025. Hospital notes, labs, imaging reviewed.  Continue tube feedings as directed. Follow-up with surgery as scheduled. Referral placed for nutritionist.  Orders: -     Amb ref to Medical Nutrition Therapy-MNT  Nausea Assessment & Plan: Slowly improving.  Continue ondansetron  tabs as needed.  Discussed use sparingly. Refill provided.  Orders: -     Ondansetron ; Take 1 tablet (4 mg total) by mouth every 8 (eight) hours as needed for nausea.  Dispense: 20 tablet; Refill: 0        Gabriel John, NP

## 2023-07-01 NOTE — Assessment & Plan Note (Signed)
 With hospitalizations in March and April 2025. Hospital notes, labs, imaging reviewed.  Continue tube feedings as directed. Follow-up with surgery as scheduled. Referral placed for nutritionist.

## 2023-07-01 NOTE — Assessment & Plan Note (Signed)
 Repeat TSH pending.  She may continue her levothyroxine  75 mcg tablets for now, has mostly been taking 50 mcg. Await results.

## 2023-07-01 NOTE — Assessment & Plan Note (Signed)
 Slowly improving.  Continue ondansetron  tabs as needed.  Discussed use sparingly. Refill provided.

## 2023-07-01 NOTE — Patient Instructions (Addendum)
 Stop by the lab prior to leaving today. I will notify you of your results once received.   You will receive a phone call regarding the nutritionist referral.  Use the Zofran  antinausea medicine only if needed.  It was a pleasure to see you today!

## 2023-07-01 NOTE — Telephone Encounter (Signed)
 Pharmacy Patient Advocate Encounter   Received notification from CoverMyMeds that prior authorization for Ondansetron  4MG  dispersible tablets is required/requested.   PA has been submitted and documented in separate encounter, 04/13/23  Denied 04/13/23 for the reason below

## 2023-07-04 NOTE — Progress Notes (Unsigned)
 PROVIDER NOTE: Interpretation of information contained herein should be left to medically-trained personnel. Specific patient instructions are provided elsewhere under "Patient Instructions" section of medical record. This document was created in part using AI and STT-dictation technology, any transcriptional errors that may result from this process are unintentional.  Patient: Tanya Nguyen  Service: E/M   PCP: Gabriel John, NP  DOB: 10-Dec-1965  DOS: 07/05/2023  Provider: Cherylin Corrigan, NP  MRN: [id number]  Delivery: Face-to-face  Specialty: Interventional Pain Management  Type: Established Patient  Setting: Ambulatory outpatient facility  Specialty designation: 09  Referring Prov.: Gabriel John, NP  Location: Outpatient office facility       HPI  Tanya Nguyen, a 58 y.o. year old female, is here today because of her No primary diagnosis found.. Tanya Nguyen's primary complain today is No chief complaint on file.  Pertinent problems: Tanya Nguyen does not have any pertinent problems on file. Pain Assessment: Severity of   is reported as a  /10. Location:    / . Onset:  . Quality:  . Timing:  . Modifying factor(s):  Aaron Aas Vitals:  vitals were not taken for this visit.  BMI: Estimated body mass index is 14.74 kg/m as calculated from the following:   Height as of 07/01/23: 4\' 11"  (1.499 m).   Weight as of 07/01/23: 73 lb (33.1 kg). Last encounter: Visit date not found. Last procedure: Visit date not found.  Reason for encounter: medication management. No change in medical history since last visit.  Patient's pain is at baseline.  Patient continues multimodal pain regimen as prescribed.  States that it provides pain relief and improvement in functional status.   Pharmacotherapy Assessment  Analgesic: {There is no content from the last Subjective section.}   Monitoring: [place] PMP: PDMP reviewed during this encounter.       Pharmacotherapy: No side-effects or adverse reactions  reported. Compliance: No problems identified. Effectiveness: Clinically acceptable.  No notes on file  No results found for: "CBDTHCR" No results found for: "D8THCCBX" No results found for: "D9THCCBX"  UDS:  Summary  Date Value Ref Range Status  02/02/2023 FINAL  Final    Comment:    ==================================================================== [employer] [facility] 13 (MW) ==================================================================== Test                             Result       Flag       Units  Drug Present and Declared for Prescription Verification   Tramadol                        >5376        EXPECTED   ng/mg creat   O-Desmethyltramadol            >5376        EXPECTED   ng/mg creat   N-Desmethyltramadol            >5376        EXPECTED   ng/mg creat    Source of tramadol  is a prescription medication. O-desmethyltramadol    and N-desmethyltramadol are expected metabolites of tramadol .  ==================================================================== Test                      Result    Flag   Units      Ref Range   Creatinine  93               mg/dL      >=40 ==================================================================== Declared Medications:  The flagging and interpretation on this report are based on the  following declared medications.  Unexpected results may arise from  inaccuracies in the declared medications.   **Note: The testing scope of this panel includes these medications:   Tramadol  (Ultram )   **Note: The testing scope of this panel does not include the  following reported medications:   Aripiprazole  (Abilify )  Duloxetine  (Cymbalta )  Hydroxyzine  (Atarax )  Levothyroxine  (Synthroid )  Oxybutynin  (Ditropan )  Pantoprazole  (Protonix )  Sumatriptan  (Imitrex ) ==================================================================== For clinical consultation, please call [phone]. ====================================================================       ROS  Constitutional: Denies any fever or chills Gastrointestinal: No reported hemesis, hematochezia, vomiting, or acute GI distress Musculoskeletal: Denies any acute onset joint swelling, redness, loss of ROM, or weakness Neurological: No reported episodes of acute onset apraxia, aphasia, dysarthria, agnosia, amnesia, paralysis, loss of coordination, or loss of consciousness  Medication Review  ARIPiprazole , SUMAtriptan , acetaminophen , bisacodyl, diphenhydrAMINE , hydrOXYzine , levothyroxine , mirtazapine , multivitamin with minerals, ondansetron , oxyCODONE , oxybutynin , pantoprazole , and traMADol   History Review  Allergy: Tanya Nguyen is allergic to penicillins, nsaids, gabapentin , pregabalin , and sulfa antibiotics. Drug: Tanya Nguyen  reports no history of drug use. Alcohol:  reports that she does not currently use alcohol. Tobacco:  reports that she has never smoked. She has never been exposed to tobacco smoke. She has never used smokeless tobacco. Social: Tanya Nguyen  reports that she has never smoked. She has never been exposed to tobacco smoke. She has never used smokeless tobacco. She reports that she does not currently use alcohol. She reports that she does not use drugs. Medical:  has a past medical history of Acquired pyloric stricture, Bipolar disorder (HCC), Bleeding duodenal ulcer, CAP (community acquired pneumonia) (02/12/2020), Chronic esophagogastric ulcer, Chronic low back pain, Chronic pain syndrome, Coffee ground emesis (03/29/2023), Constipation (04/02/2014), Depression, Duodenal obstruction, Gastric outlet obstruction (01/19/2018), GERD (gastroesophageal reflux disease), Hair loss (09/22/2018), Head injury (12/24/2022), History of stomach ulcers, Hypothyroidism, IDA (iron  deficiency anemia), Incisional hernia, without obstruction or gangrene, Intractable vomiting, Kidney stones, Melena (05/08/2021),  Migraine, Neuropathy, Nodule of middle lobe of right lung (09/17/2021), NSAID long-term use (09/16/2016), Pre-diabetes, Protein-calorie malnutrition, severe (HCC), PTSD (post-traumatic stress disorder), Right-sided chest pain (09/17/2021), Sacroiliac joint pain (08/14/2018), Side pain (03/30/2018), Small bowel anastomotic dilation, Thrombocytosis, Upper GI bleed (05/18/2021), Vitamin B12 deficiency, and Vitamin D  deficiency. Surgical: Tanya Nguyen  has a past surgical history that includes Abdominal hysterectomy (03/08/2002); Cholecystectomy; Tooth extraction; Esophagogastroduodenoscopy (egd) with propofol  (N/A, 01/12/2018); laparotomy (N/A, 01/20/2018); Esophagogastroduodenoscopy (egd) with propofol  (N/A, 01/25/2018); Colonoscopy with propofol  (N/A, 02/27/2018); Esophagogastroduodenoscopy (egd) with propofol  (N/A, 02/27/2018); Incisional hernia repair (N/A, 11/23/2018); Insertion of mesh (N/A, 11/23/2018); Esophagogastroduodenoscopy (egd) with propofol  (N/A, 05/09/2021); Esophagogastroduodenoscopy (egd) with propofol  (N/A, 03/09/2022); Esophagogastroduodenoscopy (egd) with propofol  (N/A, 03/30/2023); bypass gastrojejunostomy; Lysis of adhesion (N/A, 05/12/2023); Gastrojejunostomy (N/A, 05/12/2023); IR GASTROSTOMY TUBE MOD SED (05/30/2023); IR Replc Duoden/Jejuno Tube Percut W/Fluoro (06/07/2023); IR Replc Gastro/Colonic Tube Percut W/Fluoro (06/17/2023); and IR Replc Duoden/Jejuno Tube Percut W/Fluoro (06/21/2023). Family: family history includes Alcohol abuse in her maternal grandmother and sister; Bipolar disorder in her sister; Breast cancer (age of onset: 55) in her mother; Cancer in her mother; Drug abuse in her sister; Lung cancer in her father; Stroke in her maternal grandmother and paternal grandmother.  Laboratory Chemistry Profile   Renal Lab Results  Component Value Date   BUN 10 06/22/2023  CREATININE 0.53 06/22/2023   BCR 23 02/09/2019   GFR 77.68 03/17/2023   GFRAA 82 02/09/2019   GFRNONAA >60  06/22/2023    Hepatic Lab Results  Component Value Date   AST 30 06/20/2023   ALT 32 06/20/2023   ALBUMIN 2.0 (L) 06/20/2023   ALKPHOS 99 06/20/2023   LIPASE 55 (H) 06/20/2023    Electrolytes Lab Results  Component Value Date   NA 137 06/22/2023   K 3.5 06/22/2023   CL 106 06/22/2023   CALCIUM  7.5 (L) 06/22/2023   MG 1.7 06/22/2023   PHOS 3.9 06/22/2023    Bone Lab Results  Component Value Date   VD25OH 71.09 05/13/2023    Inflammation (CRP: Acute Phase) (ESR: Chronic Phase) No results found for: "CRP", "ESRSEDRATE", "LATICACIDVEN"       Note: Above Lab results reviewed.  Recent Imaging Review  IR Replc Duoden/Jejuno Tube Percut W/Fluoro INDICATION: Malfunctioning indwelling 14 French red rubber jejunostomy catheter. Jejunostomy recently exchanged on 06/07/2023 for occlusion and again on 06/17/2023 for complete dislodgement. The catheter is now partially obstructed and poorly functioning.  EXAM: REPLACEMENT OF JEJUNOSTOMY CATHETER UNDER FLUOROSCOPY  MEDICATIONS: None  ANESTHESIA/SEDATION: Moderate (conscious) sedation was employed during this procedure. A total of Versed  1.0 mg and Fentanyl  100 mcg was administered intravenously.  Moderate Sedation Time: 12 minutes. The patient's level of consciousness and vital signs were monitored continuously by radiology nursing throughout the procedure under my direct supervision.  CONTRAST:  10 mL Omnipaque  300-administered into the gastric lumen.  FLUOROSCOPY TIME:  Fluoroscopy Time: 1 minute and 21 seconds. 1.4 mGy.  COMPLICATIONS: None immediate.  PROCEDURE: Informed written consent was obtained from the patient after a thorough discussion of the procedural risks, benefits and alternatives. All questions were addressed. Maximal Sterile Barrier Technique was utilized including caps, mask, sterile gowns, sterile gloves, sterile drape, hand hygiene and skin antiseptic. A timeout was performed prior to the  initiation of the procedure.  A pre-existing 14 French jejunostomy catheter was injected with contrast material and removed over a guidewire. A new 16 French balloon retention jejunostomy tube was advanced over the wire. The retention balloon was inflated with 5 mL of saline. The catheter was injected with contrast to confirm position and a fluoroscopic image saved.  FINDINGS: The new and slightly larger single-lumen jejunostomy was advanced further into the small bowel compared to the pre-existing jejunostomy. The catheter is ready for immediate use.  IMPRESSION: Exchange and up sizing of feeding jejunostomy catheter under fluoroscopy. A 14 French red rubber catheter was exchanged for a 16 French balloon retention jejunostomy.  Electronically Signed   By: Erica Hau M.D.   On: 06/21/2023 16:07 Note: Reviewed        Physical Exam  General appearance: Well nourished, well developed, and well hydrated. In no apparent acute distress Mental status: Alert, oriented x 3 (person, place, & time)       Respiratory: No evidence of acute respiratory distress Eyes: PERLA Vitals: There were no vitals taken for this visit. BMI: Estimated body mass index is 14.74 kg/m as calculated from the following:   Height as of 07/01/23: 4\' 11"  (1.499 m).   Weight as of 07/01/23: 73 lb (33.1 kg). Ideal: Female patients must weigh at least 45.5 kg to calculate ideal body weight  Assessment   Diagnosis Status  No diagnosis found. Controlled Controlled Controlled   Updated Problems: No problems updated.  Plan of Care  Problem-specific:  Assessment and Plan  Tanya Nguyen has a current medication list which includes the following long-term medication(s): aripiprazole , levothyroxine , mirtazapine , pantoprazole , and sumatriptan .  Pharmacotherapy (Medications Ordered): No orders of the defined types were placed in this encounter.  Orders:  No orders of the defined types were  placed in this encounter.  Follow-up plan:   No follow-ups on file.     {There is no content from the last Plan section.}   Recent Visits No visits were found meeting these conditions. Showing recent visits within past 90 days and meeting all other requirements Future Appointments Date Type Provider Dept  07/05/23 Appointment Nayelly Laughman K, NP [facility]  Showing future appointments within next 90 days and meeting all other requirements  I discussed the assessment and treatment plan with the patient. The patient was provided an opportunity to ask questions and all were answered. The patient agreed with the plan and demonstrated an understanding of the instructions.  Patient advised to call back or seek an in-person evaluation if the symptoms or condition worsens.  Duration of encounter: *** minutes.  Total time on encounter, as per AMA guidelines included both the face-to-face and non-face-to-face time personally spent by the physician and/or other qualified health care professional(s) on the day of the encounter (includes time in activities that require the physician or other qualified health care professional and does not include time in activities normally performed by clinical staff). Physician's time may include the following activities when performed: Preparing to see the patient (e.g., pre-charting review of records, searching for previously ordered imaging, lab work, and nerve conduction tests) Review of prior analgesic pharmacotherapies. Reviewing PMP Interpreting ordered tests (e.g., lab work, imaging, nerve conduction tests) Performing post-procedure evaluations, including interpretation of diagnostic procedures Obtaining and/or reviewing separately obtained history Performing a medically appropriate examination and/or evaluation Counseling and educating the patient/family/caregiver Ordering medications, tests, or procedures Referring and communicating with other  health care professionals (when not separately reported) Documenting clinical information in the electronic or other health record Independently interpreting results (not separately reported) and communicating results to the patient/ family/caregiver Care coordination (not separately reported)  Note by: Kyrah Schiro K Ronya Gilcrest, NP (TTS and AI technology used. I apologize for any typographical errors that were not detected and corrected.) Date: 07/05/2023; Time: 2:28 PM

## 2023-07-05 ENCOUNTER — Encounter: Payer: Self-pay | Admitting: Nurse Practitioner

## 2023-07-05 ENCOUNTER — Ambulatory Visit: Attending: Student in an Organized Health Care Education/Training Program | Admitting: Nurse Practitioner

## 2023-07-05 VITALS — BP 116/74 | Temp 97.9°F | Resp 16 | Ht 59.0 in | Wt 71.0 lb

## 2023-07-05 DIAGNOSIS — G894 Chronic pain syndrome: Secondary | ICD-10-CM | POA: Diagnosis not present

## 2023-07-05 DIAGNOSIS — F4310 Post-traumatic stress disorder, unspecified: Secondary | ICD-10-CM | POA: Diagnosis not present

## 2023-07-05 DIAGNOSIS — M79672 Pain in left foot: Secondary | ICD-10-CM | POA: Diagnosis not present

## 2023-07-05 DIAGNOSIS — M79671 Pain in right foot: Secondary | ICD-10-CM | POA: Insufficient documentation

## 2023-07-05 DIAGNOSIS — G609 Hereditary and idiopathic neuropathy, unspecified: Secondary | ICD-10-CM | POA: Diagnosis not present

## 2023-07-05 DIAGNOSIS — M722 Plantar fascial fibromatosis: Secondary | ICD-10-CM

## 2023-07-05 NOTE — Progress Notes (Signed)
 Nursing Pain Medication Assessment:  Safety precautions to be maintained throughout the outpatient stay will include: orient to surroundings, keep bed in low position, maintain call bell within reach at all times, provide assistance with transfer out of bed and ambulation.  Medication Inspection Compliance: Tanya Nguyen did not comply with our request to bring her pills to be counted. She was reminded that bringing the medication bottles, even when empty, is a requirement.  Medication: None brought in. Pill/Patch Count: None available to be counted. Bottle Appearance: No container available. Did not bring bottle(s) to appointment. Filled Date: N/A Last Medication intake:   Stopped taking Tramadol  about a month ago because she had bowel obstruction in 05/2023 and was given Oxycodone .

## 2023-07-07 ENCOUNTER — Other Ambulatory Visit: Payer: Self-pay

## 2023-07-07 DIAGNOSIS — Z938 Other artificial opening status: Secondary | ICD-10-CM | POA: Diagnosis not present

## 2023-07-07 DIAGNOSIS — Z983 Post therapeutic collapse of lung status: Secondary | ICD-10-CM | POA: Diagnosis not present

## 2023-07-07 NOTE — Patient Instructions (Signed)
 Visit Information  Thank you for taking time to visit with me today. Please don't hesitate to contact me if I can be of assistance to you before our next scheduled appointment.  Our next appointment is by telephone on 07/27/23 at 11 AM Please call the care guide team at [phone] if you need to cancel or reschedule your appointment.   Following is a copy of your care plan:   Goals Addressed             This Visit's Progress    VBCI RN Care Plan (long term)   On track    Problems:  Chronic Disease Management support and education needs related to protein-calorie malnutrition and feeding tube placement  Goal: Over the next 90 days the Patient will experience decrease in ED visits as evidenced by electronic medical record review; ED visits in last 6 months = 2 not experience hospital admission as evidenced by review of electronic medical record. Hospital Admissions in last 6 months = 4  Interventions:   Evaluation of current treatment plan related to  protein-calorie malnutrition and feeding tube placement ,  self-management and patient's adherence to plan as established by provider. Discussed plans with patient for ongoing care management follow up and provided patient with direct contact information for care management team Reviewed scheduled/upcoming provider appointments including surgeon 07/11/23 and pysch 07/14/23 Ensured patient has support at home regarding ADL assistance, management of feeding tube, and emotional support  Patient Self-Care Activities:  Attend all scheduled provider appointments Call provider office for new concerns or questions  Perform all self care activities independently  Take medications as prescribed    Plan:  Telephone follow up appointment with care management team member scheduled for:  07/27/23 at 11 AM with Davina Green          VBCI RN Care Plan (short term)   On track    Problems:  Chronic Disease Management support and education needs  related to protein-calorie malnutrition and feeding tube placement  Goal: Over the next 30 days the Patient will attend all scheduled medical appointments: surgery, psych as evidenced by completed visit notes uploaded to EMR        take all medications exactly as prescribed and will call provider for medication related questions as evidenced by medication adherence    verbalize understanding of plan for management of feeding tube as evidenced by no report of tube clogging, nighttime feedings without complications  Interventions:   Evaluation of current treatment plan related to  protein-calorie malnutrition and feeding tube placement ,  self-management and patient's adherence to plan as established by provider. Discussed plans with patient for ongoing care management follow up and provided patient with direct contact information for care management team Advised patient to discuss advancing diet with surgeon at upcoming appointment Reviewed medications with patient and discussed importance of discussing psych medication alternatives with counselor Reviewed scheduled/upcoming provider appointments including surgeon 07/11/23 and psych 07/14/23  Discussed plans with patient for ongoing care management follow up and provided patient with direct contact information for care management team Screening for signs and symptoms of depression related to chronic disease state   Patient Self-Care Activities:  Attend all scheduled provider appointments Call provider office for new concerns or questions  Perform all self care activities independently  Take medications as prescribed    Plan:  Telephone follow up appointment with care management team member scheduled for:  07/27/23 at 11 AM with Davina Green  Please call the Suicide and Crisis Lifeline: 988 call 1-800-273-TALK (toll free, 24 hour hotline) if you are experiencing a Mental Health or Behavioral Health Crisis or need someone to  talk to.  The patient verbalized understanding of instructions, educational materials, and care plan provided today and DECLINED offer to receive copy of patient instructions, educational materials, and care plan.  Patient is working on Manufacturing systems engineer up.  Theodora Fish, RN MSN   VBCI Population Health RN Care Manager Direct Dial: [phone]  Fax: [phone]

## 2023-07-07 NOTE — Patient Outreach (Signed)
 Complex Care Management   Visit Note  07/07/2023  Name:  Tanya Nguyen MRN: [id number] DOB: 08-Dec-1965  Situation: Referral received for Complex Care Management related to  protein-calorie malnutrition and feeding tube placement.  I obtained verbal consent from Patient.  Visit completed with Jim Motts  on the phone  Background:   Past Medical History:  Diagnosis Date   Acquired pyloric stricture    Bipolar disorder (HCC)    Bleeding duodenal ulcer    CAP (community acquired pneumonia) 02/12/2020   Chronic esophagogastric ulcer    Chronic low back pain    Chronic pain syndrome    Coffee ground emesis 03/29/2023   Constipation 04/02/2014   Depression    Duodenal obstruction    Gastric outlet obstruction 01/19/2018   GERD (gastroesophageal reflux disease)    Hair loss 09/22/2018   Head injury 12/24/2022   History of stomach ulcers    Hypothyroidism    IDA (iron  deficiency anemia)    Incisional hernia, without obstruction or gangrene    Intractable vomiting    Kidney stones    Melena 05/08/2021   Migraine    Neuropathy    Nodule of middle lobe of right lung 09/17/2021   NSAID long-term use 09/16/2016   Pre-diabetes    Protein-calorie malnutrition, severe (HCC)    PTSD (post-traumatic stress disorder)    Right-sided chest pain 09/17/2021   Sacroiliac joint pain 08/14/2018   Side pain 03/30/2018   Small bowel anastomotic dilation    Thrombocytosis    Upper GI bleed 05/18/2021   Vitamin B12 deficiency    Vitamin D  deficiency     Assessment: Patient Reported Symptoms:  Cognitive Cognitive Status: Able to follow simple commands, Alert and oriented to person, place, and time, Normal speech and language skills Cognitive/Intellectual Conditions Management [RPT]: None reported or documented in medical history or problem list      Neurological Neurological Review of Symptoms: No symptoms reported Neurological Conditions: Headache (History of migraines per chart review.  Patient reports she has not required medication recently.) Neurological Management Strategies: Medication therapy (As needed)  HEENT HEENT Symptoms Reported: No symptoms reported HEENT Conditions: Vision problem(s) Vision Problems: blindness/vision loss HEENT Management Strategies: Medical device Vision problem(s)  Cardiovascular Cardiovascular Symptoms Reported: No symptoms reported Does patient have uncontrolled Hypertension?: No    Respiratory Respiratory Symptoms Reported: No symptoms reported    Endocrine Patient reports the following symptoms related to hypoglycemia or hyperglycemia : No symptoms reported Is patient diabetic?: No Endocrine Conditions: Thyroid  disorder Endocrine Management Strategies: Medication therapy  Gastrointestinal Gastrointestinal Symptoms Reported: Diarrhea, Nausea (Patient reports "constant" diarrhea-like bowel movements, especially throughout the night when she is receiving tube feedings. Nausea comes and goes, relieved with PRN medication. No vomiting.) Additional Gastrointestinal Details: Liquid diet Gastrointestinal Conditions: Diarrhea, Reflux/heartburn Gastrointestinal Management Strategies: Nutrition support, Incontinence garment/pad (Feeding tube placed during hospitalization in early March) Enteral Nutrition Time Frame: Cyclic (With flushes every 4 hours) Enteral Nutrition Schedule: 6 pm-4 or 5 am Gastrointestinal Comment: Patinet reports appointment with surgeon 07/11/23. She is hopeful she will be advanced to a soft diet. Nutrition Risk Screen (CP): Tube feeding or parenteral nutrition, Unintentional loss of 10 lbs or more in the past 2 months (Patient reports weighing 71 lbs)  Genitourinary Genitourinary Symptoms Reported: No symptoms reported Genitourinary Conditions: Other Other Genitourinary Conditions: History of urge incontinence noted per chart review, patient reports it has improved and she has not had an issue in "a long while" Genitourinary  Management Strategies: Medication therapy,  Incontinence garment/pad (Oxybutynin  PRN)  Integumentary Integumentary Symptoms Reported: No symptoms reported Additional Integumentary Details: Patient reports that she started to develop a bed sore on her sacrum while in the hospital, but it has healed with frequent position changes and keeping it clean.    Musculoskeletal Musculoskelatal Symptoms Reviewed: No symptoms reported Musculoskeletal Conditions: Back pain, Other (Chronic low back pain per chart review. "Flares up every now and again" per patient.) Other Musculoskeletal Conditions: Neuropathy Musculoskeletal Management Strategies: Exercise, Medication therapy (HH PT once a week) Falls in the past year?: Yes Number of falls in past year: 1 or less Was there an injury with Fall?: No Fall Risk Category Calculator: 1 Patient Fall Risk Level: Low Fall Risk Patient at Risk for Falls Due to: History of fall(s), Medication side effect Fall risk Follow up: Falls evaluation completed, Education provided, Falls prevention discussed  Psychosocial Psychosocial Symptoms Reported: Anxiety - if selected complete GAD, Depression - if selected complete PHQ 2-9 Behavioral Health Conditions: Anxiety, Bipolar affective disorder, Depression, Post traumatic stress Behavioral Management Strategies: Support system, Medication therapy Major Change/Loss/Stressor/Fears (CP): Death of a loved one, Medical condition, self (Recent death of her mother) Techniques to [employer] with Loss/Stress/Change: Medication, Counseling, Spiritual practice(s) Quality of Family Relationships: involved, supportive, helpful Do you feel physically threatened by others?: No      07/07/2023   10:37 AM  Depression screen PHQ 2/9  Decreased Interest 0  Down, Depressed, Hopeless 0  PHQ - 2 Score 0  Altered sleeping 3  Tired, decreased energy 0  Change in appetite 0  Feeling bad or failure about yourself  0  Trouble concentrating 0  Moving  slowly or fidgety/restless 0  Suicidal thoughts 0  PHQ-9 Score 3    There were no vitals filed for this visit.  Medications Reviewed Today     Reviewed by Valaria Garland, RN (Registered Nurse) on 07/07/23 at 1014  Med List Status: <None>   Medication Order Taking? Sig Documenting Provider Last Dose Status Informant  acetaminophen  (TYLENOL ) 500 MG tablet [id number] Yes Take 500-1,000 mg by mouth every 6 (six) hours as needed for moderate pain (pain score 4-6) or headache. [provider] Taking Active Other, Pharmacy Records, Self           Med Note Blondie Burke   WGN May 28, 2023  1:34 PM) prn  ARIPiprazole  (ABILIFY ) 2 MG tablet [id number]  Take 1 tablet (2 mg total) by mouth at bedtime. Eappen, Saramma, MD  Active Other, Pharmacy Records, Self  bisacodyl (DULCOLAX) 5 MG EC tablet [id number]  Take 5 mg by mouth daily as needed for moderate constipation. [provider]  Active Other, Pharmacy Records, Self           Med Note Blondie Burke   HQI May 28, 2023  1:34 PM) prn  diphenhydrAMINE  (BENADRYL ) 25 MG tablet [id number]  Take 25 mg by mouth every 6 (six) hours as needed for allergies. [provider]  Active Other, Pharmacy Records, Self           Med Note Blondie Burke   XLK May 28, 2023  1:34 PM) prn  hydrOXYzine  (ATARAX ) 25 MG tablet [id number]  Take 1 tablet (25 mg total) by mouth daily as needed for anxiety. Todd Fossa, MD  Active Other, Pharmacy Records, Self  levothyroxine  (SYNTHROID ) 50 MCG tablet [id number]  Take 1 tablet by mouth every morning on an empty stomach with water  only.  No food or other medications for  30 minutes. Gabriel John, NP  Active Other, Pharmacy Records, Self  mirtazapine  (REMERON ) 15 MG tablet [id number]  Take 1 tablet (15 mg total) by mouth at bedtime. Eappen, Saramma, MD  Active Other, Pharmacy Records, Self  Multiple Vitamin (MULTIVITAMIN WITH MINERALS) TABS tablet [id number]  Take 1 tablet by mouth  daily. Montey Apa, DO  Active Other, Pharmacy Records, Self  ondansetron  (ZOFRAN -ODT) 4 MG disintegrating tablet [id number] Yes Take 1 tablet (4 mg total) by mouth every 8 (eight) hours as needed for nausea. Gabriel John, NP Taking Active   oxybutynin  (DITROPAN -XL) 5 MG 24 hr tablet [id number]  TAKE 1 TABLET (5 MG TOTAL) BY MOUTH AT BEDTIME. FOR BLADDER INCONTINENCE. Gabriel John, NP  Active Other, Pharmacy Records, Self  oxyCODONE  (ROXICODONE ) 5 MG/5ML solution [id number]  Place 5 mLs (5 mg total) into feeding tube every 6 (six) hours as needed for severe pain (pain score 7-10). Emmalene Hare, MD  Active   pantoprazole  (PROTONIX ) 40 MG tablet [id number]  Take 1 tablet (40 mg total) by mouth daily. Brigitte Canard, PA-C  Active Other, Pharmacy Records, Self           Med Note Felipe Horton, JOYCE   Mon Jun 20, 2023 10:53 PM) prn  SUMAtriptan  (IMITREX ) 50 MG tablet [id number]  Take 1 tablet at migraine onset. May repeat in 2 hours if headache persists or recurs. Gabriel John, NP  Active Other, Pharmacy Records, Self           Med Note Blondie Burke   RJJ May 28, 2023  1:34 PM) prn  traMADol  (ULTRAM ) 50 MG tablet [id number]  Take 1 tablet (50 mg total) by mouth every 12 (twelve) hours as needed. Cephus Collin, MD  Active Other, Pharmacy Records, Self  Med List Note Blondie Burke, CPhT 05/28/23 1336): Patient discharged from [facility] 05/27/23 Pain contract signed 09/17/21 PA for Tramadol  done via [facility]  Key [id number]  kt            Recommendation:   Specialty provider follow-up surgery 07/11/23, psych 07/14/23 Discuss advancing your diet with surgeon at follow-up appointment Discuss alternative medications for mental health with psych at follow-up appointment Continue with regular flushes of your feeding tube to prevent clogging. Contact primary provider or surgeon for issues with feeding tube  Follow Up Plan:   Telephone follow up appointment date/time:  07/27/23 at 11 AM with  Davina Josette Nielsen, RN MSN [place]  [facility] RN Care Manager Direct Dial: [phone]  Fax: [phone]
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
## 2023-07-11 ENCOUNTER — Ambulatory Visit (INDEPENDENT_AMBULATORY_CARE_PROVIDER_SITE_OTHER): Admitting: Surgery

## 2023-07-11 ENCOUNTER — Encounter: Payer: Self-pay | Admitting: Surgery

## 2023-07-11 ENCOUNTER — Other Ambulatory Visit: Payer: Self-pay | Admitting: Primary Care

## 2023-07-11 VITALS — BP 118/81 | HR 118 | Temp 98.5°F | Ht 59.0 in | Wt 74.8 lb

## 2023-07-11 DIAGNOSIS — E038 Other specified hypothyroidism: Secondary | ICD-10-CM

## 2023-07-11 DIAGNOSIS — Z09 Encounter for follow-up examination after completed treatment for conditions other than malignant neoplasm: Secondary | ICD-10-CM

## 2023-07-11 DIAGNOSIS — Z980 Intestinal bypass and anastomosis status: Secondary | ICD-10-CM

## 2023-07-11 DIAGNOSIS — R112 Nausea with vomiting, unspecified: Secondary | ICD-10-CM

## 2023-07-11 DIAGNOSIS — R197 Diarrhea, unspecified: Secondary | ICD-10-CM

## 2023-07-11 DIAGNOSIS — K9413 Enterostomy malfunction: Secondary | ICD-10-CM

## 2023-07-11 NOTE — Progress Notes (Signed)
 07/11/2023  HPI: Tanya Nguyen is a 58 y.o. female s/p exploratory laparotomy, excision of prior loop gastrojejunostomy and creation of new RNY type gastrojejunostomy with a feeding distal jejunostomy.  The patient also required a venting G tube.  She has had issues with the J tube clogging or becoming dislodged and had her last tube replacement on 06/21/23.  She is now flushing her J tube every 4 hrs to keep it patent and has not had any issues with it.  She is tolerating full liquid diet and is venting her G tube but with low output per day.  She continues with her J tube feeds at night time.  On her last admission, her vitamin/mineral labs were much improved.  Her weight today is up a few lbs as well.  She reports she's feeling better overall and is trying hard to continue making progress.  Her appetite has also improved with the use of Remeron .  Vital signs: BP 118/81   Pulse (!) 118   Temp 98.5 F (36.9 C) (Oral)   Ht 4\' 11"  (1.499 m)   Wt 74 lb 12.8 oz (33.9 kg)   SpO2 97%   BMI 15.11 kg/m    Physical Exam: Constitutional: No acute distress Abdomen:  soft, non-distended, non-tender to palpation.  Her midline incision is well healed.  G tube and J tube in place, with no significant surrounding skin irritation.  New dressings applied.  Assessment/Plan: This is a 58 y.o. female s/p exlap, excision of prior loop gastrojejunostomy and creation of new RNY type gastrojejunostomy with feeding jejunostomy.  --The patient continues to make progress.  Despite of some setbacks with clogged or dislodged J tube, she is doing very well after the last J tube placement by IR on 06/21/23.  She is slowly gaining some weight, and her labs are improving as well.  She is tolerating full liquids and J tube feeds at night with some venting of her G tube but overall with low output. --I think it'd be appropriate to start introducing some soft foods to her diet.  Discussed with her that she can slowly start  introducing soft foods and make the transition away from full liquids to soft foods only.  She can continue venting her G tube as needed.  If she feels that the soft diet is causing too much bloatedness or distention, she can go back to full liquids. --Follow up with me in 3 weeks to check on her progress.   [name], MD [facility]

## 2023-07-11 NOTE — Patient Instructions (Addendum)
 OK to start transitioning to soft foods.  Start slowly introducing soft foods to your current full liquid diet, and slowly transition to more soft foods and less of the full liquids as tolerated.  Follow up here in 3 weeks.  Please call and ask to speak with a nurse if you develop questions or concerns.   Soft-Food Eating Plan A soft-food eating plan includes foods that are safe and easy to chew and swallow. Your health care provider or dietitian can help you find foods and flavors that fit into this plan. Follow this plan until your health care provider or dietitian says it is safe to start eating other foods and food textures. What are tips for following this plan? Cooking Cook meats so they stay tender and moist. Use methods like braising, stewing, or baking in liquid. Cook vegetables and fruit until they are soft enough to be mashed with a fork. Peel soft, fresh fruits such as peaches, nectarines, and melons. When making soup, make sure chunks of meat and vegetables are smaller than  inch. Reheat leftover foods slowly so that a tough crust does not form. General information  Take small bites of food or cut food into pieces about  inch or smaller. Bite-sized pieces of food are easier to chew and swallow. Eat moist foods. Avoid overly dry foods. Avoid foods that: Are difficult to swallow, such as dry, chunky, crispy, or sticky foods. Are difficult to chew, such as hard, tough, or stringy foods. Contain nuts, seeds, or many types of uncooked fruit. Follow instructions from your dietitian about the types of liquids that are safe for you to swallow. You may be allowed to have: Thick liquids only. This includes only liquids that are thicker than honey. Thin and thick liquids. This includes all beverages and foods that become liquid at room temperature. To make thick liquids: Purchase a commercial liquid thickening powder. These are available at grocery stores and pharmacies. Mix the  thickener into liquids according to instructions on the label. Purchase ready-made thickened liquids. Thicken soup by pureeing, straining to remove chunks, and adding flour, potato flakes, or corn starch. Add commercial thickener to foods that become liquid at room temperature, such as milk shakes, yogurt, ice cream, gelatin, and sherbet. Ask your health care provider whether you need to take a fiber supplement. What foods should I eat? Fruits All canned and cooked fruits. Soft, peeled fresh fruits. Strawberries. Vegetables All soft-cooked vegetables. Shredded lettuce. Grains Breads, muffins, pancakes, or waffles moistened with syrup, jelly, or butter. Dry cereals well-moistened with milk. Moist, cooked cereals. Well-cooked pasta and rice. Meats and other proteins Tender, moist ground meat, poultry, or fish. Meat cooked in gravy or sauces. Eggs. Dairy Milk. Cream. Yogurt. Cottage cheese. Soft cheese without the rind. Sweets and desserts Ice cream. Milk shakes. Sherbet. Pudding. Fats and oils Butter. Margarine. Olive, canola, sunflower, and grapeseed oil. Smooth salad dressing. Smooth cream cheese. Mayonnaise. Gravy. The items listed above may not be a complete list of foods and beverages you can eat. Contact a dietitian for more information. What foods should I avoid? Fruits Fresh fruits with skins or seeds, or both, such as apples, pears, and grapes. Stringy, high-pulp fruits, such as papaya, pineapple, coconut, and mango. Fruit leather and all dried fruit. Vegetables All raw vegetables. Cooked corn. Cooked vegetables that are tough or stringy. Tough, crisp, fried potatoes and potato skins. Grains Coarse or dry cereals, such as bran, granola, and shredded wheat. Tough or chewy crusty breads, such as  French bread or baguettes. Breads with nuts, seeds, or fruit. Meats and other proteins Hard, dry sausages. Dry meat, poultry, or fish. Meats with gristle. Fish with bones. Fried meat or  fish. Lunch meat and hotdogs. Nuts and seeds. Chunky peanut butter or other nut butters. Dairy Yogurt with nuts or coconut. Sweets and desserts Cakes or cookies that are very dry or chewy. Desserts with dried fruit, nuts, or coconut. Fried pastries. Very rich pastries. Fats and oils Cream cheese with fruit or nuts. Salad dressings with seeds or chunks. The items listed above may not be a complete list of foods and beverages you should avoid. Contact a dietitian for more information. Summary A soft-food eating plan includes foods that are safe and easy to swallow. Generally, the foods should be soft enough to be mashed with a fork. Avoid foods that are dry, hard to chew, crunchy, sticky, stringy, or crispy. Ask your health care provider whether you need to thicken your liquids and if you need to take a fiber supplement. This information is not intended to replace advice given to you by your health care provider. Make sure you discuss any questions you have with your health care provider. Document Revised: 01/24/2020 Document Reviewed: 01/25/2020 Elsevier Patient Education  2024 [employer].

## 2023-07-14 ENCOUNTER — Encounter: Payer: Self-pay | Admitting: Psychiatry

## 2023-07-14 ENCOUNTER — Ambulatory Visit (INDEPENDENT_AMBULATORY_CARE_PROVIDER_SITE_OTHER): Admitting: Psychiatry

## 2023-07-14 ENCOUNTER — Other Ambulatory Visit: Payer: Self-pay

## 2023-07-14 VITALS — BP 110/74 | HR 120 | Temp 96.2°F | Ht 59.0 in | Wt 75.4 lb

## 2023-07-14 DIAGNOSIS — G4700 Insomnia, unspecified: Secondary | ICD-10-CM

## 2023-07-14 DIAGNOSIS — F3341 Major depressive disorder, recurrent, in partial remission: Secondary | ICD-10-CM | POA: Diagnosis not present

## 2023-07-14 DIAGNOSIS — F4310 Post-traumatic stress disorder, unspecified: Secondary | ICD-10-CM

## 2023-07-14 NOTE — Patient Instructions (Signed)
 Continue mirtazapine  15 mg at night  Continue Abilify  2 mg at night   Hold hydroxyzine  Next appointment: 7/14 at 1 PM

## 2023-07-18 ENCOUNTER — Other Ambulatory Visit (INDEPENDENT_AMBULATORY_CARE_PROVIDER_SITE_OTHER)

## 2023-07-18 ENCOUNTER — Other Ambulatory Visit: Payer: Self-pay | Admitting: Primary Care

## 2023-07-18 DIAGNOSIS — E038 Other specified hypothyroidism: Secondary | ICD-10-CM

## 2023-07-18 LAB — TSH: TSH: 15.52 u[IU]/mL — ABNORMAL HIGH (ref 0.35–5.50)

## 2023-07-18 MED ORDER — LEVOTHYROXINE SODIUM 75 MCG PO TABS: ORAL_TABLET | ORAL | 0 refills | Status: DC

## 2023-07-19 ENCOUNTER — Ambulatory Visit: Payer: Self-pay

## 2023-07-21 ENCOUNTER — Ambulatory Visit: Admitting: Psychiatry

## 2023-07-24 DIAGNOSIS — Z934 Other artificial openings of gastrointestinal tract status: Secondary | ICD-10-CM | POA: Diagnosis not present

## 2023-07-24 DIAGNOSIS — K315 Obstruction of duodenum: Secondary | ICD-10-CM | POA: Diagnosis not present

## 2023-07-24 DIAGNOSIS — E43 Unspecified severe protein-calorie malnutrition: Secondary | ICD-10-CM | POA: Diagnosis not present

## 2023-07-27 ENCOUNTER — Other Ambulatory Visit: Payer: Self-pay

## 2023-07-27 DIAGNOSIS — K315 Obstruction of duodenum: Secondary | ICD-10-CM | POA: Diagnosis not present

## 2023-07-27 NOTE — Patient Outreach (Signed)
 Complex Care Management   Visit Note  07/27/2023  Name:  Tanya Nguyen MRN: [id number] DOB: Jul 09, 1965  Situation: Referral received for Complex Care Management related to health conditions I obtained verbal consent from Patient.  Visit completed with patient  on the phone  Background:   Past Medical History:  Diagnosis Date   Acquired pyloric stricture    Bipolar disorder (HCC)    Bleeding duodenal ulcer    CAP (community acquired pneumonia) 02/12/2020   Chronic esophagogastric ulcer    Chronic low back pain    Chronic pain syndrome    Coffee ground emesis 03/29/2023   Constipation 04/02/2014   Depression    Duodenal obstruction    Gastric outlet obstruction 01/19/2018   GERD (gastroesophageal reflux disease)    Hair loss 09/22/2018   Head injury 12/24/2022   History of stomach ulcers    Hypothyroidism    IDA (iron  deficiency anemia)    Incisional hernia, without obstruction or gangrene    Intractable vomiting    Kidney stones    Melena 05/08/2021   Migraine    Neuropathy    Nodule of middle lobe of right lung 09/17/2021   NSAID long-term use 09/16/2016   Pre-diabetes    Protein-calorie malnutrition, severe (HCC)    PTSD (post-traumatic stress disorder)    Right-sided chest pain 09/17/2021   Sacroiliac joint pain 08/14/2018   Side pain 03/30/2018   Small bowel anastomotic dilation    Thrombocytosis    Upper GI bleed 05/18/2021   Vitamin B12 deficiency    Vitamin D  deficiency     Assessment: Patient Reported Symptoms:  Cognitive Cognitive Status: Alert and oriented to person, place, and time, Insightful and able to interpret abstract concepts, Normal speech and language skills      Neurological Neurological Review of Symptoms: Not assessed    HEENT HEENT Symptoms Reported: Not assessed      Cardiovascular Cardiovascular Symptoms Reported: Not assessed Weight: 78 lb 6.4 oz (35.6 kg)  Respiratory Respiratory Symptoms Reported: Not assesed    Endocrine  Patient reports the following symptoms related to hypoglycemia or hyperglycemia : Not assessed    Gastrointestinal Gastrointestinal Symptoms Reported: Diarrhea, Nausea Additional Gastrointestinal Details: Patient reports diarrhea  and nausea have gotten better.  She states she is eating a soft diet now and receives tube feeding at night. Gastrointestinal Conditions: Diarrhea Gastrointestinal Management Strategies: Nutrition support, Incontinence garment/pad Enteral Nutrition Time Frame: Cyclic Enteral Nutrition Schedule: 6 pm to 4 or 5 am. Parenteral Nutrition Method of Administration: Cyclic (flushes every 4 hours.) Gastrointestinal Comment: Patient states overall she is doing much better. She states her appetite has increase therefore she is eating alot better and eating more. She states she feels the mirtarzapine is helping.,  She denies any abdominal pain or discomfort. Patient states she is completely off of the oxycodone  for pain and only takes the tramadol  on occasiona.  She states she is scheduled to follow up with the surgeon on 08/03/23. Patient states her primary provider referred her to a nutritionist however she is unable to afford the visits.  She states she may not need it at this point because she is doing much better and eating better. Reports today's weight 78.4 lbs up from 73lbs. Nutrition Risk Screen (CP): Tube feeding or parenteral nutrition, Unintentional loss of 10 lbs or more in the past 2 months  Genitourinary Genitourinary Symptoms Reported: Not assessed    Integumentary Integumentary Symptoms Reported: No symptoms reported Additional Integumentary Details: patient reports bed  sore area to sacrum has healed. She states she continues to apply cream to the area as prevention.  Patient states she is up moving around more. Reports exercising every day by walking and doing chair exercises for 15-30 minutes    Musculoskeletal Musculoskelatal Symptoms Reviewed: No symptoms  reported        Psychosocial Psychosocial Symptoms Reported: No symptoms reported Additional Psychological Details: Patient states her depression is being managed well. Reports ongoing follow up with psychiatrist. Patient states she is sleeping better at night. Behavioral Health Conditions: Anxiety, Bipolar affective disorder, Depression, Post traumatic stress Behavioral Management Strategies: Medication therapy, Support system (routine follow up with psychiatrist.)          07/07/2023   10:37 AM  Depression screen PHQ 2/9  Decreased Interest 0  Down, Depressed, Hopeless 0  PHQ - 2 Score 0  Altered sleeping 3  Tired, decreased energy 0  Change in appetite 0  Feeling bad or failure about yourself  0  Trouble concentrating 0  Moving slowly or fidgety/restless 0  Suicidal thoughts 0  PHQ-9 Score 3    There were no vitals filed for this visit.  Medications Reviewed Today     Reviewed by [name] E, RN (Registered Nurse) on 07/27/23 at 1128  Med List Status: <None>   Medication Order Taking? Sig Documenting Provider Last Dose Status Informant  acetaminophen  (TYLENOL ) 500 MG tablet [id number] Yes Take 500-1,000 mg by mouth every 6 (six) hours as needed for moderate pain (pain score 4-6) or headache. [provider] Taking Active Other, Pharmacy Records, Self           Med Note Blondie Burke   NWG May 28, 2023  1:34 PM) prn  ARIPiprazole  (ABILIFY ) 2 MG tablet [id number] Yes Take 1 tablet (2 mg total) by mouth at bedtime. Eappen, Saramma, MD Taking Active Other, Pharmacy Records, Self  bisacodyl (DULCOLAX) 5 MG EC tablet [id number] Yes Take 5 mg by mouth daily as needed for moderate constipation. [provider] Taking Active Other, Pharmacy Records, Self           Med Note Blondie Burke   BMW May 28, 2023  1:34 PM) prn  diphenhydrAMINE  (BENADRYL ) 25 MG tablet [id number] Yes Take 25 mg by mouth every 6 (six) hours as needed for allergies. [provider] Taking Active Other, Pharmacy Records, Self           Med Note Blondie Burke   UVO May 28, 2023  1:34 PM) prn  levothyroxine  (SYNTHROID ) 75 MCG tablet [id number] Yes Take 1 tablet by mouth every morning on an empty stomach with water  only.  No food or other medications for 30 minutes. Gabriel John, NP Taking Active   mirtazapine  (REMERON ) 15 MG tablet [id number] Yes Take 1 tablet (15 mg total) by mouth at bedtime. Eappen, Saramma, MD Taking Active Other, Pharmacy Records, Self           Med Note Marrie Sizer, Ellinor Test E   Wed Jul 27, 2023 11:27 AM) Patient states she is tolerating medication well.   Multiple Vitamin (MULTIVITAMIN WITH MINERALS) TABS tablet [id number] Yes Take 1 tablet by mouth daily. Montey Apa, DO Taking Active Other, Pharmacy Records, Self  ondansetron  (ZOFRAN -ODT) 4 MG disintegrating tablet [id number] Yes Take 1 tablet (4 mg total) by mouth every 8 (eight) hours as needed for nausea. Gabriel John, NP Taking Active   oxybutynin  (DITROPAN -XL) 5 MG 24 hr tablet [id number] Yes TAKE 1  TABLET (5 MG TOTAL) BY MOUTH AT BEDTIME. FOR BLADDER INCONTINENCE. Gabriel John, NP Taking Active Other, Pharmacy Records, Self  oxyCODONE  (ROXICODONE ) 5 MG/5ML solution [id number] No Place 5 mLs (5 mg total) into feeding tube every 6 (six) hours as needed for severe pain (pain score 7-10).  Patient not taking: Reported on 07/27/2023   Emmalene Hare, MD Not Taking Active   pantoprazole  (PROTONIX ) 40 MG tablet [id number] Yes Take 1 tablet (40 mg total) by mouth daily. Brigitte Canard, PA-C Taking Active Other, Pharmacy Records, Self           Med Note (STEFFENS, MICHELLE P   Thu Jul 07, 2023 10:21 AM)    SUMAtriptan  (IMITREX ) 50 MG tablet [id number] Yes Take 1 tablet at migraine onset. May repeat in 2 hours if headache persists or recurs. Gabriel John, NP Taking Active Other, Pharmacy Records, Self           Med Note Blondie Burke   GEX May 28, 2023  1:34 PM) prn   traMADol  (ULTRAM ) 50 MG tablet [id number] Yes Take 1 tablet (50 mg total) by mouth every 12 (twelve) hours as needed. Cephus Collin, MD Taking Active Other, Pharmacy Records, Self           Med Note (STEFFENS, MICHELLE P   Thu Jul 07, 2023 10:21 AM) Patient reports on hold until she is completely off of oxycodone . She has f/u pain management in 1 month.  Med List Note Blondie Burke, CPhT 05/28/23 1336): Patient discharged from [facility] 05/27/23 Pain contract signed 09/17/21 PA for Tramadol  done via [facility]  Key [id number]  kt            Recommendation:   PCP Follow-up  Follow Up Plan:   Telephone follow-up in 1 month. Patient informed she will be transferred to Michele Ahle, RN case manager for ongoing follow up.  Patient agreeable to transfer and follow up.  Verba Girt RN, BSN, CCM [employer], Population Health Case Manager Phone: [phone]

## 2023-07-27 NOTE — Patient Instructions (Signed)
 Visit Information  Thank you for taking time to visit with me today. Please don't hesitate to contact me if I can be of assistance to you before our next scheduled appointment.  Your next care management appointment is by telephone on 09/01/23 at 11 am  Telephone follow-up in 1 month with Michele Ahle, RN Case manager.  Please call the care guide team at [phone] if you need to cancel, schedule, or reschedule an appointment.   Please call the Suicide and Crisis Lifeline: 988 call 1-800-273-TALK (toll free, 24 hour hotline) if you are experiencing a Mental Health or Behavioral Health Crisis or need someone to talk to.  Verba Girt RN, BSN, CCM [employer], Population Health Case Manager Phone: [phone]

## 2023-07-28 ENCOUNTER — Encounter: Admitting: Nurse Practitioner

## 2023-07-29 ENCOUNTER — Other Ambulatory Visit: Payer: Self-pay | Admitting: Primary Care

## 2023-07-29 DIAGNOSIS — E038 Other specified hypothyroidism: Secondary | ICD-10-CM

## 2023-08-01 ENCOUNTER — Other Ambulatory Visit: Payer: Self-pay | Admitting: Psychiatry

## 2023-08-03 ENCOUNTER — Ambulatory Visit (INDEPENDENT_AMBULATORY_CARE_PROVIDER_SITE_OTHER): Admitting: Surgery

## 2023-08-03 ENCOUNTER — Encounter: Payer: Self-pay | Admitting: Surgery

## 2023-08-03 VITALS — BP 101/68 | HR 109 | Temp 98.1°F | Ht 59.0 in | Wt 84.0 lb

## 2023-08-03 DIAGNOSIS — Z980 Intestinal bypass and anastomosis status: Secondary | ICD-10-CM

## 2023-08-03 NOTE — Progress Notes (Signed)
 08/03/2023  HPI: Tanya Nguyen is a 58 y.o. female s/p exploratory laparotomy, excision of prior loop gastrojejunostomy and creation of new RNY type gastrojejunostomy with a feeding distal jejunostomy.  The patient also required a venting G tube.  She had issues with the J tube clogging or becoming dislodged and had her last tube replacement on 06/21/23.  She is now flushing her J tube every 4 hrs to keep it patent and has not had any issues with it.  On her last visit on 07/11/23, she was transitioned to soft foods for po intake.  She continues with her J tube feeds at night time.  Her weight today continues to improve and is at 84 lbs.  She reports a good appetite, with about 2 episodes per week of nausea, at which time she vents her G tube.  She reports gas in the bag during those times.  She also reports some tenderness with the tube sites themselves but no worsening pain.  Vital signs: BP 101/68   Pulse (!) 109   Temp 98.1 F (36.7 C) (Oral)   Ht 4\' 11"  (1.499 m)   Wt 84 lb (38.1 kg)   SpO2 97%   BMI 16.97 kg/m    Physical Exam: Constitutional: No acute distress Abdomen:  soft, non-distended, non-tender to palpation.  Her midline incision is well healed.  G tube and J tube in place, with only some slight skin/subcutaneous tissue protruding.  New gauze dressing applied.  Assessment/Plan: This is a 58 y.o. female s/p exlap, excision of prior loop gastrojejunostomy and creation of new RNY type gastrojejunostomy with feeding jejunostomy.  --The patient continues to make progress.  Her weight is improving, and she's tolerating soft foods.  I think it's appropriate to try some regular consistency foods now, knowing that she can always fall back to soft foods if she's not tolerating well.  Continue with night time tube feeds. --Discussed with her that I would like her BMI to continue to improve and reach normal range before stopping her tube feeds.  Will need to have some time without any tube feeds  to make sure her weight is staying stable before pulling out her J tube.  The G tube can continue for venting as needed for now.  Could also consider another SBFT prior to removing tubes to make sure her bowels are not distended like before.   [name], MD [facility]

## 2023-08-03 NOTE — Patient Instructions (Signed)
 How to Care for a Feeding Tube A feeding tube is a soft, flexible tube used to give medicine, water, and liquid food. A person may need a feeding tube if they have trouble swallowing or cannot have food or medicine by mouth. The tube is put right into the stomach.  The following information gives steps on how to care for the skin around a feeding tube, or the tube site. This information is meant for adults and children over 59 year of age. Supplies needed: Clean washcloth, gauze pads, or soft paper towels. Cotton swabs. Skin barrier ointment or cream, such as petroleum jelly. Soap and water, or sterile saline. Pre-cut foam pads or gauze for around the tube. Medical tape. Anchoring device. This is not always used. Syringe. Cleaning brush or toothbrush. This is only used for cleanings. How to care for the tube site  Have all supplies ready and near you. Wash your hands with soap and water for at least 20 seconds. Remove the foam pad or gauze under the tube stabilizing disc (bumper), if there is one. You may have to do this a few times a day right after the feeding tube is put in because the gauze or pad will get soiled or wet. As the site heals, you may not have to replace the pads or gauze as often. But you still need to clean and check the area every day. Gently turn the bumper so it does not stick to the skin. Check the skin around the tube site for redness, rash, swelling, drainage, or growth of extra tissue. Check the number on the tube (guide mark) where it meets the skin. It should not change. If it does, the feeding tube might be coming out. Moisten gauze pads and cotton swabs with water and soap, or saline. Take the moistenedcotton swab and wipe under the bumper, right near the opening in the belly (stoma). Take the moistened gauze pad and clean the skin around the tube site. If you used soap, rinse with water. Use a washcloth, dry gauze pad, or soft paper towel to dry the skin and  stoma site. Dry the tube and bumper too. If the skin is red, put a barrier cream or ointment on a cotton swab. Apply it around the site, under the bumper. This will help the site heal. Put a new pre-cut foam pad or gauze around the tube, under the bumper. If the site is healed and there is no drainage, you can leave off the foam pads or gauze. Put tape around the edges of the foam pad or gauze to keep it in place. Use tape or an anchoring device to hold the loose end of the tube against the skin. This helps keep the tube from getting pulled on. Change where you put the tape to avoid damaging the skin. Throw away used supplies. Wash your hands with soap and water for at least 20 seconds. How to care for the moat If the end of the feeding tube has a moat, clean it once a day or as told. The moat is the open space inside the feeding tube connector. Follow the manufacturer's instructions on how to clean the moat. You may be told to: Remove the cap from the end of the feeding tube. Cover the hole in the middle of the tube port with a cleaning brush. Hold the end of the tube over a deep bowl, or wrap it with paper towels or a washcloth to soak up the water. Use  a syringe of water to flush out the moat. Use the cleaning brush or toothbrush to clean the tube cap and around the inside of the moat. This brush can only be used for tube cleanings. Dry the end of the feeding tube and the cap with gauze or paper towel. Put the cap back on the feeding tube. General tips Use, clean, and reuse feeding tube equipment only as told by the health care provider. Do not use antibiotic ointment or cream on the feeding tube site unless you're told to. Contact a health care provider if: You notice a change in the guide marks on the feeding tube where it meets the skin. Changes could mean the feeding tube has moved or is coming out. You see any of these on the skin around the tube  site: Redness. Rash. Swelling. Drainage. Extra growth of tissue. You have questions or concerns about the feeding tube or the tube site. This information is not intended to replace advice given to you by your health care provider. Make sure you discuss any questions you have with your health care provider. Document Revised: 06/24/2022 Document Reviewed: 06/24/2022 Elsevier Patient Education  2024 [employer].

## 2023-08-15 ENCOUNTER — Other Ambulatory Visit

## 2023-08-16 ENCOUNTER — Other Ambulatory Visit: Payer: Self-pay | Admitting: Primary Care

## 2023-08-16 DIAGNOSIS — E038 Other specified hypothyroidism: Secondary | ICD-10-CM

## 2023-08-22 DIAGNOSIS — E43 Unspecified severe protein-calorie malnutrition: Secondary | ICD-10-CM | POA: Diagnosis not present

## 2023-08-22 DIAGNOSIS — Z934 Other artificial openings of gastrointestinal tract status: Secondary | ICD-10-CM | POA: Diagnosis not present

## 2023-08-22 DIAGNOSIS — K315 Obstruction of duodenum: Secondary | ICD-10-CM | POA: Diagnosis not present

## 2023-08-27 DIAGNOSIS — K315 Obstruction of duodenum: Secondary | ICD-10-CM | POA: Diagnosis not present

## 2023-08-29 ENCOUNTER — Encounter: Payer: Self-pay | Admitting: Nurse Practitioner

## 2023-08-29 ENCOUNTER — Ambulatory Visit: Attending: Nurse Practitioner | Admitting: Nurse Practitioner

## 2023-08-29 ENCOUNTER — Other Ambulatory Visit

## 2023-08-29 VITALS — BP 106/73 | HR 100 | Temp 97.1°F | Resp 16 | Ht 59.0 in | Wt 86.4 lb

## 2023-08-29 DIAGNOSIS — G609 Hereditary and idiopathic neuropathy, unspecified: Secondary | ICD-10-CM | POA: Diagnosis not present

## 2023-08-29 DIAGNOSIS — M722 Plantar fascial fibromatosis: Secondary | ICD-10-CM | POA: Insufficient documentation

## 2023-08-29 DIAGNOSIS — F4310 Post-traumatic stress disorder, unspecified: Secondary | ICD-10-CM | POA: Diagnosis not present

## 2023-08-29 DIAGNOSIS — M79672 Pain in left foot: Secondary | ICD-10-CM | POA: Insufficient documentation

## 2023-08-29 DIAGNOSIS — M79671 Pain in right foot: Secondary | ICD-10-CM | POA: Diagnosis not present

## 2023-08-29 DIAGNOSIS — G894 Chronic pain syndrome: Secondary | ICD-10-CM | POA: Insufficient documentation

## 2023-08-29 MED ORDER — TRAMADOL HCL 50 MG PO TABS: 50.0000 mg | ORAL_TABLET | Freq: Two times a day (BID) | ORAL | 2 refills | Status: AC | PRN

## 2023-08-29 NOTE — Progress Notes (Signed)
 Nursing Pain Medication Assessment:  Safety precautions to be maintained throughout the outpatient stay will include: orient to surroundings, keep bed in low position, maintain call bell within reach at all times, provide assistance with transfer out of bed and ambulation.  Medication Inspection Compliance: Pill count conducted under aseptic conditions, in front of the patient. Neither the pills nor the bottle was removed from the patient's sight at any time. Once count was completed pills were immediately returned to the patient in their original bottle.  Medication: Tramadol  (Ultram ) Pill/Patch Count: 14 of 14 pills/patches remain Pill/Patch Appearance: Markings consistent with prescribed medication Bottle Appearance: Standard pharmacy container. Clearly labeled. Filled Date: 05 / 25 / 2025 Last Medication intake:  not having to take as much

## 2023-08-29 NOTE — Progress Notes (Signed)
 PROVIDER NOTE: Interpretation of information contained herein should be left to medically-trained personnel. Specific patient instructions are provided elsewhere under Patient Instructions section of medical record. This document was created in part using AI and STT-dictation technology, any transcriptional errors that may result from this process are unintentional.  Patient: Tanya Nguyen  Service: E/M   PCP: Gretta Comer POUR, NP  DOB: 1966-03-05  DOS: 08/29/2023  Provider: Emmy POUR Blanch, NP  MRN: [id number]  Delivery: Face-to-face  Specialty: Interventional Pain Management  Type: Established Patient  Setting: Ambulatory outpatient facility  Specialty designation: 09  Referring Prov.: Gretta Comer POUR, NP  Location: Outpatient office facility       History of present illness (HPI) [name], a 58 y.o. year old female, is here today because of her Chronic pain syndrome [G89.4]. Ms. Mckamey primary complain today is Foot Pain (Bilateral )   Pain Assessment: Severity of Chronic pain is reported as a 0-No pain/10. Location: Foot Left, Right/denies. Onset: More than a month ago. Quality: Aching, Burning, Throbbing. Timing: Constant. Modifying factor(s): medications. Vitals:  height is 4' 11 (1.499 m) and weight is 86 lb 6.4 oz (39.2 kg). Her temporal temperature is 97.1 F (36.2 C) (abnormal). Her blood pressure is 106/73 and her pulse is 100. Her respiration is 16 and oxygen saturation is 99%.  BMI: Estimated body mass index is 17.45 kg/m as calculated from the following:   Height as of this encounter: 4' 11 (1.499 m).   Weight as of this encounter: 86 lb 6.4 oz (39.2 kg).  Last encounter: 07/05/2023. Last procedure: 07/28/2022  Reason for encounter: medication management.  No change in medical history since last visit.  Patient's pain is at baseline.  Patient continues multimodal pain regimen as prescribed.  States that it provides pain relief and improvement in functional status.  The  patient has a history of bowel obstruction and currently has a jejunostomy.  Due to impaired GI function, she is taking oral liquid oxycodone  for pain management.    Pharmacotherapy Assessment   Analgesic: Tramadol  (Ultram ) 50 mg every 12 hours as needed for pain. MME=20 Monitoring: [place] PMP: PDMP reviewed during this encounter.       Pharmacotherapy: No side-effects or adverse reactions reported. Compliance: No problems identified. Effectiveness: Clinically acceptable.  Jakie Chrissie MATSU, RN  08/29/2023 11:26 AM  Sign when Signing Visit Nursing Pain Medication Assessment:  Safety precautions to be maintained throughout the outpatient stay will include: orient to surroundings, keep bed in low position, maintain call bell within reach at all times, provide assistance with transfer out of bed and ambulation.  Medication Inspection Compliance: Pill count conducted under aseptic conditions, in front of the patient. Neither the pills nor the bottle was removed from the patient's sight at any time. Once count was completed pills were immediately returned to the patient in their original bottle.  Medication: Tramadol  (Ultram ) Pill/Patch Count: 14 of 14 pills/patches remain Pill/Patch Appearance: Markings consistent with prescribed medication Bottle Appearance: Standard pharmacy container. Clearly labeled. Filled Date: 05 / 25 / 2025 Last Medication intake:  not having to take as much     UDS:  Summary  Date Value Ref Range Status  02/02/2023 FINAL  Final    Comment:    ==================================================================== [employer] [facility] 13 (MW) ==================================================================== Test                             Result  Flag       Units  Drug Present and Declared for Prescription Verification   Tramadol                        >5376        EXPECTED   ng/mg creat   O-Desmethyltramadol            >5376        EXPECTED   ng/mg creat    N-Desmethyltramadol            >5376        EXPECTED   ng/mg creat    Source of tramadol  is a prescription medication. O-desmethyltramadol    and N-desmethyltramadol are expected metabolites of tramadol .  ==================================================================== Test                      Result    Flag   Units      Ref Range   Creatinine              93               mg/dL      >=79 ==================================================================== Declared Medications:  The flagging and interpretation on this report are based on the  following declared medications.  Unexpected results may arise from  inaccuracies in the declared medications.   **Note: The testing scope of this panel includes these medications:   Tramadol  (Ultram )   **Note: The testing scope of this panel does not include the  following reported medications:   Aripiprazole  (Abilify )  Duloxetine  (Cymbalta )  Hydroxyzine  (Atarax )  Levothyroxine  (Synthroid )  Oxybutynin  (Ditropan )  Pantoprazole  (Protonix )  Sumatriptan  (Imitrex ) ==================================================================== For clinical consultation, please call [phone]. ====================================================================     No results found for: CBDTHCR No results found for: D8THCCBX No results found for: D9THCCBX  ROS  Constitutional: Denies any fever or chills Gastrointestinal: No reported hemesis, hematochezia, vomiting, or acute GI distress Musculoskeletal: Bilateral foot pain Neurological: No reported episodes of acute onset apraxia, aphasia, dysarthria, agnosia, amnesia, paralysis, loss of coordination, or loss of consciousness  Medication Review  ARIPiprazole , SUMAtriptan , acetaminophen , bisacodyl, diphenhydrAMINE , levothyroxine , mirtazapine , multivitamin with minerals, ondansetron , oxybutynin , pantoprazole , and traMADol   History Review  Allergy: Ms. Carachure is allergic to penicillins,  nsaids, gabapentin , pregabalin , and sulfa antibiotics. Drug: Ms. Freshour  reports no history of drug use. Alcohol:  reports that she does not currently use alcohol. Tobacco:  reports that she has never smoked. She has never been exposed to tobacco smoke. She has never used smokeless tobacco. Social: Ms. Pennywell  reports that she has never smoked. She has never been exposed to tobacco smoke. She has never used smokeless tobacco. She reports that she does not currently use alcohol. She reports that she does not use drugs. Medical:  has a past medical history of Acquired pyloric stricture, Bipolar disorder (HCC), Bleeding duodenal ulcer, CAP (community acquired pneumonia) (02/12/2020), Chronic esophagogastric ulcer, Chronic low back pain, Chronic pain syndrome, Coffee ground emesis (03/29/2023), Constipation (04/02/2014), Depression, Duodenal obstruction, Gastric outlet obstruction (01/19/2018), GERD (gastroesophageal reflux disease), Hair loss (09/22/2018), Head injury (12/24/2022), History of stomach ulcers, Hypothyroidism, IDA (iron  deficiency anemia), Incisional hernia, without obstruction or gangrene, Intractable vomiting, Kidney stones, Melena (05/08/2021), Migraine, Neuropathy, Nodule of middle lobe of right lung (09/17/2021), NSAID long-term use (09/16/2016), Pre-diabetes, Protein-calorie malnutrition, severe (HCC), PTSD (post-traumatic stress disorder), Right-sided chest pain (09/17/2021), Sacroiliac joint pain (08/14/2018), Side pain (03/30/2018), Small bowel anastomotic dilation, Thrombocytosis, Upper  GI bleed (05/18/2021), Vitamin B12 deficiency, and Vitamin D  deficiency. Surgical: Ms. Geerdes  has a past surgical history that includes Abdominal hysterectomy (03/08/2002); Cholecystectomy; Tooth extraction; Esophagogastroduodenoscopy (egd) with propofol  (N/A, 01/12/2018); laparotomy (N/A, 01/20/2018); Esophagogastroduodenoscopy (egd) with propofol  (N/A, 01/25/2018); Colonoscopy with propofol  (N/A,  02/27/2018); Esophagogastroduodenoscopy (egd) with propofol  (N/A, 02/27/2018); Incisional hernia repair (N/A, 11/23/2018); Insertion of mesh (N/A, 11/23/2018); Esophagogastroduodenoscopy (egd) with propofol  (N/A, 05/09/2021); Esophagogastroduodenoscopy (egd) with propofol  (N/A, 03/09/2022); Esophagogastroduodenoscopy (egd) with propofol  (N/A, 03/30/2023); bypass gastrojejunostomy; Lysis of adhesion (N/A, 05/12/2023); Gastrojejunostomy (N/A, 05/12/2023); IR GASTROSTOMY TUBE MOD SED (05/30/2023); IR Replc Duoden/Jejuno Tube Percut W/Fluoro (06/07/2023); IR Replc Gastro/Colonic Tube Percut W/Fluoro (06/17/2023); and IR Replc Duoden/Jejuno Tube Percut W/Fluoro (06/21/2023). Family: family history includes Alcohol abuse in her maternal grandmother and sister; Bipolar disorder in her sister; Breast cancer (age of onset: 26) in her mother; Cancer in her mother; Drug abuse in her sister; Lung cancer in her father; Stroke in her maternal grandmother and paternal grandmother.  Laboratory Chemistry Profile   Renal Lab Results  Component Value Date   BUN 10 06/22/2023   CREATININE 0.53 06/22/2023   BCR 23 02/09/2019   GFR 77.68 03/17/2023   GFRAA 82 02/09/2019   GFRNONAA >60 06/22/2023    Hepatic Lab Results  Component Value Date   AST 30 06/20/2023   ALT 32 06/20/2023   ALBUMIN 2.0 (L) 06/20/2023   ALKPHOS 99 06/20/2023   LIPASE 55 (H) 06/20/2023    Electrolytes Lab Results  Component Value Date   NA 137 06/22/2023   K 3.5 06/22/2023   CL 106 06/22/2023   CALCIUM  7.5 (L) 06/22/2023   MG 1.7 06/22/2023   PHOS 3.9 06/22/2023    Bone Lab Results  Component Value Date   VD25OH 71.09 05/13/2023    Inflammation (CRP: Acute Phase) (ESR: Chronic Phase) No results found for: CRP, ESRSEDRATE, LATICACIDVEN       Note: Above Lab results reviewed.  Recent Imaging Review  IR Replc Duoden/Jejuno Tube Percut W/Fluoro INDICATION: Malfunctioning indwelling 14 French red rubber jejunostomy  catheter. Jejunostomy recently exchanged on 06/07/2023 for occlusion and again on 06/17/2023 for complete dislodgement. The catheter is now partially obstructed and poorly functioning.  EXAM: REPLACEMENT OF JEJUNOSTOMY CATHETER UNDER FLUOROSCOPY  MEDICATIONS: None  ANESTHESIA/SEDATION: Moderate (conscious) sedation was employed during this procedure. A total of Versed  1.0 mg and Fentanyl  100 mcg was administered intravenously.  Moderate Sedation Time: 12 minutes. The patient's level of consciousness and vital signs were monitored continuously by radiology nursing throughout the procedure under my direct supervision.  CONTRAST:  10 mL Omnipaque  300-administered into the gastric lumen.  FLUOROSCOPY TIME:  Fluoroscopy Time: 1 minute and 21 seconds. 1.4 mGy.  COMPLICATIONS: None immediate.  PROCEDURE: Informed written consent was obtained from the patient after a thorough discussion of the procedural risks, benefits and alternatives. All questions were addressed. Maximal Sterile Barrier Technique was utilized including caps, mask, sterile gowns, sterile gloves, sterile drape, hand hygiene and skin antiseptic. A timeout was performed prior to the initiation of the procedure.  A pre-existing 14 French jejunostomy catheter was injected with contrast material and removed over a guidewire. A new 16 French balloon retention jejunostomy tube was advanced over the wire. The retention balloon was inflated with 5 mL of saline. The catheter was injected with contrast to confirm position and a fluoroscopic image saved.  FINDINGS: The new and slightly larger single-lumen jejunostomy was advanced further into the small bowel compared to the pre-existing jejunostomy. The catheter is ready  for immediate use.  IMPRESSION: Exchange and up sizing of feeding jejunostomy catheter under fluoroscopy. A 14 French red rubber catheter was exchanged for a 16 French balloon retention  jejunostomy.  Electronically Signed   By: Marcey Moan M.D.   On: 06/21/2023 16:07 Note: Reviewed        Physical Exam  General appearance: Well nourished, well developed, and well hydrated. In no apparent acute distress Mental status: Alert, oriented x 3 (person, place, & time)       Respiratory: No evidence of acute respiratory distress Eyes: PERLA Vitals: BP 106/73 (BP Location: Right Arm, Patient Position: Sitting, Cuff Size: Normal)   Pulse 100   Temp (!) 97.1 F (36.2 C) (Temporal)   Resp 16   Ht 4' 11 (1.499 m)   Wt 86 lb 6.4 oz (39.2 kg)   SpO2 99%   BMI 17.45 kg/m  BMI: Estimated body mass index is 17.45 kg/m as calculated from the following:   Height as of this encounter: 4' 11 (1.499 m).   Weight as of this encounter: 86 lb 6.4 oz (39.2 kg). Ideal: Female patients must weigh at least 45.5 kg to calculate ideal body weight  Assessment   Diagnosis Status  1. Chronic pain syndrome   2. Idiopathic peripheral neuropathy   3. Plantar fascial fibromatosis of both feet   4. Bilateral foot pain   5. PTSD (post-traumatic stress disorder)    Controlled Controlled Controlled   Updated Problems: No problems updated.  Plan of Care  Problem-specific:  Assessment and Plan We will continue on current medication regimen.  Prescribing drug monitoring (PDMP) reviewed; findings consistent with the use of prescribed medication and no evidence of narcotic misuse or abuse.  UDS up-to-date.  Schedule follow-up in 90 days for medication management.  Ms. Breyona Swander has a current medication list which includes the following long-term medication(s): aripiprazole , levothyroxine , mirtazapine , pantoprazole , and sumatriptan .  Pharmacotherapy (Medications Ordered): Meds ordered this encounter  Medications   traMADol  (ULTRAM ) 50 MG tablet    Sig: Take 1 tablet (50 mg total) by mouth every 12 (twelve) hours as needed. Must last 30 days.    Dispense:  60 tablet    Refill:  2    Orders:  No orders of the defined types were placed in this encounter.       Return in about 3 months (around 11/29/2023) for (F2F), (MM), Emmy Blanch NP.    Recent Visits Date Type Provider Dept  07/05/23 Office Visit Anishka Bushard K, NP [facility]  Showing recent visits within past 90 days and meeting all other requirements Today's Visits Date Type Provider Dept  08/29/23 Office Visit Dainelle Hun K, NP [facility]  Showing today's visits and meeting all other requirements Future Appointments Date Type Provider Dept  11/24/23 Appointment Quint Chestnut K, NP [facility]  Showing future appointments within next 90 days and meeting all other requirements  I discussed the assessment and treatment plan with the patient. The patient was provided an opportunity to ask questions and all were answered. The patient agreed with the plan and demonstrated an understanding of the instructions.  Patient advised to call back or seek an in-person evaluation if the symptoms or condition worsens.  Duration of encounter: 30 minutes.  Total time on encounter, as per AMA guidelines included both the face-to-face and non-face-to-face time personally spent by the physician and/or other qualified health care professional(s) on the day of the encounter (includes time in activities  that require the physician or other qualified health care professional and does not include time in activities normally performed by clinical staff). Physician's time may include the following activities when performed: Preparing to see the patient (e.g., pre-charting review of records, searching for previously ordered imaging, lab work, and nerve conduction tests) Review of prior analgesic pharmacotherapies. Reviewing PMP Interpreting ordered tests (e.g., lab work, imaging, nerve conduction tests) Performing post-procedure evaluations, including interpretation of diagnostic procedures Obtaining  and/or reviewing separately obtained history Performing a medically appropriate examination and/or evaluation Counseling and educating the patient/family/caregiver Ordering medications, tests, or procedures Referring and communicating with other health care professionals (when not separately reported) Documenting clinical information in the electronic or other health record Independently interpreting results (not separately reported) and communicating results to the patient/ family/caregiver Care coordination (not separately reported)  Note by: Jeramiah Mccaughey K Bea Duren, NP (TTS and AI technology used. I apologize for any typographical errors that were not detected and corrected.) Date: 08/29/2023; Time: 12:04 PM

## 2023-08-30 ENCOUNTER — Other Ambulatory Visit: Payer: Self-pay | Admitting: Psychiatry

## 2023-08-30 DIAGNOSIS — F3341 Major depressive disorder, recurrent, in partial remission: Secondary | ICD-10-CM

## 2023-08-31 ENCOUNTER — Other Ambulatory Visit (INDEPENDENT_AMBULATORY_CARE_PROVIDER_SITE_OTHER)

## 2023-08-31 ENCOUNTER — Ambulatory Visit: Payer: Self-pay | Admitting: Primary Care

## 2023-08-31 ENCOUNTER — Encounter: Admitting: Surgery

## 2023-08-31 DIAGNOSIS — E038 Other specified hypothyroidism: Secondary | ICD-10-CM | POA: Diagnosis not present

## 2023-08-31 LAB — TSH: TSH: 0.58 u[IU]/mL (ref 0.35–5.50)

## 2023-09-01 ENCOUNTER — Other Ambulatory Visit: Payer: Self-pay

## 2023-09-01 NOTE — Patient Outreach (Signed)
 Complex Care Management   Visit Note  09/01/2023  Name:  Tanya Nguyen MRN: [id number] DOB: 1965/08/08  Situation: Referral received for Complex Care Management related to Malnutrition I obtained verbal consent from Patient.  Visit completed with Patient  on the phone  Background:   Past Medical History:  Diagnosis Date   Acquired pyloric stricture    Bipolar disorder (HCC)    Bleeding duodenal ulcer    CAP (community acquired pneumonia) 02/12/2020   Chronic esophagogastric ulcer    Chronic low back pain    Chronic pain syndrome    Coffee ground emesis 03/29/2023   Constipation 04/02/2014   Depression    Duodenal obstruction    Gastric outlet obstruction 01/19/2018   GERD (gastroesophageal reflux disease)    Hair loss 09/22/2018   Head injury 12/24/2022   History of stomach ulcers    Hypothyroidism    IDA (iron  deficiency anemia)    Incisional hernia, without obstruction or gangrene    Intractable vomiting    Kidney stones    Melena 05/08/2021   Migraine    Neuropathy    Nodule of middle lobe of right lung 09/17/2021   NSAID long-term use 09/16/2016   Pre-diabetes    Protein-calorie malnutrition, severe (HCC)    PTSD (post-traumatic stress disorder)    Right-sided chest pain 09/17/2021   Sacroiliac joint pain 08/14/2018   Side pain 03/30/2018   Small bowel anastomotic dilation    Thrombocytosis    Upper GI bleed 05/18/2021   Vitamin B12 deficiency    Vitamin D  deficiency     Assessment: Patient Reported Symptoms:  Cognitive Cognitive Status: Alert and oriented to person, place, and time, Insightful and able to interpret abstract concepts, Normal speech and language skills   Health Maintenance Behaviors: Annual physical exam, Sleep adequate Healing Pattern: Average Health Facilitated by: Pain control, Rest  Neurological Neurological Review of Symptoms: No symptoms reported Neurological Conditions: Headache (hx migraines - not in long time per  patient) Neurological Management Strategies: Medication therapy Neurological Self-Management Outcome: 4 (good)  HEENT HEENT Symptoms Reported: No symptoms reported HEENT Management Strategies: Medical device, Routine screening (glasses) HEENT Comment: seeing dentist for permanent crown    Cardiovascular Cardiovascular Symptoms Reported: No symptoms reported Does patient have uncontrolled Hypertension?: No Weight: 88 lb (39.9 kg) (patient reported)  Respiratory Respiratory Symptoms Reported: No symptoms reported    Endocrine Patient reports the following symptoms related to hypoglycemia or hyperglycemia : Not assessed Is patient diabetic?: No Endocrine Conditions: Thyroid  disorder Endocrine Management Strategies: Medication therapy Endocrine Comment: thyroid  labs WNL - repeat in one month  Gastrointestinal Gastrointestinal Symptoms Reported: No symptoms reported Gastrointestinal Management Strategies: Incontinence garment/pad, Nutrition support, Medication therapy (J Tube) Enteral Nutrition Time Frame: Cyclic Enteral Nutrition Schedule: Nightly TF, eating soft and regular foods during the day Parenteral Nutrition Method of Administration: Cyclic Parenteral Nutrition Schedule: nightly Gastrointestinal Comment: reports improvement - gaining weight Nutrition Risk Screen (CP): Tube feeding or parenteral nutrition  Genitourinary Genitourinary Symptoms Reported: No symptoms reported Genitourinary Management Strategies: Medication therapy  Integumentary Integumentary Symptoms Reported: No symptoms reported Additional Integumentary Details: no open areas/pressure sores, areas around tubing no issues Skin Management Strategies: Dressing changes, Routine screening Skin Self-Management Outcome: 4 (good)  Musculoskeletal Musculoskelatal Symptoms Reviewed: No symptoms reported Other Musculoskeletal Conditions: Neuopathy improved Musculoskeletal Management Strategies: Medication therapy Falls in  the past year?: No Number of falls in past year: 1 or less Was there an injury with Fall?: No Fall Risk Category Calculator: 0 Patient  Fall Risk Level: Low Fall Risk Patient at Risk for Falls Due to: History of fall(s) Fall risk Follow up: Falls evaluation completed  Psychosocial Psychosocial Symptoms Reported: Sadness - if selected complete PHQ 2-9 Additional Psychological Details: down about my hair coming out Behavioral Health Conditions: Anxiety, Depression, Bipolar affective disorder Behavioral Management Strategies: Counseling, Medication therapy, Adequate rest, Support system Behavioral Health Self-Management Outcome: 4 (good) Behavioral Health Comment: patient reports improving Major Change/Loss/Stressor/Fears (CP): Medical condition, self Techniques to Cope with Loss/Stress/Change: Counseling, Spiritual practice(s) Quality of Family Relationships: helpful, involved, supportive Do you feel physically threatened by others?: No      09/01/2023   11:41 AM  Depression screen PHQ 2/9  Decreased Interest 0  Down, Depressed, Hopeless 1  PHQ - 2 Score 1  Altered sleeping 0  Tired, decreased energy 1  Feeling bad or failure about yourself  1  Trouble concentrating 0  Moving slowly or fidgety/restless 0  Suicidal thoughts 0  PHQ-9 Score 3    There were no vitals filed for this visit.  Medications Reviewed Today     Reviewed by Devra Lands, RN (Registered Nurse) on 09/01/23 at 1128  Med List Status: <None>   Medication Order Taking? Sig Documenting Provider Last Dose Status Informant  acetaminophen  (TYLENOL ) 500 MG tablet [id number] Yes Take 500-1,000 mg by mouth every 6 (six) hours as needed for moderate pain (pain score 4-6) or headache. [provider]  Active Other, Pharmacy Records, Self           Med Note CATHY OVAL DEL   Dju May 28, 2023  1:34 PM) prn  ARIPiprazole  (ABILIFY ) 2 MG tablet [id number] Yes Take 1 tablet (2 mg total) by mouth at bedtime.   Patient taking differently: Take 2 mg by mouth at bedtime. Patient states current prescription - was filled for 90 days and has enough until seeing provider 09/19/2023   Eappen, Saramma, MD  Active Other, Pharmacy Records, Self  bisacodyl (DULCOLAX) 5 MG EC tablet [id number]  Take 5 mg by mouth daily as needed for moderate constipation.  Patient not taking: Reported on 09/01/2023   [provider]  Active Other, Pharmacy Records, Self           Med Note CATHY OVAL DEL   Dju May 28, 2023  1:34 PM) prn  diphenhydrAMINE  (BENADRYL ) 25 MG tablet [id number] Yes Take 25 mg by mouth every 6 (six) hours as needed for allergies. [provider]  Active Other, Pharmacy Records, Self           Med Note CATHY OVAL DEL   Dju May 28, 2023  1:34 PM) prn  levothyroxine  (SYNTHROID ) 75 MCG tablet [id number] Yes Take 1 tablet by mouth every morning on an empty stomach with water  only.  No food or other medications for 30 minutes. Gretta Comer POUR, NP  Active   mirtazapine  (REMERON ) 15 MG tablet [id number] Yes Take 1 tablet (15 mg total) by mouth at bedtime. Hisada, Reina, MD  Active   Multiple Vitamin (MULTIVITAMIN WITH MINERALS) TABS tablet [id number] Yes Take 1 tablet by mouth daily. Fausto Burnard LABOR, DO  Active Other, Pharmacy Records, Self  ondansetron  (ZOFRAN -ODT) 4 MG disintegrating tablet [id number] Yes Take 1 tablet (4 mg total) by mouth every 8 (eight) hours as needed for nausea. Clark, Katherine K, NP  Active   oxybutynin  (DITROPAN -XL) 5 MG 24 hr tablet [id number]  TAKE 1 TABLET (5 MG TOTAL) BY MOUTH AT BEDTIME. FOR BLADDER INCONTINENCE.  Patient not taking:  Reported on 09/01/2023   Gretta Comer POUR, NP  Active Other, Pharmacy Records, Self  pantoprazole  (PROTONIX ) 40 MG tablet [id number] Yes Take 1 tablet (40 mg total) by mouth daily. [place], PA-C  Active Other, Pharmacy Records, Self           Med Note (STEFFENS, MICHELLE P   Thu Jul 07, 2023 10:21 AM)    SUMAtriptan   (IMITREX ) 50 MG tablet [id number] Yes Take 1 tablet at migraine onset. May repeat in 2 hours if headache persists or recurs. Gretta Comer POUR, NP  Active Other, Pharmacy Records, Self           Med Note CATHY OVAL DEL   Dju May 28, 2023  1:34 PM) prn  traMADol  (ULTRAM ) 50 MG tablet [id number] Yes Take 1 tablet (50 mg total) by mouth every 12 (twelve) hours as needed. Must last 30 days. Patel, Seema K, NP  Active   Med List Note Geanie Wolm HERO, RN 08/29/23 1140): Patient discharged from [facility] 05/27/23 Pain contract signed 09/17/21 PA for Tramadol  done via [facility]  Key [id number]  kt MR 02-24-2022             Recommendation:   PCP Follow-up Continue Current Plan of Care  Follow Up Plan:   Telephone follow-up in 1 month  Nestora Duos, MSN, RN [facility]  [facility], [facility] RN Care Manager Direct Dial: [phone] Fax: [phone]

## 2023-09-01 NOTE — Patient Instructions (Signed)
 Visit Information  Thank you for taking time to visit with me today. Please don't hesitate to contact me if I can be of assistance to you before our next scheduled appointment.  Your next care management appointment is by telephone on 09/29/2023 at 10:00 am  Telephone follow-up in 1 month  Please call the care guide team at [phone] if you need to cancel, schedule, or reschedule an appointment.   Please call the Suicide and Crisis Lifeline: 988 call the [place]  National Suicide Prevention Lifeline: 949-017-4337 or TTY: 417-025-4488 TTY 660-483-6481) to talk to a trained counselor call 1-800-273-TALK (toll free, 24 hour hotline) go to [facility] [address], [place] [phone]) call 911 if you are experiencing a Mental Health or Behavioral Health Crisis or need someone to talk to.  Nestora Duos, MSN, RN [facility], [facility] RN Care Manager Direct Dial: [phone] Fax: [phone]

## 2023-09-02 ENCOUNTER — Ambulatory Visit (INDEPENDENT_AMBULATORY_CARE_PROVIDER_SITE_OTHER): Admitting: Surgery

## 2023-09-02 ENCOUNTER — Encounter: Payer: Self-pay | Admitting: Surgery

## 2023-09-02 ENCOUNTER — Other Ambulatory Visit
Admission: RE | Admit: 2023-09-02 | Discharge: 2023-09-02 | Disposition: A | Discharge status: Home / Self Care | Source: Ambulatory Visit | Attending: Surgery | Admitting: Surgery

## 2023-09-02 VITALS — BP 107/68 | HR 108 | Temp 98.3°F | Ht 59.0 in | Wt 89.6 lb

## 2023-09-02 DIAGNOSIS — K9413 Enterostomy malfunction: Secondary | ICD-10-CM

## 2023-09-02 DIAGNOSIS — Z980 Intestinal bypass and anastomosis status: Secondary | ICD-10-CM | POA: Diagnosis not present

## 2023-09-02 LAB — VITAMIN B12: Vitamin B-12: 171 pg/mL — ABNORMAL LOW (ref 180–914)

## 2023-09-02 LAB — ALBUMIN: Albumin: 3.7 g/dL (ref 3.5–5.0)

## 2023-09-02 LAB — FOLATE: Folate: 19 ng/mL (ref >5.9)

## 2023-09-02 NOTE — Telephone Encounter (Signed)
 Copied from CRM [id number]. Topic: Clinical - Lab/Test Results >> Sep 02, 2023  3:16 PM Donna BRAVO wrote: Reason for CRM: patient returning a call from Andrez Andrez JAYSON Narciso, [facility] 09/02/2023  3:03 PM EDT Back to Top  Unable to reach patient. Left voicemail to return call to our office.   3rd attempt, mailing letter.

## 2023-09-02 NOTE — Telephone Encounter (Signed)
 Called patient and reviewed all information. Patient verbalized understanding.  Scheduled CPE in Oct. Lab appt for 1 month already scheduled.  Will call if any further questions.

## 2023-09-02 NOTE — Patient Instructions (Addendum)
 We scheduled you a small bowel follow through at [facility] on 09/06/2023 at 9am, please arrive at 8:45 in the medical mall. Nothing to eat or drink after Midnight.  We also ordered some labs for you to have done at [facility], you do not need an appointment, please get those done as soon as you can so we can get the results back before your next appointment   We will see you back in 3 weeks, in the meantime if you have any questions or concerns please don't hesitate to call the office

## 2023-09-02 NOTE — Progress Notes (Unsigned)
 09/02/2023  History of Present Illness: Tanya Nguyen is a 58 y.o. female s/p exploratory laparotomy, excision of prior loop gastrojejunostomy, and creating of new RNY type gastrojejunostoy with a feeding distal jejunostomy tube placement on 05/12/23.  She also required a venting G tube placed by IR on 05/30/23.  Her last visit with me was on 08/03/23 at which time she was advised to start more regular consistency type of foods, and continue her J tube feeds at night and G tube venting as needed.  Since her last visit, she has continued to make progress and continues to gain weight.  Her current weight of 89 lbs now finally puts her BMI within normal range at 18.1, up from preop weight of 77 lbs and preop BMI of 15.6.  Overall the patient reports that she's been doing well.  She continues to eat regular foods and has not had issues when eating chicken tenders or hamburgers.  She vents her G tube occasionally only, about once per week.  She continues with night time J tube feeds.  Denies any abdominal pain or any issues with the current J tube or G tube.  She does report hair loss.  Past Medical History: Past Medical History:  Diagnosis Date   Acquired pyloric stricture    Bipolar disorder (HCC)    Bleeding duodenal ulcer    CAP (community acquired pneumonia) 02/12/2020   Chronic esophagogastric ulcer    Chronic low back pain    Chronic pain syndrome    Coffee ground emesis 03/29/2023   Constipation 04/02/2014   Depression    Duodenal obstruction    Gastric outlet obstruction 01/19/2018   GERD (gastroesophageal reflux disease)    Hair loss 09/22/2018   Head injury 12/24/2022   History of stomach ulcers    Hypothyroidism    IDA (iron  deficiency anemia)    Incisional hernia, without obstruction or gangrene    Intractable vomiting    Kidney stones    Melena 05/08/2021   Migraine    Neuropathy    Nodule of middle lobe of right lung 09/17/2021   NSAID long-term use 09/16/2016   Pre-diabetes     Protein-calorie malnutrition, severe (HCC)    PTSD (post-traumatic stress disorder)    Right-sided chest pain 09/17/2021   Sacroiliac joint pain 08/14/2018   Side pain 03/30/2018   Small bowel anastomotic dilation    Thrombocytosis    Upper GI bleed 05/18/2021   Vitamin B12 deficiency    Vitamin D  deficiency      Past Surgical History: Past Surgical History:  Procedure Laterality Date   ABDOMINAL HYSTERECTOMY  03/08/2002   partial   bypass gastrojejunostomy     CHOLECYSTECTOMY     COLONOSCOPY WITH PROPOFOL  N/A 02/27/2018   Procedure: COLONOSCOPY WITH PROPOFOL ;  Surgeon: Jinny Carmine, MD;  Location: [facility];  Service: Endoscopy;  Laterality: N/A;   ESOPHAGOGASTRODUODENOSCOPY (EGD) WITH PROPOFOL  N/A 01/12/2018   Procedure: ESOPHAGOGASTRODUODENOSCOPY (EGD) WITH BIOPSIES;  Surgeon: Jinny Carmine, MD;  Location: [facility];  Service: Endoscopy;  Laterality: N/A;   ESOPHAGOGASTRODUODENOSCOPY (EGD) WITH PROPOFOL  N/A 01/25/2018   Procedure: ESOPHAGOGASTRODUODENOSCOPY (EGD) WITH PROPOFOL ;  Surgeon: Therisa Bi, MD;  Location: [facility];  Service: Gastroenterology;  Laterality: N/A;   ESOPHAGOGASTRODUODENOSCOPY (EGD) WITH PROPOFOL  N/A 02/27/2018   Procedure: ESOPHAGOGASTRODUODENOSCOPY (EGD) WITH PROPOFOL ;  Surgeon: Jinny Carmine, MD;  Location: [facility];  Service: Endoscopy;  Laterality: N/A;   ESOPHAGOGASTRODUODENOSCOPY (EGD) WITH PROPOFOL  N/A 05/09/2021   Procedure: ESOPHAGOGASTRODUODENOSCOPY (EGD) WITH PROPOFOL ;  Surgeon: Onita Elspeth Sharper, DO;  Location: [facility];  Service: Gastroenterology;  Laterality: N/A;   ESOPHAGOGASTRODUODENOSCOPY (EGD) WITH PROPOFOL  N/A 03/09/2022   Procedure: ESOPHAGOGASTRODUODENOSCOPY (EGD) WITH PROPOFOL ;  Surgeon: Jinny Carmine, MD;  Location: [facility];  Service: Endoscopy;  Laterality: N/A;   ESOPHAGOGASTRODUODENOSCOPY (EGD) WITH PROPOFOL  N/A 03/30/2023   Procedure: ESOPHAGOGASTRODUODENOSCOPY (EGD) WITH  PROPOFOL ;  Surgeon: Jinny Carmine, MD;  Location: [facility];  Service: Endoscopy;  Laterality: N/A;   GASTROJEJUNOSTOMY N/A 05/12/2023   Procedure: GASTROJEJUNOSTOMY, revision, Arthea Platt, PA-C to assist, possible feeding jejunostomy;  Surgeon: Desiderio Schanz, MD;  Location: [facility];  Service: General;  Laterality: N/A;   INCISIONAL HERNIA REPAIR N/A 11/23/2018   Procedure: HERNIA REPAIR INCISIONAL;  Surgeon: Desiderio Schanz, MD;  Location: [facility];  Service: General;  Laterality: N/A;   INSERTION OF MESH N/A 11/23/2018   Procedure: INSERTION OF MESH;  Surgeon: Desiderio Schanz, MD;  Location: [facility];  Service: General;  Laterality: N/A;   IR GASTROSTOMY TUBE MOD SED  05/30/2023   IR REPLC DUODEN/JEJUNO TUBE PERCUT W/FLUORO  06/07/2023   IR REPLC DUODEN/JEJUNO TUBE PERCUT W/FLUORO  06/21/2023   IR REPLC GASTRO/COLONIC TUBE PERCUT W/FLUORO  06/17/2023   LAPAROTOMY N/A 01/20/2018   Procedure: EXPLORATORY LAPAROTOMY;  Surgeon: Desiderio Schanz, MD;  Location: [facility];  Service: General;  Laterality: N/A;   LYSIS OF ADHESION N/A 05/12/2023   Procedure: LYSIS OF ADHESION, open, Arthea Platt, PA-C to assist;  Surgeon: Desiderio Schanz, MD;  Location: [facility];  Service: General;  Laterality: N/A;   TOOTH EXTRACTION      Home Medications: Prior to Admission medications   Medication Sig Start Date End Date Taking? Authorizing Provider  acetaminophen  (TYLENOL ) 500 MG tablet Take 500-1,000 mg by mouth every 6 (six) hours as needed for moderate pain (pain score 4-6) or headache.    [provider]  ARIPiprazole  (ABILIFY ) 2 MG tablet Take 1 tablet (2 mg total) by mouth at bedtime. Patient taking differently: Take 2 mg by mouth at bedtime. Patient states current prescription - was filled for 90 days and has enough until seeing provider 09/19/2023 05/11/23 09/01/23  Eappen, Saramma, MD  levothyroxine  (SYNTHROID ) 75 MCG tablet Take 1 tablet by mouth every morning on an empty stomach with water  only.  No food  or other medications for 30 minutes. 07/18/23   Clark, Katherine K, NP  mirtazapine  (REMERON ) 15 MG tablet Take 1 tablet (15 mg total) by mouth at bedtime. 08/31/23 11/29/23  Hisada, Reina, MD  Multiple Vitamin (MULTIVITAMIN WITH MINERALS) TABS tablet Take 1 tablet by mouth daily. 04/01/23   Fausto Burnard LABOR, DO  ondansetron  (ZOFRAN -ODT) 4 MG disintegrating tablet Take 1 tablet (4 mg total) by mouth every 8 (eight) hours as needed for nausea. 07/01/23   Clark, Katherine K, NP  oxybutynin  (DITROPAN -XL) 5 MG 24 hr tablet TAKE 1 TABLET (5 MG TOTAL) BY MOUTH AT BEDTIME. FOR BLADDER INCONTINENCE. Patient not taking: Reported on 09/01/2023 01/21/23   Gretta Comer POUR, NP  pantoprazole  (PROTONIX ) 40 MG tablet Take 1 tablet (40 mg total) by mouth daily. 05/04/23 04/28/24  [place], PA-C  SUMAtriptan  (IMITREX ) 50 MG tablet Take 1 tablet at migraine onset. May repeat in 2 hours if headache persists or recurs. 03/08/23   Clark, Katherine K, NP  traMADol  (ULTRAM ) 50 MG tablet Take 1 tablet (50 mg total) by mouth every 12 (twelve) hours as needed. Must last 30 days. 08/29/23   Patel, Seema K, NP    Allergies: Allergies  Allergen Reactions   Penicillins Shortness Of Breath, Diarrhea and Nausea And Vomiting    TOLERATED CEPHALOSPORINS   Nsaids Other (See Comments)    Pt states NSAIDS cause internal bleeding   Gabapentin  Other (See Comments)    Hair Loss    Pregabalin      Tremors   Sulfa Antibiotics Nausea And Vomiting    Review of Systems: Review of Systems  Constitutional:  Negative for chills and fever.  Respiratory:  Negative for shortness of breath.   Cardiovascular:  Negative for chest pain.  Gastrointestinal:  Negative for abdominal pain, nausea and vomiting.  Skin:  Negative for rash.    Physical Exam BP 107/68   Pulse (!) 108   Temp 98.3 F (36.8 C) (Oral)   Ht 4' 11 (1.499 m)   Wt 89 lb 9.6 oz (40.6 kg)   SpO2 97%   BMI 18.10 kg/m  CONSTITUTIONAL: No acute distress HEENT:   Normocephalic, atraumatic, extraocular motion intact. RESPIRATORY:  Normal respiratory effort without pathologic use of accessory muscles. CARDIOVASCULAR: Stable low grade sinus tachycardia. GI: The abdomen is soft, non-distended, non-tender to palpation.  Midline incision is well healed, without hernia.  Her G tube and J tube are in good position, with minimal surrounding skin changes.  NEUROLOGIC:  Motor and sensation is grossly normal.  Cranial nerves are grossly intact. PSYCH:  Alert and oriented to person, place and time. Affect is normal.   Assessment and Plan: This is a 58 y.o. female s/p exlap, excision of prior loop gastrojejunostomy and creation of new RNY type gastrojejunostomy with feeding jejunostomy   --The patient continues to gain weight and now her BMI is finally within normal range.  Discussed with her that I would want her BMI to continue to improve prior to stopping her tube feeds.  Also, would be good to repeat some of her nutrition labs.  Her copper  level was still low on her last chest in April and her TSH just normalized earlier this month.  These prior deficiencies could have contributed to her hair loss, and as long as her nutrition continues to improve, this should improve as well.  Will send for new labs today. --Also discussed with her that one of the main issues that led to her last surgery was that the loop gastrojejunostomy was becoming very distended resulting is contrast remaining her stomach after 3 hrs and the proximal bowel being very distended, causing her pain and nausea/vomiting.  Will repeat her SBFT study to re-evaluate on her bowel distention and contrast progression.  If all this is normal, we may be able to remove her G tube as venting would not be needed any further.   --Once her weight has improved more and we know that all her nutrition labs are within normal, we could stop her tube feeds in the future.  As long as she can keep her weight and nutrition on  her own, then we could remove the J feeding tube in the future as well.  But discussed with her that the first step would be removing her G tube if the SBFT is normal. --Follow up in 3 weeks.  I spent 30 minutes dedicated to the care of this patient on the date of this encounter to include pre-visit review of records, face-to-face time with the patient discussing diagnosis and management, and any post-visit coordination of care.   Aloysius Sheree Plant, MD [facility]

## 2023-09-04 LAB — ZINC: Zinc: 48 ug/dL (ref 44–115)

## 2023-09-05 LAB — VITAMIN A: Vitamin A (Retinoic Acid): 73 ug/dL — ABNORMAL HIGH (ref 20.1–62.0)

## 2023-09-05 LAB — COPPER, SERUM: Copper: 8 ug/dL — ABNORMAL LOW (ref 80–158)

## 2023-09-06 ENCOUNTER — Ambulatory Visit
Admission: RE | Admit: 2023-09-06 | Discharge: 2023-09-06 | Disposition: A | Discharge status: Home / Self Care | Source: Ambulatory Visit | Attending: Surgery | Admitting: Surgery

## 2023-09-06 DIAGNOSIS — Z980 Intestinal bypass and anastomosis status: Secondary | ICD-10-CM | POA: Diagnosis not present

## 2023-09-06 DIAGNOSIS — Z4682 Encounter for fitting and adjustment of non-vascular catheter: Secondary | ICD-10-CM | POA: Diagnosis not present

## 2023-09-06 MED ORDER — IOHEXOL 300 MG/ML  SOLN
50.0000 mL | Freq: Once | INTRAMUSCULAR | Status: AC | PRN
  Administered 2023-09-06: 50 mL via ORAL

## 2023-09-06 MED ORDER — IOHEXOL 300 MG/ML  SOLN
150.0000 mL | Freq: Once | INTRAMUSCULAR | Status: AC | PRN
  Administered 2023-09-06: 25 mL via ORAL
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
## 2023-09-19 ENCOUNTER — Telehealth: Payer: Self-pay

## 2023-09-19 ENCOUNTER — Ambulatory Visit (INDEPENDENT_AMBULATORY_CARE_PROVIDER_SITE_OTHER): Admitting: Psychiatry

## 2023-09-19 ENCOUNTER — Encounter: Payer: Self-pay | Admitting: Psychiatry

## 2023-09-19 VITALS — BP 122/76 | HR 104 | Temp 98.5°F | Ht 59.0 in | Wt 94.0 lb

## 2023-09-19 DIAGNOSIS — F330 Major depressive disorder, recurrent, mild: Secondary | ICD-10-CM

## 2023-09-19 DIAGNOSIS — F3341 Major depressive disorder, recurrent, in partial remission: Secondary | ICD-10-CM

## 2023-09-19 DIAGNOSIS — L659 Nonscarring hair loss, unspecified: Secondary | ICD-10-CM

## 2023-09-19 DIAGNOSIS — G4700 Insomnia, unspecified: Secondary | ICD-10-CM

## 2023-09-19 DIAGNOSIS — F4310 Post-traumatic stress disorder, unspecified: Secondary | ICD-10-CM

## 2023-09-19 MED ORDER — ARIPIPRAZOLE 2 MG PO TABS: 2.0000 mg | ORAL_TABLET | Freq: Every day | ORAL | 0 refills | Status: DC

## 2023-09-19 MED ORDER — RAMELTEON 8 MG PO TABS: 8.0000 mg | ORAL_TABLET | Freq: Every evening | ORAL | 1 refills | Status: DC | PRN

## 2023-09-19 NOTE — Telephone Encounter (Signed)
 Patient called to report that the new medication that was prescribed by Dr Hisada she can not afford it for a 30 day supply the copay will be $100 please advise

## 2023-09-19 NOTE — Telephone Encounter (Signed)
 Could you ask if she would be open to trying a higher dose of Mirtazapine , 30 mg to see how it works for her?

## 2023-09-19 NOTE — Patient Instructions (Signed)
 Continue mirtazapine  15 mg at night  Continue Abilify  2 mg at night  Start rametelon 8 mg at night as needed for insomnia Next appointment: 8/21 at 10 AM

## 2023-09-20 NOTE — Telephone Encounter (Signed)
 Spoke to patient she stated that she does have some of the Mirtazapine  15 mg left and she will take 2 of those and will let us  know how she is doing with the increase

## 2023-09-20 NOTE — Telephone Encounter (Signed)
 Spoke to patient she stated that she is ok with trying the Mirtazapine  30 mg to see how it works for her please advise

## 2023-09-20 NOTE — Telephone Encounter (Signed)
 Please let her know that she may have a supply of mirtazapine  15 mg tablets remaining. Advise her to take two tablets to make up the prescribed 30 mg dose.  Also, remind her to notify the office if she begins to run low, so that a new prescription can be sent in as needed. Thanks.

## 2023-09-22 ENCOUNTER — Encounter: Payer: Self-pay | Admitting: Dietician

## 2023-09-22 NOTE — Progress Notes (Signed)
 Nutrition Brief Note  RD contacted by Dr. Desiderio to assist with vitamin supplementation and nutrition support.   58 y/o female with h/o chronic pain, GERD, open cholecystectomy, IBS-C, thyroid  disease, depression, anxiety, PTSD, bipolar disorder, SI, peripheral neuropathy, IDA, PUD with chronic BC powder use, kindney stones, GOO s/p exploratory laparotomy (with gastrojejunostomy with omega loop/jejunojejunostomy 01/2018) complicated by incisional hernia s/p open incisional hernia repair with mesh underlay 11/2018, recent admission for malfunctioning gastrojejunostomy s/p exploratory laparotomy with lysis of adhesions, resection of previous gastrojejunostomy and jejunojejunostomy, creation of new Roux-en-Y gastrojejunostomy and new feeding jejunostomy tube (24F) 3/6 and SBO requiring IR venting G-tube placement (82F) 3/24.   Spoke with patient and pt's caregiver, Donzell via the phone. Pt reports that she is doing well. Pt reports that she continues to do nocturnal tube feeds of Osmolite 1.2 @85ml /hr x 14 hours overnight. Pt reports that she has gained up to 94lbs. Pt reports that she has not been using her PEG tube for venting. Pt reports that she has been eating well by mouth; pt reports eating beef, chicken and smoothies without issue. Pt reports that she has been taking two chewable multivitamins (1 serving) daily. Pt reports that she is anxious to get the PEG tube out as she reports that its painful.   Caregiver expresses concerns today about patient having the feeding tubes removed as she reports pt with a h/o disordered eating and a desire to remain thin. Caregiver concerned that patient will not be able to maintain her weight with an oral diet alone.   RD discussed with patient and caregiver the recommendation for patient to continue tube feeds until she reaches her UBW of 110-115lbs. Once pt reaches her target weight, goal would be to hold tube feeds for 30 days to ensure that patient can maintain  her weight on oral nutrition alone prior to removing feeding tube. Of note, pt will not drink nutritional supplements. RD discussed with patient the option to change from jejunostomy tube feeds to intermittent gastric feeds via bolus; this would allow patient more absorptive space and would liberalize her from the feeding pump. Pt reports that she would prefer to continue to receive nocturnal feeds via the J-tube and have the PEG tube removed. Pt has a follow up appointment scheduled with Dr. Desiderio tomorrow; RD explained to patient that it would be up to MD when PEG tube would be removed.   Pt with numerous vitamin deficiencies noted below: RD provided pt and caregiver with recommendations for supplementation. Pt is at high risk for malabsorption; would recommend patient follow bariatric guidelines for vitamin supplementation and monitoring.   [email or web address]  Recommendations:  -Bariatric multivitamin po daily or a regular multivitamin po BID indefinitely  -Calcium  Citrate 1000-1500mg  po daily (taken in 3 intervals) indefinitely  -Cyanocobalamin  (B12) 500-1000mcg po daily as needed to maintain normal levels -Copper  tablets 8mg  po daily until values return to normal limits -Zinc  50mg  (elemental) po daily as long as pt is receiving copper  supplementation   Recommend for vitamin levels to be checked every 3 months until consistently within normal limits and then yearly for life.   Augustin Shams MS, RD, LDN Office- [phone]

## 2023-09-23 ENCOUNTER — Encounter: Payer: Self-pay | Admitting: Surgery

## 2023-09-23 ENCOUNTER — Ambulatory Visit: Admitting: Surgery

## 2023-09-23 VITALS — BP 107/77 | HR 73 | Temp 98.3°F | Ht 59.0 in | Wt 92.0 lb

## 2023-09-23 DIAGNOSIS — Z980 Intestinal bypass and anastomosis status: Secondary | ICD-10-CM | POA: Diagnosis not present

## 2023-09-23 DIAGNOSIS — K9413 Enterostomy malfunction: Secondary | ICD-10-CM | POA: Diagnosis not present

## 2023-09-23 NOTE — Patient Instructions (Signed)
 Nutrition Brief Note  RD contacted by Dr. Desiderio to assist with vitamin supplementation and nutrition support.   58 y/o female with h/o chronic pain, GERD, open cholecystectomy, IBS-C, thyroid  disease, depression, anxiety, PTSD, bipolar disorder, SI, peripheral neuropathy, IDA, PUD with chronic BC powder use, kindney stones, GOO s/p exploratory laparotomy (with gastrojejunostomy with omega loop/jejunojejunostomy 01/2018) complicated by incisional hernia s/p open incisional hernia repair with mesh underlay 11/2018, recent admission for malfunctioning gastrojejunostomy s/p exploratory laparotomy with lysis of adhesions, resection of previous gastrojejunostomy and jejunojejunostomy, creation of new Roux-en-Y gastrojejunostomy and new feeding jejunostomy tube (24F) 3/6 and SBO requiring IR venting G-tube placement (82F) 3/24.   Spoke with patient and pt's caregiver, Donzell via the phone. Pt reports that she is doing well. Pt reports that she continues to do nocturnal tube feeds of Osmolite 1.2 @85ml /hr x 14 hours overnight. Pt reports that she has gained up to 94lbs. Pt reports that she has not been using her PEG tube for venting. Pt reports that she has been eating well by mouth; pt reports eating beef, chicken and smoothies without issue. Pt reports that she has been taking two chewable multivitamins (1 serving) daily. Pt reports that she is anxious to get the PEG tube out as she reports that its painful.   Caregiver expresses concerns today about patient having the feeding tubes removed as she reports pt with a h/o disordered eating and a desire to remain thin. Caregiver concerned that patient will not be able to maintain her weight with an oral diet alone.   RD discussed with patient and caregiver the recommendation for patient to continue tube feeds until she reaches her UBW of 110-115lbs. Once pt reaches her target weight, goal would be to hold tube feeds for 30 days to ensure that patient can maintain  her weight on oral nutrition alone prior to removing feeding tube. Of note, pt will not drink nutritional supplements. RD discussed with patient the option to change from jejunostomy tube feeds to intermittent gastric feeds via bolus; this would allow patient more absorptive space and would liberalize her from the feeding pump. Pt reports that she would prefer to continue to receive nocturnal feeds via the J-tube and have the PEG tube removed. Pt has a follow up appointment scheduled with Dr. Desiderio tomorrow; RD explained to patient that it would be up to MD when PEG tube would be removed.   Pt with numerous vitamin deficiencies noted below: RD provided pt and caregiver with recommendations for supplementation. Pt is at high risk for malabsorption; would recommend patient follow bariatric guidelines for vitamin supplementation and monitoring.   [email or web address]  Recommendations:  -Bariatric multivitamin po daily or a regular multivitamin po BID indefinitely  -Calcium  Citrate 1000-1500mg  po daily (taken in 3 intervals) indefinitely  -Cyanocobalamin  (B12) 500-1000mcg po daily as needed to maintain normal levels -Copper  tablets 8mg  po daily until values return to normal limits -Zinc  50mg  (elemental) po daily as long as pt is receiving copper  supplementation   Recommend for vitamin levels to be checked every 3 months until consistently within normal limits and then yearly for life.   Augustin Shams MS, RD, LDN Office- [phone]

## 2023-09-23 NOTE — Progress Notes (Signed)
 09/23/2023  History of Present Illness: Makenlee Mckeag is a 58 y.o. female  s/p exploratory laparotomy, excision of prior loop gastrojejunostomy, and creating of new RNY type gastrojejunostoy with a feeding distal jejunostomy tube placement on 05/12/23.  She also required a venting G tube placed by IR on 05/30/23.  She was last seen on 09/02/2023.  At that time, her diet was more liberalized with regular diet as tolerated.  She continues with the J-tube feedings at nighttime.  She reports that she has not really needed the G-tube much at all.  When she vents it, not much comes out.  She had a small bowel follow-through study which showed appropriate flow of contrast through her bowels without any retained contrast in her stomach and without any dilated loops of small bowel like she had prior to surgery.  She had laboratory tests to check on her mineral/vitamin levels and her copper  level still quite low.  I have discussed this with Ms. Augustin Shams, RD, who had followed her during her hospitalization.  She discussed recommendations with Ms. Tabone yesterday and overall has recommended continuing with the tube feeds at night, taking a bariatric multivitamin daily, calcium  citrate, vitamin B12, copper , and zinc  supplementations.  She recommends checking her labs every 3 months for the time being.  The patient reports also having pain with both tubes and she feels that the feeding tube has almost come out.  Past Medical History: Past Medical History:  Diagnosis Date   Acquired pyloric stricture    Bipolar disorder (HCC)    Bleeding duodenal ulcer    CAP (community acquired pneumonia) 02/12/2020   Chronic esophagogastric ulcer    Chronic low back pain    Chronic pain syndrome    Coffee ground emesis 03/29/2023   Constipation 04/02/2014   Depression    Duodenal obstruction    Gastric outlet obstruction 01/19/2018   GERD (gastroesophageal reflux disease)    Hair loss 09/22/2018   Head injury 12/24/2022    History of stomach ulcers    Hypothyroidism    IDA (iron  deficiency anemia)    Incisional hernia, without obstruction or gangrene    Intractable vomiting    Kidney stones    Melena 05/08/2021   Migraine    Neuropathy    Nodule of middle lobe of right lung 09/17/2021   NSAID long-term use 09/16/2016   Pre-diabetes    Protein-calorie malnutrition, severe (HCC)    PTSD (post-traumatic stress disorder)    Right-sided chest pain 09/17/2021   Sacroiliac joint pain 08/14/2018   Side pain 03/30/2018   Small bowel anastomotic dilation    Thrombocytosis    Upper GI bleed 05/18/2021   Vitamin B12 deficiency    Vitamin D  deficiency      Past Surgical History: Past Surgical History:  Procedure Laterality Date   ABDOMINAL HYSTERECTOMY  03/08/2002   partial   bypass gastrojejunostomy     CHOLECYSTECTOMY     COLONOSCOPY WITH PROPOFOL  N/A 02/27/2018   Procedure: COLONOSCOPY WITH PROPOFOL ;  Surgeon: Jinny Carmine, MD;  Location: [facility];  Service: Endoscopy;  Laterality: N/A;   ESOPHAGOGASTRODUODENOSCOPY (EGD) WITH PROPOFOL  N/A 01/12/2018   Procedure: ESOPHAGOGASTRODUODENOSCOPY (EGD) WITH BIOPSIES;  Surgeon: Jinny Carmine, MD;  Location: [facility];  Service: Endoscopy;  Laterality: N/A;   ESOPHAGOGASTRODUODENOSCOPY (EGD) WITH PROPOFOL  N/A 01/25/2018   Procedure: ESOPHAGOGASTRODUODENOSCOPY (EGD) WITH PROPOFOL ;  Surgeon: Therisa Bi, MD;  Location: [facility];  Service: Gastroenterology;  Laterality: N/A;   ESOPHAGOGASTRODUODENOSCOPY (EGD) WITH PROPOFOL  N/A  02/27/2018   Procedure: ESOPHAGOGASTRODUODENOSCOPY (EGD) WITH PROPOFOL ;  Surgeon: Jinny Carmine, MD;  Location: [facility];  Service: Endoscopy;  Laterality: N/A;   ESOPHAGOGASTRODUODENOSCOPY (EGD) WITH PROPOFOL  N/A 05/09/2021   Procedure: ESOPHAGOGASTRODUODENOSCOPY (EGD) WITH PROPOFOL ;  Surgeon: Onita Elspeth Sharper, DO;  Location: [facility];  Service: Gastroenterology;  Laterality: N/A;    ESOPHAGOGASTRODUODENOSCOPY (EGD) WITH PROPOFOL  N/A 03/09/2022   Procedure: ESOPHAGOGASTRODUODENOSCOPY (EGD) WITH PROPOFOL ;  Surgeon: Jinny Carmine, MD;  Location: [facility];  Service: Endoscopy;  Laterality: N/A;   ESOPHAGOGASTRODUODENOSCOPY (EGD) WITH PROPOFOL  N/A 03/30/2023   Procedure: ESOPHAGOGASTRODUODENOSCOPY (EGD) WITH PROPOFOL ;  Surgeon: Jinny Carmine, MD;  Location: [facility] END[facility]COPY;  Service: Endoscopy;  Laterality: N/A;   GASTROJEJUNOSTOMY N/A 05/12/2023   Procedure: GASTROJEJUNOSTOMY, revision, Arthea Platt, PA-C to assist, possible feeding jejunostomy;  Surgeon: Desiderio Schanz, MD;  Location: [facility] OR[facility];  Service: General;  Laterality: N/A;   INCISIONAL HERNIA REPAIR N/A 11/23/2018   Procedure: HERNIA REPAIR INCISIONAL;  Surgeon: Desiderio Schanz, MD;  Location: [facility];  Service: General;  Laterality: N/A;   INSERTION OF MESH N/A 11/23/2018   Procedure: INSERTION OF MESH;  Surgeon: Desiderio Schanz, MD;  Location: [facility];  Service: General;  Laterality: N/A;   IR GASTROSTOMY TUBE MOD SED  05/30/2023   IR REPLC DUODEN/JEJUNO TUBE PERCUT W/FLUORO  06/07/2023   IR REPLC DUODEN/JEJUNO TUBE PERCUT W/FLUORO  06/21/2023   IR REPLC GASTRO/COLONIC TUBE PERCUT W/FLUORO  06/17/2023   LAPAROTOMY N/A 01/20/2018   Procedure: EXPLORATORY LAPAROTOMY;  Surgeon: Desiderio Schanz, MD;  Location: [facility] OR[facility];  Service: General;  Laterality: N/A;   LYSIS OF ADHESION N/A 05/12/2023   Procedure: LYSIS OF ADHESION, open, Arthea Platt, PA-C to assist;  Surgeon: Desiderio Schanz, MD;  Location: [facility] OR[facility];  Service: General;  Laterality: N/A;   TOOTH EXTRACTION      Home Medications: Prior to Admission medications   Medication Sig Start Date End Date Taking? Authorizing Provider  acetaminophen  (TYLENOL ) 500 MG tablet Take 500-1,000 mg by mouth every 6 (six) hours as needed for moderate pain (pain score 4-6) or headache.   Yes [provider]  ARIPiprazole  (ABILIFY ) 2 MG tablet Take 1 tablet (2 mg total) by mouth at  bedtime. 09/19/23 12/18/23 Yes Vickey Mettle, MD  levothyroxine  (SYNTHROID ) 75 MCG tablet Take 1 tablet by mouth every morning on an empty stomach with water  only.  No food or other medications for 30 minutes. 07/18/23  Yes Gretta Comer POUR, NP  mirtazapine  (REMERON ) 15 MG tablet Take 1 tablet (15 mg total) by mouth at bedtime. 08/31/23 11/29/23 Yes Hisada, Reina, MD  Multiple Vitamin (MULTIVITAMIN WITH MINERALS) TABS tablet Take 1 tablet by mouth daily. 04/01/23  Yes Fausto Sor A, DO  ondansetron  (ZOFRAN -ODT) 4 MG disintegrating tablet Take 1 tablet (4 mg total) by mouth every 8 (eight) hours as needed for nausea. 07/01/23  Yes Clark, Katherine K, NP  oxybutynin  (DITROPAN -XL) 5 MG 24 hr tablet TAKE 1 TABLET (5 MG TOTAL) BY MOUTH AT BEDTIME. FOR BLADDER INCONTINENCE. 01/21/23  Yes Clark, Katherine K, NP  pantoprazole  (PROTONIX ) 40 MG tablet Take 1 tablet (40 mg total) by mouth daily. 05/04/23 04/28/24 Yes [place], PA-C  ramelteon  (ROZEREM ) 8 MG tablet Take 1 tablet (8 mg total) by mouth at bedtime as needed for sleep. 09/19/23 11/18/23 Yes Hisada, Mettle, MD  SUMAtriptan  (IMITREX ) 50 MG tablet Take 1 tablet at migraine onset. May repeat in 2 hours if headache persists or recurs. 03/08/23  Yes Gretta Comer POUR, NP  traMADol  (ULTRAM ) 50  MG tablet Take 1 tablet (50 mg total) by mouth every 12 (twelve) hours as needed. Must last 30 days. 08/29/23  Yes Patel, Seema K, NP    Allergies: Allergies  Allergen Reactions   Penicillins Shortness Of Breath, Diarrhea and Nausea And Vomiting    TOLERATED CEPHALOSPORINS   Nsaids Other (See Comments)    Pt states NSAIDS cause internal bleeding   Gabapentin  Other (See Comments)    Hair Loss    Pregabalin      Tremors   Sulfa Antibiotics Nausea And Vomiting    Review of Systems: Review of Systems  Constitutional:  Negative for chills and fever.  Respiratory:  Negative for shortness of breath.   Cardiovascular:  Negative for chest pain.   Gastrointestinal:  Positive for abdominal pain (at her tube sites). Negative for nausea and vomiting.    Physical Exam BP 107/77   Pulse 73   Temp 98.3 F (36.8 C) (Oral)   Ht 4' 11 (1.499 m)   Wt 92 lb (41.7 kg)   SpO2 97%   BMI 18.58 kg/m  CONSTITUTIONAL: No acute distress HEENT:  Normocephalic, atraumatic, extraocular motion intact. RESPIRATORY:  Normal respiratory effort without pathologic use of accessory muscles. CARDIOVASCULAR: Regular rhythm and rate GI: The abdomen is soft, nondistended, with localized tenderness at both jejunostomy tube and gastrostomy tube.  On further inspection, the balloon for the jejunostomy tube has retracted back to the skin level which is creating skin erosion with the now wound size of about 1.5 cm which is the size of the balloon.  The balloon was deflated and only contain 4 mL instead of the recommended 7-9 mL.  After deflating the balloon, the tube was pushed further into the small bowel and the balloon was reinflated using 8 mL of saline.  The tube was then retracted and it was appropriately snug against the bowel wall.  The silicone disc was cinched down to the 5 cm mark.  Drain sponge dressing placed.  The gastrostomy tube was pulled out without complications.  Small amount of gastric contents came out as expected.  Small amount of oozing from the skin was noted but stopped on its own.  Dry gauze dressing applied to this.  NEUROLOGIC:  Motor and sensation is grossly normal.  Cranial nerves are grossly intact. PSYCH:  Alert and oriented to person, place and time. Affect is normal.  Labs/Imaging: Small bowel follow-through on 09/06/2023: IMPRESSION: Postoperative changes related to prior gastric bypass with no evidence of postoperative leak or extraluminal contrast. Contrast is observed transiting from the stomach through the distal small bowel without complication.  Assessment and Plan: This is a 58 y.o. female  s/p exlap, excision of prior loop  gastrojejunostomy and creation of new RNY type gastrojejunostomy with feeding jejunostomy   -Overall the patient has continued to gain weight and now weighs 92 pounds.  After discussing with [name], she recommends continuing her tube feeds until she reaches a UBW of approximately 110 pounds.  Patient can continue her regular diet as tolerated.  I have also printed all the recommendations for the vitamins and supplementations to take. - Given that the small bowel follow-through study did not show any retained contrast or dilated stomach or small bowel, her gastrostomy tube was pulled out today in the office without complications.  Instructed the patient that the wound will start closing on its own and she can apply a dry gauze dressing to the area until it is fully healed. - The jejunostomy tube  was repositioned successfully today in the office.  Discussed with the patient that the larger skin wound that resulted from the balloon eroding into the skin will start healing on its own and start collapsing again against the tube.  This may take some time but continue dry gauze dressing changes in the meantime. - Follow-up with me in about 6 weeks.  I spent 20 minutes dedicated to the care of this patient on the date of this encounter to include pre-visit review of records, face-to-face time with the patient discussing diagnosis and management, and any post-visit coordination of care.   Aloysius Sheree Plant, MD [facility]

## 2023-09-26 ENCOUNTER — Telehealth: Payer: Self-pay | Admitting: Primary Care

## 2023-09-26 DIAGNOSIS — K315 Obstruction of duodenum: Secondary | ICD-10-CM | POA: Diagnosis not present

## 2023-09-26 DIAGNOSIS — R790 Abnormal level of blood mineral: Secondary | ICD-10-CM

## 2023-09-26 NOTE — Telephone Encounter (Signed)
 Copied from CRM [id number]. Topic: Clinical - Request for Lab/Test Order >> Sep 26, 2023  2:32 PM [place] J wrote: Reason for CRM: Patient is needing an order for her copper  (per Desiderio Schanz MD) and thyroid  levels checked. She already has an appointment this Friday for labs.

## 2023-09-26 NOTE — Addendum Note (Signed)
 Addended by: Everette Dimauro K on: 09/26/2023 06:14 PM   Modules accepted: Orders

## 2023-09-26 NOTE — Telephone Encounter (Signed)
 Noted. Lab for TSH already entered. Will add copper  level

## 2023-09-29 ENCOUNTER — Other Ambulatory Visit: Payer: Self-pay

## 2023-09-29 NOTE — Patient Outreach (Signed)
 Complex Care Management   Visit Note  09/29/2023  Name:  Tanya Nguyen MRN: [id number] DOB: 1965/05/20  Situation: Referral received for Complex Care Management related to Nutrition I obtained verbal consent from Patient.  Visit completed with Patient  on the phone  Background:   Past Medical History:  Diagnosis Date   Acquired pyloric stricture    Bipolar disorder (HCC)    Bleeding duodenal ulcer    CAP (community acquired pneumonia) 02/12/2020   Chronic esophagogastric ulcer    Chronic low back pain    Chronic pain syndrome    Coffee ground emesis 03/29/2023   Constipation 04/02/2014   Depression    Duodenal obstruction    Gastric outlet obstruction 01/19/2018   GERD (gastroesophageal reflux disease)    Hair loss 09/22/2018   Head injury 12/24/2022   History of stomach ulcers    Hypothyroidism    IDA (iron  deficiency anemia)    Incisional hernia, without obstruction or gangrene    Intractable vomiting    Kidney stones    Melena 05/08/2021   Migraine    Neuropathy    Nodule of middle lobe of right lung 09/17/2021   NSAID long-term use 09/16/2016   Pre-diabetes    Protein-calorie malnutrition, severe (HCC)    PTSD (post-traumatic stress disorder)    Right-sided chest pain 09/17/2021   Sacroiliac joint pain 08/14/2018   Side pain 03/30/2018   Small bowel anastomotic dilation    Thrombocytosis    Upper GI bleed 05/18/2021   Vitamin B12 deficiency    Vitamin D  deficiency     Assessment: Patient Reported Symptoms:  Cognitive Cognitive Status: No symptoms reported Cognitive/Intellectual Conditions Management [RPT]: None reported or documented in medical history or problem list   Health Maintenance Behaviors: Annual physical exam, Sleep adequate Healing Pattern: Average Health Facilitated by: Pain control, Rest  Neurological Neurological Review of Symptoms: No symptoms reported Neurological Comment: patient reports no migraines recently  HEENT HEENT Symptoms  Reported: No symptoms reported HEENT Management Strategies: Routine screening    Cardiovascular Cardiovascular Symptoms Reported: No symptoms reported Does patient have uncontrolled Hypertension?: No Cardiovascular Management Strategies: Routine screening Weight: 92 lb 3.2 oz (41.8 kg)  Respiratory Respiratory Symptoms Reported: No symptoms reported Respiratory Management Strategies: Routine screening  Endocrine Endocrine Symptoms Reported: No symptoms reported Is patient diabetic?: No Endocrine Comment: repeat thyroid  labs tomorrow  Gastrointestinal Additional Gastrointestinal Details: G-tube removed, site healing no signs of infection, J tube site no signs of infection, tolerating tube feedings overnight, and has smoothies with protein powder during day - discussed protein shakes available Gastrointestinal Management Strategies: Incontinence garment/pad, Medication therapy, Nutrition support Enteral Nutrition Time Frame: Cyclic Parenteral Nutrition Method of Administration: Cyclic Parenteral Nutrition Schedule: overnight Gastrointestinal Comment: reports tolerating well no n/v Nutrition Risk Screen (CP): Tube feeding or parenteral nutrition  Genitourinary Genitourinary Symptoms Reported: No symptoms reported Genitourinary Management Strategies: Medication therapy  Integumentary Integumentary Symptoms Reported: No symptoms reported Additional Integumentary Details: Tube sites WNL Skin Management Strategies: Dressing changes, Routine screening  Musculoskeletal Musculoskelatal Symptoms Reviewed: No symptoms reported Musculoskeletal Management Strategies: Medication therapy, Routine screening Falls in the past year?: No Number of falls in past year: 1 or less Was there an injury with Fall?: No Fall Risk Category Calculator: 0 Patient Fall Risk Level: Low Fall Risk Patient at Risk for Falls Due to: History of fall(s) Fall risk Follow up: Falls evaluation completed  Psychosocial  Psychosocial Symptoms Reported: Sadness - if selected complete PHQ 2-9 Additional Psychological Details: reports gets  down about medical condition and not being able to do things as before - improving Behavioral Management Strategies: Medication therapy, Adequate rest, Support system Behavioral Health Comment: no longer talks with therapist - discusses with PCP prn, psychologist/psychiatrist prn Major Change/Loss/Stressor/Fears (CP): Medical condition, self Techniques to Cope with Loss/Stress/Change: Spiritual practice(s) Quality of Family Relationships: helpful, involved, supportive Do you feel physically threatened by others?: No      09/29/2023   10:21 AM  Depression screen PHQ 2/9  Decreased Interest 0  Down, Depressed, Hopeless 0  PHQ - 2 Score 0  Altered sleeping 0  Tired, decreased energy 1  Change in appetite 0  Feeling bad or failure about yourself  1  Trouble concentrating 0  Suicidal thoughts 0  PHQ-9 Score 2    There were no vitals filed for this visit.  Medications Reviewed Today     Reviewed by Devra Lands, RN (Registered Nurse) on 09/29/23 at 1008  Med List Status: <None>   Medication Order Taking? Sig Documenting Provider Last Dose Status Informant  acetaminophen  (TYLENOL ) 500 MG tablet [id number] Yes Take 500-1,000 mg by mouth every 6 (six) hours as needed for moderate pain (pain score 4-6) or headache. [provider]  Active Other, Pharmacy Records, Self           Med Note CATHY OVAL DEL   Dju May 28, 2023  1:34 PM) prn  ARIPiprazole  (ABILIFY ) 2 MG tablet [id number] Yes Take 1 tablet (2 mg total) by mouth at bedtime. Vickey Mettle, MD  Active   levothyroxine  (SYNTHROID ) 75 MCG tablet [id number] Yes Take 1 tablet by mouth every morning on an empty stomach with water  only.  No food or other medications for 30 minutes. Gretta Comer POUR, NP  Active   mirtazapine  (REMERON ) 15 MG tablet [id number] Yes Take 1 tablet (15 mg total) by mouth at bedtime.   Patient taking differently: Take 15 mg by mouth at bedtime. Per patient provider increased to 30 mg due to poor sleep (psychologist)   Hisada, Reina, MD  Active   Multiple Vitamin (MULTIVITAMIN WITH MINERALS) TABS tablet [id number] Yes Take 1 tablet by mouth daily. Fausto Burnard LABOR, DO  Active Other, Pharmacy Records, Self  ondansetron  (ZOFRAN -ODT) 4 MG disintegrating tablet [id number] Yes Take 1 tablet (4 mg total) by mouth every 8 (eight) hours as needed for nausea. Clark, Katherine K, NP  Active   oxybutynin  (DITROPAN -XL) 5 MG 24 hr tablet [id number] Yes TAKE 1 TABLET (5 MG TOTAL) BY MOUTH AT BEDTIME. FOR BLADDER INCONTINENCE. Gretta Comer POUR, NP  Active Other, Pharmacy Records, Self  pantoprazole  (PROTONIX ) 40 MG tablet [id number] Yes Take 1 tablet (40 mg total) by mouth daily. [place], PA-C  Active Other, Pharmacy Records, Self           Med Note (STEFFENS, MICHELLE P   Thu Jul 07, 2023 10:21 AM)    ramelteon  (ROZEREM ) 8 MG tablet [id number]  Take 1 tablet (8 mg total) by mouth at bedtime as needed for sleep.  Patient not taking: Reported on 09/29/2023   Hisada, Reina, MD  Active   SUMAtriptan  (IMITREX ) 50 MG tablet [id number] Yes Take 1 tablet at migraine onset. May repeat in 2 hours if headache persists or recurs. Gretta Comer POUR, NP  Active Other, Pharmacy Records, Self           Med Note CATHY OVAL DEL   Dju May 28, 2023  1:34 PM) prn  traMADol  (ULTRAM ) 50 MG tablet [id number] Yes Take 1  tablet (50 mg total) by mouth every 12 (twelve) hours as needed. Must last 30 days. Patel, Seema K, NP  Active   Med List Note Geanie Wolm HERO, RN 08/29/23 1140): Patient discharged from [facility] 05/27/23 Pain contract signed 09/17/21 PA for Tramadol  done via [facility]  Key [id number]  kt MR 02-24-2022             Recommendation:   PCP Follow-up Continue Current Plan of Care  Follow Up Plan:   Telephone follow-up in 1 month  Nestora Duos, MSN, RN [facility]  [facility],  [facility] RN Care Manager Direct Dial: [phone] Fax: [phone]

## 2023-09-29 NOTE — Patient Instructions (Signed)
 Visit Information  Thank you for taking time to visit with me today. Please don't hesitate to contact me if I can be of assistance to you before our next scheduled appointment.  Your next care management appointment is by telephone on 10/27/23 at 10:30 am  Telephone follow-up in 1 month  Please call the care guide team at [phone] if you need to cancel, schedule, or reschedule an appointment.   Please call the Suicide and Crisis Lifeline: 988 call the [place]  National Suicide Prevention Lifeline: (980) 854-0861 or TTY: (254)433-7918 TTY 986-748-4175) to talk to a trained counselor call 1-800-273-TALK (toll free, 24 hour hotline) go to [facility] [address] [place] [phone]) call 911 if you are experiencing a Mental Health or Behavioral Health Crisis or need someone to talk to.  Nestora Duos, MSN, RN [facility], [facility] Health RN Care Manager Direct Dial: [phone] Fax: [phone]

## 2023-09-30 ENCOUNTER — Other Ambulatory Visit

## 2023-09-30 DIAGNOSIS — E038 Other specified hypothyroidism: Secondary | ICD-10-CM

## 2023-09-30 DIAGNOSIS — R790 Abnormal level of blood mineral: Secondary | ICD-10-CM | POA: Diagnosis not present

## 2023-09-30 LAB — TSH: TSH: 1.03 u[IU]/mL (ref 0.35–5.50)

## 2023-10-04 LAB — COPPER, SERUM: Copper: 72 ug/dL (ref 70–175)

## 2023-10-05 ENCOUNTER — Ambulatory Visit: Payer: Self-pay | Admitting: Primary Care

## 2023-10-05 ENCOUNTER — Ambulatory Visit: Payer: Self-pay

## 2023-10-05 NOTE — Telephone Encounter (Signed)
 Note added to result management

## 2023-10-05 NOTE — Telephone Encounter (Signed)
 Copied from CRM [id number]. Topic: Clinical - Lab/Test Results >> Oct 05, 2023  3:04 PM Paige D wrote: Reason for CRM: Pt calling back due to missing a call from [facility]. PT would like a call back from Angel Weedon when available.

## 2023-10-05 NOTE — Telephone Encounter (Signed)
 FYI Only or Action Required?: FYI only for provider. Patient asking if she needs to continue taking her copper  pills. RN advised her to continue taking her copper  pills until PCP tells her otherwise.   Patient was last seen in primary care on 07/01/2023 by Gretta Comer POUR, NP.  Called Nurse Triage reporting Lab Results.  Symptoms began No symptoms.  Interventions attempted: Nothing.  Symptoms are: stable.  Triage Disposition: Information or Advice Only Call-   Patient/caregiver understands and will follow disposition?: Yes Copied from CRM [id number]. Topic: Clinical - Lab/Test Results >> Oct 05, 2023 10:09 AM Tanya Nguyen wrote: Reason for CRM: Patient would like to know what Nguyen CPE is and more information regarding labs Reason for Disposition  [1] Other NON-URGENT information for PCP AND [2] does not require PCP response  Answer Assessment - Initial Assessment Questions 1. REASON FOR CALL or QUESTION: What is your reason for calling today? or How can I best     Patient returning call for lab results  2. [name]: Document the source of call. (e.g., laboratory staff, caregiver or patient).     patient  Protocols used: PCP Call - No Triage-Nguyen-AH

## 2023-10-15 ENCOUNTER — Other Ambulatory Visit: Payer: Self-pay | Admitting: Psychiatry

## 2023-10-16 ENCOUNTER — Other Ambulatory Visit: Payer: Self-pay | Admitting: Primary Care

## 2023-10-16 DIAGNOSIS — E038 Other specified hypothyroidism: Secondary | ICD-10-CM

## 2023-10-17 ENCOUNTER — Telehealth: Payer: Self-pay

## 2023-10-17 NOTE — Telephone Encounter (Signed)
 Pt is aware that per [facility] has refills available

## 2023-10-17 NOTE — Telephone Encounter (Signed)
 Per pt  would like a refill on Pantoprazole  40 mg. [facility] [address] [facility]

## 2023-10-20 ENCOUNTER — Other Ambulatory Visit: Payer: Self-pay | Admitting: Psychiatry

## 2023-10-20 ENCOUNTER — Telehealth: Payer: Self-pay | Admitting: Psychiatry

## 2023-10-20 MED ORDER — MIRTAZAPINE 30 MG PO TABS: 30.0000 mg | ORAL_TABLET | Freq: Every day | ORAL | 0 refills | Status: DC

## 2023-10-20 NOTE — Telephone Encounter (Signed)
 The medication has been ordered. I believe she was previously taking two tablets of 15 mg mirtazapine . The new prescription is for 30 mg, so please advise her to take one tablet at night.

## 2023-10-20 NOTE — Telephone Encounter (Signed)
 Patient messages left on nurse line stating she is almost out of medication with under a week left. It is the Mirtazatine and states it should be 30 mg. Please advise
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
## 2023-10-27 ENCOUNTER — Telehealth: Payer: Self-pay

## 2023-10-27 ENCOUNTER — Ambulatory Visit (INDEPENDENT_AMBULATORY_CARE_PROVIDER_SITE_OTHER): Admitting: Psychiatry

## 2023-10-27 ENCOUNTER — Encounter: Payer: Self-pay | Admitting: Psychiatry

## 2023-10-27 VITALS — BP 118/80 | HR 103 | Temp 98.7°F | Ht 59.0 in | Wt 97.0 lb

## 2023-10-27 DIAGNOSIS — K315 Obstruction of duodenum: Secondary | ICD-10-CM | POA: Diagnosis not present

## 2023-10-27 DIAGNOSIS — G4700 Insomnia, unspecified: Secondary | ICD-10-CM

## 2023-10-27 DIAGNOSIS — F4310 Post-traumatic stress disorder, unspecified: Secondary | ICD-10-CM

## 2023-10-27 DIAGNOSIS — F3341 Major depressive disorder, recurrent, in partial remission: Secondary | ICD-10-CM

## 2023-10-27 MED ORDER — ARIPIPRAZOLE 2 MG PO TABS: 2.0000 mg | ORAL_TABLET | Freq: Every day | ORAL | 0 refills | Status: DC

## 2023-10-27 NOTE — Patient Instructions (Signed)
 Continue mirtazapine  30 mg at night  Continue Abilify  2 mg at night  Next appointment: 10/23 at 11:30

## 2023-10-27 NOTE — Patient Instructions (Signed)
 Sharlet Getting - I am sorry I was unable to reach you today for our scheduled appointment. I work with Gretta Comer POUR, NP and am calling to support your healthcare needs. Please contact me at [phone] at your earliest convenience. I look forward to speaking with you soon.   Thank you,  Nestora Duos, MSN, RN [facility]  [facility], [facility] RN Care Manager Direct Dial: [phone] Fax: [phone]

## 2023-11-03 ENCOUNTER — Other Ambulatory Visit: Payer: Self-pay | Admitting: Surgery

## 2023-11-04 ENCOUNTER — Ambulatory Visit: Admitting: Surgery

## 2023-11-04 ENCOUNTER — Encounter: Payer: Self-pay | Admitting: Surgery

## 2023-11-04 VITALS — BP 112/75 | HR 91 | Temp 97.9°F | Ht 59.0 in | Wt 95.0 lb

## 2023-11-04 DIAGNOSIS — Z434 Encounter for attention to other artificial openings of digestive tract: Secondary | ICD-10-CM

## 2023-11-04 DIAGNOSIS — K315 Obstruction of duodenum: Secondary | ICD-10-CM | POA: Diagnosis not present

## 2023-11-04 DIAGNOSIS — Z934 Other artificial openings of gastrointestinal tract status: Secondary | ICD-10-CM | POA: Diagnosis not present

## 2023-11-04 DIAGNOSIS — Z980 Intestinal bypass and anastomosis status: Secondary | ICD-10-CM

## 2023-11-04 DIAGNOSIS — E43 Unspecified severe protein-calorie malnutrition: Secondary | ICD-10-CM | POA: Diagnosis not present

## 2023-11-04 NOTE — Patient Instructions (Addendum)
 Continue to do the Nightly feeds through your tube. We will see you back in a month for a follow up. Please call the office if you have any questions or concerns

## 2023-11-04 NOTE — Progress Notes (Signed)
 11/04/2023  History of Present Illness: Tanya Nguyen is a 58 y.o. female  s/p exploratory laparotomy, excision of prior loop gastrojejunostomy, and creating of new RNY type gastrojejunostoy with a feeding distal jejunostomy tube placement on 05/12/23.  She also required a venting G tube placed by IR on 05/30/23.  She was last seen on 09/23/23.  At that time, her G tube was removed.  Her J tube also needed repositioning as the balloon can become dislodged.  She continues on a regular diet and tube feeds at night.  Denies any abdominal pain, bloatedness, or nausea.  The G tube wound is healing and is almost healed.  She was 92 lbs on her last visit and is 95 lbs today, with a goal of 110 lbs.   Past Medical History: Past Medical History:  Diagnosis Date   Acquired pyloric stricture    Bipolar disorder (HCC)    Bleeding duodenal ulcer    CAP (community acquired pneumonia) 02/12/2020   Chronic esophagogastric ulcer    Chronic low back pain    Chronic pain syndrome    Coffee ground emesis 03/29/2023   Constipation 04/02/2014   Depression    Duodenal obstruction    Gastric outlet obstruction 01/19/2018   GERD (gastroesophageal reflux disease)    Hair loss 09/22/2018   Head injury 12/24/2022   History of stomach ulcers    Hypothyroidism    IDA (iron  deficiency anemia)    Incisional hernia, without obstruction or gangrene    Intractable vomiting    Kidney stones    Melena 05/08/2021   Migraine    Neuropathy    Nodule of middle lobe of right lung 09/17/2021   NSAID long-term use 09/16/2016   Pre-diabetes    Protein-calorie malnutrition, severe (HCC)    PTSD (post-traumatic stress disorder)    Right-sided chest pain 09/17/2021   Sacroiliac joint pain 08/14/2018   Side pain 03/30/2018   Small bowel anastomotic dilation    Thrombocytosis    Upper GI bleed 05/18/2021   Vitamin B12 deficiency    Vitamin D  deficiency      Past Surgical History: Past Surgical History:  Procedure  Laterality Date   ABDOMINAL HYSTERECTOMY  03/08/2002   partial   bypass gastrojejunostomy     CHOLECYSTECTOMY     COLONOSCOPY WITH PROPOFOL  N/A 02/27/2018   Procedure: COLONOSCOPY WITH PROPOFOL ;  Surgeon: Jinny Carmine, MD;  Location: [facility];  Service: Endoscopy;  Laterality: N/A;   ESOPHAGOGASTRODUODENOSCOPY (EGD) WITH PROPOFOL  N/A 01/12/2018   Procedure: ESOPHAGOGASTRODUODENOSCOPY (EGD) WITH BIOPSIES;  Surgeon: Jinny Carmine, MD;  Location: [facility];  Service: Endoscopy;  Laterality: N/A;   ESOPHAGOGASTRODUODENOSCOPY (EGD) WITH PROPOFOL  N/A 01/25/2018   Procedure: ESOPHAGOGASTRODUODENOSCOPY (EGD) WITH PROPOFOL ;  Surgeon: Therisa Bi, MD;  Location: [facility] ENDOSCOPY;  Service: Gastroenterology;  Laterality: N/A;   ESOPHAGOGASTRODUODENOSCOPY (EGD) WITH PROPOFOL  N/A 02/27/2018   Procedure: ESOPHAGOGASTRODUODENOSCOPY (EGD) WITH PROPOFOL ;  Surgeon: Jinny Carmine, MD;  Location: [facility];  Service: Endoscopy;  Laterality: N/A;   ESOPHAGOGASTRODUODENOSCOPY (EGD) WITH PROPOFOL  N/A 05/09/2021   Procedure: ESOPHAGOGASTRODUODENOSCOPY (EGD) WITH PROPOFOL ;  Surgeon: Onita Elspeth Sharper, DO;  Location: [facility] END[facility];  Service: Gastroenterology;  Laterality: N/A;   ESOPHAGOGASTRODUODENOSCOPY (EGD) WITH PROPOFOL  N/A 03/09/2022   Procedure: ESOPHAGOGASTRODUODENOSCOPY (EGD) WITH PROPOFOL ;  Surgeon: Jinny Carmine, MD;  Location: [facility] ENDOSCOPY;  Service: Endoscopy;  Laterality: N/A;   ESOPHAGOGASTRODUODENOSCOPY (EGD) WITH PROPOFOL  N/A 03/30/2023   Procedure: ESOPHAGOGASTRODUODENOSCOPY (EGD) WITH PROPOFOL ;  Surgeon: Jinny Carmine, MD;  Location: [facility] ENDOSCOPY;  Service:  Endoscopy;  Laterality: N/A;   GASTROJEJUNOSTOMY N/A 05/12/2023   Procedure: GASTROJEJUNOSTOMY, revision, Arthea Platt, PA-C to assist, possible feeding jejunostomy;  Surgeon: Desiderio Schanz, MD;  Location: [facility] ORS;  Service: General;  Laterality: N/A;   INCISIONAL HERNIA REPAIR N/A 11/23/2018   Procedure: HERNIA  REPAIR INCISIONAL;  Surgeon: Desiderio Schanz, MD;  Location: [facility] ORS;  Service: General;  Laterality: N/A;   INSERTION OF MESH N/A 11/23/2018   Procedure: INSERTION OF MESH;  Surgeon: Desiderio Schanz, MD;  Location: [facility] ORS;  Service: General;  Laterality: N/A;   IR GASTROSTOMY TUBE MOD SED  05/30/2023   IR REPLC DUODEN/JEJUNO TUBE PERCUT W/FLUORO  06/07/2023   IR REPLC DUODEN/JEJUNO TUBE PERCUT W/FLUORO  06/21/2023   IR REPLC GASTRO/COLONIC TUBE PERCUT W/FLUORO  06/17/2023   LAPAROTOMY N/A 01/20/2018   Procedure: EXPLORATORY LAPAROTOMY;  Surgeon: Desiderio Schanz, MD;  Location: [facility] ORS;  Service: General;  Laterality: N/A;   LYSIS OF ADHESION N/A 05/12/2023   Procedure: LYSIS OF ADHESION, open, Arthea Platt, PA-C to assist;  Surgeon: Desiderio Schanz, MD;  Location: [facility] ORS;  Service: General;  Laterality: N/A;   TOOTH EXTRACTION      Home Medications: Prior to Admission medications   Medication Sig Start Date End Date Taking? Authorizing Provider  acetaminophen  (TYLENOL ) 500 MG tablet Take 500-1,000 mg by mouth every 6 (six) hours as needed for moderate pain (pain score 4-6) or headache.   Yes [provider]  ARIPiprazole  (ABILIFY ) 2 MG tablet Take 1 tablet (2 mg total) by mouth at bedtime. 09/19/23 12/18/23 Yes Vickey Mettle, MD  levothyroxine  (SYNTHROID ) 75 MCG tablet Take 1 tablet by mouth every morning on an empty stomach with water  only.  No food or other medications for 30 minutes. 07/18/23  Yes Clark, Katherine K, NP  mirtazapine  (REMERON ) 15 MG tablet Take 1 tablet (15 mg total) by mouth at bedtime. 08/31/23 11/29/23 Yes Hisada, Reina, MD  Multiple Vitamin (MULTIVITAMIN WITH MINERALS) TABS tablet Take 1 tablet by mouth daily. 04/01/23  Yes Fausto Sor A, DO  ondansetron  (ZOFRAN -ODT) 4 MG disintegrating tablet Take 1 tablet (4 mg total) by mouth every 8 (eight) hours as needed for nausea. 07/01/23  Yes Clark, Katherine K, NP  oxybutynin  (DITROPAN -XL) 5 MG 24 hr tablet TAKE 1 TABLET (5  MG TOTAL) BY MOUTH AT BEDTIME. FOR BLADDER INCONTINENCE. 01/21/23  Yes Clark, Katherine K, NP  pantoprazole  (PROTONIX ) 40 MG tablet Take 1 tablet (40 mg total) by mouth daily. 05/04/23 04/28/24 Yes [place], PA-C  ramelteon  (ROZEREM ) 8 MG tablet Take 1 tablet (8 mg total) by mouth at bedtime as needed for sleep. 09/19/23 11/18/23 Yes Hisada, Mettle, MD  SUMAtriptan  (IMITREX ) 50 MG tablet Take 1 tablet at migraine onset. May repeat in 2 hours if headache persists or recurs. 03/08/23  Yes Clark, Katherine K, NP  traMADol  (ULTRAM ) 50 MG tablet Take 1 tablet (50 mg total) by mouth every 12 (twelve) hours as needed. Must last 30 days. 08/29/23  Yes Patel, Seema K, NP    Allergies: Allergies  Allergen Reactions   Penicillins Shortness Of Breath, Diarrhea and Nausea And Vomiting    TOLERATED CEPHALOSPORINS   Nsaids Other (See Comments)    Pt states NSAIDS cause internal bleeding   Gabapentin  Other (See Comments)    Hair Loss    Pregabalin      Tremors   Sulfa Antibiotics Nausea And Vomiting    Review of Systems: Review of Systems  Constitutional:  Negative for chills and fever.  Respiratory:  Negative for shortness of breath.   Cardiovascular:  Negative for chest pain.  Gastrointestinal:  Negative for abdominal pain, nausea and vomiting.    Physical Exam BP 112/75   Pulse 91   Temp 97.9 F (36.6 C) (Oral)   Ht 4' 11 (1.499 m)   Wt 95 lb (43.1 kg)   SpO2 98%   BMI 19.19 kg/m  CONSTITUTIONAL: No acute distress HEENT:  Normocephalic, atraumatic, extraocular motion intact. RESPIRATORY:  Normal respiratory effort without pathologic use of accessory muscles. CARDIOVASCULAR: Regular rhythm and rate GI: The abdomen is soft, nondistended, non-tender to palpation.  G tube site is almost healed, with a small amount of exposed tissue.  This was treated with silver  nitrate and dry gauze dressing applied.  J tube site is healing appropriately around the tube.  NEUROLOGIC:  Motor and  sensation is grossly normal.  Cranial nerves are grossly intact. PSYCH:  Alert and oriented to person, place and time. Affect is normal.  Assessment and Plan: This is a 58 y.o. female  s/p exlap, excision of prior loop gastrojejunostomy and creation of new RNY type gastrojejunostomy with feeding jejunostomy   --Patient continues to do well and is slowly gaining weight.  Continue on her tube feeds at night and regular diet during the day.  G tube site treated with silver  nitrate to help with the healing process.   --Follow up in 1 month so we can recheck her nutrition labs.  I spent 20 minutes dedicated to the care of this patient on the date of this encounter to include pre-visit review of records, face-to-face time with the patient discussing diagnosis and management, and any post-visit coordination of care.   Aloysius Sheree Plant, MD [facility]

## 2023-11-08 ENCOUNTER — Other Ambulatory Visit: Payer: Self-pay

## 2023-11-08 NOTE — Patient Instructions (Signed)
 Visit Information  Thank you for taking time to visit with me today. Please don't hesitate to contact me if I can be of assistance to you before our next scheduled appointment.  Your next care management appointment is by telephone on 12/20/2023 at 11:30 am  Telephone follow-up 6 [name]  Please call the care guide team at [phone] if you need to cancel, schedule, or reschedule an appointment.   Please call the Suicide and Crisis Lifeline: 988 call the [place]  National Suicide Prevention Lifeline: 850-167-9281 or TTY: 670-635-8141 TTY 620 213 7245) to talk to a trained counselor call 1-800-273-TALK (toll free, 24 hour hotline) go to [facility] [address], [place] [phone]) call 911 if you are experiencing a Mental Health or Behavioral Health Crisis or need someone to talk to.  Nestora Duos, MSN, RN [facility], [facility] RN Care Manager Direct Dial: [phone] Fax: [phone]

## 2023-11-08 NOTE — Patient Outreach (Signed)
 Complex Care Management   Visit Note  11/08/2023  Name:  Tanya Nguyen MRN: [id number] DOB: 17-Nov-1965  Situation: Referral received for Complex Care Management related to Malnutrition I obtained verbal consent from Patient.  Visit completed with Patient  on the phone  Background:   Past Medical History:  Diagnosis Date   Acquired pyloric stricture    Bipolar disorder (HCC)    Bleeding duodenal ulcer    CAP (community acquired pneumonia) 02/12/2020   Chronic esophagogastric ulcer    Chronic low back pain    Chronic pain syndrome    Coffee ground emesis 03/29/2023   Constipation 04/02/2014   Depression    Duodenal obstruction    Gastric outlet obstruction 01/19/2018   GERD (gastroesophageal reflux disease)    Hair loss 09/22/2018   Head injury 12/24/2022   History of stomach ulcers    Hypothyroidism    IDA (iron  deficiency anemia)    Incisional hernia, without obstruction or gangrene    Intractable vomiting    Kidney stones    Melena 05/08/2021   Migraine    Neuropathy    Nodule of middle lobe of right lung 09/17/2021   NSAID long-term use 09/16/2016   Pre-diabetes    Protein-calorie malnutrition, severe (HCC)    PTSD (post-traumatic stress disorder)    Right-sided chest pain 09/17/2021   Sacroiliac joint pain 08/14/2018   Side pain 03/30/2018   Small bowel anastomotic dilation    Thrombocytosis    Upper GI bleed 05/18/2021   Vitamin B12 deficiency    Vitamin D  deficiency     Assessment: Patient Reported Symptoms:  Cognitive Cognitive Status: Alert and oriented to person, place, and time, Normal speech and language skills, Insightful and able to interpret abstract concepts Cognitive/Intellectual Conditions Management [RPT]: None reported or documented in medical history or problem list   Health Maintenance Behaviors: Annual physical exam, Healthy diet, Sleep adequate Healing Pattern: Average Health Facilitated by: Healthy diet, Rest  Neurological Neurological  Review of Symptoms: No symptoms reported Neurological Management Strategies: Routine screening Neurological Comment: no recent migraines  HEENT HEENT Symptoms Reported: No symptoms reported HEENT Management Strategies: Routine screening    Cardiovascular Cardiovascular Symptoms Reported: No symptoms reported Cardiovascular Management Strategies: Routine screening  Respiratory Respiratory Symptoms Reported: No symptoms reported Respiratory Management Strategies: Routine screening  Endocrine Endocrine Symptoms Reported: No symptoms reported Is patient diabetic?: No    Gastrointestinal Gastrointestinal Symptoms Reported: No symptoms reported Additional Gastrointestinal Details: G- tube site almost healed per surgeon 0 silver  nitrate applied to assist - patient reports looks good, no issues with J tube site or tube feedings, rare nuasea, no bloating Gastrointestinal Management Strategies: Incontinence garment/pad, Medication therapy, Nutrition support Enteral Nutrition Time Frame: Cyclic Enteral Nutrition Schedule: Nightlly TF, soft foods and shakes during the day, Parenteral Nutrition Method of Administration: Cyclic Parenteral Nutrition Schedule: overnight Gastrointestinal Comment: tolerating well, no n/v or bloating Nutrition Risk Screen (CP): Tube feeding or parenteral nutrition  Genitourinary Genitourinary Symptoms Reported: No symptoms reported Genitourinary Management Strategies: Medication therapy  Integumentary Integumentary Symptoms Reported: No symptoms reported Additional Integumentary Details: tube sites good Skin Management Strategies: Dressing changes  Musculoskeletal Musculoskelatal Symptoms Reviewed: No symptoms reported Musculoskeletal Management Strategies: Medication therapy Falls in the past year?: No Number of falls in past year: 1 or less Was there an injury with Fall?: No Fall Risk Category Calculator: 0 Patient Fall Risk Level: Low Fall Risk Patient at Risk for  Falls Due to: History of fall(s) Fall risk Follow up: Falls  evaluation completed, Falls prevention discussed  Psychosocial Psychosocial Symptoms Reported: No symptoms reported Additional Psychological Details: mood improved, hair no longer falling out since copper  level up, feeling stronger overall Behavioral Management Strategies: Medication therapy, Counseling, Adequate rest, Support system Behavioral Health Self-Management Outcome: 4 (good) Major Change/Loss/Stressor/Fears (CP): Medical condition, self Techniques to Cope with Loss/Stress/Change: Spiritual practice(s) Quality of Family Relationships: helpful, involved, supportive Do you feel physically threatened by others?: No    11/08/2023    PHQ2-9 Depression Screening   Little interest or pleasure in doing things Not at all  Feeling down, depressed, or hopeless Not at all  PHQ-2 - Total Score 0  Trouble falling or staying asleep, or sleeping too much    Feeling tired or having little energy    Poor appetite or overeating     Feeling bad about yourself - or that you are a failure or have let yourself or your family down    Trouble concentrating on things, such as reading the newspaper or watching television    Moving or speaking so slowly that other people could have noticed.  Or the opposite - being so fidgety or restless that you have been moving around a lot more than usual    Thoughts that you would be better off dead, or hurting yourself in some way    PHQ2-9 Total Score    If you checked off any problems, how difficult have these problems made it for you to do your work, take care of things at home, or get along with other people    Depression Interventions/Treatment      There were no vitals filed for this visit.  Medications Reviewed Today     Reviewed by Devra Lands, RN (Registered Nurse) on 11/08/23 at 1135  Med List Status: <None>   Medication Order Taking? Sig Documenting Provider Last Dose Status Informant   acetaminophen  (TYLENOL ) 500 MG tablet [id number] Yes Take 500-1,000 mg by mouth every 6 (six) hours as needed for moderate pain (pain score 4-6) or headache. [provider]  Active Other, Pharmacy Records, Self           Med Note CATHY OVAL DEL   Dju May 28, 2023  1:34 PM) prn  ARIPiprazole  (ABILIFY ) 2 MG tablet [id number] Yes Take 1 tablet (2 mg total) by mouth at bedtime. Hisada, Reina, MD  Active   levothyroxine  (SYNTHROID ) 75 MCG tablet [id number] Yes TAKE 1 TABLET BY MOUTH EVERY MORNING ON AN EMPTY STOMACH WITH WATER  ONLY. NO FOOD OR OTHER MEDICATIONS FOR 30 MINUTES. Gretta Comer POUR, NP  Active   mirtazapine  (REMERON ) 30 MG tablet [id number] Yes Take 1 tablet (30 mg total) by mouth at bedtime. Hisada, Reina, MD  Active   Multiple Vitamin (MULTIVITAMIN WITH MINERALS) TABS tablet [id number] Yes Take 1 tablet by mouth daily. Fausto Burnard LABOR, DO  Active Other, Pharmacy Records, Self  ondansetron  (ZOFRAN -ODT) 4 MG disintegrating tablet [id number] Yes Take 1 tablet (4 mg total) by mouth every 8 (eight) hours as needed for nausea. Clark, Katherine K, NP  Active   oxybutynin  (DITROPAN -XL) 5 MG 24 hr tablet [id number] Yes TAKE 1 TABLET (5 MG TOTAL) BY MOUTH AT BEDTIME. FOR BLADDER INCONTINENCE. Gretta Comer POUR, NP  Active Other, Pharmacy Records, Self  pantoprazole  (PROTONIX ) 40 MG tablet [id number] Yes Take 1 tablet (40 mg total) by mouth daily. [place], PA-C  Active Other, Pharmacy Records, Self           Med Note (STEFFENS, MICHELLE P  Thu Jul 07, 2023 10:21 AM)    SUMAtriptan  (IMITREX ) 50 MG tablet [id number] Yes Take 1 tablet at migraine onset. May repeat in 2 hours if headache persists or recurs. Gretta Comer POUR, NP  Active Other, Pharmacy Records, Self           Med Note CATHY OVAL DEL   Dju May 28, 2023  1:34 PM) prn  traMADol  (ULTRAM ) 50 MG tablet [id number] Yes Take 1 tablet (50 mg total) by mouth every 12 (twelve) hours as needed. Must last 30 days. Patel, Seema  K, NP  Active   Med List Note Geanie Wolm HERO, RN 08/29/23 1140): Patient discharged from [facility] 05/27/23 Pain contract signed 09/17/21 PA for Tramadol  done via [facility]  Key [id number]  kt MR 02-24-2022             Recommendation:   PCP Follow-up  Follow Up Plan:   Telephone follow-up 6 Shirleymae Hauth  Nestora Duos, MSN, RN [facility]  [facility], [facility] RN Care Manager Direct Dial: [phone] Fax: [phone]

## 2023-11-24 ENCOUNTER — Encounter: Admitting: Nurse Practitioner

## 2023-11-27 DIAGNOSIS — K315 Obstruction of duodenum: Secondary | ICD-10-CM | POA: Diagnosis not present

## 2023-12-04 DIAGNOSIS — E43 Unspecified severe protein-calorie malnutrition: Secondary | ICD-10-CM | POA: Diagnosis not present

## 2023-12-04 DIAGNOSIS — Z934 Other artificial openings of gastrointestinal tract status: Secondary | ICD-10-CM | POA: Diagnosis not present

## 2023-12-04 DIAGNOSIS — K315 Obstruction of duodenum: Secondary | ICD-10-CM | POA: Diagnosis not present

## 2023-12-09 ENCOUNTER — Ambulatory Visit: Admitting: Surgery

## 2023-12-12 ENCOUNTER — Encounter: Payer: Self-pay | Admitting: Surgery

## 2023-12-12 ENCOUNTER — Other Ambulatory Visit
Admission: RE | Admit: 2023-12-12 | Discharge: 2023-12-12 | Disposition: A | Discharge status: Home / Self Care | Source: Ambulatory Visit | Attending: Surgery | Admitting: Surgery

## 2023-12-12 ENCOUNTER — Ambulatory Visit: Admitting: Surgery

## 2023-12-12 VITALS — BP 136/81 | HR 105 | Temp 97.5°F | Wt 100.8 lb

## 2023-12-12 DIAGNOSIS — Z6820 Body mass index (BMI) 20.0-20.9, adult: Secondary | ICD-10-CM | POA: Insufficient documentation

## 2023-12-12 DIAGNOSIS — E46 Unspecified protein-calorie malnutrition: Secondary | ICD-10-CM | POA: Insufficient documentation

## 2023-12-12 DIAGNOSIS — K9413 Enterostomy malfunction: Secondary | ICD-10-CM

## 2023-12-12 DIAGNOSIS — Z434 Encounter for attention to other artificial openings of digestive tract: Secondary | ICD-10-CM | POA: Diagnosis not present

## 2023-12-12 DIAGNOSIS — Z980 Intestinal bypass and anastomosis status: Secondary | ICD-10-CM | POA: Diagnosis not present

## 2023-12-12 LAB — VITAMIN B12: Vitamin B-12: 741 pg/mL (ref 180–914)

## 2023-12-12 LAB — ALBUMIN: Albumin: 4.2 g/dL (ref 3.5–5.0)

## 2023-12-12 LAB — FOLATE: Folate: >20 ng/mL (ref >5.9)

## 2023-12-12 LAB — IRON: Iron: 34 ug/dL (ref 28–170)

## 2023-12-12 LAB — VITAMIN D 25 HYDROXY (VIT D DEFICIENCY, FRACTURES): Vit D, 25-Hydroxy: 46.93 ng/mL (ref 30–100)

## 2023-12-12 NOTE — Progress Notes (Signed)
 12/12/2023  History of Present Illness: Tanya Nguyen is a 58 y.o. female  s/p exploratory laparotomy, excision of prior loop gastrojejunostomy, and creating of new RNY type gastrojejunostoy with a feeding distal jejunostomy tube placement on 05/12/23.  She also required a venting G tube placed by IR on 05/30/23.    Patient is doing well and continues to gain weight, up to 100 lbs today.  Her goal weight is 110.  Continues with night time tube feeds, tolerating well, without worsening distention, nausea, or vomiting.  She is due for repeat labs today.  Past Medical History: Past Medical History:  Diagnosis Date   Acquired pyloric stricture    Bipolar disorder (HCC)    Bleeding duodenal ulcer    CAP (community acquired pneumonia) 02/12/2020   Chronic esophagogastric ulcer    Chronic low back pain    Chronic pain syndrome    Coffee ground emesis 03/29/2023   Constipation 04/02/2014   Depression    Duodenal obstruction    Gastric outlet obstruction 01/19/2018   GERD (gastroesophageal reflux disease)    Hair loss 09/22/2018   Head injury 12/24/2022   History of stomach ulcers    Hypothyroidism    IDA (iron  deficiency anemia)    Incisional hernia, without obstruction or gangrene    Intractable vomiting    Kidney stones    Melena 05/08/2021   Migraine    Neuropathy    Nodule of middle lobe of right lung 09/17/2021   NSAID long-term use 09/16/2016   Pre-diabetes    Protein-calorie malnutrition, severe    PTSD (post-traumatic stress disorder)    Right-sided chest pain 09/17/2021   Sacroiliac joint pain 08/14/2018   Side pain 03/30/2018   Small bowel anastomotic dilation    Thrombocytosis    Upper GI bleed 05/18/2021   Vitamin B12 deficiency    Vitamin D  deficiency      Past Surgical History: Past Surgical History:  Procedure Laterality Date   ABDOMINAL HYSTERECTOMY  03/08/2002   partial   bypass gastrojejunostomy     CHOLECYSTECTOMY     COLONOSCOPY WITH PROPOFOL  N/A  02/27/2018   Procedure: COLONOSCOPY WITH PROPOFOL ;  Surgeon: Jinny Carmine, MD;  Location: [facility];  Service: Endoscopy;  Laterality: N/A;   ESOPHAGOGASTRODUODENOSCOPY (EGD) WITH PROPOFOL  N/A 01/12/2018   Procedure: ESOPHAGOGASTRODUODENOSCOPY (EGD) WITH BIOPSIES;  Surgeon: Jinny Carmine, MD;  Location: [facility];  Service: Endoscopy;  Laterality: N/A;   ESOPHAGOGASTRODUODENOSCOPY (EGD) WITH PROPOFOL  N/A 01/25/2018   Procedure: ESOPHAGOGASTRODUODENOSCOPY (EGD) WITH PROPOFOL ;  Surgeon: Therisa Bi, MD;  Location: [facility];  Service: Gastroenterology;  Laterality: N/A;   ESOPHAGOGASTRODUODENOSCOPY (EGD) WITH PROPOFOL  N/A 02/27/2018   Procedure: ESOPHAGOGASTRODUODENOSCOPY (EGD) WITH PROPOFOL ;  Surgeon: Jinny Carmine, MD;  Location: [facility];  Service: Endoscopy;  Laterality: N/A;   ESOPHAGOGASTRODUODENOSCOPY (EGD) WITH PROPOFOL  N/A 05/09/2021   Procedure: ESOPHAGOGASTRODUODENOSCOPY (EGD) WITH PROPOFOL ;  Surgeon: Onita Elspeth Sharper, DO;  Location: [facility];  Service: Gastroenterology;  Laterality: N/A;   ESOPHAGOGASTRODUODENOSCOPY (EGD) WITH PROPOFOL  N/A 03/09/2022   Procedure: ESOPHAGOGASTRODUODENOSCOPY (EGD) WITH PROPOFOL ;  Surgeon: Jinny Carmine, MD;  Location: [facility] ENDOSCOPY;  Service: Endoscopy;  Laterality: N/A;   ESOPHAGOGASTRODUODENOSCOPY (EGD) WITH PROPOFOL  N/A 03/30/2023   Procedure: ESOPHAGOGASTRODUODENOSCOPY (EGD) WITH PROPOFOL ;  Surgeon: Jinny Carmine, MD;  Location: [facility] ENDOSCOPY;  Service: Endoscopy;  Laterality: N/A;   GASTROJEJUNOSTOMY N/A 05/12/2023   Procedure: GASTROJEJUNOSTOMY, revision, Arthea Platt, PA-C to assist, possible feeding jejunostomy;  Surgeon: Desiderio Schanz, MD;  Location: [facility] ORS;  Service: General;  Laterality:  N/A;   INCISIONAL HERNIA REPAIR N/A 11/23/2018   Procedure: HERNIA REPAIR INCISIONAL;  Surgeon: Desiderio Schanz, MD;  Location: [facility] ORS;  Service: General;  Laterality: N/A;   INSERTION OF MESH N/A 11/23/2018    Procedure: INSERTION OF MESH;  Surgeon: Desiderio Schanz, MD;  Location: [facility] ORS;  Service: General;  Laterality: N/A;   IR GASTROSTOMY TUBE MOD SED  05/30/2023   IR REPLC DUODEN/JEJUNO TUBE PERCUT W/FLUORO  06/07/2023   IR REPLC DUODEN/JEJUNO TUBE PERCUT W/FLUORO  06/21/2023   IR REPLC GASTRO/COLONIC TUBE PERCUT W/FLUORO  06/17/2023   LAPAROTOMY N/A 01/20/2018   Procedure: EXPLORATORY LAPAROTOMY;  Surgeon: Desiderio Schanz, MD;  Location: [facility] ORS;  Service: General;  Laterality: N/A;   LYSIS OF ADHESION N/A 05/12/2023   Procedure: LYSIS OF ADHESION, open, Arthea Platt, PA-C to assist;  Surgeon: Desiderio Schanz, MD;  Location: [facility] ORS;  Service: General;  Laterality: N/A;   TOOTH EXTRACTION      Home Medications: Prior to Admission medications   Medication Sig Start Date End Date Taking? Authorizing Provider  acetaminophen  (TYLENOL ) 500 MG tablet Take 500-1,000 mg by mouth every 6 (six) hours as needed for moderate pain (pain score 4-6) or headache.   Yes [provider]  ARIPiprazole  (ABILIFY ) 2 MG tablet Take 1 tablet (2 mg total) by mouth at bedtime. 09/19/23 12/18/23 Yes Vickey Mettle, MD  levothyroxine  (SYNTHROID ) 75 MCG tablet Take 1 tablet by mouth every morning on an empty stomach with water  only.  No food or other medications for 30 minutes. 07/18/23  Yes Gretta Comer POUR, NP  mirtazapine  (REMERON ) 15 MG tablet Take 1 tablet (15 mg total) by mouth at bedtime. 08/31/23 11/29/23 Yes Hisada, Reina, MD  Multiple Vitamin (MULTIVITAMIN WITH MINERALS) TABS tablet Take 1 tablet by mouth daily. 04/01/23  Yes Fausto Sor A, DO  ondansetron  (ZOFRAN -ODT) 4 MG disintegrating tablet Take 1 tablet (4 mg total) by mouth every 8 (eight) hours as needed for nausea. 07/01/23  Yes Gretta Comer POUR, NP  oxybutynin  (DITROPAN -XL) 5 MG 24 hr tablet TAKE 1 TABLET (5 MG TOTAL) BY MOUTH AT BEDTIME. FOR BLADDER INCONTINENCE. 01/21/23  Yes Clark, Katherine K, NP  pantoprazole  (PROTONIX ) 40 MG tablet Take 1 tablet  (40 mg total) by mouth daily. 05/04/23 04/28/24 Yes [place], PA-C  ramelteon  (ROZEREM ) 8 MG tablet Take 1 tablet (8 mg total) by mouth at bedtime as needed for sleep. 09/19/23 11/18/23 Yes Hisada, Mettle, MD  SUMAtriptan  (IMITREX ) 50 MG tablet Take 1 tablet at migraine onset. May repeat in 2 hours if headache persists or recurs. 03/08/23  Yes Clark, Katherine K, NP  traMADol  (ULTRAM ) 50 MG tablet Take 1 tablet (50 mg total) by mouth every 12 (twelve) hours as needed. Must last 30 days. 08/29/23  Yes Patel, Seema K, NP    Allergies: Allergies  Allergen Reactions   Penicillins Shortness Of Breath, Diarrhea and Nausea And Vomiting    TOLERATED CEPHALOSPORINS   Nsaids Other (See Comments)    Pt states NSAIDS cause internal bleeding   Gabapentin  Other (See Comments)    Hair Loss    Pregabalin      Tremors   Sulfa Antibiotics Nausea And Vomiting    Review of Systems: Review of Systems  Constitutional:  Negative for chills and fever.  Respiratory:  Negative for shortness of breath.   Cardiovascular:  Negative for chest pain.  Gastrointestinal:  Negative for abdominal pain, nausea and vomiting.    Physical Exam BP 136/81   Pulse ROLLEN)  105   Temp (!) 97.5 F (36.4 C)   Wt 100 lb 12.8 oz (45.7 kg)   SpO2 98%   BMI 20.36 kg/m  CONSTITUTIONAL: No acute distress HEENT:  Normocephalic, atraumatic, extraocular motion intact. RESPIRATORY:  Normal respiratory effort without pathologic use of accessory muscles. CARDIOVASCULAR: Regular rhythm and rate GI: The abdomen is soft, nondistended, non-tender to palpation.  G tube site is fully healed.  J tube site without any evidence of infection and no complications with the balloon this time. NEUROLOGIC:  Motor and sensation is grossly normal.  Cranial nerves are grossly intact. PSYCH:  Alert and oriented to person, place and time. Affect is normal.  Assessment and Plan: This is a 58 y.o. female  s/p exlap, excision of prior loop  gastrojejunostomy and creation of new RNY type gastrojejunostomy with feeding jejunostomy   --Patient continues to do well and her weight continues to improve.  Discussed with her that once she reaches the goal of 110 pounds, then we will be able to stop her tube feeds temporarily.  For now, continue on her tube feeds at night and regular diet during the day.   -- Will order repeat lab check including copper , zinc , vitamin A , vitamin D , vitamin EE, vitamin B1, vitamin B12, folate, iron , and albumin. - Follow-up with me in 2 months.  I spent 20 minutes dedicated to the care of this patient on the date of this encounter to include pre-visit review of records, face-to-face time with the patient discussing diagnosis and management, and any post-visit coordination of care.   Aloysius Sheree Plant, MD [facility]

## 2023-12-12 NOTE — Patient Instructions (Signed)
 Have your labs done at [facility], enter in through the [facility] entrance and let them know you are there for lab work.   Follow-up with our office in 2 months.   Please call and ask to speak with a nurse if you develop questions or concerns.

## 2023-12-14 LAB — VITAMIN E
Vitamin E (Alpha Tocopherol): 15.4 mg/L (ref 7.0–25.1)
Vitamin E(Gamma Tocopherol): 0.2 mg/L — ABNORMAL LOW (ref 0.5–5.5)

## 2023-12-14 LAB — VITAMIN A: Vitamin A (Retinoic Acid): 100.1 ug/dL — ABNORMAL HIGH (ref 20.1–62.0)

## 2023-12-14 LAB — VITAMIN B1: Vitamin B1 (Thiamine): 184.3 nmol/L (ref 66.5–200.0)

## 2023-12-15 LAB — ZINC: Zinc: 111 ug/dL (ref 44–115)

## 2023-12-15 LAB — COPPER, SERUM: Copper: 121 ug/dL (ref 80–158)

## 2023-12-20 ENCOUNTER — Other Ambulatory Visit: Payer: Self-pay

## 2023-12-20 NOTE — Patient Outreach (Signed)
 Complex Care Management   Visit Note  12/20/2023  Name:  Tanya Nguyen MRN: [id number] DOB: 1965/04/09  Situation: Referral received for Complex Care Management related to Nutrition I obtained verbal consent from Patient.  Visit completed with Patient  on the phone  Background:   Past Medical History:  Diagnosis Date   Acquired pyloric stricture    Bipolar disorder (HCC)    Bleeding duodenal ulcer    CAP (community acquired pneumonia) 02/12/2020   Chronic esophagogastric ulcer    Chronic low back pain    Chronic pain syndrome    Coffee ground emesis 03/29/2023   Constipation 04/02/2014   Depression    Duodenal obstruction    Gastric outlet obstruction 01/19/2018   GERD (gastroesophageal reflux disease)    Hair loss 09/22/2018   Head injury 12/24/2022   History of stomach ulcers    Hypothyroidism    IDA (iron  deficiency anemia)    Incisional hernia, without obstruction or gangrene    Intractable vomiting    Kidney stones    Melena 05/08/2021   Migraine    Neuropathy    Nodule of middle lobe of right lung 09/17/2021   NSAID long-term use 09/16/2016   Pre-diabetes    Protein-calorie malnutrition, severe    PTSD (post-traumatic stress disorder)    Right-sided chest pain 09/17/2021   Sacroiliac joint pain 08/14/2018   Side pain 03/30/2018   Small bowel anastomotic dilation    Thrombocytosis    Upper GI bleed 05/18/2021   Vitamin B12 deficiency    Vitamin D  deficiency     Assessment: Patient Reported Symptoms:  Cognitive Cognitive Status: No symptoms reported Cognitive/Intellectual Conditions Management [RPT]: None reported or documented in medical history or problem list      Neurological Neurological Review of Symptoms: No symptoms reported Neurological Comment: no recent migarines  HEENT HEENT Symptoms Reported: No symptoms reported HEENT Management Strategies: Routine screening    Cardiovascular Cardiovascular Symptoms Reported: No symptoms  reported Cardiovascular Management Strategies: Routine screening  Respiratory Respiratory Symptoms Reported: No symptoms reported Respiratory Management Strategies: Routine screening  Endocrine Endocrine Symptoms Reported: No symptoms reported    Gastrointestinal Additional Gastrointestinal Details: no issues with G-tube or site - states likely remove in 2 mos Enteral Nutrition Schedule: continues TF at night regular in day Nutrition Risk Screen (CP): Tube feeding or parenteral nutrition  Genitourinary Genitourinary Symptoms Reported: No symptoms reported Genitourinary Management Strategies: Medication therapy  Integumentary Integumentary Symptoms Reported: No symptoms reported    Musculoskeletal Musculoskelatal Symptoms Reviewed: No symptoms reported        Psychosocial       Quality of Family Relationships: helpful, involved, supportive Do you feel physically threatened by others?: No    12/20/2023    PHQ2-9 Depression Screening   Little interest or pleasure in doing things    Feeling down, depressed, or hopeless    PHQ-2 - Total Score    Trouble falling or staying asleep, or sleeping too much    Feeling tired or having little energy    Poor appetite or overeating     Feeling bad about yourself - or that you are a failure or have let yourself or your family down    Trouble concentrating on things, such as reading the newspaper or watching television    Moving or speaking so slowly that other people could have noticed.  Or the opposite - being so fidgety or restless that you have been moving around a lot more than usual  Thoughts that you would be better off dead, or hurting yourself in some way    PHQ2-9 Total Score    If you checked off any problems, how difficult have these problems made it for you to do your work, take care of things at home, or get along with other people    Depression Interventions/Treatment      There were no vitals filed for this  visit.  Medications Reviewed Today     Reviewed by Devra Lands, RN (Registered Nurse) on 12/20/23 at 1136  Med List Status: <None>   Medication Order Taking? Sig Documenting Provider Last Dose Status Informant  acetaminophen  (TYLENOL ) 500 MG tablet [id number] Yes Take 500-1,000 mg by mouth every 6 (six) hours as needed for moderate pain (pain score 4-6) or headache. [provider]  Active Other, Pharmacy Records, Self           Med Note CATHY OVAL DEL   Dju May 28, 2023  1:34 PM) prn  ARIPiprazole  (ABILIFY ) 2 MG tablet [id number] Yes Take 1 tablet (2 mg total) by mouth at bedtime. Vickey Mettle, MD  Active   levothyroxine  (SYNTHROID ) 75 MCG tablet [id number] Yes TAKE 1 TABLET BY MOUTH EVERY MORNING ON AN EMPTY STOMACH WITH WATER  ONLY. NO FOOD OR OTHER MEDICATIONS FOR 30 MINUTES. Gretta Comer POUR, NP  Active   mirtazapine  (REMERON ) 30 MG tablet [id number] Yes Take 1 tablet (30 mg total) by mouth at bedtime. Hisada, Reina, MD  Active   Multiple Vitamin (MULTIVITAMIN WITH MINERALS) TABS tablet [id number] Yes Take 1 tablet by mouth daily. Fausto Burnard LABOR, DO  Active Other, Pharmacy Records, Self  ondansetron  (ZOFRAN -ODT) 4 MG disintegrating tablet [id number]  Take 1 tablet (4 mg total) by mouth every 8 (eight) hours as needed for nausea.  Patient not taking: Reported on 12/20/2023   Clark, Katherine K, NP  Active   oxybutynin  (DITROPAN -XL) 5 MG 24 hr tablet [id number] Yes TAKE 1 TABLET (5 MG TOTAL) BY MOUTH AT BEDTIME. FOR BLADDER INCONTINENCE. Gretta Comer POUR, NP  Active Other, Pharmacy Records, Self  pantoprazole  (PROTONIX ) 40 MG tablet [id number] Yes Take 1 tablet (40 mg total) by mouth daily. [place], PA-C  Active Other, Pharmacy Records, Self           Med Note (STEFFENS, MICHELLE P   Thu Jul 07, 2023 10:21 AM)    SUMAtriptan  (IMITREX ) 50 MG tablet [id number] Yes Take 1 tablet at migraine onset. May repeat in 2 hours if headache persists or recurs. Gretta Comer POUR, NP   Active Other, Pharmacy Records, Self           Med Note CATHY OVAL DEL   Dju May 28, 2023  1:34 PM) prn  traMADol  (ULTRAM ) 50 MG tablet [id number] Yes Take 1 tablet (50 mg total) by mouth every 12 (twelve) hours as needed. Must last 30 days. Patel, Seema K, NP  Active   Med List Note Geanie Wolm HERO, RN 08/29/23 1140): Patient discharged from [facility] 05/27/23 Pain contract signed 09/17/21 PA for Tramadol  done via [facility]  Key [id number]  kt MR 02-24-2022             Recommendation:   PCP Follow-up Continue Current Plan of Care  Follow Up Plan:   Patient has met all care management goals. Care Management case will be closed. Patient has been provided contact information should new needs arise.   Lands Devra, MSN, RN [place]  [facility], [facility] RN Care Manager Direct  Dial: [phone] Fax: [phone]

## 2023-12-20 NOTE — Patient Instructions (Signed)
 Visit Information  Thank you for taking time to visit with me today. Please don't hesitate to contact me if I can be of assistance to you before our next scheduled appointment.  Your next care management appointment is no further scheduled appointments.  Congratulations on your progress!   Patient has met all care management goals. Care Management case will be closed. Patient has been provided contact information should new needs arise.   Please call the care guide team at [phone] if you need to cancel, schedule, or reschedule an appointment.   Please call the Suicide and Crisis Lifeline: 988 call the [place]  National Suicide Prevention Lifeline: 859-342-3053 or TTY: (616)058-3367 TTY 604 689 7795) to talk to a trained counselor call 1-800-273-TALK (toll free, 24 hour hotline) go to [facility] [address] [place] [phone]) call 911 if you are experiencing a Mental Health or Behavioral Health Crisis or need someone to talk to.  Nestora Duos, MSN, RN [facility], [facility] RN Care Manager Direct Dial: [phone] Fax: [phone]
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
## 2023-12-27 DIAGNOSIS — K315 Obstruction of duodenum: Secondary | ICD-10-CM | POA: Diagnosis not present

## 2023-12-29 ENCOUNTER — Encounter: Payer: Self-pay | Admitting: Psychiatry

## 2023-12-29 ENCOUNTER — Ambulatory Visit (INDEPENDENT_AMBULATORY_CARE_PROVIDER_SITE_OTHER): Admitting: Psychiatry

## 2023-12-29 ENCOUNTER — Other Ambulatory Visit: Payer: Self-pay

## 2023-12-29 VITALS — BP 131/71 | HR 94 | Temp 97.8°F | Ht 59.0 in | Wt 102.8 lb

## 2023-12-29 DIAGNOSIS — F3342 Major depressive disorder, recurrent, in full remission: Secondary | ICD-10-CM | POA: Diagnosis not present

## 2023-12-29 DIAGNOSIS — G4700 Insomnia, unspecified: Secondary | ICD-10-CM

## 2023-12-29 DIAGNOSIS — F4310 Post-traumatic stress disorder, unspecified: Secondary | ICD-10-CM | POA: Diagnosis not present

## 2023-12-29 NOTE — Patient Instructions (Signed)
 Continue mirtazapine  30 mg at night  Continue Abilify  2 mg at night   Next appointment: 1/15 at 10 AM

## 2024-01-02 ENCOUNTER — Other Ambulatory Visit: Payer: Self-pay | Admitting: Psychiatry

## 2024-01-02 ENCOUNTER — Telehealth: Payer: Self-pay

## 2024-01-02 MED ORDER — MIRTAZAPINE 30 MG PO TABS: 30.0000 mg | ORAL_TABLET | Freq: Every day | ORAL | 0 refills | Status: DC

## 2024-01-02 NOTE — Telephone Encounter (Signed)
 Medication management - Call back with patient, after she had left a message requesting a refill of her Mirtazapine  30 mg and also called [facility]'s [facility] to verify she last filled the 90 day order on 10/20/23. Patient admitted sometimes she takes 2 at night when she still has problems with sleep.  Patient reported she takes one most of the time but does take 2 periodically and that is why she is running out early, only has a few days remaining.  Agreed to send the refill request to Dr. Hisada with the information at times she is taking 2 at night.  Patient scheduled to return 03/22/24.

## 2024-01-02 NOTE — Telephone Encounter (Signed)
 Ordered. Please remind her never to take two tablets of mirtazapine . This medication is primarily intended to support her mood, and higher doses may lead to significant adverse reactions.

## 2024-01-02 NOTE — Telephone Encounter (Signed)
 Medication management - Call with pt to inform Dr. Hisada did send in a new Mirtazapine  order for pt [facility] and discussed with her the need to only take one at night and no more due to concerns this may lead to possible adverse reactions. Patient stated understanding this and agreed to only take one per night and if still has issues with sleep, she will call to get and earlier appointment to discuss with Dr. Vickey then further.

## 2024-01-04 ENCOUNTER — Encounter: Admitting: Primary Care

## 2024-01-06 DIAGNOSIS — Z931 Gastrostomy status: Secondary | ICD-10-CM | POA: Diagnosis not present

## 2024-01-06 DIAGNOSIS — E43 Unspecified severe protein-calorie malnutrition: Secondary | ICD-10-CM | POA: Diagnosis not present

## 2024-01-06 DIAGNOSIS — Z934 Other artificial openings of gastrointestinal tract status: Secondary | ICD-10-CM | POA: Diagnosis not present

## 2024-01-06 DIAGNOSIS — K315 Obstruction of duodenum: Secondary | ICD-10-CM | POA: Diagnosis not present

## 2024-01-11 ENCOUNTER — Other Ambulatory Visit: Payer: Self-pay | Admitting: Primary Care

## 2024-01-11 ENCOUNTER — Encounter: Payer: Self-pay | Admitting: Primary Care

## 2024-01-11 ENCOUNTER — Ambulatory Visit (INDEPENDENT_AMBULATORY_CARE_PROVIDER_SITE_OTHER): Admitting: Primary Care

## 2024-01-11 VITALS — BP 126/80 | HR 103 | Temp 98.4°F | Ht 60.0 in | Wt 103.4 lb

## 2024-01-11 DIAGNOSIS — N3941 Urge incontinence: Secondary | ICD-10-CM | POA: Diagnosis not present

## 2024-01-11 DIAGNOSIS — K219 Gastro-esophageal reflux disease without esophagitis: Secondary | ICD-10-CM

## 2024-01-11 DIAGNOSIS — R7303 Prediabetes: Secondary | ICD-10-CM

## 2024-01-11 DIAGNOSIS — F419 Anxiety disorder, unspecified: Secondary | ICD-10-CM | POA: Diagnosis not present

## 2024-01-11 DIAGNOSIS — G609 Hereditary and idiopathic neuropathy, unspecified: Secondary | ICD-10-CM | POA: Diagnosis not present

## 2024-01-11 DIAGNOSIS — F330 Major depressive disorder, recurrent, mild: Secondary | ICD-10-CM | POA: Diagnosis not present

## 2024-01-11 DIAGNOSIS — G4700 Insomnia, unspecified: Secondary | ICD-10-CM | POA: Diagnosis not present

## 2024-01-11 DIAGNOSIS — Z23 Encounter for immunization: Secondary | ICD-10-CM

## 2024-01-11 DIAGNOSIS — Z1231 Encounter for screening mammogram for malignant neoplasm of breast: Secondary | ICD-10-CM

## 2024-01-11 DIAGNOSIS — Z0000 Encounter for general adult medical examination without abnormal findings: Secondary | ICD-10-CM

## 2024-01-11 DIAGNOSIS — R634 Abnormal weight loss: Secondary | ICD-10-CM

## 2024-01-11 DIAGNOSIS — E038 Other specified hypothyroidism: Secondary | ICD-10-CM | POA: Diagnosis not present

## 2024-01-11 DIAGNOSIS — G43111 Migraine with aura, intractable, with status migrainosus: Secondary | ICD-10-CM

## 2024-01-11 DIAGNOSIS — F4310 Post-traumatic stress disorder, unspecified: Secondary | ICD-10-CM

## 2024-01-11 LAB — LIPID PANEL
Cholesterol: 218 mg/dL — ABNORMAL HIGH (ref 0–200)
HDL: 60.7 mg/dL (ref >39.00)
LDL Cholesterol: 123 mg/dL — ABNORMAL HIGH (ref 0–99)
NonHDL: 157.1
Total CHOL/HDL Ratio: 4
Triglycerides: 170 mg/dL — ABNORMAL HIGH (ref 0.0–149.0)
VLDL: 34 mg/dL (ref 0.0–40.0)

## 2024-01-11 LAB — HEMOGLOBIN A1C: Hgb A1c MFr Bld: 5.7 % (ref 4.6–6.5)

## 2024-01-11 MED ORDER — SUMATRIPTAN SUCCINATE 50 MG PO TABS: ORAL_TABLET | ORAL | 0 refills | Status: AC

## 2024-01-11 NOTE — Assessment & Plan Note (Signed)
 No concerns today. Continue to monitor.

## 2024-01-11 NOTE — Assessment & Plan Note (Signed)
 Controlled.  Following with psychiatry, office notes reviewed from October 2025.  Continue mirtazapine  30 mg daily.

## 2024-01-11 NOTE — Progress Notes (Signed)
 Subjective:    Patient ID: Tanya Nguyen, female    DOB: [birth year], 58 y.o.   MRN: [id number]  Tanya Nguyen is a very pleasant 58 y.o. female who presents today for complete physical and follow up of chronic conditions.  Immunizations: -Tetanus: Completed in 2016 -Influenza: Influenza vaccine provided today.  -Shingles: Completed Shingrix series  Diet: Fair diet.  Exercise: No regular exercise.  Eye exam: Completed >1 year ago Dental exam: Completes semi-annually    Pap Smear: hysterectomy  Mammogram: Completed in May 2024  Colonoscopy: Completed in 2019, due 2029   Wt Readings from Last 3 Encounters:  01/11/24 103 lb 6 oz (46.9 kg)  12/29/23 102 lb 12.8 oz (46.6 kg)  12/12/23 100 lb 12.8 oz (45.7 kg)      Review of Systems  Constitutional:  Negative for unexpected weight change.  HENT:  Negative for rhinorrhea.   Respiratory:  Negative for cough and shortness of breath.   Cardiovascular:  Negative for chest pain.  Gastrointestinal:  Negative for constipation and diarrhea.  Genitourinary:  Negative for difficulty urinating.  Musculoskeletal:  Negative for arthralgias and myalgias.  Skin:  Negative for rash.  Allergic/Immunologic: Negative for environmental allergies.  Neurological:  Negative for dizziness and headaches.  Psychiatric/Behavioral:  The patient is not nervous/anxious.          Past Medical History:  Diagnosis Date   Acquired pyloric stricture    Bipolar disorder (HCC)    Bleeding duodenal ulcer    CAP (community acquired pneumonia) 02/12/2020   Chronic esophagogastric ulcer    Chronic low back pain    Chronic pain syndrome    Coffee ground emesis 03/29/2023   Constipation 04/02/2014   Depression    Duodenal obstruction    Gastric outlet obstruction 01/19/2018   GERD (gastroesophageal reflux disease)    Hair loss 09/22/2018   Head injury 12/24/2022   History of stomach ulcers    Hypothyroidism    IDA (iron  deficiency anemia)     Incisional hernia, without obstruction or gangrene    Intractable vomiting    Kidney stones    Melena 05/08/2021   Migraine    Neuropathy    Nodule of middle lobe of right lung 09/17/2021   NSAID long-term use 09/16/2016   Pre-diabetes    Protein-calorie malnutrition, severe    PTSD (post-traumatic stress disorder)    Right-sided chest pain 09/17/2021   Sacroiliac joint pain 08/14/2018   Side pain 03/30/2018   Small bowel anastomotic dilation    Thrombocytosis    Upper GI bleed 05/18/2021   Vitamin B12 deficiency    Vitamin D  deficiency     Social History   Socioeconomic History   Marital status: Married    Spouse name: Not on file   Number of children: 0   Years of education: 12   Highest education level: High school graduate  Occupational History   Occupation: Homemaker  Tobacco Use   Smoking status: Never    Passive exposure: Never   Smokeless tobacco: Never  Vaping Use   Vaping status: Never Used  Substance and Sexual Activity   Alcohol use: Not Currently    Alcohol/week: 0.0 standard drinks of alcohol   Drug use: No   Sexual activity: Not Currently    Birth control/protection: None  Other Topics Concern   Not on file  Social History Narrative   Lives at home with her husband.   Right-handed.   Rare caffeine.   Social Drivers of Health  Financial Resource Strain: Not on file  Food Insecurity: No Food Insecurity (12/20/2023)   Hunger Vital Sign    Worried About Running Out of Food in the Last Year: Never true    Ran Out of Food in the Last Year: Never true  Transportation Needs: No Transportation Needs (12/20/2023)   PRAPARE - Administrator, Civil Service (Medical): No    Lack of Transportation (Non-Medical): No  Physical Activity: Not on file  Stress: Not on file  Social Connections: Unknown (06/21/2023)   Social Connection and Isolation Panel    Frequency of Communication with Friends and Family: More than three times a week     Frequency of Social Gatherings with Friends and Family: Twice a week    Attends Religious Services: Patient declined    Database Administrator or Organizations: Yes    Attends Banker Meetings: Patient declined    Marital Status: Patient declined  Intimate Partner Violence: Not At Risk (12/20/2023)   Humiliation, Afraid, Rape, and Kick questionnaire    Fear of Current or Ex-Partner: No    Emotionally Abused: No    Physically Abused: No    Sexually Abused: No    Past Surgical History:  Procedure Laterality Date   ABDOMINAL HYSTERECTOMY  03/08/2002   partial   bypass gastrojejunostomy     CHOLECYSTECTOMY     COLONOSCOPY WITH PROPOFOL  N/A 02/27/2018   Procedure: COLONOSCOPY WITH PROPOFOL ;  Surgeon: Jinny Carmine, MD;  Location: [facility];  Service: Endoscopy;  Laterality: N/A;   ESOPHAGOGASTRODUODENOSCOPY (EGD) WITH PROPOFOL  N/A 01/12/2018   Procedure: ESOPHAGOGASTRODUODENOSCOPY (EGD) WITH BIOPSIES;  Surgeon: Jinny Carmine, MD;  Location: [facility];  Service: Endoscopy;  Laterality: N/A;   ESOPHAGOGASTRODUODENOSCOPY (EGD) WITH PROPOFOL  N/A 01/25/2018   Procedure: ESOPHAGOGASTRODUODENOSCOPY (EGD) WITH PROPOFOL ;  Surgeon: Therisa Bi, MD;  Location: [facility] END[facility]COPY;  Service: Gastroenterology;  Laterality: N/A;   ESOPHAGOGASTRODUODENOSCOPY (EGD) WITH PROPOFOL  N/A 02/27/2018   Procedure: ESOPHAGOGASTRODUODENOSCOPY (EGD) WITH PROPOFOL ;  Surgeon: Jinny Carmine, MD;  Location: [facility];  Service: Endoscopy;  Laterality: N/A;   ESOPHAGOGASTRODUODENOSCOPY (EGD) WITH PROPOFOL  N/A 05/09/2021   Procedure: ESOPHAGOGASTRODUODENOSCOPY (EGD) WITH PROPOFOL ;  Surgeon: Onita Elspeth Sharper, DO;  Location: [facility] END[facility]COPY;  Service: Gastroenterology;  Laterality: N/A;   ESOPHAGOGASTRODUODENOSCOPY (EGD) WITH PROPOFOL  N/A 03/09/2022   Procedure: ESOPHAGOGASTRODUODENOSCOPY (EGD) WITH PROPOFOL ;  Surgeon: Jinny Carmine, MD;  Location: [facility] ENDOSCOPY;  Service: Endoscopy;   Laterality: N/A;   ESOPHAGOGASTRODUODENOSCOPY (EGD) WITH PROPOFOL  N/A 03/30/2023   Procedure: ESOPHAGOGASTRODUODENOSCOPY (EGD) WITH PROPOFOL ;  Surgeon: Jinny Carmine, MD;  Location: [facility] ENDOSCOPY;  Service: Endoscopy;  Laterality: N/A;   GASTROJEJUNOSTOMY N/A 05/12/2023   Procedure: GASTROJEJUNOSTOMY, revision, Arthea Platt, PA-C to assist, possible feeding jejunostomy;  Surgeon: Desiderio Schanz, MD;  Location: [facility] ORS;  Service: General;  Laterality: N/A;   INCISIONAL HERNIA REPAIR N/A 11/23/2018   Procedure: HERNIA REPAIR INCISIONAL;  Surgeon: Desiderio Schanz, MD;  Location: [facility] ORS;  Service: General;  Laterality: N/A;   INSERTION OF MESH N/A 11/23/2018   Procedure: INSERTION OF MESH;  Surgeon: Desiderio Schanz, MD;  Location: [facility] ORS;  Service: General;  Laterality: N/A;   IR GASTROSTOMY TUBE MOD SED  05/30/2023   IR REPLC DUODEN/JEJUNO TUBE PERCUT W/FLUORO  06/07/2023   IR REPLC DUODEN/JEJUNO TUBE PERCUT W/FLUORO  06/21/2023   IR REPLC GASTRO/COLONIC TUBE PERCUT W/FLUORO  06/17/2023   LAPAROTOMY N/A 01/20/2018   Procedure: EXPLORATORY LAPAROTOMY;  Surgeon: Desiderio Schanz, MD;  Location: [facility] ORS;  Service: General;  Laterality: N/A;   LYSIS OF ADHESION N/A 05/12/2023   Procedure: LYSIS OF ADHESION, open, Arthea Platt, PA-C to assist;  Surgeon: Desiderio Schanz, MD;  Location: [facility] ORS;  Service: General;  Laterality: N/A;   TOOTH EXTRACTION      Family History  Problem Relation Age of Onset   Cancer Mother    Breast cancer Mother 9   Lung cancer Father    Drug abuse Sister    Alcohol abuse Sister    Bipolar disorder Sister    Alcohol abuse Maternal Grandmother    Stroke Maternal Grandmother    Stroke Paternal Grandmother     Allergies  Allergen Reactions   Penicillins Shortness Of Breath, Diarrhea and Nausea And Vomiting    TOLERATED CEPHALOSPORINS   Nsaids Other (See Comments)    Pt states NSAIDS cause internal bleeding   Gabapentin  Other (See Comments)    Hair Loss     Pregabalin      Tremors   Sulfa Antibiotics Nausea And Vomiting    Current Outpatient Medications on File Prior to Visit  Medication Sig Dispense Refill   acetaminophen  (TYLENOL ) 500 MG tablet Take 500-1,000 mg by mouth every 6 (six) hours as needed for moderate pain (pain score 4-6) or headache.     ARIPiprazole  (ABILIFY ) 2 MG tablet Take 1 tablet (2 mg total) by mouth at bedtime. 90 tablet 0   levothyroxine  (SYNTHROID ) 75 MCG tablet TAKE 1 TABLET BY MOUTH EVERY MORNING ON AN EMPTY STOMACH WITH WATER  ONLY. NO FOOD OR OTHER MEDICATIONS FOR 30 MINUTES. 90 tablet 0   mirtazapine  (REMERON ) 30 MG tablet Take 1 tablet (30 mg total) by mouth at bedtime. 90 tablet 0   Multiple Vitamin (MULTIVITAMIN WITH MINERALS) TABS tablet Take 1 tablet by mouth daily.     pantoprazole  (PROTONIX ) 40 MG tablet Take 1 tablet (40 mg total) by mouth daily. 90 tablet 3   traMADol  (ULTRAM ) 50 MG tablet Take 1 tablet (50 mg total) by mouth every 12 (twelve) hours as needed. Must last 30 days. 60 tablet 2   No current facility-administered medications on file prior to visit.    BP 126/80   Pulse (!) 103   Temp 98.4 F (36.9 C) (Oral)   Ht 5' (1.524 m)   Wt 103 lb 6 oz (46.9 kg)   SpO2 97%   BMI 20.19 kg/m  Objective:   Physical Exam HENT:     Right Ear: Tympanic membrane and ear canal normal.     Left Ear: Tympanic membrane and ear canal normal.  Eyes:     Pupils: Pupils are equal, round, and reactive to light.  Cardiovascular:     Rate and Rhythm: Normal rate and regular rhythm.  Pulmonary:     Effort: Pulmonary effort is normal.     Breath sounds: Normal breath sounds.  Abdominal:     General: Bowel sounds are normal.     Palpations: Abdomen is soft.     Tenderness: There is no abdominal tenderness.     Comments: Feeding tube in place, intact  Musculoskeletal:        General: Normal range of motion.     Cervical back: Neck supple.  Skin:    General: Skin is warm and dry.  Neurological:      Mental Status: She is alert and oriented to person, place, and time.     Cranial Nerves: No cranial nerve deficit.     Deep Tendon Reflexes:     Reflex Scores:  Patellar reflexes are 2+ on the right side and 2+ on the left side. Psychiatric:        Mood and Affect: Mood normal.     Physical Exam        Assessment & Plan:  Need for influenza vaccination -     Flu vaccine trivalent PF, 6mos and older(Flulaval,Afluria,Fluarix,Fluzone)  Intractable migraine with aura with status migrainosus Assessment & Plan: Controlled.  Continue sumatriptan  50 mg PRN. Refill provided.   Orders: -     SUMAtriptan  Succinate; Take 1 tablet at migraine onset. May repeat in 2 hours if headache persists or recurs.  Dispense: 9 tablet; Refill: 0  Gastroesophageal reflux disease, unspecified whether esophagitis present Assessment & Plan: Controlled.  Continue pantoprazole  40 mg daily.    Prediabetes Assessment & Plan: Repeat A1c pending  Orders: -     Lipid panel -     Hemoglobin A1c  Other specified hypothyroidism Assessment & Plan: She is taking levothyroxine  correctly.  Continue levothyroxine  75 mcg daily. Repeat TSH pending  Orders: -     TSH  Screening mammogram for breast cancer -     3D Screening Mammogram, Left and Right; Future  Idiopathic peripheral neuropathy Assessment & Plan: No concerns today.  Continue to monitor.   Weight loss Assessment & Plan: Improved with weights up to 103 pounds today! Following with GI   Urge incontinence Assessment & Plan: Controlled, no concerns today. Remain off oxybutynin .   PTSD (post-traumatic stress disorder) Assessment & Plan: Stable.  Following with psychiatry, office notes reviewed from October 2025.  Continue Abilify  2 mg daily, mirtazapine  30 mg daily.   Preventative health care Assessment & Plan: Immunizations UTD. Influenza vaccine provided today.  Mammogram due, orders placed. Colonoscopy UTD,  due 2029  Discussed the importance of a healthy diet and regular exercise in order for weight loss, and to reduce the risk of further co-morbidity.  Exam stable. Labs pending.  Follow up in 1 year for repeat physical.    Insomnia, unspecified type Assessment & Plan: Controlled.  Following with psychiatry, office notes reviewed from October 2025.  Continue mirtazapine  30 mg daily.   Mild episode of recurrent major depressive disorder Assessment & Plan: Stable.  Following with psychiatry, office notes reviewed from October 2025.  Continue Abilify  2 mg daily, mirtazapine  30 mg daily.   Anxiety Assessment & Plan: Stable.  Following with psychiatry, office notes reviewed from October 2025.  Continue Abilify  2 mg daily, mirtazapine  30 mg daily.     Assessment and Plan Assessment & Plan         Comer MARLA Gaskins, NP      History of Present Illness

## 2024-01-11 NOTE — Assessment & Plan Note (Signed)
 Stable.  Following with psychiatry, office notes reviewed from October 2025.  Continue Abilify  2 mg daily, mirtazapine  30 mg daily.

## 2024-01-11 NOTE — Patient Instructions (Signed)
 Stop by the lab prior to leaving today. I will notify you of your results once received.   [facility] to schedule your [facility].   It was a pleasure to see you today!

## 2024-01-11 NOTE — Assessment & Plan Note (Signed)
 Controlled, no concerns today. Remain off oxybutynin .

## 2024-01-11 NOTE — Assessment & Plan Note (Signed)
 Controlled.  Continue pantoprazole 40 mg daily.

## 2024-01-11 NOTE — Assessment & Plan Note (Signed)
 Immunizations UTD. Influenza vaccine provided today.  Mammogram due, orders placed. Colonoscopy UTD, due 2029  Discussed the importance of a healthy diet and regular exercise in order for weight loss, and to reduce the risk of further co-morbidity.  Exam stable. Labs pending.  Follow up in 1 year for repeat physical.

## 2024-01-11 NOTE — Assessment & Plan Note (Signed)
 Repeat A1c pending

## 2024-01-11 NOTE — Assessment & Plan Note (Addendum)
She is taking levothyroxine correctly.  Continue levothyroxine 75 mcg daily. Repeat TSH pending. 

## 2024-01-11 NOTE — Assessment & Plan Note (Signed)
 Improved with weights up to 103 pounds today! Following with GI

## 2024-01-11 NOTE — Assessment & Plan Note (Signed)
 Controlled.  Continue sumatriptan  50 mg PRN. Refill provided.

## 2024-01-12 LAB — TSH: TSH: 1.75 u[IU]/mL (ref 0.35–5.50)

## 2024-01-13 ENCOUNTER — Ambulatory Visit: Payer: Self-pay | Admitting: Primary Care

## 2024-01-13 NOTE — Telephone Encounter (Signed)
 Copied from CRM [id number]. Topic: Clinical - Lab/Test Results >> Jan 13, 2024 10:12 AM Asberry HERO wrote: Reason for CRM: Patient returned call to office, relayed info as provided.  Patient had no questions.

## 2024-01-27 DIAGNOSIS — K315 Obstruction of duodenum: Secondary | ICD-10-CM | POA: Diagnosis not present

## 2024-02-08 ENCOUNTER — Telehealth: Payer: Self-pay | Admitting: Surgery

## 2024-02-08 NOTE — Telephone Encounter (Signed)
 Patient has feeding tube and has appointment with Dr. Desiderio on 02/13/24 to have this possibly removed. She said that Dr. Desiderio wanted her to get up to 115 lbs.  Patient states as of this morning she weighed in at 102.8. Said she has been eating a lot and also increasing her sweets, but just can't seem to gain any more weight.  Needs to know if she should keep appointment?  Please call patient to advise.

## 2024-02-10 ENCOUNTER — Encounter: Payer: Self-pay | Admitting: Surgery

## 2024-02-10 DIAGNOSIS — K315 Obstruction of duodenum: Secondary | ICD-10-CM | POA: Diagnosis not present

## 2024-02-10 DIAGNOSIS — Z934 Other artificial openings of gastrointestinal tract status: Secondary | ICD-10-CM | POA: Diagnosis not present

## 2024-02-10 DIAGNOSIS — E43 Unspecified severe protein-calorie malnutrition: Secondary | ICD-10-CM | POA: Diagnosis not present

## 2024-02-13 ENCOUNTER — Ambulatory Visit: Admitting: Surgery

## 2024-02-17 DIAGNOSIS — Z934 Other artificial openings of gastrointestinal tract status: Secondary | ICD-10-CM | POA: Diagnosis not present

## 2024-02-17 DIAGNOSIS — E43 Unspecified severe protein-calorie malnutrition: Secondary | ICD-10-CM | POA: Diagnosis not present

## 2024-03-12 ENCOUNTER — Telehealth: Payer: Self-pay | Admitting: Surgery

## 2024-03-12 ENCOUNTER — Other Ambulatory Visit
Admission: RE | Admit: 2024-03-12 | Discharge: 2024-03-12 | Disposition: A | Discharge status: Home / Self Care | Attending: Surgery | Admitting: Surgery

## 2024-03-12 DIAGNOSIS — E46 Unspecified protein-calorie malnutrition: Secondary | ICD-10-CM | POA: Insufficient documentation

## 2024-03-12 LAB — FOLATE: Folate: >20 ng/mL

## 2024-03-12 LAB — ALBUMIN: Albumin: 4.4 g/dL (ref 3.5–5.0)

## 2024-03-12 LAB — IRON: Iron: 118 ug/dL (ref 28–170)

## 2024-03-12 LAB — VITAMIN D 25 HYDROXY (VIT D DEFICIENCY, FRACTURES): Vit D, 25-Hydroxy: 52.2 ng/mL (ref 30–100)

## 2024-03-12 NOTE — Telephone Encounter (Signed)
 Pt said that hse has an appt on this Friday and wants to [facility] if she needs to come in or not for her weight is 111 not 115lb. She did not know if she needs to keep this appt since she is not at that weight yet. Please advise pt. [phone]

## 2024-03-12 NOTE — Telephone Encounter (Signed)
 Per Dr Desiderio the patient should keep her appointment.

## 2024-03-13 LAB — ZINC: Zinc: 57 ug/dL (ref 44–115)

## 2024-03-13 LAB — COPPER, SERUM: Copper: 107 ug/dL (ref 80–158)

## 2024-03-14 LAB — VITAMIN E
Vitamin E (Alpha Tocopherol): 20 mg/L (ref 7.0–25.1)
Vitamin E(Gamma Tocopherol): 0.3 mg/L — ABNORMAL LOW (ref 0.5–5.5)

## 2024-03-14 LAB — VITAMIN B1: Vitamin B1 (Thiamine): 207.3 nmol/L — ABNORMAL HIGH (ref 66.5–200.0)

## 2024-03-14 LAB — VITAMIN A: Vitamin A (Retinoic Acid): 119.4 ug/dL — ABNORMAL HIGH (ref 20.1–62.0)

## 2024-03-16 ENCOUNTER — Encounter: Payer: Self-pay | Admitting: Surgery

## 2024-03-16 ENCOUNTER — Ambulatory Visit: Admitting: Surgery

## 2024-03-16 VITALS — BP 99/62 | HR 81 | Ht 60.0 in | Wt 111.8 lb

## 2024-03-16 DIAGNOSIS — E46 Unspecified protein-calorie malnutrition: Secondary | ICD-10-CM | POA: Diagnosis not present

## 2024-03-16 DIAGNOSIS — Z434 Encounter for attention to other artificial openings of digestive tract: Secondary | ICD-10-CM

## 2024-03-16 DIAGNOSIS — K9413 Enterostomy malfunction: Secondary | ICD-10-CM

## 2024-03-16 DIAGNOSIS — Z980 Intestinal bypass and anastomosis status: Secondary | ICD-10-CM

## 2024-03-16 NOTE — Patient Instructions (Addendum)
 All of your nutrition labs look good.   We will pause your tube feeds for now and recheck in one month to be sure you are maintaining your weight.  You may need to change your gauze tube dressing more often to keep moisture off of your skin.   Do flush your tube daily or more often with water  to keep it patient.    Follow-up with our office in one month.   Please call and ask to speak with a nurse if you develop questions or concerns.   High-Protein and High-Calorie Diet Eating high-protein and high-calorie foods can help you to gain weight, heal after an injury, and get better after an illness or surgery. The amount of daily protein and calories you need depends on: Your body weight. The reason you were told to follow this diet. Usually, a high-protein, high-calorie diet means that you should: Eat 250-500 extra calories each day. Make sure that you get enough of your daily calories from protein. Ask your health care provider how many of your calories should come from protein and how many calories total you need each day. Follow the diet as told by your provider. What are tips for following this plan? Reading food labels Check the nutrition facts label for calories, and grams of fat and protein. Items with more than 4 grams of protein are high-protein foods. General information  Ask your provider if you should take a nutritional supplement. Try to eat six small meals each day instead of three large meals. A goal is usually to eat every 2 to 3 hours. Eat a balanced diet. In each meal, include one food that's high in protein and one food with fat in it. Keep nutritious snacks available, such as nuts, trail mixes, dried fruit, and whole-milk yogurt. If you have kidney disease or diabetes, talk with your provider about how much protein is safe for you. Too much protein may put extra stress on your kidneys. Replace zero-calorie drinks with drinks that have calories in them, such as milk  and 100% fruit juice. Consider setting a timer to remind you to eat. You'll want to eat even if you do not feel very hungry. Preparing meals Milk and dairy foods. Add whole milk, half-and-half, or heavy cream to cereal, pudding, soup, or hot cocoa. Add whole milk to instant breakfast drinks. Add powdered milk to baked goods, smoothies, or milkshakes. Add powdered milk, cream, or butter to mashed potatoes. Replace water  with milk or heavy cream when making foods such as oatmeal, pudding, or cocoa. Make cream-based pastas and soups. Add cheese to cooked vegetables. Make whole-milk yogurt parfaits. Top them with granola, fruit, or nuts. Add cottage cheese to fruit. Add cream cheese to sandwiches or as a topping on crackers and bread. Eggs. Add hard-boiled eggs to salads. Keep hard-boiled eggs in the fridge to snack on. Add cheese to cooked eggs. Beans, nuts, and seeds. Add peanut butter to oatmeal or smoothies. Use peanut butter as a dip for fruits and vegetables or as a topping for pretzels, celery, or crackers. Add beans to casseroles, dips, and spreads. Add pureed beans to sauces and soups. Salads, soups, and other foods. Add avocado, cheese, or both to sandwiches or salads. Add avocado to smoothies. Add meat, poultry, or seafood to rice, pasta, casseroles, salads, and soups. Use mayonnaise when making egg salad, chicken salad, or tuna salad. Add oil or butter to cooked vegetables and grains. What high-protein foods should I eat?  Vegetables Soybeans. Peas.  Grains Quinoa. Bulgur wheat. Buckwheat. Meats and other proteins Beef, pork, and poultry. Fish and seafood. Eggs. Tofu. Textured vegetable protein (TVP). Peanut butter. Nuts and seeds. Dried beans. Protein powders. Hummus. Jerky. Dairy Whole milk. Whole-milk yogurt. Powdered milk. Cheese. Cottage cheese. Eggnog. Beverages High-protein supplement drinks. Soy milk. Other foods Protein bars. The items listed above may not be  all the foods and drinks you can have. Talk with an expert in healthy eating called a dietitian to learn more. What high-calorie foods should I eat? Fruits Dried fruit. Fruit leather. Canned fruit in syrup. Fruit juice. Avocado. Vegetables Vegetables cooked in oil or butter. Fried potatoes. Grains Pasta. Quick breads. Muffins. Pancakes. Granola. Meats and other proteins Peanut butter and other nut butters. Nuts and seeds. Dairy Heavy cream. Whipped cream. Cream cheese. Sour cream. Ice cream. Custard. Pudding. Whole-milk dairy products. Beverages Meal-replacement beverages. Nutrition shakes. Fruit juice. Seasonings and condiments Salad dressing. Mayonnaise. Alfredo sauce. Fruit preserves or jelly. Honey. Syrup. Sweets and desserts Cake. Cookies. Pie. Pastries. Candy bars. Chocolate. Fats and oils Butter or margarine. Oil. Gravy. Other foods Meal-replacement bars. The items listed above may not be all the foods and drinks you can have. Talk with an expert in healthy eating to learn more. This information is not intended to replace advice given to you by your health care provider. Make sure you discuss any questions you have with your health care provider. Document Revised: 07/19/2022 Document Reviewed: 07/19/2022 Elsevier Patient Education  2024 [employer].

## 2024-03-16 NOTE — Progress Notes (Signed)
 " 03/16/2024  History of Present Illness: Tanya Nguyen is a 59 y.o. female  s/p exploratory laparotomy, excision of prior loop gastrojejunostomy, and creating of new RNY type gastrojejunostoy with a feeding distal jejunostomy tube placement on 05/12/23.  She also required a venting G tube placed by IR on 05/30/23.    Patient continues to do well and continues to gain weight.  Her weight today is 111 pounds.  This is now within the range that was planned between 110 and 115 pounds before her tube feeds could be discontinued.  The patient denies any new issues.  She has been taking her multivitamin and calcium  supplements as instructed.  She continues with her tube feeds at nighttime.  Reports some intermittent discomfort around the tube insertion site.  Past Medical History: Past Medical History:  Diagnosis Date   Acquired pyloric stricture    Bipolar disorder (HCC)    Bleeding duodenal ulcer    CAP (community acquired pneumonia) 02/12/2020   Chronic esophagogastric ulcer    Chronic low back pain    Chronic pain syndrome    Coffee ground emesis 03/29/2023   Constipation 04/02/2014   Depression    Duodenal obstruction    Gastric outlet obstruction 01/19/2018   GERD (gastroesophageal reflux disease)    Hair loss 09/22/2018   Head injury 12/24/2022   History of stomach ulcers    Hypothyroidism    IDA (iron  deficiency anemia)    Incisional hernia, without obstruction or gangrene    Intractable vomiting    Kidney stones    Melena 05/08/2021   Migraine    Neuropathy    Nodule of middle lobe of right lung 09/17/2021   NSAID long-term use 09/16/2016   Pre-diabetes    Protein-calorie malnutrition, severe    PTSD (post-traumatic stress disorder)    Right-sided chest pain 09/17/2021   Sacroiliac joint pain 08/14/2018   Side pain 03/30/2018   Small bowel anastomotic dilation    Thrombocytosis    Upper GI bleed 05/18/2021   Vitamin B12 deficiency    Vitamin D  deficiency      Past  Surgical History: Past Surgical History:  Procedure Laterality Date   ABDOMINAL HYSTERECTOMY  03/08/2002   partial   bypass gastrojejunostomy     CHOLECYSTECTOMY     COLONOSCOPY WITH PROPOFOL  N/A 02/27/2018   Procedure: COLONOSCOPY WITH PROPOFOL ;  Surgeon: Jinny Carmine, MD;  Location: [facility];  Service: Endoscopy;  Laterality: N/A;   ESOPHAGOGASTRODUODENOSCOPY (EGD) WITH PROPOFOL  N/A 01/12/2018   Procedure: ESOPHAGOGASTRODUODENOSCOPY (EGD) WITH BIOPSIES;  Surgeon: Jinny Carmine, MD;  Location: [facility];  Service: Endoscopy;  Laterality: N/A;   ESOPHAGOGASTRODUODENOSCOPY (EGD) WITH PROPOFOL  N/A 01/25/2018   Procedure: ESOPHAGOGASTRODUODENOSCOPY (EGD) WITH PROPOFOL ;  Surgeon: Therisa Bi, MD;  Location: [facility];  Service: Gastroenterology;  Laterality: N/A;   ESOPHAGOGASTRODUODENOSCOPY (EGD) WITH PROPOFOL  N/A 02/27/2018   Procedure: ESOPHAGOGASTRODUODENOSCOPY (EGD) WITH PROPOFOL ;  Surgeon: Jinny Carmine, MD;  Location: [facility];  Service: Endoscopy;  Laterality: N/A;   ESOPHAGOGASTRODUODENOSCOPY (EGD) WITH PROPOFOL  N/A 05/09/2021   Procedure: ESOPHAGOGASTRODUODENOSCOPY (EGD) WITH PROPOFOL ;  Surgeon: Onita Elspeth Sharper, DO;  Location: [facility];  Service: Gastroenterology;  Laterality: N/A;   ESOPHAGOGASTRODUODENOSCOPY (EGD) WITH PROPOFOL  N/A 03/09/2022   Procedure: ESOPHAGOGASTRODUODENOSCOPY (EGD) WITH PROPOFOL ;  Surgeon: Jinny Carmine, MD;  Location: [facility];  Service: Endoscopy;  Laterality: N/A;   ESOPHAGOGASTRODUODENOSCOPY (EGD) WITH PROPOFOL  N/A 03/30/2023   Procedure: ESOPHAGOGASTRODUODENOSCOPY (EGD) WITH PROPOFOL ;  Surgeon: Jinny Carmine, MD;  Location: [facility];  Service: Endoscopy;  Laterality: N/A;   GASTROJEJUNOSTOMY N/A 05/12/2023   Procedure: GASTROJEJUNOSTOMY, revision, Arthea Platt, PA-C to assist, possible feeding jejunostomy;  Surgeon: Desiderio Schanz, MD;  Location: [facility];  Service: General;  Laterality: N/A;    INCISIONAL HERNIA REPAIR N/A 11/23/2018   Procedure: HERNIA REPAIR INCISIONAL;  Surgeon: Desiderio Schanz, MD;  Location: [facility];  Service: General;  Laterality: N/A;   IR GASTROSTOMY TUBE MOD SED  05/30/2023   IR REPLC DUODEN/JEJUNO TUBE PERCUT W/FLUORO  06/07/2023   IR REPLC DUODEN/JEJUNO TUBE PERCUT W/FLUORO  06/21/2023   IR REPLC GASTRO/COLONIC TUBE PERCUT W/FLUORO  06/17/2023   LAPAROTOMY N/A 01/20/2018   Procedure: EXPLORATORY LAPAROTOMY;  Surgeon: Desiderio Schanz, MD;  Location: [facility];  Service: General;  Laterality: N/A;   LYSIS OF ADHESION N/A 05/12/2023   Procedure: LYSIS OF ADHESION, open, Arthea Platt, PA-C to assist;  Surgeon: Desiderio Schanz, MD;  Location: [facility];  Service: General;  Laterality: N/A;   TOOTH EXTRACTION      Home Medications: Prior to Admission medications   Medication Sig Start Date End Date Taking? Authorizing Provider  acetaminophen  (TYLENOL ) 500 MG tablet Take 500-1,000 mg by mouth every 6 (six) hours as needed for moderate pain (pain score 4-6) or headache.   Yes [provider]  ARIPiprazole  (ABILIFY ) 2 MG tablet Take 1 tablet (2 mg total) by mouth at bedtime. 09/19/23 12/18/23 Yes Hisada, Katheren, MD  levothyroxine  (SYNTHROID ) 75 MCG tablet Take 1 tablet by mouth every morning on an empty stomach with water  only.  No food or other medications for 30 minutes. 07/18/23  Yes Clark, Katherine K, NP  mirtazapine  (REMERON ) 15 MG tablet Take 1 tablet (15 mg total) by mouth at bedtime. 08/31/23 11/29/23 Yes Hisada, Reina, MD  Multiple Vitamin (MULTIVITAMIN WITH MINERALS) TABS tablet Take 1 tablet by mouth daily. 04/01/23  Yes Fausto Burnard LABOR, DO  ondansetron  (ZOFRAN -ODT) 4 MG disintegrating tablet Take 1 tablet (4 mg total) by mouth every 8 (eight) hours as needed for nausea. 07/01/23  Yes Gretta Comer POUR, NP  oxybutynin  (DITROPAN -XL) 5 MG 24 hr tablet TAKE 1 TABLET (5 MG TOTAL) BY MOUTH AT BEDTIME. FOR BLADDER INCONTINENCE. 01/21/23  Yes Clark, Katherine K, NP   pantoprazole  (PROTONIX ) 40 MG tablet Take 1 tablet (40 mg total) by mouth daily. 05/04/23 04/28/24 Yes [place], PA-C  ramelteon  (ROZEREM ) 8 MG tablet Take 1 tablet (8 mg total) by mouth at bedtime as needed for sleep. 09/19/23 11/18/23 Yes Hisada, Katheren, MD  SUMAtriptan  (IMITREX ) 50 MG tablet Take 1 tablet at migraine onset. May repeat in 2 hours if headache persists or recurs. 03/08/23  Yes Clark, Katherine K, NP  traMADol  (ULTRAM ) 50 MG tablet Take 1 tablet (50 mg total) by mouth every 12 (twelve) hours as needed. Must last 30 days. 08/29/23  Yes Patel, Seema K, NP    Allergies: Allergies  Allergen Reactions   Penicillins Shortness Of Breath, Diarrhea and Nausea And Vomiting    TOLERATED CEPHALOSPORINS   Nsaids Other (See Comments)    Pt states NSAIDS cause internal bleeding   Gabapentin  Other (See Comments)    Hair Loss    Pregabalin      Tremors   Sulfa Antibiotics Nausea And Vomiting    Review of Systems: Review of Systems  Constitutional:  Negative for chills and fever.  Respiratory:  Negative for shortness of breath.   Cardiovascular:  Negative for chest pain.  Gastrointestinal:  Negative for abdominal pain, nausea and vomiting.    Physical Exam  BP 99/62   Pulse 81   Ht 5' (1.524 m)   Wt 111 lb 12.8 oz (50.7 kg)   SpO2 97%   BMI 21.83 kg/m  CONSTITUTIONAL: No acute distress HEENT:  Normocephalic, atraumatic, extraocular motion intact. RESPIRATORY:  Normal respiratory effort without pathologic use of accessory muscles. CARDIOVASCULAR: Regular rhythm and rate GI: The abdomen is soft, nondistended, non-tender to palpation.  J tube site has mild erythema where the silicone disc abuts the skin.  This may be related to some moisture changes versus some pressure from the disc itself. NEUROLOGIC:  Motor and sensation is grossly normal.  Cranial nerves are grossly intact. PSYCH:  Alert and oriented to person, place and time. Affect is normal.  Assessment and Plan: This  is a 59 y.o. female  s/p exlap, excision of prior loop gastrojejunostomy and creation of new RNY type gastrojejunostomy with feeding jejunostomy   -- Patient continues to do well and make improvements.  Her weight is finally now within the ideal range.  As such, I have discussed with the patient that we can do a trial of stopping her tube feeds.  Starting today, she can stop her tube feeds and we will continue this for the next month.  In 1 month, we will have her come back to see me and if her weight continues to maintain within the range of 110-115, then I would be able to remove her J-tube.  However if she notices during this month that her weight is decreasing, she should let us  know and resume her tube feeds. -- We recently repeated her nutritional labs including copper , zinc , vitamin A , vitamin D , vitamin EE, vitamin B1, vitamin B12, folate, iron , and albumin.  These have been all within normal to slightly elevated.  Overall she can continue with her multivitamin and calcium  supplementation and there is no need for any separate or additional supplementation. - Follow-up with me in 1 month.  I spent 20 minutes dedicated to the care of this patient on the date of this encounter to include pre-visit review of records, face-to-face time with the patient discussing diagnosis and management, and any post-visit coordination of care.   Aloysius Sheree Plant, MD [facility]        "

## 2024-03-22 ENCOUNTER — Ambulatory Visit (INDEPENDENT_AMBULATORY_CARE_PROVIDER_SITE_OTHER): Admitting: Psychiatry

## 2024-03-22 ENCOUNTER — Other Ambulatory Visit: Payer: Self-pay

## 2024-03-22 ENCOUNTER — Encounter: Payer: Self-pay | Admitting: Psychiatry

## 2024-03-22 VITALS — BP 110/70 | HR 91 | Temp 97.5°F | Ht 60.0 in | Wt 112.4 lb

## 2024-03-22 DIAGNOSIS — F330 Major depressive disorder, recurrent, mild: Secondary | ICD-10-CM | POA: Diagnosis not present

## 2024-03-22 DIAGNOSIS — G4700 Insomnia, unspecified: Secondary | ICD-10-CM | POA: Diagnosis not present

## 2024-03-22 DIAGNOSIS — F4310 Post-traumatic stress disorder, unspecified: Secondary | ICD-10-CM

## 2024-03-22 MED ORDER — ARIPIPRAZOLE 2 MG PO TABS: 2.0000 mg | ORAL_TABLET | Freq: Every day | ORAL | 1 refills | Status: AC

## 2024-03-22 MED ORDER — MIRTAZAPINE 30 MG PO TABS: 30.0000 mg | ORAL_TABLET | Freq: Every day | ORAL | 0 refills | Status: AC
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
## 2024-03-22 NOTE — Patient Instructions (Signed)
 Continue mirtazapine  30 mg at night  Continue Abilify  2 mg at night  Next appointment: 2/24 at 11 AM

## 2024-04-16 ENCOUNTER — Ambulatory Visit: Admitting: Surgery

## 2024-05-01 ENCOUNTER — Ambulatory Visit: Admitting: Psychiatry
